# Patient Record
Sex: Female | Born: 1965
Health system: Southern US, Community
[De-identification: ages and names within clinical notes are randomized; demographics above are authoritative.]

## PROBLEM LIST (undated history)

## (undated) DIAGNOSIS — M1990 Unspecified osteoarthritis, unspecified site: Secondary | ICD-10-CM

## (undated) DIAGNOSIS — J189 Pneumonia, unspecified organism: Secondary | ICD-10-CM

## (undated) DIAGNOSIS — J90 Pleural effusion, not elsewhere classified: Secondary | ICD-10-CM

## (undated) DIAGNOSIS — Z7189 Other specified counseling: Secondary | ICD-10-CM

## (undated) DIAGNOSIS — K219 Gastro-esophageal reflux disease without esophagitis: Secondary | ICD-10-CM

## (undated) DIAGNOSIS — C3492 Malignant neoplasm of unspecified part of left bronchus or lung: Secondary | ICD-10-CM

## (undated) DIAGNOSIS — K5900 Constipation, unspecified: Secondary | ICD-10-CM

## (undated) DIAGNOSIS — D649 Anemia, unspecified: Secondary | ICD-10-CM

## (undated) DIAGNOSIS — Z8719 Personal history of other diseases of the digestive system: Secondary | ICD-10-CM

## (undated) DIAGNOSIS — I10 Essential (primary) hypertension: Secondary | ICD-10-CM

## (undated) DIAGNOSIS — K5090 Crohn's disease, unspecified, without complications: Secondary | ICD-10-CM

## (undated) DIAGNOSIS — R0600 Dyspnea, unspecified: Secondary | ICD-10-CM

## (undated) HISTORY — PX: COLONOSCOPY: SHX174

## (undated) HISTORY — PX: ABDOMINAL HYSTERECTOMY: SHX81

## (undated) HISTORY — DX: Malignant neoplasm of unspecified part of left bronchus or lung: C34.92

## (undated) HISTORY — PX: BREAST EXCISIONAL BIOPSY: SUR124

## (undated) HISTORY — DX: Other specified counseling: Z71.89

---

## 1997-07-21 ENCOUNTER — Ambulatory Visit (HOSPITAL_COMMUNITY): Admission: RE | Admit: 1997-07-21 | Discharge: 1997-07-21 | Payer: Self-pay | Admitting: *Deleted

## 1998-12-17 ENCOUNTER — Other Ambulatory Visit: Admission: RE | Admit: 1998-12-17 | Discharge: 1998-12-17 | Payer: Self-pay | Admitting: *Deleted

## 2000-02-20 ENCOUNTER — Other Ambulatory Visit: Admission: RE | Admit: 2000-02-20 | Discharge: 2000-02-20 | Payer: Self-pay | Admitting: *Deleted

## 2000-07-11 ENCOUNTER — Emergency Department (HOSPITAL_COMMUNITY): Admission: EM | Admit: 2000-07-11 | Discharge: 2000-07-11 | Payer: Self-pay | Admitting: *Deleted

## 2000-10-22 ENCOUNTER — Ambulatory Visit (HOSPITAL_COMMUNITY): Admission: RE | Admit: 2000-10-22 | Discharge: 2000-10-22 | Payer: Self-pay | Admitting: Neurology

## 2001-06-14 ENCOUNTER — Other Ambulatory Visit: Admission: RE | Admit: 2001-06-14 | Discharge: 2001-06-14 | Payer: Self-pay | Admitting: *Deleted

## 2002-01-24 ENCOUNTER — Inpatient Hospital Stay (HOSPITAL_COMMUNITY): Admission: RE | Admit: 2002-01-24 | Discharge: 2002-01-28 | Payer: Self-pay | Admitting: *Deleted

## 2002-01-24 ENCOUNTER — Encounter (INDEPENDENT_AMBULATORY_CARE_PROVIDER_SITE_OTHER): Payer: Self-pay

## 2002-08-08 ENCOUNTER — Other Ambulatory Visit: Admission: RE | Admit: 2002-08-08 | Discharge: 2002-08-08 | Payer: Self-pay | Admitting: *Deleted

## 2003-03-10 ENCOUNTER — Ambulatory Visit (HOSPITAL_COMMUNITY): Admission: RE | Admit: 2003-03-10 | Discharge: 2003-03-10 | Payer: Self-pay | Admitting: Obstetrics and Gynecology

## 2004-01-09 ENCOUNTER — Emergency Department (HOSPITAL_COMMUNITY): Admission: EM | Admit: 2004-01-09 | Discharge: 2004-01-09 | Payer: Self-pay | Admitting: Emergency Medicine

## 2004-06-16 ENCOUNTER — Emergency Department (HOSPITAL_COMMUNITY): Admission: EM | Admit: 2004-06-16 | Discharge: 2004-06-16 | Payer: Self-pay | Admitting: Emergency Medicine

## 2005-10-12 ENCOUNTER — Encounter (INDEPENDENT_AMBULATORY_CARE_PROVIDER_SITE_OTHER): Payer: Self-pay | Admitting: *Deleted

## 2005-10-13 ENCOUNTER — Inpatient Hospital Stay (HOSPITAL_COMMUNITY): Admission: RE | Admit: 2005-10-13 | Discharge: 2005-10-17 | Payer: Self-pay | Admitting: Obstetrics and Gynecology

## 2007-05-17 ENCOUNTER — Emergency Department (HOSPITAL_COMMUNITY): Admission: EM | Admit: 2007-05-17 | Discharge: 2007-05-17 | Payer: Self-pay | Admitting: Emergency Medicine

## 2008-06-05 ENCOUNTER — Encounter: Admission: RE | Admit: 2008-06-05 | Discharge: 2008-06-05 | Payer: Self-pay | Admitting: Obstetrics and Gynecology

## 2008-06-08 ENCOUNTER — Encounter: Admission: RE | Admit: 2008-06-08 | Discharge: 2008-06-08 | Payer: Self-pay | Admitting: Obstetrics and Gynecology

## 2010-01-28 ENCOUNTER — Emergency Department (HOSPITAL_COMMUNITY)
Admission: EM | Admit: 2010-01-28 | Discharge: 2010-01-28 | Payer: Self-pay | Source: Home / Self Care | Admitting: Emergency Medicine

## 2010-02-16 ENCOUNTER — Encounter: Payer: Self-pay | Admitting: Obstetrics and Gynecology

## 2010-03-15 ENCOUNTER — Emergency Department (HOSPITAL_COMMUNITY): Payer: Self-pay

## 2010-03-15 ENCOUNTER — Emergency Department (HOSPITAL_COMMUNITY)
Admission: EM | Admit: 2010-03-15 | Discharge: 2010-03-15 | Disposition: A | Discharge status: Home / Self Care | Payer: Self-pay | Attending: Emergency Medicine | Admitting: Emergency Medicine

## 2010-03-15 ENCOUNTER — Encounter (HOSPITAL_COMMUNITY): Payer: Self-pay | Admitting: Radiology

## 2010-03-15 DIAGNOSIS — R51 Headache: Secondary | ICD-10-CM | POA: Insufficient documentation

## 2010-03-15 DIAGNOSIS — M542 Cervicalgia: Secondary | ICD-10-CM | POA: Insufficient documentation

## 2010-03-15 DIAGNOSIS — H53149 Visual discomfort, unspecified: Secondary | ICD-10-CM | POA: Insufficient documentation

## 2010-06-14 NOTE — Discharge Summary (Signed)
Nancy Moore, Nancy Moore                 ACCOUNT NO.:  [id number]   MEDICAL RECORD NO.:  [id number]          PATIENT TYPE:  INP   LOCATION:  9309                          FACILITY:  [facility]   PHYSICIAN:  Diona Foley, M.D.   DATE OF BIRTH:  [birth year]   DATE OF ADMISSION:  10/13/2005  DATE OF DISCHARGE:  10/17/2005                                 DISCHARGE SUMMARY   ADMISSION DIAGNOSIS:  Symptomatic uterine fibroids, endometriosis.   DISCHARGE DIAGNOSIS:  Symptomatic uterine fibroids, endometriosis, status  post total abdominal hysterectomy.   OPERATIVE FINDINGS:  Markedly enlarged uterus with multiple uterine fibroids  and serosal adhesions, normal appearing right tube and ovary, left tube and  ovary adherent to the cornua with very tortuous fallopian tube, normal  appearing cervix.   HISTORY OF PRESENT ILLNESS:  Please see admission history and physical for  details.  Briefly, this is a 45 year old African American female with a  history of symptomatic uterine fibroids, significant pelvic pain, and  dysmenorrhea.  The patient was counseled on her options for further  management and it was decided to proceed with definitive procedure.   HOSPITAL COURSE:  The patient underwent a total abdominal hysterectomy on  October 13, 2005, with the findings noted above.  Postoperatively, the  patient did very well.  Given the size of the uterus, there was a large  amount of blood loss during the procedure.  On postoperative day two, the  patient was symptomatic with light headedness  and dizziness and her  hemoglobin was 5.9.  The patient was subsequently counseled and consented  for blood transfusion and received 2 units of packed red blood cells.  She  continued to improve throughout her hospital stay.  On postoperative day  four, she was ambulating well without light headedness  or dizziness.  Her  pain was controlled with oral pain medications.  She was afebrile throughout  her  hospitalization.  She was passing flatus, voiding, and tolerating a  regular diet.  She was subsequently discharged to home on postoperative day  four in improved condition.   DISPOSITION:  To home.   CONDITION:  Improved.   LABORATORY DATA:  Preoperative hemoglobin on September 14 is 13, hematocrit  39.1, platelets 243, white count 8.6.  Postoperative day two, hemoglobin  5.9, hematocrit 17.6, platelets 193, white count 10.2.  Postoperative day  three, following a transfusion of 2 units, hemoglobin 7.9, hematocrit 23,  platelets 193, white count 10.3.  Sodium 136, potassium 3.3, chloride 103,  CO2 26, glucose 133, BUN 2, creatinine 0.8, calcium 7.9.  Urinalysis  negative.   FOLLOW UP:  The patient will follow up on Monday, September 24, to have her  staples removed in the office.  She will also follow up in four weeks for  routine postoperative visit.   DISCHARGE INSTRUCTIONS:  The patient is to call for a fever greater than  100.1, increasing abdominal pain, vomiting, vaginal bleeding, or any  drainage from her incision.   DISCHARGE MEDICATIONS:  Percocet 1-2 tabs p.o. q.4-6h. p.r.n. pain,  ibuprofen 800 mg p.o. q.8h. p.r.n.  pain, and ferrous sulfate 325 mg p.o.  b.i.d.      Diona Foley, M.D.  Electronically Signed     JW/MEDQ  D:  10/17/2005  T:  10/19/2005  Job:  [id number]

## 2010-06-14 NOTE — Procedures (Signed)
[facility]  Patient:    Nancy Moore, Nancy Moore Visit Number: [id number] MRN: [id number]          Service Type: OUT Location: [place] Attending Physician:  Blanch Media Dictated by:   Elvia Collum, M.D. Proc. Date: 10/22/00 Admit Date:  10/22/2000                             Procedure Report  DATE OF BIRTH:  1965/06/30.  PROCEDURE:  Diagnostic/therapeutic lumbar puncture.  INDICATION:  Tinnitus, suspected idiopathic intracranial hypertension.  OPERATOR:  Elvia Collum, M.D.  DESCRIPTION OF PROCEDURE:  Informed consent was obtained and signed after the indications, benefits, and risks of the procedure were discussed with the patient and she agreed to proceed.  The patient was prepped and draped in the usual sterile fashion after being placed in the right lateral decubitus position.  Local anesthesia was obtained with 2 cc of lidocaine.  A 20-gauge spinal needle was inserted into the L3-4 interspace and was advanced until clear CSF was obtained.  Opening pressure was measured and was 185 mmH2O.  CSF was gathered in four tubes and sent to the lab for the following analyses: Tube #1, cell count and differential; tube #2, protein and glucose; tube #3 and 4, hold.  Additional fluid was subsequently drained to bring the closing pressure down to 115 mmH2O.  The needle was withdrawn and hemostasis obtained. No immediate complications were noted.  The patient was advised to remain supine for an hour, at which point she would be discharged home, where she was advised to remain supine for the remainder of the afternoon.  She was also advised to call if she developed a headache or any other complications from the procedure.  She did report that after the procedure the pulsatile tinnitus sound in her ear had improved.  She is to call over the next week with a status report on this. Dictated by:   Elvia Collum, M.D. Attending Physician:  Blanch Media DD:  10/22/00 TD:  10/22/00 Job: [id number]

## 2010-06-14 NOTE — Op Note (Signed)
NAME:  NIKAYLA, MADARIS                           ACCOUNT NO.:  [id number]   MEDICAL RECORD NO.:  [id number]                   PATIENT TYPE:  INP   LOCATION:  X001                                 [facility]AC[facility]  [facility]   PHYSICIAN:  Freddie Apley, M.D.            DATE OF BIRTH:  December 21, 1965   DATE OF PROCEDURE:  01/24/2002  DATE OF DISCHARGE:                                 OPERATIVE REPORT   PREOPERATIVE DIAGNOSIS:  Enlarging uterine myomas, uterus 20 weeks in size.   POSTOPERATIVE DIAGNOSES:  1. Enlarging uterine myomas.  2. Adhesive disease of the sacrum to the posterior uterus and left adnexa.  3. Left tubal phimosis.   PROCEDURES:  1. Exploratory laparotomy.  2. Extensive lysis of adhesions.  3. Multiple myomectomy.   SURGEON:  Freddie Apley, M.D.   ASSISTANT:  Blair Dolphin. Rosana Hoes, M.D.   ANESTHESIA:  General endotracheal.   ESTIMATED BLOOD LOSS:  1000 cc.   URINE OUTPUT:  350 cc.   FLUIDS REPLACED:  5000 cc of crystalloid.   COMPLICATIONS:  None.   SPECIMENS:  Multiple uterine myomas.   INDICATION FOR PROCEDURE:  The patient is 45 years old.  She is gravida 1,  para 0.  She is recently married.  She is status post multiple myomectomy in  1996.  In May 2003 she was found to have significant growth of her uterine  myomas and there was evidence of a uterine submucosal myoma.  Repeat  sonogram shows an increase in the size of these uterine myomas over the last  six months, and her uterine size was 20 cm.  For this reason she was treated  with Lupron in preparation for the surgery over the last three months and is  here today for myomectomy.   FINDINGS:  There were no adhesions to the anterior surface of the uterus.  The posterior surface of the uterus shows adhesions from the sacrum and  rectosigmoid to the posterior uterine wall and to the left adnexa.  The left  tube was mildly phimotic and distorted onto the surface of the uterus.  The  ovary on this side was  normal.  The tube on the right looked normal and was  in normal position, and the ovary on that side also looked normal.  There  was no visible endometriosis today.   DESCRIPTION OF PROCEDURE:  The patient was brought to the operating room  with an IV in place.  She received 1 g of Ancef in the holding area.  PAS  stockings were placed thigh-high on her lower extremities.  She was placed  supine on the OR table and general anesthesia was administered with a single  intubation.  She was then placed in a frogleg position and the anterior  abdominal wall, perineum, and vagina were prepped with a solution of  Hibiclens.  A Foley catheter was sterilely inserted into the bladder.  A  bivalve speculum exposed the cervix, which was grasped with a single-tooth  tenaculum.  A #8 pediatric Foley was introduced into the endometrial canal  and the balloon was inflated to 3 cc.  The balloon was tested and felt to be  in place.  It was connected to an irrigation system with methylene blue so  that we could test the patency of our endometrium during the case.   The patient's previous Pfannenstiel incision was used for this case.  The  incision was infiltrated with 0.5% Marcaine.  The scar was not resected.  We  carried down through the scar to the subcutaneous tissues until the fascia  was encountered.  The fascia was then opened in a smile-like fashion to the  lateral extent of the incisions.  By grasping the anterior fascia, it was  able to be dissected free from the rectus muscles superiorly and inferiorly.  In an attempt to obtain a bigger incision, the fascia was opened even  further on the patient's left and a defect was created in the fascia.  This  was closed with 2-0 Vicryl sutures.  The rectus muscles were then divided in  the midline.  The peritoneum was tended and opened atraumatically.  There  was no evidence of ascites.  Inspecting the peritoneal cavity,  I found no  evidence of  endometriosis.  The patient's uterus was at this time about 16  weeks in size.  She had multiple uterine myomas, and it was very difficult  to lift the uterus out of the pelvis.  I was able to lift it anterior so  that I could see the adhesions, and these very filmy adhesions were taken  down with sharp dissection.  Where they approached the left fallopian tube,  great care was taken not to damage the fallopian tube, which was adherent to  the anterior surface of the uterine fundus.  Once these adhesions had been  taken down, a lap was placed between the posterior uterus and the sacrum to  lift the uterus slightly out of the incision.  An attempt was made to use a  Balfour retractor but this was not helpful, and so it was removed.   Dilute Pitressin solution was created using 10 mg in 30 cc.  This was  injected into the anterior surface of the uterus.  Bovie cautery using a  needle tip was used to open the midline of the uterus.  At the end of the  procedure had actually opened the entire anterior surface of the uterus, but  this first incision was really just in the top of the anterior fundus.  We  dissected down to the level that we could find myomas, and approximately  four myomas were removed through this incision.  One of these was  approximately 5 cm in size, the rest were smaller and embedded in the deeper  myometrium.  The largest myoma was in the lower uterine segment.  After  placing the Pitressin solution over this incision, the uterus was layered  back into the abdominal cavity and the Bovie was used to open this incision.  It was carried down to the layer of the myomas, and two myomas were removed  from this area.  Several smaller myomas could be felt between these two  areas and because I was unable to reach them through either of the separate  defects, the entire anterior wall was opened.  I was then able to retrieve three or four  more small uterine myomas using this site.   The large defects  were closed with interrupted 0 Vicryl sutures.   There was a strong impression that I was in the endometrial cavity but  despite the fact that we had placed a Foley catheter which was still in  place, no methylene blue was extruded.  Later when we took down her drapes,  the methylene blue had been saturating the coverlets beneath her buttocks.  Clearly, even though the Foley catheter was in the uterus, the dye did not  pass into the uterus.   The muscle edges were brought together with a running 0 Vicryl suture.  The  serosa over the underlying muscle was brought together with a 3-0 Monocryl.  Interceed was layered over the uterine incision.  We irrigated the  peritoneal cavity and removed the laps.  The fascia was then closed over the  uterine incision using 0 Vicryl suture running from side to side,  tying  both in the middle.  Subcutaneous tissues were irrigated,  skin staples were  applied.                                                Freddie Apley, M.D.    MAJ/MEDQ  D:  01/24/2002  T:  01/24/2002  Job:  [id number]   cc:   [place] B. Rosana Hoes, M.D.  [address]  [place]  [place] [address]  Fax: [phone]   [name], M.D.  [address] [place]  [place] [address]  Fax: [phone]

## 2010-06-14 NOTE — Discharge Summary (Signed)
NAME:  Nancy Moore, Nancy Moore                           ACCOUNT NO.:  [id number]   MEDICAL RECORD NO.:  [id number]                   PATIENT TYPE:  INP   LOCATION:  0448                                 FACILITY:  [facility]   PHYSICIAN:  Freddie Apley, M.D.            DATE OF BIRTH:  24-Apr-1965   DATE OF ADMISSION:  01/24/2002  DATE OF DISCHARGE:  01/28/2002                                 DISCHARGE SUMMARY   ADMISSION DIAGNOSIS:  Enlarging myomatous uterus.   DISCHARGE DIAGNOSES:  1. Enlarging myomatous uterus.  2. Anemia.   CONDITION AT THE TIME OF DISCHARGE:  Stable.   HISTORY OF PRESENT ILLNESS:  For details of the patient's admission history  and physical, please see the note transcribed and dated January 24, 2002.  Briefly, the patient is 45 years old.  She is a gravida 1 para 0.  She is  status post multiple myomectomies in 1996 and has been having an increased  size of her uterus with multiple uterine myomas recently.  These are  documented on sonogram to be over 20 cm.  She received Lupron to shrink the  fibroid and was brought to the hospital for an operative procedure to remove  these myomas.   HOSPITAL COURSE:  The patient was taken to the operating room on the day of  admission - January 24, 2002.  Under general anesthesia, exploratory  laparotomy and multiple myomectomies were performed.  The patient's  operation was complicated by extensive bleeding from the uterus where the  myomas were removed.  She was stable in the postoperative period with good  urine output and good vital signs.  However, her hemoglobin never responded  and even on postoperative day #3 was as low as 6.1.  She was symptomatic and  made the decision after counseling with her that she needed to be  transfused, and she received 2 units of blood on January 27, 2002.  On the  morning on January 2 she was stable and able to be discharged with  prescriptions for pain medication.   MEDICATIONS:  1.  Tylox one q.4h. or Darvocet one q.4h.  2. Peri-Colace for the days that she is on Tylox.  3. She has been given Chromagen which she will take also twice a day.    DISPOSITION:  She has an appointment scheduled in my office in one month and  she also has a discharge instruction sheet outlining what she should do if  she has any problems.                                               Freddie Apley, M.D.    MAJ/MEDQ  D:  01/28/2002  T:  01/28/2002  Job:  [id number]   cc:   [place]  Lysle Rubens, M.D.  [place] [place]  [place] [address]  Fax: [phone]

## 2010-06-14 NOTE — Op Note (Signed)
Nancy Moore, BHAT                 ACCOUNT NO.:  [id number]   MEDICAL RECORD NO.:  [id number]          PATIENT TYPE:  INP   LOCATION:  9309                          FACILITY:  [facility]   PHYSICIAN:  Diona Foley, M.D.   DATE OF BIRTH:  14-Sep-1965   DATE OF PROCEDURE:  10/13/2005  DATE OF DISCHARGE:                                 OPERATIVE REPORT   PREOPERATIVE DIAGNOSES:  1. Symptomatic uterine fibroids.  2. Endometriosis.  3. Menometrorrhagia.   POSTOPERATIVE DIAGNOSES:  1. Symptomatic uterine fibroids.  2. Endometriosis.  3. Menometrorrhagia.   PROCEDURE:  Total abdominal hysterectomy with left salpingo-oophorectomy and  lysis of adhesions and resection of endometriosis.   SURGEON:  Diona Foley, M.D.   ASSISTANT:  Freddie Apley, M.D.   COMPLICATIONS:  None.   ANESTHESIA:  General.   ESTIMATED BLOOD LOSS:  1200 mL.   OPERATIVE FINDINGS:  Markedly enlarged fibroid uterus, estimated weight 1600  g, normal appearing cervix, adhesions involving both adnexa with normal  appearing right ovary and fallopian tube, left hydrosalpinx, multiple  endometriosis implants in the posterior cul-de-sac along the rectosigmoid  and along the bladder, adhesions involving the colon to the uterine fundus,  peritoneal inclusion cyst.   SPECIMENS:  Uterus with adherent left tube and ovary, multiple fibroids,  cervix, peritoneum and peritoneal inclusion cyst all sent to pathology.   INDICATIONS:  This is a 45 year old gravida 1, para 0-0-1-0 African American  female who has a long history of symptomatic uterine fibroids with  significant menometrorrhagia as well as pelvic pain and infertility.  The  patient has undergone two prior myomectomies for her fibroids at which time  endometriosis was identified during one of her surgeries, a total of 30+  fibroids were removed.  The patient has been on and off Lupron throughout  the last 5+ years and her bleeding has only been partially  controlled with  birth control pills.  The patient has been evaluated for infertility with an  HSG showing multiple submucosal fibroids and she has undergone consultation  with reproductive endocrinology regarding assisted reproduction.  The  patient's symptoms have worsened to the point where she is now missing work  on occasion due to pain and bleeding and she presents today for definitive  management with hysterectomy.  The patient voices understanding that after  this procedure she will no longer be able to attempt childbearing.   Prior to procedure, the risks, benefits and alternatives of the procedure  have been discussed with the patient in detail.  We discussed the risks  which include, but are not limited to, hemorrhage requiring transfusion,  injury to the bowel, the bladder, the ureters or other organs which could  require additional surgery at the time of this procedure or in the future.  We discussed the risk of infection.  We discussed the risk of the deep  venous thrombosis, possible pulmonary embolus and anesthesia related  complications.  The patient voices understanding of all the above and  desires to proceed.  Informed consent has been obtained and all questions  answered before proceeding to  the OR.   PROCEDURE:  The patient was taken to the operating room where she was given  a general anesthetic.  She was then prepped and draped in the usual sterile  fashion and placed in the supine position.  A Foley catheter was placed.  Bimanual exam was performed which revealed a 16-week size uterus that was  laterally mobile with no posterior cul-de-sac nodularity.  Given this  finding, the decision was made to proceed through the patient's prior  Pfannenstiel incision which had been used twice previously.  The  Pfannenstiel skin incision was made with the scalpel after injecting 20 mL  of 0.25% plain Marcaine in the subcutaneous space.  This incision was  carried down sharply  to the fascia.  The fascia was then incised in the  midline and fascial incision was then extended laterally with Mayo scissors.  The patient had a prior hernia repair at the right aspect of her incision,  approximately 2 cm of mesh were encountered making the fascial incision.  Once the fascial incision was made, the superior and inferior aspects were  grasped with Kocher clamps, elevated and the underlying rectus muscles were  dissected off with both sharp and blunt dissection.  The muscles and  peritoneum were then separated in the midline using very careful sharp  dissection.  The peritoneal incision was then extended with good  visualization of the bladder.  A 2 cm peritoneal inclusion cyst was  encountered that appeared to be possibly adjacent to the urachus.  This was  excised and sent to pathology labeled as peritoneal cyst.  In an effort to  obtain adequate visualization and mobilization of the fibroid uterus, the  rectus muscles on both sides were transected for a distance of 1 cm.  At  this point the uterus was inspected.  A hand was then inserted behind the  uterus.  There was some filmy adhesions involving the bowel to the posterior  aspect of the fundus.  The right ovary and tube were able be visualized.  The right round ligament was then doubly suture ligated and was transected.  The utero-ovarian ligament on the right side was doubly clamped, transected  and was suture ligated with good hemostasis.  At this point, attention was  then turned to the left side.  There were multiple subserosal fibroids  varying from 1 to 3 cm in diameter present over the fundus and adherent to  these were thin adhesions adjacent to the bowel.  Using very careful sharp  and blunt dissection, these adhesions were transected and the uterus was  able to be more thoroughly manipulated.  Attention was then turned to the  left side where the round ligament was able to be identified.  The left fallopian  tube was markedly dilated and the ovary appeared to be wrapped  beneath the fallopian tube.  The left ovary could never actually be  identified due to the significant amount of adhesive disease involving the  left adnexa.  The left round ligament was then doubly ligated and transected  with good hemostasis.  Using very careful dissection technique, the  infundibulopelvic ligament on the left side was able to be identified.  This  was doubly clamped, transected and was suture ligated with excellent  hemostasis.  At this point, additional adhesiolysis was performed on the  posterior aspect of the fundus to help free the uterus.  Once the uterus was  mobile, it was able to be delivered up through the incision.  The uterine  arteries on both sides were carefully skeletonized and were doubly clamped,  transected and suture ligated with multiple bites.  Once the uterine  vasculature was secured, the uterus was then amputated at the level of the  cervix.  The uterus was then sent to pathology and was weighed in the  operating room, weighed approximately 1600 g.  The cervix was grasped with  two Kocher clamps.  The bladder, which had already been dissected and off of  the lower uterine segment and displaced inferiorly, was further dissected  off with sharp dissection.  On the posterior aspect and the uterosacral  ligaments, there were multiple endometrial nodules present and significant  scarring.  In order to access the uterosacral ligaments, these adhesions  were taken down with sharp dissection and the cardinal ligaments on both  sides were then clamped, transected and suture ligated and were hemostatic.  The uterosacral ligaments were then clamped, transected and suture ligated.  The vaginal cuff angles were then clamped, transected and suture ligated and  the cervix was then amputated with the curved hysterectomy scissors.  The  remaining portion of the vaginal cuff was closed with interrupted  figure-of-  eight Vicryl sutures and the cuff was hemostatic.  The pelvis, peritoneum  was carefully inspected and any areas of bleeding were cauterized with the  Bovie.  The rectosigmoid was carefully inspected and was intact.  There were  no evidence of a serosal defect.  Any areas of bleeding were carefully  cauterized with the Bovie.  The bladder was carefully inspected along the  bladder peritoneum.  There was a large amount of endometriosis present and  approximately 4 cm diameter piece of peritoneum was removed and sent to  pathology, labeled as peritoneum in an effort to excise the endometriosis.  The round ligaments, uterine pedicles, the right utero-ovarian and the left  infundibulopelvic ligament were carefully inspected and were hemostatic.  The ureters were noted to be coursing normally and of normal caliber on both  sides.  The bowel was then run and there was no evidence of any compromise  or injury to the bowel.  The pelvis was then irrigated copiously with warm normal saline.  Any areas of bleeding were cauterized with the Bovie.  Gelfoam and thrombin were placed over the vaginal cuff and over the area  where the peritoneum had been resected for additional hemostasis.  At this  point all sponge, lap and instrument counts were correct x1.  The rectus  muscles were inspected and any areas of bleeding were hemostatic.  The  subfascial areas were inspected and areas of bleeding were hemostatic.  The  fascial incision was then closed with a running PDS suture, except at the  right angle which was closed with 0 Prolene along the area of the previously  placed mesh.  The subcutaneous space was then irrigated.  Any areas of  bleeding were cauterized with the Bovie.  The skin was then closed with  staples.   The patient tolerated the procedure very well.  All sponge, lap, needle and  instrument counts were correct x2.  The patient was taken to the recovery  room awake and in  stable condition.  There were no complications.      Diona Foley, M.D.  Electronically Signed     JW/MEDQ  D:  10/13/2005  T:  10/14/2005  Job:  [id number]

## 2010-07-22 ENCOUNTER — Inpatient Hospital Stay (INDEPENDENT_AMBULATORY_CARE_PROVIDER_SITE_OTHER)
Admission: RE | Admit: 2010-07-22 | Discharge: 2010-07-22 | Disposition: A | Discharge status: Home / Self Care | Payer: 59 | Source: Ambulatory Visit | Attending: Emergency Medicine | Admitting: Emergency Medicine

## 2010-07-22 DIAGNOSIS — L0292 Furuncle, unspecified: Secondary | ICD-10-CM

## 2010-07-22 DIAGNOSIS — R609 Edema, unspecified: Secondary | ICD-10-CM

## 2010-07-22 LAB — POCT URINALYSIS DIP (DEVICE)
Bilirubin Urine: NEGATIVE
Glucose, UA: NEGATIVE mg/dL
Hgb urine dipstick: NEGATIVE
Ketones, ur: NEGATIVE mg/dL
Leukocytes, UA: NEGATIVE
Nitrite: NEGATIVE
Protein, ur: NEGATIVE mg/dL
Specific Gravity, Urine: 1.02 (ref 1.005–1.030)
Urobilinogen, UA: 1 mg/dL (ref 0.0–1.0)
pH: 5 (ref 5.0–8.0)

## 2010-07-22 LAB — POCT I-STAT, CHEM 8
BUN: 8 mg/dL (ref 6–23)
Calcium, Ion: 1.23 mmol/L (ref 1.12–1.32)
Chloride: 99 mEq/L (ref 96–112)
Creatinine, Ser: 0.9 mg/dL (ref 0.50–1.10)
Glucose, Bld: 90 mg/dL (ref 70–99)
HCT: 43 % (ref 36.0–46.0)
Hemoglobin: 14.6 g/dL (ref 12.0–15.0)
Potassium: 3.9 mEq/L (ref 3.5–5.1)
Sodium: 140 mEq/L (ref 135–145)
TCO2: 30 mmol/L (ref 0–100)

## 2011-01-01 ENCOUNTER — Other Ambulatory Visit: Payer: Self-pay | Admitting: Internal Medicine

## 2011-01-01 DIAGNOSIS — Z1231 Encounter for screening mammogram for malignant neoplasm of breast: Secondary | ICD-10-CM

## 2011-02-03 ENCOUNTER — Ambulatory Visit: Payer: 59

## 2011-02-21 ENCOUNTER — Ambulatory Visit: Payer: 59

## 2011-05-30 ENCOUNTER — Ambulatory Visit: Payer: 59

## 2011-09-15 ENCOUNTER — Ambulatory Visit: Payer: 59

## 2011-09-26 ENCOUNTER — Ambulatory Visit: Payer: 59

## 2011-09-30 ENCOUNTER — Emergency Department (HOSPITAL_COMMUNITY)
Admission: EM | Admit: 2011-09-30 | Discharge: 2011-09-30 | Disposition: A | Discharge status: Home / Self Care | Payer: 59 | Source: Home / Self Care | Attending: Family Medicine | Admitting: Family Medicine

## 2011-09-30 ENCOUNTER — Encounter (HOSPITAL_COMMUNITY): Payer: Self-pay | Admitting: *Deleted

## 2011-09-30 DIAGNOSIS — J029 Acute pharyngitis, unspecified: Secondary | ICD-10-CM

## 2011-09-30 HISTORY — DX: Essential (primary) hypertension: I10

## 2011-09-30 LAB — POCT RAPID STREP A: Streptococcus, Group A Screen (Direct): NEGATIVE

## 2011-09-30 NOTE — ED Notes (Signed)
Pt reports sore throat and possible exposure to strep throat since Sunday. All over body aches and general malaise.

## 2011-09-30 NOTE — ED Provider Notes (Signed)
History     CSN: [id number]  Arrival date & time 09/30/11  1750   First MD Initiated Contact with Patient 09/30/11 1759      Chief Complaint  Patient presents with  . Sore Throat    (Consider location/radiation/quality/duration/timing/severity/associated sxs/prior treatment) Patient is a 46 y.o. female presenting with pharyngitis. The history is provided by the patient.  Sore Throat This is a new problem. The current episode started more than 2 days ago. The problem has not changed since onset.The symptoms are aggravated by swallowing.    Past Medical History  Diagnosis Date  . Hypertension     History reviewed. No pertinent past surgical history.  Family History  Problem Relation Age of Onset  . Family history unknown: Yes    History  Substance Use Topics  . Smoking status: Never Smoker   . Smokeless tobacco: Not on file  . Alcohol Use: Yes     socially    OB History    Grav Para Term Preterm Abortions TAB SAB Ect Mult Living                  Review of Systems  Constitutional: Negative.   HENT: Positive for sore throat. Negative for congestion, rhinorrhea and postnasal drip.   Respiratory: Negative for cough.   Skin: Negative.     Allergies  Percocet  Home Medications   Current Outpatient Rx  Name Route Sig Dispense Refill  . VALSARTAN 40 MG PO TABS Oral Take by mouth daily.      BP 145/76  Pulse 78  Temp 97.8 F (36.6 C) (Oral)  Resp 18  SpO2 100%  LMP 03/15/2010  Physical Exam  Nursing note and vitals reviewed. Constitutional: She is oriented to person, place, and time. She appears well-developed and well-nourished.  HENT:  Head: Normocephalic.  Right Ear: External ear normal.  Left Ear: External ear normal.  Mouth/Throat: Oropharynx is clear and moist.  Neck: Normal range of motion. Neck supple.  Cardiovascular: Regular rhythm and normal heart sounds.   Pulmonary/Chest: Effort normal and breath sounds normal.  Lymphadenopathy:   She has cervical adenopathy.  Neurological: She is alert and oriented to person, place, and time.  Skin: Skin is warm and dry.    ED Course  Procedures (including critical care time)   Labs Reviewed  POCT RAPID STREP A ([facility] ONLY)   No results found.   1. Pharyngitis, acute       MDM          Linna Hoff, MD 09/30/11 1901

## 2011-11-17 ENCOUNTER — Emergency Department (INDEPENDENT_AMBULATORY_CARE_PROVIDER_SITE_OTHER)
Admission: EM | Admit: 2011-11-17 | Discharge: 2011-11-17 | Disposition: A | Discharge status: Home / Self Care | Payer: Self-pay | Source: Home / Self Care | Attending: Emergency Medicine | Admitting: Emergency Medicine

## 2011-11-17 ENCOUNTER — Encounter (HOSPITAL_COMMUNITY): Payer: Self-pay | Admitting: *Deleted

## 2011-11-17 DIAGNOSIS — S139XXA Sprain of joints and ligaments of unspecified parts of neck, initial encounter: Secondary | ICD-10-CM

## 2011-11-17 MED ORDER — CYCLOBENZAPRINE HCL 10 MG PO TABS: 10.0000 mg | ORAL_TABLET | Freq: Two times a day (BID) | ORAL | Status: DC | PRN

## 2011-11-17 MED ORDER — IBUPROFEN 800 MG PO TABS: 800.0000 mg | ORAL_TABLET | Freq: Three times a day (TID) | ORAL | Status: AC | PRN
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
## 2012-01-13 ENCOUNTER — Other Ambulatory Visit: Payer: Self-pay | Admitting: Internal Medicine

## 2012-01-13 DIAGNOSIS — Z1231 Encounter for screening mammogram for malignant neoplasm of breast: Secondary | ICD-10-CM

## 2012-02-16 ENCOUNTER — Ambulatory Visit: Payer: Self-pay

## 2012-02-24 ENCOUNTER — Ambulatory Visit
Admission: RE | Admit: 2012-02-24 | Discharge: 2012-02-24 | Disposition: A | Discharge status: Home / Self Care | Payer: 59 | Source: Ambulatory Visit | Attending: Internal Medicine | Admitting: Internal Medicine

## 2012-02-24 DIAGNOSIS — Z1231 Encounter for screening mammogram for malignant neoplasm of breast: Secondary | ICD-10-CM

## 2012-03-05 ENCOUNTER — Ambulatory Visit: Payer: 59

## 2012-09-01 ENCOUNTER — Other Ambulatory Visit: Payer: Self-pay | Admitting: Internal Medicine

## 2012-09-01 ENCOUNTER — Ambulatory Visit
Admission: RE | Admit: 2012-09-01 | Discharge: 2012-09-01 | Disposition: A | Discharge status: Home / Self Care | Payer: 59 | Source: Ambulatory Visit | Attending: Internal Medicine | Admitting: Internal Medicine

## 2012-09-01 ENCOUNTER — Ambulatory Visit: Admission: RE | Admit: 2012-09-01 | Payer: 59 | Source: Ambulatory Visit

## 2012-09-01 DIAGNOSIS — Z1231 Encounter for screening mammogram for malignant neoplasm of breast: Secondary | ICD-10-CM

## 2012-11-27 ENCOUNTER — Encounter: Payer: 59 | Attending: Internal Medicine | Admitting: *Deleted

## 2012-11-27 VITALS — Ht 66.0 in | Wt 215.1 lb

## 2012-11-27 DIAGNOSIS — Z713 Dietary counseling and surveillance: Secondary | ICD-10-CM | POA: Insufficient documentation

## 2012-11-27 NOTE — Progress Notes (Signed)
Subjective:     Patient ID: Nancy Moore, female   DOB: 1965/12/22, 47 y.o.   MRN: [id number]  HPI   Review of Systems    Objective:    Physical Exam  Patient was seen on 11/27/2012 for the [facility] Weight Loss Class at the [facility]. The following learning objectives were met by the patient during this class:   Describe healthy choices in each food group  Describe portion size of foods  Use plate method for meal planning  Demonstrate how to read Nutrition Facts food label  Set realistic goals for weight loss, diet changes, and physical activity.

## 2013-03-17 ENCOUNTER — Other Ambulatory Visit: Payer: Self-pay | Admitting: Internal Medicine

## 2013-03-17 DIAGNOSIS — R109 Unspecified abdominal pain: Secondary | ICD-10-CM

## 2013-03-29 ENCOUNTER — Other Ambulatory Visit (HOSPITAL_COMMUNITY): Payer: Self-pay | Admitting: Internal Medicine

## 2013-03-29 DIAGNOSIS — R109 Unspecified abdominal pain: Secondary | ICD-10-CM

## 2013-03-31 ENCOUNTER — Ambulatory Visit (HOSPITAL_COMMUNITY)
Admission: RE | Admit: 2013-03-31 | Discharge: 2013-03-31 | Disposition: A | Discharge status: Home / Self Care | Payer: 59 | Source: Ambulatory Visit | Attending: Internal Medicine | Admitting: Internal Medicine

## 2013-03-31 ENCOUNTER — Encounter (HOSPITAL_COMMUNITY): Payer: Self-pay

## 2013-03-31 ENCOUNTER — Other Ambulatory Visit: Payer: Self-pay

## 2013-03-31 DIAGNOSIS — M549 Dorsalgia, unspecified: Secondary | ICD-10-CM | POA: Insufficient documentation

## 2013-03-31 DIAGNOSIS — R109 Unspecified abdominal pain: Secondary | ICD-10-CM

## 2013-03-31 DIAGNOSIS — R1031 Right lower quadrant pain: Secondary | ICD-10-CM | POA: Insufficient documentation

## 2013-03-31 MED ORDER — IOHEXOL 300 MG/ML  SOLN
100.0000 mL | Freq: Once | INTRAMUSCULAR | Status: AC | PRN
  Administered 2013-03-31: 100 mL via INTRAVENOUS

## 2014-05-17 ENCOUNTER — Other Ambulatory Visit: Payer: Self-pay | Admitting: Internal Medicine

## 2014-05-17 ENCOUNTER — Ambulatory Visit
Admission: RE | Admit: 2014-05-17 | Discharge: 2014-05-17 | Disposition: A | Discharge status: Home / Self Care | Payer: 59 | Source: Ambulatory Visit | Attending: Internal Medicine | Admitting: Internal Medicine

## 2014-05-17 DIAGNOSIS — M79671 Pain in right foot: Secondary | ICD-10-CM

## 2014-06-21 ENCOUNTER — Ambulatory Visit: Payer: 59 | Admitting: Podiatry

## 2014-09-16 ENCOUNTER — Encounter: Payer: 59 | Attending: Internal Medicine | Admitting: Dietician

## 2014-09-16 DIAGNOSIS — Z713 Dietary counseling and surveillance: Secondary | ICD-10-CM | POA: Diagnosis present

## 2014-09-16 NOTE — Progress Notes (Signed)
Patient was seen on 09/16/14 for the Weight Loss Class at the [facility]. The following learning objectives were met by the patient during this class:   Describe healthy choices in each food group  Describe portion size of foods  Use plate method for meal planning  Demonstrate how to read Nutrition Facts food label  Set realistic goals for weight loss, diet changes, and physical activity.   Goals:  1. Make healthy food choices in each food group.  2. Reduce portion size of foods.  3. Increase fruit and vegetable intake.  4. Use plate method for meal planning.  5. Increase physical activity.    Handouts given:   1. Nutrition Strategies for Weight Loss   2. Meal plan/portion card   3. MyPlate Planner   4. Weight Management Recipe Resources   5. Bake, Broil, Grill   

## 2015-03-01 MED FILL — VALSARTAN 40 MG TABLET: 40 | 30 days supply | Qty: 30 | Fill #6

## 2015-04-18 MED FILL — VALSARTAN 40 MG TABLET: 40 | 30 days supply | Qty: 30 | Fill #1

## 2015-05-21 MED FILL — VALSARTAN 40 MG TABLET: 40 | 30 days supply | Qty: 30 | Fill #2

## 2015-06-29 MED FILL — VALSARTAN 40 MG TABLET: 40 | 30 days supply | Qty: 30 | Fill #3

## 2015-06-29 MED FILL — PHENTERMINE 37.5 MG TABLET: 37.5 | 30 days supply | Qty: 30 | Fill #0

## 2015-08-03 MED FILL — VALSARTAN 40 MG TABLET: 40 | 30 days supply | Qty: 30 | Fill #4

## 2015-09-20 MED FILL — VALSARTAN 40 MG TABLET: 40 | 30 days supply | Qty: 30 | Fill #5

## 2015-11-13 MED FILL — VALSARTAN 40 MG TABLET: 40 | 30 days supply | Qty: 30 | Fill #0

## 2015-11-13 MED FILL — HYDROCHLOROTHIAZIDE 12.5 MG: 12.5 | 30 days supply | Qty: 30 | Fill #0

## 2015-11-13 MED FILL — FLUTICASONE PROP 50 MCG SPR: 50 | 30 days supply | Qty: 16 | Fill #0

## 2015-11-13 MED FILL — MOMETASONE FUROATE 0.1% SOL: 0.1 | 30 days supply | Qty: 30 | Fill #0

## 2015-12-18 MED FILL — HYDROCHLOROTHIAZIDE 12.5 MG: 12.5 | 30 days supply | Qty: 30 | Fill #1

## 2015-12-19 MED FILL — VALSARTAN 40 MG TABLET: 40 | 30 days supply | Qty: 30 | Fill #1

## 2016-01-24 MED FILL — HYDROCHLOROTHIAZIDE 12.5 MG: 12.5 | 30 days supply | Qty: 30 | Fill #2

## 2016-01-25 MED FILL — PHENTERMINE 30 MG CAPSULE: 30 | 30 days supply | Qty: 30 | Fill #0

## 2016-01-25 MED FILL — VALSARTAN 40 MG TABLET: 40 | 30 days supply | Qty: 30 | Fill #2

## 2016-02-29 MED FILL — HYDROCHLOROTHIAZIDE 12.5 MG: 12.5 | 30 days supply | Qty: 30 | Fill #3

## 2016-02-29 MED FILL — VALSARTAN 40 MG TABLET: 40 | 30 days supply | Qty: 30 | Fill #3

## 2016-03-31 MED FILL — VALSARTAN 40 MG TABLET: 40 | 30 days supply | Qty: 30 | Fill #4

## 2016-03-31 MED FILL — HYDROCHLOROTHIAZIDE 12.5 MG: 12.5 | 30 days supply | Qty: 30 | Fill #4

## 2016-04-30 MED FILL — PHENTERMINE HCL 30 MG CAP: 30 | 30 days supply | Qty: 30 | Fill #1

## 2016-04-30 MED FILL — HYDROCHLOROTHIAZIDE 12.5 MG: 12.5 | 30 days supply | Qty: 30 | Fill #5

## 2016-04-30 MED FILL — VALSARTAN 40 MG TABLET: 40 | 30 days supply | Qty: 30 | Fill #5

## 2016-05-27 MED FILL — HYDROCHLOROTHIAZIDE 12.5 MG: 12.5 | 30 days supply | Qty: 30 | Fill #0

## 2016-05-27 MED FILL — VALSARTAN 40 MG TABLET: 40 | 30 days supply | Qty: 30 | Fill #0

## 2016-05-27 MED FILL — SCOPOLAMINE 1 MG/3 DAY PATC: 1 | 9 days supply | Qty: 3 | Fill #0

## 2016-05-30 MED FILL — PHENTERMINE 30 MG CAPSULE: 30 | 30 days supply | Qty: 30 | Fill #2

## 2016-07-11 MED FILL — HYDROCHLOROTHIAZIDE 12.5 MG: 12.5 | 30 days supply | Qty: 30 | Fill #1

## 2016-07-11 MED FILL — VALSARTAN 40 MG TABLET: 40 | 30 days supply | Qty: 30 | Fill #1

## 2016-08-06 MED FILL — LEVOCETIRIZINE 5 MG TABLET: 5 | 30 days supply | Qty: 30 | Fill #0

## 2016-08-06 MED FILL — VALSARTAN 40 MG TABLET: 40 | 30 days supply | Qty: 30 | Fill #2

## 2016-08-06 MED FILL — HYDROCHLOROTHIAZIDE 12.5 MG: 12.5 | 30 days supply | Qty: 30 | Fill #2

## 2016-08-07 ENCOUNTER — Other Ambulatory Visit: Payer: Self-pay | Admitting: Internal Medicine

## 2016-08-07 DIAGNOSIS — Z1231 Encounter for screening mammogram for malignant neoplasm of breast: Secondary | ICD-10-CM

## 2016-08-25 ENCOUNTER — Ambulatory Visit
Admission: RE | Admit: 2016-08-25 | Discharge: 2016-08-25 | Disposition: A | Discharge status: Home / Self Care | Payer: Self-pay | Source: Ambulatory Visit | Attending: Internal Medicine | Admitting: Internal Medicine

## 2016-08-25 DIAGNOSIS — Z1231 Encounter for screening mammogram for malignant neoplasm of breast: Secondary | ICD-10-CM

## 2016-08-26 ENCOUNTER — Ambulatory Visit: Payer: Self-pay

## 2016-09-19 MED FILL — LEVOCETIRIZINE 5 MG TABLET: 5 | 30 days supply | Qty: 30 | Fill #1

## 2016-09-19 MED FILL — HYDROCHLOROTHIAZIDE 12.5 MG: 12.5 | 30 days supply | Qty: 30 | Fill #3 | Status: TO

## 2016-09-19 MED FILL — VALSARTAN 40 MG TABLET: 40 | 30 days supply | Qty: 30 | Fill #3

## 2016-09-24 MED FILL — BENZONATATE 200 MG CAPSULE: 200 | 10 days supply | Qty: 30 | Fill #0

## 2016-10-08 ENCOUNTER — Other Ambulatory Visit: Payer: Self-pay | Admitting: Internal Medicine

## 2016-10-08 ENCOUNTER — Ambulatory Visit
Admission: RE | Admit: 2016-10-08 | Discharge: 2016-10-08 | Disposition: A | Discharge status: Home / Self Care | Payer: 59 | Source: Ambulatory Visit | Attending: Internal Medicine | Admitting: Internal Medicine

## 2016-10-08 DIAGNOSIS — R059 Cough, unspecified: Secondary | ICD-10-CM

## 2016-10-08 DIAGNOSIS — R9389 Abnormal findings on diagnostic imaging of other specified body structures: Secondary | ICD-10-CM

## 2016-10-08 DIAGNOSIS — R05 Cough: Secondary | ICD-10-CM

## 2016-10-08 MED FILL — LOSARTAN POTASSIUM 25 MG TA: 25 | 31 days supply | Qty: 31 | Fill #0

## 2016-10-10 ENCOUNTER — Ambulatory Visit
Admission: RE | Admit: 2016-10-10 | Discharge: 2016-10-10 | Disposition: A | Discharge status: Home / Self Care | Payer: 59 | Source: Ambulatory Visit | Attending: Internal Medicine | Admitting: Internal Medicine

## 2016-10-10 ENCOUNTER — Telehealth: Payer: Self-pay | Admitting: Internal Medicine

## 2016-10-10 ENCOUNTER — Other Ambulatory Visit: Payer: 59

## 2016-10-10 ENCOUNTER — Other Ambulatory Visit: Payer: Self-pay | Admitting: Internal Medicine

## 2016-10-10 DIAGNOSIS — R911 Solitary pulmonary nodule: Secondary | ICD-10-CM

## 2016-10-10 DIAGNOSIS — R9389 Abnormal findings on diagnostic imaging of other specified body structures: Secondary | ICD-10-CM

## 2016-10-10 MED ORDER — IOPAMIDOL (ISOVUE-300) INJECTION 61%
75.0000 mL | Freq: Once | INTRAVENOUS | Status: AC | PRN
  Administered 2016-10-10: 75 mL via INTRAVENOUS

## 2016-10-10 NOTE — Telephone Encounter (Signed)
Dr Lysle Rubens called re chronic cough with miliary pattern on cxr   rec sputum for afb if productive, Quant TB, crypto and histo serologies and hiv Stay home until we have afb back and then FOB vs CT bx of largest nodule in LLL sup segment   Triage:  Be sure above is done and come to office with mask on this week ok to add to my schedule for Wed 130  9/19 if no one else can see her sooner

## 2016-10-13 ENCOUNTER — Telehealth: Payer: Self-pay | Admitting: Pulmonary Disease

## 2016-10-13 NOTE — Telephone Encounter (Signed)
I called back Karly at Dr. Lorenda Hatchet office but no answer. I left a detailed message stating I would call her back after I got the ok from [facility]. Please advise if we can move her up sooner. Thanks.

## 2016-10-13 NOTE — Telephone Encounter (Signed)
Spoke with Freada Bergeron at Dr [facility] office and give pt appt time 10/15/16 1:30, check in at 1:10 and bring all meds with her to appt.  She verbalized understanding and will contact pt with appt details.  Nothing further needed.

## 2016-10-13 NOTE — Telephone Encounter (Signed)
Spoke with Karly at Dr Husein's office and give pt appt time 10/15/16 1:30, check in at 1:10 and bring all meds with her to appt.  She verbalized understanding and will contact pt with appt details.  Nothing further needed.  

## 2016-10-14 ENCOUNTER — Telehealth: Payer: Self-pay | Admitting: Pulmonary Disease

## 2016-10-14 MED FILL — HYDROCHLOROTHIAZIDE 12.5 MG: 12.5 | 30 days supply | Qty: 30 | Fill #0

## 2016-10-14 MED FILL — BENZONATATE 200 MG CAPSULE: 200 | 10 days supply | Qty: 30 | Fill #0

## 2016-10-14 NOTE — Telephone Encounter (Signed)
Bring her in at 1pm but make sure she wears a mask and we put her right back in a room to complete her paperwork as she has a relatively high risk of TB

## 2016-10-14 NOTE — Telephone Encounter (Signed)
MW pt was never added to your schedule for her appt with you on 9/19 at 1:30.  Is it ok to double book you at 1:30>  Thanks

## 2016-10-14 NOTE — Telephone Encounter (Signed)
Pt has been scheduled with MW tomorrow at 130. Advised to get here early. She verbalized understanding. Nothing else needed at time of call.

## 2016-10-15 ENCOUNTER — Ambulatory Visit (INDEPENDENT_AMBULATORY_CARE_PROVIDER_SITE_OTHER): Payer: 59 | Admitting: Internal Medicine

## 2016-10-15 ENCOUNTER — Encounter: Payer: Self-pay | Admitting: Internal Medicine

## 2016-10-15 VITALS — BP 132/84 | HR 74 | Temp 98.9°F | Ht 66.0 in | Wt 233.0 lb

## 2016-10-15 DIAGNOSIS — R918 Other nonspecific abnormal finding of lung field: Secondary | ICD-10-CM

## 2016-10-15 DIAGNOSIS — R05 Cough: Secondary | ICD-10-CM | POA: Diagnosis not present

## 2016-10-15 DIAGNOSIS — R058 Other specified cough: Secondary | ICD-10-CM

## 2016-10-15 MED ORDER — PANTOPRAZOLE SODIUM 40 MG PO TBEC: 40.0000 mg | DELAYED_RELEASE_TABLET | Freq: Every day | ORAL | 2 refills | Status: DC

## 2016-10-15 MED ORDER — TRAMADOL HCL 50 MG PO TABS: ORAL_TABLET | ORAL | 0 refills | Status: DC

## 2016-10-15 MED ORDER — FAMOTIDINE 20 MG PO TABS: ORAL_TABLET | ORAL | 2 refills | Status: DC

## 2016-10-15 MED FILL — traMADol HCL 50 MG TABS: 50 | 7 days supply | Qty: 40 | Fill #0

## 2016-10-15 MED FILL — PANTOPRAZOLE SOD DR 40 MG T: 40 | 30 days supply | Qty: 30 | Fill #0

## 2016-10-15 NOTE — Progress Notes (Signed)
Subjective:     Patient ID: Nancy Moore, female   DOB: 07-Dec-1965    MRN: [id number]  HPI  51 yobf never smoker grew up in [place]  worked clerical/education for [employer]  s direct pt contact and neg TB skin test around 2013 with tendency to cough /sinus problem mostly in fall x 2015 and bad cough while on lisinopril and gerd around 2016 with resolution on arb and gerd rx (which eventually was stopped)   then cough recurred end of July 2018 with w/u by Dr Lysle Rubens pos for mpn's so referred to pulmonary clinic 10/15/2016 by Dr   Lysle Rubens   10/15/2016 1st [facility] office visit/ Kiing Deakin   Chief Complaint  Patient presents with  . Pulmonary Consult    Referred by Dr. Deforest Hoyles for possible TB.  Pt c/o since mid July 2018- prod at times with clear sputum. She states cough can be triggered by chemical type smells and also by lying down flat. She has had to sleep sitting upright for the past month.    cough onset was acute late July 2018 persistent day and night since >  cough/ worse at hs / dry  Does seem worse with meals started back on pepcid one daily before eating    Does have h/o rectal bleeding dx a crohns onset age 51 and asympt 50-40 then more abd pain/ constipation x 2008  No fever, sweats, no wt loss no pleurtic cp  No sinus problem currently  Worse cough at hs and with deep breath but never produces anything   Not limited by breathing from desired activities     No obvious day to day or daytime variability or assoc excess/ purulent sputum or mucus plugs or hemoptysis or cp or chest tightness, subjective wheeze or overt sinus or hb symptoms. No unusual exp hx or h/o childhood pna/ asthma or knowledge of premature birth.    Also denies any obvious fluctuation of symptoms with weather or environmental changes or other aggravating or alleviating factors except as outlined above   Current Allergies, Complete Past Medical History, Past Surgical History, Family History, and Social History were  reviewed in [employer] record.  ROS  The following are not active complaints unless bolded sore throat, dysphagia, dental problems, itching, sneezing,  nasal congestion or disharge of excess mucus or purulent secretions, ear ache,   fever, chills, sweats, unintended wt loss or wt gain, classically pleuritic or exertional cp,  orthopnea pnd or leg swelling, presyncope, palpitations, abdominal pain, anorexia, nausea, vomiting, diarrhea  or change in bowel habits or bladder habits, change in stools or change in urine, dysuria, hematuria,  rash, arthralgias, visual complaints, headache, numbness, weakness or ataxia or problems with walking or coordination,  change in mood/affect or memory.        Current Meds  Medication Sig  . aspirin EC 81 MG tablet Take 81 mg by mouth daily.  . hydrochlorothiazide (HYDRODIURIL) 12.5 MG tablet Take 12.5 mg by mouth daily.  Marland Kitchen ipratropium (ATROVENT) 0.03 % nasal spray Place 2 sprays into both nostrils daily.  Marland Kitchen levocetirizine (XYZAL) 5 MG tablet Take 5 mg by mouth every evening.  Marland Kitchen losartan (COZAAR) 25 MG tablet Take 25 mg by mouth daily.  . [DISCONTINUED] benzonatate (TESSALON) 200 MG capsule Take 200 mg by mouth 3 (three) times daily as needed for cough.  . [DISCONTINUED] ranitidine (ZANTAC) 75 MG tablet Take 75 mg by mouth daily.  Review of Systems     Objective:   Physical Exam Pleasant amb obese bf nad   Wt Readings from Last 3 Encounters:  10/15/16 233 lb (105.7 kg)  11/27/12 215 lb 1.6 oz (97.6 kg)    Vital signs reviewed - - Note on arrival 02 sats  96% on RA      HEENT: nl dentition, turbinates bilaterally, and oropharynx. Nl external ear canals without cough reflex   NECK :  without JVD/Nodes/TM/ nl carotid upstrokes bilaterally   LUNGS: no acc muscle use,  Nl contour chest which is clear to A and P bilaterally with cough on insp  maneuvers   CV:  RRR  no s3 or murmur or increase in P2, and no edema    ABD:  soft and nontender with nl inspiratory excursion in the supine position. No bruits or organomegaly appreciated, bowel sounds nl  MS:  Nl gait/ ext warm without deformities, calf tenderness, cyanosis or clubbing No obvious joint restrictions   SKIN: warm and dry without lesions    NEURO:  alert, approp, nl sensorium with  no motor or cerebellar deficits apparent     I personally reviewed images and agree with radiology impression as follows:   Chest CT 10/10/16  Numerous bilateral subcentimeter pulmonary nodules with focal coalescence of more prominent nodules over the superior segment of the left lower lobe with the 2 largest measuring 1.7 x 3 cm and 1.9 x 2.4 cm. No effusion. 1 cm right peritracheal lymph node likely reactive. Differential diagnosis would include infection to include granulomatous infection such as tuberculosis particularly giving the prominent superior segment left lower lobe involvement. Would also consider bacterial or fungal infection as well as inflammatory versus metastatic disease           Assessment:

## 2016-10-15 NOTE — Patient Instructions (Addendum)
Pantoprazole (protonix) 40 mg   Take  30-60 min before first meal of the day and Pepcid (famotidine)  20 mg one @  bedtime until return to office - this is the [place] way to tell whether stomach acid is contributing to your problem.   GERD (REFLUX)  is an extremely common cause of respiratory symptoms just like yours , many times with no obvious heartburn at all.    It can be treated with medication, but also with lifestyle changes including elevation of the head of your bed (ideally with 6 inch  bed blocks),  Smoking cessation, avoidance of late meals, excessive alcohol, and avoid fatty foods, chocolate, peppermint, colas, red wine, and acidic juices such as orange juice.  NO MINT OR MENTHOL PRODUCTS SO NO COUGH DROPS   USE SUGARLESS CANDY INSTEAD (Jolley ranchers or Stover's or Life Savers) or even ice chips will also do - the key is to swallow to prevent all throat clearing. NO OIL BASED VITAMINS - use powdered substitutes.  Take delsym two tsp every 12 hours and supplement if needed with  tramadol 50 mg up to 2 every 4 hours to suppress the urge to cough. Swallowing water or using ice chips/non mint and menthol containing candies (such as lifesavers or sugarless jolly ranchers) are also effective.  You should rest your voice and avoid activities that you know make you cough.  Once you have eliminated the cough for 3 straight days try reducing the tramadol first,  then the delsym as tolerated.    Come to outpatient registration at [facility] (behind the ER) at noon  Tuesday 10/21/16  with nothing to eat or drink after midnight Monday .for Bronchoscopy   .

## 2016-10-15 NOTE — Assessment & Plan Note (Addendum)
Onset of symptoms acute in July 2018  HIV   10/10/16 >  neg  10/10/16   esr = 22  Hist 10/10/16> neg  Crypto 10/10/16 > neg   Agree with ddx by radiology - this pt has h/o inflammatory bowel dz but never apparently on immunsuppressives but lived in [place] with nl ct abd/ chest porion 03/2013 which was well p last exposure so unlikely this is histo but remains in ddx and needs tissue dx if serologies not pos (pending)  Discussed in detail all the  indications, usual  risks and alternatives  relative to the benefits with patient who agrees to proceed with bronchoscopy with biopsy 10/21/16 which should give [place] time to get these back first.  Total time devoted to counseling  > 50 % of initial 60 min office visit:  review case with pt/ discussion of options/alternatives/ personally creating written customized instructions  in presence of pt  then going over those specific  Instructions directly with the pt including how to use all of the meds but in particular covering each new medication in detail and the difference between the maintenance= "automatic" meds and the prns using an action plan format for the latter (If this problem/symptom => do that organization reading Left to right).  Please see AVS from this visit for a full list of these instructions which I personally wrote for this pt and  are unique to this visit.

## 2016-10-16 ENCOUNTER — Encounter: Payer: Self-pay | Admitting: Internal Medicine

## 2016-10-16 DIAGNOSIS — R058 Other specified cough: Secondary | ICD-10-CM | POA: Insufficient documentation

## 2016-10-16 DIAGNOSIS — R05 Cough: Secondary | ICD-10-CM | POA: Insufficient documentation

## 2016-10-16 NOTE — Assessment & Plan Note (Addendum)
H/o acei cough  Resolved on ARB - GERD rx 10/15/2016 >>>  Upper airway cough syndrome (previously labeled PNDS) , is  so named because it's frequently impossible to sort out how much is  CR/sinusitis with freq throat clearing (which can be related to primary GERD)   vs  causing  secondary (" extra esophageal")  GERD from wide swings in gastric pressure that occur with throat clearing, often  promoting self use of mint and menthol lozenges that reduce the lower esophageal sphincter tone and exacerbate the problem further in a cyclical fashion.   These are the same pts (now being labeled as having "irritable larynx syndrome" by some cough centers) who not infrequently have a history of having failed to tolerate ace inhibitors,  dry powder inhalers or biphosphonates or report having atypical/extraesophageal reflux symptoms that don't respond to standard doses of PPI  and are easily confused as having aecopd or asthma flares by even experienced allergists/ pulmonologists (myself included).    Of the three most common causes of  Sub-acute or recurrent or chronic cough, only one (GERD)  can actually contribute to/ trigger  the other two (asthma and post nasal drip syndrome)  and perpetuate the cylce of cough.  While not intuitively obvious, many patients with chronic low grade reflux do not cough until there is a primary insult that disturbs the protective epithelial barrier and exposes sensitive nerve endings.   This is typically viral but can be direct physical injury such as with an endotracheal tube.   The point is that once this occurs, it is difficult to eliminate the cycle  using anything but a maximally effective acid suppression regimen at least in the short run, accompanied by an appropriate diet to address non acid GERD and control / eliminate the cough itself for at least 3 days.    rec max rx for gerd, short term elimination of cough with tramadol > f/u p sort out cause of mpns which may or may  not explain some component of her cough

## 2016-10-21 ENCOUNTER — Encounter (HOSPITAL_COMMUNITY): Payer: Self-pay | Admitting: Respiratory Therapy

## 2016-10-21 ENCOUNTER — Ambulatory Visit (HOSPITAL_COMMUNITY): Payer: 59

## 2016-10-21 ENCOUNTER — Encounter (HOSPITAL_COMMUNITY)
Admission: RE | Disposition: A | Discharge status: Home / Self Care | Payer: Self-pay | Source: Ambulatory Visit | Attending: Internal Medicine

## 2016-10-21 ENCOUNTER — Ambulatory Visit (HOSPITAL_COMMUNITY)
Admission: RE | Admit: 2016-10-21 | Discharge: 2016-10-21 | Disposition: A | Discharge status: Home / Self Care | Payer: 59 | Source: Ambulatory Visit | Attending: Internal Medicine | Admitting: Internal Medicine

## 2016-10-21 DIAGNOSIS — R918 Other nonspecific abnormal finding of lung field: Secondary | ICD-10-CM | POA: Diagnosis not present

## 2016-10-21 DIAGNOSIS — Z7982 Long term (current) use of aspirin: Secondary | ICD-10-CM | POA: Insufficient documentation

## 2016-10-21 DIAGNOSIS — K219 Gastro-esophageal reflux disease without esophagitis: Secondary | ICD-10-CM | POA: Diagnosis not present

## 2016-10-21 DIAGNOSIS — K5090 Crohn's disease, unspecified, without complications: Secondary | ICD-10-CM | POA: Diagnosis not present

## 2016-10-21 DIAGNOSIS — Z79899 Other long term (current) drug therapy: Secondary | ICD-10-CM | POA: Diagnosis not present

## 2016-10-21 DIAGNOSIS — Z9889 Other specified postprocedural states: Secondary | ICD-10-CM

## 2016-10-21 DIAGNOSIS — C3432 Malignant neoplasm of lower lobe, left bronchus or lung: Secondary | ICD-10-CM | POA: Insufficient documentation

## 2016-10-21 HISTORY — PX: VIDEO BRONCHOSCOPY: SHX5072

## 2016-10-21 SURGERY — BRONCHOSCOPY, WITH FLUOROSCOPY
Anesthesia: Moderate Sedation | Laterality: Bilateral

## 2016-10-21 MED ORDER — MEPERIDINE HCL 100 MG/ML IJ SOLN
INTRAMUSCULAR | Status: AC
  Filled 2016-10-21: qty 2

## 2016-10-21 MED ORDER — LIDOCAINE HCL 2 % EX GEL: 1.0000 "application " | Freq: Once | CUTANEOUS | Status: DC

## 2016-10-21 MED ORDER — SODIUM CHLORIDE 0.9 % IV SOLN
INTRAVENOUS | Status: DC
  Administered 2016-10-21: 13:00:00 via INTRAVENOUS

## 2016-10-21 MED ORDER — MEPERIDINE HCL 100 MG/ML IJ SOLN: 100.0000 mg | Freq: Once | INTRAMUSCULAR | Status: DC

## 2016-10-21 MED ORDER — PHENYLEPHRINE HCL 0.25 % NA SOLN: 1.0000 | Freq: Four times a day (QID) | NASAL | Status: DC | PRN

## 2016-10-21 MED ORDER — LIDOCAINE HCL 2 % EX GEL
CUTANEOUS | Status: DC | PRN
  Administered 2016-10-21: 1

## 2016-10-21 MED ORDER — LIDOCAINE HCL 1 % IJ SOLN
INTRAMUSCULAR | Status: DC | PRN
  Administered 2016-10-21: 6 mL via RESPIRATORY_TRACT

## 2016-10-21 MED ORDER — MIDAZOLAM HCL 10 MG/2ML IJ SOLN
INTRAMUSCULAR | Status: DC | PRN
  Administered 2016-10-21: 5 mg via INTRAVENOUS
  Administered 2016-10-21 (×2): 2.5 mg via INTRAVENOUS

## 2016-10-21 MED ORDER — PHENYLEPHRINE HCL 0.25 % NA SOLN
NASAL | Status: DC | PRN
  Administered 2016-10-21: 2 via NASAL

## 2016-10-21 MED ORDER — MIDAZOLAM HCL 5 MG/ML IJ SOLN
INTRAMUSCULAR | Status: AC
  Filled 2016-10-21: qty 2

## 2016-10-21 MED ORDER — MEPERIDINE HCL 25 MG/ML IJ SOLN
INTRAMUSCULAR | Status: DC | PRN
Start: 2016-10-21 — End: 2016-10-23
  Administered 2016-10-21 (×2): 50 mg via INTRAVENOUS

## 2016-10-21 MED ORDER — MIDAZOLAM HCL 5 MG/ML IJ SOLN: 1.0000 mg | Freq: Once | INTRAMUSCULAR | Status: DC

## 2016-10-21 NOTE — Discharge Instructions (Signed)
Flexible Bronchoscopy, Care After These instructions give you information on caring for yourself after your procedure. Your doctor may also give you more specific instructions. Call your doctor if you have any problems or questions after your procedure. Follow these instructions at home:  Do not eat or drink anything for 2 hours after your procedure. If you try to eat or drink before the medicine wears off, food or drink could go into your lungs. You could also burn yourself.  After 2 hours have passed and when you can cough and gag normally, you may eat soft food and drink liquids slowly.  The day after the test, you may eat your normal diet.  You may do your normal activities.  Keep all doctor visits. Get help right away if:  You get more and more short of breath.  You get light-headed.  You feel like you are going to pass out (faint).  You have chest pain.  You have new problems that worry you.  You cough up more than a little blood.  You cough up more blood than before. This information is not intended to replace advice given to you by your health care provider. Make sure you discuss any questions you have with your health care provider. Document Released: 11/10/2008 Document Revised: 06/21/2015 Document Reviewed: 09/17/2012 Elsevier Interactive Patient Education  2017 [place].  Nothing to eat or drink until   3:15    pm today 10/21/2016

## 2016-10-21 NOTE — Op Note (Signed)
Bronchoscopy Procedure Note  Date of Operation: 10/21/2016   Pre-op Diagnosis: Miliary nodules/ ? Etiology   Post-op Diagnosis: same  Surgeon: Christinia Gully  Anesthesia: Monitored Local Anesthesia with Sedation Time Started: 1310 total of versed 10 mg IV / Demerol 100 mg IV for severe agitation, cough just to get thru the cords despite multiple lidocaine rx Time Stopped:  1330  Operation: Video Flexible fiberoptic bronchoscopy, diagnostic   Findings: nl airways   Specimen: BAL LLL/ Tbbx x 1 LLL   Estimated Blood Loss: 25 cc  Complications: desaturations after initial bx / responded to jaw lift   Indications and History: See updated H and P same date. The risks, benefits, complications, treatment options and expected outcomes were discussed with the patient.  The possibilities of reaction to medication, pulmonary aspiration, perforation of a viscus, bleeding, failure to diagnose a condition and creating a complication requiring transfusion or operation were discussed with the patient who freely signed the consent.    Description of Procedure: The patient was re-examined in the bronchoscopy suite and the site of surgery properly noted/marked.  The patient was identified  and the procedure verified as Flexible Fiberoptic Bronchoscopy.  A Time Out was held and the above information confirmed.   After the induction of topical nasopharyngeal anesthesia, the patient was positioned  and the bronchoscope was passed through the R naris. The vocal cords were visualized and  1% buffered lidocaine 5 ml was topically placed onto the cords. The cords were nl, extremely sensitive though. The scope was then passed into the trachea.  1% buffered lidocaine given topically. Airways inspected bilaterally to the subsegmental level with the following findings:  Airways nl to subsegmental level   Interventions:  BAL  LLL  Tbbx x one good sample LLL post basal segment     The Patient was taken to the  Endoscopy Recovery area in satisfactory condition.  Attestation: I performed the procedure.  Christinia Gully, MD Pulmonary and [place] [phone] After 5:30 PM or weekends, call [phone]

## 2016-10-21 NOTE — Progress Notes (Signed)
Video Bronchoscopy done  Intervention Bronchial washing done Intervention Bronchial Biopsy done

## 2016-10-21 NOTE — H&P (Signed)
Patient ID: Nancy Moore, female   DOB: 1965/12/22    MRN: [id number]  HPI  52 yobf never smoker grew up in [place]  worked clerical/education for [employer]  s direct pt contact and neg TB skin test around 2013 with tendency to cough /sinus problem mostly in fall x 2015 and bad cough while on lisinopril and gerd around 2016 with resolution on arb and gerd rx (which eventually was stopped)   then cough recurred end of July 2018 with w/u by Dr Lysle Rubens pos for mpn's so referred to pulmonary clinic 10/15/2016 by Dr   Lysle Rubens   10/15/2016 1st [facility] office visit/ [facility]       Chief Complaint  Patient presents with  . Pulmonary Consult    Referred by Dr. Deforest Hoyles for possible TB.  Pt c/o since mid July 2018- prod at times with clear sputum. She states cough can be triggered by chemical type smells and also by lying down flat. She has had to sleep sitting upright for the past month.    cough onset was acute late July 2018 persistent day and night since >  cough/ worse at hs / dry  Does seem worse with meals started back on pepcid one daily before eating    Does have h/o rectal bleeding dx a crohns onset age 80 and asympt 9-40 then more abd pain/ constipation x 2008  No fever, sweats, no wt loss no pleurtic cp  No sinus problem currently  Worse cough at hs and with deep breath but never produces anything   Not limited by breathing from desired activities     No obvious day to day or daytime variability or assoc excess/ purulent sputum or mucus plugs or hemoptysis or cp or chest tightness, subjective wheeze or overt sinus or hb symptoms. No unusual exp hx or h/o childhood pna/ asthma or knowledge of premature birth.    Also denies any obvious fluctuation of symptoms with weather or environmental changes or other aggravating or alleviating factors except as outlined above   Current Allergies, Complete Past Medical History, Past Surgical History, Family History, and Social History were  reviewed in [employer] record.  ROS  The following are not active complaints unless bolded sore throat, dysphagia, dental problems, itching, sneezing,  nasal congestion or disharge of excess mucus or purulent secretions, ear ache,   fever, chills, sweats, unintended wt loss or wt gain, classically pleuritic or exertional cp,  orthopnea pnd or leg swelling, presyncope, palpitations, abdominal pain, anorexia, nausea, vomiting, diarrhea  or change in bowel habits or bladder habits, change in stools or change in urine, dysuria, hematuria,  rash, arthralgias, visual complaints, headache, numbness, weakness or ataxia or problems with walking or coordination,  change in mood/affect or memory.            Current Meds  Medication Sig  . aspirin EC 81 MG tablet Take 81 mg by mouth daily.  . hydrochlorothiazide (HYDRODIURIL) 12.5 MG tablet Take 12.5 mg by mouth daily.  Marland Kitchen ipratropium (ATROVENT) 0.03 % nasal spray Place 2 sprays into both nostrils daily.  Marland Kitchen levocetirizine (XYZAL) 5 MG tablet Take 5 mg by mouth every evening.  Marland Kitchen losartan (COZAAR) 25 MG tablet Take 25 mg by mouth daily.  . [DISCONTINUED] benzonatate (TESSALON) 200 MG capsule Take 200 mg by mouth 3 (three) times daily as needed for cough.  . [DISCONTINUED] ranitidine (ZANTAC) 75 MG tablet Take 75 mg by mouth daily.  Review of Systems     Objective:   Physical Exam Pleasant amb obese bf nad      Wt Readings from Last 3 Encounters:  10/15/16 233 lb (105.7 kg)  11/27/12 215 lb 1.6 oz (97.6 kg)    Vital signs reviewed - - Note on arrival 02 sats  96% on RA      HEENT: nl dentition, turbinates bilaterally, and oropharynx. Nl external ear canals without cough reflex   NECK :  without JVD/Nodes/TM/ nl carotid upstrokes bilaterally   LUNGS: no acc muscle use,  Nl contour chest which is clear to A and P bilaterally with cough on insp  maneuvers   CV:  RRR  no s3 or murmur or  increase in P2, and no edema   ABD:  soft and nontender with nl inspiratory excursion in the supine position. No bruits or organomegaly appreciated, bowel sounds nl  MS:  Nl gait/ ext warm without deformities, calf tenderness, cyanosis or clubbing No obvious joint restrictions   SKIN: warm and dry without lesions    NEURO:  alert, approp, nl sensorium with  no motor or cerebellar deficits apparent     I personally reviewed images and agree with radiology impression as follows:   Chest CT 10/10/16  Numerous bilateral subcentimeter pulmonary nodules with focal coalescence of more prominent nodules over the superior segment of the left lower lobe with the 2 largest measuring 1.7 x 3 cm and 1.9 x 2.4 cm. No effusion. 1 cm right peritracheal lymph node likely reactive. Differential diagnosis would include infection to include granulomatous infection such as tuberculosis particularly giving the prominent superior segment left lower lobe involvement. Would also consider bacterial or fungal infection as well as inflammatory versus metastatic disease           Assessment:              Assessment & Plan Note by Tanda Rockers, MD at 10/16/2016 6:48 AM   Author: Tanda Rockers, MD Author Type: Physician Filed: 10/16/2016 6:53 AM  Note Status: Bernell List: Cosign Not Required Encounter Date: 10/15/2016  Problem: Upper airway cough syndrome  Editor: Tanda Rockers, MD (Physician)  Prior Versions: 1. Tanda Rockers, MD (Physician) at 10/16/2016 6:50 AM - Written    H/o acei cough  Resolved on ARB - GERD rx 10/15/2016 >>>  Upper airway cough syndrome (previously labeled PNDS) , is  so named because it's frequently impossible to sort out how much is  CR/sinusitis with freq throat clearing (which can be related to primary GERD)   vs  causing  secondary (" extra esophageal")  GERD from wide swings in gastric pressure that occur with throat clearing, often  promoting  self use of mint and menthol lozenges that reduce the lower esophageal sphincter tone and exacerbate the problem further in a cyclical fashion.   These are the same pts (now being labeled as having "irritable larynx syndrome" by some cough centers) who not infrequently have a history of having failed to tolerate ace inhibitors,  dry powder inhalers or biphosphonates or report having atypical/extraesophageal reflux symptoms that don't respond to standard doses of PPI  and are easily confused as having aecopd or asthma flares by even experienced allergists/ pulmonologists (myself included).    Of the three most common causes of  Sub-acute or recurrent or chronic cough, only one (GERD)  can actually contribute to/ trigger  the other two (asthma and post nasal drip syndrome)  and perpetuate  the cylce of cough.  While not intuitively obvious, many patients with chronic low grade reflux do not cough until there is a primary insult that disturbs the protective epithelial barrier and exposes sensitive nerve endings.   This is typically viral but can be direct physical injury such as with an endotracheal tube.   The point is that once this occurs, it is difficult to eliminate the cycle  using anything but a maximally effective acid suppression regimen at least in the short run, accompanied by an appropriate diet to address non acid GERD and control / eliminate the cough itself for at least 3 days.    rec max rx for gerd, short term elimination of cough with tramadol > f/u p sort out cause of mpns which may or may not explain some component of her cough     Assessment & Plan Note by Tanda Rockers, MD at 10/15/2016 2:13 PM   Author: Tanda Rockers, MD Author Type: Physician Filed: 10/16/2016 6:46 AM  Note Status: Bernell List: Cosign Not Required Encounter Date: 10/15/2016  Problem: Multiple pulmonary nodules  Editor: Tanda Rockers, MD (Physician)  Prior Versions: 1. Tanda Rockers, MD (Physician)  at 10/15/2016 2:16 PM - Edited   2. Tanda Rockers, MD (Physician) at 10/15/2016 2:14 PM - Edited   3. Tanda Rockers, MD (Physician) at 10/15/2016 2:13 PM - Written    Onset of symptoms acute in July 2018  HIV   10/10/16 >  neg  10/10/16   esr = 22  Hist 10/10/16> neg  Crypto 10/10/16 > neg   Agree with ddx by radiology - this pt has h/o inflammatory bowel dz but never apparently on immunsuppressives but lived in [place] with nl ct abd/ chest porion 03/2013 which was well p last exposure so unlikely this is histo but remains in ddx and needs tissue dx if serologies not pos (pending)  Discussed in detail all the  indications, usual  risks and alternatives  relative to the benefits with patient who agrees to proceed with bronchoscopy with biopsy 10/21/16 which should give [place] time to get these back first.  Total time devoted to counseling  > 50 % of initial 60 min office visit:  review case with pt/ discussion of options/alternatives/ personally creating written customized instructions  in presence of pt  then going over those specific  Instructions directly with the pt including how to use all of the meds but in particular covering each new medication in detail and the difference between the maintenance= "automatic" meds and the prns using an action plan format for the latter (If this problem/symptom => do that organization reading Left to right).  Please see AVS from this visit for a full list of these instructions which I personally wrote for this pt and  are unique to this visit.     Patient Instructions by Tanda Rockers, MD at 10/15/2016 1:30 PM   Author: Tanda Rockers, MD Author Type: Physician Filed: 10/15/2016 1:58 PM  Note Status: Addendum Mickle Mallory: Cosign Not Required Encounter Date: 10/15/2016  Editor: Tanda Rockers, MD (Physician)  Prior Versions: 1. Tanda Rockers, MD (Physician) at 10/15/2016 1:54 PM - Addendum   2. Tanda Rockers, MD (Physician) at 10/15/2016 1:52 PM -  Signed    Pantoprazole (protonix) 40 mg   Take  30-60 min before first meal of the day and Pepcid (famotidine)  20 mg one @  bedtime until return to  office - this is the [place] way to tell whether stomach acid is contributing to your problem.   GERD (REFLUX)  is an extremely common cause of respiratory symptoms just like yours , many times with no obvious heartburn at all.    It can be treated with medication, but also with lifestyle changes including elevation of the head of your bed (ideally with 6 inch  bed blocks),  Smoking cessation, avoidance of late meals, excessive alcohol, and avoid fatty foods, chocolate, peppermint, colas, red wine, and acidic juices such as orange juice.  NO MINT OR MENTHOL PRODUCTS SO NO COUGH DROPS   USE SUGARLESS CANDY INSTEAD (Jolley ranchers or Stover's or Life Savers) or even ice chips will also do - the key is to swallow to prevent all throat clearing. NO OIL BASED VITAMINS - use powdered substitutes.  Take delsym two tsp every 12 hours and supplement if needed with  tramadol 50 mg up to 2 every 4 hours to suppress the urge to cough. Swallowing water or using ice chips/non mint and menthol containing candies (such as lifesavers or sugarless jolly ranchers) are also effective.  You should rest your voice and avoid activities that you know make you cough.  Once you have eliminated the cough for 3 straight days try reducing the tramadol first,  then the delsym as tolerated.    Come to outpatient registration at [facility] (behind the ER) at noon  Tuesday 10/21/16  with nothing to eat or drink after midnight Monday .for Bronchoscopy   .         10/21/2016 day of FOB/ no change hx or exam.    Christinia Gully, MD [facility] [place] [phone] After 5:30 PM or weekends, use Bee[facility] [phone]

## 2016-10-22 ENCOUNTER — Encounter (HOSPITAL_COMMUNITY): Payer: Self-pay | Admitting: Internal Medicine

## 2016-10-22 ENCOUNTER — Telehealth: Payer: Self-pay | Admitting: Internal Medicine

## 2016-10-22 DIAGNOSIS — R918 Other nonspecific abnormal finding of lung field: Secondary | ICD-10-CM

## 2016-10-22 LAB — ACID FAST SMEAR (AFB, MYCOBACTERIA): Acid Fast Smear: NEGATIVE

## 2016-10-22 NOTE — Telephone Encounter (Signed)
Needs pet and referral to oncology dx lung mass

## 2016-10-22 NOTE — Telephone Encounter (Signed)
Spoke with patient. She stated that MW spoke with her a few minutes ago. Wishes to proceed with the PET scan and referral to oncology. Will go ahead and place these orders. Nothing else needed at time of call.

## 2016-10-22 NOTE — Telephone Encounter (Signed)
Spoke with patient. She was requesting results from her bronchoscopy from yesterday. Advised patient that it sometimes takes a few days to process the results. She verbalized understanding.   However, she wanted to know about how to proceed with her job. As of right now, she is not out on disability or FMLA. It is just imminent leave. She wants to know if she needs to proceed with the paper for disability. She is concerned about her job.   MW, please advise. Thanks!

## 2016-10-23 ENCOUNTER — Telehealth: Payer: Self-pay | Admitting: Internal Medicine

## 2016-10-23 NOTE — Telephone Encounter (Signed)
Rec'd via fax disability paperwork from [place]. Fwd to [employer] via interoffice mail - 10/23/16 -pr

## 2016-10-27 ENCOUNTER — Telehealth: Payer: Self-pay | Admitting: *Deleted

## 2016-10-27 DIAGNOSIS — R918 Other nonspecific abnormal finding of lung field: Secondary | ICD-10-CM

## 2016-10-27 NOTE — Telephone Encounter (Signed)
Oncology Nurse Navigator Documentation  Oncology Nurse Navigator Flowsheets 10/27/2016  Navigator Location [facility]  Referral date to RadOnc/MedOnc 10/28/2016  Navigator Encounter Type Telephone/I received referral on Nancy Moore today. I called her and scheduled her to be seen tomorrow with Dr. Julien Nordmann. She verbalized understanding of appt time and place.   Telephone Outgoing Call  Abnormal Finding Date 10/10/2016  Confirmed Diagnosis Date 10/21/2016  Treatment Phase Pre-Tx/Tx Discussion  Barriers/Navigation Needs Coordination of Care  Interventions Coordination of Care  Coordination of Care Appts  Acuity Level 2  Acuity Level 2 Assistance expediting appointments  Time Spent with Patient 30

## 2016-10-28 ENCOUNTER — Ambulatory Visit: Payer: 59

## 2016-10-28 ENCOUNTER — Other Ambulatory Visit (HOSPITAL_BASED_OUTPATIENT_CLINIC_OR_DEPARTMENT_OTHER): Payer: 59

## 2016-10-28 ENCOUNTER — Encounter: Payer: Self-pay | Admitting: Internal Medicine

## 2016-10-28 ENCOUNTER — Telehealth: Payer: Self-pay | Admitting: Internal Medicine

## 2016-10-28 ENCOUNTER — Ambulatory Visit (HOSPITAL_BASED_OUTPATIENT_CLINIC_OR_DEPARTMENT_OTHER): Payer: 59 | Admitting: Internal Medicine

## 2016-10-28 DIAGNOSIS — R918 Other nonspecific abnormal finding of lung field: Secondary | ICD-10-CM

## 2016-10-28 DIAGNOSIS — C3432 Malignant neoplasm of lower lobe, left bronchus or lung: Secondary | ICD-10-CM

## 2016-10-28 DIAGNOSIS — Z7189 Other specified counseling: Secondary | ICD-10-CM | POA: Insufficient documentation

## 2016-10-28 DIAGNOSIS — C3492 Malignant neoplasm of unspecified part of left bronchus or lung: Secondary | ICD-10-CM

## 2016-10-28 DIAGNOSIS — C3490 Malignant neoplasm of unspecified part of unspecified bronchus or lung: Secondary | ICD-10-CM

## 2016-10-28 HISTORY — DX: Other specified counseling: Z71.89

## 2016-10-28 HISTORY — DX: Malignant neoplasm of unspecified part of left bronchus or lung: C34.92

## 2016-10-28 LAB — CBC WITH DIFFERENTIAL/PLATELET
BASO%: 0.7 % (ref 0.0–2.0)
Basophils Absolute: 0.1 10*3/uL (ref 0.0–0.1)
EOS%: 5.9 % (ref 0.0–7.0)
Eosinophils Absolute: 0.7 10*3/uL — ABNORMAL HIGH (ref 0.0–0.5)
HCT: 40.2 % (ref 34.8–46.6)
HGB: 12.9 g/dL (ref 11.6–15.9)
LYMPH%: 19.8 % (ref 14.0–49.7)
MCH: 28.9 pg (ref 25.1–34.0)
MCHC: 32.2 g/dL (ref 31.5–36.0)
MCV: 89.7 fL (ref 79.5–101.0)
MONO#: 0.9 10*3/uL (ref 0.1–0.9)
MONO%: 8.1 % (ref 0.0–14.0)
NEUT#: 7.5 10*3/uL — ABNORMAL HIGH (ref 1.5–6.5)
NEUT%: 65.5 % (ref 38.4–76.8)
Platelets: 280 10*3/uL (ref 145–400)
RBC: 4.48 10*6/uL (ref 3.70–5.45)
RDW: 14.4 % (ref 11.2–14.5)
WBC: 11.4 10*3/uL — ABNORMAL HIGH (ref 3.9–10.3)
lymph#: 2.3 10*3/uL (ref 0.9–3.3)

## 2016-10-28 LAB — COMPREHENSIVE METABOLIC PANEL
ALT: 11 U/L (ref 0–55)
AST: 18 U/L (ref 5–34)
Albumin: 3.3 g/dL — ABNORMAL LOW (ref 3.5–5.0)
Alkaline Phosphatase: 97 U/L (ref 40–150)
Anion Gap: 8 mEq/L (ref 3–11)
BUN: 5.2 mg/dL — ABNORMAL LOW (ref 7.0–26.0)
CO2: 28 mEq/L (ref 22–29)
Calcium: 9.5 mg/dL (ref 8.4–10.4)
Chloride: 105 mEq/L (ref 98–109)
Creatinine: 0.9 mg/dL (ref 0.6–1.1)
EGFR: 89 mL/min/{1.73_m2} — ABNORMAL LOW (ref >90)
Glucose: 97 mg/dl (ref 70–140)
Potassium: 3.9 mEq/L (ref 3.5–5.1)
Sodium: 141 mEq/L (ref 136–145)
Total Bilirubin: 0.43 mg/dL (ref 0.20–1.20)
Total Protein: 6.9 g/dL (ref 6.4–8.3)

## 2016-10-28 NOTE — Telephone Encounter (Signed)
Scheduled appt per 10/2 los - Gave patient AVS and calender per los.  

## 2016-10-28 NOTE — Progress Notes (Signed)
[name]   [phone]  CONSULT NOTE  REFERRING PHYSICIAN:Dr. Christinia Gully  REASON FOR CONSULTATION:  51 years old African-American female recently diagnosed with lung cancer.  HPI Nancy Moore is a 51 y.o. female is a never smoker with past medical history significant for hypertension, Crohn's disease and not currently on any active treatment. The patient also has a history of benign left breast biopsy several years ago. The patient mentions that she has been complaining of wheezing since July 2018. She was seen at one of the urgent care center and was treated for allergywith no improvement of her condition. She started having dry cough and tried over the counter cough medication with Delsym s well as prescription of Tussionex with no improvement. The patient was seen by her primary care physician and chest x-ray was performed on 10/08/2016 and it showed widespread opacity favoring miliary pattern. This was followed by CT scan of the chest with contrast on 10/10/2016 and it showed numerous bilateral subcentimeter pulmonary nodules.There is focal area of more prominent nodules over the superior segment of the left lower lobe with the largest 2 nodules measuring 1.7 x 3.0 cm and 1.9 x 2.4 cm. There was also minimal nodal prominence over the left neck base with the largest node measuring 1.1 cm by short axis. There was 1.0 cm right peritracheal lymph node as well as few other shotty mediastinal nodes present. There was no hilar adenopathy and no axillary lymphadenopathy. The patient was referred to Dr. Melvyn Novas and on 10/21/2016 she underwent bronchoscopy with bronchoalveolar lavage of the left lower lobe as well as transbronchial biopsy of left lower lobe. The final pathology [id number]) was consistent with poorly differentiated adenocarcinoma. There was insufficient material for molecular testing. Dr. Melvyn Novas kindly referred the patient to me today for  evaluation and recommendation regarding treatment of her condition. When seen today the patient continues to have shortness breath at baseline and increased with exertion and also when laying down. She has dry cough and chest pain secondary to the cough. She denied having any hemoptysis. She denied having any weight loss or night sweats. The patient denied having any headache or visual changes. She has no nausea, vomitingor diarrhea but has occasional constipation. Family history significant for mother with a stroke and father has dementia and hypertension. Both of them are still alive. Her family history significant for paternal grandmother as well as an 26 with pancreatic cancer. The patient is divorced and has no children. She was accompanied today by her sister Nancy Moore. 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 She has no history of smoking and drinks alcohol occasionally. She denied having any history of drug abuse.  HPI  Past Medical History:  Diagnosis Date  . Hypertension     Past Surgical History:  Procedure Laterality Date  . BREAST EXCISIONAL BIOPSY Left 20+ yrs ago   benign  . VIDEO BRONCHOSCOPY Bilateral 10/21/2016   Procedure: VIDEO BRONCHOSCOPY WITH FLUORO;  Surgeon: Tanda Rockers, MD;  Location: [facility];  Service: Cardiopulmonary;  Laterality: Bilateral;    Family History  Problem Relation Age of Onset  . Asthma Mother     Social History Social History  Substance Use Topics  . Smoking status: Never Smoker  . Smokeless tobacco: Never Used  . Alcohol use Yes     Comment: socially    Allergies  Allergen Reactions  . Percocet [Oxycodone-Acetaminophen] Other (See Comments)    "  makes me dizzy"  . Lisinopril Cough    Current Outpatient Prescriptions  Medication Sig Dispense Refill  . aspirin EC 81 MG tablet Take 81 mg by mouth daily.    Marland Kitchen Dextromethorphan-Guaifenesin (DELSYM COUGH/CHEST CONGEST DM) 5-100 MG/5ML LIQD Take 10 mLs by mouth  2 (two) times daily as needed (coughing).    . famotidine (PEPCID) 20 MG tablet One at bedtime (Patient taking differently: Take 20 mg by mouth at bedtime. One at bedtime) 30 tablet 2  . hydrochlorothiazide (HYDRODIURIL) 12.5 MG tablet Take 12.5 mg by mouth daily.    Marland Kitchen ipratropium (ATROVENT) 0.03 % nasal spray Place 2 sprays into both nostrils daily.    Marland Kitchen levocetirizine (XYZAL) 5 MG tablet Take 5 mg by mouth every evening.    Marland Kitchen losartan (COZAAR) 25 MG tablet Take 25 mg by mouth daily.    . pantoprazole (PROTONIX) 40 MG tablet Take 1 tablet (40 mg total) by mouth daily. Take 30-60 min before first meal of the day 30 tablet 2  . traMADol (ULTRAM) 50 MG tablet 1-2 every 4 hours as needed for cough or pain 40 tablet 0   No current facility-administered medications for this visit.     Review of Systems  Constitutional: positive for fatigue Eyes: negative Ears, nose, mouth, throat, and face: negative Respiratory: positive for cough, dyspnea on exertion and pleurisy/chest pain Cardiovascular: negative Gastrointestinal: negative Genitourinary:negative Integument/breast: negative Hematologic/lymphatic: negative Musculoskeletal:negative Neurological: negative Behavioral/Psych: negative Endocrine: negative Allergic/Immunologic: negative  Physical Exam  DJT:TSVXB, healthy, no distress, well nourished, well developed and anxious SKIN: skin color, texture, turgor are normal, no rashes or significant lesions HEAD: Normocephalic, No masses, lesions, tenderness or abnormalities EYES: normal, PERRLA, Conjunctiva are pink and non-injected EARS: External ears normal, Canals clear OROPHARYNX:no exudate, no erythema and lips, buccal mucosa, and tongue normal  NECK: supple, no adenopathy, no JVD LYMPH:  no palpable lymphadenopathy, no hepatosplenomegaly BREAST:not examined LUNGS: expiratory wheezes bilaterally, scattered rales bilaterally HEART: regular rate & rhythm, no murmurs and no  gallops ABDOMEN:abdomen soft, non-tender, normal bowel sounds and no masses or organomegaly BACK: Back symmetric, no curvature., No CVA tenderness EXTREMITIES:no joint deformities, effusion, or inflammation, no edema, no skin discoloration  NEURO: alert & oriented x 3 with fluent speech, no focal motor/sensory deficits  PERFORMANCE STATUS: ECOG 1  LABORATORY DATA: Lab Results  Component Value Date   WBC 11.4 (H) 10/28/2016   HGB 12.9 10/28/2016   HCT 40.2 10/28/2016   MCV 89.7 10/28/2016   PLT 280 10/28/2016      Chemistry      Component Value Date/Time   NA 141 10/28/2016 1406   K 3.9 10/28/2016 1406   CL 99 07/22/2010 2022   CO2 28 10/28/2016 1406   BUN 5.2 (L) 10/28/2016 1406   CREATININE 0.9 10/28/2016 1406      Component Value Date/Time   CALCIUM 9.5 10/28/2016 1406   ALKPHOS 97 10/28/2016 1406   AST 18 10/28/2016 1406   ALT 11 10/28/2016 1406   BILITOT 0.43 10/28/2016 1406       RADIOGRAPHIC STUDIES: Dg Chest 2 View  Result Date: 10/08/2016 CLINICAL DATA:  Prolonged cough. EXAM: CHEST  2 VIEW COMPARISON:  Report from 01/13/2002 chest x-ray FINDINGS: Widespread symmetric lung opacity, new per report in 2003. This has a miliary appearance with few nonspecific Kerley lines seen peripherally. No effusion or cardiomegaly. No visible adenopathy. In the absence of malignancy history would favor granulomatous inflammation/infection or pneumoconiosis in the appropriate setting. Miliary tuberculosis could  have this appearance. These results will be called to the ordering clinician or representative by the Radiologist Assistant, and communication documented in the PACS or zVision Dashboard. IMPRESSION: Widespread opacity, favor miliary pattern, with differential discussed above. Miliary tuberculosis should specifically be considered. Chest CT is recommended. Electronically Signed   By: Monte Fantasia M.D.   On: 10/08/2016 11:09   Ct Chest W Contrast  Addendum Date: 10/10/2016    ADDENDUM REPORT: 10/10/2016 10:43 ADDENDUM: These results were called by telephone at the time of interpretation on 10/10/2016 at 10 a.m. to Dr. [name] , who verbally acknowledged these results. Electronically Signed   By: Marin Olp M.D.   On: 10/10/2016 10:43   Addendum Date: 10/10/2016   ADDENDUM REPORT: 10/10/2016 09:47 ADDENDUM: Focal elliptical filling defect over the right internal jugular vein which may represent focal thrombus versus artifact. Recommend ultrasound for further evaluation. Electronically Signed   By: Marin Olp M.D.   On: 10/10/2016 09:47   Result Date: 10/10/2016 CLINICAL DATA:  Abnormal chest x-ray. Cough and shortness of breath 4 weeks. Finished course of prednisone. EXAM: CT CHEST WITH CONTRAST TECHNIQUE: Multidetector CT imaging of the chest was performed during intravenous contrast administration. CONTRAST:  59m ISOVUE-300 IOPAMIDOL (ISOVUE-300) INJECTION 61% COMPARISON:  Chest x-ray 10/08/2016.  Abdominal CT 03/31/2013 FINDINGS: Cardiovascular: Heart is normal size. There is a small pericardial effusion with Hounsfield unit measurements of 24. Mild prominence of the ascending thoracic aorta measuring 3.6 cm in AP diameter. No evidence of aortic dissection. Pulmonary arterial system is within normal. Mediastinum/Nodes: Minimal nodal prominence over the left neck base with the largest node measuring 1.1 cm by short axis. 1 cm right peritracheal lymph node. Few other shotty mediastinal lymph nodes are present. No hilar adenopathy. No axillary adenopathy. Remaining mediastinal structures are within normal. Lungs/Pleura: Lungs are adequately inflated and demonstrate numerous bilateral subcentimeter pulmonary nodules. There is focal area of more prominent coalescing nodules over the superior segment of the left lower lobe with the 2 largest measuring 1.7 x 3 cm and 1.9 x 2.4 cm. No evidence of effusion. Airways are normal. Upper Abdomen: Few subcentimeter periaortic lymph  nodes, otherwise unremarkable. Musculoskeletal: Within normal. IMPRESSION: Numerous bilateral subcentimeter pulmonary nodules with focal coalescence of more prominent nodules over the superior segment of the left lower lobe with the 2 largest measuring 1.7 x 3 cm and 1.9 x 2.4 cm. No effusion. 1 cm right peritracheal lymph node likely reactive. Differential diagnosis would include infection to include granulomatous infection such as tuberculosis particularly giving the prominent superior segment left lower lobe involvement. Would also consider bacterial or fungal infection as well as inflammatory versus metastatic disease. Recommend clinical correlation as consider follow-up CT 4 weeks. If findings unchanged at that time, would then recommend tissue sampling. Small minimally complicated pericardial effusion. Ectasia of the ascending thoracic aorta measuring 3.6 cm. Recommend annual imaging followup by CTA or MRA. This recommendation follows 2010 ACCF/AHA/AATS/ACR/ASA/SCA/SCAI/SIR/STS/SVM Guidelines for the Diagnosis and Management of Patients with Thoracic Aortic Disease. Circulation.2010; 121:: G920-F007 Electronically Signed: By: DMarin OlpM.D. On: 10/10/2016 09:08   [place]Venous Img Upper Uni Right  Result Date: 10/10/2016 CLINICAL DATA:  Pulmonary nodule question thrombus of the right internal jugular vein. EXAM: RIGHT UPPER EXTREMITY VENOUS DOPPLER ULTRASOUND TECHNIQUE: Gray-scale sonography with graded compression, as well as color Doppler and duplex ultrasound were performed to evaluate the upper extremity deep venous system from the level of the subclavian vein and including the jugular, axillary, basilic, radial, ulnar  and upper cephalic vein. Spectral Doppler was utilized to evaluate flow at rest and with distal augmentation maneuvers. COMPARISON:  Chest CT 10/10/2016 FINDINGS: Contralateral Subclavian Vein: Respiratory phasicity is normal with appropriate response to augmentation. No evidence of  thrombus. Normal compressibility. Internal Jugular Vein: No evidence of thrombus. Normal compressibility, respiratory phasicity and response to augmentation. Subclavian Vein: No evidence of thrombus. Normal compressibility, respiratory phasicity and response to augmentation. Axillary Vein: No evidence of thrombus. Normal compressibility, respiratory phasicity and response to augmentation. Cephalic Vein: No evidence of thrombus. Normal compressibility, respiratory phasicity and response to augmentation. Basilic Vein: No evidence of thrombus. Normal compressibility, respiratory phasicity and response to augmentation. Brachial Veins: No evidence of thrombus. Normal compressibility, respiratory phasicity and response to augmentation. Radial Veins: No evidence of thrombus. Normal compressibility, respiratory phasicity and response to augmentation. Ulnar Veins: No evidence of thrombus.  Normal compressibility. Venous Reflux:  None visualized. Other Findings:  None visualized. IMPRESSION: No evidence of DVT within the right upper extremity. Specifically, no thrombus is identified within the right internal jugular vein. Findings on CT may have been due to flow related artifact. Electronically Signed   By: Ashley Royalty M.D.   On: 10/10/2016 15:00   Dg Chest Port 1 View  Result Date: 10/21/2016 CLINICAL DATA:  Status post bronchoscopy and left lower lung biopsy. EXAM: PORTABLE CHEST 1 VIEW COMPARISON:  CT scan of the chest of October 10, 2016 and chest x-ray of October 08, 2016 FINDINGS: The interstitial markings remain diffusely increased. There is no pneumothorax or significant pleural effusion. The cardiac silhouette is enlarged. The pulmonary vascularity is indistinct. The bony thorax exhibits no acute abnormality. IMPRESSION: No postprocedure complication following bronchoscopy. The interstitial markings remain diffusely increased in a miliary pattern. Electronically Signed   By: David  Martinique M.D.   On:  10/21/2016 13:57   Dg C-arm Bronchoscopy  Result Date: 10/21/2016 C-ARM BRONCHOSCOPY: Fluoroscopy was utilized by the requesting physician.  No radiographic interpretation.    ASSESSMENT: this is a very pleasant 51 years old African-American female with a stage IV (T3, N2, M1 a) non-small cell lung cancer, poorly differentiated adenocarcinoma with extensive miliary distribution in the lungs bilaterally diagnosed in September 2018. Unfortunately there was insufficient tissue material for molecular studies.  PLAN: I had a lengthy discussion with the patient and her family about her current disease stage, prognosis and treatment options. I personally and independently reviewed the scan images and discuss the results and showed the images to the patient and her family. Unfortunately patient has very extensive disease involving the lung. She is scheduled to have a PET scan performed later this week. I also ordered MRI of the brain performed later this week to rule out any brain metastasis. For the molecular studies, I will send blood test to [facility] for comprehensive molecular studies as well as blood test to [facility] for EGFR mutation. This patient is a never smoker and the appearance on the CT scan is highly suspicious to have a molecular abnormalities that could be used for targeted therapy. I also explained to the patient that if she gets symptomatic in the next few days I would be happy to start systemic chemotherapy sooner until the molecular studies becomes available. She would like to wait. I will see her back for follow-up visit next week for more detailed discussion of her treatment options and hopefully the molecular studies will be available at that time. She was advised to call immediately if she has any concerning  symptoms in the interval. The patient voices understanding of current disease status and treatment options and is in agreement with the current care plan.  All questions  were answered. The patient knows to call the clinic with any problems, questions or concerns. We can certainly see the patient much sooner if necessary.  Thank you so much for allowing me to participate in the care of Nancy Moore. I will continue to follow up the patient with you and assist in her care.  I spent 55 minutes counseling the patient face to face. The total time spent in the appointment was 80 minutes.  Disclaimer: This note was dictated with voice recognition software. Similar sounding words can inadvertently be transcribed and may not be corrected upon review.   Eilleen Kempf October 28, 2016, 3:17 PM

## 2016-10-29 ENCOUNTER — Telehealth: Payer: Self-pay | Admitting: Internal Medicine

## 2016-10-29 ENCOUNTER — Ambulatory Visit: Payer: 59

## 2016-10-29 MED ORDER — TRAMADOL HCL 50 MG PO TABS: ORAL_TABLET | ORAL | 0 refills | Status: DC

## 2016-10-29 MED FILL — traMADol HCL 50 MG TABS: 50 | 4 days supply | Qty: 28 | Fill #0

## 2016-10-29 NOTE — Telephone Encounter (Signed)
Spoke with the pt Rx was signed [facility]  Nothing further needed

## 2016-10-29 NOTE — Telephone Encounter (Signed)
Pt requesting a refill on tramadol.  rx prescribed 9/19 #40 with 0 refills, but only #28 dispensed from pharmacy d/t new controlled substance laws.    Pt uses [facility] outpatient pharmacy.  MW please advise on refill.  Thanks.     9/19 AVS: Patient Instructions   Pantoprazole (protonix) 40 mg   Take  30-60 min before first meal of the day and Pepcid (famotidine)  20 mg one @  bedtime until return to office - this is the [place] way to tell whether stomach acid is contributing to your problem.    GERD (REFLUX)  is an extremely common cause of respiratory symptoms just like yours , many times with no obvious heartburn at all.     It can be treated with medication, but also with lifestyle changes including elevation of the head of your bed (ideally with 6 inch  bed blocks),  Smoking cessation, avoidance of late meals, excessive alcohol, and avoid fatty foods, chocolate, peppermint, colas, red wine, and acidic juices such as orange juice.  NO MINT OR MENTHOL PRODUCTS SO NO COUGH DROPS   USE SUGARLESS CANDY INSTEAD (Jolley ranchers or Stover's or Life Savers) or even ice chips will also do - the key is to swallow to prevent all throat clearing. NO OIL BASED VITAMINS - use powdered substitutes.   Take delsym two tsp every 12 hours and supplement if needed with  tramadol 50 mg up to 2 every 4 hours to suppress the urge to cough. Swallowing water or using ice chips/non mint and menthol containing candies (such as lifesavers or sugarless jolly ranchers) are also effective.  You should rest your voice and avoid activities that you know make you cough.   Once you have eliminated the cough for 3 straight days try reducing the tramadol first,  then the delsym as tolerated.     Come to outpatient registration at [facility] (behind the ER) at noon  Tuesday 10/21/16  with nothing to eat or drink after midnight Monday .for Bronchoscopy

## 2016-10-29 NOTE — Telephone Encounter (Signed)
Ok to refill tramadol

## 2016-10-30 ENCOUNTER — Telehealth: Payer: Self-pay

## 2016-10-30 MED ORDER — LORAZEPAM 0.5 MG PO TABS: 0.5000 mg | ORAL_TABLET | Freq: Once | ORAL | 0 refills | Status: AC

## 2016-10-30 NOTE — Telephone Encounter (Signed)
Pt called she is somewhat claustrophobic. She has PET scan tomorrow at 0830. She is asking for sedative to help her. She has MRI on 17th if can get sedative for that as well.  Her MRI on 10/17 was first available at [facility]. Does Dr Julien Nordmann want to look at other facilities? Or move OV with Mikey Bussing on 10/11 to after the MRI?  S/w Hinton Dyer and Dr Julien Nordmann. Hinton Dyer will look into MRI issue in the morning. 1 ativan tablet will be Rx for the MRI. No sedation needed for PET.  After s/w Dr Julien Nordmann pt asked another question:  Pt is asking about getting checked to see if she qualifies for O2 at home. She has had several occasions when pulse ox is initially in the 80s and the tester will make her cough and take deep breaths or will change fingers etc. She does have coughing spells and at times gets SOB walking around house. She would like to get checked at [facility] rather than [facility] at her house b/c she is worried about copay.

## 2016-10-31 ENCOUNTER — Other Ambulatory Visit: Payer: Self-pay | Admitting: *Deleted

## 2016-10-31 ENCOUNTER — Telehealth: Payer: Self-pay | Admitting: Internal Medicine

## 2016-10-31 ENCOUNTER — Ambulatory Visit (HOSPITAL_COMMUNITY)
Admit: 2016-10-31 | Discharge: 2016-10-31 | Disposition: A | Discharge status: Home / Self Care | Payer: 59 | Attending: Internal Medicine | Admitting: Internal Medicine

## 2016-10-31 DIAGNOSIS — D869 Sarcoidosis, unspecified: Secondary | ICD-10-CM | POA: Diagnosis not present

## 2016-10-31 DIAGNOSIS — I313 Pericardial effusion (noninflammatory): Secondary | ICD-10-CM | POA: Diagnosis not present

## 2016-10-31 DIAGNOSIS — J90 Pleural effusion, not elsewhere classified: Secondary | ICD-10-CM | POA: Insufficient documentation

## 2016-10-31 DIAGNOSIS — I700 Atherosclerosis of aorta: Secondary | ICD-10-CM | POA: Diagnosis not present

## 2016-10-31 DIAGNOSIS — R918 Other nonspecific abnormal finding of lung field: Secondary | ICD-10-CM | POA: Diagnosis present

## 2016-10-31 DIAGNOSIS — J341 Cyst and mucocele of nose and nasal sinus: Secondary | ICD-10-CM | POA: Insufficient documentation

## 2016-10-31 LAB — GLUCOSE, CAPILLARY: Glucose-Capillary: 94 mg/dL (ref 65–99)

## 2016-10-31 MED ORDER — FLUDEOXYGLUCOSE F - 18 (FDG) INJECTION
11.6000 | Freq: Once | INTRAVENOUS | Status: AC | PRN
  Administered 2016-10-31: 11.6 via INTRAVENOUS

## 2016-10-31 MED ORDER — LORAZEPAM 0.5 MG PO TABS: 0.5000 mg | ORAL_TABLET | Freq: Once | ORAL | 0 refills | Status: AC

## 2016-10-31 MED FILL — LORazepam 0.5 MG TABS: 0.5 | 1 days supply | Qty: 1 | Fill #0

## 2016-10-31 NOTE — Telephone Encounter (Signed)
Ativan called into pt pharmacy. lmovm for pt to keep scheduled appt for 11/06/16

## 2016-11-03 LAB — EPIDERMAL GROWTH FACTOR RECEPTOR (EGFR) MUTATION ANALYSIS

## 2016-11-03 NOTE — Telephone Encounter (Addendum)
Forms are in Dr Gustavus Bryant lookat

## 2016-11-04 ENCOUNTER — Telehealth: Payer: Self-pay | Admitting: Pharmacy Technician

## 2016-11-04 NOTE — Telephone Encounter (Signed)
done

## 2016-11-04 NOTE — Telephone Encounter (Signed)
Oral Oncology Patient Advocate Encounter  Received notification from [place] that prior authorization for Tagrisso is required.  PA submitted on CoverMyMeds Key [id number] Status is pending  Oral Oncology Clinic will continue to follow.  Fabio Asa. Melynda Keller, [place] Patient [place] [phone] 11/04/2016 9:02 AM

## 2016-11-05 ENCOUNTER — Telehealth: Payer: Self-pay | Admitting: Pharmacist

## 2016-11-05 DIAGNOSIS — C3492 Malignant neoplasm of unspecified part of left bronchus or lung: Secondary | ICD-10-CM

## 2016-11-05 NOTE — Telephone Encounter (Signed)
Oral Oncology Patient Advocate Encounter  Prior Authorization for Newman Nip has been approved.    PA# [id number]  Effective dates: 11/04/2016 through 11/04/2017.  Oral Oncology Clinic will continue to follow.   Fabio Asa. Melynda Keller, [place] Patient [place] [phone] 11/05/2016 8:49 AM

## 2016-11-05 NOTE — Telephone Encounter (Signed)
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  Labs from 10/28/16 assessed, OK for treatment. EKG will be performed at 11/06/16 [facility] visit prior to treatment initiation.  Current medication list in Epic reviewed, no significant DDIs with Tagrisso identified.  Prior authorization has been submitted and approved in anticipation of treatment start. Prescription will need to be sent to [facility] for filling per insurance requirement once it is written.  We will not know copayment due until prescription is processed at the pharmacy, however there is a copayment card available for Tagrisso which should reduce patient's out of pocket cost to $0 once applied.  Oral Oncology Clinic will continue to follow for copayment issues, initial counseling and start date.  Johny Drilling, PharmD, BCPS, BCOP 11/05/2016 3:25 PM Oral Oncology Clinic [phone]

## 2016-11-06 ENCOUNTER — Telehealth: Payer: Self-pay | Admitting: Oncology

## 2016-11-06 ENCOUNTER — Encounter: Payer: Self-pay | Admitting: Oncology

## 2016-11-06 ENCOUNTER — Other Ambulatory Visit: Payer: Self-pay | Admitting: Medical Oncology

## 2016-11-06 ENCOUNTER — Other Ambulatory Visit: Payer: Self-pay | Admitting: Internal Medicine

## 2016-11-06 ENCOUNTER — Telehealth: Payer: Self-pay | Admitting: Pharmacy Technician

## 2016-11-06 ENCOUNTER — Ambulatory Visit (HOSPITAL_BASED_OUTPATIENT_CLINIC_OR_DEPARTMENT_OTHER): Payer: 59 | Admitting: Oncology

## 2016-11-06 VITALS — BP 132/79 | HR 102 | Temp 98.9°F | Resp 18 | Ht 66.0 in | Wt 242.3 lb

## 2016-11-06 DIAGNOSIS — C3492 Malignant neoplasm of unspecified part of left bronchus or lung: Secondary | ICD-10-CM

## 2016-11-06 DIAGNOSIS — C3490 Malignant neoplasm of unspecified part of unspecified bronchus or lung: Secondary | ICD-10-CM | POA: Diagnosis not present

## 2016-11-06 MED ORDER — AFATINIB DIMALEATE 40 MG PO TABS: 40.0000 mg | ORAL_TABLET | Freq: Every day | ORAL | 1 refills | Status: DC

## 2016-11-06 NOTE — Telephone Encounter (Signed)
Patrice - were these forms returned to you?

## 2016-11-06 NOTE — Progress Notes (Signed)
SATURATION QUALIFICATIONS: (This note is used to comply with regulatory documentation for home oxygen)  Patient Saturations on Room Air at Rest = 76%  Patient Saturations on Room Air while Ambulating =78%  Patient Saturations on 3 Liters of oxygen while Ambulating = 92%  Please briefly explain why patient needs home oxygen: new diagnosis Lung Cancer. No other measures improved saturations  TCT Santiago Glad, RN liaison with [facility] with request for home 02 and portable tanks for pt to use for transportation back home.  Santiago Glad arrived approx. 1.5 hours later with 2 portable tanks. She spoke with pt and provided instructions on home 02 use.  Home oxygen to be delivered within next 4-6 hours. Pt has phone # for [facility] to call in 3 hours to check on delivery status. Pt and sister voiced understanding of instructions. Pt escorted to lobby via w/c. Sister driving pt home. Pt aware to call 911 for emergencies or [phone] for any questions or concerns.

## 2016-11-06 NOTE — Progress Notes (Signed)
SATURATION QUALIFICATIONS: (This note is used to comply with regulatory documentation for home oxygen)  Patient Saturations on Room Air at Rest = ***%  Patient Saturations on Room Air while Ambulating = ***%  Patient Saturations on *** Liters of oxygen while Ambulating = ***%  Please briefly explain why patient needs home oxygen: 

## 2016-11-06 NOTE — Progress Notes (Signed)
[facility] Follow up:    Nancy Low, MD [address] [place] [address]   DIAGNOSIS: stage IV (T3, N2, M1 a) non-small cell lung cancer, poorly differentiated adenocarcinoma with extensive miliary distribution in the lungs bilaterally diagnosed in September 2018. 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 NEGATIVE for the Exon 20 T790M mutation  SUMMARY OF ONCOLOGIC HISTORY:  No history exists.    CURRENT THERAPY: Patient will Gilotrif 40 mg daily on 11/08/2016.  INTERVAL HISTORY: Nancy Moore 51 y.o. female returns for routine follow-up with her family members. The patient has ongoing shortness of breath and has coughing spells. She is been using Delsym over-the-counter along with tramadol to help with her cough. She reports that it does help some. She denies fevers and chills. Has pain to her ribs with coughing. No hemoptysis. Denies nausea, vomiting, constipation, diarrhea. The patient was noted to have a Moore oxygen saturation on room air at 76% when she first arrived. O2 was applied. The patient is here to discuss treatment options.   Patient Active Problem List   Diagnosis Date Noted  . Adenocarcinoma of left lung, stage 4 ([facility]) 10/28/2016  . Goals of care, counseling/discussion 10/28/2016  . Upper airway cough syndrome 10/16/2016  . Multiple pulmonary nodules 10/15/2016    is allergic to percocet [oxycodone-acetaminophen] and lisinopril.  MEDICAL HISTORY: Past Medical History:  Diagnosis Date  . Adenocarcinoma of left lung, stage 4 ([facility]) 10/28/2016  . Goals of care, counseling/discussion 10/28/2016  . Hypertension     SURGICAL HISTORY: Past Surgical History:  Procedure Laterality Date  . BREAST EXCISIONAL BIOPSY Left 20+ yrs ago   benign  . VIDEO BRONCHOSCOPY Bilateral 10/21/2016   Procedure: VIDEO BRONCHOSCOPY WITH FLUORO;  Surgeon: Tanda Rockers, MD;  Location: [facility];  Service: Cardiopulmonary;  Laterality:  Bilateral;    SOCIAL HISTORY: Social History   Social History  . Marital status: Married    Spouse name: N/A  . Number of children: N/A  . Years of education: N/A   Occupational History  . Not on file.   Social History Main Topics  . Smoking status: Never Smoker  . Smokeless tobacco: Never Used  . Alcohol use Yes     Comment: socially  . Drug use: No  . Sexual activity: No   Other Topics Concern  . Not on file   Social History Narrative  . No narrative on file    FAMILY HISTORY: Family History  Problem Relation Age of Onset  . Asthma Mother     Review of Systems  Constitutional: Positive for fatigue. Negative for chills and fever.  HENT:  Negative.   Eyes: Negative.   Respiratory: Positive for cough. Negative for hemoptysis.        The patient is short of breath with exertion.  Cardiovascular: Negative.   Gastrointestinal: Negative.   Genitourinary: Negative.    Musculoskeletal: Negative for back pain, neck pain and neck stiffness.       Rib pain with coughing.  Skin: Negative.   Neurological: Negative.   Hematological: Negative.   Psychiatric/Behavioral: Negative.      PHYSICAL EXAMINATION  ECOG PERFORMANCE STATUS: 1 - Symptomatic but completely ambulatory  Vitals:   11/06/16 1411  BP: 132/79  Pulse: (!) 102  Resp: 18  Temp: 98.9 F (37.2 C)  SpO2: (!) 76%  Oxygen saturation was 76% on room air at rest. Oxygen saturation on room air with ambulation was 78%. Oxygen  saturation with 3 L of O2 with ambulation was 92%.  Physical Exam  Constitutional: She is oriented to person, place, and time and well-developed, well-nourished, and in no distress. No distress.  HENT:  Head: Normocephalic.  Mouth/Throat: Oropharynx is clear and moist. No oropharyngeal exudate.  Eyes: Conjunctivae are normal. Right eye exhibits no discharge. Left eye exhibits no discharge. No scleral icterus.  Neck: Normal range of motion. Neck supple.  Cardiovascular: Normal rate,  regular rhythm, normal heart sounds and intact distal pulses.   Pulmonary/Chest: She has no wheezes. She has no rales.  Diminished breath sounds bilaterally. The patient gets short of breath while speaking at times. Oxygen levels as noted in the vital signs.  Abdominal: Soft. Bowel sounds are normal. She exhibits no distension and no mass. There is no tenderness.  Musculoskeletal: She exhibits no edema.  Lymphadenopathy:    She has no cervical adenopathy.  Neurological: She is alert and oriented to person, place, and time. She exhibits normal muscle tone. Coordination normal.  Skin: Skin is warm and dry. No rash noted. She is not diaphoretic. No erythema. No pallor.  Psychiatric: Mood, memory, affect and judgment normal.  Vitals reviewed.   LABORATORY DATA:  CBC    Component Value Date/Time   WBC 11.4 (H) 10/28/2016 1406   RBC 4.48 10/28/2016 1406   HGB 12.9 10/28/2016 1406   HCT 40.2 10/28/2016 1406   PLT 280 10/28/2016 1406   MCV 89.7 10/28/2016 1406   MCH 28.9 10/28/2016 1406   MCHC 32.2 10/28/2016 1406   RDW 14.4 10/28/2016 1406   LYMPHSABS 2.3 10/28/2016 1406   MONOABS 0.9 10/28/2016 1406   EOSABS 0.7 (H) 10/28/2016 1406   BASOSABS 0.1 10/28/2016 1406    CMP     Component Value Date/Time   NA 141 10/28/2016 1406   K 3.9 10/28/2016 1406   CL 99 07/22/2010 2022   CO2 28 10/28/2016 1406   GLUCOSE 97 10/28/2016 1406   BUN 5.2 (L) 10/28/2016 1406   CREATININE 0.9 10/28/2016 1406   CALCIUM 9.5 10/28/2016 1406   PROT 6.9 10/28/2016 1406   ALBUMIN 3.3 (L) 10/28/2016 1406   AST 18 10/28/2016 1406   ALT 11 10/28/2016 1406   ALKPHOS 97 10/28/2016 1406   BILITOT 0.43 10/28/2016 1406    RADIOGRAPHIC STUDIES:  Ct Chest W Contrast  Addendum Date: 10/10/2016   ADDENDUM REPORT: 10/10/2016 10:43 ADDENDUM: These results were called by telephone at the time of interpretation on 10/10/2016 at 10 a.m. to Dr. Wenda Moore , who verbally acknowledged these results. Electronically  Signed   By: Marin Olp M.D.   On: 10/10/2016 10:43   Addendum Date: 10/10/2016   ADDENDUM REPORT: 10/10/2016 09:47 ADDENDUM: Focal elliptical filling defect over the right internal jugular vein which may represent focal thrombus versus artifact. Recommend ultrasound for further evaluation. Electronically Signed   By: Marin Olp M.D.   On: 10/10/2016 09:47   Result Date: 10/10/2016 CLINICAL DATA:  Abnormal chest x-ray. Cough and shortness of breath 4 weeks. Finished course of prednisone. EXAM: CT CHEST WITH CONTRAST TECHNIQUE: Multidetector CT imaging of the chest was performed during intravenous contrast administration. CONTRAST:  37m ISOVUE-300 IOPAMIDOL (ISOVUE-300) INJECTION 61% COMPARISON:  Chest x-ray 10/08/2016.  Abdominal CT 03/31/2013 FINDINGS: Cardiovascular: Heart is normal size. There is a small pericardial effusion with Hounsfield unit measurements of 24. Mild prominence of the ascending thoracic aorta measuring 3.6 cm in AP diameter. No evidence of aortic dissection. Pulmonary arterial system is within  normal. Mediastinum/Nodes: Minimal nodal prominence over the left neck base with the largest node measuring 1.1 cm by short axis. 1 cm right peritracheal lymph node. Few other shotty mediastinal lymph nodes are present. No hilar adenopathy. No axillary adenopathy. Remaining mediastinal structures are within normal. Lungs/Pleura: Lungs are adequately inflated and demonstrate numerous bilateral subcentimeter pulmonary nodules. There is focal area of more prominent coalescing nodules over the superior segment of the left lower lobe with the 2 largest measuring 1.7 x 3 cm and 1.9 x 2.4 cm. No evidence of effusion. Airways are normal. Upper Abdomen: Few subcentimeter periaortic lymph nodes, otherwise unremarkable. Musculoskeletal: Within normal. IMPRESSION: Numerous bilateral subcentimeter pulmonary nodules with focal coalescence of more prominent nodules over the superior segment of the left lower  lobe with the 2 largest measuring 1.7 x 3 cm and 1.9 x 2.4 cm. No effusion. 1 cm right peritracheal lymph node likely reactive. Differential diagnosis would include infection to include granulomatous infection such as tuberculosis particularly giving the prominent superior segment left lower lobe involvement. Would also consider bacterial or fungal infection as well as inflammatory versus metastatic disease. Recommend clinical correlation as consider follow-up CT 4 weeks. If findings unchanged at that time, would then recommend tissue sampling. Small minimally complicated pericardial effusion. Ectasia of the ascending thoracic aorta measuring 3.6 cm. Recommend annual imaging followup by CTA or MRA. This recommendation follows 2010 ACCF/AHA/AATS/ACR/ASA/SCA/SCAI/SIR/STS/SVM Guidelines for the Diagnosis and Management of Patients with Thoracic Aortic Disease. Circulation.2010; 121: W413-K440. Electronically Signed: By: Marin Olp M.D. On: 10/10/2016 09:08   Nm Pet Image Initial (pi) Skull Base To Thigh  Result Date: 10/31/2016 CLINICAL DATA:  Initial treatment strategy for pulmonary nodules. EXAM: NUCLEAR MEDICINE PET SKULL BASE TO THIGH TECHNIQUE: 11.6 mCi F-18 FDG was injected intravenously. Full-ring PET imaging was performed from the skull base to thigh after the radiotracer. CT data was obtained and used for attenuation correction and anatomic localization. FASTING BLOOD GLUCOSE:  Value: 94 mg/dl COMPARISON:  Chest CT dated 10/10/2016 FINDINGS: NECK No hypermetabolic lymph nodes in the neck. Mucous retention cyst in the right maxillary sinus. CHEST Innumerable miliary pulmonary nodules throughout both lungs, some ill definition compared to the prior exam although some of this may be due to breathing motion during imaging. Progressive airspace opacity in the left lower lobe at the site of prior nodularity, with some persistence of the prior nodularity, and new confluent nodular airspace opacity medially in  the superior segment right lower lobe. In general the diffuse nodularity and airspace opacities are very hypermetabolic. For example in the superior segment left lower lobe, the confluent nodularity and airspace opacity has a maximum SUV of 16.7. The airspace opacity in the superior segment right lower lobe has maximum SUV of 11.0. New the scattered nodularity in the lungs has generally heterogeneous signal, with a random sample in the right upper lobe having maximum SUV of 7.6. Left supraclavicular node measuring 1 cm in short axis on image 39/4, maximum SUV 4.8. Enlarged paratracheal and AP window lymph nodes demonstrate Moore-grade metabolic activity. Trace left pleural effusion, new. Pleural thickening along the paraspinal regions bilaterally. There is a notable pericardial effusion, mildly worsened compared to the prior exam, generally photopenic. ABDOMEN/PELVIS No abnormal hypermetabolic activity within the liver, pancreas, adrenal glands, or spleen. No hypermetabolic lymph nodes in the abdomen or pelvis. Aortoiliac atherosclerotic vascular disease. SKELETON In the left posterior T3 vertebral body and pedicle, there is some subtle lucency associated with a focal hypermetabolic lesion, maximum SUV 7.0. IMPRESSION:  1. Hypermetabolic miliary nodules throughout the chest with confluent hypermetabolic airspace opacities in the superior segments of both lower lobes, left greater than right, and worsening compared to 10/10/16. New small left pleural effusion, pleural thickening in the paraspinal regions, and enlarging pericardial effusion. Stable mildly hypermetabolic left supraclavicular node and mildly enlarged but Moore-grade metabolic activity in mediastinal lymph nodes. Finally, there is a hypermetabolic lesion in the left T3 vertebral body and pedicle. Possibilities include miliary infection such as tuberculosis or fungal disease; miliary sarcoidosis; Langerhans cell histiocytosis ; or miliary metastatic disease  with occult primary. Entities such as pneumoconiosis and hemosiderosis are considered less likely. The significant progression compared to 10/10/16 raises concern for infectious etiology even if the patient is afebrile, and sampling is recommended. 2.  Aortic Atherosclerosis (ICD10-I70.0). 3. Mucous retention cysts of the right maxillary sinus. Electronically Signed   By: Van Clines M.D.   On: 10/31/2016 12:05   US Venous Img Upper Uni Right  Result Date: 10/10/2016 CLINICAL DATA:  Pulmonary nodule question thrombus of the right internal jugular vein. EXAM: RIGHT UPPER EXTREMITY VENOUS DOPPLER ULTRASOUND TECHNIQUE: Gray-scale sonography with graded compression, as well as color Doppler and duplex ultrasound were performed to evaluate the upper extremity deep venous system from the level of the subclavian vein and including the jugular, axillary, basilic, radial, ulnar and upper cephalic vein. Spectral Doppler was utilized to evaluate flow at rest and with distal augmentation maneuvers. COMPARISON:  Chest CT 10/10/2016 FINDINGS: Contralateral Subclavian Vein: Respiratory phasicity is normal with appropriate response to augmentation. No evidence of thrombus. Normal compressibility. Internal Jugular Vein: No evidence of thrombus. Normal compressibility, respiratory phasicity and response to augmentation. Subclavian Vein: No evidence of thrombus. Normal compressibility, respiratory phasicity and response to augmentation. Axillary Vein: No evidence of thrombus. Normal compressibility, respiratory phasicity and response to augmentation. Cephalic Vein: No evidence of thrombus. Normal compressibility, respiratory phasicity and response to augmentation. Basilic Vein: No evidence of thrombus. Normal compressibility, respiratory phasicity and response to augmentation. Brachial Veins: No evidence of thrombus. Normal compressibility, respiratory phasicity and response to augmentation. Radial Veins: No evidence of  thrombus. Normal compressibility, respiratory phasicity and response to augmentation. Ulnar Veins: No evidence of thrombus.  Normal compressibility. Venous Reflux:  None visualized. Other Findings:  None visualized. IMPRESSION: No evidence of DVT within the right upper extremity. Specifically, no thrombus is identified within the right internal jugular vein. Findings on CT may have been due to flow related artifact. Electronically Signed   By: Ashley Royalty M.D.   On: 10/10/2016 15:00   Dg Chest Port 1 View  Result Date: 10/21/2016 CLINICAL DATA:  Status post bronchoscopy and left lower lung biopsy. EXAM: PORTABLE CHEST 1 VIEW COMPARISON:  CT scan of the chest of October 10, 2016 and chest x-ray of October 08, 2016 FINDINGS: The interstitial markings remain diffusely increased. There is no pneumothorax or significant pleural effusion. The cardiac silhouette is enlarged. The pulmonary vascularity is indistinct. The bony thorax exhibits no acute abnormality. IMPRESSION: No postprocedure complication following bronchoscopy. The interstitial markings remain diffusely increased in a miliary pattern. Electronically Signed   By: David  Martinique M.D.   On: 10/21/2016 13:57   Dg C-arm Bronchoscopy  Result Date: 10/21/2016 C-ARM BRONCHOSCOPY: Fluoroscopy was utilized by the requesting physician.  No radiographic interpretation.     PATHOLOGY:     ASSESSMENT and THERAPY PLAN:   Adenocarcinoma of left lung, stage 4 ([facility]) This is a very pleasant 51 years old African-American female with a stage  IV (T3, N2, M1 a) non-small cell lung cancer, poorly differentiated adenocarcinoma with extensive miliary distribution in the lungs bilaterally diagnosed in September 2018. Unfortunately there was insufficient tissue material for molecular studies. 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  The patient was seen with Dr. Julien Nordmann. We had a lengthy discussion with the patient and her  family about her current disease stage, prognosis and treatment options. PET scan results were discussed with the patient and her family. Images were shown the images to the patient and her family. Unfortunately patient has very extensive disease involving the lung. MRI of the brain is pending to rule out any brain metastasis.  We discussed use of a targeted therapy such as Tagrisso, Gilotrif, or Tarceva. EKG was done today which shows a prolonged QTc interval. We will not use Tagrisso at this time but may consider in the future if the QTc interval improves. We may also need to consult cardiology for this patient. Plan is to proceed with Gilotrif 40 mg daily. Adverse effects of this medication were discussed with the patient including, but not limited to, rash and diarrhea. She was advised that she would need to use Imodium to help control the diarrhea. The patient is in agreement to proceeding with Gilotrif. Prescription was sent to her pharmacy to obtain this medication as soon as possible.  We have spoken with [facility] who was able to deliver an oxygen tank to our clinic for the patient to take home with her. They will also arrange for home oxygen use.  We will see the patient back about 7-10 days after she begins her targeted therapy to see how she is tolerating it.  She was advised to call immediately if she has any concerning symptoms in the interval. The patient voices understanding of current disease status and treatment options and is in agreement with the current care plan.   Orders Placed This Encounter  Procedures  . CBC with Differential/Platelet    Standing Status:   Future    Standing Expiration Date:   11/06/2017  . Comprehensive metabolic panel    Standing Status:   Future    Standing Expiration Date:   11/06/2017  . EKG 12-Lead    Ordered by an unspecified provider   . EKG 12-Lead    Ordered by an unspecified provider     All questions were answered. The  patient knows to call the clinic with any problems, questions or concerns. We can certainly see the patient much sooner if necessary.  Mikey Bussing, NP 11/07/2016   ADDENDUM: Hematology/Oncology Attending: I had a face to face encounter with the patient. I recommended her care plan. This is a very pleasant 51 years old AAF with recently diagnosed stave IV Non small cell lung Cancer, Adenocarcinoma with positive EGFR mutation in Exon 19 with extensive miliary distribution of her disease in the lungs bilaterally with severe dyspnea. The patient was considered for treatment with Tagrisso as first line therapy but unfortunately QTc was over 530. I discussed with the patient other treatment options including treatment with Gilotrif 40 mg po qd.  I discussed with the patient the adverse effects of this treatment and she also received education from the oral pharmacist.  I'd like this patient to start her treatment as soon as possible because of the significant decline in her condition and worsening dyspnea.  We will also refer her to Cardiology for evaluation of the prolonged QTc interval. I  will see the patient back for follow up visit in 7-10 for reevaluation and management of any adverse effects of her treatment. She was advised to call immediately if she has any concerning symptoms in the interval.  Disclaimer: This note was dictated with voice recognition software. Similar sounding words can inadvertently be transcribed and may be missed upon review. Eilleen Kempf, MD 11/09/16

## 2016-11-06 NOTE — Telephone Encounter (Signed)
Oral Oncology Pharmacist Encounter  EKG performed in office yesterday shows QTc 521 msec, which is too prolonged to safely start Tagrisso at this time (patients with QTc >470 msec were excluded from trials).  Per discussion with MD, Gilotrif (afatinib) will be used instead.  Labs from 10/28/16 assessed, OK for treatment.  Current medication list in Epic reviewed, no DDIs with Gilotrif identified.  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   Counseled patient on administration, dosing, side effects, monitoring, drug-food interactions, safe handling, storage, and disposal.  Patient will take Gilotrif 40 tablets, 1 tablet by mouth once daily, on an empty stomach, 1 hour before or 2 hours after a meal. Gilotrif start date: pending medication acquisition  Side effects include but not limited to: rash, diarrhea, nausea, vomiting, decreased appetitie, fatigue, mouth sores, and interstitial lung disease.  Patient will get some loperamide in case diarrhea develops, and will call the office if experiencing side effects.   Reviewed with patient importance of keeping a medication schedule and plan for any missed doses.  Ms. Backstrom voiced understanding and appreciation.   All questions answered. Medication reconciliation performed and medication/allergy list updated.   Medication acquisition  Prescription was e-scribed to [facility] ([place] service ph: [phone]) for dispensing per insurance requirement on 11/06/16. I called this morning at 9am to ensure Rx was processing. I then called and performed urgent PA over the phone with [facility] and received approval. Ref# [id number] Effective dates: 11/07/16-11/07/17  I called and provided this information to  [facility] at 10:30 this morning. I called pharmacy back at 1pm this afternoon for status update, they are still processing Rx and stated it may be Monday 11/10/16 before prescription would be scheduled for delivery to patient. I then had patient call to move Rx along. I also reached out to manufacturer representative to see if they could help [place] get the prescription moved out today.  Oral Oncology Clinic will continue to follow.  Johny Drilling, PharmD, BCPS, BCOP 11/07/2016  3:22 PM Oral Oncology Clinic [phone]

## 2016-11-06 NOTE — Telephone Encounter (Signed)
Oral Oncology Patient Advocate Encounter  Received notification from [facility] that prior authorization for Gilotrif is required.  PA submitted on CoverMyMeds Key [id number] Status is pending  Oral Oncology Clinic will continue to follow.  Fabio Asa. Melynda Keller, [facility] [place] [phone] 11/06/2016 3:36 PM

## 2016-11-06 NOTE — Telephone Encounter (Signed)
Gave avs and calendar for October

## 2016-11-06 NOTE — Telephone Encounter (Signed)
She had # 28 on 10/29/16 Can we refill today? Please advise thanks

## 2016-11-06 NOTE — Telephone Encounter (Signed)
A refill for Tramadol 43m is being requested to be refilled for this patient. Patient is using for cough or pain 1-2 tabs every 4hrs PRN. Is it okay to refill the medication. Routing message to MW.

## 2016-11-06 NOTE — Telephone Encounter (Signed)
Rec'd completed paperwork back - fwd to [facility] 11/06/16 -pr

## 2016-11-07 ENCOUNTER — Other Ambulatory Visit: Payer: Self-pay

## 2016-11-07 ENCOUNTER — Institutional Professional Consult (permissible substitution): Payer: 59 | Admitting: Pulmonary Disease

## 2016-11-07 MED FILL — traMADol HCL 50 MG TABS: 50 | 3 days supply | Qty: 28 | Fill #0

## 2016-11-07 NOTE — Assessment & Plan Note (Addendum)
This is a very pleasant 51 years old African-American female with a stage IV (T3, N2, M1 a) non-small cell lung cancer, poorly differentiated adenocarcinoma with extensive miliary distribution in the lungs bilaterally diagnosed in September 2018. Unfortunately there was insufficient tissue material for molecular studies. 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  The patient was seen with Dr. Julien Nordmann. We had a lengthy discussion with the patient and her family about her current disease stage, prognosis and treatment options. PET scan results were discussed with the patient and her family. Images were shown the images to the patient and her family. Unfortunately patient has very extensive disease involving the lung. MRI of the brain is pending to rule out any brain metastasis.  We discussed use of a targeted therapy such as Tagrisso, Gilotrif, or Tarceva. EKG was done today which shows a prolonged QTc interval. We will not use Tagrisso at this time but may consider in the future if the QTc interval improves. We may also need to consult cardiology for this patient. Plan is to proceed with Gilotrif 40 mg daily. Adverse effects of this medication were discussed with the patient including, but not limited to, rash and diarrhea. She was advised that she would need to use Imodium to help control the diarrhea. The patient is in agreement to proceeding with Gilotrif. Prescription was sent to her pharmacy to obtain this medication as soon as possible.  We have spoken with [facility] home [facility] who was able to deliver an oxygen tank to our clinic for the patient to take home with her. They will also arrange for home oxygen use.  We will see the patient back about 7-10 days after she begins her targeted therapy to see how she is tolerating it.  She was advised to call immediately if she has any concerning symptoms in the interval. The patient voices understanding of current disease  status and treatment options and is in agreement with the current care plan.

## 2016-11-10 ENCOUNTER — Encounter: Payer: Self-pay | Admitting: Internal Medicine

## 2016-11-10 LAB — GUARDANT 360

## 2016-11-10 NOTE — Telephone Encounter (Signed)
Oral Oncology Pharmacist Encounter  Received call from patient stating she has received her Gilotrif from [place].  Gilotrif start date: 11/08/16  Patient knows to call the office with questions or concerns.  She expressed appreciation for office pushing prescription along for her to start on treatment. She also stated she is more comfortable with the oxygen that was started for her in the office on 10/11.  Oral Oncology Clinic will continue to follow.  Johny Drilling, PharmD, BCPS, BCOP 11/10/2016 8:51 AM Oral Oncology Clinic [phone]

## 2016-11-12 ENCOUNTER — Telehealth: Payer: Self-pay | Admitting: Internal Medicine

## 2016-11-12 ENCOUNTER — Ambulatory Visit
Admission: RE | Admit: 2016-11-12 | Discharge: 2016-11-12 | Disposition: A | Discharge status: Home / Self Care | Payer: 59 | Source: Ambulatory Visit | Attending: Internal Medicine | Admitting: Internal Medicine

## 2016-11-12 ENCOUNTER — Telehealth: Payer: Self-pay

## 2016-11-12 DIAGNOSIS — C3492 Malignant neoplasm of unspecified part of left bronchus or lung: Secondary | ICD-10-CM

## 2016-11-12 DIAGNOSIS — C3490 Malignant neoplasm of unspecified part of unspecified bronchus or lung: Secondary | ICD-10-CM

## 2016-11-12 MED ORDER — GADOBENATE DIMEGLUMINE 529 MG/ML IV SOLN
8.0000 mL | Freq: Once | INTRAVENOUS | Status: AC | PRN
  Administered 2016-11-12: 8 mL via INTRAVENOUS

## 2016-11-12 NOTE — Telephone Encounter (Signed)
Records faxed per request below

## 2016-11-12 NOTE — Telephone Encounter (Signed)
Message attempt at 1323. Unable to be received d/t full voicemail box.   "Hi my name is Dawn I am a Marine scientist with [employer]. I am reviewing a short-term request for disability on patient Nancy Moore. Her DOB is 1966-01-12. And I did leave a message yesterday. And I am looking for some subjective findings to help support the patient out of work. I am looking to see when the patient was seen by Dr. Julien Nordmann, if the patient has been seen yet. And it looks like a bronchoscopy with biopsy was scheduled for 9/25 by Dr. Work, her pulmonologist. I'm looking to see if those results have been completed and what the treatment plan is going forward. Please give me a call back at [phone]. That is a direct, secure line to me. My name is Arrie Aran and this is Central Standard Time. And the fax number is [phone]. And I do need to have any additional medical documents back to the exam room by this afternoon, which is Wednesday, October 17th."   Message routed to Bryson Corona and Dr. Worthy Flank desk RN.

## 2016-11-12 NOTE — Telephone Encounter (Signed)
Called and spoke with Dawn at [facility] and she is aware of the medical records number to call to get the information that she needs.  I did review some things with her but the results of the bronch and the biopsy results were not posted by [facility] so this information could not be shared with her.

## 2016-11-12 NOTE — Telephone Encounter (Signed)
Oral Oncology Patient Advocate Encounter  Prior Authorization for Nancy Moore has been approved.    PA# [id number] Effective dates: 11/07/2016 through 11/07/2017  Oral Oncology Clinic will continue to follow.   Nancy Moore. Nancy Moore, Nancy Moore Patient [facility] [phone] 11/12/2016 8:04 AM

## 2016-11-14 MED FILL — LOSARTAN POTASSIUM 25 MG TA: 25 | 31 days supply | Qty: 31 | Fill #1

## 2016-11-14 MED FILL — traMADol HCL 50 MG TABS: 50 | 3 days supply | Qty: 28 | Fill #0

## 2016-11-18 ENCOUNTER — Telehealth: Payer: Self-pay | Admitting: Pharmacist

## 2016-11-18 NOTE — Telephone Encounter (Signed)
Oral Oncology Pharmacist Encounter  Received call from patient today for follow-up of Gilotrif for the treatment of EGFR mutation positive NSCLC. Gilotrif start date: 11/08/16 Patient has missed 0 doses of Gilotrif 40mg daily on an empty stomach since start date. She is experiencing 2 episodes of diarrhea daily that she is managing with loperamide. She is experiencing mild rash on face and nose she is managing with lotion, she is staying out of the sun. She is experiencing mild fatigue.. These side effects are manageable and will be assessed in greater detail at 11/20/16 office visit.  Patient knows to call the office with questions or concerns.  Oral Oncology Clinic will continue to follow.  Jesse , PharmD, BCPS, BCOP 11/18/2016 2:00 PM Oral Oncology Clinic [phone] 

## 2016-11-19 LAB — FUNGUS CULTURE WITH STAIN

## 2016-11-19 LAB — FUNGUS CULTURE RESULT

## 2016-11-19 LAB — FUNGAL ORGANISM REFLEX

## 2016-11-20 ENCOUNTER — Ambulatory Visit (HOSPITAL_BASED_OUTPATIENT_CLINIC_OR_DEPARTMENT_OTHER): Payer: 59 | Admitting: Oncology

## 2016-11-20 ENCOUNTER — Other Ambulatory Visit (HOSPITAL_BASED_OUTPATIENT_CLINIC_OR_DEPARTMENT_OTHER): Payer: 59

## 2016-11-20 ENCOUNTER — Encounter: Payer: Self-pay | Admitting: Oncology

## 2016-11-20 ENCOUNTER — Encounter: Payer: Self-pay | Admitting: *Deleted

## 2016-11-20 ENCOUNTER — Telehealth: Payer: Self-pay | Admitting: Oncology

## 2016-11-20 VITALS — BP 107/67 | HR 98 | Temp 98.4°F | Resp 18 | Ht 66.0 in | Wt 241.4 lb

## 2016-11-20 DIAGNOSIS — C3492 Malignant neoplasm of unspecified part of left bronchus or lung: Secondary | ICD-10-CM | POA: Diagnosis not present

## 2016-11-20 DIAGNOSIS — Z5111 Encounter for antineoplastic chemotherapy: Secondary | ICD-10-CM

## 2016-11-20 LAB — CBC WITH DIFFERENTIAL/PLATELET
BASO%: 0.3 % (ref 0.0–2.0)
Basophils Absolute: 0.1 10*3/uL (ref 0.0–0.1)
EOS%: 16.3 % — ABNORMAL HIGH (ref 0.0–7.0)
Eosinophils Absolute: 2.3 10*3/uL — ABNORMAL HIGH (ref 0.0–0.5)
HCT: 34.2 % — ABNORMAL LOW (ref 34.8–46.6)
HGB: 10.9 g/dL — ABNORMAL LOW (ref 11.6–15.9)
LYMPH%: 12.3 % — ABNORMAL LOW (ref 14.0–49.7)
MCH: 28.8 pg (ref 25.1–34.0)
MCHC: 31.9 g/dL (ref 31.5–36.0)
MCV: 90.2 fL (ref 79.5–101.0)
MONO#: 1.2 10*3/uL — ABNORMAL HIGH (ref 0.1–0.9)
MONO%: 8.2 % (ref 0.0–14.0)
NEUT#: 9 10*3/uL — ABNORMAL HIGH (ref 1.5–6.5)
NEUT%: 62.9 % (ref 38.4–76.8)
Platelets: 377 10*3/uL (ref 145–400)
RBC: 3.79 10*6/uL (ref 3.70–5.45)
RDW: 14.3 % (ref 11.2–14.5)
WBC: 14.4 10*3/uL — ABNORMAL HIGH (ref 3.9–10.3)
lymph#: 1.8 10*3/uL (ref 0.9–3.3)
nRBC: 0 % (ref 0–0)

## 2016-11-20 LAB — COMPREHENSIVE METABOLIC PANEL
ALT: 26 U/L (ref 0–55)
AST: 25 U/L (ref 5–34)
Albumin: 2.8 g/dL — ABNORMAL LOW (ref 3.5–5.0)
Alkaline Phosphatase: 84 U/L (ref 40–150)
Anion Gap: 10 mEq/L (ref 3–11)
BUN: 9.9 mg/dL (ref 7.0–26.0)
CO2: 26 mEq/L (ref 22–29)
Calcium: 9.4 mg/dL (ref 8.4–10.4)
Chloride: 101 mEq/L (ref 98–109)
Creatinine: 0.9 mg/dL (ref 0.6–1.1)
EGFR: >60 mL/min/{1.73_m2} (ref >60)
Glucose: 98 mg/dl (ref 70–140)
Potassium: 3.9 mEq/L (ref 3.5–5.1)
Sodium: 136 mEq/L (ref 136–145)
Total Bilirubin: 0.5 mg/dL (ref 0.20–1.20)
Total Protein: 6.7 g/dL (ref 6.4–8.3)

## 2016-11-20 MED ORDER — CLINDAMYCIN PHOSPHATE 1 % EX GEL: Freq: Two times a day (BID) | CUTANEOUS | 1 refills | Status: DC

## 2016-11-20 MED FILL — CLINDAMYCIN PH 1% GEL: 1 | 30 days supply | Qty: 30 | Fill #0

## 2016-11-20 NOTE — Progress Notes (Signed)
[facility] Follow up:    Nancy Low, MD [address] [place] [address]   DIAGNOSIS: stage IV (T3, N2, M1 a) non-small cell lung cancer, poorly differentiated adenocarcinoma with extensive miliary distribution in the lungs bilaterally diagnosed in September 2018. 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 NEGATIVE for the Exon 20 T790M mutation  SUMMARY OF ONCOLOGIC HISTORY:  No history exists.    CURRENT THERAPY: Gilotrif 40 mg daily started 11/08/2016.  INTERVAL HISTORY: Nancy Moore 51 y.o. female returns for routine follow-up visit with her sisters. The patient started on Gilotrif approximately 10 days ago. She is tolerating well with the exception of intermittent diarrhea and facial rash. She has been using Imodium and only has diarrhea about twice a week now. She has not put anything on the rash to her face. Denies rash anywhere else on her body. The patient denies fevers and chills. Denies chest pain. She still has dyspnea on exertion and a dry cough. Denies hemoptysis. She is wearing oxygen at 3 L reports that her breathing is much better with the oxygen. When she takes the oxygen off, she has coughing spells. Denies nausea, vomiting, constipation. The patient is here for evaluation and repeat bloodwork.   Patient Active Problem List   Diagnosis Date Noted  . Encounter for antineoplastic chemotherapy 11/20/2016  . Adenocarcinoma of left lung, stage 4 ([facility]) 10/28/2016  . Goals of care, counseling/discussion 10/28/2016  . Upper airway cough syndrome 10/16/2016  . Multiple pulmonary nodules 10/15/2016    is allergic to percocet [oxycodone-acetaminophen] and lisinopril.  MEDICAL HISTORY: Past Medical History:  Diagnosis Date  . Adenocarcinoma of left lung, stage 4 ([facility]) 10/28/2016  . Goals of care, counseling/discussion 10/28/2016  . Hypertension     SURGICAL HISTORY: Past Surgical History:  Procedure Laterality Date  . BREAST  EXCISIONAL BIOPSY Left 20+ yrs ago   benign  . VIDEO BRONCHOSCOPY Bilateral 10/21/2016   Procedure: VIDEO BRONCHOSCOPY WITH FLUORO;  Surgeon: Tanda Rockers, MD;  Location: [facility];  Service: Cardiopulmonary;  Laterality: Bilateral;    SOCIAL HISTORY: Social History   Social History  . Marital status: Married    Spouse name: N/A  . Number of children: N/A  . Years of education: N/A   Occupational History  . Not on file.   Social History Main Topics  . Smoking status: Never Smoker  . Smokeless tobacco: Never Used  . Alcohol use Yes     Comment: socially  . Drug use: No  . Sexual activity: No   Other Topics Concern  . Not on file   Social History Narrative  . No narrative on file    FAMILY HISTORY: Family History  Problem Relation Age of Onset  . Asthma Mother     Review of Systems  Constitutional: Positive for fatigue. Negative for appetite change, chills, fever and unexpected weight change.  HENT:  Negative.   Eyes: Negative.   Respiratory: Positive for cough. Negative for hemoptysis.        Short of breath with exertion  Cardiovascular: Negative.   Gastrointestinal: Positive for diarrhea. Negative for constipation, nausea and vomiting.  Genitourinary: Negative.    Musculoskeletal: Negative.   Skin:       Rash to her face.  Neurological: Negative.   Hematological: Negative.   Psychiatric/Behavioral: Negative.       PHYSICAL EXAMINATION  ECOG PERFORMANCE STATUS: 1 - Symptomatic but completely ambulatory  Vitals:   11/20/16 1510  BP: 107/67  Pulse: 98  Resp: 18  Temp: 98.4 F (36.9 C)  SpO2: 100%    Physical Exam  Constitutional: She is oriented to person, place, and time and well-developed, well-nourished, and in no distress. No distress.  HENT:  Head: Normocephalic and atraumatic.  Mouth/Throat: No oropharyngeal exudate.  Eyes: Conjunctivae are normal. Right eye exhibits no discharge. Left eye exhibits no discharge. No scleral icterus.   Neck: Normal range of motion. Neck supple.  Cardiovascular: Normal rate, regular rhythm, normal heart sounds and intact distal pulses.   Pulmonary/Chest: Effort normal and breath sounds normal. No respiratory distress. She has no wheezes. She has no rales.  Abdominal: Soft. Bowel sounds are normal. She exhibits no mass. There is no tenderness.  Musculoskeletal: Normal range of motion. She exhibits no edema.  Lymphadenopathy:    She has no cervical adenopathy.  Neurological: She is alert and oriented to person, place, and time. She exhibits normal muscle tone. Gait normal. Coordination normal.  Skin: Skin is warm and dry. Rash noted. She is not diaphoretic.  Macular rash to her bilateral cheeks.  Psychiatric: Mood, memory, affect and judgment normal.  Vitals reviewed.   LABORATORY DATA:  CBC    Component Value Date/Time   WBC 14.4 (H) 11/20/2016 1433   RBC 3.79 11/20/2016 1433   HGB 10.9 (L) 11/20/2016 1433   HCT 34.2 (L) 11/20/2016 1433   PLT 377 11/20/2016 1433   MCV 90.2 11/20/2016 1433   MCH 28.8 11/20/2016 1433   MCHC 31.9 11/20/2016 1433   RDW 14.3 11/20/2016 1433   LYMPHSABS 1.8 11/20/2016 1433   MONOABS 1.2 (H) 11/20/2016 1433   EOSABS 2.3 (H) 11/20/2016 1433   BASOSABS 0.1 11/20/2016 1433    CMP     Component Value Date/Time   NA 136 11/20/2016 1433   K 3.9 11/20/2016 1433   CL 99 07/22/2010 2022   CO2 26 11/20/2016 1433   GLUCOSE 98 11/20/2016 1433   BUN 9.9 11/20/2016 1433   CREATININE 0.9 11/20/2016 1433   CALCIUM 9.4 11/20/2016 1433   PROT 6.7 11/20/2016 1433   ALBUMIN 2.8 (L) 11/20/2016 1433   AST 25 11/20/2016 1433   ALT 26 11/20/2016 1433   ALKPHOS 84 11/20/2016 1433   BILITOT 0.50 11/20/2016 1433    RADIOGRAPHIC STUDIES:  Mr Jeri Cos QJ Contrast  Result Date: 11/12/2016 CLINICAL DATA:  Lung cancer staging EXAM: MRI HEAD WITHOUT AND WITH CONTRAST TECHNIQUE: Multiplanar, multiecho pulse sequences of the brain and surrounding structures were  obtained without and with intravenous contrast. CONTRAST:  63m MULTIHANCE GADOBENATE DIMEGLUMINE 529 MG/ML IV SOLN COMPARISON:  Head CT 03/05/2010 FINDINGS: Brain: No abnormal enhancement or swelling to suggest metastatic disease. Unfortunately, postcontrast imaging is moderately motion degraded due to patient coughing. This could obscure small/subtle metastatic deposits. No infarct, hemorrhage, hydrocephalus, or white matter disease. Vascular: Major flow voids are preserved. Skull and upper cervical spine: No focal marrow lesion. Sinuses/Orbits: Mucous retention cysts in the right maxillary sinus. No acute or aggressive finding. Other: Moderately motion degraded IMPRESSION: 1. No evidence of metastatic disease. 2. Unavoidable moderate motion degradation of postcontrast imaging due to uncontrolled coughing. Electronically Signed   By: JMonte FantasiaM.D.   On: 11/12/2016 16:10   Nm Pet Image Initial (pi) Skull Base To Thigh  Result Date: 10/31/2016 CLINICAL DATA:  Initial treatment strategy for pulmonary nodules. EXAM: NUCLEAR MEDICINE PET SKULL BASE TO THIGH TECHNIQUE: 11.6 mCi F-18 FDG was injected intravenously. Full-ring PET imaging was performed  from the skull base to thigh after the radiotracer. CT data was obtained and used for attenuation correction and anatomic localization. FASTING BLOOD GLUCOSE:  Value: 94 mg/dl COMPARISON:  Chest CT dated 10/10/2016 FINDINGS: NECK No hypermetabolic lymph nodes in the neck. Mucous retention cyst in the right maxillary sinus. CHEST Innumerable miliary pulmonary nodules throughout both lungs, some ill definition compared to the prior exam although some of this may be due to breathing motion during imaging. Progressive airspace opacity in the left lower lobe at the site of prior nodularity, with some persistence of the prior nodularity, and new confluent nodular airspace opacity medially in the superior segment right lower lobe. In general the diffuse nodularity and  airspace opacities are very hypermetabolic. For example in the superior segment left lower lobe, the confluent nodularity and airspace opacity has a maximum SUV of 16.7. The airspace opacity in the superior segment right lower lobe has maximum SUV of 11.0. New the scattered nodularity in the lungs has generally heterogeneous signal, with a random sample in the right upper lobe having maximum SUV of 7.6. Left supraclavicular node measuring 1 cm in short axis on image 39/4, maximum SUV 4.8. Enlarged paratracheal and AP window lymph nodes demonstrate Moore-grade metabolic activity. Trace left pleural effusion, new. Pleural thickening along the paraspinal regions bilaterally. There is a notable pericardial effusion, mildly worsened compared to the prior exam, generally photopenic. ABDOMEN/PELVIS No abnormal hypermetabolic activity within the liver, pancreas, adrenal glands, or spleen. No hypermetabolic lymph nodes in the abdomen or pelvis. Aortoiliac atherosclerotic vascular disease. SKELETON In the left posterior T3 vertebral body and pedicle, there is some subtle lucency associated with a focal hypermetabolic lesion, maximum SUV 7.0. IMPRESSION: 1. Hypermetabolic miliary nodules throughout the chest with confluent hypermetabolic airspace opacities in the superior segments of both lower lobes, left greater than right, and worsening compared to 10/10/16. New small left pleural effusion, pleural thickening in the paraspinal regions, and enlarging pericardial effusion. Stable mildly hypermetabolic left supraclavicular node and mildly enlarged but Moore-grade metabolic activity in mediastinal lymph nodes. Finally, there is a hypermetabolic lesion in the left T3 vertebral body and pedicle. Possibilities include miliary infection such as tuberculosis or fungal disease; miliary sarcoidosis; Langerhans cell histiocytosis ; or miliary metastatic disease with occult primary. Entities such as pneumoconiosis and hemosiderosis are  considered less likely. The significant progression compared to 10/10/16 raises concern for infectious etiology even if the patient is afebrile, and sampling is recommended. 2.  Aortic Atherosclerosis (ICD10-I70.0). 3. Mucous retention cysts of the right maxillary sinus. Electronically Signed   By: Van Clines M.D.   On: 10/31/2016 12:05   ASSESSMENT and THERAPY PLAN:   Adenocarcinoma of left lung, stage 4 ([facility]) This is a very pleasant 51 years old African-American female with a stage IV (T3, N2, M1 a) non-small cell lung cancer, poorly differentiated adenocarcinoma with extensive miliary distribution in the lungs bilaterally diagnosed in September 2018. Unfortunately there was insufficient tissue material for molecular studies. 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  The patient is currently on treatment with Gilotrif and tolerating it well with the exception of mild diarrhea and facial rash. We discussed the use of Imodium to help control her diarrhea. She was advised that she may use up to 8 tablets of Imodium per day. If her diarrhea is not controlled and she is taking this much Imodium, she was instructed to call us. We can consider calling in Lomotil at that time. For  the rash on her face, I have prescribed clindamycin gel to be used twice a day. If her rash worsens, may consider prescribing oral minocycline. She was encouraged to continue moisturizer and to use sunscreen if she goes outside. The patient will continue her Gilotrif at the current dose.  Results of her MRI of the brain were discussed with the patient and her family members. The MRI of the brain does not show any evidence of metastatic disease.  The patient will continue on her oxygen at 3 L. May consider titrating down of her oxygen saturations remained stable at her next visit.  The patient had several requests today including a form that was completed for her to obtain a handicap tag.  She also requested help with getting a donut cushion, shower chair, and hand rales for her tongue. Prescriptions were written for these for advanced home care.  The patient had questions regarding financial assistance and applying for disability. Referrals were made to the financial counselor and social work to assist with this.  The patient will be seen back in approximate 2 weeks for repeat lab work and reevaluation.   She was advised to call immediately if she has any concerning symptoms in the interval. The patient voices understanding of current disease status and treatment options and is in agreement with the current care plan.  I spent 30 minutes counseling the patient face to face. The total time spent in the appointment was 45 minutes.    Orders Placed This Encounter  Procedures  . CBC with Differential/Platelet    Standing Status:   Future    Standing Expiration Date:   11/20/2017  . Comprehensive metabolic panel    Standing Status:   Future    Standing Expiration Date:   11/20/2017    All questions were answered. The patient knows to call the clinic with any problems, questions or concerns. We can certainly see the patient much sooner if necessary.  Mikey Bussing, NP 11/21/2016

## 2016-11-20 NOTE — Telephone Encounter (Signed)
Scheduled appt per 10/25 los - Gave pt AVS and calender per los.

## 2016-11-20 NOTE — Progress Notes (Signed)
Oncology Nurse Navigator Documentation  Oncology Nurse Navigator Flowsheets 11/20/2016  Navigator Location [facility]  Navigator Encounter Type Clinic/MDC/I spoke with Nancy Moore today. She is feeling better today. She wanted help with support group information, FA, and CSW for disability.  I spoke to her about support group for patients and gave her a handout.  I will contact FA and CSW regarding financial resources.    Patient Visit Type MedOnc  Treatment Phase Treatment  Barriers/Navigation Needs Coordination of Care;Financial  Interventions Coordination of Care;Referrals  Referrals Financial Counseling;Social Work  Coordination of Care Other  Acuity Level 2  Acuity Level 2 Referrals such as genetics, survivorship;Other  Time Spent with Patient 30

## 2016-11-21 ENCOUNTER — Encounter: Payer: Self-pay | Admitting: *Deleted

## 2016-11-21 ENCOUNTER — Encounter: Payer: Self-pay | Admitting: Internal Medicine

## 2016-11-21 NOTE — Progress Notes (Signed)
[place] Work  Clinical Social Work was referred by Norton Blizzard, thoracic navigator, for assistance with social security disability.  CSW contacted patient by phone- Nancy Moore reported she's on short-term disability through her employer and is unsure if she has long-term disability through employer.  She is interested in applying for social security disability-CSW will make referral for [facility] assistance.  CSW will follow up with patient at a later time to continue to provide support throughout cancer journey.    Maryjean Morn, MSW, LCSW, OSW-C Clinical Social Worker [facility] [phone]

## 2016-11-21 NOTE — Progress Notes (Signed)
Received referral from [facility] regarding patient with financial questions or concerns.  Called patient to introduce myself and to ask about her concerns.  Patient has concerns with bills and what assistance she may possibly receive. Asked patient if she has met her deductible/OOP for her insurance. She states she is sure she has. Advised patient if her plan is set up to pay at 1005 after these have been met she will not receive any bills for the remainder of the year only those prior to these amount being met. Asked patient if her insurance starts over in January, she states it does. Advised patient, she will then receive EOB/bills of what was billed to insurance and how much was paid as well as what her portion would be. Advised patient about the [place] settlement if her balance exceeds $5,000 that she may apply for and she may contact billing for balance if not received a bill. Patient states she has received bills but has not opened them. Advised her the number to billing would be on the bill and they may tell her about any other options that may be available.  Patient mentioned applying for Medicaid. Advised patient that she would have to be 65 or older, blind or disabled unless have large amount of bills to submit. Patient states she will be applying for disability and waiting for Lauren to call her regarding this. Advised her if she does not return to work and is approved for disability, then she would be able to receive Medicaid through disability. Patient verbalized understanding.   Asked patient if she would like to take down my name and number for any additional questions and she states she had just taken her medicine but would call me if needed.  Patient is on oral chemo that does not qualify for the [place].

## 2016-11-21 NOTE — Assessment & Plan Note (Signed)
This is a very pleasant 51 years old African-American female with a stage IV (T3, N2, M1 a) non-small cell lung cancer, poorly differentiated adenocarcinoma with extensive miliary distribution in the lungs bilaterally diagnosed in September 2018. Unfortunately there was insufficient tissue material for molecular studies. 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  The patient is currently on treatment with Gilotrif and tolerating it well with the exception of mild diarrhea and facial rash. We discussed the use of Imodium to help control her diarrhea. She was advised that she may use up to 8 tablets of Imodium per day. If her diarrhea is not controlled and she is taking this much Imodium, she was instructed to call us. We can consider calling in Lomotil at that time. For the rash on her face, I have prescribed clindamycin gel to be used twice a day. If her rash worsens, may consider prescribing oral minocycline. She was encouraged to continue moisturizer and to use sunscreen if she goes outside. The patient will continue her Gilotrif at the current dose.  Results of her MRI of the brain were discussed with the patient and her family members. The MRI of the brain does not show any evidence of metastatic disease.  The patient will continue on her oxygen at 3 L. May consider titrating down of her oxygen saturations remained stable at her next visit.  The patient had several requests today including a form that was completed for her to obtain a handicap tag. She also requested help with getting a donut cushion, shower chair, and hand rales for her tongue. Prescriptions were written for these for advanced home care.  The patient had questions regarding financial assistance and applying for disability. Referrals were made to the financial counselor and social work to assist with this.  The patient will be seen back in approximate 2 weeks for repeat lab work and  reevaluation.   She was advised to call immediately if she has any concerning symptoms in the interval. The patient voices understanding of current disease status and treatment options and is in agreement with the current care plan.  I spent 30 minutes counseling the patient face to face. The total time spent in the appointment was 45 minutes.

## 2016-11-26 ENCOUNTER — Encounter: Payer: Self-pay | Admitting: *Deleted

## 2016-11-27 ENCOUNTER — Other Ambulatory Visit: Payer: Self-pay | Admitting: Internal Medicine

## 2016-11-27 MED FILL — HYDROCHLOROTHIAZIDE 12.5 MG: 12.5 | 30 days supply | Qty: 30 | Fill #1

## 2016-11-27 NOTE — Progress Notes (Signed)
[place] Work  Clinical Social Work met with patient after [employer] disability appointment.  CSW and patient completed [place] and [place] lung cancer initiative applications. CSW spent remainder of visit exploring patient's emotional response to diagnosis/treatment and providing emotional support.  CSW will continue to follow patient throughout cancer experience.  Maryjean Morn, MSW, LCSW, OSW-C Clinical Social Worker [facility] [phone]

## 2016-12-01 ENCOUNTER — Telehealth: Payer: Self-pay | Admitting: *Deleted

## 2016-12-01 NOTE — Telephone Encounter (Signed)
Pt had diarrhea sat vomit sun, took imodium, by Monday no vomit or diarrhea, no fever. Reviewed wit MD, pt to continue to use imodium each each loose, encouraged hydration and to call with fever, vomiting. Pt verbalized understanding no further concerns.

## 2016-12-04 LAB — ACID FAST CULTURE WITH REFLEXED SENSITIVITIES (MYCOBACTERIA): Acid Fast Culture: NEGATIVE

## 2016-12-05 ENCOUNTER — Telehealth: Payer: Self-pay | Admitting: Emergency Medicine

## 2016-12-05 ENCOUNTER — Ambulatory Visit (HOSPITAL_BASED_OUTPATIENT_CLINIC_OR_DEPARTMENT_OTHER): Payer: 59 | Admitting: Oncology

## 2016-12-05 ENCOUNTER — Telehealth: Payer: Self-pay | Admitting: Internal Medicine

## 2016-12-05 ENCOUNTER — Other Ambulatory Visit: Payer: 59

## 2016-12-05 ENCOUNTER — Other Ambulatory Visit (HOSPITAL_BASED_OUTPATIENT_CLINIC_OR_DEPARTMENT_OTHER): Payer: 59

## 2016-12-05 VITALS — BP 116/79 | HR 108 | Temp 98.2°F | Resp 18 | Ht 66.0 in | Wt 236.1 lb

## 2016-12-05 DIAGNOSIS — C3492 Malignant neoplasm of unspecified part of left bronchus or lung: Secondary | ICD-10-CM

## 2016-12-05 DIAGNOSIS — Z5111 Encounter for antineoplastic chemotherapy: Secondary | ICD-10-CM

## 2016-12-05 DIAGNOSIS — R21 Rash and other nonspecific skin eruption: Secondary | ICD-10-CM | POA: Diagnosis not present

## 2016-12-05 LAB — COMPREHENSIVE METABOLIC PANEL
ALT: 41 U/L (ref 0–55)
AST: 40 U/L — ABNORMAL HIGH (ref 5–34)
Albumin: 3 g/dL — ABNORMAL LOW (ref 3.5–5.0)
Alkaline Phosphatase: 91 U/L (ref 40–150)
Anion Gap: 13 mEq/L — ABNORMAL HIGH (ref 3–11)
BUN: 10 mg/dL (ref 7.0–26.0)
CO2: 26 mEq/L (ref 22–29)
Calcium: 9.4 mg/dL (ref 8.4–10.4)
Chloride: 98 mEq/L (ref 98–109)
Creatinine: 1 mg/dL (ref 0.6–1.1)
EGFR: >60 mL/min/{1.73_m2} (ref >60)
Glucose: 106 mg/dl (ref 70–140)
Potassium: 3.7 mEq/L (ref 3.5–5.1)
Sodium: 137 mEq/L (ref 136–145)
Total Bilirubin: 0.81 mg/dL (ref 0.20–1.20)
Total Protein: 7.1 g/dL (ref 6.4–8.3)

## 2016-12-05 LAB — CBC WITH DIFFERENTIAL/PLATELET
BASO%: 0.7 % (ref 0.0–2.0)
Basophils Absolute: 0.1 10*3/uL (ref 0.0–0.1)
EOS%: 8.3 % — ABNORMAL HIGH (ref 0.0–7.0)
Eosinophils Absolute: 0.8 10*3/uL — ABNORMAL HIGH (ref 0.0–0.5)
HCT: 31.4 % — ABNORMAL LOW (ref 34.8–46.6)
HGB: 9.9 g/dL — ABNORMAL LOW (ref 11.6–15.9)
LYMPH%: 9.2 % — ABNORMAL LOW (ref 14.0–49.7)
MCH: 27.4 pg (ref 25.1–34.0)
MCHC: 31.6 g/dL (ref 31.5–36.0)
MCV: 86.6 fL (ref 79.5–101.0)
MONO#: 1 10*3/uL — ABNORMAL HIGH (ref 0.1–0.9)
MONO%: 10.2 % (ref 0.0–14.0)
NEUT#: 7.3 10*3/uL — ABNORMAL HIGH (ref 1.5–6.5)
NEUT%: 71.6 % (ref 38.4–76.8)
Platelets: 393 10*3/uL (ref 145–400)
RBC: 3.62 10*6/uL — ABNORMAL LOW (ref 3.70–5.45)
RDW: 14.9 % — ABNORMAL HIGH (ref 11.2–14.5)
WBC: 10.1 10*3/uL (ref 3.9–10.3)
lymph#: 0.9 10*3/uL (ref 0.9–3.3)

## 2016-12-05 MED ORDER — PROCHLORPERAZINE MALEATE 10 MG PO TABS: 10.0000 mg | ORAL_TABLET | Freq: Four times a day (QID) | ORAL | 1 refills | Status: DC | PRN

## 2016-12-05 NOTE — Progress Notes (Signed)
[facility] OFFICE PROGRESS NOTE  [name], MD [address] [place] [address]  DIAGNOSIS: stage IV (T3, N2, M1 a) non-small cell lung cancer, poorly differentiated adenocarcinoma with extensive miliary distribution in the lungs bilaterally diagnosed in September 2018. 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 NEGATIVE for the Exon 20 T790M mutation  PRIOR THERAPY: None  CURRENT THERAPY: Gilotrif 40 mg daily started 11/08/2016.  INTERVAL HISTORY: Nancy Moore 51 y.o. female returns for routine follow-up visit with her sisters. The patient continues on Gilotrif. She is tolerating well with the exception of intermittent diarrhea and facial rash. She has been using Imodium and only has diarrhea about twice a week now. She has been putting Clindamycin gel to the rash to her face with improvment. Denies rash anywhere else on her body. The patient denies fevers and chills. Denies chest pain. She still has dyspnea on exertion and a dry cough. Denies hemoptysis. She is wearing oxygen at 3 L reports that her breathing is much better with the oxygen. She has one episode of nausea and vomited once. She requests a prescription for an antiemetic. Denies constipation. Appetite is fair, but she has lost weight. The patient is here for evaluation and repeat bloodwork.  MEDICAL HISTORY: Past Medical History:  Diagnosis Date  . Adenocarcinoma of left lung, stage 4 ([facility]) 10/28/2016  . Goals of care, counseling/discussion 10/28/2016  . Hypertension     ALLERGIES:  is allergic to percocet [oxycodone-acetaminophen] and lisinopril.  MEDICATIONS:  Current Outpatient Medications  Medication Sig Dispense Refill  . afatinib dimaleate (GILOTRIF) 40 MG tablet Take 1 tablet (40 mg total) by mouth daily. Take on an empty stomach 1hr before or 2 hrs after meals. 30 tablet 1  . clindamycin (CLINDAGEL) 1 % gel Apply topically 2 (two) times daily. 30 g 1  .  Dextromethorphan-Guaifenesin (DELSYM COUGH/CHEST CONGEST DM) 5-100 MG/5ML LIQD Take 10 mLs by mouth 2 (two) times daily as needed (coughing).    Marland Kitchen ipratropium (ATROVENT) 0.03 % nasal spray Place 2 sprays into both nostrils daily.    Marland Kitchen loperamide (IMODIUM) 1 MG/5ML solution Take 2 mg as needed by mouth for diarrhea or loose stools.    . traMADol (ULTRAM) 50 MG tablet TAKE 1 TO 2 TABLETS BY MOUTH EVERY 4 HOURS AS NEEDED FOR COUGH OR PAIN 28 tablet 0  . famotidine (PEPCID) 20 MG tablet One at bedtime (Patient not taking: Reported on 12/05/2016) 30 tablet 2  . hydrochlorothiazide (HYDRODIURIL) 12.5 MG tablet Take 12.5 mg by mouth daily.    Marland Kitchen levocetirizine (XYZAL) 5 MG tablet Take 5 mg by mouth every evening.    Marland Kitchen losartan (COZAAR) 25 MG tablet Take 25 mg by mouth daily.    . pantoprazole (PROTONIX) 40 MG tablet Take 1 tablet (40 mg total) by mouth daily. Take 30-60 min before first meal of the day (Patient not taking: Reported on 12/05/2016) 30 tablet 2  . prochlorperazine (COMPAZINE) 10 MG tablet Take 1 tablet (10 mg total) every 6 (six) hours as needed by mouth for nausea or vomiting. 30 tablet 1   No current facility-administered medications for this visit.     SURGICAL HISTORY:  Past Surgical History:  Procedure Laterality Date  . BREAST EXCISIONAL BIOPSY Left 20+ yrs ago   benign    REVIEW OF SYSTEMS:   Review of Systems  Constitutional: Negative for chills, fatigue, fever. Weight down 5 lbs.   HENT:   Negative for mouth sores,  nosebleeds, sore throat and trouble swallowing.   Eyes: Negative for eye problems and icterus.  Respiratory: Negative for hemoptysis, shortness of breath at rest and wheezing.  Positive for cough and shortness of breath with exertion. Cardiovascular: Negative for chest pain and leg swelling.  Gastrointestinal: Negative for abdominal pain, constipation, nausea and vomiting. Positive for diarrhea. Genitourinary: Negative for bladder incontinence, difficulty  urinating, dysuria, frequency and hematuria.   Musculoskeletal: Negative for back pain, gait problem, neck pain and neck stiffness.  Skin: Positive for rash to face which is improving.  Neurological: Negative for dizziness, extremity weakness, gait problem, headaches, light-headedness and seizures.  Hematological: Negative for adenopathy. Does not bruise/bleed easily.  Psychiatric/Behavioral: Negative for confusion, depression and sleep disturbance. The patient is not nervous/anxious.     PHYSICAL EXAMINATION:  Blood pressure 116/79, pulse (!) 108, temperature 98.2 F (36.8 C), temperature source Oral, resp. rate 18, height _0  (1.676 m), weight 236 lb 1.6 oz (107.1 kg), last menstrual period 03/15/2010, SpO2 100 %.  O2 sat 95-97% on 2 L/min. O2 sat dropped to 82% on RA while ambulating.  ECOG PERFORMANCE STATUS: 1 - Symptomatic but completely ambulatory  Physical Exam  Constitutional: Oriented to person, place, and time and well-developed, well-nourished, and in no distress. No distress.  HENT:  Head: Normocephalic and atraumatic.  Mouth/Throat: Oropharynx is clear and moist. No oropharyngeal exudate.  Eyes: Conjunctivae are normal. Right eye exhibits no discharge. Left eye exhibits no discharge. No scleral icterus.  Neck: Normal range of motion. Neck supple.  Cardiovascular: Normal rate, regular rhythm, normal heart sounds and intact distal pulses.   Pulmonary/Chest: Effort normal and breath sounds normal. No respiratory distress. No wheezes. No rales.  Abdominal: Soft. Bowel sounds are normal. Exhibits no distension and no mass. There is no tenderness.  Musculoskeletal: Normal range of motion. Exhibits no edema.  Lymphadenopathy:    No cervical adenopathy.  Neurological: Alert and oriented to person, place, and time. Exhibits normal muscle tone. Gait normal. Coordination normal.  Skin: Skin is warm and dry. Not diaphoretic. No erythema. No pallor. Faint rash to face. Psychiatric:  Mood, memory and judgment normal.  Vitals reviewed.  LABORATORY DATA: Lab Results  Component Value Date   WBC 10.1 12/05/2016   HGB 9.9 (L) 12/05/2016   HCT 31.4 (L) 12/05/2016   MCV 86.6 12/05/2016   PLT 393 12/05/2016      Chemistry      Component Value Date/Time   NA 137 12/05/2016 0908   K 3.7 12/05/2016 0908   CL 99 07/22/2010 2022   CO2 26 12/05/2016 0908   BUN 10.0 12/05/2016 0908   CREATININE 1.0 12/05/2016 0908      Component Value Date/Time   CALCIUM 9.4 12/05/2016 0908   ALKPHOS 91 12/05/2016 0908   AST 40 (H) 12/05/2016 0908   ALT 41 12/05/2016 0908   BILITOT 0.81 12/05/2016 0908       RADIOGRAPHIC STUDIES:  Mr Jeri Cos MA Contrast  Result Date: 11/12/2016 CLINICAL DATA:  Lung cancer staging EXAM: MRI HEAD WITHOUT AND WITH CONTRAST TECHNIQUE: Multiplanar, multiecho pulse sequences of the brain and surrounding structures were obtained without and with intravenous contrast. CONTRAST:  86m MULTIHANCE GADOBENATE DIMEGLUMINE 529 MG/ML IV SOLN COMPARISON:  Head CT 03/05/2010 FINDINGS: Brain: No abnormal enhancement or swelling to suggest metastatic disease. Unfortunately, postcontrast imaging is moderately motion degraded due to patient coughing. This could obscure small/subtle metastatic deposits. No infarct, hemorrhage, hydrocephalus, or white matter disease. Vascular: Major flow voids are  preserved. Skull and upper cervical spine: No focal marrow lesion. Sinuses/Orbits: Mucous retention cysts in the right maxillary sinus. No acute or aggressive finding. Other: Moderately motion degraded IMPRESSION: 1. No evidence of metastatic disease. 2. Unavoidable moderate motion degradation of postcontrast imaging due to uncontrolled coughing. Electronically Signed   By: Monte Fantasia M.D.   On: 11/12/2016 16:10     ASSESSMENT/PLAN:  Adenocarcinoma of left lung, stage 4 (HCC) This is a very pleasant 51 year old African-American female with a stage IV (T3, N2, M1 a) non-small  cell lung cancer, poorly differentiated adenocarcinoma with extensive miliary distribution in the lungs bilaterally diagnosed in September 2018. Unfortunately there was insufficient tissue material for molecular studies. 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  The patient is currently on treatment with Gilotrif and tolerating it well with the exception of mild diarrhea and facial rash.   The patient was seen with Dr. Julien Nordmann. The patient will continue Gilotrif at the current dose. She will have restaging CT scans for the chest, abdomen, and pelvis prior to her next visit.   Encouraged her to use Imodium every morning to try to prevent diarrhea. She may use additional doses as needed during the day.  For the rash on her face, she will continue clindamycin gel to be used twice a day. The patient will continue her Gilotrif at the current dose.  The patient will continue on her oxygen at 2-3 L. May consider titrating down of her oxygen saturations remained stable at her next visit.  The patient will be seen back in approximate 4 weeks for repeat lab work and to discuss her CT scan results.   She was advised to call immediately if she has any concerning symptoms in the interval. The patient voices understanding of current disease status and treatment options and is in agreement with the current care plan.  Orders Placed This Encounter  Procedures  . CT ABDOMEN PELVIS W CONTRAST    Standing Status:   Future    Standing Expiration Date:   12/05/2017    Order Specific Question:   If indicated for the ordered procedure, I authorize the administration of contrast media per Radiology protocol    Answer:   Yes    Order Specific Question:   Preferred imaging location?    Answer:   [facility]    Order Specific Question:   Radiology Contrast Protocol - do NOT remove file path    Answer:   \\[email or web address]    Order Specific  Question:   Reason for Exam additional comments    Answer:   lung cancer. restaging.    Order Specific Question:   Is patient pregnant?    Answer:   No  . CT CHEST W CONTRAST    Standing Status:   Future    Standing Expiration Date:   12/05/2017    Order Specific Question:   If indicated for the ordered procedure, I authorize the administration of contrast media per Radiology protocol    Answer:   Yes    Order Specific Question:   Preferred imaging location?    Answer:   [facility]    Order Specific Question:   Radiology Contrast Protocol - do NOT remove file path    Answer:   \\[email or web address]    Order Specific Question:   Reason for Exam additional comments    Answer:   lung cancer. restaging.    Order Specific Question:  Is patient pregnant?    Answer:   No  . CBC with Differential/Platelet    Standing Status:   Future    Standing Expiration Date:   12/05/2017  . Comprehensive metabolic panel    Standing Status:   Future    Standing Expiration Date:   12/05/2017  . EKG 12-Lead  . EKG 12-Lead    Ordered by an unspecified provider   . EKG 12-Lead    Ordered by an unspecified provider     Mikey Bussing, DNP, AGPCNP-BC, AOCNP 12/07/16  ADDENDUM: Hematology/Oncology Attending: I had a face-to-face encounter with the patient.  I recommended her care plan.  This is a very pleasant 51 years old African-American female who was recently diagnosed with a stage IV non-small cell lung cancer, adenocarcinoma with positive for EGFR mutation with deletion in exon 19.  The patient was a started on treatment with Gilotrif 40 mg p.o. daily and has been tolerating this treatment well except for mild skin rash and few episodes of diarrhea.  She started having some improvement in her breathing condition but she is still on home oxygen.  She was not a candidate for treatment with Tagrisso as first-line because of QTc prolongation seen on her initial EKG  evaluation. I recommended for the patient to continue her current treatment with GILOTRIF with the same dose. For the diarrhea she was advised to take Imodium after every loose stool for a total of 16 mg p.o. Daily. For the skin rash, she will continue on clindamycin lotion. I would see the patient back for follow-up visit in 4 weeks for evaluation after repeating CT scan of the chest, abdomen pelvis for restaging of her disease. The patient was advised to call immediately if she has any concerning symptoms in the interval.  Disclaimer: This note was dictated with voice recognition software. Similar sounding words can inadvertently be transcribed and may be missed upon review. Eilleen Kempf, MD 12/08/16

## 2016-12-05 NOTE — Telephone Encounter (Signed)
Walked pt  W/o o2 per Nancy Moore ,NP request. Pt sats dropped to 82% on RA with ambulation. Pt put back on o2 @ 2L.

## 2016-12-05 NOTE — Telephone Encounter (Signed)
Scheduled appt per 11/9 los - patietn request Thursday afternoons only - scheduled per patient request.

## 2016-12-07 ENCOUNTER — Encounter: Payer: Self-pay | Admitting: Oncology

## 2016-12-07 NOTE — Assessment & Plan Note (Signed)
This is a very pleasant 51 year old African-American female with a stage IV (T3, N2, M1 a) non-small cell lung cancer, poorly differentiated adenocarcinoma with extensive miliary distribution in the lungs bilaterally diagnosed in September 2018. Unfortunately there was insufficient tissue material for molecular studies. 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  The patient is currently on treatment with Gilotrif and tolerating it well with the exception of mild diarrhea and facial rash.   The patient was seen with Dr. Julien Nordmann. The patient will continue Gilotrif at the current dose. She will have restaging CT scans for the chest, abdomen, and pelvis prior to her next visit.   Encouraged her to use Imodium every morning to try to prevent diarrhea. She may use additional doses as needed during the day.  For the rash on her face, she will continue clindamycin gel to be used twice a day. The patient will continue her Gilotrif at the current dose.  The patient will continue on her oxygen at 2-3 L. May consider titrating down of her oxygen saturations remained stable at her next visit.  The patient will be seen back in approximate 4 weeks for repeat lab work and to discuss her CT scan results.   She was advised to call immediately if she has any concerning symptoms in the interval. The patient voices understanding of current disease status and treatment options and is in agreement with the current care plan.

## 2016-12-09 ENCOUNTER — Telehealth: Payer: Self-pay | Admitting: Internal Medicine

## 2016-12-09 NOTE — Telephone Encounter (Signed)
Printed pathology report and advised pt it will be placed up front for pick up. Nothing further is needed.

## 2016-12-15 ENCOUNTER — Telehealth: Payer: Self-pay | Admitting: Internal Medicine

## 2016-12-15 ENCOUNTER — Telehealth: Payer: Self-pay | Admitting: *Deleted

## 2016-12-15 ENCOUNTER — Telehealth: Payer: Self-pay | Admitting: Pharmacist

## 2016-12-15 NOTE — Telephone Encounter (Signed)
Pt would like to establish care. Provider is scheduled out to February. Pt would like to know if provider would see her sooner that next available?   Please advise.   CB: [phone]

## 2016-12-15 NOTE — Telephone Encounter (Signed)
Ok to bring her to see Cyril Mourning in 1-2 days.

## 2016-12-15 NOTE — Telephone Encounter (Signed)
"  Calling in for Dr. Julien Nordmann.  I have spoken to [facility] nut want to report side effects to Dr. Julien Nordmann.  I didn't know where everything lies or if I need to make sure I do not need to do something else.  I have a rash that's still moving tl lower body.  Rash is red, raised bumps .  Drainage or discharge from waste down that has a discharge,  (Vaginal).  First available appointment with OB is December 30, 2016.  Not seen in three years, being treated as a new patient.  Working on a primary care provider as well.    Not having BM daily but when they move it's soft diarrhea.  May not be eating enough for solid BM's.  Drink Ensure, and soft foods.  Return umber [phone]."

## 2016-12-15 NOTE — Telephone Encounter (Signed)
Oral Oncology Pharmacist Encounter  Received call from patient with several complaints:  Rash spreading to lower back and wrapping around her right side. Had discussed previously with provider and had started using some hydrocortisone cream on the areas as well  Being controlled on 2.0L O2  No diarrhea in 2-3 days  Decided against taking medication in the evening, tried night-time dosing for 2-3 day, that did not work or her due to symptom management and has switched back to morning dosing  Patient noted symptoms similar to vaginal candidiasis, was instructed to follow-up with PCP for evaluation and medical management if indicated. Patient wondering if symptoms related to Gilotrif, unsure of link however patient may be at increased risk of infections and skin breakdown.   Patient knows to call the office with further questions or concerns, and if her symptoms do not improve.  Oral Oncology Clinic will continue to follow.  Johny Drilling, PharmD, BCPS, BCOP 12/15/2016 10:01 AM Oral Oncology Clinic [phone]

## 2016-12-16 ENCOUNTER — Telehealth: Payer: Self-pay | Admitting: Oncology

## 2016-12-16 NOTE — Progress Notes (Signed)
[facility] [address] [place], [place] [address] [phone]  Date:  12/17/2016   Name:  Nancy Moore   DOB:  [birth year]   MRN:  [id number]  PCP:  Darreld Mclean, MD    Chief Complaint: Establish Care (Pt here to est care. Would like to have incision looked at. )   History of Present Illness:  Nancy Moore is a 51 y.o. very pleasant female patient who presents with the following:  Here today to establish care as a new patient  She was dx with lung cancer this past September- prior to this she had been in good health She was dx originally due to cough-  This led to a CXR, a CT and her diagnosis  Her oncologist is Dr. Earlie Server and Dr. Melvyn Novas is her pulmonologist   She is on oral chemo- surgery is not an option for her at this time Her cancer is stage 4 No radiation is planned at this time She started using oxygen over the last couple of months - uses this 24/7, 2-3L  She had several pelvic operations in the past-  Myomectomy x2, hernia repair, partial hyst- most recent operation was about 10 years ago This old operative site seemed to start oozing with a foul odor maybe a week ago- she is not sure if the old incision is opening up It does not hurt any more than at baseline The area has felt hot No fever or chills however She is no longer having any vomiting  She is divorced She was working for [employer], Advertising copywriter services.   She is not working right now due to her illness Never a smoker, no family history of lung cancer Her oncologist had mentioned decreasing her BP meds due to soft BP since she got sick   BP Readings from Last 3 Encounters:  12/17/16 104/72  12/05/16 116/79  11/20/16 107/67     Patient Active Problem List   Diagnosis Date Noted  . Encounter for antineoplastic chemotherapy 11/20/2016  . Adenocarcinoma of left lung, stage 4 ([facility]) 10/28/2016  . Goals of care,  counseling/discussion 10/28/2016  . Upper airway cough syndrome 10/16/2016  . Multiple pulmonary nodules 10/15/2016    Past Medical History:  Diagnosis Date  . Adenocarcinoma of left lung, stage 4 ([facility]) 10/28/2016  . Goals of care, counseling/discussion 10/28/2016  . Hypertension     Past Surgical History:  Procedure Laterality Date  . BREAST EXCISIONAL BIOPSY Left 20+ yrs ago   benign  . VIDEO BRONCHOSCOPY Bilateral 10/21/2016   Procedure: VIDEO BRONCHOSCOPY WITH FLUORO;  Surgeon: Tanda Rockers, MD;  Location: [facility];  Service: Cardiopulmonary;  Laterality: Bilateral;    Social History   Tobacco Use  . Smoking status: Never Smoker  . Smokeless tobacco: Never Used  Substance Use Topics  . Alcohol use: Yes    Comment: socially  . Drug use: No    Family History  Problem Relation Age of Onset  . Asthma Mother     Allergies  Allergen Reactions  . Percocet [Oxycodone-Acetaminophen] Other (See Comments)    "makes me dizzy"  . Lisinopril Cough    Medication list has been reviewed and updated.  Current Outpatient Medications on File Prior to Visit  Medication Sig Dispense Refill  . afatinib dimaleate (GILOTRIF) 40 MG tablet Take 1 tablet (40 mg total) by mouth daily. Take on an empty stomach 1hr before or  2 hrs after meals. 30 tablet 1  . clindamycin (CLINDAGEL) 1 % gel Apply topically 2 (two) times daily. 30 g 1  . Dextromethorphan-Guaifenesin (DELSYM COUGH/CHEST CONGEST DM) 5-100 MG/5ML LIQD Take 10 mLs by mouth 2 (two) times daily as needed (coughing).    Marland Kitchen loperamide (IMODIUM) 1 MG/5ML solution Take 2 mg as needed by mouth for diarrhea or loose stools.    . prochlorperazine (COMPAZINE) 10 MG tablet Take 1 tablet (10 mg total) every 6 (six) hours as needed by mouth for nausea or vomiting. 30 tablet 1  . traMADol (ULTRAM) 50 MG tablet TAKE 1 TO 2 TABLETS BY MOUTH EVERY 4 HOURS AS NEEDED FOR COUGH OR PAIN 28 tablet 0  . hydrochlorothiazide (HYDRODIURIL) 12.5 MG  tablet Take 12.5 mg by mouth daily.    Marland Kitchen losartan (COZAAR) 25 MG tablet Take 25 mg by mouth daily.     No current facility-administered medications on file prior to visit.     Review of Systems:  As per HPI- otherwise negative. No ST No fever No rash   Physical Examination: Vitals:   12/17/16 1152  BP: 104/72  Pulse: (!) 102  Temp: 97.9 F (36.6 C)  SpO2: 99%   Vitals:   12/17/16 1152  Weight: 233 lb (105.7 kg)  Height: 5\' 6"  (1.676 m)   Body mass index is 37.61 kg/m. Ideal Body Weight: Weight in (lb) to have BMI = 25: 154.6  GEN: WDWN, NAD, Non-toxic, A & O x 3, not acutely ill in appearance but does look chronically ill Wearing O2 via [place] HEENT: Atraumatic, Normocephalic. Neck supple. No masses, No LAD.  Bilateral TM wnl, oropharynx normal.  PEERL,EOMI.   Ears and Nose: No external deformity. CV: RRR, No M/G/R. No JVD. No thrill. No extra heart sounds. PULM: CTA B, no wheezes, crackles, rhonchi. No retractions. No resp. distress. No accessory muscle use. ABD: S, ND, +BS. No rebound. No HSM. EXTR: No c/c/e NEURO Normal gait.  PSYCH: Normally interactive. Conversant. Not depressed or anxious appearing.  Calm demeanor.  Her lower abdomen displays an old healed scar at the bikini line.  This scar has caused a small skin fold, and there appears to be candida intertrigo infection with possible bacterial superinfection of this area.  It is oozing slightly and has an odor, is beefy red in appearance. This area is tender but otherwise the belly is benign    Assessment and Plan: Candidal intertrigo  Cellulitis of abdominal wall - Plan: cephALEXin (KEFLEX) 500 MG capsule  Adenocarcinoma of left lung, stage 4 (HCC)  Oxygen dependent  Hypotension due to drugs  Here today to establish care - she sadly was diagnosed with stage 4 lung cancer just a couple of months ago She is here today with concern of an infection of her abdominal wall- see above Start treatment with a  topical antifungal and also po abx.   Discussed strategies for keeping the area cool and dry Asked her to hold her losartan as BP is soft  She will see me next week for a recheck and is advised to seek care if not doing ok over the holiday weekend   Signed Lamar Blinks, MD

## 2016-12-16 NOTE — Telephone Encounter (Signed)
Ok, that is fine.  She will need 30 minutes though to establish care, can put 2 15 minute spots together

## 2016-12-16 NOTE — Telephone Encounter (Signed)
Left message for patient regarding appt added per 11/20 sch msg.

## 2016-12-16 NOTE — Telephone Encounter (Signed)
Great. Pt has been scheduled.

## 2016-12-17 ENCOUNTER — Encounter: Payer: 59 | Admitting: Medical

## 2016-12-17 ENCOUNTER — Ambulatory Visit (INDEPENDENT_AMBULATORY_CARE_PROVIDER_SITE_OTHER): Payer: 59 | Admitting: Family Medicine

## 2016-12-17 VITALS — BP 104/72 | HR 102 | Temp 97.9°F | Ht 66.0 in | Wt 233.0 lb

## 2016-12-17 DIAGNOSIS — B372 Candidiasis of skin and nail: Secondary | ICD-10-CM

## 2016-12-17 DIAGNOSIS — C3492 Malignant neoplasm of unspecified part of left bronchus or lung: Secondary | ICD-10-CM

## 2016-12-17 DIAGNOSIS — L03311 Cellulitis of abdominal wall: Secondary | ICD-10-CM

## 2016-12-17 DIAGNOSIS — I952 Hypotension due to drugs: Secondary | ICD-10-CM

## 2016-12-17 DIAGNOSIS — Z9981 Dependence on supplemental oxygen: Secondary | ICD-10-CM

## 2016-12-17 MED ORDER — CEPHALEXIN 500 MG PO CAPS: 500.0000 mg | ORAL_CAPSULE | Freq: Two times a day (BID) | ORAL | 0 refills | Status: DC

## 2016-12-17 MED FILL — CEPHALEXIN 500 MG CAPSULE: 500 | 7 days supply | Qty: 14 | Fill #0

## 2016-12-17 NOTE — Patient Instructions (Signed)
We are going to treat you for a likely yeast infection/ superficial bacterial infection of your abdominal skin I gave you an rx for keflex antibiotic please take this twice a day for one week  Please also ask the pharamcist for an OTC antifungal cream- ketoconazole, clotrimazole, miconazole, econazole Apply the cream 2-3x a day and otherwise work on keeping the area as dry as you can Please see me next week so we can check on it!

## 2016-12-18 ENCOUNTER — Encounter: Payer: Self-pay | Admitting: Family Medicine

## 2016-12-23 NOTE — Progress Notes (Signed)
[place] [facility] [address], [place], [place] [address] [phone]  Date:  12/24/2016   Name:  Nancy Moore   DOB:  October 05, 1965   MRN:  [id number]  PCP:  Darreld Mclean, MD    Chief Complaint: No chief complaint on file.   History of Present Illness:  Nancy Moore is a 51 y.o. very pleasant female patient who presents with the following:  History of lung cancer- I met her a week ago to establish care: She was dx with lung cancer this past September- prior to this she had been in good health She was dx originally due to cough-  This led to a CXR, a CT and her diagnosis  Her oncologist is Dr. Earlie Server and Dr. Melvyn Novas is her pulmonologist   She is on oral chemo- surgery is not an option for her at this time Her cancer is stage 4 No radiation is planned at this time She started using oxygen over the last couple of months - uses this 24/7, 2-3L  She had several pelvic operations in the past-  Myomectomy x2, hernia repair, partial hyst- most recent operation was about 10 years ago This old operative site seemed to start oozing with a foul odor maybe a week ago- she is not sure if the old incision is opening up It does not hurt any more than at baseline The area has felt hot No fever or chills however She is no longer having any vomiting  Her oncologist had mentioned decreasing her BP meds due to soft BP since she got sick   At her last visit acute concern was a possible infection of her abd wall. We treated her with keflex and adjusted her BP meds: Start treatment with a topical antifungal and also po abx.   Discussed strategies for keeping the area cool and dry Asked her to hold her losartan as BP is soft    BP Readings from Last 3 Encounters:  12/24/16 112/84  12/17/16 104/72  12/05/16 116/79   She is not taking her losartan She still feels a bit weak.    She notes that on Thanksgiving day she had  vomiting, diarrhea at home- she has some compazine at home. She called in to the on call service and they gave her advice.   She is now able to eat again but has to stick with cool foods as hot foods make her feel sick.  She last vomited on thanksgiving day. The diarrhea is also now better as well.  She feels somewhat weak still but realizes this may be from her baseline disease   Patient Active Problem List   Diagnosis Date Noted  . Encounter for antineoplastic chemotherapy 11/20/2016  . Adenocarcinoma of left lung, stage 4 ([place]) 10/28/2016  . Goals of care, counseling/discussion 10/28/2016  . Upper airway cough syndrome 10/16/2016  . Multiple pulmonary nodules 10/15/2016    Past Medical History:  Diagnosis Date  . Adenocarcinoma of left lung, stage 4 ([place]) 10/28/2016  . Goals of care, counseling/discussion 10/28/2016  . Hypertension     Past Surgical History:  Procedure Laterality Date  . BREAST EXCISIONAL BIOPSY Left 20+ yrs ago   benign  . VIDEO BRONCHOSCOPY Bilateral 10/21/2016   Procedure: VIDEO BRONCHOSCOPY WITH FLUORO;  Surgeon: Tanda Rockers, MD;  Location: [facility];  Service: Cardiopulmonary;  Laterality: Bilateral;    Social History   Tobacco Use  .  Smoking status: Never Smoker  . Smokeless tobacco: Never Used  Substance Use Topics  . Alcohol use: Yes    Comment: socially  . Drug use: No    Family History  Problem Relation Age of Onset  . Asthma Mother     Allergies  Allergen Reactions  . Percocet [Oxycodone-Acetaminophen] Other (See Comments)    "makes me dizzy"  . Lisinopril Cough    Medication list has been reviewed and updated.  Current Outpatient Medications on File Prior to Visit  Medication Sig Dispense Refill  . afatinib dimaleate (GILOTRIF) 40 MG tablet Take 1 tablet (40 mg total) by mouth daily. Take on an empty stomach 1hr before or 2 hrs after meals. 30 tablet 1  . cephALEXin (KEFLEX) 500 MG capsule Take 1 capsule (500 mg total) by  mouth 2 (two) times daily. 14 capsule 0  . clindamycin (CLINDAGEL) 1 % gel Apply topically 2 (two) times daily. 30 g 1  . Dextromethorphan-Guaifenesin (DELSYM COUGH/CHEST CONGEST DM) 5-100 MG/5ML LIQD Take 10 mLs by mouth 2 (two) times daily as needed (coughing).    . hydrochlorothiazide (HYDRODIURIL) 12.5 MG tablet Take 12.5 mg by mouth daily.    Marland Kitchen loperamide (IMODIUM) 1 MG/5ML solution Take 2 mg as needed by mouth for diarrhea or loose stools.    Marland Kitchen losartan (COZAAR) 25 MG tablet Take 25 mg by mouth daily.    . prochlorperazine (COMPAZINE) 10 MG tablet Take 1 tablet (10 mg total) every 6 (six) hours as needed by mouth for nausea or vomiting. 30 tablet 1  . traMADol (ULTRAM) 50 MG tablet TAKE 1 TO 2 TABLETS BY MOUTH EVERY 4 HOURS AS NEEDED FOR COUGH OR PAIN 28 tablet 0   No current facility-administered medications on file prior to visit.     Review of Systems:  As per HPI- otherwise negative.   Physical Examination: Vitals:   12/24/16 1130  BP: 112/84  Temp: 98.2 F (36.8 C)  SpO2: 97%   Vitals:   12/24/16 1130  Weight: 232 lb 3.2 oz (105.3 kg)  Height: '5\' 6"'  (1.676 m)   Body mass index is 37.48 kg/m. Ideal Body Weight: Weight in (lb) to have BMI = 25: 154.6  GEN: WDWN, NAD, Non-toxic, A & O x 3, overweight, appears chronically ill HEENT: Atraumatic, Normocephalic. Neck supple. No masses, No LAD. Ears and Nose: No external deformity. CV: RRR, No M/G/R. No JVD. No thrill. No extra heart sounds. PULM: CTA B, no wheezes, crackles, rhonchi. No retractions. No resp. distress. No accessory muscle use. ABD: S, NT, ND, +BS. No rebound. No HSM. EXTR: No c/c/e NEURO Normal gait.  PSYCH: Normally interactive. Conversant. Not depressed or anxious appearing.  Calm demeanor.  Wearing nasal canula oxygen Cellulitis at her bikini line incision is much better - odor and discharge are resolved and redness is much less.  Healing    Assessment and Plan Cellulitis of abdominal  wall  Adenocarcinoma of left lung, stage 4 (HCC)  Oxygen dependent  Hypotension due to drugs  Non-intractable vomiting with nausea, unspecified vomiting type - Plan: Basic metabolic panel  Here today to recheck on cellulitis of her abdominal wall.  This looks better!  She will continue to use antifungal cream as needed and will finish out her keflex Her BP is better today- continue to hold losartan Recent vomiting- now resolved.  Will check her lytes today Signed Lamar Blinks, MD  Received her labs Results for orders placed or performed in visit on 12/24/16  Basic metabolic panel  Result Value Ref Range   Sodium 132 (L) 135 - 145 mEq/L   Potassium 4.0 3.5 - 5.1 mEq/L   Chloride 94 (L) 96 - 112 mEq/L   CO2 26 19 - 32 mEq/L   Glucose, Bld 118 (H) 70 - 99 mg/dL   BUN 17 6 - 23 mg/dL   Creatinine, Ser 1.08 0.40 - 1.20 mg/dL   Calcium 9.7 8.4 - 10.5 mg/dL   GFR 68.95 >60.00 mL/min   Letter to pt

## 2016-12-24 ENCOUNTER — Ambulatory Visit (INDEPENDENT_AMBULATORY_CARE_PROVIDER_SITE_OTHER): Payer: 59 | Admitting: Family Medicine

## 2016-12-24 ENCOUNTER — Encounter: Payer: Self-pay | Admitting: Family Medicine

## 2016-12-24 VITALS — BP 112/84 | HR 101 | Temp 98.2°F | Ht 66.0 in | Wt 232.2 lb

## 2016-12-24 DIAGNOSIS — R112 Nausea with vomiting, unspecified: Secondary | ICD-10-CM | POA: Diagnosis not present

## 2016-12-24 DIAGNOSIS — I952 Hypotension due to drugs: Secondary | ICD-10-CM

## 2016-12-24 DIAGNOSIS — C3492 Malignant neoplasm of unspecified part of left bronchus or lung: Secondary | ICD-10-CM

## 2016-12-24 DIAGNOSIS — E871 Hypo-osmolality and hyponatremia: Secondary | ICD-10-CM

## 2016-12-24 DIAGNOSIS — L03311 Cellulitis of abdominal wall: Secondary | ICD-10-CM | POA: Diagnosis not present

## 2016-12-24 DIAGNOSIS — Z9981 Dependence on supplemental oxygen: Secondary | ICD-10-CM | POA: Diagnosis not present

## 2016-12-24 LAB — BASIC METABOLIC PANEL
BUN: 17 mg/dL (ref 6–23)
CO2: 26 mEq/L (ref 19–32)
Calcium: 9.7 mg/dL (ref 8.4–10.5)
Chloride: 94 mEq/L — ABNORMAL LOW (ref 96–112)
Creatinine, Ser: 1.08 mg/dL (ref 0.40–1.20)
GFR: 68.95 mL/min (ref >60.00)
Glucose, Bld: 118 mg/dL — ABNORMAL HIGH (ref 70–99)
Potassium: 4 mEq/L (ref 3.5–5.1)
Sodium: 132 mEq/L — ABNORMAL LOW (ref 135–145)

## 2016-12-24 NOTE — Patient Instructions (Addendum)
Your blood pressure looks better today Your wound looks great!  Much better.  Continue current treatment for another week or so- do try and keep it as dry as you can We will check a basic metabolic profile for you today- I'll be in touch with this asap

## 2016-12-25 ENCOUNTER — Telehealth: Payer: Self-pay | Admitting: Internal Medicine

## 2016-12-25 NOTE — Telephone Encounter (Signed)
Called pt and advised message from the provider. Pt understood and verbalized understanding. Nothing further is needed.    

## 2016-12-25 NOTE — Telephone Encounter (Signed)
Spoke with pt, she states 01/03/2017 will be the RTW date, and they need to get a medical report for the extension. Fax a note to the number below. They need to know why she cannot work. She stated she thought MW would keep her out at least until next year. Please advise MW.   [phone] Fax: [phone]

## 2016-12-25 NOTE — Telephone Encounter (Signed)
Sorry for the confusion but all further care is being directed  By oncology and should be helping update her work status - if they are either unable unwilling to do so I'd be happy to help out but since it's been > 2 month since last ov can't comment on work status s ov

## 2017-01-01 ENCOUNTER — Other Ambulatory Visit: Payer: Self-pay | Admitting: Medical Oncology

## 2017-01-01 DIAGNOSIS — C3492 Malignant neoplasm of unspecified part of left bronchus or lung: Secondary | ICD-10-CM

## 2017-01-01 MED ORDER — AFATINIB DIMALEATE 40 MG PO TABS: 40.0000 mg | ORAL_TABLET | Freq: Every day | ORAL | 1 refills | Status: DC

## 2017-01-08 ENCOUNTER — Encounter (HOSPITAL_COMMUNITY): Payer: Self-pay

## 2017-01-08 ENCOUNTER — Telehealth: Payer: Self-pay | Admitting: Family Medicine

## 2017-01-08 ENCOUNTER — Ambulatory Visit (HOSPITAL_COMMUNITY)
Admission: RE | Admit: 2017-01-08 | Discharge: 2017-01-08 | Disposition: A | Discharge status: Home / Self Care | Payer: 59 | Source: Ambulatory Visit | Attending: Oncology | Admitting: Oncology

## 2017-01-08 ENCOUNTER — Other Ambulatory Visit: Payer: Self-pay | Admitting: Oncology

## 2017-01-08 ENCOUNTER — Other Ambulatory Visit (HOSPITAL_BASED_OUTPATIENT_CLINIC_OR_DEPARTMENT_OTHER): Payer: 59

## 2017-01-08 DIAGNOSIS — I313 Pericardial effusion (noninflammatory): Secondary | ICD-10-CM

## 2017-01-08 DIAGNOSIS — C3492 Malignant neoplasm of unspecified part of left bronchus or lung: Secondary | ICD-10-CM

## 2017-01-08 DIAGNOSIS — I3139 Other pericardial effusion (noninflammatory): Secondary | ICD-10-CM

## 2017-01-08 DIAGNOSIS — J90 Pleural effusion, not elsewhere classified: Secondary | ICD-10-CM | POA: Insufficient documentation

## 2017-01-08 LAB — CBC WITH DIFFERENTIAL/PLATELET
BASO%: 0.8 % (ref 0.0–2.0)
Basophils Absolute: 0.1 10*3/uL (ref 0.0–0.1)
EOS%: 4.6 % (ref 0.0–7.0)
Eosinophils Absolute: 0.5 10*3/uL (ref 0.0–0.5)
HCT: 31.8 % — ABNORMAL LOW (ref 34.8–46.6)
HGB: 10.2 g/dL — ABNORMAL LOW (ref 11.6–15.9)
LYMPH%: 11 % — ABNORMAL LOW (ref 14.0–49.7)
MCH: 26.9 pg (ref 25.1–34.0)
MCHC: 32.1 g/dL (ref 31.5–36.0)
MCV: 83.8 fL (ref 79.5–101.0)
MONO#: 1.3 10*3/uL — ABNORMAL HIGH (ref 0.1–0.9)
MONO%: 12.4 % (ref 0.0–14.0)
NEUT#: 7.6 10*3/uL — ABNORMAL HIGH (ref 1.5–6.5)
NEUT%: 71.2 % (ref 38.4–76.8)
Platelets: 268 10*3/uL (ref 145–400)
RBC: 3.8 10*6/uL (ref 3.70–5.45)
RDW: 17.1 % — ABNORMAL HIGH (ref 11.2–14.5)
WBC: 10.7 10*3/uL — ABNORMAL HIGH (ref 3.9–10.3)
lymph#: 1.2 10*3/uL (ref 0.9–3.3)

## 2017-01-08 LAB — COMPREHENSIVE METABOLIC PANEL
ALT: 34 U/L (ref 0–55)
AST: 31 U/L (ref 5–34)
Albumin: 3.6 g/dL (ref 3.5–5.0)
Alkaline Phosphatase: 82 U/L (ref 40–150)
Anion Gap: 14 mEq/L — ABNORMAL HIGH (ref 3–11)
BUN: 14.6 mg/dL (ref 7.0–26.0)
CO2: 25 mEq/L (ref 22–29)
Calcium: 9.5 mg/dL (ref 8.4–10.4)
Chloride: 97 mEq/L — ABNORMAL LOW (ref 98–109)
Creatinine: 1.3 mg/dL — ABNORMAL HIGH (ref 0.6–1.1)
EGFR: 56 mL/min/{1.73_m2} — ABNORMAL LOW (ref >60)
Glucose: 109 mg/dl (ref 70–140)
Potassium: 3.2 mEq/L — ABNORMAL LOW (ref 3.5–5.1)
Sodium: 135 mEq/L — ABNORMAL LOW (ref 136–145)
Total Bilirubin: 1.74 mg/dL — ABNORMAL HIGH (ref 0.20–1.20)
Total Protein: 7.1 g/dL (ref 6.4–8.3)

## 2017-01-08 MED ORDER — IOPAMIDOL (ISOVUE-300) INJECTION 61%
100.0000 mL | Freq: Once | INTRAVENOUS | Status: AC | PRN
  Administered 2017-01-08: 100 mL via INTRAVENOUS

## 2017-01-08 MED ORDER — IOPAMIDOL (ISOVUE-300) INJECTION 61%
INTRAVENOUS | Status: AC
  Filled 2017-01-08: qty 100

## 2017-01-08 NOTE — Telephone Encounter (Signed)
Called her back- she needs note faxed to her work at [employer] She just got a very bad CT report Will be glad to take care of this note for her

## 2017-01-08 NOTE — Telephone Encounter (Signed)
Relation to PR:KSYS Call back number: [phone]   Reason for call:   Patient requesting letter from Dr. Lorelei Pont reflecting "unspecified date" extending her leave from work.   Please reference Dr. Melvyn Novas 12/25/16 telephone note requesting patient to follow up with oncologist regarding the extension. Patient stated she hasn't seen oncologist but has seen PCP recently. Patient would like to discuss please advise

## 2017-01-08 NOTE — Telephone Encounter (Signed)
Copied from [place]. Topic: Quick Communication - See Telephone Encounter >> Jan 08, 2017  4:38 PM Cleaster Corin, NT wrote: CRM for notification. See Telephone encounter for:   01/08/17. [id number]

## 2017-01-08 NOTE — Telephone Encounter (Signed)
Copied from [place] [phone]. Topic: General - Other >> Jan 01, 2017 11:30 AM Burnis Medin, NT wrote: Reason for CRM: Pt called in and what a call back from Dr. Lorelei Pont regarding paper work

## 2017-01-08 NOTE — Progress Notes (Unsigned)
Orders Placed This Encounter  Procedures  . US THORACENTESIS ASP PLEURAL SPACE W/IMG GUIDE    Standing Status:   Future    Standing Expiration Date:   03/10/2018    Order Specific Question:   Reason for exam:    Answer:   Large right pleural effusion. Remove fluid and send for cytology.    Order Specific Question:   Preferred imaging location?    Answer:   [facility]  . Ambulatory referral to Cardiology    Referral Priority:   Urgent    Referral Type:   Consultation    Referral Reason:   Specialty Services Required    Requested Specialty:   Cardiology    Number of Visits Requested:   1

## 2017-01-09 ENCOUNTER — Encounter (HOSPITAL_COMMUNITY): Payer: Self-pay

## 2017-01-09 ENCOUNTER — Telehealth: Payer: Self-pay | Admitting: Oncology

## 2017-01-09 ENCOUNTER — Emergency Department (HOSPITAL_COMMUNITY): Payer: 59

## 2017-01-09 ENCOUNTER — Ambulatory Visit (HOSPITAL_BASED_OUTPATIENT_CLINIC_OR_DEPARTMENT_OTHER): Payer: 59

## 2017-01-09 ENCOUNTER — Telehealth: Payer: Self-pay | Admitting: Family Medicine

## 2017-01-09 ENCOUNTER — Ambulatory Visit: Payer: 59 | Admitting: Internal Medicine

## 2017-01-09 ENCOUNTER — Other Ambulatory Visit: Payer: Self-pay

## 2017-01-09 ENCOUNTER — Encounter (HOSPITAL_COMMUNITY): Payer: Self-pay | Admitting: Radiology

## 2017-01-09 ENCOUNTER — Encounter: Payer: Self-pay | Admitting: Internal Medicine

## 2017-01-09 ENCOUNTER — Inpatient Hospital Stay (HOSPITAL_COMMUNITY)
Admission: EM | Admit: 2017-01-09 | Discharge: 2017-01-22 | DRG: 270 | Disposition: A | Discharge status: Skilled Nursing Facility | Payer: 59 | Attending: Internal Medicine | Admitting: Internal Medicine

## 2017-01-09 VITALS — BP 107/75 | HR 92 | Ht 66.0 in | Wt 226.0 lb

## 2017-01-09 DIAGNOSIS — I3139 Other pericardial effusion (noninflammatory): Secondary | ICD-10-CM | POA: Diagnosis present

## 2017-01-09 DIAGNOSIS — C3490 Malignant neoplasm of unspecified part of unspecified bronchus or lung: Secondary | ICD-10-CM | POA: Insufficient documentation

## 2017-01-09 DIAGNOSIS — R0602 Shortness of breath: Secondary | ICD-10-CM

## 2017-01-09 DIAGNOSIS — R601 Generalized edema: Secondary | ICD-10-CM | POA: Diagnosis not present

## 2017-01-09 DIAGNOSIS — J910 Malignant pleural effusion: Secondary | ICD-10-CM | POA: Diagnosis not present

## 2017-01-09 DIAGNOSIS — I110 Hypertensive heart disease with heart failure: Secondary | ICD-10-CM | POA: Diagnosis present

## 2017-01-09 DIAGNOSIS — C3492 Malignant neoplasm of unspecified part of left bronchus or lung: Secondary | ICD-10-CM | POA: Diagnosis not present

## 2017-01-09 DIAGNOSIS — R52 Pain, unspecified: Secondary | ICD-10-CM

## 2017-01-09 DIAGNOSIS — Z515 Encounter for palliative care: Secondary | ICD-10-CM | POA: Diagnosis not present

## 2017-01-09 DIAGNOSIS — I313 Pericardial effusion (noninflammatory): Secondary | ICD-10-CM

## 2017-01-09 DIAGNOSIS — T501X5A Adverse effect of loop [high-ceiling] diuretics, initial encounter: Secondary | ICD-10-CM | POA: Diagnosis present

## 2017-01-09 DIAGNOSIS — E8809 Other disorders of plasma-protein metabolism, not elsewhere classified: Secondary | ICD-10-CM | POA: Diagnosis present

## 2017-01-09 DIAGNOSIS — I5021 Acute systolic (congestive) heart failure: Secondary | ICD-10-CM | POA: Diagnosis not present

## 2017-01-09 DIAGNOSIS — I5023 Acute on chronic systolic (congestive) heart failure: Secondary | ICD-10-CM | POA: Diagnosis present

## 2017-01-09 DIAGNOSIS — N390 Urinary tract infection, site not specified: Secondary | ICD-10-CM | POA: Diagnosis not present

## 2017-01-09 DIAGNOSIS — E860 Dehydration: Secondary | ICD-10-CM | POA: Diagnosis present

## 2017-01-09 DIAGNOSIS — I318 Other specified diseases of pericardium: Secondary | ICD-10-CM

## 2017-01-09 DIAGNOSIS — E876 Hypokalemia: Secondary | ICD-10-CM | POA: Diagnosis not present

## 2017-01-09 DIAGNOSIS — I3131 Malignant pericardial effusion in diseases classified elsewhere: Secondary | ICD-10-CM

## 2017-01-09 DIAGNOSIS — T502X5A Adverse effect of carbonic-anhydrase inhibitors, benzothiadiazides and other diuretics, initial encounter: Secondary | ICD-10-CM | POA: Diagnosis present

## 2017-01-09 DIAGNOSIS — Z6837 Body mass index (BMI) 37.0-37.9, adult: Secondary | ICD-10-CM | POA: Diagnosis not present

## 2017-01-09 DIAGNOSIS — Z9689 Presence of other specified functional implants: Secondary | ICD-10-CM

## 2017-01-09 DIAGNOSIS — I10 Essential (primary) hypertension: Secondary | ICD-10-CM | POA: Diagnosis not present

## 2017-01-09 DIAGNOSIS — Z79899 Other long term (current) drug therapy: Secondary | ICD-10-CM

## 2017-01-09 DIAGNOSIS — K5090 Crohn's disease, unspecified, without complications: Secondary | ICD-10-CM | POA: Diagnosis present

## 2017-01-09 DIAGNOSIS — J90 Pleural effusion, not elsewhere classified: Secondary | ICD-10-CM

## 2017-01-09 DIAGNOSIS — Z7189 Other specified counseling: Secondary | ICD-10-CM | POA: Diagnosis not present

## 2017-01-09 DIAGNOSIS — J939 Pneumothorax, unspecified: Secondary | ICD-10-CM | POA: Diagnosis not present

## 2017-01-09 DIAGNOSIS — E669 Obesity, unspecified: Secondary | ICD-10-CM | POA: Diagnosis present

## 2017-01-09 DIAGNOSIS — D72829 Elevated white blood cell count, unspecified: Secondary | ICD-10-CM | POA: Diagnosis not present

## 2017-01-09 DIAGNOSIS — R0600 Dyspnea, unspecified: Secondary | ICD-10-CM | POA: Diagnosis not present

## 2017-01-09 DIAGNOSIS — D62 Acute posthemorrhagic anemia: Secondary | ICD-10-CM

## 2017-01-09 DIAGNOSIS — C801 Malignant (primary) neoplasm, unspecified: Secondary | ICD-10-CM

## 2017-01-09 DIAGNOSIS — B961 Klebsiella pneumoniae [K. pneumoniae] as the cause of diseases classified elsewhere: Secondary | ICD-10-CM | POA: Diagnosis present

## 2017-01-09 DIAGNOSIS — R079 Chest pain, unspecified: Secondary | ICD-10-CM | POA: Diagnosis not present

## 2017-01-09 DIAGNOSIS — I314 Cardiac tamponade: Secondary | ICD-10-CM

## 2017-01-09 DIAGNOSIS — N179 Acute kidney failure, unspecified: Secondary | ICD-10-CM | POA: Diagnosis present

## 2017-01-09 DIAGNOSIS — I959 Hypotension, unspecified: Secondary | ICD-10-CM | POA: Diagnosis not present

## 2017-01-09 LAB — ECHOCARDIOGRAM COMPLETE
Height: 66 in
Weight: 3616 oz

## 2017-01-09 LAB — BASIC METABOLIC PANEL
Anion gap: 14 (ref 5–15)
BUN: 12 mg/dL (ref 6–20)
CO2: 23 mmol/L (ref 22–32)
Calcium: 9.6 mg/dL (ref 8.9–10.3)
Chloride: 96 mmol/L — ABNORMAL LOW (ref 101–111)
Creatinine, Ser: 1.23 mg/dL — ABNORMAL HIGH (ref 0.44–1.00)
GFR calc Af Amer: 58 mL/min — ABNORMAL LOW (ref >60)
GFR calc non Af Amer: 50 mL/min — ABNORMAL LOW (ref >60)
Glucose, Bld: 103 mg/dL — ABNORMAL HIGH (ref 65–99)
Potassium: 3.1 mmol/L — ABNORMAL LOW (ref 3.5–5.1)
Sodium: 133 mmol/L — ABNORMAL LOW (ref 135–145)

## 2017-01-09 LAB — PROTIME-INR
INR: 1.49
Prothrombin Time: 17.9 seconds — ABNORMAL HIGH (ref 11.4–15.2)

## 2017-01-09 LAB — I-STAT BETA HCG BLOOD, ED (MC, WL, AP ONLY): I-stat hCG, quantitative: <5 m[IU]/mL (ref <5)

## 2017-01-09 LAB — CBC
HCT: 34.2 % — ABNORMAL LOW (ref 36.0–46.0)
Hemoglobin: 10.9 g/dL — ABNORMAL LOW (ref 12.0–15.0)
MCH: 26.7 pg (ref 26.0–34.0)
MCHC: 31.9 g/dL (ref 30.0–36.0)
MCV: 83.8 fL (ref 78.0–100.0)
Platelets: 287 10*3/uL (ref 150–400)
RBC: 4.08 MIL/uL (ref 3.87–5.11)
RDW: 16.6 % — ABNORMAL HIGH (ref 11.5–15.5)
WBC: 12.6 10*3/uL — ABNORMAL HIGH (ref 4.0–10.5)

## 2017-01-09 LAB — MAGNESIUM: Magnesium: 2.1 mg/dL (ref 1.7–2.4)

## 2017-01-09 LAB — I-STAT TROPONIN, ED: Troponin i, poc: 0.02 ng/mL (ref 0.00–0.08)

## 2017-01-09 LAB — APTT: aPTT: 32 seconds (ref 24–36)

## 2017-01-09 LAB — LACTIC ACID, PLASMA: Lactic Acid, Venous: 1.5 mmol/L (ref 0.5–1.9)

## 2017-01-09 MED ORDER — ALBUTEROL SULFATE (2.5 MG/3ML) 0.083% IN NEBU: 2.5000 mg | INHALATION_SOLUTION | Freq: Four times a day (QID) | RESPIRATORY_TRACT | Status: DC | PRN

## 2017-01-09 MED ORDER — ACETAMINOPHEN 325 MG PO TABS: 650.0000 mg | ORAL_TABLET | Freq: Four times a day (QID) | ORAL | Status: DC | PRN

## 2017-01-09 MED ORDER — FUROSEMIDE 10 MG/ML IJ SOLN
40.0000 mg | Freq: Once | INTRAMUSCULAR | Status: AC
  Administered 2017-01-10: 40 mg via INTRAVENOUS
  Filled 2017-01-09: qty 4

## 2017-01-09 MED ORDER — AFATINIB DIMALEATE 40 MG PO TABS: 40.0000 mg | ORAL_TABLET | Freq: Every day | ORAL | Status: DC

## 2017-01-09 MED ORDER — DM-GUAIFENESIN ER 30-600 MG PO TB12: 1.0000 | ORAL_TABLET | Freq: Two times a day (BID) | ORAL | Status: DC | PRN

## 2017-01-09 MED ORDER — FUROSEMIDE 40 MG PO TABS
40.0000 mg | ORAL_TABLET | Freq: Every day | ORAL | Status: DC
  Administered 2017-01-10: 40 mg via ORAL
  Filled 2017-01-09 (×2): qty 1

## 2017-01-09 MED ORDER — HYDRALAZINE HCL 20 MG/ML IJ SOLN: 5.0000 mg | INTRAMUSCULAR | Status: DC | PRN

## 2017-01-09 MED ORDER — FUROSEMIDE 40 MG PO TABS: 40.0000 mg | ORAL_TABLET | Freq: Every day | ORAL | 3 refills | Status: DC

## 2017-01-09 MED ORDER — POTASSIUM CHLORIDE CRYS ER 20 MEQ PO TBCR
60.0000 meq | EXTENDED_RELEASE_TABLET | Freq: Once | ORAL | Status: AC
  Administered 2017-01-10: 60 meq via ORAL
  Filled 2017-01-09: qty 3

## 2017-01-09 MED ORDER — PROCHLORPERAZINE MALEATE 10 MG PO TABS
10.0000 mg | ORAL_TABLET | Freq: Four times a day (QID) | ORAL | Status: DC | PRN
  Filled 2017-01-09: qty 1

## 2017-01-09 MED ORDER — LOPERAMIDE HCL 1 MG/5ML PO LIQD: 2.0000 mg | ORAL | Status: DC | PRN

## 2017-01-09 MED ORDER — ZOLPIDEM TARTRATE 5 MG PO TABS
5.0000 mg | ORAL_TABLET | Freq: Every evening | ORAL | Status: DC | PRN
  Filled 2017-01-09: qty 1

## 2017-01-09 MED ORDER — CLINDAMYCIN PHOSPHATE 1 % EX SOLN
Freq: Two times a day (BID) | CUTANEOUS | Status: DC
  Filled 2017-01-09: qty 30

## 2017-01-09 MED FILL — FUROSEMIDE 40 MG TABS: 40 | 30 days supply | Qty: 30 | Fill #0

## 2017-01-09 NOTE — ED Provider Notes (Signed)
[facility] EMERGENCY DEPARTMENT Provider Note   CSN: [id number] Arrival date & time: 01/09/17  1353     History   Chief Complaint Chief Complaint  Patient presents with  . Shortness of Breath    HPI Nancy Moore is a 51 y.o. female.  HPI   54yF with stage IV adenocarcinoma of the lung with just diagnosed large complex pericardial effusion.  Identified on CT yesterday and had subsequent ultrasound this morning.  Nancy Moore was referred to the emergency room after cardiology evaluation today.  Past Medical History:  Diagnosis Date  . Adenocarcinoma of left lung, stage 4 ([place]) 10/28/2016  . Goals of care, counseling/discussion 10/28/2016  . Hypertension     Patient Active Problem List   Diagnosis Date Noted  . Hypotension 01/09/2017  . Essential hypertension 01/09/2017  . Pleural effusion on right 01/08/2017  . Pericardial effusion 01/08/2017  . Encounter for antineoplastic chemotherapy 11/20/2016  . Adenocarcinoma of left lung, stage 4 ([place]) 10/28/2016  . Goals of care, counseling/discussion 10/28/2016  . Upper airway cough syndrome 10/16/2016  . Multiple pulmonary nodules 10/15/2016    Past Surgical History:  Procedure Laterality Date  . BREAST EXCISIONAL BIOPSY Left 20+ yrs ago   benign  . VIDEO BRONCHOSCOPY Bilateral 10/21/2016   Procedure: VIDEO BRONCHOSCOPY WITH FLUORO;  Surgeon: Tanda Rockers, MD;  Location: [facility];  Service: Cardiopulmonary;  Laterality: Bilateral;    OB History    No data available       Home Medications    Prior to Admission medications   Medication Sig Start Date End Date Taking? Authorizing Provider  afatinib dimaleate (GILOTRIF) 40 MG tablet Take 1 tablet (40 mg total) by mouth daily. Take on an empty stomach 1hr before or 2 hrs after meals. 01/01/17  Yes Curt Bears, MD  clindamycin (CLINDAGEL) 1 % gel Apply topically 2 (two) times daily. 11/20/16  Yes Curcio, Roselie Awkward, NP  loperamide (IMODIUM) 1 MG/5ML  solution Take 2 mg as needed by mouth for diarrhea or loose stools.   Yes [provider]  prochlorperazine (COMPAZINE) 10 MG tablet Take 1 tablet (10 mg total) every 6 (six) hours as needed by mouth for nausea or vomiting. 12/05/16  Yes Curcio, Roselie Awkward, NP  furosemide (LASIX) 40 MG tablet Take 1 tablet (40 mg total) by mouth daily. 01/09/17 04/09/17  Pixie Casino, MD    Family History Family History  Problem Relation Age of Onset  . Asthma Mother   . Stroke Mother   . Hypertension Mother   . Heart attack Father   . Hypertension Father   . Hyperlipidemia Father   . Dementia Father   . Emphysema Maternal Grandmother   . Hypertension Maternal Grandmother   . Colon cancer Paternal Grandmother     Social History Social History   Tobacco Use  . Smoking status: Never Smoker  . Smokeless tobacco: Never Used  Substance Use Topics  . Alcohol use: Yes    Comment: socially  . Drug use: No     Allergies   Percocet [oxycodone-acetaminophen]; Lisinopril; Losartan; and Valsartan   Review of Systems Review of Systems  All systems reviewed and negative, other than as noted in HPI.  Physical Exam Updated Vital Signs BP (!) 115/93   Pulse 87   Temp 97.9 F (36.6 C) (Oral)   Resp (!) 22   Ht 5' 6"  (1.676 m)   Wt 102.5 kg (226 lb)   LMP 03/15/2010   SpO2  96%   BMI 36.48 kg/m   Physical Exam  Constitutional: Nancy Moore appears well-developed and well-nourished. No distress.  Laying in bed.  Appears tired, but not toxic.  HENT:  Head: Normocephalic and atraumatic.  Eyes: Conjunctivae are normal. Right eye exhibits no discharge. Left eye exhibits no discharge.  Neck: Neck supple.  Cardiovascular: Normal rate, regular rhythm and normal heart sounds. Exam reveals no gallop and no friction rub.  No murmur heard. Pulmonary/Chest: Effort normal. No respiratory distress.  Decreased breath sounds bilateral bases.  Abdominal: Soft. Nancy Moore exhibits no distension. There is no  tenderness.  Musculoskeletal: Nancy Moore exhibits no edema or tenderness.  Neurological: Nancy Moore is alert.  Skin: Skin is warm and dry.  Psychiatric: Nancy Moore has a normal mood and affect. Her behavior is normal. Thought content normal.  Nursing note and vitals reviewed.    ED Treatments / Results  Labs (all labs ordered are listed, but only abnormal results are displayed) Labs Reviewed  BASIC METABOLIC PANEL - Abnormal; Notable for the following components:      Result Value   Sodium 133 (*)    Potassium 3.1 (*)    Chloride 96 (*)    Glucose, Bld 103 (*)    Creatinine, Ser 1.23 (*)    GFR calc non Af Amer 50 (*)    GFR calc Af Amer 58 (*)    All other components within normal limits  CBC - Abnormal; Notable for the following components:   WBC 12.6 (*)    Hemoglobin 10.9 (*)    HCT 34.2 (*)    RDW 16.6 (*)    All other components within normal limits  I-STAT TROPONIN, ED  I-STAT BETA HCG BLOOD, ED ([facility], [facility], [facility] ONLY)    EKG  EKG Interpretation None       Radiology Dg Chest 2 View  Result Date: 01/09/2017 CLINICAL DATA:  Shortness of Breath EXAM: CHEST  2 VIEW COMPARISON:  Chest CT January 08, 2017. Chest radiograph October 21, 2016 FINDINGS: There are fairly small pleural effusions bilaterally. There is patchy airspace opacity in the left upper lobe as well as in both lower lobes. There is mild upper lobe interstitial edema. There is cardiomegaly with suspected pericardial effusion. Note that there is splaying of the carina. No adenopathy. No bone lesions appreciable. IMPRESSION: Cardiomegaly with suspected pericardial effusion. Pleural effusions bilaterally with areas of patchy airspace opacity, consistent with either pneumonia or alveolar edema. Both entities may be present concurrently. There is mild upper lobe interstitial edema. Electronically Signed   By: Lowella Grip III M.D.   On: 01/09/2017 16:34   Ct Chest W Contrast  Result Date: 01/08/2017 CLINICAL DATA:  Stage IV  lung cancer. EXAM: CT CHEST, ABDOMEN, AND PELVIS WITH CONTRAST TECHNIQUE: Multidetector CT imaging of the chest, abdomen and pelvis was performed following the standard protocol during bolus administration of intravenous contrast. CONTRAST:  186m ISOVUE-300 IOPAMIDOL (ISOVUE-300) INJECTION 61% COMPARISON:  PET-CT 10/31/2016 and chest CT 10/10/2016 FINDINGS: CT CHEST FINDINGS Cardiovascular: Very large pericardial effusion which measures 36 Hounsfield units suggesting complex fluid possibly due to pericardial metastatic disease. The heart is normal in size. No aortic dissection or aneurysm. The branch vessels are patent. Mediastinum/Nodes: No obvious mediastinal or hilar lymphadenopathy. The esophagus is grossly normal. Lungs/Pleura: Large right pleural effusion and small to moderate size left pleural effusion with overlying atelectasis. There is also persistent diffuse nodular interstitial thickening likely reflecting interstitial spread of tumor. Musculoskeletal: Possible sclerotic metastatic lesion involving the left aspect  of T4. No other definite bone lesions. A few tiny sclerotic lesions are possibly bone islands. No breast masses, supraclavicular or axillary lymphadenopathy. Small scattered lymph nodes are noted. CT ABDOMEN PELVIS FINDINGS Hepatobiliary: No focal hepatic lesions to suggest metastatic disease. The gallbladder contains some high attenuation tear which could be sludge or stones. No common bile duct dilatation. Pancreas: No mass, inflammation or ductal dilatation. Spleen: Normal size.  No focal lesions. Adrenals/Urinary Tract: The adrenal glands and kidneys are unremarkable. The bladder is unremarkable. Stomach/Bowel: The stomach, duodenum, small bowel and colon are grossly normal without oral contrast. No inflammatory changes, mass lesions or obstructive findings. The terminal ileum and appendix are normal. Vascular/Lymphatic: Moderate distal aortic and proximal iliac artery calcifications but  no aneurysm or dissection. The branch vessels are patent. The major venous structures are patent. Small scattered mesenteric and retroperitoneal lymph nodes but no mass or overt adenopathy. Reproductive: Surgically absent. Other: Small amount free pelvic fluid. No pelvic mass or adenopathy. No inguinal mass or adenopathy. Musculoskeletal: No definite findings for osseous metastatic disease. IMPRESSION: 1. Very large pericardial effusion, significantly increased since the PET-CT of October. 2. Large right and small to moderate left pleural effusions. 3. Nodular interstitial thickening in the lungs most consistent with interstitial spread of tumor and consistent with prior PET-CT findings. 4. No old abdominal/pelvic metastatic disease is identified. 5. Sclerotic metastatic focus involving the T4 vertebral body. This was also hot on the prior PET scan. These results will be called to the ordering clinician or representative by the Radiologist Assistant, and communication documented in the PACS or zVision Dashboard. Electronically Signed   By: Marijo Sanes M.D.   On: 01/08/2017 12:49   Ct Abdomen Pelvis W Contrast  Result Date: 01/08/2017 CLINICAL DATA:  Stage IV lung cancer. EXAM: CT CHEST, ABDOMEN, AND PELVIS WITH CONTRAST TECHNIQUE: Multidetector CT imaging of the chest, abdomen and pelvis was performed following the standard protocol during bolus administration of intravenous contrast. CONTRAST:  154m ISOVUE-300 IOPAMIDOL (ISOVUE-300) INJECTION 61% COMPARISON:  PET-CT 10/31/2016 and chest CT 10/10/2016 FINDINGS: CT CHEST FINDINGS Cardiovascular: Very large pericardial effusion which measures 36 Hounsfield units suggesting complex fluid possibly due to pericardial metastatic disease. The heart is normal in size. No aortic dissection or aneurysm. The branch vessels are patent. Mediastinum/Nodes: No obvious mediastinal or hilar lymphadenopathy. The esophagus is grossly normal. Lungs/Pleura: Large right pleural  effusion and small to moderate size left pleural effusion with overlying atelectasis. There is also persistent diffuse nodular interstitial thickening likely reflecting interstitial spread of tumor. Musculoskeletal: Possible sclerotic metastatic lesion involving the left aspect of T4. No other definite bone lesions. A few tiny sclerotic lesions are possibly bone islands. No breast masses, supraclavicular or axillary lymphadenopathy. Small scattered lymph nodes are noted. CT ABDOMEN PELVIS FINDINGS Hepatobiliary: No focal hepatic lesions to suggest metastatic disease. The gallbladder contains some high attenuation tear which could be sludge or stones. No common bile duct dilatation. Pancreas: No mass, inflammation or ductal dilatation. Spleen: Normal size.  No focal lesions. Adrenals/Urinary Tract: The adrenal glands and kidneys are unremarkable. The bladder is unremarkable. Stomach/Bowel: The stomach, duodenum, small bowel and colon are grossly normal without oral contrast. No inflammatory changes, mass lesions or obstructive findings. The terminal ileum and appendix are normal. Vascular/Lymphatic: Moderate distal aortic and proximal iliac artery calcifications but no aneurysm or dissection. The branch vessels are patent. The major venous structures are patent. Small scattered mesenteric and retroperitoneal lymph nodes but no mass or overt adenopathy. Reproductive: Surgically  absent. Other: Small amount free pelvic fluid. No pelvic mass or adenopathy. No inguinal mass or adenopathy. Musculoskeletal: No definite findings for osseous metastatic disease. IMPRESSION: 1. Very large pericardial effusion, significantly increased since the PET-CT of October. 2. Large right and small to moderate left pleural effusions. 3. Nodular interstitial thickening in the lungs most consistent with interstitial spread of tumor and consistent with prior PET-CT findings. 4. No old abdominal/pelvic metastatic disease is identified. 5.  Sclerotic metastatic focus involving the T4 vertebral body. This was also hot on the prior PET scan. These results will be called to the ordering clinician or representative by the Radiologist Assistant, and communication documented in the PACS or zVision Dashboard. Electronically Signed   By: Marijo Sanes M.D.   On: 01/08/2017 12:49    Procedures Procedures (including critical care time)  Medications Ordered in ED Medications - No data to display   Initial Impression / Assessment and Plan / ED Course  I have reviewed the triage vital signs and the nursing notes.  Pertinent labs & imaging results that were available during my care of the patient were reviewed by me and considered in my medical decision making (see chart for details).     Large pericardial effusion. Likely malignant. Concern for tamponade physiology on ECHO. Currently HD stable. Admit for definitive management.   Final Clinical Impressions(s) / ED Diagnoses   Final diagnoses:  Pericardial effusion  Pneumothorax  Chest tube in place  Pleural effusion  Adenocarcinoma of left lung, stage 4 (HCC)  Pleural effusion on right  SOB (shortness of breath)    ED Discharge Orders    None       Virgel Manifold, MD 01/23/17 1524

## 2017-01-09 NOTE — Telephone Encounter (Signed)
Received restaging CT scan results.  Reviewed with Dr. Earlie Server.  The patient has an enlarging pericardial effusion as well as a large right pleural effusion.  Independent review of the images does show some improvement in her lung cancer within the lung parenchyma.  Call placed to the patient and advised her that she will need to be evaluated by cardiology for the pericardial effusion and that I have also placed an order for an ultrasound-guided thoracentesis in interventional radiology.  I have contacted [facility] and the patient will have an appointment with them on 01/09/2017 at 8:45 in the morning.  I advised her to arrive by 830.  I also advised her to expect a call from interventional radiology to schedule thoracentesis.  She will keep her follow-up with [place] next week as scheduled for more detailed discussion of her CT scan results and evaluation of toxicities to her current treatment.

## 2017-01-09 NOTE — Patient Instructions (Addendum)
Your physician has recommended you make the following change in your medication: START lasix 40mg  once daily  Your physician has requested that you have an echocardiogram @ [address] - [address]rd [address]. Echocardiography is a painless test that uses sound waves to create images of your heart. It provides your doctor with information about the size and shape of your heart and how well your heart's chambers and valves are working. This procedure takes approximately one hour. There are no restrictions for this procedure.  Your physician recommends that you schedule a follow-up appointment in a few weeks with Dr. Debara Pickett (end of December)

## 2017-01-09 NOTE — H&P (Addendum)
History and Physical    Nancy Moore ZTI:[id number] DOB: 08/14/1965 DOA: 01/09/2017  Referring MD/NP/PA:   PCP: Darreld Mclean, MD   Patient coming from:  The patient is coming from home.  At baseline, pt is independent for most of ADL.  Chief Complaint: SOB  HPI: Nancy Moore is a 51 y.o. female with medical history significant of stage IV lung adenocarcinoma, hypertension, who presents with SOB.   Pt states that she has worsening shortness of breath in the past for several days, which has been progressively getting worse. She has mild cough, but no chest pain. She has chills, no fever.  She recently had a re-staging CT scan of her chest which showed a large, complex pericardial effusion as well as a large right pleural effusion. An urgent referral to cardiology was made for today. She was seen Dr. Debara Pickett in office. She had a state 2D Echo, which showed massive pericardial effusion with signs of early tamponade. She is sent to ED further evaluation and treatment. Per Dr. Lysbeth Penner note, "she will need a CT surgery consultation to drain her pericardial and pleural effusions which are likely malignant". Patient states that she has mild intermittent nausea and vomiting and diarrhea. Currently no nausea, vomiting or diarrhea. No abdominal pain. Denies symptoms of UTI or unilateral weakness. Patient has bilateral leg edema.   ED Course: pt was found to have  WBC 12.6, negative troponin, negative pregnancy test, potassium 3.1, acute renal injury with creatinine 1.3, BUN 12, heart rate in 90s, respiration rate 22, oxygen saturation 96% on room air, no fever. Patient is admitted to stepdown bed as inpatient. Thoracic surgeon, Dr. Prescott Gum  was consulted.   Review of Systems:   General: no fevers, has chills, has poor appetite, has fatigue HEENT: no blurry vision, hearing changes or sore throat Respiratory: has dyspnea, coughing, no wheezing CV: no chest pain, no palpitations GI: no nausea,  vomiting, abdominal pain, diarrhea, constipation GU: no dysuria, burning on urination, increased urinary frequency, hematuria  Ext: has leg edema Neuro: no unilateral weakness, numbness, or tingling, no vision change or hearing loss Skin: no rash, no skin tear. MSK: No muscle spasm, no deformity, no limitation of range of movement in spin Heme: No easy bruising.  Travel history: No recent long distant travel.  Allergy:  Allergies  Allergen Reactions  . Percocet [Oxycodone-Acetaminophen] Other (See Comments)    "makes me dizzy"  . Lisinopril Cough  . Losartan   . Valsartan Cough    Past Medical History:  Diagnosis Date  . Adenocarcinoma of left lung, stage 4 ([place]) 10/28/2016  . Goals of care, counseling/discussion 10/28/2016  . Hypertension     Past Surgical History:  Procedure Laterality Date  . BREAST EXCISIONAL BIOPSY Left 20+ yrs ago   benign  . VIDEO BRONCHOSCOPY Bilateral 10/21/2016   Procedure: VIDEO BRONCHOSCOPY WITH FLUORO;  Surgeon: Tanda Rockers, MD;  Location: [facility];  Service: Cardiopulmonary;  Laterality: Bilateral;    Social History:  reports that  has never smoked. she has never used smokeless tobacco. She reports that she drinks alcohol. She reports that she does not use drugs.  Family History:  Family History  Problem Relation Age of Onset  . Asthma Mother   . Stroke Mother   . Hypertension Mother   . Heart attack Father   . Hypertension Father   . Hyperlipidemia Father   . Dementia Father   . Emphysema Maternal Grandmother   . Hypertension Maternal  Grandmother   . Colon cancer Paternal Grandmother      Prior to Admission medications   Medication Sig Start Date End Date Taking? Authorizing Provider  afatinib dimaleate (GILOTRIF) 40 MG tablet Take 1 tablet (40 mg total) by mouth daily. Take on an empty stomach 1hr before or 2 hrs after meals. 01/01/17  Yes Curt Bears, MD  clindamycin (CLINDAGEL) 1 % gel Apply topically 2 (two) times  daily. 11/20/16  Yes Curcio, Roselie Awkward, NP  loperamide (IMODIUM) 1 MG/5ML solution Take 2 mg as needed by mouth for diarrhea or loose stools.   Yes [provider]  prochlorperazine (COMPAZINE) 10 MG tablet Take 1 tablet (10 mg total) every 6 (six) hours as needed by mouth for nausea or vomiting. 12/05/16  Yes Curcio, Roselie Awkward, NP  furosemide (LASIX) 40 MG tablet Take 1 tablet (40 mg total) by mouth daily. 01/09/17 04/09/17  Pixie Casino, MD    Physical Exam: Vitals:   01/09/17 1806 01/09/17 1954 01/09/17 2000 01/09/17 2015  BP: 111/77 115/90 100/80 (!) 115/93  Pulse: 94 94 90 87  Resp: 16 17 16  (!) 22  Temp:  97.9 F (36.6 C)    TempSrc:  Oral    SpO2: 99% 100% 98% 96%  Weight:      Height:       General: Not in acute distress HEENT:       Eyes: PERRL, EOMI, no scleral icterus.       ENT: No discharge from the ears and nose, no pharynx injection, no tonsillar enlargement.        Neck: No JVD, no bruit, no mass felt. Heme: No neck lymph node enlargement. Cardiac: S1/S2, RRR, No murmurs, No gallops or rubs. Respiratory:  No rales, wheezing, rhonchi or rubs. GI: Soft, nondistended, nontender, no rebound pain, no organomegaly, BS present. GU: No hematuria Ext: 1+ pitting leg edema bilaterally. 2+DP/PT pulse bilaterally. Musculoskeletal: No joint deformities, No joint redness or warmth, no limitation of ROM in spin. Skin: No rashes.  Neuro: Alert, oriented X3, cranial nerves II-XII grossly intact, moves all extremities normally.  Psych: Patient is not psychotic, no suicidal or hemocidal ideation.  Labs on Admission: I have personally reviewed following labs and imaging studies  CBC: Recent Labs  Lab 01/08/17 1055 01/09/17 1510  WBC 10.7* 12.6*  NEUTROABS 7.6*  --   HGB 10.2* 10.9*  HCT 31.8* 34.2*  MCV 83.8 83.8  PLT 268 557   Basic Metabolic Panel: Recent Labs  Lab 01/08/17 1055 01/09/17 1510 01/09/17 2221  NA 135* 133*  --   K 3.2* 3.1*  --   CL  --   96*  --   CO2 25 23  --   GLUCOSE 109 103*  --   BUN 14.6 12  --   CREATININE 1.3* 1.23*  --   CALCIUM 9.5 9.6  --   MG  --   --  2.1   GFR: Estimated Creatinine Clearance: 66.2 mL/min (A) (by C-G formula based on SCr of 1.23 mg/dL (H)). Liver Function Tests: Recent Labs  Lab 01/08/17 1055  AST 31  ALT 34  ALKPHOS 82  BILITOT 1.74*  PROT 7.1  ALBUMIN 3.6   No results for input(s): LIPASE, AMYLASE in the last 168 hours. No results for input(s): AMMONIA in the last 168 hours. Coagulation Profile: Recent Labs  Lab 01/09/17 2221  INR 1.49   Cardiac Enzymes: No results for input(s): CKTOTAL, CKMB, CKMBINDEX, TROPONINI in the last 168  hours. BNP (last 3 results) No results for input(s): PROBNP in the last 8760 hours. HbA1C: No results for input(s): HGBA1C in the last 72 hours. CBG: No results for input(s): GLUCAP in the last 168 hours. Lipid Profile: No results for input(s): CHOL, HDL, LDLCALC, TRIG, CHOLHDL, LDLDIRECT in the last 72 hours. Thyroid Function Tests: No results for input(s): TSH, T4TOTAL, FREET4, T3FREE, THYROIDAB in the last 72 hours. Anemia Panel: No results for input(s): VITAMINB12, FOLATE, FERRITIN, TIBC, IRON, RETICCTPCT in the last 72 hours. Urine analysis:    Component Value Date/Time   LABSPEC 1.020 07/22/2010 2017   PHURINE 5.0 07/22/2010 2017   GLUCOSEU NEGATIVE 07/22/2010 2017   HGBUR NEGATIVE 07/22/2010 2017   BILIRUBINUR NEGATIVE 07/22/2010 2017   KETONESUR NEGATIVE 07/22/2010 2017   PROTEINUR NEGATIVE 07/22/2010 2017   UROBILINOGEN 1.0 07/22/2010 2017   NITRITE NEGATIVE 07/22/2010 2017   LEUKOCYTESUR NEGATIVE 07/22/2010 2017   Sepsis Labs: @LABRCNTIP (procalcitonin:4,lacticidven:4) )No results found for this or any previous visit (from the past 240 hour(s)).   Radiological Exams on Admission: Dg Chest 2 View  Result Date: 01/09/2017 CLINICAL DATA:  Shortness of Breath EXAM: CHEST  2 VIEW COMPARISON:  Chest CT January 08, 2017.  Chest radiograph October 21, 2016 FINDINGS: There are fairly small pleural effusions bilaterally. There is patchy airspace opacity in the left upper lobe as well as in both lower lobes. There is mild upper lobe interstitial edema. There is cardiomegaly with suspected pericardial effusion. Note that there is splaying of the carina. No adenopathy. No bone lesions appreciable. IMPRESSION: Cardiomegaly with suspected pericardial effusion. Pleural effusions bilaterally with areas of patchy airspace opacity, consistent with either pneumonia or alveolar edema. Both entities may be present concurrently. There is mild upper lobe interstitial edema. Electronically Signed   By: Lowella Grip III M.D.   On: 01/09/2017 16:34   Ct Chest W Contrast  Result Date: 01/08/2017 CLINICAL DATA:  Stage IV lung cancer. EXAM: CT CHEST, ABDOMEN, AND PELVIS WITH CONTRAST TECHNIQUE: Multidetector CT imaging of the chest, abdomen and pelvis was performed following the standard protocol during bolus administration of intravenous contrast. CONTRAST:  114m ISOVUE-300 IOPAMIDOL (ISOVUE-300) INJECTION 61% COMPARISON:  PET-CT 10/31/2016 and chest CT 10/10/2016 FINDINGS: CT CHEST FINDINGS Cardiovascular: Very large pericardial effusion which measures 36 Hounsfield units suggesting complex fluid possibly due to pericardial metastatic disease. The heart is normal in size. No aortic dissection or aneurysm. The branch vessels are patent. Mediastinum/Nodes: No obvious mediastinal or hilar lymphadenopathy. The esophagus is grossly normal. Lungs/Pleura: Large right pleural effusion and small to moderate size left pleural effusion with overlying atelectasis. There is also persistent diffuse nodular interstitial thickening likely reflecting interstitial spread of tumor. Musculoskeletal: Possible sclerotic metastatic lesion involving the left aspect of T4. No other definite bone lesions. A few tiny sclerotic lesions are possibly bone islands. No  breast masses, supraclavicular or axillary lymphadenopathy. Small scattered lymph nodes are noted. CT ABDOMEN PELVIS FINDINGS Hepatobiliary: No focal hepatic lesions to suggest metastatic disease. The gallbladder contains some high attenuation tear which could be sludge or stones. No common bile duct dilatation. Pancreas: No mass, inflammation or ductal dilatation. Spleen: Normal size.  No focal lesions. Adrenals/Urinary Tract: The adrenal glands and kidneys are unremarkable. The bladder is unremarkable. Stomach/Bowel: The stomach, duodenum, small bowel and colon are grossly normal without oral contrast. No inflammatory changes, mass lesions or obstructive findings. The terminal ileum and appendix are normal. Vascular/Lymphatic: Moderate distal aortic and proximal iliac artery calcifications but no aneurysm or dissection.  The branch vessels are patent. The major venous structures are patent. Small scattered mesenteric and retroperitoneal lymph nodes but no mass or overt adenopathy. Reproductive: Surgically absent. Other: Small amount free pelvic fluid. No pelvic mass or adenopathy. No inguinal mass or adenopathy. Musculoskeletal: No definite findings for osseous metastatic disease. IMPRESSION: 1. Very large pericardial effusion, significantly increased since the PET-CT of October. 2. Large right and small to moderate left pleural effusions. 3. Nodular interstitial thickening in the lungs most consistent with interstitial spread of tumor and consistent with prior PET-CT findings. 4. No old abdominal/pelvic metastatic disease is identified. 5. Sclerotic metastatic focus involving the T4 vertebral body. This was also hot on the prior PET scan. These results will be called to the ordering clinician or representative by the Radiologist Assistant, and communication documented in the PACS or zVision Dashboard. Electronically Signed   By: Marijo Sanes M.D.   On: 01/08/2017 12:49   Ct Abdomen Pelvis W Contrast  Result  Date: 01/08/2017 CLINICAL DATA:  Stage IV lung cancer. EXAM: CT CHEST, ABDOMEN, AND PELVIS WITH CONTRAST TECHNIQUE: Multidetector CT imaging of the chest, abdomen and pelvis was performed following the standard protocol during bolus administration of intravenous contrast. CONTRAST:  139m ISOVUE-300 IOPAMIDOL (ISOVUE-300) INJECTION 61% COMPARISON:  PET-CT 10/31/2016 and chest CT 10/10/2016 FINDINGS: CT CHEST FINDINGS Cardiovascular: Very large pericardial effusion which measures 36 Hounsfield units suggesting complex fluid possibly due to pericardial metastatic disease. The heart is normal in size. No aortic dissection or aneurysm. The branch vessels are patent. Mediastinum/Nodes: No obvious mediastinal or hilar lymphadenopathy. The esophagus is grossly normal. Lungs/Pleura: Large right pleural effusion and small to moderate size left pleural effusion with overlying atelectasis. There is also persistent diffuse nodular interstitial thickening likely reflecting interstitial spread of tumor. Musculoskeletal: Possible sclerotic metastatic lesion involving the left aspect of T4. No other definite bone lesions. A few tiny sclerotic lesions are possibly bone islands. No breast masses, supraclavicular or axillary lymphadenopathy. Small scattered lymph nodes are noted. CT ABDOMEN PELVIS FINDINGS Hepatobiliary: No focal hepatic lesions to suggest metastatic disease. The gallbladder contains some high attenuation tear which could be sludge or stones. No common bile duct dilatation. Pancreas: No mass, inflammation or ductal dilatation. Spleen: Normal size.  No focal lesions. Adrenals/Urinary Tract: The adrenal glands and kidneys are unremarkable. The bladder is unremarkable. Stomach/Bowel: The stomach, duodenum, small bowel and colon are grossly normal without oral contrast. No inflammatory changes, mass lesions or obstructive findings. The terminal ileum and appendix are normal. Vascular/Lymphatic: Moderate distal aortic and  proximal iliac artery calcifications but no aneurysm or dissection. The branch vessels are patent. The major venous structures are patent. Small scattered mesenteric and retroperitoneal lymph nodes but no mass or overt adenopathy. Reproductive: Surgically absent. Other: Small amount free pelvic fluid. No pelvic mass or adenopathy. No inguinal mass or adenopathy. Musculoskeletal: No definite findings for osseous metastatic disease. IMPRESSION: 1. Very large pericardial effusion, significantly increased since the PET-CT of October. 2. Large right and small to moderate left pleural effusions. 3. Nodular interstitial thickening in the lungs most consistent with interstitial spread of tumor and consistent with prior PET-CT findings. 4. No old abdominal/pelvic metastatic disease is identified. 5. Sclerotic metastatic focus involving the T4 vertebral body. This was also hot on the prior PET scan. These results will be called to the ordering clinician or representative by the Radiologist Assistant, and communication documented in the PACS or zVision Dashboard. Electronically Signed   By: PMarijo SanesM.D.   On:  01/08/2017 12:49     EKG: Independently reviewed. Sinus rhythm, low voltage, Q waves in inferior leads.   Assessment/Plan Principal Problem:   Pericardial effusion Active Problems:   Adenocarcinoma of left lung, stage 4 ([facility])   Essential hypertension   Pleural effusion   Hypokalemia   Dyspnea   Leukocytosis   AKI (acute kidney injury) ([facility])  Pericardial effusion and bilateral pleural effusion: Most likely due to malignancy. 2-D echo showed massive pericardial effusion with signs of early stage of tamponade, but patient is clinically stable. Heart rate is 90s, respiration rate 22, blood pressure 115/93. No acute respiratory distress, chest pain or palpitation. Thoracic surgeon was consulted, Dr. Prescott Gum will do procedure tomorrow.  -will admit to [facility] as inpt -Monitor vital signs closely for  possible deteriorating to tamponade -NPO -check INR/PTT -prn albuterol nebs and mucinex -continue home lasix and give one dose of IV lasix 40 mg   Dyspareunia: Most likely due to pleural effusion and pericardial effusion -See above  Adenocarcinoma of left lung, stage 4 ([place]): followed by dr. Julien Nordmann. -Continue Afatinib  HTN:  -Continue home medications: lasix -IV hydralazine prn  Hypokalemia: K= 3.1 on admission. - Repleted - Check Mg level  Leukocytosis: WBC 12.6. No fever. No source of infection identified. -get blood culture, urinalysis. Urine culture  AKI: Likely due to prerenal secondary to dehydration and continuation of diuretics -will monitor renal Fx by BMP -since pt is at risk of developing tamponade, will not hold off diuretics tonight.   DVT ppx: SCD Code Status: Full code Family Communication:  Yes, patient's sister at bed side Disposition Plan:  Anticipate discharge back to previous home environment Consults called:  none Admission status:  Inpatient/tele       Date of Service 01/10/2017    [name] [facility] Pager [phone]  If 7PM-7AM, please contact night-coverage [email or web address] Password [email or web address] 01/10/2017, 12:19 AM

## 2017-01-09 NOTE — ED Triage Notes (Signed)
Per Pt, Pt is coming from [facility] with complaints of SOB and had an echo completed where she was noted to have fluids around her lungs and heart. Pt was sent over to the ED to be evaluated and admitted.

## 2017-01-09 NOTE — Progress Notes (Signed)
OFFICE CONSULT NOTE  Chief Complaint:  Shortness of breath  Primary Care Physician: Copland, Gay Filler, MD  HPI:  Nancy Moore is a 51 y.o. female who is being seen today for the evaluation of shortness of breath at the request of Curcio, Roselie Awkward, NP. This is an unfortunate 51 yo female patient of Dr. Julien Moore with stage 4 adenocarcinoma of the lung and history of hypertension. She recently had a re-staging CT scan of her chest which showed a large, complex pericardial effusion as well as a large right pleural effusion. An urgent referral to cardiology was made for today and she was also ordered to have a thoracentesis as an outpatient. In the office today, she reports marked shortness of breath (she is on  oxygen) with exertion, chest heaviness and significant leg edema. She said she has had weeping through the skin behind her knees. I shared the CT scan results with her today, however, she seemed surprised by the findings and says she wasn't told the results, although it was documented that they were discussed with her this morning.   PMHx:  Past Medical History:  Diagnosis Date  . Adenocarcinoma of left lung, stage 4 ([place]) 10/28/2016  . Goals of care, counseling/discussion 10/28/2016  . Hypertension     Past Surgical History:  Procedure Laterality Date  . BREAST EXCISIONAL BIOPSY Left 20+ yrs ago   benign  . VIDEO BRONCHOSCOPY Bilateral 10/21/2016   Procedure: VIDEO BRONCHOSCOPY WITH FLUORO;  Surgeon: Tanda Rockers, MD;  Location: [facility];  Service: Cardiopulmonary;  Laterality: Bilateral;    FAMHx:  Family History  Problem Relation Age of Onset  . Asthma Mother   . Stroke Mother   . Hypertension Mother   . Heart attack Father   . Hypertension Father   . Hyperlipidemia Father   . Dementia Father   . Emphysema Maternal Grandmother   . Hypertension Maternal Grandmother   . Colon cancer Paternal Grandmother     SOCHx:   reports that  has never smoked. she has  never used smokeless tobacco. She reports that she drinks alcohol. She reports that she does not use drugs.  ALLERGIES:  Allergies  Allergen Reactions  . Percocet [Oxycodone-Acetaminophen] Other (See Comments)    "makes me dizzy"  . Lisinopril Cough    ROS: Pertinent items noted in HPI and remainder of comprehensive ROS otherwise negative.  HOME MEDS: Current Outpatient Medications on File Prior to Visit  Medication Sig Dispense Refill  . afatinib dimaleate (GILOTRIF) 40 MG tablet Take 1 tablet (40 mg total) by mouth daily. Take on an empty stomach 1hr before or 2 hrs after meals. 30 tablet 1  . clindamycin (CLINDAGEL) 1 % gel Apply topically 2 (two) times daily. 30 g 1  . Dextromethorphan-Guaifenesin (DELSYM COUGH/CHEST CONGEST DM) 5-100 MG/5ML LIQD Take 10 mLs by mouth 2 (two) times daily as needed (coughing).    Marland Kitchen loperamide (IMODIUM) 1 MG/5ML solution Take 2 mg as needed by mouth for diarrhea or loose stools.    . prochlorperazine (COMPAZINE) 10 MG tablet Take 1 tablet (10 mg total) every 6 (six) hours as needed by mouth for nausea or vomiting. 30 tablet 1   No current facility-administered medications on file prior to visit.     LABS/IMAGING: Results for orders placed or performed in visit on 01/08/17 (from the past 48 hour(s))  Comprehensive metabolic panel     Status: Abnormal   Collection Time: 01/08/17 10:55 AM  Result Value Ref  Range   Sodium 135 (L) 136 - 145 mEq/L   Potassium 3.2 (L) 3.5 - 5.1 mEq/L   Chloride 97 (L) 98 - 109 mEq/L   CO2 25 22 - 29 mEq/L   Glucose 109 70 - 140 mg/dl    Comment: Glucose reference range is for nonfasting patients. Fasting glucose reference range is 70- 100.   BUN 14.6 7.0 - 26.0 mg/dL   Creatinine 1.3 (H) 0.6 - 1.1 mg/dL   Total Bilirubin 1.74 (H) 0.20 - 1.20 mg/dL   Alkaline Phosphatase 82 40 - 150 U/L   AST 31 5 - 34 U/L   ALT 34 0 - 55 U/L   Total Protein 7.1 6.4 - 8.3 g/dL   Albumin 3.6 3.5 - 5.0 g/dL   Calcium 9.5 8.4 - 10.4  mg/dL   Anion Gap 14 (H) 3 - 11 mEq/L   EGFR 56 (L) >60 ml/min/1.73 m2    Comment: eGFR is calculated using the CKD-EPI Creatinine Equation (2009)  CBC with Differential/Platelet     Status: Abnormal   Collection Time: 01/08/17 10:55 AM  Result Value Ref Range   WBC 10.7 (H) 3.9 - 10.3 10e3/uL   NEUT# 7.6 (H) 1.5 - 6.5 10e3/uL   HGB 10.2 (L) 11.6 - 15.9 g/dL   HCT 31.8 (L) 34.8 - 46.6 %   Platelets 268 145 - 400 10e3/uL   MCV 83.8 79.5 - 101.0 fL   MCH 26.9 25.1 - 34.0 pg   MCHC 32.1 31.5 - 36.0 g/dL   RBC 3.80 3.70 - 5.45 10e6/uL   RDW 17.1 (H) 11.2 - 14.5 %   lymph# 1.2 0.9 - 3.3 10e3/uL   MONO# 1.3 (H) 0.1 - 0.9 10e3/uL   Eosinophils Absolute 0.5 0.0 - 0.5 10e3/uL   Basophils Absolute 0.1 0.0 - 0.1 10e3/uL   NEUT% 71.2 38.4 - 76.8 %   LYMPH% 11.0 (L) 14.0 - 49.7 %   MONO% 12.4 0.0 - 14.0 %   EOS% 4.6 0.0 - 7.0 %   BASO% 0.8 0.0 - 2.0 %   Ct Chest W Contrast  Result Date: 01/08/2017 CLINICAL DATA:  Stage IV lung cancer. EXAM: CT CHEST, ABDOMEN, AND PELVIS WITH CONTRAST TECHNIQUE: Multidetector CT imaging of the chest, abdomen and pelvis was performed following the standard protocol during bolus administration of intravenous contrast. CONTRAST:  141m ISOVUE-300 IOPAMIDOL (ISOVUE-300) INJECTION 61% COMPARISON:  PET-CT 10/31/2016 and chest CT 10/10/2016 FINDINGS: CT CHEST FINDINGS Cardiovascular: Very large pericardial effusion which measures 36 Hounsfield units suggesting complex fluid possibly due to pericardial metastatic disease. The heart is normal in size. No aortic dissection or aneurysm. The branch vessels are patent. Mediastinum/Nodes: No obvious mediastinal or hilar lymphadenopathy. The esophagus is grossly normal. Lungs/Pleura: Large right pleural effusion and small to moderate size left pleural effusion with overlying atelectasis. There is also persistent diffuse nodular interstitial thickening likely reflecting interstitial spread of tumor. Musculoskeletal: Possible  sclerotic metastatic lesion involving the left aspect of T4. No other definite bone lesions. A few tiny sclerotic lesions are possibly bone islands. No breast masses, supraclavicular or axillary lymphadenopathy. Small scattered lymph nodes are noted. CT ABDOMEN PELVIS FINDINGS Hepatobiliary: No focal hepatic lesions to suggest metastatic disease. The gallbladder contains some high attenuation tear which could be sludge or stones. No common bile duct dilatation. Pancreas: No mass, inflammation or ductal dilatation. Spleen: Normal size.  No focal lesions. Adrenals/Urinary Tract: The adrenal glands and kidneys are unremarkable. The bladder is unremarkable. Stomach/Bowel: The stomach, duodenum, small  bowel and colon are grossly normal without oral contrast. No inflammatory changes, mass lesions or obstructive findings. The terminal ileum and appendix are normal. Vascular/Lymphatic: Moderate distal aortic and proximal iliac artery calcifications but no aneurysm or dissection. The branch vessels are patent. The major venous structures are patent. Small scattered mesenteric and retroperitoneal lymph nodes but no mass or overt adenopathy. Reproductive: Surgically absent. Other: Small amount free pelvic fluid. No pelvic mass or adenopathy. No inguinal mass or adenopathy. Musculoskeletal: No definite findings for osseous metastatic disease. IMPRESSION: 1. Very large pericardial effusion, significantly increased since the PET-CT of October. 2. Large right and small to moderate left pleural effusions. 3. Nodular interstitial thickening in the lungs most consistent with interstitial spread of tumor and consistent with prior PET-CT findings. 4. No old abdominal/pelvic metastatic disease is identified. 5. Sclerotic metastatic focus involving the T4 vertebral body. This was also hot on the prior PET scan. These results will be called to the ordering clinician or representative by the Radiologist Assistant, and communication  documented in the PACS or zVision Dashboard. Electronically Signed   By: Marijo Sanes M.D.   On: 01/08/2017 12:49   Ct Abdomen Pelvis W Contrast  Result Date: 01/08/2017 CLINICAL DATA:  Stage IV lung cancer. EXAM: CT CHEST, ABDOMEN, AND PELVIS WITH CONTRAST TECHNIQUE: Multidetector CT imaging of the chest, abdomen and pelvis was performed following the standard protocol during bolus administration of intravenous contrast. CONTRAST:  165m ISOVUE-300 IOPAMIDOL (ISOVUE-300) INJECTION 61% COMPARISON:  PET-CT 10/31/2016 and chest CT 10/10/2016 FINDINGS: CT CHEST FINDINGS Cardiovascular: Very large pericardial effusion which measures 36 Hounsfield units suggesting complex fluid possibly due to pericardial metastatic disease. The heart is normal in size. No aortic dissection or aneurysm. The branch vessels are patent. Mediastinum/Nodes: No obvious mediastinal or hilar lymphadenopathy. The esophagus is grossly normal. Lungs/Pleura: Large right pleural effusion and small to moderate size left pleural effusion with overlying atelectasis. There is also persistent diffuse nodular interstitial thickening likely reflecting interstitial spread of tumor. Musculoskeletal: Possible sclerotic metastatic lesion involving the left aspect of T4. No other definite bone lesions. A few tiny sclerotic lesions are possibly bone islands. No breast masses, supraclavicular or axillary lymphadenopathy. Small scattered lymph nodes are noted. CT ABDOMEN PELVIS FINDINGS Hepatobiliary: No focal hepatic lesions to suggest metastatic disease. The gallbladder contains some high attenuation tear which could be sludge or stones. No common bile duct dilatation. Pancreas: No mass, inflammation or ductal dilatation. Spleen: Normal size.  No focal lesions. Adrenals/Urinary Tract: The adrenal glands and kidneys are unremarkable. The bladder is unremarkable. Stomach/Bowel: The stomach, duodenum, small bowel and colon are grossly normal without oral  contrast. No inflammatory changes, mass lesions or obstructive findings. The terminal ileum and appendix are normal. Vascular/Lymphatic: Moderate distal aortic and proximal iliac artery calcifications but no aneurysm or dissection. The branch vessels are patent. The major venous structures are patent. Small scattered mesenteric and retroperitoneal lymph nodes but no mass or overt adenopathy. Reproductive: Surgically absent. Other: Small amount free pelvic fluid. No pelvic mass or adenopathy. No inguinal mass or adenopathy. Musculoskeletal: No definite findings for osseous metastatic disease. IMPRESSION: 1. Very large pericardial effusion, significantly increased since the PET-CT of October. 2. Large right and small to moderate left pleural effusions. 3. Nodular interstitial thickening in the lungs most consistent with interstitial spread of tumor and consistent with prior PET-CT findings. 4. No old abdominal/pelvic metastatic disease is identified. 5. Sclerotic metastatic focus involving the T4 vertebral body. This was also hot on the prior  PET scan. These results will be called to the ordering clinician or representative by the Radiologist Assistant, and communication documented in the PACS or zVision Dashboard. Electronically Signed   By: Marijo Sanes M.D.   On: 01/08/2017 12:49    LIPID PANEL: No results found for: CHOL, TRIG, HDL, CHOLHDL, VLDL, LDLCALC, LDLDIRECT  WEIGHTS: Wt Readings from Last 3 Encounters:  01/09/17 226 lb (102.5 kg)  12/24/16 232 lb 3.2 oz (105.3 kg)  12/17/16 233 lb (105.7 kg)    VITALS: BP 107/75   Pulse 92   Ht _0  (1.676 m)   Wt 226 lb (102.5 kg)   LMP 03/15/2010   BMI 36.48 kg/m   EXAM: General appearance: alert, mild distress, moderately obese and on nasal cannula oxygen, coughing Neck: JVD - 5 cm above sternal notch, no carotid bruit and thyroid not enlarged, symmetric, no tenderness/mass/nodules Lungs: diminished breath sounds bilaterally and dullness to  percussion RLL, RML and RUL Heart: regular rate and rhythm Abdomen: obese, soft, non-tender Extremities: edema 3+ edema to mid-thigh bilaterally Pulses: 2+ and symmetric Skin: Skin color, texture, turgor normal. No rashes or lesions Neurologic: Mental status: Awake, somewhat slow of thought Psych: Pleasant  EKG: Sinus rhythm with low voltage  - personally reviewed  ASSESSMENT: 1. Large pericardial effusion (supicious for malignant effusion) - possible tamponade physiology 2. Large right pleural effusion 3. Stage IV adenocarcinoma of the lung  4. Hypotension  PLAN: 1.   Nancy Moore has a large, complex pericardial effusion on echo which is likely malignant. I have arranged for a STAT echo today - if there are signs of tamponade physiology, I would push for admission. She will need a CT surgery consultation to drain her pericardial and pleural effusions which are likely malignant. Would also recommend starting lasix diuresis. An echo has been scheduled for around 12:30 today in our [facility]. I will follow-up on those results and keep you posted.  Thanks for the urgent referral.  Pixie Casino, MD, FACC, [place] Director of the [facility] Attending Cardiologist  Direct Dial: [phone]  Fax: [phone]  Website:  [email or web address] 01/09/2017, 2:36 PM

## 2017-01-09 NOTE — ED Notes (Addendum)
Per family pt is stage IV lung CA.  Sent by cardiology today by their understanding for "direct admit"  Pt states she was at her cardiologists office today and was told she had "too much fluid around her heart for them to draw off in the office" so they had sent her here for "admission and to have the fluid drawn off"  No direct admit orders were found on pt arrival.  Pt family is very upset as they expected the pt to be admitted directly and not have prolonged wait and be seen in the ED.  Pt and family reassured.  EDP [place] aware.

## 2017-01-09 NOTE — Telephone Encounter (Signed)
Copied from [place]. Topic: Referral - Request >> Jan 09, 2017  2:19 PM Scherrie Gerlach wrote:  Reason for CRM: Suanne Marker with [facility] states the pt needs a referral from her Insurance co, not from the dr. This needs approval due to insurance being [employer] The echo was approved, but they need the office visit from today approved and pt has return visits coming up.  Needs to have authorized today. thanks Suanne Marker can be reached at [phone] until 2:30 and after that; pam at [phone]

## 2017-01-09 NOTE — Progress Notes (Signed)
Notified Dr. Debara Pickett and DOD, Dr. Radford Pax of pericardial effusion findings. Patient was discharged to the ER. Patient was okayed by both doctors to travel to the ER by family vehicle. At the time of discharge, patient was stable and oriented. Echocardiogram was read STAT by Dr. Acie Fredrickson.

## 2017-01-09 NOTE — Telephone Encounter (Signed)
Received a call from cardiology stating their office need [place] to put in an urgent referral this morning for Pt's visit earlier this morning and stated that it should include at least 6 additional authorized visits for follow ups. Orders and referral was placed in system and will be followed up with a call to [facility] to make sure everything is properly authorized. Details in notes prior from previous visit which lead to urgent referral.

## 2017-01-10 ENCOUNTER — Encounter (HOSPITAL_COMMUNITY)
Admission: EM | Disposition: A | Discharge status: Skilled Nursing Facility | Payer: Self-pay | Source: Home / Self Care | Attending: Family Medicine

## 2017-01-10 ENCOUNTER — Inpatient Hospital Stay (HOSPITAL_COMMUNITY): Payer: 59

## 2017-01-10 ENCOUNTER — Inpatient Hospital Stay (HOSPITAL_COMMUNITY): Payer: 59 | Admitting: Anesthesiology

## 2017-01-10 ENCOUNTER — Other Ambulatory Visit: Payer: Self-pay

## 2017-01-10 ENCOUNTER — Encounter (HOSPITAL_COMMUNITY): Payer: Self-pay | Admitting: Anesthesiology

## 2017-01-10 DIAGNOSIS — I313 Pericardial effusion (noninflammatory): Secondary | ICD-10-CM

## 2017-01-10 DIAGNOSIS — N179 Acute kidney failure, unspecified: Secondary | ICD-10-CM | POA: Diagnosis present

## 2017-01-10 HISTORY — PX: PERICARDIAL WINDOW: SHX2213

## 2017-01-10 LAB — CBC
HCT: 32.3 % — ABNORMAL LOW (ref 36.0–46.0)
Hemoglobin: 10.7 g/dL — ABNORMAL LOW (ref 12.0–15.0)
MCH: 27.4 pg (ref 26.0–34.0)
MCHC: 33.1 g/dL (ref 30.0–36.0)
MCV: 82.6 fL (ref 78.0–100.0)
Platelets: 293 10*3/uL (ref 150–400)
RBC: 3.91 MIL/uL (ref 3.87–5.11)
RDW: 16.2 % — ABNORMAL HIGH (ref 11.5–15.5)
WBC: 13.2 10*3/uL — ABNORMAL HIGH (ref 4.0–10.5)

## 2017-01-10 LAB — URINALYSIS, ROUTINE W REFLEX MICROSCOPIC
Bilirubin Urine: NEGATIVE
Glucose, UA: NEGATIVE mg/dL
Ketones, ur: NEGATIVE mg/dL
Nitrite: NEGATIVE
Protein, ur: NEGATIVE mg/dL
Specific Gravity, Urine: 1.005 (ref 1.005–1.030)
Squamous Epithelial / LPF: NONE SEEN
pH: 5 (ref 5.0–8.0)

## 2017-01-10 LAB — HIV ANTIBODY (ROUTINE TESTING W REFLEX): HIV Screen 4th Generation wRfx: NONREACTIVE

## 2017-01-10 LAB — COMPREHENSIVE METABOLIC PANEL
ALT: 34 U/L (ref 14–54)
AST: 33 U/L (ref 15–41)
Albumin: 3.5 g/dL (ref 3.5–5.0)
Alkaline Phosphatase: 81 U/L (ref 38–126)
Anion gap: 11 (ref 5–15)
BUN: 14 mg/dL (ref 6–20)
CO2: 26 mmol/L (ref 22–32)
Calcium: 9.2 mg/dL (ref 8.9–10.3)
Chloride: 95 mmol/L — ABNORMAL LOW (ref 101–111)
Creatinine, Ser: 1.26 mg/dL — ABNORMAL HIGH (ref 0.44–1.00)
GFR calc Af Amer: 57 mL/min — ABNORMAL LOW (ref >60)
GFR calc non Af Amer: 49 mL/min — ABNORMAL LOW (ref >60)
Glucose, Bld: 101 mg/dL — ABNORMAL HIGH (ref 65–99)
Potassium: 3.2 mmol/L — ABNORMAL LOW (ref 3.5–5.1)
Sodium: 132 mmol/L — ABNORMAL LOW (ref 135–145)
Total Bilirubin: 1.6 mg/dL — ABNORMAL HIGH (ref 0.3–1.2)
Total Protein: 6.8 g/dL (ref 6.5–8.1)

## 2017-01-10 LAB — I-STAT ARTERIAL BLOOD GAS, ED
Acid-Base Excess: 1 mmol/L (ref 0.0–2.0)
Bicarbonate: 23.3 mmol/L (ref 20.0–28.0)
O2 Saturation: 99 %
Patient temperature: 97.9
TCO2: 24 mmol/L (ref 22–32)
pCO2 arterial: 28.7 mmHg — ABNORMAL LOW (ref 32.0–48.0)
pH, Arterial: 7.517 — ABNORMAL HIGH (ref 7.350–7.450)
pO2, Arterial: 132 mmHg — ABNORMAL HIGH (ref 83.0–108.0)

## 2017-01-10 LAB — PROCALCITONIN: Procalcitonin: <0.1 ng/mL

## 2017-01-10 LAB — APTT: aPTT: 31 seconds (ref 24–36)

## 2017-01-10 LAB — PROTIME-INR
INR: 1.42
Prothrombin Time: 17.2 seconds — ABNORMAL HIGH (ref 11.4–15.2)

## 2017-01-10 LAB — ABO/RH: ABO/RH(D): O NEG

## 2017-01-10 LAB — SURGICAL PCR SCREEN
MRSA, PCR: NEGATIVE
Staphylococcus aureus: POSITIVE — AB

## 2017-01-10 LAB — PREPARE RBC (CROSSMATCH)

## 2017-01-10 LAB — LACTIC ACID, PLASMA: Lactic Acid, Venous: 1.7 mmol/L (ref 0.5–1.9)

## 2017-01-10 SURGERY — CREATION, PERICARDIAL WINDOW
Anesthesia: General

## 2017-01-10 MED ORDER — CLINDAMYCIN PHOSPHATE 1 % EX GEL
Freq: Two times a day (BID) | CUTANEOUS | Status: DC
  Administered 2017-01-12 – 2017-01-13 (×2): via TOPICAL
  Administered 2017-01-13: 1 via TOPICAL
  Administered 2017-01-14 – 2017-01-20 (×7): via TOPICAL
  Administered 2017-01-20: 1 via TOPICAL
  Administered 2017-01-21 – 2017-01-22 (×2): via TOPICAL
  Filled 2017-01-10 (×2): qty 30

## 2017-01-10 MED ORDER — SODIUM CHLORIDE 0.9% FLUSH: 9.0000 mL | INTRAVENOUS | Status: DC | PRN

## 2017-01-10 MED ORDER — DEXTROSE 5 % IV SOLN
1.5000 g | INTRAVENOUS | Status: AC
  Administered 2017-01-10: 1.5 g via INTRAVENOUS
  Filled 2017-01-10: qty 1.5

## 2017-01-10 MED ORDER — ARTIFICIAL TEARS OPHTHALMIC OINT
TOPICAL_OINTMENT | OPHTHALMIC | Status: AC
  Filled 2017-01-10: qty 3.5

## 2017-01-10 MED ORDER — FENTANYL CITRATE (PF) 100 MCG/2ML IJ SOLN
INTRAMUSCULAR | Status: DC | PRN
  Administered 2017-01-10: 25 ug via INTRAVENOUS
  Administered 2017-01-10: 100 ug via INTRAVENOUS

## 2017-01-10 MED ORDER — ETOMIDATE 2 MG/ML IV SOLN
INTRAVENOUS | Status: AC
  Filled 2017-01-10: qty 10

## 2017-01-10 MED ORDER — DEXAMETHASONE SODIUM PHOSPHATE 10 MG/ML IJ SOLN
INTRAMUSCULAR | Status: AC
  Filled 2017-01-10: qty 1

## 2017-01-10 MED ORDER — LIDOCAINE 2% (20 MG/ML) 5 ML SYRINGE
INTRAMUSCULAR | Status: AC
  Filled 2017-01-10: qty 5

## 2017-01-10 MED ORDER — MIDAZOLAM HCL 2 MG/2ML IJ SOLN
INTRAMUSCULAR | Status: AC
  Filled 2017-01-10: qty 2

## 2017-01-10 MED ORDER — FENTANYL CITRATE (PF) 100 MCG/2ML IJ SOLN
25.0000 ug | INTRAMUSCULAR | Status: DC | PRN
  Administered 2017-01-10 (×2): 25 ug via INTRAVENOUS
  Administered 2017-01-10 (×3): 50 ug via INTRAVENOUS

## 2017-01-10 MED ORDER — FENTANYL CITRATE (PF) 100 MCG/2ML IJ SOLN
INTRAMUSCULAR | Status: AC
  Filled 2017-01-10: qty 2

## 2017-01-10 MED ORDER — ONDANSETRON HCL 4 MG/2ML IJ SOLN
4.0000 mg | Freq: Four times a day (QID) | INTRAMUSCULAR | Status: DC | PRN
  Filled 2017-01-10: qty 2

## 2017-01-10 MED ORDER — ALBUMIN HUMAN 5 % IV SOLN
INTRAVENOUS | Status: DC | PRN
  Administered 2017-01-10: 22:00:00 via INTRAVENOUS

## 2017-01-10 MED ORDER — MIDAZOLAM HCL 5 MG/5ML IJ SOLN
INTRAMUSCULAR | Status: DC | PRN
  Administered 2017-01-10: .5 mg via INTRAVENOUS

## 2017-01-10 MED ORDER — ROCURONIUM BROMIDE 100 MG/10ML IV SOLN
INTRAVENOUS | Status: DC | PRN
  Administered 2017-01-10: 20 mg via INTRAVENOUS
  Administered 2017-01-10: 30 mg via INTRAVENOUS

## 2017-01-10 MED ORDER — ONDANSETRON HCL 4 MG/2ML IJ SOLN
INTRAMUSCULAR | Status: DC | PRN
  Administered 2017-01-10: 4 mg via INTRAVENOUS

## 2017-01-10 MED ORDER — ONDANSETRON HCL 4 MG/2ML IJ SOLN
INTRAMUSCULAR | Status: AC
  Filled 2017-01-10: qty 2

## 2017-01-10 MED ORDER — NALOXONE HCL 0.4 MG/ML IJ SOLN
0.4000 mg | INTRAMUSCULAR | Status: DC | PRN
  Filled 2017-01-10: qty 1

## 2017-01-10 MED ORDER — DEXAMETHASONE SODIUM PHOSPHATE 10 MG/ML IJ SOLN
INTRAMUSCULAR | Status: DC | PRN
  Administered 2017-01-10: 10 mg via INTRAVENOUS

## 2017-01-10 MED ORDER — DIPHENHYDRAMINE HCL 50 MG/ML IJ SOLN
12.5000 mg | Freq: Four times a day (QID) | INTRAMUSCULAR | Status: DC | PRN
  Filled 2017-01-10: qty 0.25

## 2017-01-10 MED ORDER — SUGAMMADEX SODIUM 200 MG/2ML IV SOLN
INTRAVENOUS | Status: AC
  Filled 2017-01-10: qty 2

## 2017-01-10 MED ORDER — LACTATED RINGERS IV SOLN
INTRAVENOUS | Status: DC | PRN
  Administered 2017-01-10: 21:00:00 via INTRAVENOUS

## 2017-01-10 MED ORDER — SUCCINYLCHOLINE CHLORIDE 200 MG/10ML IV SOSY
PREFILLED_SYRINGE | INTRAVENOUS | Status: AC
  Filled 2017-01-10: qty 10

## 2017-01-10 MED ORDER — FLUCONAZOLE 100MG IVPB
100.0000 mg | Freq: Once | INTRAVENOUS | Status: AC
  Administered 2017-01-10: 100 mg via INTRAVENOUS
  Filled 2017-01-10: qty 50

## 2017-01-10 MED ORDER — FENTANYL CITRATE (PF) 250 MCG/5ML IJ SOLN
INTRAMUSCULAR | Status: AC
  Filled 2017-01-10: qty 5

## 2017-01-10 MED ORDER — SUGAMMADEX SODIUM 200 MG/2ML IV SOLN
INTRAVENOUS | Status: DC | PRN
  Administered 2017-01-10: 200 mg via INTRAVENOUS

## 2017-01-10 MED ORDER — ROCURONIUM BROMIDE 10 MG/ML (PF) SYRINGE
PREFILLED_SYRINGE | INTRAVENOUS | Status: AC
  Filled 2017-01-10: qty 5

## 2017-01-10 MED ORDER — ETOMIDATE 2 MG/ML IV SOLN
INTRAVENOUS | Status: DC | PRN
  Administered 2017-01-10: 10 mg via INTRAVENOUS

## 2017-01-10 MED ORDER — DIPHENHYDRAMINE HCL 12.5 MG/5ML PO ELIX
12.5000 mg | ORAL_SOLUTION | Freq: Four times a day (QID) | ORAL | Status: DC | PRN
  Filled 2017-01-10: qty 5

## 2017-01-10 MED ORDER — FENTANYL 40 MCG/ML IV SOLN
INTRAVENOUS | Status: DC
  Administered 2017-01-10: via INTRAVENOUS
  Administered 2017-01-11: 140 ug via INTRAVENOUS
  Administered 2017-01-11: 0 ug via INTRAVENOUS
  Administered 2017-01-12: 20 ug via INTRAVENOUS
  Administered 2017-01-12 (×2): 0 ug via INTRAVENOUS
  Administered 2017-01-12: 40 ug via INTRAVENOUS
  Administered 2017-01-13 (×2): 10 ug via INTRAVENOUS
  Administered 2017-01-13: 0 ug via INTRAVENOUS
  Administered 2017-01-13: 30 ug via INTRAVENOUS
  Administered 2017-01-13: 0 ug via INTRAVENOUS
  Administered 2017-01-13: 10 ug via INTRAVENOUS
  Administered 2017-01-14 (×3): 0 ug via INTRAVENOUS
  Administered 2017-01-14: 10 ug via INTRAVENOUS
  Filled 2017-01-10 (×2): qty 25

## 2017-01-10 MED ORDER — SUCCINYLCHOLINE CHLORIDE 20 MG/ML IJ SOLN
INTRAMUSCULAR | Status: DC | PRN
  Administered 2017-01-10: 120 mg via INTRAVENOUS

## 2017-01-10 MED ORDER — POTASSIUM CHLORIDE IN NACL 20-0.9 MEQ/L-% IV SOLN
INTRAVENOUS | Status: DC
  Administered 2017-01-11: 01:00:00 via INTRAVENOUS
  Filled 2017-01-10: qty 1000

## 2017-01-10 MED ORDER — DEXTROSE-NACL 5-0.45 % IV SOLN
INTRAVENOUS | Status: DC
  Administered 2017-01-10: 06:00:00 via INTRAVENOUS

## 2017-01-10 MED ORDER — LIDOCAINE HCL (CARDIAC) 20 MG/ML IV SOLN
INTRAVENOUS | Status: DC | PRN
  Administered 2017-01-10: 50 mg via INTRAVENOUS

## 2017-01-10 SURGICAL SUPPLY — 51 items
ADH SKN CLS APL DERMABOND .7 (GAUZE/BANDAGES/DRESSINGS) ×1
APL SKNCLS STERI-STRIP NONHPOA (GAUZE/BANDAGES/DRESSINGS) ×1
ATTRACTOMAT 16X20 MAGNETIC DRP (DRAPES) ×3 IMPLANT
BENZOIN TINCTURE PRP APPL 2/3 (GAUZE/BANDAGES/DRESSINGS) ×3 IMPLANT
CANISTER SUCT 3000ML PPV (MISCELLANEOUS) ×3 IMPLANT
CATH THORACIC 28FR (CATHETERS) IMPLANT
CATH THORACIC 28FR RT ANG (CATHETERS) IMPLANT
CATH THORACIC 36FR (CATHETERS) IMPLANT
CATH THORACIC 36FR RT ANG (CATHETERS) IMPLANT
CATH TROCAR 28FR (CATHETERS) ×2 IMPLANT
CLOSURE WOUND 1/2 X4 (GAUZE/BANDAGES/DRESSINGS) ×1
CONN ST 1/4X3/8  BEN (MISCELLANEOUS) ×2
CONN ST 1/4X3/8 BEN (MISCELLANEOUS) IMPLANT
CONT SPEC 4OZ CLIKSEAL STRL BL (MISCELLANEOUS) IMPLANT
COVER SURGICAL LIGHT HANDLE (MISCELLANEOUS) ×6 IMPLANT
DERMABOND ADVANCED (GAUZE/BANDAGES/DRESSINGS) ×2
DERMABOND ADVANCED .7 DNX12 (GAUZE/BANDAGES/DRESSINGS) IMPLANT
DRAIN CHANNEL 28F RND 3/8 FF (WOUND CARE) ×3 IMPLANT
DRAPE LAPAROSCOPIC ABDOMINAL (DRAPES) ×3 IMPLANT
ELECT REM PT RETURN 9FT ADLT (ELECTROSURGICAL) ×3
ELECTRODE REM PT RTRN 9FT ADLT (ELECTROSURGICAL) ×1 IMPLANT
GAUZE SPONGE 4X4 12PLY STRL (GAUZE/BANDAGES/DRESSINGS) IMPLANT
GAUZE SPONGE 4X4 12PLY STRL LF (GAUZE/BANDAGES/DRESSINGS) ×4 IMPLANT
GLOVE BIO SURGEON STRL SZ7.5 (GLOVE) ×6 IMPLANT
HEMOSTAT POWDER SURGIFOAM 1G (HEMOSTASIS) IMPLANT
KIT BASIN OR (CUSTOM PROCEDURE TRAY) ×3 IMPLANT
KIT ROOM TURNOVER OR (KITS) ×3 IMPLANT
NS IRRIG 1000ML POUR BTL (IV SOLUTION) ×3 IMPLANT
PACK CHEST (CUSTOM PROCEDURE TRAY) ×3 IMPLANT
PAD ARMBOARD 7.5X6 YLW CONV (MISCELLANEOUS) ×6 IMPLANT
PAD ELECT DEFIB RADIOL ZOLL (MISCELLANEOUS) ×3 IMPLANT
STRIP CLOSURE SKIN 1/2X4 (GAUZE/BANDAGES/DRESSINGS) ×2 IMPLANT
SUT SILK 2 0 SH CR/8 (SUTURE) ×3 IMPLANT
SUT VIC AB 1 CTX 18 (SUTURE) ×3 IMPLANT
SUT VIC AB 1 CTX 36 (SUTURE) ×3
SUT VIC AB 1 CTX36XBRD ANBCTR (SUTURE) ×1 IMPLANT
SUT VIC AB 3-0 X1 27 (SUTURE) ×3 IMPLANT
SWAB COLLECTION DEVICE MRSA (MISCELLANEOUS) IMPLANT
SWAB CULTURE ESWAB REG 1ML (MISCELLANEOUS) IMPLANT
SYR 10ML LL (SYRINGE) IMPLANT
SYR 50ML SLIP (SYRINGE) IMPLANT
SYSTEM SAHARA CHEST DRAIN ATS (WOUND CARE) IMPLANT
SYSTEM SAHARA CHEST DRAIN RE-I (WOUND CARE) ×4 IMPLANT
TAPE CLOTH SURG 4X10 WHT LF (GAUZE/BANDAGES/DRESSINGS) ×4 IMPLANT
TOWEL GREEN STERILE (TOWEL DISPOSABLE) ×6 IMPLANT
TOWEL OR 17X24 6PK STRL BLUE (TOWEL DISPOSABLE) ×3 IMPLANT
TOWEL OR 17X26 10 PK STRL BLUE (TOWEL DISPOSABLE) ×3 IMPLANT
TRAP SPECIMEN MUCOUS 40CC (MISCELLANEOUS) IMPLANT
TRAY FOLEY SILVER 14FR TEMP (SET/KITS/TRAYS/PACK) ×3 IMPLANT
TRAY FOLEY W/METER SILVER 14FR (SET/KITS/TRAYS/PACK) ×3 IMPLANT
WATER STERILE IRR 1000ML POUR (IV SOLUTION) ×6 IMPLANT

## 2017-01-10 NOTE — Progress Notes (Signed)
  Echocardiogram Echocardiogram Transesophageal has been performed.  Nancy Moore 01/10/2017, 11:13 PM

## 2017-01-10 NOTE — ED Notes (Signed)
Cards at bedside

## 2017-01-10 NOTE — ED Notes (Signed)
Pt assisted to restroom with assistance of Bre, NT.  Pt undressed fully and placed on hospital bed for comfort. Bed was cleaned with purple top wipes prior to linens applied.  Pt was assisted to hospital bed and purewick put into place. Pt's family given recliner for comfort.

## 2017-01-10 NOTE — Anesthesia Procedure Notes (Signed)
Central Venous Catheter Insertion Performed by: Belinda Block, MD, anesthesiologist Start/End12/15/2018 8:55 PM, 01/10/2017 9:10 PM Patient location: OR. Preanesthetic checklist: patient identified, IV checked, site marked, risks and benefits discussed, surgical consent, monitors and equipment checked, pre-op evaluation and timeout performed Position: Trendelenburg Lidocaine 1% used for infiltration and patient sedated Hand hygiene performed  and maximum sterile barriers used  Central line was placed.Double lumen Procedure performed using ultrasound guided technique. Ultrasound Notes:anatomy identified and image(s) printed for medical record Attempts: 1 Following insertion, line sutured, dressing applied and Biopatch. Post procedure assessment: blood return through all ports  Patient tolerated the procedure well with no immediate complications.

## 2017-01-10 NOTE — Anesthesia Procedure Notes (Signed)
Procedure Name: Intubation Date/Time: 01/10/2017 9:40 PM Performed by: Suzy Bouchard, CRNA Pre-anesthesia Checklist: Patient identified, Suction available, Patient being monitored, Timeout performed and Emergency Drugs available Patient Re-evaluated:Patient Re-evaluated prior to induction Oxygen Delivery Method: Circle system utilized Preoxygenation: Pre-oxygenation with 100% oxygen Induction Type: IV induction and Rapid sequence Laryngoscope Size: Miller and 2 Grade View: Grade I Tube type: Subglottic suction tube Tube size: 7.0 mm Number of attempts: 1 Airway Equipment and Method: Stylet Placement Confirmation: ETT inserted through vocal cords under direct vision and positive ETCO2 Secured at: 21 cm Tube secured with: Tape Dental Injury: Teeth and Oropharynx as per pre-operative assessment

## 2017-01-10 NOTE — Progress Notes (Signed)
PROGRESS NOTE [facility]   Taiwan Millon Sadlowski   KZL:[id number] DOB: February 12, 1965  DOA: 01/09/2017 PCP: Darreld Mclean, MD   Brief Narrative:  Nancy Moore is a 51 year old female with medical history significant for stage IV lung cancer, hypertension who presented to the emergency department complaining of severe shortness of breath. She recently had a re-staging CT scan of her chest which showed a large, complex pericardial effusion as well as a large right pleural effusion. An urgent referral to cardiology was done and Dr. Debara Pickett  performed a 2D Echo, which showed massive pericardial effusion with signs of early tamponade. She was sent to ED further evaluation and treatment.  She is admitted for CT surgery evaluation who is recommending pericardial window with chest tube.   Subjective: Patient seen and examined, her shortness of breath has slightly improved but still present requiring oxygen supplementation.  Denies chest pain and palpitations.  She is scheduled for pericardial window with chest tube today.  Assessment & Plan: Pericardial effusion and pleural effusion Likely secondary to malignancy, early signs of tamponade but hemodynamically stable Cardiothoracic surgery was consulted and recommending pericardial window and right chest tube placement.  Continue management per TCTS.   Adenocarcinoma of left lung, stage IV Patient followed by Dr. Earlie Server as an outpatient Continue Afatinib   Hypertension BP stable Continue home medication  Hypokalemia Repleted Check BMP and magnesium in the morning  Acute renal failure Secondary to dehydration and continuation of diuretic Holding diuresis Monitor creatinine closely Gentle hydration preop  DVT prophylaxis: SCDs Code Status: Full code Family Communication: Family at bedside Disposition Plan: TBD   Consultants:   TCTS - Dr Prescott Gum   Procedures:   None  Antimicrobials:  None   Objective: Vitals:   01/10/17 0600 01/10/17 0700 01/10/17 0800 01/10/17 1558  BP: (!) 120/92 108/89 121/89 110/79  Pulse: 94 96 94 96  Resp: 18 19 18 18   Temp:    98.8 F (37.1 C)  TempSrc:    Oral  SpO2: 100% 96% 100% 97%  Weight:      Height:        Intake/Output Summary (Last 24 hours) at 01/10/2017 1816 Last data filed at 01/10/2017 0800 Gross per 24 hour  Intake 50 ml  Output -  Net 50 ml   Filed Weights   01/09/17 1508  Weight: 102.5 kg (226 lb)    Examination:  General exam: Appears calm and comfortable  HEENT: OP moist and clear, no JVD noted  Respiratory system: Decreased breath sounds bilaterally, bibasilar crackles, O2 2L [place]  Cardiovascular system: Distant heart sounds, S1-S2 no murmur appreciated Gastrointestinal system: Abdomen is nondistended, soft and nontender. Normal bowel sounds heard. Central nervous system: Alert and oriented. No focal neurological deficits. Extremities: Bilateral lower extremity edema 1+ pitting Skin: No rashes, lesions or ulcers Psychiatry: Judgement and insight appear normal. Mood & affect appropriate.    Data Reviewed: I have personally reviewed following labs and imaging studies  CBC: Recent Labs  Lab 01/08/17 1055 01/09/17 1510 01/10/17 0333  WBC 10.7* 12.6* 13.2*  NEUTROABS 7.6*  --   --   HGB 10.2* 10.9* 10.7*  HCT 31.8* 34.2* 32.3*  MCV 83.8 83.8 82.6  PLT 268 287 390   Basic Metabolic Panel: Recent Labs  Lab 01/08/17 1055 01/09/17 1510 01/09/17 2221 01/10/17 0333  NA 135* 133*  --  132*  K 3.2* 3.1*  --  3.2*  CL  --  96*  --  95*  CO2 25 23  --  26  GLUCOSE 109 103*  --  101*  BUN 14.6 12  --  14  CREATININE 1.3* 1.23*  --  1.26*  CALCIUM 9.5 9.6  --  9.2  MG  --   --  2.1  --    GFR: Estimated Creatinine Clearance: 64.6 mL/min (A) (by C-G formula based on SCr of 1.26 mg/dL (H)). Liver Function Tests: Recent Labs  Lab 01/08/17 1055 01/10/17 0333  AST 31 33  ALT 34 34  ALKPHOS 82 81  BILITOT 1.74* 1.6*  PROT 7.1  6.8  ALBUMIN 3.6 3.5   No results for input(s): LIPASE, AMYLASE in the last 168 hours. No results for input(s): AMMONIA in the last 168 hours. Coagulation Profile: Recent Labs  Lab 01/09/17 2221 01/10/17 0333  INR 1.49 1.42   Cardiac Enzymes: No results for input(s): CKTOTAL, CKMB, CKMBINDEX, TROPONINI in the last 168 hours. BNP (last 3 results) No results for input(s): PROBNP in the last 8760 hours. HbA1C: No results for input(s): HGBA1C in the last 72 hours. CBG: No results for input(s): GLUCAP in the last 168 hours. Lipid Profile: No results for input(s): CHOL, HDL, LDLCALC, TRIG, CHOLHDL, LDLDIRECT in the last 72 hours. Thyroid Function Tests: No results for input(s): TSH, T4TOTAL, FREET4, T3FREE, THYROIDAB in the last 72 hours. Anemia Panel: No results for input(s): VITAMINB12, FOLATE, FERRITIN, TIBC, IRON, RETICCTPCT in the last 72 hours. Sepsis Labs: Recent Labs  Lab 01/09/17 2221 01/10/17 0333  PROCALCITON <0.10  --   LATICACIDVEN 1.5 1.7    Recent Results (from the past 240 hour(s))  Surgical pcr screen     Status: Abnormal   Collection Time: 01/09/17 11:10 PM  Result Value Ref Range Status   MRSA, PCR NEGATIVE NEGATIVE Final   Staphylococcus aureus POSITIVE (A) NEGATIVE Final    Comment: (NOTE) The Xpert SA Assay (FDA approved for NASAL specimens in patients 63 years of age and older), is one component of a comprehensive surveillance program. It is not intended to diagnose infection nor to guide or monitor treatment.       Radiology Studies: Dg Chest 2 View  Result Date: 01/09/2017 CLINICAL DATA:  Shortness of Breath EXAM: CHEST  2 VIEW COMPARISON:  Chest CT January 08, 2017. Chest radiograph October 21, 2016 FINDINGS: There are fairly small pleural effusions bilaterally. There is patchy airspace opacity in the left upper lobe as well as in both lower lobes. There is mild upper lobe interstitial edema. There is cardiomegaly with suspected pericardial  effusion. Note that there is splaying of the carina. No adenopathy. No bone lesions appreciable. IMPRESSION: Cardiomegaly with suspected pericardial effusion. Pleural effusions bilaterally with areas of patchy airspace opacity, consistent with either pneumonia or alveolar edema. Both entities may be present concurrently. There is mild upper lobe interstitial edema. Electronically Signed   By: Lowella Grip III M.D.   On: 01/09/2017 16:34      Scheduled Meds: . clindamycin   Topical BID  . furosemide  40 mg Oral Daily   Continuous Infusions: . cefUROXime (ZINACEF)  IV    . dextrose 5 % and 0.45% NaCl 50 mL/hr at 01/10/17 0542     LOS: 1 day    Time spent: Total of 25 minutes spent with pt, greater than 50% of which was spent in discussion of  treatment, counseling and coordination of care   Chipper Oman, MD Pager: Text Page via [email or web address]   If 7PM-7AM, please contact night-coverage [email or web address]  01/10/2017, 6:16 PM

## 2017-01-10 NOTE — Progress Notes (Signed)
Patient prepared for OR. Transferred to OR via stretcher. Purewick cath removed and family at bedside. Permit signed and placed in chart. Patient didn't have questions and was in a positive mood during transport. O2 at 2L via [place]. VSS,  D51/2NS at 26ml/hr infusing. Patient NPO after few sips of liquid to take medication at 1600. Pastor at bedside before transport to OR.

## 2017-01-10 NOTE — Progress Notes (Signed)
  Subjective: Patient examined, echocardiogram and CT scan of chest images personally reviewed and counseled with patient 51 year old obese AA female with stage IV non-small cell carcinoma the lung being treated by Dr. Julien Nordmann with chemotherapy. She has had progressive shortness of breath and a CT scan was performed showing a moderate right pleural effusion and no pulmonary embolus. However she was noted to have a pericardial effusion and this was confirmed by transthoracic echocardiogram performed in the cardiology office mid day on December 14. She was sent to the hospital for treatment of the pericardial effusion and was admitted. I was notified at 10 PM about the patient's present in the ED. Echo showed physiology and the patient's vital signs are stable.  I've discussed the procedure of subxiphoid pericardial window and right chest tube placement with the patient. The patient was made nothing by mouth and she will be placed on IV fluids until the procedures performed later today  Objective: Vital signs in last 24 hours: Temp:  [97.9 F (36.6 C)-98.7 F (37.1 C)] 97.9 F (36.6 C) (12/14 1954) Pulse Rate:  [84-97] 95 (12/15 0330) Resp:  [12-22] 19 (12/15 0330) BP: (100-117)/(67-93) 116/76 (12/15 0115) SpO2:  [96 %-100 %] 100 % (12/15 0330) Weight:  [226 lb (102.5 kg)] 226 lb (102.5 kg) (12/14 1508)  Hemodynamic parameters for last 24 hours:    Intake/Output from previous day: No intake/output data recorded. Intake/Output this shift: No intake/output data recorded.       Exam    General- alert and comfortable, obese middle-aged AA female no acute distress   Lungs-diminished breath sounds right base butwithout rales, wheezes   Cor- regular rate and rhythm, no murmur , gallop, no rub   Abdomen- soft, non-tender, skin fold yeast infection in the lower abdominal pannus   Extremities - warm, non-tender, significant bilateral edema to the pretibial area    Neuro- oriented,  appropriate, no focal weakness   Lab Results: Recent Labs    01/08/17 1055 01/09/17 1510  WBC 10.7* 12.6*  HGB 10.2* 10.9*  HCT 31.8* 34.2*  PLT 268 287   BMET:  Recent Labs    01/08/17 1055 01/09/17 1510  NA 135* 133*  K 3.2* 3.1*  CL  --  96*  CO2 25 23  GLUCOSE 109 103*  BUN 14.6 12  CREATININE 1.3* 1.23*  CALCIUM 9.5 9.6    PT/INR:  Recent Labs    01/09/17 2221  LABPROT 17.9*  INR 1.49   ABG    Component Value Date/Time   PHART 7.517 (H) 01/10/2017 0234   HCO3 23.3 01/10/2017 0234   TCO2 24 01/10/2017 0234   O2SAT 99.0 01/10/2017 0234   CBG (last 3)  No results for input(s): GLUCAP in the last 72 hours.  Assessment/Plan: S/P  Patient receiving oral chemotherapy for advanced stage lung cancer now with large pericardial effusion which is symptomatic with shortness of breath. She is not in. She Will Be Scheduled for Urgent Pericardial Window and Right Chest Tube Placement for Drainage of Pleural Effusion Today.   LOS: 1 day    Tharon Aquas Trigt III 01/10/2017

## 2017-01-10 NOTE — Transfer of Care (Signed)
Immediate Anesthesia Transfer of Care Note  Patient: Nancy Moore  Procedure(s) Performed: PERICARDIAL WINDOW- SUB XYPHOID, RIGHT CHEST TUBE (N/A )  Patient Location: PACU  Anesthesia Type:General  Level of Consciousness: awake and alert   Airway & Oxygen Therapy: Patient Spontanous Breathing and Patient connected to nasal cannula oxygen  Post-op Assessment: Report given to RN and Post -op Vital signs reviewed and stable  Post vital signs: Reviewed and stable  Last Vitals:  Vitals:   01/10/17 2256 01/10/17 2300  BP: (!) 103/46   Pulse: 90 91  Resp: 17 17  Temp: (!) 36.3 C   SpO2: 100% 100%    Last Pain:  Vitals:   01/10/17 2000  TempSrc:   PainSc: 0-No pain         Complications: No apparent anesthesia complications

## 2017-01-10 NOTE — Anesthesia Preprocedure Evaluation (Addendum)
Anesthesia Evaluation  Patient identified by MRN, date of birth, ID band Patient awake    Reviewed: Allergy & Precautions, NPO status , Patient's Chart, lab work & pertinent test results  Airway Mallampati: II  TM Distance: >3 FB     Dental  (+) Dental Advisory Given, Teeth Intact   Pulmonary shortness of breath,    breath sounds clear to auscultation       Cardiovascular hypertension,  Rhythm:Regular Rate:Normal     Neuro/Psych    GI/Hepatic negative GI ROS, Neg liver ROS,   Endo/Other    Renal/GU Renal disease     Musculoskeletal   Abdominal   Peds  Hematology   Anesthesia Other Findings   Reproductive/Obstetrics                            Anesthesia Physical Anesthesia Plan  ASA: III  Anesthesia Plan: General   Post-op Pain Management:    Induction: Intravenous  PONV Risk Score and Plan: 3 and Treatment may vary due to age or medical condition  Airway Management Planned: Oral ETT  Additional Equipment: Arterial line, CVP and TEE  Intra-op Plan:   Post-operative Plan: Possible Post-op intubation/ventilation  Informed Consent: I have reviewed the patients History and Physical, chart, labs and discussed the procedure including the risks, benefits and alternatives for the proposed anesthesia with the patient or authorized representative who has indicated his/her understanding and acceptance.   Dental advisory given  Plan Discussed with: CRNA, Anesthesiologist and Surgeon  Anesthesia Plan Comments:        Anesthesia Quick Evaluation

## 2017-01-10 NOTE — Brief Op Note (Signed)
01/09/2017 - 01/10/2017  10:37 PM  PATIENT:  Nancy Moore  51 y.o. female  PRE-OPERATIVE DIAGNOSIS:  1. LARGE PERICARDIAL EFFUSION 2. RIGHT PLEURAL EFFUSION  POST-OPERATIVE DIAGNOSIS: 1. LARGE PERICARDIAL EFFUSION 2. RIGHT PLEURAL EFFUSION  PROCEDURE:  URGENT SUBXIPHOID PERICARDIAL WINDOW, RIGHT CHEST TUBE   FINDINGS: Approximately 1250 cc of hemorrhagic fluid removed from the pericardial space and light yellow fluid removed from the right pleural space  SURGEON:  Surgeon(s) and Role:    *Ivin Poot, MD - Primary  PHYSICIAN ASSISTANT: Lars Pinks PA-C  ANESTHESIA:   general  EBL:  Less than 50 cc  BLOOD ADMINISTERED:none  DRAINS: 28 Blake placed in the pericardial space and a 28 French chest tube placed in the right pleural space   SPECIMEN:  Source of Specimen:  Pericaridal fluid, pericardial biopsy, and right pleural fluid  DISPOSITION OF SPECIMEN:  Pathology and culture  COUNTS CORRECT:  YES  DICTATION: .Dragon Dictation  PLAN OF CARE: Admit to inpatient   PATIENT DISPOSITION:  PACU - hemodynamically stable.   Delay start of Pharmacological VTE agent (>24hrs) due to surgical blood loss or risk of bleeding: yes

## 2017-01-10 NOTE — Anesthesia Procedure Notes (Signed)
Arterial Line Insertion Start/End12/15/2018 9:00 PM, 01/10/2017 9:10 PM Performed by: Suzy Bouchard, CRNA, CRNA  Patient location: OOR procedure area. Lidocaine 1% used for infiltration radial was placed Catheter size: 20 G Hand hygiene performed  and maximum sterile barriers used  Allen's test indicative of satisfactory collateral circulation Attempts: 2 Procedure performed without using ultrasound guided technique. Following insertion, dressing applied and Biopatch. Post procedure assessment: normal  Patient tolerated the procedure well with no immediate complications.

## 2017-01-10 NOTE — ED Notes (Signed)
QNS unsucssesful blood draw,  Notified nurse and will have other phleb to draw labs.

## 2017-01-11 ENCOUNTER — Inpatient Hospital Stay (HOSPITAL_COMMUNITY): Payer: 59

## 2017-01-11 ENCOUNTER — Encounter (HOSPITAL_COMMUNITY): Payer: Self-pay | Admitting: Cardiothoracic Surgery

## 2017-01-11 LAB — BASIC METABOLIC PANEL
Anion gap: 6 (ref 5–15)
BUN: 8 mg/dL (ref 6–20)
CO2: 29 mmol/L (ref 22–32)
Calcium: 8.5 mg/dL — ABNORMAL LOW (ref 8.9–10.3)
Chloride: 101 mmol/L (ref 101–111)
Creatinine, Ser: 0.98 mg/dL (ref 0.44–1.00)
GFR calc Af Amer: >60 mL/min (ref >60)
GFR calc non Af Amer: >60 mL/min (ref >60)
Glucose, Bld: 123 mg/dL — ABNORMAL HIGH (ref 65–99)
Potassium: 3.7 mmol/L (ref 3.5–5.1)
Sodium: 136 mmol/L (ref 135–145)

## 2017-01-11 LAB — CBC
HCT: 29.6 % — ABNORMAL LOW (ref 36.0–46.0)
Hemoglobin: 9.2 g/dL — ABNORMAL LOW (ref 12.0–15.0)
MCH: 25.9 pg — ABNORMAL LOW (ref 26.0–34.0)
MCHC: 31.1 g/dL (ref 30.0–36.0)
MCV: 83.4 fL (ref 78.0–100.0)
Platelets: 252 10*3/uL (ref 150–400)
RBC: 3.55 MIL/uL — ABNORMAL LOW (ref 3.87–5.11)
RDW: 16.4 % — ABNORMAL HIGH (ref 11.5–15.5)
WBC: 10.9 10*3/uL — ABNORMAL HIGH (ref 4.0–10.5)

## 2017-01-11 LAB — BLOOD GAS, ARTERIAL
Acid-Base Excess: 2.9 mmol/L — ABNORMAL HIGH (ref 0.0–2.0)
Bicarbonate: 27.3 mmol/L (ref 20.0–28.0)
O2 Content: 2 L/min
O2 Saturation: 97 %
Patient temperature: 98.6
pCO2 arterial: 45.1 mmHg (ref 32.0–48.0)
pH, Arterial: 7.399 (ref 7.350–7.450)
pO2, Arterial: 95.2 mmHg (ref 83.0–108.0)

## 2017-01-11 LAB — ECHO INTRAOPERATIVE TEE
Height: 66 in
Weight: 3616 oz

## 2017-01-11 LAB — GLUCOSE, CAPILLARY: Glucose-Capillary: 125 mg/dL — ABNORMAL HIGH (ref 65–99)

## 2017-01-11 LAB — MAGNESIUM: Magnesium: 1.6 mg/dL — ABNORMAL LOW (ref 1.7–2.4)

## 2017-01-11 LAB — ALBUMIN: Albumin: 3 g/dL — ABNORMAL LOW (ref 3.5–5.0)

## 2017-01-11 MED ORDER — BISACODYL 5 MG PO TBEC
10.0000 mg | DELAYED_RELEASE_TABLET | Freq: Every day | ORAL | Status: DC
  Administered 2017-01-12 – 2017-01-13 (×2): 10 mg via ORAL
  Filled 2017-01-11 (×6): qty 2

## 2017-01-11 MED ORDER — SENNOSIDES-DOCUSATE SODIUM 8.6-50 MG PO TABS
1.0000 | ORAL_TABLET | Freq: Every day | ORAL | Status: DC
  Administered 2017-01-11 – 2017-01-19 (×7): 1 via ORAL
  Filled 2017-01-11 (×8): qty 1

## 2017-01-11 MED ORDER — ACETAMINOPHEN 500 MG PO TABS
1000.0000 mg | ORAL_TABLET | Freq: Four times a day (QID) | ORAL | Status: DC
  Administered 2017-01-11 – 2017-01-15 (×17): 1000 mg via ORAL
  Filled 2017-01-11 (×17): qty 2

## 2017-01-11 MED ORDER — ACETAMINOPHEN 160 MG/5ML PO SOLN
1000.0000 mg | Freq: Four times a day (QID) | ORAL | Status: DC
  Filled 2017-01-11 (×2): qty 40.6

## 2017-01-11 MED ORDER — PANTOPRAZOLE SODIUM 40 MG PO TBEC
40.0000 mg | DELAYED_RELEASE_TABLET | Freq: Every day | ORAL | Status: DC
  Administered 2017-01-11 – 2017-01-22 (×12): 40 mg via ORAL
  Filled 2017-01-11 (×12): qty 1

## 2017-01-11 MED ORDER — POTASSIUM CHLORIDE 10 MEQ/50ML IV SOLN
10.0000 meq | Freq: Every day | INTRAVENOUS | Status: DC | PRN
  Administered 2017-01-14 (×3): 10 meq via INTRAVENOUS
  Filled 2017-01-11 (×3): qty 50

## 2017-01-11 MED ORDER — FUROSEMIDE 40 MG PO TABS
40.0000 mg | ORAL_TABLET | Freq: Every day | ORAL | Status: DC
  Administered 2017-01-11 – 2017-01-22 (×12): 40 mg via ORAL
  Filled 2017-01-11 (×12): qty 1

## 2017-01-11 MED ORDER — INSULIN ASPART 100 UNIT/ML ~~LOC~~ SOLN: 0.0000 [IU] | Freq: Four times a day (QID) | SUBCUTANEOUS | Status: DC

## 2017-01-11 MED ORDER — SODIUM CHLORIDE 0.9 % IV SOLN
INTRAVENOUS | Status: DC
  Administered 2017-01-13 – 2017-01-17 (×5): via INTRAVENOUS

## 2017-01-11 MED ORDER — ONDANSETRON HCL 4 MG/2ML IJ SOLN: 4.0000 mg | Freq: Four times a day (QID) | INTRAMUSCULAR | Status: DC | PRN

## 2017-01-11 MED ORDER — SODIUM CHLORIDE 0.9 % IV BOLUS (SEPSIS): 500.0000 mL | Freq: Once | INTRAVENOUS | Status: DC

## 2017-01-11 MED ORDER — POTASSIUM CHLORIDE CRYS ER 10 MEQ PO TBCR
30.0000 meq | EXTENDED_RELEASE_TABLET | Freq: Once | ORAL | Status: AC
  Administered 2017-01-11: 30 meq via ORAL
  Filled 2017-01-11: qty 1

## 2017-01-11 MED ORDER — DEXTROSE 5 % IV SOLN
1.5000 g | Freq: Two times a day (BID) | INTRAVENOUS | Status: AC
  Administered 2017-01-11 (×2): 1.5 g via INTRAVENOUS
  Filled 2017-01-11 (×2): qty 1.5

## 2017-01-11 NOTE — Progress Notes (Addendum)
      [address]       [place] [address]             [phone]       1 Day Post-Op Procedure(s) (LRB): PERICARDIAL WINDOW- SUB XYPHOID, RIGHT CHEST TUBE (N/A)  Subjective: Patient somewhat sleepy this am.  Objective: Vital signs in last 24 hours: Temp:  [97.4 F (36.3 C)-98.8 F (37.1 C)] 97.7 F (36.5 C) (12/15 2347) Pulse Rate:  [86-101] 86 (12/16 0400) Cardiac Rhythm: Normal sinus rhythm (12/16 0600) Resp:  [10-18] 13 (12/16 0400) BP: (96-112)/(46-90) 101/74 (12/16 0400) SpO2:  [96 %-100 %] 98 % (12/16 0400) Arterial Line BP: (86-114)/(54-91) 102/61 (12/16 0400)      Intake/Output from previous day: 12/15 0701 - 12/16 0700 In: 2700 [I.V.:2150; IV Piggyback:550] Out: 1815 [Urine:450; Blood:25; Chest Tube:1340]   Physical Exam:  Cardiovascular: RRR Pulmonary: Slightly diminished at bases Extremities: Mild bilateral lower extremity edema. Wounds: Subxiphoid wound is clean and dry.  No erythema or signs of infection. Right chest tube dressing with sero sanguinous drainage. Right chest Tube: to suction, no air leak Pericardial Tube: to suction  Lab Results: CBC: Recent Labs    01/10/17 0333 01/11/17 0425  WBC 13.2* 10.9*  HGB 10.7* 9.2*  HCT 32.3* 29.6*  PLT 293 252   BMET:  Recent Labs    01/10/17 0333 01/11/17 0425  NA 132* 136  K 3.2* 3.7  CL 95* 101  CO2 26 29  GLUCOSE 101* 123*  BUN 14 8  CREATININE 1.26* 0.98  CALCIUM 9.2 8.5*    PT/INR:  Recent Labs    01/10/17 0333  LABPROT 17.2*  INR 1.42   ABG:  INR: Will add last result for INR, ABG once components are confirmed Will add last 4 CBG results once components are confirmed  Assessment/Plan:  1. CV - SR. 2.  Pulmonary - On 2 liters via [place] (has PCA). I marked Pleura Vac for right chest tube and pericardial drain in order for accurate output. 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 Both tubes to remain to suction for now.  Await pathology from pericardial and pleural drainage. 3. Supplement potassium 4. Remove a line 5. Decrease IVF 6. GI-tolerating diet. She does not have a history of diabetes so will stop accu checks and SS PRN. 7. Management per primary  Donielle M ZimmermanPA-C 01/11/2017,8:59 AM   cxr improved Leave both drains today patient examined and medical record reviewed,agree with above note. Tharon Aquas Trigt III 01/11/2017

## 2017-01-11 NOTE — Progress Notes (Signed)
PROGRESS NOTE [facility]   Nancy Moore   GBT:[id number] DOB: 1965-09-27  DOA: 01/09/2017 PCP: Darreld Mclean, MD   Brief Narrative:  Nancy Moore is a 51 year old female with medical history significant for stage IV lung cancer, hypertension who presented to the emergency department complaining of severe shortness of breath. She recently had a re-staging CT scan of her chest which showed a large, complex pericardial effusion as well as a large right pleural effusion. An urgent referral to cardiology was done and Dr. Debara Pickett  performed a 2D Echo, which showed massive pericardial effusion with signs of early tamponade. She was sent to ED further evaluation and treatment.  She is admitted pericardial effusion with early tamponade underwent pericardial window with R chest tube placement.   Subjective: Patient seen and examined, she is s/p pericardial window and R chest tube placement. Pain well controlled with PCA pump. Increase in leg swelling. No SOB slight improve. Chest discomfort from surgical procedure. No acute events.   Assessment & Plan: Pericardial effusion and pleural effusion Likely secondary to malignancy,  S/p pericardial window and R chest tube - high output from both drains  Continue management per TCTS    Adenocarcinoma of left lung, stage IV Patient followed by Dr. Earlie Server as an outpatient Continue Afatinib   Hypertension BP stable A line in place  Continue current regimen   Hypokalemia - resolved  Repleted Mag pending   Acute renal failure - resolved  Secondary to dehydration and continuation of diuretic Now with low extremity edema  Check albumin Resume home dose Lasix   DVT prophylaxis: SCDs Code Status: Full code Family Communication: Family at bedside Disposition Plan: TBD   Consultants:   TCTS - Dr Prescott Gum   Procedures:   None  Antimicrobials:  None   Objective: Vitals:   01/11/17 0024 01/11/17 0048 01/11/17 0215 01/11/17  0400  BP: 110/75 107/81 102/72 101/74  Pulse: 96 91 87 86  Resp:  12 10 13   Temp:      TempSrc:      SpO2: 99% 100% 99% 98%  Weight:      Height:        Intake/Output Summary (Last 24 hours) at 01/11/2017 0858 Last data filed at 01/11/2017 0600 Gross per 24 hour  Intake 2650 ml  Output 1815 ml  Net 835 ml   Filed Weights   01/09/17 1508  Weight: 102.5 kg (226 lb)    Examination:  General: Sleepy but responsive  Cardiovascular: S1S2 RRR, mid line scar, hypoxiphoid tube in place  Respiratory: Air entry diminishes, on O2, R chest tube in place  Abdominal: Soft, NT, ND, bowel sounds + Extremities: b/l LE 1-2 + pitting edema.   Data Reviewed: I have personally reviewed following labs and imaging studies  CBC: Recent Labs  Lab 01/08/17 1055 01/09/17 1510 01/10/17 0333 01/11/17 0425  WBC 10.7* 12.6* 13.2* 10.9*  NEUTROABS 7.6*  --   --   --   HGB 10.2* 10.9* 10.7* 9.2*  HCT 31.8* 34.2* 32.3* 29.6*  MCV 83.8 83.8 82.6 83.4  PLT 268 287 293 710   Basic Metabolic Panel: Recent Labs  Lab 01/08/17 1055 01/09/17 1510 01/09/17 2221 01/10/17 0333 01/11/17 0425  NA 135* 133*  --  132* 136  K 3.2* 3.1*  --  3.2* 3.7  CL  --  96*  --  95* 101  CO2 25 23  --  26 29  GLUCOSE 109 103*  --  101*  123*  BUN 14.6 12  --  14 8  CREATININE 1.3* 1.23*  --  1.26* 0.98  CALCIUM 9.5 9.6  --  9.2 8.5*  MG  --   --  2.1  --   --    GFR: Estimated Creatinine Clearance: 83 mL/min (by C-G formula based on SCr of 0.98 mg/dL). Liver Function Tests: Recent Labs  Lab 01/08/17 1055 01/10/17 0333  AST 31 33  ALT 34 34  ALKPHOS 82 81  BILITOT 1.74* 1.6*  PROT 7.1 6.8  ALBUMIN 3.6 3.5   No results for input(s): LIPASE, AMYLASE in the last 168 hours. No results for input(s): AMMONIA in the last 168 hours. Coagulation Profile: Recent Labs  Lab 01/09/17 2221 01/10/17 0333  INR 1.49 1.42   Cardiac Enzymes: No results for input(s): CKTOTAL, CKMB, CKMBINDEX, TROPONINI in the  last 168 hours. BNP (last 3 results) No results for input(s): PROBNP in the last 8760 hours. HbA1C: No results for input(s): HGBA1C in the last 72 hours. CBG: Recent Labs  Lab 01/11/17 0755  GLUCAP 125*   Lipid Profile: No results for input(s): CHOL, HDL, LDLCALC, TRIG, CHOLHDL, LDLDIRECT in the last 72 hours. Thyroid Function Tests: No results for input(s): TSH, T4TOTAL, FREET4, T3FREE, THYROIDAB in the last 72 hours. Anemia Panel: No results for input(s): VITAMINB12, FOLATE, FERRITIN, TIBC, IRON, RETICCTPCT in the last 72 hours. Sepsis Labs: Recent Labs  Lab 01/09/17 2221 01/10/17 0333  PROCALCITON <0.10  --   LATICACIDVEN 1.5 1.7    Recent Results (from the past 240 hour(s))  Surgical pcr screen     Status: Abnormal   Collection Time: 01/09/17 11:10 PM  Result Value Ref Range Status   MRSA, PCR NEGATIVE NEGATIVE Final   Staphylococcus aureus POSITIVE (A) NEGATIVE Final    Comment: (NOTE) The Xpert SA Assay (FDA approved for NASAL specimens in patients 63 years of age and older), is one component of a comprehensive surveillance program. It is not intended to diagnose infection nor to guide or monitor treatment.       Radiology Studies: Dg Chest 2 View  Result Date: 01/09/2017 CLINICAL DATA:  Shortness of Breath EXAM: CHEST  2 VIEW COMPARISON:  Chest CT January 08, 2017. Chest radiograph October 21, 2016 FINDINGS: There are fairly small pleural effusions bilaterally. There is patchy airspace opacity in the left upper lobe as well as in both lower lobes. There is mild upper lobe interstitial edema. There is cardiomegaly with suspected pericardial effusion. Note that there is splaying of the carina. No adenopathy. No bone lesions appreciable. IMPRESSION: Cardiomegaly with suspected pericardial effusion. Pleural effusions bilaterally with areas of patchy airspace opacity, consistent with either pneumonia or alveolar edema. Both entities may be present concurrently. There  is mild upper lobe interstitial edema. Electronically Signed   By: Lowella Grip III M.D.   On: 01/09/2017 16:34   Dg Chest Port 1 View  Result Date: 01/11/2017 CLINICAL DATA:  51 year old female Postoperative day 1 status post pericardial window and right chest tube placed. stage IV lung cancer. Large pericardial effusion. Bilateral pleural effusions. EXAM: PORTABLE CHEST 1 VIEW COMPARISON:  01/10/2017 and earlier. FINDINGS: Portable AP semi upright view at 0654 hours. Stable right chest tube. Stable pericardial drain. Stable right IJ central line. Small medial right lung apex pneumothorax is now visible. No residual right pleural effusion is evident. Dense left lung base opacity persists obscuring the left hemidiaphragm. Patchy superimposed left mid lung opacity has progressed since 01/09/2017. Stable cardiac size  and mediastinal contours. IMPRESSION: 1. Stable right chest tube. Small medial right apical pneumothorax is now visible. 2. Stable cardiac silhouette. Continued left lower lobe collapse or consolidation. 3. Increasing Patchy and confluent left perihilar opacity. 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 Electronically Signed   By: Genevie Ann M.D.   On: 01/11/2017 08:43   Dg Chest Port 1 View  Result Date: 01/11/2017 CLINICAL DATA:  Postoperative radiograph. Assess for pneumothorax. Patient has chest tubes in place. EXAM: PORTABLE CHEST 1 VIEW COMPARISON:  Chest radiograph performed 01/09/2017 FINDINGS: Bilateral chest tubes are noted. There is a residual small left pleural effusion. No significant pneumothorax is seen. Left perihilar airspace opacity may reflect atelectasis or possibly pneumonia. Underlying vascular congestion is noted. The cardiomediastinal silhouette is borderline enlarged. A right IJ line is noted ending about the distal SVC. No acute osseous abnormalities are seen. IMPRESSION: 1. Residual small left  pleural effusion. No significant pneumothorax seen. 2. Vascular congestion and borderline cardiomegaly. Left perihilar airspace opacity may reflect atelectasis or possibly pneumonia. Electronically Signed   By: Garald Balding M.D.   On: 01/11/2017 00:27      Scheduled Meds: . acetaminophen  1,000 mg Oral Q6H   Or  . acetaminophen (TYLENOL) oral liquid 160 mg/5 mL  1,000 mg Oral Q6H  . bisacodyl  10 mg Oral Daily  . clindamycin   Topical BID  . fentaNYL      . fentaNYL      . fentaNYL   Intravenous Q4H  . furosemide  40 mg Oral Daily  . insulin aspart  0-24 Units Subcutaneous Q6H  . pantoprazole  40 mg Oral Q1200  . senna-docusate  1 tablet Oral QHS   Continuous Infusions: . cefUROXime (ZINACEF)  IV    . potassium chloride       LOS: 2 days    Time spent: Total of 25 minutes spent with pt, greater than 50% of which was spent in discussion of  treatment, counseling and coordination of care   Chipper Oman, MD Pager: Text Page via [email or web address]   If 7PM-7AM, please contact night-coverage [email or web address] 01/11/2017, 8:58 AM

## 2017-01-11 NOTE — Addendum Note (Signed)
Addendum  created 01/11/17 0006 by Belinda Block, MD   Diagnosis association updated

## 2017-01-11 NOTE — Anesthesia Postprocedure Evaluation (Signed)
Anesthesia Post Note  Patient: Nancy Moore  Procedure(s) Performed: PERICARDIAL WINDOW- SUB XYPHOID, RIGHT CHEST TUBE (N/A )     Patient location during evaluation: PACU Anesthesia Type: General Level of consciousness: awake Pain management: pain level controlled Vital Signs Assessment: post-procedure vital signs reviewed and stable Respiratory status: spontaneous breathing Cardiovascular status: stable Anesthetic complications: no    Last Vitals:  Vitals:   01/10/17 2345 01/10/17 2347  BP: 104/72 104/72  Pulse: 89 90  Resp: 12 11  Temp:  36.5 C  SpO2: 99% 99%    Last Pain:  Vitals:   01/10/17 2347  TempSrc:   PainSc: 7                  Jessieca Rhem

## 2017-01-12 ENCOUNTER — Telehealth: Payer: Self-pay | Admitting: Medical Oncology

## 2017-01-12 ENCOUNTER — Inpatient Hospital Stay (HOSPITAL_COMMUNITY): Payer: 59

## 2017-01-12 LAB — CBC
HCT: 29.8 % — ABNORMAL LOW (ref 36.0–46.0)
Hemoglobin: 9.3 g/dL — ABNORMAL LOW (ref 12.0–15.0)
MCH: 25.9 pg — ABNORMAL LOW (ref 26.0–34.0)
MCHC: 31.2 g/dL (ref 30.0–36.0)
MCV: 83 fL (ref 78.0–100.0)
Platelets: 248 10*3/uL (ref 150–400)
RBC: 3.59 MIL/uL — ABNORMAL LOW (ref 3.87–5.11)
RDW: 16.3 % — ABNORMAL HIGH (ref 11.5–15.5)
WBC: 18.8 10*3/uL — ABNORMAL HIGH (ref 4.0–10.5)

## 2017-01-12 LAB — DIFFERENTIAL
Basophils Absolute: 0 K/uL (ref 0.0–0.1)
Basophils Relative: 0 %
Eosinophils Absolute: 0.1 K/uL (ref 0.0–0.7)
Eosinophils Relative: 0 %
Lymphocytes Relative: 7 %
Lymphs Abs: 1.2 K/uL (ref 0.7–4.0)
Monocytes Absolute: 2.7 K/uL — ABNORMAL HIGH (ref 0.1–1.0)
Monocytes Relative: 15 %
Neutro Abs: 14.7 K/uL — ABNORMAL HIGH (ref 1.7–7.7)
Neutrophils Relative %: 78 %

## 2017-01-12 LAB — COMPREHENSIVE METABOLIC PANEL
ALT: 22 U/L (ref 14–54)
AST: 23 U/L (ref 15–41)
Albumin: 2.6 g/dL — ABNORMAL LOW (ref 3.5–5.0)
Alkaline Phosphatase: 57 U/L (ref 38–126)
Anion gap: 6 (ref 5–15)
BUN: 13 mg/dL (ref 6–20)
CO2: 28 mmol/L (ref 22–32)
Calcium: 8.6 mg/dL — ABNORMAL LOW (ref 8.9–10.3)
Chloride: 99 mmol/L — ABNORMAL LOW (ref 101–111)
Creatinine, Ser: 1.13 mg/dL — ABNORMAL HIGH (ref 0.44–1.00)
GFR calc Af Amer: >60 mL/min (ref >60)
GFR calc non Af Amer: 56 mL/min — ABNORMAL LOW (ref >60)
Glucose, Bld: 128 mg/dL — ABNORMAL HIGH (ref 65–99)
Potassium: 3.7 mmol/L (ref 3.5–5.1)
Sodium: 133 mmol/L — ABNORMAL LOW (ref 135–145)
Total Bilirubin: 1.3 mg/dL — ABNORMAL HIGH (ref 0.3–1.2)
Total Protein: 5.2 g/dL — ABNORMAL LOW (ref 6.5–8.1)

## 2017-01-12 MED ORDER — AMOXICILLIN-POT CLAVULANATE 875-125 MG PO TABS
1.0000 | ORAL_TABLET | Freq: Two times a day (BID) | ORAL | Status: DC
  Administered 2017-01-12 (×2): 1 via ORAL
  Filled 2017-01-12 (×3): qty 1

## 2017-01-12 MED ORDER — BOOST / RESOURCE BREEZE PO LIQD CUSTOM
1.0000 | Freq: Three times a day (TID) | ORAL | Status: DC
  Administered 2017-01-12 – 2017-01-22 (×25): 1 via ORAL

## 2017-01-12 MED ORDER — MUPIROCIN 2 % EX OINT
TOPICAL_OINTMENT | Freq: Two times a day (BID) | CUTANEOUS | Status: DC
  Administered 2017-01-12 (×2): via NASAL
  Administered 2017-01-13: 1 via NASAL
  Administered 2017-01-13 – 2017-01-22 (×18): via NASAL
  Filled 2017-01-12: qty 22

## 2017-01-12 MED ORDER — MAGNESIUM SULFATE 2 GM/50ML IV SOLN
2.0000 g | Freq: Once | INTRAVENOUS | Status: AC
  Administered 2017-01-12: 2 g via INTRAVENOUS
  Filled 2017-01-12: qty 50

## 2017-01-12 NOTE — Progress Notes (Signed)
Initial Nutrition Assessment  DOCUMENTATION CODES:   Obesity unspecified  INTERVENTION:    Boost Breeze po TID, each supplement provides 250 kcal and 9 grams of protein  NUTRITION DIAGNOSIS:   Increased nutrient needs related to cancer and cancer related treatments as evidenced by estimated needs  GOAL:   Patient will meet greater than or equal to 90% of their needs  MONITOR:   PO intake, Supplement acceptance, Labs, Weight trends, Skin  REASON FOR ASSESSMENT:   Malnutrition Screening Tool  ASSESSMENT:   51 yo Female with PMH significant for stage IV lung cancer, hypertension who presented to the emergency department complaining of severe shortness of breath. She recently had a re-staging CT scan of her chest which showed a large, complex pericardial effusion as well as a large R pleural effusion.   Pt s/p procedure 12/15: PERICARDIAL WINDOW- SUB XYPHOID, RIGHT CHEST TUBE   RD spoke with pt at bedside. Reports a fair appetite. She didn't particularly like her breakfast (oatmeal was watery). At home pt typically consumes meals and snacks. Likes yogurt, applesauce and grapes.  Per readings below, pt has had a 6% weight loss in approximately 8 weeks. Not significant for time frame. Pt also reveals she vomits after drinking Ensure and/or Boost supplements. Amenable to trying Boost Breeze. Labs and medications reviewed. Na 133 (L). CBG 125.  NUTRITION - FOCUSED PHYSICAL EXAM:    Most Recent Value  Orbital Region  Unable to assess  Upper Arm Region  Unable to assess  Thoracic and Lumbar Region  Unable to assess  Buccal Region  Unable to assess  Temple Region  Unable to assess  Clavicle Bone Region  Unable to assess  Clavicle and Acromion Bone Region  Unable to assess  Scapular Bone Region  Unable to assess  Dorsal Hand  Unable to assess  Patellar Region  Unable to assess  Anterior Thigh Region  Unable to assess  Posterior Calf Region  Unable to assess  Edema (RD  Assessment)  Unable to assess  Deferred exam; several (4 or 5) family members present at time of visit    Diet Order:  Diet Heart Room service appropriate? Yes; Fluid consistency: Thin  EDUCATION NEEDS:   No education needs have been identified at this time  Skin:  Skin Assessment: Reviewed RN Assessment  Last BM:  12/15  Height:   Ht Readings from Last 1 Encounters:  01/09/17 5\' 6"  (1.676 m)   Weight:   Wt Readings from Last 1 Encounters:  01/09/17 226 lb (102.5 kg)   Wt Readings from Last 10 Encounters:  01/09/17 226 lb (102.5 kg)  01/09/17 226 lb (102.5 kg)  12/24/16 232 lb 3.2 oz (105.3 kg)  12/17/16 233 lb (105.7 kg)  12/05/16 236 lb 1.6 oz (107.1 kg)  11/20/16 241 lb 6.4 oz (109.5 kg)  11/06/16 242 lb 4.8 oz (109.9 kg)  10/28/16 233 lb 14.4 oz (106.1 kg)  10/15/16 233 lb (105.7 kg)  11/27/12 215 lb 1.6 oz (97.6 kg)   Ideal Body Weight:  59 kg  BMI:  Body mass index is 36.48 kg/m.  Estimated Nutritional Needs:   Kcal:  2100-2300  Protein:  100-115 gm  Fluid:  2.1-2.3 L   Arthur Holms, RD, LDN Pager #: [phone] After-Hours Pager #: [phone]

## 2017-01-12 NOTE — Progress Notes (Addendum)
[address]       [place] [address]             [id number]      2 Days Post-Op Procedure(s) (LRB): PERICARDIAL WINDOW- SUB XYPHOID, RIGHT CHEST TUBE (N/A) Subjective: Feels ok, not SOB currently  Objective: Vital signs in last 24 hours: Temp:  [97.3 F (36.3 C)-97.7 F (36.5 C)] 97.7 F (36.5 C) (12/17 0405) Pulse Rate:  [74-97] 80 (12/17 0800) Cardiac Rhythm: Normal sinus rhythm (12/17 0821) Resp:  [10-22] 21 (12/17 0800) BP: (90-107)/(43-78) 106/67 (12/17 0800) SpO2:  [96 %-100 %] 99 % (12/17 0800)  Hemodynamic parameters for last 24 hours:    Intake/Output from previous day: 12/16 0701 - 12/17 0700 In: 2093.5 [P.O.:725; I.V.:1318.5; IV Piggyback:50] Out: 1140 [Urine:500; Chest Tube:640] Intake/Output this shift: No intake/output data recorded.  General appearance: alert, cooperative, fatigued and no distress Heart: regular rate and rhythm Lungs: dim Left>right lower fields Abdomen: benign Extremities: + BLE edema Wound: incis healing well  Lab Results: Recent Labs    01/11/17 0425 01/12/17 0529  WBC 10.9* 18.8*  HGB 9.2* 9.3*  HCT 29.6* 29.8*  PLT 252 248   BMET:  Recent Labs    01/11/17 0425 01/12/17 0529  NA 136 133*  K 3.7 3.7  CL 101 99*  CO2 29 28  GLUCOSE 123* 128*  BUN 8 13  CREATININE 0.98 1.13*  CALCIUM 8.5* 8.6*    PT/INR:  Recent Labs    01/10/17 0333  LABPROT 17.2*  INR 1.42   ABG    Component Value Date/Time   PHART 7.399 01/11/2017 0422   HCO3 27.3 01/11/2017 0422   TCO2 24 01/10/2017 0234   O2SAT 97.0 01/11/2017 0422   CBG (last 3)  Recent Labs    01/11/17 0755  GLUCAP 125*    Meds Scheduled Meds: . acetaminophen  1,000 mg Oral Q6H   Or  . acetaminophen (TYLENOL) oral liquid 160 mg/5 mL  1,000 mg Oral Q6H  . bisacodyl  10 mg Oral Daily  . clindamycin   Topical BID  . fentaNYL   Intravenous Q4H  . furosemide  40 mg Oral Daily  . pantoprazole  40 mg Oral Q1200  . senna-docusate   1 tablet Oral QHS   Continuous Infusions: . sodium chloride 75 mL/hr at 01/12/17 0600  . magnesium sulfate 1 - 4 g bolus IVPB    . potassium chloride    . sodium chloride     PRN Meds:.albuterol, dextromethorphan-guaiFENesin, diphenhydrAMINE **OR** diphenhydrAMINE, hydrALAZINE, loperamide, naloxone **AND** sodium chloride flush, ondansetron (ZOFRAN) IV, potassium chloride, zolpidem  Xrays Dg Chest Port 1 View  Result Date: 01/12/2017 CLINICAL DATA:  Pneumothorax EXAM: PORTABLE CHEST 1 VIEW COMPARISON:  Yesterday FINDINGS: Chest tube on the right in stable position. Trace right apical pneumothorax. Cardiopericardial enlargement with a pericardial drain. Left pleural effusion and asymmetric airspace opacity. Right IJ catheter with tip at the upper right atrium. IMPRESSION: 1. Trace right apical pneumothorax. Stable positioning of right chest tube and pericardial drain. 2. Stable cardiopericardial size. 3. Asymmetric left pleural effusion and airspace opacity. Electronically Signed   By: Monte Fantasia M.D.   On: 01/12/2017 08:11   Dg Chest Port 1 View  Result Date: 01/11/2017 CLINICAL DATA:  51 year old female Postoperative day 1 status post pericardial window and right chest tube placed. stage IV lung cancer. Large pericardial effusion. Bilateral pleural effusions. EXAM: PORTABLE CHEST 1 VIEW COMPARISON:  01/10/2017 and earlier. FINDINGS:  Portable AP semi upright view at 0654 hours. Stable right chest tube. Stable pericardial drain. Stable right IJ central line. Small medial right lung apex pneumothorax is now visible. No residual right pleural effusion is evident. Dense left lung base opacity persists obscuring the left hemidiaphragm. Patchy superimposed left mid lung opacity has progressed since 01/09/2017. Stable cardiac size and mediastinal contours. IMPRESSION: 1. Stable right chest tube. Small medial right apical pneumothorax is now visible. 2. Stable cardiac silhouette. Continued left lower  lobe collapse or consolidation. 3. Increasing Patchy and confluent left perihilar opacity. 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 Electronically Signed   By: Genevie Ann M.D.   On: 01/11/2017 08:43   Dg Chest Port 1 View  Result Date: 01/11/2017 CLINICAL DATA:  Postoperative radiograph. Assess for pneumothorax. Patient has chest tubes in place. EXAM: PORTABLE CHEST 1 VIEW COMPARISON:  Chest radiograph performed 01/09/2017 FINDINGS: Bilateral chest tubes are noted. There is a residual small left pleural effusion. No significant pneumothorax is seen. Left perihilar airspace opacity may reflect atelectasis or possibly pneumonia. Underlying vascular congestion is noted. The cardiomediastinal silhouette is borderline enlarged. A right IJ line is noted ending about the distal SVC. No acute osseous abnormalities are seen. IMPRESSION: 1. Residual small left pleural effusion. No significant pneumothorax seen. 2. Vascular congestion and borderline cardiomegaly. Left perihilar airspace opacity may reflect atelectasis or possibly pneumonia. Electronically Signed   By: Garald Balding M.D.   On: 01/11/2017 00:27    Assessment/Plan: S/P Procedure(s) (LRB): PERICARDIAL WINDOW- SUB XYPHOID, RIGHT CHEST TUBE (N/A)   1 stable - keep CT's with significant drainage 2 no fevers but increased leukocytosis- monitor, push pulm toilet/rehab as able 3 primary managing other medical issues    LOS: 3 days    Gaspar Bidding 01/12/2017 Patient seen, chest x-ray personally reviewed Pulmonary status improved following drainage of effusions Leave both pericardial and pleural drain in place today Tharon Aquas trigt M.D.

## 2017-01-12 NOTE — Op Note (Signed)
[name], Nancy Moore NO.:  [id number]  MEDICAL RECORD NO.:  [id number]  LOCATION:  2C17C                        FACILITY:  [facility]  PHYSICIAN:  Ivin Poot, M.D.  DATE OF BIRTH:  13-Jan-1966  DATE OF PROCEDURE:  01/10/2017 DATE OF DISCHARGE:                              OPERATIVE REPORT   OPERATIONS: 1. Subxiphoid pericardial window and placement of 28-French Bard     drain. 2. Drainage of right pleural effusion and placement of 28-French     Argyle chest tube.  SURGEON:  Ivin Poot, MD.  ASSISTANT:  Scheryl Darter, [facility].  ANESTHESIA:  General.  PREOPERATIVE DIAGNOSES:  History of stage IV non-small carcinoma of the lung with new onset of large pericardial effusion and shortness of breath.  Moderate right pleural effusion.  POSTOPERATIVE DIAGNOSES:  History of stage IV non-small carcinoma of the lung with new onset of large pericardial effusion and shortness of breath.  Moderate right pleural effusion.  DESCRIPTION OF PROCEDURE:  After discussing the indications and benefits of the procedure with the patient and family and obtaining informed consent including discussion of the risks in this very ill patient with advanced lung cancer and after marking the proper site, the patient was brought to the operating room from preoperative holding and placed supine on the operating table.  She was induced for general anesthesia and tolerated this well without hemodynamic instability.  A transesophageal echo probe was placed by the Anesthesia team.  The chest and upper abdomen were prepped and draped as a sterile field.  A proper time-out was performed.  A small incision was made based on the xiphoid. The incision was carried down to the fascia.  The xiphoid cartilage was removed.  The sternal elevating retractor was placed.  The soft tissue anterior to the pericardium was dissected.  An incision in the thickened pericardium was made with a #15 blade and  bloody fluid under pressure immediately exited.  1.2 L of bloody pericardial fluid was drained.  The surface of the heart was inspected through the small opening and there was no significant fibrinous exudate or adhesions.  A 28-French soft Bard catheter was placed dependently in the posterior pericardium and brought out through separate incision and secured to the skin.  The retractor was removed.  The incision was closed in layers using Vicryl.  Next, a 28-French chest tube was placed in the posterior right pleural space with a small incision made in the fifth interspace at the midclavicular line.  This chest tube was secured to the skin.  Both tubes were connected to independent Pleur-evac drainage systems. Sterile dressings were placed.  The patient was reversed from anesthesia, extubated and returned to the recovery room.     Ivin Poot, M.D.     PV/MEDQ  D:  01/12/2017  T:  01/12/2017  Job:  [id number]

## 2017-01-12 NOTE — Telephone Encounter (Signed)
Pt asking for [facility] to do an extension letter. Dr Melvyn Novas will not do letter. I sent message to Weyman Pedro to see if pt has any other FMLA paper work and to f/u.

## 2017-01-12 NOTE — Progress Notes (Signed)
Palliative Medicine consult noted. Due to high referral volume, there may be a delay seeing this patient. Please call the Palliative Medicine Team office at [phone] if recommendations are needed in the interim.  Thank you for inviting [place] to see this patient.  Marjie Skiff Brighton Delio, RN, BSN, [facility] 01/12/2017 12:59 PM Cell [phone] 8:00-4:00 Monday-Friday Office [phone]

## 2017-01-12 NOTE — Clinical Social Work Note (Signed)
Clinical Social Work Assessment  Patient Details  Name: Nancy Moore MRN: [id number] Date of Birth: 1965-12-18  Date of referral:  01/12/17               Reason for consult:  Other (Comment Required)(Insurance questions)                Permission sought to share information with:  Family Supports Permission granted to share information::  Yes, Verbal Permission Granted  Name::        Agency::     Relationship::  Family at bedside  Contact Information:     Housing/Transportation Living arrangements for the past 2 months:  Single Family Home Source of Information:  Patient, Medical Team, Other (Comment Required)(Family at bedside) Patient Interpreter Needed:  None Criminal Activity/Legal Involvement Pertinent to Current Situation/Hospitalization:  No - Comment as needed Significant Relationships:  Siblings Lives with:    Do you feel safe going back to the place where you live?  Yes Need for family participation in patient care:  Yes (Comment)  Care giving concerns:  Questions on how to apply for Medicare and Medicaid.   Social Worker assessment / plan:  CSW met with patient. Family members at bedside. CSW introduced role and inquired about questions on how to apply for Medicare and Medicaid. CSW provided a packet of information on how to apply for Medicare. Patient stated she was told she had already applied for Medicaid but she needs to confirm. Address and phone number for [place] written down on Medicare packet in case she has not already applied. Patient is interested in home health services once stable for discharge. RNCM is aware of other part of social work consult regarding community palliative consult for hospital bed. No further concerns. CSW encouraged patient and her family to contact CSW as needed. CSW signing off as social work interventions are no longer needed.  Employment status:  [employer] information:  Other (Comment Required)([employer]) PT Recommendations:  Not assessed at this time Information / Referral to community resources:  Other (Comment Required)(Information on how to apply for Medicare and Medicaid)  Patient/Family's Response to care:  Patient is agreeable to obtaining resources from CSW. Patient's family supportive and involved in patient's care. Patient and her family appreciated social work intervention.  Patient/Family's Understanding of and Emotional Response to Diagnosis, Current Treatment, and Prognosis:  Patient and her family have a good understanding of the reason for admission and social work consult. Patient and her family appear happy with hospital care.  Emotional Assessment Appearance:  Appears stated age Attitude/Demeanor/Rapport:  Other(Pleasant) Affect (typically observed):  Accepting, Appropriate, Calm, Pleasant Orientation:  Oriented to Self, Oriented to Place, Oriented to  Time, Oriented to Situation Alcohol / Substance use:  Never Used Psych involvement (Current and /or in the community):  No (Comment)  Discharge Needs  Concerns to be addressed:  Care Coordination Readmission within the last 30 days:  No Current discharge risk:  None Barriers to Discharge:  Continued Medical Work up   Candie Chroman, LCSW 01/12/2017, 11:17 AM

## 2017-01-12 NOTE — Progress Notes (Signed)
PROGRESS NOTE [facility]   Nancy Moore   OXB:[id number] DOB: 10-10-1965  DOA: 01/09/2017 PCP: Darreld Mclean, MD   Brief Narrative:  Nancy Moore is a 51 year old female with medical history significant for stage IV lung cancer, hypertension who presented to the emergency department complaining of severe shortness of breath. She recently had a re-staging CT scan of her chest which showed a large, complex pericardial effusion as well as a large right pleural effusion. An urgent referral to cardiology was done and Dr. Debara Pickett  performed a 2D Echo, which showed massive pericardial effusion with signs of early tamponade. She was sent to ED further evaluation and treatment.  She is admitted pericardial effusion with early tamponade underwent pericardial window with R chest tube placement.   Subjective: Patient seen and examined, breathing improved today, pain better controlled. She remains in PCA pump. No acute events overnight.   Assessment & Plan: Pericardial effusion and pleural effusion Likely secondary to malignancy,  S/p pericardial window and R chest tube - Good out put in both drains  Pain management PRN  Per TCTS   Adenocarcinoma of left lung, stage IV Patient followed by Dr. Earlie Server as an outpatient Continue Afatinib   Hypertension BP remains stable  A line d/ced  Continue current regimen   Hypokalemia - resolved  Repleted  HypoMag Replete  Check in AM   Acute renal failure - Cr now increased  Likely due to Lasix  Hypoalbuminemia  D/c Lasix  IVF  Encourage oral hydration   Leukocytosis  Reactive vs Suspected Pneumonia, immunocompromise patient, CXR can't r/o infection  Add differential, get UA and urine culture, if spike fever collect blood cultures  Start Augmentin   Follow up clinically   DVT prophylaxis: SCDs Code Status: Full code Family Communication: Family at bedside Disposition Plan: When clear by TCTS  Consultants:   TCTS - Dr Prescott Gum   Procedures:   None  Antimicrobials:  None   Objective: Vitals:   01/12/17 0009 01/12/17 0400 01/12/17 0405 01/12/17 0800  BP:  90/67 90/67 106/67  Pulse:  74 76 80  Resp: 16 10 10  (!) 21  Temp:   97.7 F (36.5 C)   TempSrc:      SpO2: 100% 100% 100% 99%  Weight:      Height:        Intake/Output Summary (Last 24 hours) at 01/12/2017 0853 Last data filed at 01/12/2017 0600 Gross per 24 hour  Intake 2093.5 ml  Output 1140 ml  Net 953.5 ml   Filed Weights   01/09/17 1508  Weight: 102.5 kg (226 lb)    Examination:  General: Pt is alert, awake, not in acute distress Cardiovascular: RRR, S1/S2 +, no rubs, no gallops Respiratory: Diminished BS b/l, mild rales at RLL, no wheezing  Abdominal: Soft, NT, ND, + bowel sounds Extremities: 1 + edema in b/l LE    Data Reviewed: I have personally reviewed following labs and imaging studies  CBC: Recent Labs  Lab 01/08/17 1055 01/09/17 1510 01/10/17 0333 01/11/17 0425 01/12/17 0529  WBC 10.7* 12.6* 13.2* 10.9* 18.8*  NEUTROABS 7.6*  --   --   --   --   HGB 10.2* 10.9* 10.7* 9.2* 9.3*  HCT 31.8* 34.2* 32.3* 29.6* 29.8*  MCV 83.8 83.8 82.6 83.4 83.0  PLT 268 287 293 252 683   Basic Metabolic Panel: Recent Labs  Lab 01/08/17 1055 01/09/17 1510 01/09/17 2221 01/10/17 0333 01/11/17 0425 01/11/17 0930 01/12/17 0529  NA 135* 133*  --  132* 136  --  133*  K 3.2* 3.1*  --  3.2* 3.7  --  3.7  CL  --  96*  --  95* 101  --  99*  CO2 25 23  --  26 29  --  28  GLUCOSE 109 103*  --  101* 123*  --  128*  BUN 14.6 12  --  14 8  --  13  CREATININE 1.3* 1.23*  --  1.26* 0.98  --  1.13*  CALCIUM 9.5 9.6  --  9.2 8.5*  --  8.6*  MG  --   --  2.1  --   --  1.6*  --    GFR: Estimated Creatinine Clearance: 72 mL/min (A) (by C-G formula based on SCr of 1.13 mg/dL (H)). Liver Function Tests: Recent Labs  Lab 01/08/17 1055 01/10/17 0333 01/11/17 0930 01/12/17 0529  AST 31 33  --  23  ALT 34 34  --  22  ALKPHOS  82 81  --  57  BILITOT 1.74* 1.6*  --  1.3*  PROT 7.1 6.8  --  5.2*  ALBUMIN 3.6 3.5 3.0* 2.6*   No results for input(s): LIPASE, AMYLASE in the last 168 hours. No results for input(s): AMMONIA in the last 168 hours. Coagulation Profile: Recent Labs  Lab 01/09/17 2221 01/10/17 0333  INR 1.49 1.42   Cardiac Enzymes: No results for input(s): CKTOTAL, CKMB, CKMBINDEX, TROPONINI in the last 168 hours. BNP (last 3 results) No results for input(s): PROBNP in the last 8760 hours. HbA1C: No results for input(s): HGBA1C in the last 72 hours. CBG: Recent Labs  Lab 01/11/17 0755  GLUCAP 125*   Lipid Profile: No results for input(s): CHOL, HDL, LDLCALC, TRIG, CHOLHDL, LDLDIRECT in the last 72 hours. Thyroid Function Tests: No results for input(s): TSH, T4TOTAL, FREET4, T3FREE, THYROIDAB in the last 72 hours. Anemia Panel: No results for input(s): VITAMINB12, FOLATE, FERRITIN, TIBC, IRON, RETICCTPCT in the last 72 hours. Sepsis Labs: Recent Labs  Lab 01/09/17 2221 01/10/17 0333  PROCALCITON <0.10  --   LATICACIDVEN 1.5 1.7    Recent Results (from the past 240 hour(s))  Culture, blood (Routine X 2) w Reflex to ID Panel     Status: None (Preliminary result)   Collection Time: 01/09/17 10:35 PM  Result Value Ref Range Status   Specimen Description BLOOD RIGHT HAND  Final   Special Requests IN PEDIATRIC BOTTLE Blood Culture adequate volume  Final   Culture NO GROWTH 1 DAY  Final   Report Status PENDING  Incomplete  Culture, blood (Routine X 2) w Reflex to ID Panel     Status: None (Preliminary result)   Collection Time: 01/09/17 10:50 PM  Result Value Ref Range Status   Specimen Description BLOOD LEFT HAND  Final   Special Requests IN PEDIATRIC BOTTLE Blood Culture adequate volume  Final   Culture NO GROWTH 1 DAY  Final   Report Status PENDING  Incomplete  Surgical pcr screen     Status: Abnormal   Collection Time: 01/09/17 11:10 PM  Result Value Ref Range Status   MRSA, PCR  NEGATIVE NEGATIVE Final   Staphylococcus aureus POSITIVE (A) NEGATIVE Final    Comment: (NOTE) The Xpert SA Assay (FDA approved for NASAL specimens in patients 12 years of age and older), is one component of a comprehensive surveillance program. It is not intended to diagnose infection nor to guide or monitor treatment.  Radiology Studies: Dg Chest Port 1 View  Result Date: 01/12/2017 CLINICAL DATA:  Pneumothorax EXAM: PORTABLE CHEST 1 VIEW COMPARISON:  Yesterday FINDINGS: Chest tube on the right in stable position. Trace right apical pneumothorax. Cardiopericardial enlargement with a pericardial drain. Left pleural effusion and asymmetric airspace opacity. Right IJ catheter with tip at the upper right atrium. IMPRESSION: 1. Trace right apical pneumothorax. Stable positioning of right chest tube and pericardial drain. 2. Stable cardiopericardial size. 3. Asymmetric left pleural effusion and airspace opacity. Electronically Signed   By: Monte Fantasia M.D.   On: 01/12/2017 08:11   Dg Chest Port 1 View  Result Date: 01/11/2017 CLINICAL DATA:  51 year old female Postoperative day 1 status post pericardial window and right chest tube placed. stage IV lung cancer. Large pericardial effusion. Bilateral pleural effusions. EXAM: PORTABLE CHEST 1 VIEW COMPARISON:  01/10/2017 and earlier. FINDINGS: Portable AP semi upright view at 0654 hours. Stable right chest tube. Stable pericardial drain. Stable right IJ central line. Small medial right lung apex pneumothorax is now visible. No residual right pleural effusion is evident. Dense left lung base opacity persists obscuring the left hemidiaphragm. Patchy superimposed left mid lung opacity has progressed since 01/09/2017. Stable cardiac size and mediastinal contours. IMPRESSION: 1. Stable right chest tube. Small medial right apical pneumothorax is now visible. 2. Stable cardiac silhouette. Continued left lower lobe collapse or consolidation. 3.  Increasing Patchy and confluent left perihilar opacity. 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 Electronically Signed   By: Genevie Ann M.D.   On: 01/11/2017 08:43   Dg Chest Port 1 View  Result Date: 01/11/2017 CLINICAL DATA:  Postoperative radiograph. Assess for pneumothorax. Patient has chest tubes in place. EXAM: PORTABLE CHEST 1 VIEW COMPARISON:  Chest radiograph performed 01/09/2017 FINDINGS: Bilateral chest tubes are noted. There is a residual small left pleural effusion. No significant pneumothorax is seen. Left perihilar airspace opacity may reflect atelectasis or possibly pneumonia. Underlying vascular congestion is noted. The cardiomediastinal silhouette is borderline enlarged. A right IJ line is noted ending about the distal SVC. No acute osseous abnormalities are seen. IMPRESSION: 1. Residual small left pleural effusion. No significant pneumothorax seen. 2. Vascular congestion and borderline cardiomegaly. Left perihilar airspace opacity may reflect atelectasis or possibly pneumonia. Electronically Signed   By: Garald Balding M.D.   On: 01/11/2017 00:27      Scheduled Meds: . acetaminophen  1,000 mg Oral Q6H   Or  . acetaminophen (TYLENOL) oral liquid 160 mg/5 mL  1,000 mg Oral Q6H  . bisacodyl  10 mg Oral Daily  . clindamycin   Topical BID  . fentaNYL   Intravenous Q4H  . furosemide  40 mg Oral Daily  . pantoprazole  40 mg Oral Q1200  . senna-docusate  1 tablet Oral QHS   Continuous Infusions: . sodium chloride 75 mL/hr at 01/12/17 0600  . potassium chloride    . sodium chloride       LOS: 3 days    Time spent: Total of 25 minutes spent with pt, greater than 50% of which was spent in discussion of  treatment, counseling and coordination of care   Chipper Oman, MD Pager: Text Page via [email or web address]   If 7PM-7AM, please contact night-coverage [email or web address] 01/12/2017, 8:53 AM

## 2017-01-12 NOTE — Telephone Encounter (Signed)
Family called -pt admitted.

## 2017-01-12 NOTE — Plan of Care (Signed)
Pt transferred 3 steps from bed to chair; pt resp and chest tubes WDL; foley cath d/c; purewick in place; mag completed; TED hose on; resting in room; family/friends visiting; will continue to monitor.   [place]  Marylee Belzer, RN

## 2017-01-13 ENCOUNTER — Encounter: Payer: Self-pay | Admitting: General Practice

## 2017-01-13 ENCOUNTER — Encounter: Payer: Self-pay | Admitting: *Deleted

## 2017-01-13 ENCOUNTER — Inpatient Hospital Stay (HOSPITAL_COMMUNITY): Payer: 59

## 2017-01-13 DIAGNOSIS — Z515 Encounter for palliative care: Secondary | ICD-10-CM

## 2017-01-13 DIAGNOSIS — Z7189 Other specified counseling: Secondary | ICD-10-CM

## 2017-01-13 LAB — BASIC METABOLIC PANEL
Anion gap: 6 (ref 5–15)
BUN: 13 mg/dL (ref 6–20)
CO2: 26 mmol/L (ref 22–32)
Calcium: 8.5 mg/dL — ABNORMAL LOW (ref 8.9–10.3)
Chloride: 102 mmol/L (ref 101–111)
Creatinine, Ser: 0.93 mg/dL (ref 0.44–1.00)
GFR calc Af Amer: >60 mL/min (ref >60)
GFR calc non Af Amer: >60 mL/min (ref >60)
Glucose, Bld: 85 mg/dL (ref 65–99)
Potassium: 3.7 mmol/L (ref 3.5–5.1)
Sodium: 134 mmol/L — ABNORMAL LOW (ref 135–145)

## 2017-01-13 LAB — CBC WITH DIFFERENTIAL/PLATELET
Basophils Absolute: 0 10*3/uL (ref 0.0–0.1)
Basophils Relative: 0 %
Eosinophils Absolute: 0.7 10*3/uL (ref 0.0–0.7)
Eosinophils Relative: 4 %
HCT: 32.2 % — ABNORMAL LOW (ref 36.0–46.0)
Hemoglobin: 10.3 g/dL — ABNORMAL LOW (ref 12.0–15.0)
Lymphocytes Relative: 11 %
Lymphs Abs: 1.9 10*3/uL (ref 0.7–4.0)
MCH: 26.3 pg (ref 26.0–34.0)
MCHC: 32 g/dL (ref 30.0–36.0)
MCV: 82.4 fL (ref 78.0–100.0)
Monocytes Absolute: 2.7 10*3/uL — ABNORMAL HIGH (ref 0.1–1.0)
Monocytes Relative: 17 %
Neutro Abs: 11.1 10*3/uL — ABNORMAL HIGH (ref 1.7–7.7)
Neutrophils Relative %: 68 %
Platelets: 266 10*3/uL (ref 150–400)
RBC: 3.91 MIL/uL (ref 3.87–5.11)
RDW: 16.4 % — ABNORMAL HIGH (ref 11.5–15.5)
WBC: 16.4 10*3/uL — ABNORMAL HIGH (ref 4.0–10.5)

## 2017-01-13 LAB — TYPE AND SCREEN
ABO/RH(D): O NEG
Antibody Screen: NEGATIVE
Unit division: 0
Unit division: 0

## 2017-01-13 LAB — BPAM RBC
Blood Product Expiration Date: 201812252359
Blood Product Expiration Date: 201812252359
ISSUE DATE / TIME: 201812152104
ISSUE DATE / TIME: 201812152104
Unit Type and Rh: 9500
Unit Type and Rh: 9500

## 2017-01-13 LAB — URINE CULTURE: Culture: >=100000 — AB

## 2017-01-13 LAB — MAGNESIUM: Magnesium: 1.7 mg/dL (ref 1.7–2.4)

## 2017-01-13 MED ORDER — CEFDINIR 125 MG/5ML PO SUSR
250.0000 mg | Freq: Two times a day (BID) | ORAL | Status: AC
  Administered 2017-01-13 – 2017-01-17 (×10): 250 mg via ORAL
  Filled 2017-01-13 (×10): qty 10

## 2017-01-13 MED ORDER — AFATINIB DIMALEATE 40 MG PO TABS
40.0000 mg | ORAL_TABLET | Freq: Every day | ORAL | Status: DC
  Administered 2017-01-14 – 2017-01-19 (×6): 40 mg via ORAL
  Filled 2017-01-13 (×9): qty 1

## 2017-01-13 NOTE — Progress Notes (Signed)
[facility] CSW Progress Note  Per CSW A [place], patient was to fax FMLA paperwork to [facility] - has not been received to date.  CSW checked w Sharyn Lull (FMLA/HIM), RN in clinc - no one had received paperwork either.  CSW called patient who is currently inpatient at [facility].  Daughter spoke w patient who indicated that she needed a letter stating that an FMLA extension was required due to patient's current condition.  PAtient will fax contact information for FMLA administrator to CSW, asks that this be provided to MD so letter can be generated.  Edwyna Shell, LCSW Clinical Social Worker Phone:  [phone]

## 2017-01-13 NOTE — Care Management Note (Signed)
Case Management Note  Patient Details  Name: Nancy Moore MRN: [id number] Date of Birth: Sep 19, 1965  Subjective/Objective:    Pt admitted with malignant pleural effusions  - pt has stage IV Lung CA              Action/Plan:   PTA independent from home.  CM requested Palliative Consult for Goals of Care - including recommendations for community programs offering palliative and or hospice.  Pt will need PT eval once medically stable from cardiothoracic standpoint.     Expected Discharge Date:  01/15/17               Expected Discharge Plan:  Home w Hospice Care  In-House Referral:  Clinical Social Work  Discharge planning Services  CM Consult  Post Acute Care Choice:    Choice offered to:     DME Arranged:    DME Agency:     HH Arranged:    HH Agency:     Status of Service:     If discussed at [name], dates discussed:    Additional Comments: Palliative followed up with CM - ongoing goals of care discussions - discharge disposition not yet determined.  Pt still has 2 chest tubes and will need PT eval once stable.   Maryclare Labrador, RN 01/13/2017, 3:27 PM

## 2017-01-13 NOTE — Progress Notes (Signed)
Oncology Nurse Navigator Documentation  Oncology Nurse Navigator Flowsheets 01/13/2017  Navigator Location [facility]  Navigator Encounter Type Other/I called oral pharmacist to see how to get Nancy Moore started back on her [place].  She updated me. I called patient's nurse and updated her to ask patient to bring her medication into hospital.  She updated patient with me on the phone. Patient states she will get medication in today or tomorrow. She states she would like to see Dr. Julien Nordmann. I will updated Dr. Julien Nordmann. I will also update Dr. Julien Nordmann on order for medication.   Patient Visit Type Inpatient  Treatment Phase Treatment  Barriers/Navigation Needs Coordination of Care;Education  Education Other  Interventions Education;Coordination of Care  Coordination of Care Other  Education Method Verbal  Acuity Level 2  Time Spent with Patient 108

## 2017-01-13 NOTE — Evaluation (Signed)
Physical Therapy Evaluation Patient Details Name: Nancy Moore MRN: [id number] DOB: 02-10-1965 Today's Date: 01/13/2017   History of Present Illness  Pt is a 51 y.o. female with PMH significant for stage IVlung adenocarcinoma and HTN, who presented to ED on 01/09/17 with worsening SOB. Pt recently had a re-staging chest CT which showed a large, complex pericardial effusion and large right pleural effusion. Now s/p subxyphoid pericardial window and R chest tube placement on 12/15.    Clinical Impression  Pt presents with decreased activity tolerance, pain, and an overall decrease in functional mobility secondary to above. PTA, pt indep and lives alone; will have 24/7 assist from family available at d/c if needed. Today, pt required min-modA for bed mobility, and able to amb 40' with Eva walker and min guard (required minA for balance with no DME). SpO2 >90% on RA throughout. Pt very motivated to participate. Recommend CIR-level therapies at d/c to maximize functional mobility and independence. Will continue to follow acutely to address established goals.     Follow Up Recommendations CIR    Equipment Recommendations  Other (comment)(TBD next venue)    Recommendations for Other Services OT consult     Precautions / Restrictions Precautions Precautions: Fall Precaution Comments: 2x chest tubes, PCA pump Restrictions Weight Bearing Restrictions: No      Mobility  Bed Mobility Overal bed mobility: Needs Assistance Bed Mobility: Supine to Sit;Rolling;Sit to Supine Rolling: Min assist   Supine to sit: Mod assist;HOB elevated;Min assist Sit to supine: Mod assist   General bed mobility comments: MinA for trunk elevation and modA to scoot hips to EOB. ModA to maneuver BLEs returning to supine. ModA+2 to scoot up in bed  Transfers Overall transfer level: Needs assistance Equipment used: 4-wheeled walker(Eva walker) Transfers: Sit to/from Stand Sit to Stand: Min assist          General transfer comment: Pt insistent on pulling on eva walker to come into standing despite cues for safe hand placement. MinA for balance  Ambulation/Gait Ambulation/Gait assistance: Min guard Ambulation Distance (Feet): 40 Feet Assistive device: 4-wheeled walker(Eva walker) Gait Pattern/deviations: Step-through pattern;Decreased stride length Gait velocity: Decreased Gait velocity interpretation: <1.8 ft/sec, indicative of risk for recurrent falls General Gait Details: Very slow amb with eva walker and min guard for balance. Pt c/o RLE pain and increased fatigue. Able to take side steps at EOB with no DME and minA for balance  Stairs            Wheelchair Mobility    Modified Rankin (Stroke Patients Only)       Balance Overall balance assessment: Needs assistance   Sitting balance-Leahy Scale: Fair       Standing balance-Leahy Scale: Fair Standing balance comment: Able to static stand with no UE support                             Pertinent Vitals/Pain Pain Assessment: Faces Faces Pain Scale: Hurts little more Pain Location: Abdomen Pain Descriptors / Indicators: Aching;Grimacing Pain Intervention(s): Limited activity within patient's tolerance;Monitored during session    Home Living Family/patient expects to be discharged to:: Private residence Living Arrangements: Alone Available Help at Discharge: Family;Available 24 hours/day(Sister) Type of Home: House Home Access: Stairs to enter   [employer] of Steps: 3 Home Layout: One level Home Equipment: None Additional Comments: Sister plans to stay with pt at d/c for 24/7 assist     Prior Function Level of Independence:  Independent         Comments: Wears 2L O2  occasionally with activity     Hand Dominance        Extremity/Trunk Assessment   Upper Extremity Assessment Upper Extremity Assessment: Generalized weakness    Lower Extremity Assessment Lower Extremity  Assessment: Generalized weakness       Communication   Communication: No difficulties  Cognition Arousal/Alertness: Awake/alert Behavior During Therapy: Flat affect Overall Cognitive Status: Within Functional Limits for tasks assessed                                        General Comments General comments (skin integrity, edema, etc.): Family present during session. SpO2 >90% on RA    Exercises     Assessment/Plan    PT Assessment Patient needs continued PT services  PT Problem List Decreased strength;Decreased activity tolerance;Decreased balance;Decreased mobility;Decreased knowledge of use of DME;Cardiopulmonary status limiting activity;Pain       PT Treatment Interventions DME instruction;Gait training;Stair training;Functional mobility training;Therapeutic activities;Therapeutic exercise;Balance training;Patient/family education;Wheelchair mobility training    PT Goals (Current goals can be found in the Care Plan section)  Acute Rehab PT Goals Patient Stated Goal: Get stronger at rehab before return home PT Goal Formulation: With patient Time For Goal Achievement: 01/27/17 Potential to Achieve Goals: Good    Frequency Min 3X/week   Barriers to discharge        Co-evaluation               AM-PAC PT "6 Clicks" Daily Activity  Outcome Measure Difficulty turning over in bed (including adjusting bedclothes, sheets and blankets)?: Unable Difficulty moving from lying on back to sitting on the side of the bed? : Unable Difficulty sitting down on and standing up from a chair with arms (e.g., wheelchair, bedside commode, etc,.)?: Unable Help needed moving to and from a bed to chair (including a wheelchair)?: A Little Help needed walking in hospital room?: A Little Help needed climbing 3-5 steps with a railing? : A Lot 6 Click Score: 11    End of Session Equipment Utilized During Treatment: Gait belt Activity Tolerance: Patient tolerated  treatment well;Patient limited by fatigue Patient left: in bed;with call bell/phone within reach;with family/visitor present Nurse Communication: Mobility status PT Visit Diagnosis: Other abnormalities of gait and mobility (R26.89);Muscle weakness (generalized) (M62.81)    Time: 1615-1700 PT Time Calculation (min) (ACUTE ONLY): 45 min   Charges:   PT Evaluation $PT Eval Moderate Complexity: 1 Mod PT Treatments $Gait Training: 8-22 mins $Therapeutic Activity: 8-22 mins   PT G Codes:       Mabeline Caras, PT, DPT Acute Rehab Services  Pager: [place] 01/13/2017, 5:29 PM

## 2017-01-13 NOTE — Care Management Note (Signed)
Case Management Note  Patient Details  Name: Nancy Moore MRN: [id number] Date of Birth: 09-24-1965  Subjective/Objective:    Pt admitted with malignant pleural effusions  - pt has stage IV Lung CA              Action/Plan:   PTA independent from home.  CM requested Palliative Consult for Goals of Care - including recommendations for community programs offering palliative and or hospice.  Pt will need PT eval once medically stable from cardiothoracic standpoint.     Expected Discharge Date:  01/15/17               Expected Discharge Plan:  Home w Hospice Care  In-House Referral:  Clinical Social Work  Discharge planning Services  CM Consult  Post Acute Care Choice:    Choice offered to:     DME Arranged:    DME Agency:     HH Arranged:    HH Agency:     Status of Service:     If discussed at [name], dates discussed:    Additional Comments:  Maryclare Labrador, RN 01/13/2017, 1:24 PM

## 2017-01-13 NOTE — Consult Note (Signed)
Consultation Note Date: 01/13/2017   Patient Name: Nancy Moore  DOB: 1965-02-03  MRN: [id number]  Age / Sex: 51 y.o., female  PCP: Copland, Gay Filler, MD Referring Physician: Patrecia Pour, Christean Grief, MD  Reason for Consultation: Establishing goals of care  HPI/Patient Profile: 51 y.o. female  with past medical history of crohns, and stage 4 lung cancer with pleural effusions who was admitted on 01/09/2017 with shortness of breath.  She was found to have pericardial effusion with early tamponade.  She underwent surgery and the pericardial effusion has been drained.  Chest tubes are in place.  The patient has many questions about a plan going forward.  PMT was called to assist with planning and [place].  Clinical Assessment and Goals of Care:  I have reviewed medical records including EPIC notes, labs and imaging, received report from the care team, assessed the patient and then met at the bedside along with her mother and friends to discuss goals of care and issues of importance to the patient.  I introduced Palliative Medicine as specialized medical care for people living with serious illness. It focuses on providing relief from the symptoms and stress of a serious illness. The goal is to improve quality of life for both the patient and the family.  We discussed a brief life review of the patient. She works at [employer].  She has no children but receives support from her mother, sister and friends.  She has a list of items concerning her:  Housing (her house is way too big), handicap access, hospital bed, home health services.  We talked about some of those items - but I also suggested that given her medical needs she may be [place] served by SNF or CIR at discharge rather than going home right away.  Her mother was supportive of that idea as she had a very good experience at [facility] after her stroke.  As far as  functional and nutritional status Nancy Moore is able to stand and pivot.  She has been too short of breath to do much else.  Today her oxygen requirements are significantly better so she is optimistic about what she will be able to do.  She is eating approximately 50% of her meals.  Advanced directives, concepts specific to code status, artifical feeding and hydration, and rehospitalization were considered and discussed.  Specifically the patient would like to appoint her sister Chante as her HCPOA.  She would also like to complete advanced directives (we discussed them).  With regard to code status - she does not want to have chest compressions.  At this point she wants everything else.  She agrees to read the Hard Choices booklet I have brought for her and give code status further consideration.  Hospice and Palliative Care services outpatient were explained.  She does not feel she is ready for Hospice.  She is taking immunotherapy and hopeful for PT and OT that will improve her function.  Questions and concerns were addressed.  Hard Choices booklet left  for review. The family was encouraged to call with questions or concerns.    Primary Decision Maker:  PATIENT    SUMMARY OF RECOMMENDATIONS    Will request chaplain services to assist with and notarize and advanced directive with HCPOA Have changed to partial code (no chest compressions).   PMT will meet with Nancy Moore again today to continue our discussion. Recommend Palliative follow up outpatient  Code Status/Advance Care Planning:  Partial - no chest compressions   Symptom Management:   Patient appears comfortable on current medications   Psycho-social/Spiritual:   Desire for further Chaplaincy support: yes requested.  Prognosis:  Difficult to predict given her age and spirit however I am concerned her malignancy will continue to cause fluid accumulation and respiratory distress.  I believe It is reasonable to expect that she has  a prognosis of less than 6 months.    Discharge Planning: To Be Determined Discharge with palliative to follow at any venue.      Primary Diagnoses: Present on Admission: . Pericardial effusion . Essential hypertension . Adenocarcinoma of left lung, stage 4 ([place]) . Pleural effusion . Hypokalemia . Dyspnea . Leukocytosis . AKI (acute kidney injury) ([place])   I have reviewed the medical record, interviewed the patient and family, and examined the patient. The following aspects are pertinent.  Past Medical History:  Diagnosis Date  . Adenocarcinoma of left lung, stage 4 ([place]) 10/28/2016  . Goals of care, counseling/discussion 10/28/2016  . Hypertension    Social History   Socioeconomic History  . Marital status: Married    Spouse name: None  . Number of children: None  . Years of education: None  . Highest education level: None  Social Needs  . Financial resource strain: None  . Food insecurity - worry: None  . Food insecurity - inability: None  . Transportation needs - medical: None  . Transportation needs - non-medical: None  Occupational History  . None  Tobacco Use  . Smoking status: Never Smoker  . Smokeless tobacco: Never Used  Substance and Sexual Activity  . Alcohol use: Yes    Comment: socially  . Drug use: No  . Sexual activity: No  Other Topics Concern  . None  Social History Narrative  . None   Family History  Problem Relation Age of Onset  . Asthma Mother   . Stroke Mother   . Hypertension Mother   . Heart attack Father   . Hypertension Father   . Hyperlipidemia Father   . Dementia Father   . Emphysema Maternal Grandmother   . Hypertension Maternal Grandmother   . Colon cancer Paternal Grandmother    Scheduled Meds: . acetaminophen  1,000 mg Oral Q6H   Or  . acetaminophen (TYLENOL) oral liquid 160 mg/5 mL  1,000 mg Oral Q6H  . bisacodyl  10 mg Oral Daily  . cefdinir  250 mg Oral BID  . clindamycin   Topical BID  . feeding supplement   1 Container Oral TID BM  . fentaNYL   Intravenous Q4H  . furosemide  40 mg Oral Daily  . mupirocin ointment   Nasal BID  . pantoprazole  40 mg Oral Q1200  . senna-docusate  1 tablet Oral QHS   Continuous Infusions: . sodium chloride 50 mL/hr at 01/13/17 0923  . potassium chloride    . sodium chloride     PRN Meds:.albuterol, dextromethorphan-guaiFENesin, diphenhydrAMINE **OR** diphenhydrAMINE, hydrALAZINE, loperamide, naloxone **AND** sodium chloride flush, ondansetron (ZOFRAN) IV, potassium chloride, zolpidem  Allergies  Allergen Reactions  . Percocet [Oxycodone-Acetaminophen] Other (See Comments)    "makes me dizzy"  . Lisinopril Cough  . Losartan   . Valsartan Cough   Review of Systems denies shortness of breath.  Complains of weakness, fatigue, pain from chest tubes.  Denies insomnia and constipation  Physical Exam  Thin well developed female, pale in color A&O x 3 with clear speech, appropriate.  Vital Signs: BP 96/67   Pulse 83   Temp 97.8 F (36.6 C) (Oral)   Resp 10   Ht '5\' 6"'  (1.676 m)   Wt 102.5 kg (226 lb)   LMP 03/15/2010   SpO2 95%   BMI 36.48 kg/m  Pain Assessment: 0-10 POSS *See Group Information*: S-Acceptable,Sleep, easy to arouse Pain Score: 6    SpO2: SpO2: 95 % O2 Device:SpO2: 95 % O2 Flow Rate: .O2 Flow Rate (L/min): 2 L/min  IO: Intake/output summary:   Intake/Output Summary (Last 24 hours) at 01/13/2017 1445 Last data filed at 01/13/2017 1034 Gross per 24 hour  Intake 1693 ml  Output 825 ml  Net 868 ml    LBM: Last BM Date: 01/10/17 Baseline Weight: Weight: 102.5 kg (226 lb) Most recent weight: Weight: 102.5 kg (226 lb)     Palliative Assessment/Data: 50%     Time In: 12:00 Time Out: 1:00 Time Total: 60 min Greater than 50%  of this time was spent counseling and coordinating care related to the above assessment and plan.  Signed by: Florentina Jenny, PA-C Palliative Medicine Pager: [phone]  Please contact  Palliative Medicine Team phone at [phone] for questions and concerns.  For individual provider: See Amion             ;l

## 2017-01-13 NOTE — Progress Notes (Signed)
PROGRESS NOTE [facility]   Nancy Moore   ZOX:[id number] DOB: November 02, 1965  DOA: 01/09/2017 PCP: Darreld Mclean, MD   Brief Narrative:  Nancy Moore is a 51 year old female with medical history significant for stage IV lung cancer, hypertension who presented to the emergency department complaining of severe shortness of breath. She recently had a re-staging CT scan of her chest which showed a large, complex pericardial effusion as well as a large right pleural effusion. An urgent referral to cardiology was done and Dr. Debara Pickett  performed a 2D Echo, which showed massive pericardial effusion with signs of early tamponade. She was sent to ED further evaluation and treatment.  She is admitted pericardial effusion with early tamponade underwent pericardial window with R chest tube placement.   Subjective: Patient seen and examined, she is doing much better, continues to improve, now off O2, pain well controlled. Was OOB yesterday tolerated well   Assessment & Plan: Pericardial effusion and pleural effusion Likely secondary to malignancy,  S/p pericardial window and R chest tube - Good out put in both drains . Pathology results pending  Pain management PRN  Per TCTS   Adenocarcinoma of left lung, stage IV Patient followed by Dr. Earlie Server as an outpatient Continue Afatinib   Hypertension BP remains stable  A line d/ced  Continue current regimen   Hypokalemia - resolved  Repleted  HypoMag Replete  Check in AM   Acute renal failure - Labs pending  Likely due to Lasix  Hypoalbuminemia  Lasix was d/ced  Continue gentle hydration for now  Encourage oral hydration   UTI - Klebsiella - causing leukocytosis  Patient report increase in frequency  Patient was on Augmentin for possible PNA will switch to [facility] for better sensitivity, as patients in immunocompetent need to be more aggressive  Continue to monitor    DVT prophylaxis: SCDs Code Status: Full code Family  Communication: Family at bedside Disposition Plan: When clear by TCTS  Consultants:   TCTS - Dr Prescott Gum   Procedures:   None  Antimicrobials:  None   Objective: Vitals:   01/13/17 0400 01/13/17 0402 01/13/17 0405 01/13/17 0741  BP: 107/76 99/75  105/82  Pulse: 77 77  80  Resp: 16 16 13 14   Temp:  (!) 97.3 F (36.3 C)  (!) 97.4 F (36.3 C)  TempSrc:  Oral  Oral  SpO2: 100% 100% 99% 96%  Weight:      Height:        Intake/Output Summary (Last 24 hours) at 01/13/2017 0850 Last data filed at 01/13/2017 0405 Gross per 24 hour  Intake 2170 ml  Output 450 ml  Net 1720 ml   Filed Weights   01/09/17 1508  Weight: 102.5 kg (226 lb)    Examination:  General: NAD, AAOx3  Cardiovascular: RRR S1S2, chest tubes in place  Respiratory: Diminish breath sounds b/l  Abdominal: Soft, NT, ND, bowel sounds + Extremities: edema 1+ bilaterrally   Data Reviewed: I have personally reviewed following labs and imaging studies  CBC: Recent Labs  Lab 01/08/17 1055 01/09/17 1510 01/10/17 0333 01/11/17 0425 01/12/17 0529  WBC 10.7* 12.6* 13.2* 10.9* 18.8*  NEUTROABS 7.6*  --   --   --  14.7*  HGB 10.2* 10.9* 10.7* 9.2* 9.3*  HCT 31.8* 34.2* 32.3* 29.6* 29.8*  MCV 83.8 83.8 82.6 83.4 83.0  PLT 268 287 293 252 811   Basic Metabolic Panel: Recent Labs  Lab 01/08/17 1055 01/09/17 1510 01/09/17 2221 01/10/17  9675 01/11/17 0425 01/11/17 0930 01/12/17 0529  NA 135* 133*  --  132* 136  --  133*  K 3.2* 3.1*  --  3.2* 3.7  --  3.7  CL  --  96*  --  95* 101  --  99*  CO2 25 23  --  26 29  --  28  GLUCOSE 109 103*  --  101* 123*  --  128*  BUN 14.6 12  --  14 8  --  13  CREATININE 1.3* 1.23*  --  1.26* 0.98  --  1.13*  CALCIUM 9.5 9.6  --  9.2 8.5*  --  8.6*  MG  --   --  2.1  --   --  1.6*  --    GFR: Estimated Creatinine Clearance: 72 mL/min (A) (by C-G formula based on SCr of 1.13 mg/dL (H)). Liver Function Tests: Recent Labs  Lab 01/08/17 1055 01/10/17 0333  01/11/17 0930 01/12/17 0529  AST 31 33  --  23  ALT 34 34  --  22  ALKPHOS 82 81  --  57  BILITOT 1.74* 1.6*  --  1.3*  PROT 7.1 6.8  --  5.2*  ALBUMIN 3.6 3.5 3.0* 2.6*   No results for input(s): LIPASE, AMYLASE in the last 168 hours. No results for input(s): AMMONIA in the last 168 hours. Coagulation Profile: Recent Labs  Lab 01/09/17 2221 01/10/17 0333  INR 1.49 1.42   Cardiac Enzymes: No results for input(s): CKTOTAL, CKMB, CKMBINDEX, TROPONINI in the last 168 hours. BNP (last 3 results) No results for input(s): PROBNP in the last 8760 hours. HbA1C: No results for input(s): HGBA1C in the last 72 hours. CBG: Recent Labs  Lab 01/11/17 0755  GLUCAP 125*   Lipid Profile: No results for input(s): CHOL, HDL, LDLCALC, TRIG, CHOLHDL, LDLDIRECT in the last 72 hours. Thyroid Function Tests: No results for input(s): TSH, T4TOTAL, FREET4, T3FREE, THYROIDAB in the last 72 hours. Anemia Panel: No results for input(s): VITAMINB12, FOLATE, FERRITIN, TIBC, IRON, RETICCTPCT in the last 72 hours. Sepsis Labs: Recent Labs  Lab 01/09/17 2221 01/10/17 0333  PROCALCITON <0.10  --   LATICACIDVEN 1.5 1.7    Recent Results (from the past 240 hour(s))  Culture, blood (Routine X 2) w Reflex to ID Panel     Status: None (Preliminary result)   Collection Time: 01/09/17 10:35 PM  Result Value Ref Range Status   Specimen Description BLOOD RIGHT HAND  Final   Special Requests IN PEDIATRIC BOTTLE Blood Culture adequate volume  Final   Culture NO GROWTH 2 DAYS  Final   Report Status PENDING  Incomplete  Culture, blood (Routine X 2) w Reflex to ID Panel     Status: None (Preliminary result)   Collection Time: 01/09/17 10:50 PM  Result Value Ref Range Status   Specimen Description BLOOD LEFT HAND  Final   Special Requests IN PEDIATRIC BOTTLE Blood Culture adequate volume  Final   Culture NO GROWTH 2 DAYS  Final   Report Status PENDING  Incomplete  Surgical pcr screen     Status: Abnormal     Collection Time: 01/09/17 11:10 PM  Result Value Ref Range Status   MRSA, PCR NEGATIVE NEGATIVE Final   Staphylococcus aureus POSITIVE (A) NEGATIVE Final    Comment: (NOTE) The Xpert SA Assay (FDA approved for NASAL specimens in patients 50 years of age and older), is one component of a comprehensive surveillance program. It is not intended to  diagnose infection nor to guide or monitor treatment.   Urine Culture     Status: Abnormal   Collection Time: 01/10/17  6:25 AM  Result Value Ref Range Status   Specimen Description URINE, RANDOM  Final   Special Requests NONE  Final   Culture >=100,000 COLONIES/mL KLEBSIELLA OXYTOCA (A)  Final   Report Status 01/13/2017 FINAL  Final   Organism ID, Bacteria KLEBSIELLA OXYTOCA (A)  Final      Susceptibility   Klebsiella oxytoca - MIC*    AMPICILLIN >=32 RESISTANT Resistant     CEFAZOLIN <=4 SENSITIVE Sensitive     CEFTRIAXONE <=1 SENSITIVE Sensitive     CIPROFLOXACIN <=0.25 SENSITIVE Sensitive     GENTAMICIN <=1 SENSITIVE Sensitive     IMIPENEM <=0.25 SENSITIVE Sensitive     NITROFURANTOIN <=16 SENSITIVE Sensitive     TRIMETH/SULFA <=20 SENSITIVE Sensitive     AMPICILLIN/SULBACTAM 8 SENSITIVE Sensitive     PIP/TAZO <=4 SENSITIVE Sensitive     Extended ESBL NEGATIVE Sensitive     * >=100,000 COLONIES/mL KLEBSIELLA OXYTOCA      Radiology Studies: Dg Chest Port 1 View  Result Date: 01/12/2017 CLINICAL DATA:  Pneumothorax EXAM: PORTABLE CHEST 1 VIEW COMPARISON:  Yesterday FINDINGS: Chest tube on the right in stable position. Trace right apical pneumothorax. Cardiopericardial enlargement with a pericardial drain. Left pleural effusion and asymmetric airspace opacity. Right IJ catheter with tip at the upper right atrium. IMPRESSION: 1. Trace right apical pneumothorax. Stable positioning of right chest tube and pericardial drain. 2. Stable cardiopericardial size. 3. Asymmetric left pleural effusion and airspace opacity. Electronically Signed    By: Monte Fantasia M.D.   On: 01/12/2017 08:11      Scheduled Meds: . acetaminophen  1,000 mg Oral Q6H   Or  . acetaminophen (TYLENOL) oral liquid 160 mg/5 mL  1,000 mg Oral Q6H  . bisacodyl  10 mg Oral Daily  . cefdinir  250 mg Oral BID  . clindamycin   Topical BID  . feeding supplement  1 Container Oral TID BM  . fentaNYL   Intravenous Q4H  . furosemide  40 mg Oral Daily  . mupirocin ointment   Nasal BID  . pantoprazole  40 mg Oral Q1200  . senna-docusate  1 tablet Oral QHS   Continuous Infusions: . sodium chloride 75 mL/hr at 01/13/17 0136  . potassium chloride    . sodium chloride       LOS: 4 days    Time spent: Total of 25 minutes spent with pt, greater than 50% of which was spent in discussion of  treatment, counseling and coordination of care   Chipper Oman, MD Pager: Text Page via [email or web address]   If 7PM-7AM, please contact night-coverage [email or web address] 01/13/2017, 8:50 AM

## 2017-01-13 NOTE — Progress Notes (Signed)
I was advised of the patient is admission for the pericardial tamponade.  She is status post subxiphoid pericardial window under the care of Dr. Prescott Gum. Her most recent CT scan of the chest showed improvement in the bilateral pulmonary nodules indicating response to her treatment with GILOTRIF. I would recommend for the patient to resume her treatment with GILOTRIF 40 mg p.o. daily.  She can with her home medication. Please call if you have any questions.

## 2017-01-13 NOTE — Progress Notes (Signed)
Oncology Nurse Navigator Documentation  Oncology Nurse Navigator Flowsheets 01/13/2017  Navigator Location [facility]  Navigator Encounter Type Letter/Fax/Email/help complete letter for fmla extension.  Faxed to Wyatt Mage at [phone] with supportive documentation.   Patient Visit Type Inpatient  Treatment Phase Treatment  Barriers/Navigation Needs Coordination of Care  Interventions Coordination of Care  Coordination of Care Other  Acuity Level 2  Time Spent with Patient 30

## 2017-01-13 NOTE — Progress Notes (Addendum)
      [address]       [place] [address]             [phone]      3 Days Post-Op Procedure(s) (LRB): PERICARDIAL WINDOW- SUB XYPHOID, RIGHT CHEST TUBE (N/A)   Subjective:  Doing okay.  Having some issues with urination as she is not all that comfortable with the wic, as she was wet multiple times yesterday.  She states it was adjusted and she thinks it might be better now.  She also complains of LE edema.    Objective: Vital signs in last 24 hours: Temp:  [97.3 F (36.3 C)-98.2 F (36.8 C)] 97.4 F (36.3 C) (12/18 0741) Pulse Rate:  [77-85] 80 (12/18 0741) Cardiac Rhythm: Normal sinus rhythm (12/18 0741) Resp:  [13-20] 14 (12/18 0741) BP: (97-109)/(64-82) 105/82 (12/18 0741) SpO2:  [94 %-100 %] 96 % (12/18 0741)  Intake/Output from previous day: 12/17 0701 - 12/18 0700 In: 2410 [P.O.:718; I.V.:1575; IV Piggyback:50] Out: 450 [Urine:100; Chest Tube:350]  General appearance: alert, cooperative and no distress Heart: regular rate and rhythm Lungs: diminished breath sounds bilaterally Abdomen: soft, non-tender; bowel sounds normal; no masses,  no organomegaly Extremities: edema 2+ bilaterally, worse in the feet Wound: clean and dry  Lab Results: Recent Labs    01/11/17 0425 01/12/17 0529  WBC 10.9* 18.8*  HGB 9.2* 9.3*  HCT 29.6* 29.8*  PLT 252 248   BMET:  Recent Labs    01/11/17 0425 01/12/17 0529  NA 136 133*  K 3.7 3.7  CL 101 99*  CO2 29 28  GLUCOSE 123* 128*  BUN 8 13  CREATININE 0.98 1.13*  CALCIUM 8.5* 8.6*    PT/INR: No results for input(s): LABPROT, INR in the last 72 hours. ABG    Component Value Date/Time   PHART 7.399 01/11/2017 0422   HCO3 27.3 01/11/2017 0422   TCO2 24 01/10/2017 0234   O2SAT 97.0 01/11/2017 0422   CBG (last 3)  Recent Labs    01/11/17 0755  GLUCAP 125*    Assessment/Plan: S/P Procedure(s) (LRB): PERICARDIAL WINDOW- SUB XYPHOID, RIGHT CHEST TUBE (N/A)  1. Chest tube #1- 150 cc  output, Chest tube #2 190 cc output- no air leak, leave in place today 2. Deconditioning- PT/OT consult placed 3. UTI- continue ABX 4. Dispo-patient stable, leave chest tubes in place today   LOS: 4 days    Ellwood Handler 01/13/2017  Pericardial tissue biopsy negative for cancer pericardial fluid cytology pending Leave pericardial drain today patient examined and medical record reviewed,agree with above note. Tharon Aquas Trigt III 01/13/2017

## 2017-01-13 NOTE — Clinical Social Work Note (Signed)
CSW faxed FMLA paperwork to Edwyna Shell, CSW at the cancer center. Webb Silversmith confirmed she received the fax. Patient updated.  CSW signing off. Consult again if any other social work needs arise.  Dayton Scrape, [place]

## 2017-01-13 NOTE — Progress Notes (Signed)
[place] CSW Progress Note   Spoke w patient who is currently inpatient.  Requested help from [place].  FMLA paperwork faxed to [employer].  Per patient, she needs letter from current MD requesting extension to [facility].  Information provided to [facility], oncology nurse navigator, who will work w MD to provided needed information on patient behalf.  Edwyna Shell, LCSW Clinical Social Worker Phone:  [phone]

## 2017-01-14 ENCOUNTER — Inpatient Hospital Stay (HOSPITAL_COMMUNITY): Payer: 59

## 2017-01-14 DIAGNOSIS — J90 Pleural effusion, not elsewhere classified: Secondary | ICD-10-CM

## 2017-01-14 DIAGNOSIS — E876 Hypokalemia: Secondary | ICD-10-CM

## 2017-01-14 DIAGNOSIS — I313 Pericardial effusion (noninflammatory): Principal | ICD-10-CM

## 2017-01-14 DIAGNOSIS — I10 Essential (primary) hypertension: Secondary | ICD-10-CM

## 2017-01-14 DIAGNOSIS — D72829 Elevated white blood cell count, unspecified: Secondary | ICD-10-CM

## 2017-01-14 DIAGNOSIS — C3492 Malignant neoplasm of unspecified part of left bronchus or lung: Secondary | ICD-10-CM

## 2017-01-14 DIAGNOSIS — D62 Acute posthemorrhagic anemia: Secondary | ICD-10-CM

## 2017-01-14 DIAGNOSIS — R52 Pain, unspecified: Secondary | ICD-10-CM

## 2017-01-14 DIAGNOSIS — Z9689 Presence of other specified functional implants: Secondary | ICD-10-CM

## 2017-01-14 DIAGNOSIS — N390 Urinary tract infection, site not specified: Secondary | ICD-10-CM

## 2017-01-14 LAB — CBC WITH DIFFERENTIAL/PLATELET
Basophils Absolute: 0 10*3/uL (ref 0.0–0.1)
Basophils Relative: 0 %
Eosinophils Absolute: 0.7 10*3/uL (ref 0.0–0.7)
Eosinophils Relative: 5 %
HCT: 33.2 % — ABNORMAL LOW (ref 36.0–46.0)
Hemoglobin: 10.6 g/dL — ABNORMAL LOW (ref 12.0–15.0)
Lymphocytes Relative: 18 %
Lymphs Abs: 2.4 10*3/uL (ref 0.7–4.0)
MCH: 26.1 pg (ref 26.0–34.0)
MCHC: 31.9 g/dL (ref 30.0–36.0)
MCV: 81.8 fL (ref 78.0–100.0)
Monocytes Absolute: 2 10*3/uL — ABNORMAL HIGH (ref 0.1–1.0)
Monocytes Relative: 15 %
Neutro Abs: 7.9 10*3/uL — ABNORMAL HIGH (ref 1.7–7.7)
Neutrophils Relative %: 62 %
Platelets: 282 10*3/uL (ref 150–400)
RBC: 4.06 MIL/uL (ref 3.87–5.11)
RDW: 16.2 % — ABNORMAL HIGH (ref 11.5–15.5)
WBC: 12.9 10*3/uL — ABNORMAL HIGH (ref 4.0–10.5)

## 2017-01-14 LAB — MAGNESIUM: Magnesium: 1.5 mg/dL — ABNORMAL LOW (ref 1.7–2.4)

## 2017-01-14 LAB — BASIC METABOLIC PANEL
Anion gap: 8 (ref 5–15)
BUN: 11 mg/dL (ref 6–20)
CO2: 26 mmol/L (ref 22–32)
Calcium: 8.2 mg/dL — ABNORMAL LOW (ref 8.9–10.3)
Chloride: 101 mmol/L (ref 101–111)
Creatinine, Ser: 0.89 mg/dL (ref 0.44–1.00)
GFR calc Af Amer: >60 mL/min (ref >60)
GFR calc non Af Amer: >60 mL/min (ref >60)
Glucose, Bld: 91 mg/dL (ref 65–99)
Potassium: 3.2 mmol/L — ABNORMAL LOW (ref 3.5–5.1)
Sodium: 135 mmol/L (ref 135–145)

## 2017-01-14 MED ORDER — OXYCODONE HCL 5 MG PO TABS
10.0000 mg | ORAL_TABLET | ORAL | Status: DC | PRN
Start: 2017-01-14 — End: 2017-01-22
  Administered 2017-01-14 – 2017-01-22 (×29): 10 mg via ORAL
  Filled 2017-01-14 (×29): qty 2

## 2017-01-14 MED ORDER — POTASSIUM CHLORIDE 10 MEQ/100ML IV SOLN: 10.0000 meq | INTRAVENOUS | Status: DC

## 2017-01-14 MED ORDER — MAGNESIUM SULFATE 2 GM/50ML IV SOLN
2.0000 g | Freq: Once | INTRAVENOUS | Status: AC
Start: 2017-01-14 — End: 2017-01-14
  Administered 2017-01-14: 2 g via INTRAVENOUS
  Filled 2017-01-14: qty 50

## 2017-01-14 NOTE — Consult Note (Signed)
Physical Medicine and Rehabilitation Consult   Reason for Consult: Functional deficits Referring Physician: Dr. Prescott Gum   HPI: Nancy Moore is a 51 y.o. female with history of HTN, Stage IV lung CA who was admitted on 01/09/17 with progressive SOB due large pleural effusion and massive pericardial effusion with signs of early tamponade. History taken from chart review and patient. She was admitted via cardiology office and Dr. Prescott Gum consulted for input. She was taken to OR on 12/15 for urgent subxiphoid pericardial window and drainage of pleural effusion with placement of right chest tube.  Respiratory status has improved and pain controlled with fentanyl PCA. AKI is resolving and frequency managed with periwick. Klesiella oxytoca UTI treated cefdinir and leucocytosis is resolving. PT evaluations done yesterday revealing deficits in mobility and CIR recommended due functional deficits.   Lives alone but sister has been staying with her and friends check in during the day.     Review of Systems  Constitutional: Negative for chills and fever.  HENT: Negative for hearing loss and tinnitus.   Eyes: Negative for blurred vision and double vision.  Respiratory: Positive for shortness of breath. Negative for cough and wheezing.   Cardiovascular: Negative for chest pain and palpitations.  Gastrointestinal: Positive for constipation and nausea (with chemo med).       Poor appetite  Genitourinary: Negative for dysuria and urgency.  Musculoskeletal: Positive for myalgias.  Neurological: Positive for sensory change and weakness. Negative for focal weakness and headaches.  Psychiatric/Behavioral: Negative for memory loss. The patient is nervous/anxious.   All other systems reviewed and are negative.     Past Medical History:  Diagnosis Date  . Adenocarcinoma of left lung, stage 4 ([place]) 10/28/2016  . Goals of care, counseling/discussion 10/28/2016  . Hypertension     Past Surgical  History:  Procedure Laterality Date  . BREAST EXCISIONAL BIOPSY Left 20+ yrs ago   benign  . PERICARDIAL WINDOW N/A 01/10/2017   Procedure: PERICARDIAL WINDOW- SUB XYPHOID, RIGHT CHEST TUBE;  Surgeon: Ivin Poot, MD;  Location: [facility];  Service: Thoracic;  Laterality: N/A;  . VIDEO BRONCHOSCOPY Bilateral 10/21/2016   Procedure: VIDEO BRONCHOSCOPY WITH FLUORO;  Surgeon: Tanda Rockers, MD;  Location: [facility];  Service: Cardiopulmonary;  Laterality: Bilateral;    Family History  Problem Relation Age of Onset  . Asthma Mother   . Stroke Mother   . Hypertension Mother   . Heart attack Father   . Hypertension Father   . Hyperlipidemia Father   . Dementia Father   . Emphysema Maternal Grandmother   . Hypertension Maternal Grandmother   . Colon cancer Paternal Grandmother     Social History:  Lives alone. 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 She reports that  has never smoked. she has never used smokeless tobacco. She reports that she drinks alcohol. She reports that she does not use drugs.    Allergies  Allergen Reactions  . Percocet [Oxycodone-Acetaminophen] Other (See Comments)    "makes me dizzy"  . Lisinopril Cough  . Losartan   . Valsartan Cough    Medications Prior to Admission  Medication Sig Dispense Refill  . afatinib dimaleate (GILOTRIF) 40 MG tablet Take 1 tablet (40 mg total) by mouth daily. Take on an empty stomach 1hr before or 2 hrs after meals. 30 tablet 1  . clindamycin (CLINDAGEL) 1 % gel Apply topically 2 (two) times  daily. 30 g 1  . loperamide (IMODIUM) 1 MG/5ML solution Take 2 mg as needed by mouth for diarrhea or loose stools.    . prochlorperazine (COMPAZINE) 10 MG tablet Take 1 tablet (10 mg total) every 6 (six) hours as needed by mouth for nausea or vomiting. 30 tablet 1  . furosemide (LASIX) 40 MG tablet Take 1 tablet (40 mg total) by mouth daily. 90 tablet 3    Home: Home Living Family/patient expects to  be discharged to:: Private residence Living Arrangements: Alone Available Help at Discharge: Family, Available 24 hours/day(Sister) Type of Home: House Home Access: Stairs to enter Technical brewer of Steps: 3 Home Layout: One level Home Equipment: None Additional Comments: Sister plans to stay with pt at d/c for 24/7 assist   Functional History: Prior Function Level of Independence: Independent Comments: Wears 2L O2 [place] occasionally with activity Functional Status:  Mobility: Bed Mobility Overal bed mobility: Needs Assistance Bed Mobility: Supine to Sit, Rolling, Sit to Supine Rolling: Min assist Supine to sit: Mod assist, HOB elevated, Min assist Sit to supine: Mod assist General bed mobility comments: MinA for trunk elevation and modA to scoot hips to EOB. ModA to maneuver BLEs returning to supine. ModA+2 to scoot up in bed Transfers Overall transfer level: Needs assistance Equipment used: 4-wheeled walker(Eva walker) Transfers: Sit to/from Stand Sit to Stand: Min assist General transfer comment: Pt insistent on pulling on eva walker to come into standing despite cues for safe hand placement. MinA for balance Ambulation/Gait Ambulation/Gait assistance: Min guard Ambulation Distance (Feet): 40 Feet Assistive device: 4-wheeled walker(Eva walker) Gait Pattern/deviations: Step-through pattern, Decreased stride length General Gait Details: Very slow amb with eva walker and min guard for balance. Pt c/o RLE pain and increased fatigue. Able to take side steps at EOB with no DME and minA for balance Gait velocity: Decreased Gait velocity interpretation: <1.8 ft/sec, indicative of risk for recurrent falls    ADL:    Cognition: Cognition Overall Cognitive Status: Within Functional Limits for tasks assessed Orientation Level: Oriented X4 Cognition Arousal/Alertness: Awake/alert Behavior During Therapy: Flat affect Overall Cognitive Status: Within Functional Limits for  tasks assessed   Blood pressure 101/70, pulse 92, temperature 97.7 F (36.5 C), temperature source Axillary, resp. rate (!) 21, height 5' 6"  (1.676 m), weight 101.9 kg (224 lb 10.4 oz), last menstrual period 03/15/2010, SpO2 98 %. Physical Exam  Nursing note and vitals reviewed. Constitutional: She is oriented to person, place, and time. She appears well-developed and well-nourished. No distress.  HENT:  Head: Normocephalic and atraumatic.  Mouth/Throat: Oropharynx is clear and moist.  Eyes: Conjunctivae and EOM are normal. Pupils are equal, round, and reactive to light.  Neck: Normal range of motion. Neck supple.  Cardiovascular: Normal rate and regular rhythm.  No murmur heard. Respiratory: Effort normal and breath sounds normal. No respiratory distress. She has no wheezes.  +Drains  GI: Soft. Bowel sounds are normal. She exhibits no distension. There is tenderness.  Musculoskeletal: She exhibits edema (3+ edema right antecubital area). She exhibits no tenderness.  Neurological: She is alert and oriented to person, place, and time.  Motor: 4-4+/5 gorssly throughout  Skin: Skin is warm and dry. She is not diaphoretic.  Right neck dressing c/d/i  Psychiatric: She has a normal mood and affect. Her behavior is normal. Judgment and thought content normal.    Results for orders placed or performed during the hospital encounter of 01/09/17 (from the past 24 hour(s))  Basic metabolic panel  Status: Abnormal   Collection Time: 01/14/17  4:02 AM  Result Value Ref Range   Sodium 135 135 - 145 mmol/L   Potassium 3.2 (L) 3.5 - 5.1 mmol/L   Chloride 101 101 - 111 mmol/L   CO2 26 22 - 32 mmol/L   Glucose, Bld 91 65 - 99 mg/dL   BUN 11 6 - 20 mg/dL   Creatinine, Ser 0.89 0.44 - 1.00 mg/dL   Calcium 8.2 (L) 8.9 - 10.3 mg/dL   GFR calc non Af Amer >60 >60 mL/min   GFR calc Af Amer >60 >60 mL/min   Anion gap 8 5 - 15  CBC with Differential/Platelet     Status: Abnormal   Collection Time:  01/14/17  4:02 AM  Result Value Ref Range   WBC 12.9 (H) 4.0 - 10.5 K/uL   RBC 4.06 3.87 - 5.11 MIL/uL   Hemoglobin 10.6 (L) 12.0 - 15.0 g/dL   HCT 33.2 (L) 36.0 - 46.0 %   MCV 81.8 78.0 - 100.0 fL   MCH 26.1 26.0 - 34.0 pg   MCHC 31.9 30.0 - 36.0 g/dL   RDW 16.2 (H) 11.5 - 15.5 %   Platelets 282 150 - 400 K/uL   Neutrophils Relative % 62 %   Neutro Abs 7.9 (H) 1.7 - 7.7 K/uL   Lymphocytes Relative 18 %   Lymphs Abs 2.4 0.7 - 4.0 K/uL   Monocytes Relative 15 %   Monocytes Absolute 2.0 (H) 0.1 - 1.0 K/uL   Eosinophils Relative 5 %   Eosinophils Absolute 0.7 0.0 - 0.7 K/uL   Basophils Relative 0 %   Basophils Absolute 0.0 0.0 - 0.1 K/uL  Magnesium     Status: Abnormal   Collection Time: 01/14/17  4:02 AM  Result Value Ref Range   Magnesium 1.5 (L) 1.7 - 2.4 mg/dL   Dg Chest Port 1 View  Result Date: 01/14/2017 CLINICAL DATA:  Pleural effusion, short of breath EXAM: PORTABLE CHEST 1 VIEW COMPARISON:  01/13/2017 FINDINGS: Right chest tube remains in place. Tiny right apical pneumothorax unchanged. Pericardial drain unchanged position. Central venous catheter tip in the upper right atrium Dense consolidation of the left lung base with associated left effusion unchanged. Improved aeration right lung base which is now nearly clear. IMPRESSION: Tiny right apical pneumothorax unchanged. Dense consolidation left lower lobe with left effusion unchanged. Improved aeration right lung base. Overall improved lung volume since yesterday. Pericardial drain remains in place. Electronically Signed   By: Franchot Gallo M.D.   On: 01/14/2017 06:56   Dg Chest Port 1 View  Result Date: 01/13/2017 CLINICAL DATA:  Chest tubes in place. EXAM: PORTABLE CHEST 1 VIEW COMPARISON:  01/12/2017 FINDINGS: A right jugular catheter terminates at the level of the cavoatrial junction/ high right atrium. A right chest tube and pericardial drain remain in place. The cardiac silhouette remains enlarged. A left pleural  effusion is stable to slightly larger than on the prior study. Left midlung airspace opacity has mildly increased, and there is also new mild right basilar airspace opacity. A trace right apical pneumothorax is similar to the prior study. IMPRESSION: 1. Unchanged trace right apical pneumothorax. 2. Stable to slightly increased size of left pleural effusion. 3. Worsening bilateral airspace disease. Electronically Signed   By: Logan Bores M.D.   On: 01/13/2017 09:19    Assessment/Plan: Diagnosis: Debility Labs independently reviewed.  Records reviewed and summated above.  1. Does the need for close, 24 hr/day medical supervision in  concert with the patient's rehab needs make it unreasonable for this patient to be served in a less intensive setting? Potentially  2. Co-Morbidities requiring supervision/potential complications: leukocytosis (cont to monitor for signs and symptoms of infection, further workup if indicated), UTI (abx), AKI (avoid nephrotoxic meds), pain (Biofeedback training with therapies to help reduce reliance on opiate pain medications, particularly fentanyl PCA, monitor pain control during therapies, and sedation at rest and titrate to maximum efficacy to ensure participation and gains in therapies), HTN (monitor and provide prns in accordance with increased physical exertion and pain), Stage IV lung CA with pleural effusion and massive pericardial effusion with signs of early tamponade, hypokalemia (continue to monitor and replete as necessary), ABLA (transfuse if necessary to ensure appropriate perfusion for increased activity tolerance) 3. Due to bladder management, safety, skin/wound care, disease management, pain management and patient education, does the patient require 24 hr/day rehab nursing? Potentially 4. Does the patient require coordinated care of a physician, rehab nurse, PT (1-2 hrs/day, 5 days/week) and OT (1-2 hrs/day, 5 days/week) to address physical and functional deficits  in the context of the above medical diagnosis(es)? Potentially Addressing deficits in the following areas: balance, endurance, locomotion, strength, transferring, bathing, dressing, toileting and psychosocial support 5. Can the patient actively participate in an intensive therapy program of at least 3 hrs of therapy per day at least 5 days per week? Yes 6. The potential for patient to make measurable gains while on inpatient rehab is excellent and good 7. Anticipated functional outcomes upon discharge from inpatient rehab are modified independent  with PT, modified independent and supervision with OT, n/a with SLP. 8. Estimated rehab length of stay to reach the above functional goals is: 6-9 days. 9. Anticipated D/C setting: Home 10. Anticipated post D/C treatments: HH therapy and Home excercise program 11. Overall Rehab/Functional Prognosis: good  RECOMMENDATIONS: This patient's condition is appropriate for continued rehabilitative care in the following setting: Pt did well on day of eval.  Anticipate she will not require CIR once medically stable for discharge.  At this time, recommend home with [facility] when medically appropriate. Patient has agreed to participate in recommended program. Potentially Note that insurance prior authorization may be required for reimbursement for recommended care.  Comment: Rehab Admissions Coordinator to follow up.  Delice Lesch, MD, ABPMR [place], [place] 01/14/2017

## 2017-01-14 NOTE — Progress Notes (Signed)
Visited with patient and provided Advanced Directive paperwork.  Patient is processing all that has happened in her life with her health since being diagnosed in September.  We talked about diagnosis, family, church family and prayed together.  She has support in her sister but other family members are kind of slow to come around.  Will provided follow up.  She will talk with her sister about Advanced Directive and her wishes and will have [place] to come back and provide notarizing.     01/14/17 0959  Clinical Encounter Type  Visited With Patient  Visit Type Initial;Psychological support;Spiritual support  Spiritual Encounters  Spiritual Needs Prayer;Literature (Advanced Directive)

## 2017-01-14 NOTE — Evaluation (Signed)
Occupational Therapy Evaluation Patient Details Name: Nancy Moore MRN: [id number] DOB: 09-29-1965 Today's Date: 01/14/2017    History of Present Illness Pt is a 51 y.o. female with PMH significant for stage IVlung adenocarcinoma and HTN, who presented to ED on 01/09/17 with worsening SOB. Pt recently had a re-staging chest CT which showed a large, complex pericardial effusion and large right pleural effusion. Now s/p subxyphoid pericardial window and R chest tube placement on 12/15.    Clinical Impression   PTA, pt reports independence with basic ADL and functional mobility. She has good family support per her report. Pt currently requires min assist for stand-pivot toilet transfers and maximum assist for LB ADL. She presents with decreased activity tolerance for ADL and generalized weakness impacting her ability to participate in ADL at [facility]. She demonstrates significant functional decline and is highly motivated to return to independence. Pt is a good candidate for CIR level rehabilitation post-acute D/C to maximize return to functional independence.     Follow Up Recommendations  CIR;Supervision/Assistance - 24 hour    Equipment Recommendations  Other (comment)(defer to next venue of care)    Recommendations for Other Services Rehab consult     Precautions / Restrictions Precautions Precautions: Fall Precaution Comments: 2x chest tubes, PCA pump Restrictions Weight Bearing Restrictions: No      Mobility Bed Mobility Overal bed mobility: Needs Assistance Bed Mobility: Rolling;Sidelying to Sit Rolling: Min assist Sidelying to sit: Mod assist       General bed mobility comments: Assist to elevate trunk and scoot hips to EOB.   Transfers Overall transfer level: Needs assistance Equipment used: 1 person hand held assist Transfers: Sit to/from [facility] Sit to Stand: Min assist Stand pivot transfers: Min assist       General transfer comment: Min  assist to rise into standing and for stability.     Balance Overall balance assessment: Needs assistance Sitting-balance support: No upper extremity supported;Feet supported Sitting balance-Leahy Scale: Fair     Standing balance support: No upper extremity supported;Single extremity supported;During functional activity Standing balance-Leahy Scale: Fair Standing balance comment: Able to static stand with no UE support                           ADL either performed or assessed with clinical judgement   ADL Overall ADL's : Needs assistance/impaired Eating/Feeding: Set up;Sitting   Grooming: Set up;Sitting   Upper Body Bathing: Minimal assistance;Sitting   Lower Body Bathing: Sit to/from stand;Maximal assistance   Upper Body Dressing : Minimal assistance;Sitting   Lower Body Dressing: Maximal assistance;Sit to/from stand   Toilet Transfer: Minimal assistance;Stand-pivot;BSC   Toileting- Clothing Manipulation and Hygiene: Minimal assistance;Sit to/from stand       Functional mobility during ADLs: Minimal assistance(stand-pivot only) General ADL Comments: Pt able to complete toileting tasks at [facility] level this session.      Vision Patient Visual Report: No change from baseline Vision Assessment?: No apparent visual deficits     Perception     Praxis      Pertinent Vitals/Pain Pain Assessment: Faces Faces Pain Scale: Hurts little more Pain Location: Abdomen Pain Descriptors / Indicators: Aching;Grimacing Pain Intervention(s): Limited activity within patient's tolerance;Monitored during session     Hand Dominance     Extremity/Trunk Assessment Upper Extremity Assessment Upper Extremity Assessment: Generalized weakness   Lower Extremity Assessment Lower Extremity Assessment: Generalized weakness       Communication Communication Communication: No difficulties  Cognition Arousal/Alertness: Awake/alert Behavior During Therapy: Flat affect Overall  Cognitive Status: Within Functional Limits for tasks assessed                                     General Comments       Exercises     Shoulder Instructions      Home Living Family/patient expects to be discharged to:: Private residence Living Arrangements: Alone Available Help at Discharge: Family;Available 24 hours/day(sister) Type of Home: House Home Access: Stairs to enter [employer] of Steps: 3   Home Layout: One level     Bathroom Shower/Tub: Tub/shower unit(has tub shower but does wash-ups)         Home Equipment: None   Additional Comments: Sister plans to stay with pt at d/c for 24/7 assist       Prior Functioning/Environment Level of Independence: Independent        Comments: Wears 2L O2 [place] occasionally with activity        OT Problem List: Decreased strength;Decreased range of motion;Decreased activity tolerance;Impaired balance (sitting and/or standing);Decreased safety awareness;Decreased knowledge of use of DME or AE;Decreased knowledge of precautions;Pain      OT Treatment/Interventions: Self-care/ADL training;Therapeutic exercise;Energy conservation;DME and/or AE instruction;Therapeutic activities;Patient/family education;Balance training    OT Goals(Current goals can be found in the care plan section) Acute Rehab OT Goals Patient Stated Goal: Get stronger at rehab before return home OT Goal Formulation: With patient Time For Goal Achievement: 01/28/17 Potential to Achieve Goals: Good ADL Goals Pt Will Perform Grooming: with supervision;standing Pt Will Perform Upper Body Dressing: with supervision;sitting Pt Will Perform Lower Body Dressing: with min guard assist;sit to/from stand Pt Will Transfer to Toilet: with supervision;ambulating;bedside commode Pt Will Perform Toileting - Clothing Manipulation and hygiene: with supervision;sit to/from stand Pt/caregiver will Perform Home Exercise Program: Increased  strength;Both right and left upper extremity;With written HEP provided  OT Frequency: Min 2X/week   Barriers to D/C:            Co-evaluation              AM-PAC PT "6 Clicks" Daily Activity     Outcome Measure Help from another person eating meals?: A Little Help from another person taking care of personal grooming?: A Little Help from another person toileting, which includes using toliet, bedpan, or urinal?: A Little Help from another person bathing (including washing, rinsing, drying)?: A Lot Help from another person to put on and taking off regular upper body clothing?: A Little Help from another person to put on and taking off regular lower body clothing?: A Lot 6 Click Score: 16   End of Session Nurse Communication: Mobility status  Activity Tolerance: Patient tolerated treatment well Patient left: in chair;with call bell/phone within reach  OT Visit Diagnosis: Other abnormalities of gait and mobility (R26.89);Muscle weakness (generalized) (M62.81)                Time: 9211-9417 OT Time Calculation (min): 53 min Charges:  OT General Charges $OT Visit: 1 Visit OT Evaluation $OT Eval Moderate Complexity: 1 Mod OT Treatments $Self Care/Home Management : 38-52 mins G-Codes:     Norman Herrlich, MS OTR/L  Pager: Holiday A Aleysha Meckler 01/14/2017, 3:50 PM

## 2017-01-14 NOTE — Care Management Note (Signed)
Case Management Note  Patient Details  Name: Nancy Moore MRN: [id number] Date of Birth: Nov 21, 1965  Subjective/Objective:    Pt admitted with malignant pleural effusions  - pt has stage IV Lung CA              Action/Plan:   PTA independent from home.  CM requested Palliative Consult for Goals of Care - including recommendations for community programs offering palliative and or hospice.  Pt will need PT eval once medically stable from cardiothoracic standpoint.     Expected Discharge Date:  01/15/17               Expected Discharge Plan:  Home w Hospice Care  In-House Referral:  Clinical Social Work  Discharge planning Services  CM Consult  Post Acute Care Choice:    Choice offered to:     DME Arranged:    DME Agency:     HH Arranged:    HH Agency:     Status of Service:     If discussed at [name], dates discussed:    Additional Comments: 01/14/2017 CIR recommended - CM requested CIR consult from attending - CSW consulted for back up plan with SNF Maryclare Labrador, RN 01/14/2017, 9:07 AM

## 2017-01-14 NOTE — Progress Notes (Addendum)
      [address]       [place] [address]             [phone]      4 Days Post-Op Procedure(s) (LRB): PERICARDIAL WINDOW- SUB XYPHOID, RIGHT CHEST TUBE (N/A)   Subjective:  Doing better today.  She has no new complaints.  She was able to walk with PT yesterday and states it felt good to get out of bed.  She has been able to urinate without difficulties and the wic isn't leaking.  Objective: Vital signs in last 24 hours: Temp:  [97.5 F (36.4 C)-98.3 F (36.8 C)] 97.7 F (36.5 C) (12/19 0723) Pulse Rate:  [80-92] 92 (12/19 0303) Cardiac Rhythm: Normal sinus rhythm (12/19 0700) Resp:  [10-21] 21 (12/19 0809) BP: (96-114)/(63-87) 101/70 (12/19 0303) SpO2:  [94 %-100 %] 98 % (12/19 0809) Weight:  [224 lb 10.4 oz (101.9 kg)] 224 lb 10.4 oz (101.9 kg) (12/19 0303)  Intake/Output from previous day: 12/18 0701 - 12/19 0700 In: 2303.6 [P.O.:894; I.V.:1409.6] Out: 1551 [Urine:1100; Stool:1; Chest Tube:450]  General appearance: alert, cooperative and no distress Heart: regular rate and rhythm Lungs: clear to auscultation bilaterally Abdomen: soft, non-tender; bowel sounds normal; no masses,  no organomegaly Extremities: edema 1+ worse in feet Wound: clean and dry  Lab Results: Recent Labs    01/13/17 0847 01/14/17 0402  WBC 16.4* 12.9*  HGB 10.3* 10.6*  HCT 32.2* 33.2*  PLT 266 282   BMET:  Recent Labs    01/13/17 0847 01/14/17 0402  NA 134* 135  K 3.7 3.2*  CL 102 101  CO2 26 26  GLUCOSE 85 91  BUN 13 11  CREATININE 0.93 0.89  CALCIUM 8.5* 8.2*    PT/INR: No results for input(s): LABPROT, INR in the last 72 hours. ABG    Component Value Date/Time   PHART 7.399 01/11/2017 0422   HCO3 27.3 01/11/2017 0422   TCO2 24 01/10/2017 0234   O2SAT 97.0 01/11/2017 0422   CBG (last 3)  No results for input(s): GLUCAP in the last 72 hours.  Assessment/Plan: S/P Procedure(s) (LRB): PERICARDIAL WINDOW- SUB XYPHOID, RIGHT CHEST TUBE  (N/A)  1. Chest tube #1 150, #2 300- leave both chest tubes in place today 2. Deconditioning- continue PT/OT 3. Lung Cancer- patient is showing response to treatment with most recent CT scan, to resume home chemotherapy pill per Dr. Julien Nordmann 4. Dispo- patient stable, will place chest tube to water seal today, leave in place   LOS: 5 days    Ellwood Handler 01/14/2017   Both pericardial and right pleural fluid contained malignant cells When right pleural tube is ready for removal will plan talc pleurodesis before tube is removed.  patient examined and medical record reviewed,agree with above note. Tharon Aquas Trigt III 01/14/2017

## 2017-01-14 NOTE — Progress Notes (Signed)
Physical Therapy Treatment Patient Details Name: Nancy Moore MRN: [id number] DOB: 1965-04-07 Today's Date: 01/14/2017    History of Present Illness Pt is a 51 y.o. female with PMH significant for stage IVlung adenocarcinoma and HTN, who presented to ED on 01/09/17 with worsening SOB. Pt recently had a re-staging chest CT which showed a large, complex pericardial effusion and large right pleural effusion. Now s/p subxyphoid pericardial window and R chest tube placement on 12/15.     PT Comments    Pt performed increased activity and gait during session.  Pt remains guarded due to pain but motivated to progress.  Pt used rollator during session requiring cues for safety with locking brakes during transfer activities.  Pt remains to benefit from rehab in a post acute setting to improve strength and functional mobility before returning home.    Follow Up Recommendations  CIR     Equipment Recommendations  Other (comment)(TBD at next venue)    Recommendations for Other Services OT consult     Precautions / Restrictions Precautions Precautions: Fall Precaution Comments: 2x chest tubes, PCA pump Restrictions Weight Bearing Restrictions: No    Mobility  Bed Mobility Overal bed mobility: Needs Assistance Bed Mobility: Sit to Supine Rolling: Min assist Sidelying to sit: Mod assist   Sit to supine: Mod assist   General bed mobility comments: Assist to lower trunk and lift limbs against gravity back into bed.    Transfers Overall transfer level: Needs assistance Equipment used: 4-wheeled walker(rollator) Transfers: Sit to/from Stand Sit to Stand: Min assist Stand pivot transfers: Min assist       General transfer comment: Min assist to rise into standing and for stability.   Ambulation/Gait Ambulation/Gait assistance: Min guard Ambulation Distance (Feet): 200 Feet(standing rest breaks x2 due to elevated HR.  ) Assistive device: 4-wheeled walker(rollator) Gait  Pattern/deviations: Step-through pattern;Decreased stride length Gait velocity: Decreased Gait velocity interpretation: Below normal speed for age/gender General Gait Details: Very slow amb with rollator and min guard for balance. Pt c/o RLE pain and increased fatigue. Cues for pacing HR elevated to 120s, cues for forward gaze and Rollator safety.     Stairs            Wheelchair Mobility    Modified Rankin (Stroke Patients Only)       Balance Overall balance assessment: Needs assistance Sitting-balance support: No upper extremity supported;Feet supported Sitting balance-Leahy Scale: Fair     Standing balance support: No upper extremity supported;Single extremity supported;During functional activity Standing balance-Leahy Scale: Fair Standing balance comment: Able to static stand with no UE support                            Cognition Arousal/Alertness: Awake/alert Behavior During Therapy: Flat affect Overall Cognitive Status: Within Functional Limits for tasks assessed                                        Exercises      General Comments        Pertinent Vitals/Pain Pain Assessment: Faces Faces Pain Scale: Hurts little more Pain Location: chest tube site Pain Descriptors / Indicators: Aching;Grimacing Pain Intervention(s): Monitored during session;Repositioned;Ice applied    Home Living Family/patient expects to be discharged to:: Private residence Living Arrangements: Alone Available Help at Discharge: Family;Available 24 hours/day(sister) Type of Home: House Home Access: Stairs  to enter   Home Layout: One level Home Equipment: None Additional Comments: Sister plans to stay with pt at d/c for 24/7 assist     Prior Function Level of Independence: Independent      Comments: Wears 2L O2 [place] occasionally with activity   PT Goals (current goals can now be found in the care plan section) Acute Rehab PT Goals Patient Stated  Goal: Get stronger at rehab before return home Potential to Achieve Goals: Good Progress towards PT goals: Progressing toward goals    Frequency    Min 3X/week      PT Plan Current plan remains appropriate    Co-evaluation              AM-PAC PT "6 Clicks" Daily Activity  Outcome Measure  Difficulty turning over in bed (including adjusting bedclothes, sheets and blankets)?: Unable Difficulty moving from lying on back to sitting on the side of the bed? : Unable Difficulty sitting down on and standing up from a chair with arms (e.g., wheelchair, bedside commode, etc,.)?: Unable Help needed moving to and from a bed to chair (including a wheelchair)?: A Little Help needed walking in hospital room?: A Little Help needed climbing 3-5 steps with a railing? : A Lot 6 Click Score: 11    End of Session Equipment Utilized During Treatment: Gait belt Activity Tolerance: Patient tolerated treatment well;Patient limited by fatigue Patient left: in bed;with call bell/phone within reach;with family/visitor present Nurse Communication: Mobility status PT Visit Diagnosis: Other abnormalities of gait and mobility (R26.89);Muscle weakness (generalized) (M62.81)     Time: 3893-7342 PT Time Calculation (min) (ACUTE ONLY): 49 min  Charges:  $Gait Training: 8-22 mins $Therapeutic Activity: 23-37 mins                    G CodesGovernor Rooks, PTA pager [phone]    Cristela Blue 01/14/2017, 5:37 PM

## 2017-01-14 NOTE — Progress Notes (Addendum)
PROGRESS NOTE [facility]   Natacha Jepsen Jarvis   ESP:[id number] DOB: May 09, 1965  DOA: 01/09/2017 PCP: Darreld Mclean, MD   Brief Narrative:  Nancy Moore is a 51 year old female with medical history significant for stage IV lung cancer, hypertension who presented to the emergency department complaining of severe shortness of breath. She recently had a re-staging CT scan of her chest which showed a large, complex pericardial effusion as well as a large right pleural effusion. An urgent referral to cardiology was done and Dr. Debara Pickett  performed a 2D Echo, which showed massive pericardial effusion with signs of early tamponade. She was sent to ED further evaluation and treatment.  She is admitted pericardial effusion with early tamponade underwent pericardial window with R chest tube placement.   Subjective: Patient continues to improve. Have no complaints today. Worked with PT recommending CIR. No acute events overnight. Drain output decreasing.   Assessment & Plan: Pericardial effusion and pleural effusion Likely secondary to malignancy,  S/p pericardial window and R chest tube - Good out put in both drains . Pathology, pericardial biopsy negative for malignancy, Fluid cytology pending  Continue management per TCTS    Adenocarcinoma of left lung, stage IV Patient followed by Dr. Earlie Server as an outpatient Continue Afatinib Oncology recommending to continue GILOTRIF as well   Hypertension Continue to be stable  Continue current meds   Hypokalemia/HypoMag+  Replete  Check BMP and Mag in AM   Acute renal failure - Resolved  Likely due to Lasix  Lasix was d/ced   Can d/c  IVF for now  Encourage oral hydration   UTI - Klebsiella - causing leukocytosis  Patient report increase in frequency  Patient was on Augmentin for possible PNA will switch to [facility] for better sensitivity, as patients in immunocompetent need to be more aggressive - treatment for total of 7 days WBC  improving, afebrile   General deconditioning  PT recommending CIR   DVT prophylaxis: TED Hose, SCD's  Code Status: Full code Family Communication: Family at bedside Disposition Plan: When clear by TCTS  Consultants:   TCTS - Dr Prescott Gum   Procedures:   None  Antimicrobials:  None   Objective: Vitals:   01/14/17 0303 01/14/17 0401 01/14/17 0723 01/14/17 0809  BP: 101/70     Pulse: 92     Resp: 16 17  (!) 21  Temp: 97.6 F (36.4 C)  97.7 F (36.5 C)   TempSrc: Oral  Axillary   SpO2: 96% 99%  98%  Weight: 101.9 kg (224 lb 10.4 oz)     Height:        Intake/Output Summary (Last 24 hours) at 01/14/2017 0845 Last data filed at 01/14/2017 0400 Gross per 24 hour  Intake 2063.58 ml  Output 1551 ml  Net 512.58 ml   Filed Weights   01/09/17 1508 01/14/17 0303  Weight: 102.5 kg (226 lb) 101.9 kg (224 lb 10.4 oz)    Examination:  General: NAD  Cardiovascular: S1S2 RRR no murmus  Respiratory: Diminished b/l, no wheezing  Abdominal: Soft, NT, ND, + bowel sounds Extremities: no edema  Data Reviewed: I have personally reviewed following labs and imaging studies  CBC: Recent Labs  Lab 01/08/17 1055  01/10/17 0333 01/11/17 0425 01/12/17 0529 01/13/17 0847 01/14/17 0402  WBC 10.7*   < > 13.2* 10.9* 18.8* 16.4* 12.9*  NEUTROABS 7.6*  --   --   --  14.7* 11.1* 7.9*  HGB 10.2*   < > 10.7*  9.2* 9.3* 10.3* 10.6*  HCT 31.8*   < > 32.3* 29.6* 29.8* 32.2* 33.2*  MCV 83.8   < > 82.6 83.4 83.0 82.4 81.8  PLT 268   < > 293 252 248 266 282   < > = values in this interval not displayed.   Basic Metabolic Panel: Recent Labs  Lab 01/09/17 2221 01/10/17 0333 01/11/17 0425 01/11/17 0930 01/12/17 0529 01/13/17 0847 01/14/17 0402  NA  --  132* 136  --  133* 134* 135  K  --  3.2* 3.7  --  3.7 3.7 3.2*  CL  --  95* 101  --  99* 102 101  CO2  --  26 29  --  28 26 26   GLUCOSE  --  101* 123*  --  128* 85 91  BUN  --  14 8  --  13 13 11   CREATININE  --  1.26* 0.98  --   1.13* 0.93 0.89  CALCIUM  --  9.2 8.5*  --  8.6* 8.5* 8.2*  MG 2.1  --   --  1.6*  --  1.7 1.5*   GFR: Estimated Creatinine Clearance: 91.1 mL/min (by C-G formula based on SCr of 0.89 mg/dL). Liver Function Tests: Recent Labs  Lab 01/08/17 1055 01/10/17 0333 01/11/17 0930 01/12/17 0529  AST 31 33  --  23  ALT 34 34  --  22  ALKPHOS 82 81  --  57  BILITOT 1.74* 1.6*  --  1.3*  PROT 7.1 6.8  --  5.2*  ALBUMIN 3.6 3.5 3.0* 2.6*   No results for input(s): LIPASE, AMYLASE in the last 168 hours. No results for input(s): AMMONIA in the last 168 hours. Coagulation Profile: Recent Labs  Lab 01/09/17 2221 01/10/17 0333  INR 1.49 1.42   Cardiac Enzymes: No results for input(s): CKTOTAL, CKMB, CKMBINDEX, TROPONINI in the last 168 hours. BNP (last 3 results) No results for input(s): PROBNP in the last 8760 hours. HbA1C: No results for input(s): HGBA1C in the last 72 hours. CBG: Recent Labs  Lab 01/11/17 0755  GLUCAP 125*   Lipid Profile: No results for input(s): CHOL, HDL, LDLCALC, TRIG, CHOLHDL, LDLDIRECT in the last 72 hours. Thyroid Function Tests: No results for input(s): TSH, T4TOTAL, FREET4, T3FREE, THYROIDAB in the last 72 hours. Anemia Panel: No results for input(s): VITAMINB12, FOLATE, FERRITIN, TIBC, IRON, RETICCTPCT in the last 72 hours. Sepsis Labs: Recent Labs  Lab 01/09/17 2221 01/10/17 0333  PROCALCITON <0.10  --   LATICACIDVEN 1.5 1.7    Recent Results (from the past 240 hour(s))  Culture, blood (Routine X 2) w Reflex to ID Panel     Status: None (Preliminary result)   Collection Time: 01/09/17 10:35 PM  Result Value Ref Range Status   Specimen Description BLOOD RIGHT HAND  Final   Special Requests IN PEDIATRIC BOTTLE Blood Culture adequate volume  Final   Culture NO GROWTH 3 DAYS  Final   Report Status PENDING  Incomplete  Culture, blood (Routine X 2) w Reflex to ID Panel     Status: None (Preliminary result)   Collection Time: 01/09/17 10:50 PM    Result Value Ref Range Status   Specimen Description BLOOD LEFT HAND  Final   Special Requests IN PEDIATRIC BOTTLE Blood Culture adequate volume  Final   Culture NO GROWTH 3 DAYS  Final   Report Status PENDING  Incomplete  Surgical pcr screen     Status: Abnormal   Collection Time:  01/09/17 11:10 PM  Result Value Ref Range Status   MRSA, PCR NEGATIVE NEGATIVE Final   Staphylococcus aureus POSITIVE (A) NEGATIVE Final    Comment: (NOTE) The Xpert SA Assay (FDA approved for NASAL specimens in patients 58 years of age and older), is one component of a comprehensive surveillance program. It is not intended to diagnose infection nor to guide or monitor treatment.   Urine Culture     Status: Abnormal   Collection Time: 01/10/17  6:25 AM  Result Value Ref Range Status   Specimen Description URINE, RANDOM  Final   Special Requests NONE  Final   Culture >=100,000 COLONIES/mL KLEBSIELLA OXYTOCA (A)  Final   Report Status 01/13/2017 FINAL  Final   Organism ID, Bacteria KLEBSIELLA OXYTOCA (A)  Final      Susceptibility   Klebsiella oxytoca - MIC*    AMPICILLIN >=32 RESISTANT Resistant     CEFAZOLIN <=4 SENSITIVE Sensitive     CEFTRIAXONE <=1 SENSITIVE Sensitive     CIPROFLOXACIN <=0.25 SENSITIVE Sensitive     GENTAMICIN <=1 SENSITIVE Sensitive     IMIPENEM <=0.25 SENSITIVE Sensitive     NITROFURANTOIN <=16 SENSITIVE Sensitive     TRIMETH/SULFA <=20 SENSITIVE Sensitive     AMPICILLIN/SULBACTAM 8 SENSITIVE Sensitive     PIP/TAZO <=4 SENSITIVE Sensitive     Extended ESBL NEGATIVE Sensitive     * >=100,000 COLONIES/mL KLEBSIELLA OXYTOCA      Radiology Studies: Dg Chest Port 1 View  Result Date: 01/14/2017 CLINICAL DATA:  Pleural effusion, short of breath EXAM: PORTABLE CHEST 1 VIEW COMPARISON:  01/13/2017 FINDINGS: Right chest tube remains in place. Tiny right apical pneumothorax unchanged. Pericardial drain unchanged position. Central venous catheter tip in the upper right atrium Dense  consolidation of the left lung base with associated left effusion unchanged. Improved aeration right lung base which is now nearly clear. IMPRESSION: Tiny right apical pneumothorax unchanged. Dense consolidation left lower lobe with left effusion unchanged. Improved aeration right lung base. Overall improved lung volume since yesterday. Pericardial drain remains in place. Electronically Signed   By: Franchot Gallo M.D.   On: 01/14/2017 06:56   Dg Chest Port 1 View  Result Date: 01/13/2017 CLINICAL DATA:  Chest tubes in place. EXAM: PORTABLE CHEST 1 VIEW COMPARISON:  01/12/2017 FINDINGS: A right jugular catheter terminates at the level of the cavoatrial junction/ high right atrium. A right chest tube and pericardial drain remain in place. The cardiac silhouette remains enlarged. A left pleural effusion is stable to slightly larger than on the prior study. Left midlung airspace opacity has mildly increased, and there is also new mild right basilar airspace opacity. A trace right apical pneumothorax is similar to the prior study. IMPRESSION: 1. Unchanged trace right apical pneumothorax. 2. Stable to slightly increased size of left pleural effusion. 3. Worsening bilateral airspace disease. Electronically Signed   By: Logan Bores M.D.   On: 01/13/2017 09:19    Scheduled Meds: . acetaminophen  1,000 mg Oral Q6H   Or  . acetaminophen (TYLENOL) oral liquid 160 mg/5 mL  1,000 mg Oral Q6H  . afatinib dimaleate  40 mg Oral Daily  . bisacodyl  10 mg Oral Daily  . cefdinir  250 mg Oral BID  . clindamycin   Topical BID  . feeding supplement  1 Container Oral TID BM  . fentaNYL   Intravenous Q4H  . furosemide  40 mg Oral Daily  . mupirocin ointment   Nasal BID  . pantoprazole  40 mg Oral Q1200  . senna-docusate  1 tablet Oral QHS   Continuous Infusions: . sodium chloride 50 mL/hr at 01/13/17 2048  . magnesium sulfate 1 - 4 g bolus IVPB    . potassium chloride 10 mEq (01/14/17 0805)  . sodium chloride         LOS: 5 days    Time spent: Total of 25 minutes spent with pt, greater than 50% of which was spent in discussion of  treatment, counseling and coordination of care   Chipper Oman, MD Pager: Text Page via [email or web address]   If 7PM-7AM, please contact night-coverage [email or web address] 01/14/2017, 8:45 AM

## 2017-01-15 ENCOUNTER — Ambulatory Visit: Payer: 59 | Admitting: Internal Medicine

## 2017-01-15 ENCOUNTER — Inpatient Hospital Stay (HOSPITAL_COMMUNITY): Payer: 59

## 2017-01-15 ENCOUNTER — Ambulatory Visit: Payer: 59 | Admitting: Oncology

## 2017-01-15 LAB — BASIC METABOLIC PANEL
Anion gap: 4 — ABNORMAL LOW (ref 5–15)
BUN: 6 mg/dL (ref 6–20)
CO2: 27 mmol/L (ref 22–32)
Calcium: 8 mg/dL — ABNORMAL LOW (ref 8.9–10.3)
Chloride: 102 mmol/L (ref 101–111)
Creatinine, Ser: 0.79 mg/dL (ref 0.44–1.00)
GFR calc Af Amer: >60 mL/min (ref >60)
GFR calc non Af Amer: >60 mL/min (ref >60)
Glucose, Bld: 102 mg/dL — ABNORMAL HIGH (ref 65–99)
Potassium: 3.2 mmol/L — ABNORMAL LOW (ref 3.5–5.1)
Sodium: 133 mmol/L — ABNORMAL LOW (ref 135–145)

## 2017-01-15 LAB — CULTURE, BLOOD (ROUTINE X 2)
Culture: NO GROWTH
Culture: NO GROWTH
Special Requests: ADEQUATE
Special Requests: ADEQUATE

## 2017-01-15 LAB — CBC WITH DIFFERENTIAL/PLATELET
Basophils Absolute: 0 10*3/uL (ref 0.0–0.1)
Basophils Relative: 0 %
Eosinophils Absolute: 0.9 10*3/uL — ABNORMAL HIGH (ref 0.0–0.7)
Eosinophils Relative: 7 %
HCT: 34.1 % — ABNORMAL LOW (ref 36.0–46.0)
Hemoglobin: 10.8 g/dL — ABNORMAL LOW (ref 12.0–15.0)
Lymphocytes Relative: 17 %
Lymphs Abs: 2.3 10*3/uL (ref 0.7–4.0)
MCH: 26.2 pg (ref 26.0–34.0)
MCHC: 31.7 g/dL (ref 30.0–36.0)
MCV: 82.8 fL (ref 78.0–100.0)
Monocytes Absolute: 1.5 10*3/uL — ABNORMAL HIGH (ref 0.1–1.0)
Monocytes Relative: 11 %
Neutro Abs: 8.7 10*3/uL — ABNORMAL HIGH (ref 1.7–7.7)
Neutrophils Relative %: 65 %
Platelets: 302 10*3/uL (ref 150–400)
RBC: 4.12 MIL/uL (ref 3.87–5.11)
RDW: 16.9 % — ABNORMAL HIGH (ref 11.5–15.5)
WBC: 13.4 10*3/uL — ABNORMAL HIGH (ref 4.0–10.5)

## 2017-01-15 LAB — MAGNESIUM: Magnesium: 1.5 mg/dL — ABNORMAL LOW (ref 1.7–2.4)

## 2017-01-15 MED ORDER — AMMONIUM LACTATE 12 % EX LOTN
TOPICAL_LOTION | CUTANEOUS | Status: DC | PRN
  Filled 2017-01-15 (×2): qty 400

## 2017-01-15 MED ORDER — POTASSIUM CHLORIDE 10 MEQ/100ML IV SOLN
10.0000 meq | INTRAVENOUS | Status: AC
  Administered 2017-01-15 (×3): 10 meq via INTRAVENOUS
  Filled 2017-01-15 (×2): qty 100

## 2017-01-15 MED ORDER — MAGNESIUM SULFATE 4 GM/100ML IV SOLN
4.0000 g | Freq: Once | INTRAVENOUS | Status: AC
  Administered 2017-01-15: 4 g via INTRAVENOUS
  Filled 2017-01-15: qty 100

## 2017-01-15 MED ORDER — POTASSIUM CHLORIDE 10 MEQ/100ML IV SOLN
INTRAVENOUS | Status: AC
  Administered 2017-01-15: 10 meq via INTRAVENOUS
  Filled 2017-01-15: qty 100

## 2017-01-15 NOTE — Progress Notes (Signed)
Inpatient Rehabilitation  Note recommendations from Palliative Care consult.  I plan to meet with the patient tomorrow to discuss the IP Rehab program.  Plan to update team after.  Call if questions.   Carmelia Roller., CCC/SLP Admission Coordinator  [place]  Cell [phone]

## 2017-01-15 NOTE — Progress Notes (Signed)
Nutrition Follow-up  DOCUMENTATION CODES:   Obesity unspecified  INTERVENTION:   -Continue Boost Breeze po TID, each supplement provides 250 kcal and 9 grams of protein  NUTRITION DIAGNOSIS:   Increased nutrient needs related to cancer and cancer related treatments as evidenced by estimated needs.  Ongoing  GOAL:   Patient will meet greater than or equal to 90% of their needs  Progressing  MONITOR:   PO intake, Supplement acceptance, Labs, Weight trends, Skin  REASON FOR ASSESSMENT:   Malnutrition Screening Tool    ASSESSMENT:   51 yo Female with PMH significant for stage IV lung cancer, hypertension who presented to the emergency department complaining of severe shortness of breath. She recently had a re-staging CT scan of her chest which showed a large, complex pericardial effusion as well as a large R pleural effusion.  Pt s/p procedure 12/15: PERICARDIAL WINDOW- SUB XYPHOID, RIGHT CHEST TUBE   Pt resting in room with lights off at time of visit.   Appetite remains fair to poor; meal completion 10-50%. Pt is consuming Boost Breeze supplements, however, refused this morning's dose.   Per CVTS notes, plan to remove pericardial drain today.   Palliative Care following for goals of care. CIR also following for potential admission.   Labs reviewed: Na: 132, K: 3.2, Mg: 1.5 (on IV supplementation).   Diet Order:  Diet regular Room service appropriate? Yes; Fluid consistency: Thin  EDUCATION NEEDS:   No education needs have been identified at this time  Skin:  Skin Assessment: Skin Integrity Issues: Skin Integrity Issues:: Incisions Incisions: closed chest x2  Last BM:  01/13/18  Height:   Ht Readings from Last 1 Encounters:  01/09/17 5\' 6"  (1.676 m)    Weight:   Wt Readings from Last 1 Encounters:  01/15/17 240 lb 11.9 oz (109.2 kg)    Ideal Body Weight:  59 kg  BMI:  Body mass index is 38.86 kg/m.  Estimated Nutritional Needs:   Kcal:   2100-2300  Protein:  100-115 gm  Fluid:  2.1-2.3 L    Shep Porter A. Jimmye Norman, RD, LDN, CDE Pager: [phone] After hours Pager: [phone]

## 2017-01-15 NOTE — Progress Notes (Addendum)
      [address]       [place] [address]             [phone]      5 Days Post-Op Procedure(s) (LRB): PERICARDIAL WINDOW- SUB XYPHOID, RIGHT CHEST TUBE (N/A)   Subjective:  No specific complaints.  She apologizes for being a little groggy, which she states is caused by her chemotherapy tablet.  Objective: Vital signs in last 24 hours: Temp:  [97.1 F (36.2 C)-98.1 F (36.7 C)] 97.7 F (36.5 C) (12/20 0758) Pulse Rate:  [92-98] 98 (12/20 0323) Cardiac Rhythm: Normal sinus rhythm (12/20 0700) Resp:  [11-24] 11 (12/20 0323) BP: (89-109)/(65-82) 100/74 (12/20 0758) SpO2:  [96 %-99 %] 96 % (12/20 0323) Weight:  [240 lb 11.9 oz (109.2 kg)] 240 lb 11.9 oz (109.2 kg) (12/20 0530)  Hemodynamic parameters for last 24 hours:    Intake/Output from previous day: 12/19 0701 - 12/20 0700 In: 1760 [P.O.:360; I.V.:1000; IV Piggyback:400] Out: 650 [Urine:350; Chest Tube:300]  General appearance: alert, cooperative and no distress Heart: regular rate and rhythm Lungs: diminished breath sounds bilaterally Abdomen: soft, non-tender; bowel sounds normal; no masses,  no organomegaly Extremities: edema trace Wound: clean and dry  Lab Results: Recent Labs    01/14/17 0402 01/15/17 0615  WBC 12.9* 13.4*  HGB 10.6* 10.8*  HCT 33.2* 34.1*  PLT 282 302   BMET:  Recent Labs    01/14/17 0402 01/15/17 0615  NA 135 133*  K 3.2* 3.2*  CL 101 102  CO2 26 27  GLUCOSE 91 102*  BUN 11 6  CREATININE 0.89 0.79  CALCIUM 8.2* 8.0*    PT/INR: No results for input(s): LABPROT, INR in the last 72 hours. ABG    Component Value Date/Time   PHART 7.399 01/11/2017 0422   HCO3 27.3 01/11/2017 0422   TCO2 24 01/10/2017 0234   O2SAT 97.0 01/11/2017 0422   CBG (last 3)  No results for input(s): GLUCAP in the last 72 hours.  Assessment/Plan: S/P Procedure(s) (LRB): PERICARDIAL WINDOW- SUB XYPHOID, RIGHT CHEST TUBE (N/A)  1. Pericardial Drain- only 20 cc output  yesterday- will discuss possible removal with Dr. Prescott Gum 2. Pleural chest tube- 280 cc output- leave in place today, will plan to perform talc pleurodesis once output is less than 150 3. Dispo- patient stable, continue current care, will discuss possible removal of pericardial drain with Dr. Prescott Gum   LOS: 6 days    Ellwood Handler 01/15/2017   Remove pericardial drain today Talc R pleural drain when output < 150 cc patient examined and medical record reviewed,agree with above note. Tharon Aquas Trigt III 01/15/2017

## 2017-01-15 NOTE — Progress Notes (Signed)
PROGRESS NOTE [facility]   Nancy Moore   JJO:[id number] DOB: 07-Mar-1965  DOA: 01/09/2017 PCP: Darreld Mclean, MD   Brief Narrative:  Nancy Moore is a 51 year old female with medical history significant for stage IV lung cancer, hypertension who presented to the emergency department complaining of severe shortness of breath. She recently had a re-staging CT scan of her chest which showed a large, complex pericardial effusion as well as a large right pleural effusion. An urgent referral to cardiology was done and Dr. Debara Pickett  performed a 2D Echo, which showed massive pericardial effusion with signs of early tamponade. She was sent to ED further evaluation and treatment.  She is admitted pericardial effusion with early tamponade underwent pericardial window with R chest tube placement.   Subjective: Now new complains today, started Gilotrif yesterday tolerating well. No acute events overnight. Pain well controlled with PO medications   Assessment & Plan: Pericardial effusion and pleural effusion Likely secondary to malignancy,  S/p pericardial window and R chest tube 01/10/17 Pathology, pericardial biopsy negative for malignancy, Fluid cytology positive for malignant cells  Continue management per TCTS, planning for talc pleurodesis after tube removal  Pain well controlled with Oxy, will d/c PCA pump   Adenocarcinoma of left lung, stage IV Patient followed by Dr. Earlie Server as an outpatient Continue Afatinib Oncology recommending to continue GILOTRIF as well   Hypertension Continue to be stable  Continue current meds   Hypokalemia/HypoMag+  Replete  Check BMP and Mag in Am   Acute renal failure - Resolved and stable  Likely due to Lasix  Lasix was d/ced   Continue oral hydration  Monitor Cr    UTI - Klebsiella - Leukocytosis improving  Patient report increase in frequency  Initially treated with Augmentin switched to [place] - for total of 7 days  Remains afebrile     General deconditioning  PMR evaluation, did not recommend CIR - HH with PT    DVT prophylaxis: TED Hose, SCD's  Code Status: Full code Family Communication: Family at bedside Disposition Plan: When clear by TCTS  Consultants:   TCTS - Dr Prescott Gum   Procedures:   None  Antimicrobials:  None   Objective: Vitals:   01/15/17 0041 01/15/17 0323 01/15/17 0530 01/15/17 0758  BP: 101/65 105/82  100/74  Pulse: 92 98    Resp: 16 11    Temp: (!) 97.1 F (36.2 C) 98.1 F (36.7 C)  97.7 F (36.5 C)  TempSrc: Axillary Oral  Oral  SpO2: 96% 96%    Weight:   109.2 kg (240 lb 11.9 oz)   Height:        Intake/Output Summary (Last 24 hours) at 01/15/2017 0844 Last data filed at 01/15/2017 0600 Gross per 24 hour  Intake 1460 ml  Output 650 ml  Net 810 ml   Filed Weights   01/09/17 1508 01/14/17 0303 01/15/17 0530  Weight: 102.5 kg (226 lb) 101.9 kg (224 lb 10.4 oz) 109.2 kg (240 lb 11.9 oz)    Examination:  General: NAD  Cardiovascular: RRR, S1/S2 +, no rubs, no gallops Respiratory: Aeration improved no crackles appreciated  Abdominal: Soft, NT, ND, bowel sounds + Extremities: trace LE edemas  Data Reviewed: I have personally reviewed following labs and imaging studies  CBC: Recent Labs  Lab 01/08/17 1055  01/11/17 0425 01/12/17 0529 01/13/17 0847 01/14/17 0402 01/15/17 0615  WBC 10.7*   < > 10.9* 18.8* 16.4* 12.9* 13.4*  NEUTROABS 7.6*  --   --  14.7* 11.1* 7.9* 8.7*  HGB 10.2*   < > 9.2* 9.3* 10.3* 10.6* 10.8*  HCT 31.8*   < > 29.6* 29.8* 32.2* 33.2* 34.1*  MCV 83.8   < > 83.4 83.0 82.4 81.8 82.8  PLT 268   < > 252 248 266 282 302   < > = values in this interval not displayed.   Basic Metabolic Panel: Recent Labs  Lab 01/09/17 2221  01/11/17 0425 01/11/17 0930 01/12/17 0529 01/13/17 0847 01/14/17 0402 01/15/17 0615  NA  --    < > 136  --  133* 134* 135 133*  K  --    < > 3.7  --  3.7 3.7 3.2* 3.2*  CL  --    < > 101  --  99* 102 101 102  CO2   --    < > 29  --  28 26 26 27   GLUCOSE  --    < > 123*  --  128* 85 91 102*  BUN  --    < > 8  --  13 13 11 6   CREATININE  --    < > 0.98  --  1.13* 0.93 0.89 0.79  CALCIUM  --    < > 8.5*  --  8.6* 8.5* 8.2* 8.0*  MG 2.1  --   --  1.6*  --  1.7 1.5* 1.5*   < > = values in this interval not displayed.   GFR: Estimated Creatinine Clearance: 105.3 mL/min (by C-G formula based on SCr of 0.79 mg/dL). Liver Function Tests: Recent Labs  Lab 01/08/17 1055 01/10/17 0333 01/11/17 0930 01/12/17 0529  AST 31 33  --  23  ALT 34 34  --  22  ALKPHOS 82 81  --  57  BILITOT 1.74* 1.6*  --  1.3*  PROT 7.1 6.8  --  5.2*  ALBUMIN 3.6 3.5 3.0* 2.6*   No results for input(s): LIPASE, AMYLASE in the last 168 hours. No results for input(s): AMMONIA in the last 168 hours. Coagulation Profile: Recent Labs  Lab 01/09/17 2221 01/10/17 0333  INR 1.49 1.42   Cardiac Enzymes: No results for input(s): CKTOTAL, CKMB, CKMBINDEX, TROPONINI in the last 168 hours. BNP (last 3 results) No results for input(s): PROBNP in the last 8760 hours. HbA1C: No results for input(s): HGBA1C in the last 72 hours. CBG: Recent Labs  Lab 01/11/17 0755  GLUCAP 125*   Lipid Profile: No results for input(s): CHOL, HDL, LDLCALC, TRIG, CHOLHDL, LDLDIRECT in the last 72 hours. Thyroid Function Tests: No results for input(s): TSH, T4TOTAL, FREET4, T3FREE, THYROIDAB in the last 72 hours. Anemia Panel: No results for input(s): VITAMINB12, FOLATE, FERRITIN, TIBC, IRON, RETICCTPCT in the last 72 hours. Sepsis Labs: Recent Labs  Lab 01/09/17 2221 01/10/17 0333  PROCALCITON <0.10  --   LATICACIDVEN 1.5 1.7    Recent Results (from the past 240 hour(s))  Culture, blood (Routine X 2) w Reflex to ID Panel     Status: None (Preliminary result)   Collection Time: 01/09/17 10:35 PM  Result Value Ref Range Status   Specimen Description BLOOD RIGHT HAND  Final   Special Requests IN PEDIATRIC BOTTLE Blood Culture adequate  volume  Final   Culture NO GROWTH 4 DAYS  Final   Report Status PENDING  Incomplete  Culture, blood (Routine X 2) w Reflex to ID Panel     Status: None (Preliminary result)   Collection Time: 01/09/17 10:50 PM  Result  Value Ref Range Status   Specimen Description BLOOD LEFT HAND  Final   Special Requests IN PEDIATRIC BOTTLE Blood Culture adequate volume  Final   Culture NO GROWTH 4 DAYS  Final   Report Status PENDING  Incomplete  Surgical pcr screen     Status: Abnormal   Collection Time: 01/09/17 11:10 PM  Result Value Ref Range Status   MRSA, PCR NEGATIVE NEGATIVE Final   Staphylococcus aureus POSITIVE (A) NEGATIVE Final    Comment: (NOTE) The Xpert SA Assay (FDA approved for NASAL specimens in patients 71 years of age and older), is one component of a comprehensive surveillance program. It is not intended to diagnose infection nor to guide or monitor treatment.   Urine Culture     Status: Abnormal   Collection Time: 01/10/17  6:25 AM  Result Value Ref Range Status   Specimen Description URINE, RANDOM  Final   Special Requests NONE  Final   Culture >=100,000 COLONIES/mL KLEBSIELLA OXYTOCA (A)  Final   Report Status 01/13/2017 FINAL  Final   Organism ID, Bacteria KLEBSIELLA OXYTOCA (A)  Final      Susceptibility   Klebsiella oxytoca - MIC*    AMPICILLIN >=32 RESISTANT Resistant     CEFAZOLIN <=4 SENSITIVE Sensitive     CEFTRIAXONE <=1 SENSITIVE Sensitive     CIPROFLOXACIN <=0.25 SENSITIVE Sensitive     GENTAMICIN <=1 SENSITIVE Sensitive     IMIPENEM <=0.25 SENSITIVE Sensitive     NITROFURANTOIN <=16 SENSITIVE Sensitive     TRIMETH/SULFA <=20 SENSITIVE Sensitive     AMPICILLIN/SULBACTAM 8 SENSITIVE Sensitive     PIP/TAZO <=4 SENSITIVE Sensitive     Extended ESBL NEGATIVE Sensitive     * >=100,000 COLONIES/mL KLEBSIELLA OXYTOCA      Radiology Studies: Dg Chest Port 1 View  Result Date: 01/15/2017 CLINICAL DATA:  Followup pleural effusion and chest tubes. EXAM:  PORTABLE CHEST 1 VIEW COMPARISON:  01/14/2017 FINDINGS: Bilateral chest tubes remain in place. Right internal jugular central line tip at the SVC RA junction or proximal right atrium. Right pneumothorax is larger, 5-10% presently. No pneumothorax on the left. Slight worsening of infiltrate/volume loss at the right lung base. Persistent left pleural density with volume loss/ infiltrate in the left lower lung. IMPRESSION: Increase in right pneumothorax, now 5-10%. Slight worsening of infiltrate/volume loss at the right lung base. Persistent left pleural density with left lower lung infiltrate/collapse. Electronically Signed   By: Nelson Chimes M.D.   On: 01/15/2017 07:18   Dg Chest Port 1 View  Result Date: 01/14/2017 CLINICAL DATA:  Pleural effusion, short of breath EXAM: PORTABLE CHEST 1 VIEW COMPARISON:  01/13/2017 FINDINGS: Right chest tube remains in place. Tiny right apical pneumothorax unchanged. Pericardial drain unchanged position. Central venous catheter tip in the upper right atrium Dense consolidation of the left lung base with associated left effusion unchanged. Improved aeration right lung base which is now nearly clear. IMPRESSION: Tiny right apical pneumothorax unchanged. Dense consolidation left lower lobe with left effusion unchanged. Improved aeration right lung base. Overall improved lung volume since yesterday. Pericardial drain remains in place. Electronically Signed   By: Franchot Gallo M.D.   On: 01/14/2017 06:56    Scheduled Meds: . acetaminophen  1,000 mg Oral Q6H   Or  . acetaminophen (TYLENOL) oral liquid 160 mg/5 mL  1,000 mg Oral Q6H  . afatinib dimaleate  40 mg Oral Daily  . bisacodyl  10 mg Oral Daily  . cefdinir  250 mg Oral  BID  . clindamycin   Topical BID  . feeding supplement  1 Container Oral TID BM  . fentaNYL   Intravenous Q4H  . furosemide  40 mg Oral Daily  . mupirocin ointment   Nasal BID  . pantoprazole  40 mg Oral Q1200  . senna-docusate  1 tablet Oral  QHS   Continuous Infusions: . sodium chloride 50 mL/hr at 01/14/17 1900  . magnesium sulfate 1 - 4 g bolus IVPB    . potassium chloride    . sodium chloride       LOS: 6 days    Time spent: Total of 15 minutes spent with pt, greater than 50% of which was spent in discussion of  treatment, counseling and coordination of care   Chipper Oman, MD Pager: Text Page via [email or web address]   If 7PM-7AM, please contact night-coverage [email or web address] 01/15/2017, 8:44 AM

## 2017-01-16 ENCOUNTER — Inpatient Hospital Stay (HOSPITAL_COMMUNITY): Payer: 59

## 2017-01-16 DIAGNOSIS — I314 Cardiac tamponade: Secondary | ICD-10-CM

## 2017-01-16 DIAGNOSIS — N179 Acute kidney failure, unspecified: Secondary | ICD-10-CM

## 2017-01-16 LAB — CBC WITH DIFFERENTIAL/PLATELET
Basophils Absolute: 0 10*3/uL (ref 0.0–0.1)
Basophils Relative: 0 %
Eosinophils Absolute: 0.8 10*3/uL — ABNORMAL HIGH (ref 0.0–0.7)
Eosinophils Relative: 7 %
HCT: 32.5 % — ABNORMAL LOW (ref 36.0–46.0)
Hemoglobin: 10.4 g/dL — ABNORMAL LOW (ref 12.0–15.0)
Lymphocytes Relative: 18 %
Lymphs Abs: 2 10*3/uL (ref 0.7–4.0)
MCH: 26.5 pg (ref 26.0–34.0)
MCHC: 32 g/dL (ref 30.0–36.0)
MCV: 82.9 fL (ref 78.0–100.0)
Monocytes Absolute: 1.3 10*3/uL — ABNORMAL HIGH (ref 0.1–1.0)
Monocytes Relative: 11 %
Neutro Abs: 7.3 10*3/uL (ref 1.7–7.7)
Neutrophils Relative %: 64 %
Platelets: 283 10*3/uL (ref 150–400)
RBC: 3.92 MIL/uL (ref 3.87–5.11)
RDW: 16.8 % — ABNORMAL HIGH (ref 11.5–15.5)
WBC: 11.3 10*3/uL — ABNORMAL HIGH (ref 4.0–10.5)

## 2017-01-16 LAB — BASIC METABOLIC PANEL
Anion gap: 4 — ABNORMAL LOW (ref 5–15)
BUN: 7 mg/dL (ref 6–20)
CO2: 28 mmol/L (ref 22–32)
Calcium: 8.3 mg/dL — ABNORMAL LOW (ref 8.9–10.3)
Chloride: 102 mmol/L (ref 101–111)
Creatinine, Ser: 0.81 mg/dL (ref 0.44–1.00)
GFR calc Af Amer: >60 mL/min (ref >60)
GFR calc non Af Amer: >60 mL/min (ref >60)
Glucose, Bld: 108 mg/dL — ABNORMAL HIGH (ref 65–99)
Potassium: 3.4 mmol/L — ABNORMAL LOW (ref 3.5–5.1)
Sodium: 134 mmol/L — ABNORMAL LOW (ref 135–145)

## 2017-01-16 LAB — MAGNESIUM: Magnesium: 1.8 mg/dL (ref 1.7–2.4)

## 2017-01-16 MED ORDER — POTASSIUM CHLORIDE 10 MEQ/100ML IV SOLN: 10.0000 meq | INTRAVENOUS | Status: DC

## 2017-01-16 MED ORDER — POTASSIUM CHLORIDE 10 MEQ/100ML IV SOLN
10.0000 meq | INTRAVENOUS | Status: AC
  Administered 2017-01-16 (×2): 10 meq via INTRAVENOUS
  Filled 2017-01-16 (×2): qty 100

## 2017-01-16 MED ORDER — MAGNESIUM SULFATE 2 GM/50ML IV SOLN
2.0000 g | Freq: Once | INTRAVENOUS | Status: AC
  Administered 2017-01-16: 2 g via INTRAVENOUS
  Filled 2017-01-16: qty 50

## 2017-01-16 NOTE — Progress Notes (Signed)
PROGRESS NOTE [facility]   Nancy Moore   OEH:[id number] DOB: 08-27-1965  DOA: 01/09/2017 PCP: Darreld Mclean, MD   Brief Narrative:  Nancy Moore is a 50 year old female with medical history significant for stage IV lung cancer, hypertension who presented to the emergency department complaining of severe shortness of breath. She recently had a re-staging CT scan of her chest which showed a large, complex pericardial effusion as well as a large right pleural effusion. An urgent referral to cardiology was done and Dr. Debara Pickett  performed a 2D Echo, which showed massive pericardial effusion with signs of early tamponade. She was sent to ED further evaluation and treatment.  She is admitted pericardial effusion with early tamponade underwent pericardial window with R chest tube placement.   Subjective: No new complaints, breathing is ok.    Assessment & Plan: Pericardial effusion and pleural effusion - continues to improve  Likely secondary to malignancy.  S/p pericardial window and R chest tube 01/10/17 Pathology, pericardial biopsy negative for malignancy, Fluid cytology positive for malignant cells  Continue management per TCTS, planning for talc pleurodesis after tube removal   Adenocarcinoma of left lung, stage IV Patient followed by Dr. Earlie Server as an outpatient Continue Afatinib Oncology recommending to continue GILOTRIF as well   Hypertension Continue to be stable  Continue current meds   Hypokalemia/HypoMag+  Replete  Check BMP and Mag in Am   Acute renal failure - Resolved and stable  Likely due to Lasix  Lasix was d/ced   Continue oral hydration  Monitor Cr    UTI - Klebsiella - Leukocytosis improving  Patient report increase in frequency  Initially treated with Augmentin switched to [place] - for total of 7 days  Remains afebrile   General deconditioning  PMR evaluation, did not recommend CIR - HH with PT    DVT prophylaxis: TED Hose, SCD's  Code  Status: Full code Family Communication: Family at bedside Disposition Plan: When clear by TCTS  Consultants:   TCTS - Dr Prescott Gum   Procedures:   None  Antimicrobials:  None   Objective: Vitals:   01/16/17 0430 01/16/17 0808 01/16/17 1143 01/16/17 1600  BP: 127/72 107/62 101/70   Pulse: 93 (!) 102 (!) 101 (!) 111  Resp: (!) 23 16 14 16   Temp: 98.2 F (36.8 C) 98.4 F (36.9 C) 98.2 F (36.8 C) 99.2 F (37.3 C)  TempSrc: Oral Oral Oral Axillary  SpO2: 93% 97% 95% 99%  Weight: 104.1 kg (229 lb 8 oz)     Height:        Intake/Output Summary (Last 24 hours) at 01/16/2017 1605 Last data filed at 01/16/2017 0820 Gross per 24 hour  Intake 222 ml  Output 560 ml  Net -338 ml   Filed Weights   01/14/17 0303 01/15/17 0530 01/16/17 0430  Weight: 101.9 kg (224 lb 10.4 oz) 109.2 kg (240 lb 11.9 oz) 104.1 kg (229 lb 8 oz)    Examination: No changes on exam from 01/15/17  General: NAD  Cardiovascular: RRR, S1/S2 +, no rubs, no gallops Respiratory: Aeration improved no crackles appreciated  Abdominal: Soft, NT, ND, bowel sounds + Extremities: trace LE edemas  Data Reviewed: I have personally reviewed following labs and imaging studies  CBC: Recent Labs  Lab 01/12/17 0529 01/13/17 0847 01/14/17 0402 01/15/17 0615 01/16/17 0426  WBC 18.8* 16.4* 12.9* 13.4* 11.3*  NEUTROABS 14.7* 11.1* 7.9* 8.7* 7.3  HGB 9.3* 10.3* 10.6* 10.8* 10.4*  HCT 29.8* 32.2*  33.2* 34.1* 32.5*  MCV 83.0 82.4 81.8 82.8 82.9  PLT 248 266 282 302 035   Basic Metabolic Panel: Recent Labs  Lab 01/11/17 0930 01/12/17 0529 01/13/17 0847 01/14/17 0402 01/15/17 0615 01/16/17 0426  NA  --  133* 134* 135 133* 134*  K  --  3.7 3.7 3.2* 3.2* 3.4*  CL  --  99* 102 101 102 102  CO2  --  28 26 26 27 28   GLUCOSE  --  128* 85 91 102* 108*  BUN  --  13 13 11 6 7   CREATININE  --  1.13* 0.93 0.89 0.79 0.81  CALCIUM  --  8.6* 8.5* 8.2* 8.0* 8.3*  MG 1.6*  --  1.7 1.5* 1.5* 1.8   GFR: Estimated  Creatinine Clearance: 101.3 mL/min (by C-G formula based on SCr of 0.81 mg/dL). Liver Function Tests: Recent Labs  Lab 01/10/17 0333 01/11/17 0930 01/12/17 0529  AST 33  --  23  ALT 34  --  22  ALKPHOS 81  --  57  BILITOT 1.6*  --  1.3*  PROT 6.8  --  5.2*  ALBUMIN 3.5 3.0* 2.6*   No results for input(s): LIPASE, AMYLASE in the last 168 hours. No results for input(s): AMMONIA in the last 168 hours. Coagulation Profile: Recent Labs  Lab 01/09/17 2221 01/10/17 0333  INR 1.49 1.42   Cardiac Enzymes: No results for input(s): CKTOTAL, CKMB, CKMBINDEX, TROPONINI in the last 168 hours. BNP (last 3 results) No results for input(s): PROBNP in the last 8760 hours. HbA1C: No results for input(s): HGBA1C in the last 72 hours. CBG: Recent Labs  Lab 01/11/17 0755  GLUCAP 125*   Lipid Profile: No results for input(s): CHOL, HDL, LDLCALC, TRIG, CHOLHDL, LDLDIRECT in the last 72 hours. Thyroid Function Tests: No results for input(s): TSH, T4TOTAL, FREET4, T3FREE, THYROIDAB in the last 72 hours. Anemia Panel: No results for input(s): VITAMINB12, FOLATE, FERRITIN, TIBC, IRON, RETICCTPCT in the last 72 hours. Sepsis Labs: Recent Labs  Lab 01/09/17 2221 01/10/17 0333  PROCALCITON <0.10  --   LATICACIDVEN 1.5 1.7    Recent Results (from the past 240 hour(s))  Culture, blood (Routine X 2) w Reflex to ID Panel     Status: None   Collection Time: 01/09/17 10:35 PM  Result Value Ref Range Status   Specimen Description BLOOD RIGHT HAND  Final   Special Requests IN PEDIATRIC BOTTLE Blood Culture adequate volume  Final   Culture NO GROWTH 5 DAYS  Final   Report Status 01/15/2017 FINAL  Final  Culture, blood (Routine X 2) w Reflex to ID Panel     Status: None   Collection Time: 01/09/17 10:50 PM  Result Value Ref Range Status   Specimen Description BLOOD LEFT HAND  Final   Special Requests IN PEDIATRIC BOTTLE Blood Culture adequate volume  Final   Culture NO GROWTH 5 DAYS  Final    Report Status 01/15/2017 FINAL  Final  Surgical pcr screen     Status: Abnormal   Collection Time: 01/09/17 11:10 PM  Result Value Ref Range Status   MRSA, PCR NEGATIVE NEGATIVE Final   Staphylococcus aureus POSITIVE (A) NEGATIVE Final    Comment: (NOTE) The Xpert SA Assay (FDA approved for NASAL specimens in patients 45 years of age and older), is one component of a comprehensive surveillance program. It is not intended to diagnose infection nor to guide or monitor treatment.   Urine Culture     Status:  Abnormal   Collection Time: 01/10/17  6:25 AM  Result Value Ref Range Status   Specimen Description URINE, RANDOM  Final   Special Requests NONE  Final   Culture >=100,000 COLONIES/mL KLEBSIELLA OXYTOCA (A)  Final   Report Status 01/13/2017 FINAL  Final   Organism ID, Bacteria KLEBSIELLA OXYTOCA (A)  Final      Susceptibility   Klebsiella oxytoca - MIC*    AMPICILLIN >=32 RESISTANT Resistant     CEFAZOLIN <=4 SENSITIVE Sensitive     CEFTRIAXONE <=1 SENSITIVE Sensitive     CIPROFLOXACIN <=0.25 SENSITIVE Sensitive     GENTAMICIN <=1 SENSITIVE Sensitive     IMIPENEM <=0.25 SENSITIVE Sensitive     NITROFURANTOIN <=16 SENSITIVE Sensitive     TRIMETH/SULFA <=20 SENSITIVE Sensitive     AMPICILLIN/SULBACTAM 8 SENSITIVE Sensitive     PIP/TAZO <=4 SENSITIVE Sensitive     Extended ESBL NEGATIVE Sensitive     * >=100,000 COLONIES/mL KLEBSIELLA OXYTOCA      Radiology Studies: Dg Chest Port 1 View  Result Date: 01/16/2017 CLINICAL DATA:  Chest tube EXAM: PORTABLE CHEST 1 VIEW COMPARISON:  Yesterday FINDINGS: Unchanged small right apical pneumothorax. Right IJ line with tip at the upper right atrium. Unchanged left pleural effusion and left lung opacity. Pericardial drain has been removed. Stable cardiopericardial enlargement. IMPRESSION: 1. Unchanged small right apical pneumothorax. 2. Stable cardiopericardial size after pericardial drain removal. 3. Left pleural effusion and lung opacity.  Electronically Signed   By: Monte Fantasia M.D.   On: 01/16/2017 09:05   Dg Chest Port 1 View  Result Date: 01/15/2017 CLINICAL DATA:  Followup pleural effusion and chest tubes. EXAM: PORTABLE CHEST 1 VIEW COMPARISON:  01/14/2017 FINDINGS: Bilateral chest tubes remain in place. Right internal jugular central line tip at the SVC RA junction or proximal right atrium. Right pneumothorax is larger, 5-10% presently. No pneumothorax on the left. Slight worsening of infiltrate/volume loss at the right lung base. Persistent left pleural density with volume loss/ infiltrate in the left lower lung. IMPRESSION: Increase in right pneumothorax, now 5-10%. Slight worsening of infiltrate/volume loss at the right lung base. Persistent left pleural density with left lower lung infiltrate/collapse. Electronically Signed   By: Nelson Chimes M.D.   On: 01/15/2017 07:18    Scheduled Meds: . afatinib dimaleate  40 mg Oral Daily  . bisacodyl  10 mg Oral Daily  . cefdinir  250 mg Oral BID  . clindamycin   Topical BID  . feeding supplement  1 Container Oral TID BM  . furosemide  40 mg Oral Daily  . mupirocin ointment   Nasal BID  . pantoprazole  40 mg Oral Q1200  . senna-docusate  1 tablet Oral QHS   Continuous Infusions: . sodium chloride 50 mL/hr at 01/15/17 2115  . sodium chloride       LOS: 7 days    Time spent: Total of 15 minutes spent with pt, greater than 50% of which was spent in discussion of  treatment, counseling and coordination of care   Chipper Oman, MD Pager: Text Page via [email or web address]   If 7PM-7AM, please contact night-coverage [email or web address] 01/16/2017, 4:05 PM

## 2017-01-16 NOTE — Progress Notes (Signed)
Inpatient Rehabilitation  Met with patient at bedside to discuss team's recommendation for IP Rehab.  Shared booklets, insurance verification letter, and answered questions.  Plan to initiate insurance authorization in hopes of medical readiness next week.  Patient to discuss with her family over the weekend and work toward solidifying a discharge destination. Plan to follow up with patient Monday; call if questions.  Carmelia Roller., CCC/SLP Admission Coordinator  [facility]  Cell [phone]

## 2017-01-16 NOTE — Progress Notes (Addendum)
Physical Therapy Treatment Patient Details Name: Nancy Moore MRN: [id number] DOB: 11/01/1965 Today's Date: 01/16/2017    History of Present Illness Pt is a 51 y.o. female with PMH significant for stage IVlung adenocarcinoma and HTN, who presented to ED on 01/09/17 with worsening SOB. Pt recently had a re-staging chest CT which showed a large, complex pericardial effusion and large right pleural effusion. Now s/p subxyphoid pericardial window and R chest tube placement on 12/15.     PT Comments    Treatment focused on LE strengthening to improve function.  RN in room on arrival and reports patient is unable to transfer from bed to chair once chemo med is administered and in her system. Pt is requiring min guard with transfers for safety due to weakness during today's treatment.  Pt performed 2 exercises in standing before exhibiting elevated HR and increased RR/WOB.  Pt required mulitple standing rest breaks and x2 seated rest breaks during session.  Pt is motivated to perform tasks asked of her during session.  Pt continues to be an excellent candidate for CIR therapies to return to baseline and return home.  Pt is slow to ascend from chair and at an increased risk for falls until her balance improves.  Pt required min assist in standing to complete dynamic tasks during standing therapeutic exercises with UE support.  Plan next session to see patient after chemo med is administered to assess deficits the nurse described today.   Follow Up Recommendations  CIR     Equipment Recommendations  Other (comment)(TBD at next venue)    Recommendations for Other Services OT consult     Precautions / Restrictions Precautions Precautions: Fall Precaution Comments: chest tube  Restrictions Weight Bearing Restrictions: No    Mobility  Bed Mobility               General bed mobility comments: Pt sitting in recliner on arrival.   Transfers Overall transfer level: Needs  assistance Equipment used: Rolling walker (2 wheeled) Transfers: Sit to/from Stand Sit to Stand: Min guard Stand pivot transfers: Min assist       General transfer comment: Cues for hand placement to and from seated surface.  pt attempting to reach to pull on the RW.  Pt with poor eccentric loading as she begins to fatigue.  Pt performed x3 reps of sit to stand transfers to RW inbetween exercises.    Ambulation/Gait Ambulation/Gait assistance: (deferred gait as focus of treatment on LE strengthening to improve function.  )               Stairs            Wheelchair Mobility    Modified Rankin (Stroke Patients Only)       Balance Overall balance assessment: Needs assistance Sitting-balance support: No upper extremity supported;Feet supported Sitting balance-Leahy Scale: Fair     Standing balance support: No upper extremity supported;Single extremity supported;During functional activity Standing balance-Leahy Scale: Fair Standing balance comment: statically stands with min guard assist                             Cognition Arousal/Alertness: Awake/alert Behavior During Therapy: WFL for tasks assessed/performed Overall Cognitive Status: Within Functional Limits for tasks assessed  Exercises General Exercises - Lower Extremity Long Arc Quad: AROM;Both;10 reps;Seated Hip ABduction/ADduction: AROM;Both;Standing;Seated;20 reps(1x10 seated and 1x10 in standing. ) Hip Flexion/Marching: AROM;Both;20 reps;Seated;Standing(1x10 seated and 1x10 in standing.  ) Heel Raises: AROM;Both;10 reps;Standing Mini-Sqauts: AROM;Both;Standing;10 reps    General Comments General comments (skin integrity, edema, etc.): HR to 126.  Discussed pacing and taking rest breaks with pt       Pertinent Vitals/Pain Pain Assessment: 0-10 Pain Score: 6  Faces Pain Scale: Hurts little more Pain Location: chest tube site Pain  Descriptors / Indicators: Aching;Grimacing Pain Intervention(s): Monitored during session;Repositioned;Patient requesting pain meds-RN notified    Home Living                      Prior Function            PT Goals (current goals can now be found in the care plan section) Acute Rehab PT Goals Patient Stated Goal: Get stronger at rehab before return home Potential to Achieve Goals: Good Progress towards PT goals: Progressing toward goals    Frequency    Min 3X/week      PT Plan Current plan remains appropriate    Co-evaluation              AM-PAC PT "6 Clicks" Daily Activity  Outcome Measure  Difficulty turning over in bed (including adjusting bedclothes, sheets and blankets)?: Unable Difficulty moving from lying on back to sitting on the side of the bed? : Unable Difficulty sitting down on and standing up from a chair with arms (e.g., wheelchair, bedside commode, etc,.)?: Unable Help needed moving to and from a bed to chair (including a wheelchair)?: A Little Help needed walking in hospital room?: A Little Help needed climbing 3-5 steps with a railing? : A Lot 6 Click Score: 11    End of Session Equipment Utilized During Treatment: Gait belt Activity Tolerance: Patient tolerated treatment well;Patient limited by fatigue Patient left: with call bell/phone within reach;with family/visitor present;in chair Nurse Communication: Mobility status PT Visit Diagnosis: Other abnormalities of gait and mobility (R26.89);Muscle weakness (generalized) (M62.81)     Time: 2458-0998 PT Time Calculation (min) (ACUTE ONLY): 25 min  Charges:  $Therapeutic Exercise: 8-22 mins $Therapeutic Activity: 8-22 mins                    G Codes:       Governor Rooks, PTA pager [phone]    Cristela Blue 01/16/2017, 5:26 PM

## 2017-01-16 NOTE — Progress Notes (Addendum)
      [address]       [place] [address]             [phone]      6 Days Post-Op Procedure(s) (LRB): PERICARDIAL WINDOW- SUB XYPHOID, RIGHT CHEST TUBE (N/A)   Subjective:  No new complaints.  States she feels pretty good.  Remains anxious about talc pleurodesis.  Objective: Vital signs in last 24 hours: Temp:  [97.7 F (36.5 C)-98.3 F (36.8 C)] 98.2 F (36.8 C) (12/21 0430) Pulse Rate:  [93-94] 93 (12/21 0430) Cardiac Rhythm: Normal sinus rhythm (12/21 0430) Resp:  [15-23] 23 (12/21 0430) BP: (100-127)/(66-74) 127/72 (12/21 0430) SpO2:  [93 %-94 %] 93 % (12/21 0430) Weight:  [229 lb 8 oz (104.1 kg)] 229 lb 8 oz (104.1 kg) (12/21 0430)  Intake/Output from previous day: 12/20 0701 - 12/21 0700 In: 222 [P.O.:222] Out: 560 [Urine:350; Chest Tube:210]  General appearance: alert, cooperative and no distress Heart: regular rate and rhythm Lungs: diminished breath sounds bibasilar Abdomen: soft, non-tender; bowel sounds normal; no masses,  no organomegaly Extremities: edema improved Wound: clean and dry  Lab Results: Recent Labs    01/15/17 0615 01/16/17 0426  WBC 13.4* 11.3*  HGB 10.8* 10.4*  HCT 34.1* 32.5*  PLT 302 283   BMET:  Recent Labs    01/15/17 0615 01/16/17 0426  NA 133* 134*  K 3.2* 3.4*  CL 102 102  CO2 27 28  GLUCOSE 102* 108*  BUN 6 7  CREATININE 0.79 0.81  CALCIUM 8.0* 8.3*    PT/INR: No results for input(s): LABPROT, INR in the last 72 hours. ABG    Component Value Date/Time   PHART 7.399 01/11/2017 0422   HCO3 27.3 01/11/2017 0422   TCO2 24 01/10/2017 0234   O2SAT 97.0 01/11/2017 0422   CBG (last 3)  No results for input(s): GLUCAP in the last 72 hours.  Assessment/Plan: S/P Procedure(s) (LRB): PERICARDIAL WINDOW- SUB XYPHOID, RIGHT CHEST TUBE (N/A)  1. Pleural Chest tube- 250 cc output yesterday- leave in place today, output too high to perform bedside talc procedure ( level at 1370) 2. Pulm- no on  oxygen, CXR is stable 3. Dispo- care per medicine, leave chest tube in place... Will talc once output is <150  LOS: 7 days    Erin Barrett 01/16/2017  CXR stable She should remain in hospital until R talc pleurodesis performed and chest tube removed patient examined and medical record reviewed,agree with above note. Tharon Aquas Trigt III 01/16/2017

## 2017-01-16 NOTE — NC FL2 (Signed)
[place] LEVEL OF CARE SCREENING TOOL     IDENTIFICATION  Patient Name: Nancy Moore Birthdate: 1965-12-29 Sex: female Admission Date (Current Location): 01/09/2017  [facility] and [place] Number:  Herbalist and Address:  [facility], [place] [address], [place], [place] [address]      Provider Number: [id number]  Attending Physician Name and Address:  Patrecia Pour, Christean Grief, MD  Relative Name and Phone Number:       Current Level of Care: Hospital Recommended Level of Care: [place] Prior Approval Number:    Date Approved/Denied:   PASRR Number: [id number]  Discharge Plan: SNF    Current Diagnoses: Patient Active Problem List   Diagnosis Date Noted  . Cardiac/pericardial tamponade   . Chest tube in place   . Acute lower UTI   . Pain   . Benign essential HTN   . Acute blood loss anemia   . Palliative care encounter   . AKI (acute kidney injury) ([place]) 01/10/2017  . Hypotension 01/09/2017  . Essential hypertension 01/09/2017  . Pleural effusion 01/09/2017  . Hypokalemia 01/09/2017  . Dyspnea 01/09/2017  . Leukocytosis 01/09/2017  . Pleural effusion on right 01/08/2017  . Pericardial effusion 01/08/2017  . Encounter for antineoplastic chemotherapy 11/20/2016  . Adenocarcinoma of left lung, stage 4 ([place]) 10/28/2016  . Goals of care, counseling/discussion 10/28/2016  . Upper airway cough syndrome 10/16/2016  . Multiple pulmonary nodules 10/15/2016    Orientation RESPIRATION BLADDER Height & Weight     Self, Time, Situation, Place  Normal Continent Weight: 229 lb 8 oz (104.1 kg) Height:  5\' 6"  (167.6 cm)  BEHAVIORAL SYMPTOMS/MOOD NEUROLOGICAL BOWEL NUTRITION STATUS  (None) (None) Continent Diet(Regular: 4 g sodium restriction.)  AMBULATORY STATUS COMMUNICATION OF NEEDS Skin   Limited Assist Verbally Surgical wounds, Other (Comment)(MASD.)                       Personal Care Assistance Level of  Assistance  Bathing, Feeding, Dressing Bathing Assistance: Maximum assistance Feeding assistance: Independent Dressing Assistance: Maximum assistance     Functional Limitations Info  Sight, Hearing, Speech Sight Info: Adequate Hearing Info: Adequate Speech Info: Adequate    SPECIAL CARE FACTORS FREQUENCY  PT (By licensed PT), Blood pressure, OT (By licensed OT)     PT Frequency: 5 x week OT Frequency: 5 x week            Contractures Contractures Info: Not present    Additional Factors Info  Code Status, Allergies Code Status Info: Partial: No CPR Allergies Info: Percoset (Oxycodone-acetaminophen), Lisinopril, Losartan, Valsartan           Current Medications (01/16/2017):  This is the current hospital active medication list Current Facility-Administered Medications  Medication Dose Route Frequency Provider Last Rate Last Dose  . 0.9 %  sodium chloride infusion   Intravenous Continuous Patrecia Pour, Christean Grief, MD 50 mL/hr at 01/15/17 2115    . afatinib dimaleate (GILOTRIF) tablet 40 mg  40 mg Oral Daily Curt Bears, MD   40 mg at 01/15/17 1541  . albuterol (PROVENTIL) (2.5 MG/3ML) 0.083% nebulizer solution 2.5 mg  2.5 mg Nebulization Q6H PRN [name], MD      . ammonium lactate (LAC-HYDRIN) 12 % lotion   Topical PRN Patrecia Pour, Christean Grief, MD      . bisacodyl (DULCOLAX) EC tablet 10 mg  10 mg Oral Daily Lars Pinks M, PA-C   10 mg at  01/13/17 0910  . cefdinir (OMNICEF) 125 MG/5ML suspension 250 mg  250 mg Oral BID Patrecia Pour, Christean Grief, MD   250 mg at 01/16/17 1053  . clindamycin (CLINDAGEL) 1 % gel   Topical BID [name], MD      . dextromethorphan-guaiFENesin [facility] DM) 30-600 MG per 12 hr tablet 1 tablet  1 tablet Oral BID PRN [name], MD      . feeding supplement (BOOST / RESOURCE BREEZE) liquid 1 Container  1 Container Oral TID BM Doreatha Lew, MD   1 Container at 01/16/17 1238  . furosemide (LASIX) tablet 40 mg  40 mg Oral Daily Patrecia Pour,  Christean Grief, MD   40 mg at 01/16/17 1053  . hydrALAZINE (APRESOLINE) injection 5 mg  5 mg Intravenous Q2H PRN [name], MD      . loperamide (IMODIUM) 1 MG/5ML solution 2 mg  2 mg Oral PRN [name], MD      . mupirocin ointment (BACTROBAN) 2 %   Nasal BID Patrecia Pour, Christean Grief, MD      . oxyCODONE (Oxy IR/ROXICODONE) immediate release tablet 10 mg  10 mg Oral Q4H PRN Patrecia Pour, Christean Grief, MD   10 mg at 01/16/17 1053  . pantoprazole (PROTONIX) EC tablet 40 mg  40 mg Oral Q1200 Nani Skillern, PA-C   40 mg at 01/16/17 1244  . senna-docusate (Senokot-S) tablet 1 tablet  1 tablet Oral QHS Lars Pinks M, PA-C   1 tablet at 01/14/17 2133  . zolpidem (AMBIEN) tablet 5 mg  5 mg Oral QHS PRN [name], MD         Discharge Medications: Please see discharge summary for a list of discharge medications.  Relevant Imaging Results:  Relevant Lab Results:   Additional Information SS#: [id number]. Currently has one chest tube but it will be discontinued before discharge.  Candie Chroman, LCSW

## 2017-01-16 NOTE — Progress Notes (Signed)
Occupational Therapy Treatment Patient Details Name: Nancy Moore MRN: [id number] DOB: Mar 05, 1965 Today's Date: 01/16/2017    History of present illness Pt is a 51 y.o. female with PMH significant for stage IVlung adenocarcinoma and HTN, who presented to ED on 01/09/17 with worsening SOB. Pt recently had a re-staging chest CT which showed a large, complex pericardial effusion and large right pleural effusion. Now s/p subxyphoid pericardial window and R chest tube placement on 12/15.    OT comments  Pt requires max a for LB ADLs, and min A for functional transfers as she demonstrated LOB as she fatigued.   She is extremely motivated to increase her endurance, and regain her independence.  Her performance is variable due to fatigue and medication effects.   She lives alone, and will have family that provide light assist as needed.  At present time do not feel she has the functional endurance to maintain herself over a 24 hour period.  As she fatigues she is at risk of falls/injury, decompensation, and malnutrition due to being too fatigued to prepare and/or eat meals.   Feel she would benefit from a brief CIR stay to allow her maximize her independence and safety with ADLs prior to returning home.  Will continue to follow.   Follow Up Recommendations  CIR;Supervision/Assistance - 24 hour    Equipment Recommendations  3 in 1 bedside commode;Tub/shower bench    Recommendations for Other Services      Precautions / Restrictions Precautions Precautions: Fall Precaution Comments: chest tube        Mobility Bed Mobility               General bed mobility comments: Pt up in chair   Transfers Overall transfer level: Needs assistance Equipment used: None Transfers: Sit to/from Stand;Stand Pivot Transfers Sit to Stand: Min guard Stand pivot transfers: Min assist       General transfer comment: min A to steady     Balance Overall balance assessment: Needs  assistance Sitting-balance support: No upper extremity supported;Feet supported Sitting balance-Leahy Scale: Fair     Standing balance support: No upper extremity supported;Single extremity supported;During functional activity Standing balance-Leahy Scale: Fair Standing balance comment: statically stands with min guard assist                            ADL either performed or assessed with clinical judgement   ADL Overall ADL's : Needs assistance/impaired             Lower Body Bathing: Maximal assistance;Sit to/from stand       Lower Body Dressing: Maximal assistance;Sit to/from stand   Toilet Transfer: Minimal assistance;Ambulation;BSC   Toileting- Clothing Manipulation and Hygiene: Minimal assistance;Sit to/from stand       Functional mobility during ADLs: Minimal assistance General ADL Comments: Pt with DOE 3/4 with ADL activity      Vision       Perception     Praxis      Cognition Arousal/Alertness: Awake/alert Behavior During Therapy: WFL for tasks assessed/performed Overall Cognitive Status: Within Functional Limits for tasks assessed                                          Exercises     Shoulder Instructions       General Comments HR to 126.  Discussed  pacing and taking rest breaks with pt     Pertinent Vitals/ Pain       Pain Assessment: Faces Faces Pain Scale: Hurts little more Pain Location: chest tube site Pain Descriptors / Indicators: Aching;Grimacing Pain Intervention(s): Monitored during session  Home Living                                          Prior Functioning/Environment              Frequency  Min 2X/week        Progress Toward Goals  OT Goals(current goals can now be found in the care plan section)  Progress towards OT goals: Progressing toward goals     Plan Discharge plan remains appropriate    Co-evaluation                 AM-PAC PT "6 Clicks"  Daily Activity     Outcome Measure   Help from another person eating meals?: A Little Help from another person taking care of personal grooming?: A Little Help from another person toileting, which includes using toliet, bedpan, or urinal?: A Little Help from another person bathing (including washing, rinsing, drying)?: A Lot Help from another person to put on and taking off regular upper body clothing?: A Little Help from another person to put on and taking off regular lower body clothing?: A Lot 6 Click Score: 16    End of Session    OT Visit Diagnosis: Unsteadiness on feet (R26.81)   Activity Tolerance Patient tolerated treatment well   Patient Left in chair;with call bell/phone within reach;with family/visitor present   Nurse Communication Mobility status        Time: 5188-4166 OT Time Calculation (min): 36 min  Charges: OT General Charges $OT Visit: 1 Visit OT Treatments $Self Care/Home Management : 8-22 mins $Therapeutic Activity: 23-37 mins  [facility], OTR/L [phone]    Lucille Passy M 01/16/2017, 4:11 PM

## 2017-01-16 NOTE — Clinical Social Work Note (Signed)
Patient is agreeable to SNF if she is unable to go to CIR. [facility] is first preference. CSW provided SNF list for review.  Dayton Scrape, [place]

## 2017-01-16 NOTE — Progress Notes (Signed)
I sat and chatted with Nancy Moore for over an hour.    She still wants her sister Chante as her 7 (Paper work yet to be done!).  Mar[place]a understands her diagnosis is terminal.  She has been trying to prepare her family.     She is partial code because she still has the energy and determination to keep fighting, but she is very reasonable.  If she declines and a doctor gives her a logical explanation about why it would not make sense to intubate her - I believe she would change her code status to DNR.  I would like to have the opportunity to speak with Chante and Rexanna together.  Unfortunately Chante hurt her leg in a recent fall and has been unable to come to the hospital.  We discussed what happens after death.  She is very peaceful about it.  She is still wrestling with whether or not Talc Pleurodesis would benefit her.  She is afraid it will not work and afraid of the pain.  I promised her some written information about it.  Goals are established.  Full scope treatment.  Full code but no chest compressions.    I recommend outpatient Palliative Care follow her after discharge.  When she discharges to home please ask case management to arrange it.   If she discharges to SNF please put "Palliative Care to follow at SNF" in the discharge instructions to the SNF physician.  PMT will sign off for now.  Please call us back when we can be of further assistance. [place], PA-C Palliative Medicine Pager: [phone]

## 2017-01-17 ENCOUNTER — Inpatient Hospital Stay (HOSPITAL_COMMUNITY): Payer: 59

## 2017-01-17 LAB — BASIC METABOLIC PANEL
Anion gap: 5 (ref 5–15)
BUN: 6 mg/dL (ref 6–20)
CO2: 29 mmol/L (ref 22–32)
Calcium: 8 mg/dL — ABNORMAL LOW (ref 8.9–10.3)
Chloride: 100 mmol/L — ABNORMAL LOW (ref 101–111)
Creatinine, Ser: 0.76 mg/dL (ref 0.44–1.00)
GFR calc Af Amer: >60 mL/min (ref >60)
GFR calc non Af Amer: >60 mL/min (ref >60)
Glucose, Bld: 118 mg/dL — ABNORMAL HIGH (ref 65–99)
Potassium: 3.4 mmol/L — ABNORMAL LOW (ref 3.5–5.1)
Sodium: 134 mmol/L — ABNORMAL LOW (ref 135–145)

## 2017-01-17 LAB — MAGNESIUM: Magnesium: 1.7 mg/dL (ref 1.7–2.4)

## 2017-01-17 MED ORDER — MAGNESIUM SULFATE 2 GM/50ML IV SOLN
2.0000 g | Freq: Once | INTRAVENOUS | Status: AC
  Administered 2017-01-17: 2 g via INTRAVENOUS
  Filled 2017-01-17: qty 50

## 2017-01-17 MED ORDER — POTASSIUM CHLORIDE CRYS ER 20 MEQ PO TBCR
40.0000 meq | EXTENDED_RELEASE_TABLET | ORAL | Status: AC
  Administered 2017-01-17 (×2): 40 meq via ORAL
  Filled 2017-01-17 (×2): qty 2

## 2017-01-17 NOTE — Progress Notes (Addendum)
      [address]       [place] [address]             [phone]      7 Days Post-Op Procedure(s) (LRB): PERICARDIAL WINDOW- SUB XYPHOID, RIGHT CHEST TUBE (N/A)   Subjective:  Ms. Mortell states she is doing well.  She denies N/V, shortness of breath.  She continues to remain hesitant about the tacl pleurodesis  Objective: Vital signs in last 24 hours: Temp:  [98.1 F (36.7 C)-99.2 F (37.3 C)] 98.2 F (36.8 C) (12/22 0846) Pulse Rate:  [101-112] 106 (12/22 0349) Cardiac Rhythm: Sinus tachycardia (12/22 0846) Resp:  [13-16] 13 (12/22 0349) BP: (90-112)/(59-72) 108/59 (12/22 0349) SpO2:  [92 %-99 %] 92 % (12/22 0349) Weight:  [231 lb 11.3 oz (105.1 kg)] 231 lb 11.3 oz (105.1 kg) (12/22 0500)  Intake/Output from previous day: 12/21 0701 - 12/22 0700 In: 2298.3 [I.V.:2298.3] Out: 855 [Urine:825; Chest Tube:30]  General appearance: alert, cooperative and no distress Heart: regular rate and rhythm Lungs: clear to auscultation bilaterally Abdomen: soft, non-tender; bowel sounds normal; no masses,  no organomegaly Extremities: edema 1+  Wound: clean and dry  Lab Results: Recent Labs    01/15/17 0615 01/16/17 0426  WBC 13.4* 11.3*  HGB 10.8* 10.4*  HCT 34.1* 32.5*  PLT 302 283   BMET:  Recent Labs    01/16/17 0426 01/17/17 0431  NA 134* 134*  K 3.4* 3.4*  CL 102 100*  CO2 28 29  GLUCOSE 108* 118*  BUN 7 6  CREATININE 0.81 0.76  CALCIUM 8.3* 8.0*    PT/INR: No results for input(s): LABPROT, INR in the last 72 hours. ABG    Component Value Date/Time   PHART 7.399 01/11/2017 0422   HCO3 27.3 01/11/2017 0422   TCO2 24 01/10/2017 0234   O2SAT 97.0 01/11/2017 0422   CBG (last 3)  No results for input(s): GLUCAP in the last 72 hours.  Assessment/Plan: S/P Procedure(s) (LRB): PERICARDIAL WINDOW- SUB XYPHOID, RIGHT CHEST TUBE (N/A)  1. Pleural Chest tube- 210 cc output yesterday (level at 1600 this morning)- will leave chst tube in  place, output remains too high for pleurodesis 2. Pulm- not on oxygen, continue IS 3. Dispo- patient stable, CT output is decreasing, will plan pleurodesis if patient is agreeable once chest tube output reaches 150 cc, care per medicine   LOS: 8 days    Ellwood Handler 01/17/2017 Patient seen and examined, agree with above  Remo Lipps C. Roxan Hockey, MD [facility] [phone]

## 2017-01-17 NOTE — Progress Notes (Signed)
PROGRESS NOTE [facility]   Nancy Moore   FYT:[id number] DOB: 07-Jan-1966  DOA: 01/09/2017 PCP: Darreld Mclean, MD   Brief Narrative:  Nancy Moore is a 51 year old female with medical history significant for stage IV lung cancer, hypertension who presented to the emergency department complaining of severe shortness of breath. She recently had a re-staging CT scan of her chest which showed a large, complex pericardial effusion as well as a large right pleural effusion. An urgent referral to cardiology was done and Dr. Debara Pickett  performed a 2D Echo, which showed massive pericardial effusion with signs of early tamponade. She was sent to ED further evaluation and treatment.  She is admitted pericardial effusion with early tamponade underwent pericardial window with R chest tube placement. During hospital stay was treated for UTI. PT evaluated patient and is recommending CIR   Subjective: No new complaints. Breathing is ok , pain is well controlled. No acute events overnight.     Assessment & Plan: Pericardial effusion and pleural effusion - continues to improve  Likely secondary to malignancy.  S/p pericardial window and R chest tube 01/10/17 Pathology, pericardial biopsy negative for malignancy, Fluid cytology positive for malignant cells  Continue management per TCTS, planning for talc pleurodesis when output < 150 cc after tube removal   Adenocarcinoma of left lung, stage IV Patient followed by Dr. Earlie Server as an outpatient Continue Afatinib Oncology recommending to continue GILOTRIF as well   Hypertension Continue to be stable  Continue current regimen    Hypokalemia/HypoMag+  Replete  Check BMP and Mag in Am   Acute renal failure - Resolved and stable  Likely due to Lasix  Lasix d/ced but can use Lasix PRN if leg edema  Monitor Cr    UTI - Klebsiella  Completed course of antibiotic today  Remains afebrile   General deconditioning  CIR   DVT prophylaxis: TED  Hose, SCD's  Code Status: Full code Family Communication: Family at bedside Disposition Plan: When clear by TCTS  Consultants:   TCTS - Dr Prescott Gum   Procedures:   None  Antimicrobials:  None   Objective: Vitals:   01/17/17 0001 01/17/17 0349 01/17/17 0500 01/17/17 0846  BP: 112/70 (!) 108/59    Pulse: (!) 112 (!) 106    Resp: 14 13    Temp: 98.3 F (36.8 C) 98.7 F (37.1 C)  98.2 F (36.8 C)  TempSrc: Oral Oral  Oral  SpO2: 95% 92%    Weight:   105.1 kg (231 lb 11.3 oz)   Height:        Intake/Output Summary (Last 24 hours) at 01/17/2017 0946 Last data filed at 01/17/2017 1771 Gross per 24 hour  Intake 2298.33 ml  Output 655 ml  Net 1643.33 ml   Filed Weights   01/15/17 0530 01/16/17 0430 01/17/17 0500  Weight: 109.2 kg (240 lb 11.9 oz) 104.1 kg (229 lb 8 oz) 105.1 kg (231 lb 11.3 oz)    Examination:   General: NAD  Cardiovascular: RRR, S1/S2 +, no rubs, no gallops Respiratory: Good air entry, rales at the L base. No wheezing  Abdominal: Soft, NT, ND, bowel sounds + Extremities: Trace LE edema   Data Reviewed: I have personally reviewed following labs and imaging studies  CBC: Recent Labs  Lab 01/12/17 0529 01/13/17 0847 01/14/17 0402 01/15/17 0615 01/16/17 0426  WBC 18.8* 16.4* 12.9* 13.4* 11.3*  NEUTROABS 14.7* 11.1* 7.9* 8.7* 7.3  HGB 9.3* 10.3* 10.6* 10.8* 10.4*  HCT  29.8* 32.2* 33.2* 34.1* 32.5*  MCV 83.0 82.4 81.8 82.8 82.9  PLT 248 266 282 302 242   Basic Metabolic Panel: Recent Labs  Lab 01/13/17 0847 01/14/17 0402 01/15/17 0615 01/16/17 0426 01/17/17 0431  NA 134* 135 133* 134* 134*  K 3.7 3.2* 3.2* 3.4* 3.4*  CL 102 101 102 102 100*  CO2 26 26 27 28 29   GLUCOSE 85 91 102* 108* 118*  BUN 13 11 6 7 6   CREATININE 0.93 0.89 0.79 0.81 0.76  CALCIUM 8.5* 8.2* 8.0* 8.3* 8.0*  MG 1.7 1.5* 1.5* 1.8 1.7   GFR: Estimated Creatinine Clearance: 103.1 mL/min (by C-G formula based on SCr of 0.76 mg/dL). Liver Function  Tests: Recent Labs  Lab 01/11/17 0930 01/12/17 0529  AST  --  23  ALT  --  22  ALKPHOS  --  57  BILITOT  --  1.3*  PROT  --  5.2*  ALBUMIN 3.0* 2.6*   No results for input(s): LIPASE, AMYLASE in the last 168 hours. No results for input(s): AMMONIA in the last 168 hours. Coagulation Profile: No results for input(s): INR, PROTIME in the last 168 hours. Cardiac Enzymes: No results for input(s): CKTOTAL, CKMB, CKMBINDEX, TROPONINI in the last 168 hours. BNP (last 3 results) No results for input(s): PROBNP in the last 8760 hours. HbA1C: No results for input(s): HGBA1C in the last 72 hours. CBG: Recent Labs  Lab 01/11/17 0755  GLUCAP 125*   Lipid Profile: No results for input(s): CHOL, HDL, LDLCALC, TRIG, CHOLHDL, LDLDIRECT in the last 72 hours. Thyroid Function Tests: No results for input(s): TSH, T4TOTAL, FREET4, T3FREE, THYROIDAB in the last 72 hours. Anemia Panel: No results for input(s): VITAMINB12, FOLATE, FERRITIN, TIBC, IRON, RETICCTPCT in the last 72 hours. Sepsis Labs: No results for input(s): PROCALCITON, LATICACIDVEN in the last 168 hours.  Recent Results (from the past 240 hour(s))  Culture, blood (Routine X 2) w Reflex to ID Panel     Status: None   Collection Time: 01/09/17 10:35 PM  Result Value Ref Range Status   Specimen Description BLOOD RIGHT HAND  Final   Special Requests IN PEDIATRIC BOTTLE Blood Culture adequate volume  Final   Culture NO GROWTH 5 DAYS  Final   Report Status 01/15/2017 FINAL  Final  Culture, blood (Routine X 2) w Reflex to ID Panel     Status: None   Collection Time: 01/09/17 10:50 PM  Result Value Ref Range Status   Specimen Description BLOOD LEFT HAND  Final   Special Requests IN PEDIATRIC BOTTLE Blood Culture adequate volume  Final   Culture NO GROWTH 5 DAYS  Final   Report Status 01/15/2017 FINAL  Final  Surgical pcr screen     Status: Abnormal   Collection Time: 01/09/17 11:10 PM  Result Value Ref Range Status   MRSA, PCR  NEGATIVE NEGATIVE Final   Staphylococcus aureus POSITIVE (A) NEGATIVE Final    Comment: (NOTE) The Xpert SA Assay (FDA approved for NASAL specimens in patients 80 years of age and older), is one component of a comprehensive surveillance program. It is not intended to diagnose infection nor to guide or monitor treatment.   Urine Culture     Status: Abnormal   Collection Time: 01/10/17  6:25 AM  Result Value Ref Range Status   Specimen Description URINE, RANDOM  Final   Special Requests NONE  Final   Culture >=100,000 COLONIES/mL KLEBSIELLA OXYTOCA (A)  Final   Report Status 01/13/2017 FINAL  Final   Organism ID, Bacteria KLEBSIELLA OXYTOCA (A)  Final      Susceptibility   Klebsiella oxytoca - MIC*    AMPICILLIN >=32 RESISTANT Resistant     CEFAZOLIN <=4 SENSITIVE Sensitive     CEFTRIAXONE <=1 SENSITIVE Sensitive     CIPROFLOXACIN <=0.25 SENSITIVE Sensitive     GENTAMICIN <=1 SENSITIVE Sensitive     IMIPENEM <=0.25 SENSITIVE Sensitive     NITROFURANTOIN <=16 SENSITIVE Sensitive     TRIMETH/SULFA <=20 SENSITIVE Sensitive     AMPICILLIN/SULBACTAM 8 SENSITIVE Sensitive     PIP/TAZO <=4 SENSITIVE Sensitive     Extended ESBL NEGATIVE Sensitive     * >=100,000 COLONIES/mL KLEBSIELLA OXYTOCA      Radiology Studies: Dg Chest Port 1 View  Result Date: 01/17/2017 CLINICAL DATA:  Chest tube EXAM: PORTABLE CHEST 1 VIEW COMPARISON:  Yesterday FINDINGS: Right-sided chest tube in stable position. Trace right apical pneumothorax without increase. Right IJ line with tip at the upper right atrium. Pleural fluid and airspace opacity on the left where there is low volumes. Recent pericardial drain. Stable cardiopericardial enlargement. IMPRESSION: 1. Small right apical pneumothorax with stable chest tube positioning. 2. Recent pericardial drain.  Stable cardiopericardial size. 3. Unchanged pleural and parenchymal opacity on the left. Electronically Signed   By: Monte Fantasia M.D.   On: 01/17/2017  07:00   Dg Chest Port 1 View  Result Date: 01/16/2017 CLINICAL DATA:  Chest tube EXAM: PORTABLE CHEST 1 VIEW COMPARISON:  Yesterday FINDINGS: Unchanged small right apical pneumothorax. Right IJ line with tip at the upper right atrium. Unchanged left pleural effusion and left lung opacity. Pericardial drain has been removed. Stable cardiopericardial enlargement. IMPRESSION: 1. Unchanged small right apical pneumothorax. 2. Stable cardiopericardial size after pericardial drain removal. 3. Left pleural effusion and lung opacity. Electronically Signed   By: Monte Fantasia M.D.   On: 01/16/2017 09:05    Scheduled Meds: . afatinib dimaleate  40 mg Oral Daily  . bisacodyl  10 mg Oral Daily  . cefdinir  250 mg Oral BID  . clindamycin   Topical BID  . feeding supplement  1 Container Oral TID BM  . furosemide  40 mg Oral Daily  . mupirocin ointment   Nasal BID  . pantoprazole  40 mg Oral Q1200  . potassium chloride  40 mEq Oral Q4H  . senna-docusate  1 tablet Oral QHS   Continuous Infusions: . sodium chloride 50 mL/hr at 01/17/17 0326  . magnesium sulfate 1 - 4 g bolus IVPB 2 g (01/17/17 0937)     LOS: 8 days    Time spent: Total of 15 minutes spent with pt, greater than 50% of which was spent in discussion of  treatment, counseling and coordination of care   Chipper Oman, MD Pager: Text Page via [email or web address]   If 7PM-7AM, please contact night-coverage [email or web address] 01/17/2017, 9:46 AM

## 2017-01-18 LAB — BASIC METABOLIC PANEL
Anion gap: 6 (ref 5–15)
BUN: 7 mg/dL (ref 6–20)
CO2: 28 mmol/L (ref 22–32)
Calcium: 8.1 mg/dL — ABNORMAL LOW (ref 8.9–10.3)
Chloride: 100 mmol/L — ABNORMAL LOW (ref 101–111)
Creatinine, Ser: 0.74 mg/dL (ref 0.44–1.00)
GFR calc Af Amer: >60 mL/min (ref >60)
GFR calc non Af Amer: >60 mL/min (ref >60)
Glucose, Bld: 95 mg/dL (ref 65–99)
Potassium: 3.6 mmol/L (ref 3.5–5.1)
Sodium: 134 mmol/L — ABNORMAL LOW (ref 135–145)

## 2017-01-18 LAB — CBC
HCT: 30.5 % — ABNORMAL LOW (ref 36.0–46.0)
Hemoglobin: 9.7 g/dL — ABNORMAL LOW (ref 12.0–15.0)
MCH: 25.9 pg — ABNORMAL LOW (ref 26.0–34.0)
MCHC: 31.8 g/dL (ref 30.0–36.0)
MCV: 81.3 fL (ref 78.0–100.0)
Platelets: 292 10*3/uL (ref 150–400)
RBC: 3.75 MIL/uL — ABNORMAL LOW (ref 3.87–5.11)
RDW: 16.8 % — ABNORMAL HIGH (ref 11.5–15.5)
WBC: 9 10*3/uL (ref 4.0–10.5)

## 2017-01-18 LAB — MAGNESIUM: Magnesium: 1.7 mg/dL (ref 1.7–2.4)

## 2017-01-18 NOTE — Progress Notes (Addendum)
      [address]       [place] [address]             [phone]      8 Days Post-Op Procedure(s) (LRB): PERICARDIAL WINDOW- SUB XYPHOID, RIGHT CHEST TUBE (N/A)   Subjective:  No new complaints.  Continues to feel okay.  She is again not agreeable to talc pleurodesis today.  She understands it needs done she is just trying to wrap her head around it.  She also states she had a lot of leakage around her chest tube yesterday and would like there to be a more accurate reading of output before she agrees to proceed.  Objective: Vital signs in last 24 hours: Temp:  [97.7 F (36.5 C)-98.5 F (36.9 C)] 98.1 F (36.7 C) (12/23 0714) Pulse Rate:  [102-110] 106 (12/23 0714) Cardiac Rhythm: Sinus tachycardia (12/23 0900) Resp:  [12-15] 13 (12/23 0714) BP: (100-109)/(62-79) 103/65 (12/23 0450) SpO2:  [94 %-100 %] 95 % (12/23 0714) Weight:  [233 lb 4 oz (105.8 kg)] 233 lb 4 oz (105.8 kg) (12/23 0628)  Intake/Output from previous day: 12/22 0701 - 12/23 0700 In: 1641.7 [P.O.:440; I.V.:1201.7] Out: 1015 [Urine:765; Chest Tube:250]  General appearance: alert, cooperative and no distress Heart: regular rate and rhythm Lungs: clear to auscultation bilaterally Abdomen: soft, non-tender; bowel sounds normal; no masses,  no organomegaly Wound: clean and dry  Lab Results: Recent Labs    01/16/17 0426 01/18/17 0500  WBC 11.3* 9.0  HGB 10.4* 9.7*  HCT 32.5* 30.5*  PLT 283 292   BMET:  Recent Labs    01/17/17 0431 01/18/17 0500  NA 134* 134*  K 3.4* 3.6  CL 100* 100*  CO2 29 28  GLUCOSE 118* 95  BUN 6 7  CREATININE 0.76 0.74  CALCIUM 8.0* 8.1*    PT/INR: No results for input(s): LABPROT, INR in the last 72 hours. ABG    Component Value Date/Time   PHART 7.399 01/11/2017 0422   HCO3 27.3 01/11/2017 0422   TCO2 24 01/10/2017 0234   O2SAT 97.0 01/11/2017 0422   CBG (last 3)  No results for input(s): GLUCAP in the last 72 hours.  Assessment/Plan: S/P  Procedure(s) (LRB): PERICARDIAL WINDOW- SUB XYPHOID, RIGHT CHEST TUBE (N/A)  1. Chest tube- 210 cc output yesterday, continues to improve (level is at 1800 on pleurovac)- leave on water seal today... In regards to leakage around chest tube site dressing is dry this morning 2. Dispo- care per primary, continue chest tube, will place talc when patient is ready to proceed   LOS: 9 days    Ellwood Handler 01/18/2017 Patient seen and examined, agree with above I think she is incredibly anxious about concept of talc pleurodesis and is looking for reasons not to do it. Drainage remains a little higher than ideal. Will keep CT in place today. Kines option may be to dc tube and then place pleur-x catheter if effusion recurs.  Revonda Standard Roxan Hockey, MD [facility] [phone]

## 2017-01-18 NOTE — Progress Notes (Signed)
[facility] PROGRESS NOTE  Terriann Difonzo Heppler QPR:[id number] DOB: 1965/08/29 DOA: 01/09/2017 PCP: Darreld Mclean, MD  Brief summary   51 year old female with medical history significant for stage IV lung cancer, hypertension who presented to the emergency department complaining of severe shortness of breath. She recently had a re-staging CT scan of her chest which showed a large, complex pericardial effusion as well as a large right pleural effusion. An urgent referral to cardiology was done and Dr. Debara Pickett  performed a 2D Echo, which showed massive pericardial effusion with signs of early tamponade. She was sent to ED further evaluation and treatment.  She is admitted pericardial effusion with early tamponade underwent pericardial window with R chest tube placement. During hospital stay was treated for UTI. PT evaluated patient and is recommending CIR    Assessment/Plan:  Pericardial effusion and pleural effusion - continues to improve. Likely secondary to malignancy. S/p pericardial window and R chest tube 01/10/17. Pathology, pericardial biopsy negative for malignancy, Fluid cytology positive for malignant cells.  -continue management per TCTS, planning for talc pleurodesis when output < 150 cc after tube removal   Adenocarcinoma of left lung, stage IV. Patient followed by Dr. Earlie Server as an outpatient.  -Continue Afatinib Oncology recommending to continue GILOTRIF as well   Hypertension. Continue to be stable. Continue current regimen    Hypokalemia/HypoMag+ . Replete. Monitor   Mild Acute renal failure - Resolved and stable. Likely due to Lasix. Monitor Cr    UTI - Klebsiella. Completed course of antibiotics. Remains afebrile   Diffuse edema/anasarca. D/c IVF. Cont lasix. May need iv diuresis for few days   General deconditioning. CIR   DVT prophylaxis: TED Hose, SCD's  Code Status: Full code Family Communication: Family at bedside Disposition Plan: When clear by  TCTS  Consultants:   TCTS - Dr Prescott Gum    Procedures:  Pericardial window/chest tube   Antibiotics: Antibiotics Given (last 72 hours)    Date/Time Action Medication Dose   01/15/17 0912 Given   cefdinir (OMNICEF) 125 MG/5ML suspension 250 mg 250 mg   01/15/17 2153 Given   cefdinir (OMNICEF) 125 MG/5ML suspension 250 mg 250 mg   01/16/17 1053 Given   cefdinir (OMNICEF) 125 MG/5ML suspension 250 mg 250 mg   01/16/17 2205 Given   cefdinir (OMNICEF) 125 MG/5ML suspension 250 mg 250 mg   01/17/17 9935 Given   cefdinir (OMNICEF) 125 MG/5ML suspension 250 mg 250 mg   01/17/17 2115 Given   cefdinir (OMNICEF) 125 MG/5ML suspension 250 mg 250 mg        (indicate start date, and stop date if known)  HPI/Subjective: Alert. Reports feeling better. No acute shortness of breath. Still edematous/legs.   Objective: Vitals:   01/18/17 0450 01/18/17 0714  BP: 103/65   Pulse: (!) 102 (!) 106  Resp: 12 13  Temp: 98.5 F (36.9 C) 98.1 F (36.7 C)  SpO2: 95% 95%    Intake/Output Summary (Last 24 hours) at 01/18/2017 0804 Last data filed at 01/18/2017 7017 Gross per 24 hour  Intake 1440 ml  Output 1015 ml  Net 425 ml   Filed Weights   01/16/17 0430 01/17/17 0500 01/18/17 0628  Weight: 104.1 kg (229 lb 8 oz) 105.1 kg (231 lb 11.3 oz) 105.8 kg (233 lb 4 oz)    Exam:   General:  No distress   Cardiovascular: s1,s2 rrr  Respiratory: diminished LL  Abdomen: soft. Nt. nd   Musculoskeletal: + edema legs    Data  Reviewed: Basic Metabolic Panel: Recent Labs  Lab 01/14/17 0402 01/15/17 0615 01/16/17 0426 01/17/17 0431 01/18/17 0500  NA 135 133* 134* 134* 134*  K 3.2* 3.2* 3.4* 3.4* 3.6  CL 101 102 102 100* 100*  CO2 26 27 28 29 28   GLUCOSE 91 102* 108* 118* 95  BUN 11 6 7 6 7   CREATININE 0.89 0.79 0.81 0.76 0.74  CALCIUM 8.2* 8.0* 8.3* 8.0* 8.1*  MG 1.5* 1.5* 1.8 1.7 1.7   Liver Function Tests: Recent Labs  Lab 01/11/17 0930 01/12/17 0529  AST  --  23   ALT  --  22  ALKPHOS  --  57  BILITOT  --  1.3*  PROT  --  5.2*  ALBUMIN 3.0* 2.6*   No results for input(s): LIPASE, AMYLASE in the last 168 hours. No results for input(s): AMMONIA in the last 168 hours. CBC: Recent Labs  Lab 01/12/17 0529 01/13/17 0847 01/14/17 0402 01/15/17 0615 01/16/17 0426 01/18/17 0500  WBC 18.8* 16.4* 12.9* 13.4* 11.3* 9.0  NEUTROABS 14.7* 11.1* 7.9* 8.7* 7.3  --   HGB 9.3* 10.3* 10.6* 10.8* 10.4* 9.7*  HCT 29.8* 32.2* 33.2* 34.1* 32.5* 30.5*  MCV 83.0 82.4 81.8 82.8 82.9 81.3  PLT 248 266 282 302 283 292   Cardiac Enzymes: No results for input(s): CKTOTAL, CKMB, CKMBINDEX, TROPONINI in the last 168 hours. BNP (last 3 results) No results for input(s): BNP in the last 8760 hours.  ProBNP (last 3 results) No results for input(s): PROBNP in the last 8760 hours.  CBG: No results for input(s): GLUCAP in the last 168 hours.  Recent Results (from the past 240 hour(s))  Culture, blood (Routine X 2) w Reflex to ID Panel     Status: None   Collection Time: 01/09/17 10:35 PM  Result Value Ref Range Status   Specimen Description BLOOD RIGHT HAND  Final   Special Requests IN PEDIATRIC BOTTLE Blood Culture adequate volume  Final   Culture NO GROWTH 5 DAYS  Final   Report Status 01/15/2017 FINAL  Final  Culture, blood (Routine X 2) w Reflex to ID Panel     Status: None   Collection Time: 01/09/17 10:50 PM  Result Value Ref Range Status   Specimen Description BLOOD LEFT HAND  Final   Special Requests IN PEDIATRIC BOTTLE Blood Culture adequate volume  Final   Culture NO GROWTH 5 DAYS  Final   Report Status 01/15/2017 FINAL  Final  Surgical pcr screen     Status: Abnormal   Collection Time: 01/09/17 11:10 PM  Result Value Ref Range Status   MRSA, PCR NEGATIVE NEGATIVE Final   Staphylococcus aureus POSITIVE (A) NEGATIVE Final    Comment: (NOTE) The Xpert SA Assay (FDA approved for NASAL specimens in patients 59 years of age and older), is one component  of a comprehensive surveillance program. It is not intended to diagnose infection nor to guide or monitor treatment.   Urine Culture     Status: Abnormal   Collection Time: 01/10/17  6:25 AM  Result Value Ref Range Status   Specimen Description URINE, RANDOM  Final   Special Requests NONE  Final   Culture >=100,000 COLONIES/mL KLEBSIELLA OXYTOCA (A)  Final   Report Status 01/13/2017 FINAL  Final   Organism ID, Bacteria KLEBSIELLA OXYTOCA (A)  Final      Susceptibility   Klebsiella oxytoca - MIC*    AMPICILLIN >=32 RESISTANT Resistant     CEFAZOLIN <=4 SENSITIVE Sensitive  CEFTRIAXONE <=1 SENSITIVE Sensitive     CIPROFLOXACIN <=0.25 SENSITIVE Sensitive     GENTAMICIN <=1 SENSITIVE Sensitive     IMIPENEM <=0.25 SENSITIVE Sensitive     NITROFURANTOIN <=16 SENSITIVE Sensitive     TRIMETH/SULFA <=20 SENSITIVE Sensitive     AMPICILLIN/SULBACTAM 8 SENSITIVE Sensitive     PIP/TAZO <=4 SENSITIVE Sensitive     Extended ESBL NEGATIVE Sensitive     * >=100,000 COLONIES/mL KLEBSIELLA OXYTOCA     Studies: Dg Chest Port 1 View  Result Date: 01/17/2017 CLINICAL DATA:  Chest tube EXAM: PORTABLE CHEST 1 VIEW COMPARISON:  Yesterday FINDINGS: Right-sided chest tube in stable position. Trace right apical pneumothorax without increase. Right IJ line with tip at the upper right atrium. Pleural fluid and airspace opacity on the left where there is low volumes. Recent pericardial drain. Stable cardiopericardial enlargement. IMPRESSION: 1. Small right apical pneumothorax with stable chest tube positioning. 2. Recent pericardial drain.  Stable cardiopericardial size. 3. Unchanged pleural and parenchymal opacity on the left. Electronically Signed   By: Monte Fantasia M.D.   On: 01/17/2017 07:00    Scheduled Meds: . afatinib dimaleate  40 mg Oral Daily  . bisacodyl  10 mg Oral Daily  . clindamycin   Topical BID  . feeding supplement  1 Container Oral TID BM  . furosemide  40 mg Oral Daily  .  mupirocin ointment   Nasal BID  . pantoprazole  40 mg Oral Q1200  . senna-docusate  1 tablet Oral QHS   Continuous Infusions: . sodium chloride 50 mL/hr at 01/17/17 1900    Principal Problem:   Pericardial effusion Active Problems:   Adenocarcinoma of left lung, stage 4 (HCC)   Essential hypertension   Pleural effusion   Hypokalemia   Dyspnea   Leukocytosis   AKI (acute kidney injury) ([place])   Palliative care encounter   Chest tube in place   Acute lower UTI   Pain   Benign essential HTN   Acute blood loss anemia   Cardiac/pericardial tamponade    Time spent: >35 minutes     Kinnie Feil  [facility] Pager [phone]. If 7PM-7AM, please contact night-coverage at [email or web address], password [facility] 01/18/2017, 8:04 AM  LOS: 9 days

## 2017-01-19 DIAGNOSIS — I5021 Acute systolic (congestive) heart failure: Secondary | ICD-10-CM

## 2017-01-19 DIAGNOSIS — C801 Malignant (primary) neoplasm, unspecified: Secondary | ICD-10-CM

## 2017-01-19 DIAGNOSIS — I318 Other specified diseases of pericardium: Secondary | ICD-10-CM

## 2017-01-19 DIAGNOSIS — J910 Malignant pleural effusion: Secondary | ICD-10-CM

## 2017-01-19 DIAGNOSIS — B961 Klebsiella pneumoniae [K. pneumoniae] as the cause of diseases classified elsewhere: Secondary | ICD-10-CM

## 2017-01-19 LAB — BASIC METABOLIC PANEL
Anion gap: 7 (ref 5–15)
BUN: 8 mg/dL (ref 6–20)
CO2: 31 mmol/L (ref 22–32)
Calcium: 8.4 mg/dL — ABNORMAL LOW (ref 8.9–10.3)
Chloride: 99 mmol/L — ABNORMAL LOW (ref 101–111)
Creatinine, Ser: 0.83 mg/dL (ref 0.44–1.00)
GFR calc Af Amer: >60 mL/min (ref >60)
GFR calc non Af Amer: >60 mL/min (ref >60)
Glucose, Bld: 97 mg/dL (ref 65–99)
Potassium: 3.7 mmol/L (ref 3.5–5.1)
Sodium: 137 mmol/L (ref 135–145)

## 2017-01-19 LAB — CBC
HCT: 30 % — ABNORMAL LOW (ref 36.0–46.0)
Hemoglobin: 9.4 g/dL — ABNORMAL LOW (ref 12.0–15.0)
MCH: 25.8 pg — ABNORMAL LOW (ref 26.0–34.0)
MCHC: 31.3 g/dL (ref 30.0–36.0)
MCV: 82.2 fL (ref 78.0–100.0)
Platelets: 312 10*3/uL (ref 150–400)
RBC: 3.65 MIL/uL — ABNORMAL LOW (ref 3.87–5.11)
RDW: 17.1 % — ABNORMAL HIGH (ref 11.5–15.5)
WBC: 10.7 10*3/uL — ABNORMAL HIGH (ref 4.0–10.5)

## 2017-01-19 MED ORDER — POTASSIUM CHLORIDE CRYS ER 20 MEQ PO TBCR
30.0000 meq | EXTENDED_RELEASE_TABLET | Freq: Once | ORAL | Status: AC
  Administered 2017-01-19: 30 meq via ORAL
  Filled 2017-01-19: qty 1

## 2017-01-19 MED ORDER — FENTANYL CITRATE (PF) 100 MCG/2ML IJ SOLN
INTRAMUSCULAR | Status: AC
  Filled 2017-01-19: qty 2

## 2017-01-19 MED ORDER — ACETAMINOPHEN 325 MG PO TABS
650.0000 mg | ORAL_TABLET | Freq: Four times a day (QID) | ORAL | Status: DC | PRN
  Administered 2017-01-20 – 2017-01-21 (×2): 650 mg via ORAL
  Filled 2017-01-19 (×3): qty 2

## 2017-01-19 MED ORDER — TALC (STERITALC) POWDER FOR INTRAPLEURAL USE
4.0000 g | Freq: Once | INTRAPLEURAL | Status: AC
  Administered 2017-01-19: 4 g via INTRAPLEURAL
  Filled 2017-01-19: qty 4

## 2017-01-19 MED ORDER — FENTANYL CITRATE (PF) 100 MCG/2ML IJ SOLN
25.0000 ug | Freq: Once | INTRAMUSCULAR | Status: AC
  Administered 2017-01-19: 25 ug via INTRAVENOUS

## 2017-01-19 NOTE — Progress Notes (Addendum)
      [address]       [place] [address]             [phone]       9 Days Post-Op Procedure(s) (LRB): PERICARDIAL WINDOW- SUB XYPHOID, RIGHT CHEST TUBE (N/A)  Subjective: Patient in good spirits this am. She states there is drainage around her chest tube this am.  Objective: Vital signs in last 24 hours: Temp:  [97.6 F (36.4 C)-99.4 F (37.4 C)] 98.1 F (36.7 C) (12/24 0829) Pulse Rate:  [104-112] 104 (12/24 0611) Cardiac Rhythm: Sinus tachycardia (12/24 0105) Resp:  [13-18] 15 (12/24 0611) BP: (90-102)/(60-66) 90/60 (12/24 0611) SpO2:  [92 %-99 %] 92 % (12/24 0611) Weight:  [231 lb 14.8 oz (105.2 kg)] 231 lb 14.8 oz (105.2 kg) (12/24 0611)     Intake/Output from previous day: 12/23 0701 - 12/24 0700 In: 240 [P.O.:240] Out: 1390 [Urine:1200; Chest Tube:190]   Physical Exam:  Cardiovascular: Slightly tachy Pulmonary: Clear to auscultation on left and slightly diminished at right base Abdomen: Soft, non tender, bowel sounds present. Extremities: Bilateral lower extremity edema. Wounds: Clean and dry.  No erythema or signs of infection. Chest tube site from subxiphoid window is open with slight sero sanguinous draniage. Chest Tube: to water seal  Lab Results: CBC: Recent Labs    01/18/17 0500 01/19/17 0600  WBC 9.0 10.7*  HGB 9.7* 9.4*  HCT 30.5* 30.0*  PLT 292 312   BMET:  Recent Labs    01/18/17 0500 01/19/17 0600  NA 134* 137  K 3.6 3.7  CL 100* 99*  CO2 28 31  GLUCOSE 95 97  BUN 7 8  CREATININE 0.74 0.83  CALCIUM 8.1* 8.4*    PT/INR: No results for input(s): LABPROT, INR in the last 72 hours. ABG:  INR: Will add last result for INR, ABG once components are confirmed Will add last 4 CBG results once components are confirmed  Assessment/Plan:  1. CV - Slightly tachycardic 2.  Pulmonary - On room air. Right chest tube with 190 cc of output last 24 hours. Per Dr. Prescott Gum, TALC this am and then remove chest tube  about 3 hours afterward. Check CXR in am. Encourage incentive spirometer. 3. Anemia-H and H 9.4 and 30 this am. 4. Supplement potassium  Nancy M ZimmermanPA-C 01/19/2017,8:43 AM   Will talc and remove R chest tube today Procedure discussed with patient including benefits and risks PAL CXR in am patient examined and medical record reviewed,agree with above note. Nancy Moore 01/19/2017

## 2017-01-19 NOTE — Progress Notes (Signed)
PT Cancellation Note  Patient Details Name: Nancy Moore MRN: [id number] DOB: 01/06/1966   Cancelled Treatment:    Reason Eval/Treat Not Completed: Patient at procedure or test/unavailable. Per PA, to hold therapies today. Pt having talc pleurodesis at this time. Will follow-up for PT treatment.  Mabeline Caras, PT, DPT Acute Rehab Services  Pager: [place] 01/19/2017, 10:57 AM

## 2017-01-19 NOTE — Progress Notes (Signed)
OT Cancellation Note  Patient Details Name: Nancy Moore MRN: [id number] DOB: 1965/12/03   Cancelled Treatment:    Reason Eval/Treat Not Completed: Patient at procedure or test/ unavailable. Per PA to hold therapies today. Pt having talc pleurodesis at this time. Will check back and continue with plan of care.   Norman Herrlich, MS OTR/L  Pager: [phone]   Norman Herrlich 01/19/2017, 10:55 AM

## 2017-01-19 NOTE — Progress Notes (Signed)
Talc slurry was ordered. Patient was given 12.5 mg of IV Fentanyl prior to TALC. TALC was placed into the right chest tube without difficulty. The remainder 12.5 mg of IV Fentanyl was then given. Patient tolerated the procedure well.

## 2017-01-19 NOTE — Progress Notes (Signed)
Inpatient Rehabilitation  Met with patient to discuss post acute rehab options.  Patient thought about her ability to tolerate 3 hours of therapy over the weekend and is now leaning toward SNF level of post acute rehab.  However, she wishes she could tolerate IP Rehab.  Agreed to follow for tolerance after removal of chest tube.  Please plan for my co-worker Karene Fry to follow up 01/21/17.  Call if questions.   Carmelia Roller., CCC/SLP Admission Coordinator  [place]  Cell [phone]

## 2017-01-19 NOTE — Progress Notes (Signed)
PROGRESS NOTE    Nancy Moore Aro  ELF:[id number] DOB: 22-Nov-1965 DOA: 01/09/2017 PCP: Darreld Mclean, MD     Brief Narrative:  51 year old BF PMHx stage IV Adenocarcinoma lung cancer, HTN,   Presented to the emergency department complaining of severe shortness of breath. She recently had a re-staging CT scan of her chest which showed a large, complex pericardial effusion as well as a large right pleural effusion. An urgent referral to cardiology was done and Dr. Debara Pickett  performed a 2D Echo, which showed massive pericardial effusion with signs of early tamponade. She was sent to ED further evaluation and treatment.  She is admitted pericardial effusion with early tamponade underwent pericardial window with R chest tube placement. During hospital stay was treated for UTI. PT evaluated patient and is recommending CIR       Subjective: 12/24 A/O 4, negative SOB, appropriate amount of CP currently rated 7/10 (right side chest tube). Negative abdominal pain, negative N/V.     Assessment & Plan:   Principal Problem:   Pericardial effusion Active Problems:   Adenocarcinoma of left lung, stage 4 (HCC)   Essential hypertension   Pleural effusion   Hypokalemia   Dyspnea   Leukocytosis   AKI (acute kidney injury) ([place])   Palliative care encounter   Chest tube in place   Acute lower UTI   Pain   Benign essential HTN   Acute blood loss anemia   Cardiac/pericardial tamponade   Malignant pericardial Effusion/Malignant RIGHT Pleural Effusion -12/15 S/P placement pericardial window: Aspirated fluid found to be malignant see pathology below -12/15/P RIGHT chest tube placed.Aspirated fluid found to be malignant see pathology below  Adenocarcinoma LEFT lung stage IV -Patient followed by Dr. Lorna Few as outpatient -Per EMR (12/18 oncology are in navigator documentation) patient to continue Afatinib Oncology recommending to continue GILOTRIF as well  -12/25 consult oncology will  d they see patient while hospitalized?    Acute systolic CHF -Interoperative TEE shows reduced systolic function see results below - Strict in and out -Daily weight  Essential hypertension -Stable -Lasix 40 mg daily  Acute renal failure -Resolved   Positive Klebsiella UTI -Completed course of antibiotics  Anasarca -Diuresis as tolerated   Deconditioning -CIR vs SNF placement        DVT prophylaxis: SCD Code Status: Partial Family Communication: None Disposition Plan: CIR  Vs SNF    Consultants:  Cardio Thoracic surgery: Dr. Prescott Gum PALLIATIVE care   Procedures/Significant Events:  12/14 Echocardiogram:-Massive pericardial effusion likely metastatic -LVEF 60-65% 12/15 intraoperative TEE: LVEF= 45-50% 12/15 pericardial window, RIGHT chest tube placement by CT surgery Dr. Dahlia Byes 12/24 RIGHT chest TALC pleurodesis    I have personally reviewed and interpreted all radiology studies and my findings are as above.  VENTILATOR SETTINGS:    Cultures 12/14 pericardial fluid positive malignant cells present C/W Adenocarcinoma  12/14 RIGHT pleural fluid positive malignant cells present C/W Adenocarcinoma  12/14 MRSA by PCRpositive 12/14negative  HIV 12/15 urine positive for Klebsiella OXYTOCA     Antimicrobials: Anti-infectives (From admission, onward)   Start     Stop   01/13/17 1000  cefdinir (OMNICEF) 125 MG/5ML suspension 250 mg     01/17/17 2115   01/12/17 1000  amoxicillin-clavulanate (AUGMENTIN) 875-125 MG per tablet 1 tablet  Status:  Discontinued     01/13/17 0842   01/11/17 1000  cefUROXime (ZINACEF) 1.5 g in dextrose 5 % 50 mL IVPB     01/11/17 2230   01/10/17 0600  cefUROXime (ZINACEF) 1.5 g in dextrose 5 % 50 mL IVPB     01/10/17 2206   01/10/17 0400  fluconazole (DIFLUCAN) IVPB 100 mg     01/10/17 0800       Devices    LINES / TUBES:  RIGHT subxiphoid pericardial window/placement 28 French Bard drain 12/15>> RIGHT 28  French chest tube 12/15>>    Continuous Infusions:   Objective: Vitals:   01/18/17 1452 01/18/17 1951 01/19/17 0105 01/19/17 0611  BP: 102/66 95/64 92/66  90/60  Pulse: (!) 109 (!) 112 (!) 107 (!) 104  Resp: 13 18 18 15   Temp: 97.6 F (36.4 C) 99.4 F (37.4 C) 98.8 F (37.1 C) 99 F (37.2 C)  TempSrc: Oral Oral Oral Oral  SpO2: 98% 96% 94% 92%  Weight:    231 lb 14.8 oz (105.2 kg)  Height:        Intake/Output Summary (Last 24 hours) at 01/19/2017 0726 Last data filed at 01/19/2017 0636 Gross per 24 hour  Intake 240 ml  Output 1390 ml  Net -1150 ml   Filed Weights   01/17/17 0500 01/18/17 0628 01/19/17 0611  Weight: 231 lb 11.3 oz (105.1 kg) 233 lb 4 oz (105.8 kg) 231 lb 14.8 oz (105.2 kg)    Examination:  General: A/O 4, No acute respiratory distress Neck:  Negative scars, masses, torticollis, lymphadenopathy, JVD, right IJ CVL in place covered and clean negative sign of infection Lungs: left lung fields clear to auscultation. Right lung fields diminished. Negative wheezes or crackles  Cardiovascular: Tachycardic, Regular rhythm without murmur gallop or rub normal S1 and S2 Abdomen: negative abdominal pain, nondistended, positive soft, bowel sounds, no rebound, no ascites, no appreciable mass Extremities: No significant cyanosis, clubbing, or edema bilateral lower extremities Skin: Negative rashes, lesions, ulcers Psychiatric:  Negative depression, negative anxiety, negative fatigue, negative mania  Central nervous system:  Cranial nerves II through XII intact, tongue/uvula midline, all extremities muscle strength 5/5, sensation intact throughout,  negative dysarthria, negative expressive aphasia, negative receptive aphasia.  .     Data Reviewed: Care during the described time interval was provided by me .  I have reviewed this patient's available data, including medical history, events of note, physical examination, and all test results as part of my  evaluation.  CBC: Recent Labs  Lab 01/13/17 0847 01/14/17 0402 01/15/17 0615 01/16/17 0426 01/18/17 0500 01/19/17 0600  WBC 16.4* 12.9* 13.4* 11.3* 9.0 10.7*  NEUTROABS 11.1* 7.9* 8.7* 7.3  --   --   HGB 10.3* 10.6* 10.8* 10.4* 9.7* 9.4*  HCT 32.2* 33.2* 34.1* 32.5* 30.5* 30.0*  MCV 82.4 81.8 82.8 82.9 81.3 82.2  PLT 266 282 302 283 292 157   Basic Metabolic Panel: Recent Labs  Lab 01/14/17 0402 01/15/17 0615 01/16/17 0426 01/17/17 0431 01/18/17 0500 01/19/17 0600  NA 135 133* 134* 134* 134* 137  K 3.2* 3.2* 3.4* 3.4* 3.6 3.7  CL 101 102 102 100* 100* 99*  CO2 26 27 28 29 28 31   GLUCOSE 91 102* 108* 118* 95 97  BUN 11 6 7 6 7 8   CREATININE 0.89 0.79 0.81 0.76 0.74 0.83  CALCIUM 8.2* 8.0* 8.3* 8.0* 8.1* 8.4*  MG 1.5* 1.5* 1.8 1.7 1.7  --    GFR: Estimated Creatinine Clearance: 99.5 mL/min (by C-G formula based on SCr of 0.83 mg/dL). Liver Function Tests: No results for input(s): AST, ALT, ALKPHOS, BILITOT, PROT, ALBUMIN in the last 168 hours. No results for input(s): LIPASE, AMYLASE in  the last 168 hours. No results for input(s): AMMONIA in the last 168 hours. Coagulation Profile: No results for input(s): INR, PROTIME in the last 168 hours. Cardiac Enzymes: No results for input(s): CKTOTAL, CKMB, CKMBINDEX, TROPONINI in the last 168 hours. BNP (last 3 results) No results for input(s): PROBNP in the last 8760 hours. HbA1C: No results for input(s): HGBA1C in the last 72 hours. CBG: No results for input(s): GLUCAP in the last 168 hours. Lipid Profile: No results for input(s): CHOL, HDL, LDLCALC, TRIG, CHOLHDL, LDLDIRECT in the last 72 hours. Thyroid Function Tests: No results for input(s): TSH, T4TOTAL, FREET4, T3FREE, THYROIDAB in the last 72 hours. Anemia Panel: No results for input(s): VITAMINB12, FOLATE, FERRITIN, TIBC, IRON, RETICCTPCT in the last 72 hours. Sepsis Labs: No results for input(s): PROCALCITON, LATICACIDVEN in the last 168 hours.  Recent  Results (from the past 240 hour(s))  Culture, blood (Routine X 2) w Reflex to ID Panel     Status: None   Collection Time: 01/09/17 10:35 PM  Result Value Ref Range Status   Specimen Description BLOOD RIGHT HAND  Final   Special Requests IN PEDIATRIC BOTTLE Blood Culture adequate volume  Final   Culture NO GROWTH 5 DAYS  Final   Report Status 01/15/2017 FINAL  Final  Culture, blood (Routine X 2) w Reflex to ID Panel     Status: None   Collection Time: 01/09/17 10:50 PM  Result Value Ref Range Status   Specimen Description BLOOD LEFT HAND  Final   Special Requests IN PEDIATRIC BOTTLE Blood Culture adequate volume  Final   Culture NO GROWTH 5 DAYS  Final   Report Status 01/15/2017 FINAL  Final  Surgical pcr screen     Status: Abnormal   Collection Time: 01/09/17 11:10 PM  Result Value Ref Range Status   MRSA, PCR NEGATIVE NEGATIVE Final   Staphylococcus aureus POSITIVE (A) NEGATIVE Final    Comment: (NOTE) The Xpert SA Assay (FDA approved for NASAL specimens in patients 41 years of age and older), is one component of a comprehensive surveillance program. It is not intended to diagnose infection nor to guide or monitor treatment.   Urine Culture     Status: Abnormal   Collection Time: 01/10/17  6:25 AM  Result Value Ref Range Status   Specimen Description URINE, RANDOM  Final   Special Requests NONE  Final   Culture >=100,000 COLONIES/mL KLEBSIELLA OXYTOCA (A)  Final   Report Status 01/13/2017 FINAL  Final   Organism ID, Bacteria KLEBSIELLA OXYTOCA (A)  Final      Susceptibility   Klebsiella oxytoca - MIC*    AMPICILLIN >=32 RESISTANT Resistant     CEFAZOLIN <=4 SENSITIVE Sensitive     CEFTRIAXONE <=1 SENSITIVE Sensitive     CIPROFLOXACIN <=0.25 SENSITIVE Sensitive     GENTAMICIN <=1 SENSITIVE Sensitive     IMIPENEM <=0.25 SENSITIVE Sensitive     NITROFURANTOIN <=16 SENSITIVE Sensitive     TRIMETH/SULFA <=20 SENSITIVE Sensitive     AMPICILLIN/SULBACTAM 8 SENSITIVE Sensitive      PIP/TAZO <=4 SENSITIVE Sensitive     Extended ESBL NEGATIVE Sensitive     * >=100,000 COLONIES/mL KLEBSIELLA OXYTOCA         Radiology Studies: No results found.      Scheduled Meds: . afatinib dimaleate  40 mg Oral Daily  . bisacodyl  10 mg Oral Daily  . clindamycin   Topical BID  . feeding supplement  1 Container Oral TID BM  .  furosemide  40 mg Oral Daily  . mupirocin ointment   Nasal BID  . pantoprazole  40 mg Oral Q1200  . senna-docusate  1 tablet Oral QHS   Continuous Infusions:   LOS: 10 days    Time spent:40 min    Nykia Turko, Geraldo Docker, MD [facility] Pager [phone]  If 7PM-7AM, please contact night-coverage [email or web address] Password [facility] 01/19/2017, 7:26 AM

## 2017-01-19 NOTE — Care Management Note (Signed)
Case Management Note  Patient Details  Name: Nancy Moore MRN: [id number] Date of Birth: [birth year]  Subjective/Objective:    Pt admitted with malignant pleural effusions  - pt has stage IV Lung CA              Action/Plan:   PTA independent from home.  CM requested Palliative Consult for Goals of Care - including recommendations for community programs offering palliative and or hospice.  Pt will need PT eval once medically stable from cardiothoracic standpoint.     Expected Discharge Date:  01/15/17               Expected Discharge Plan:  Home w Hospice Care  In-House Referral:  Clinical Social Work  Discharge planning Services  CM Consult  Post Acute Care Choice:    Choice offered to:     DME Arranged:    DME Agency:     HH Arranged:    HH Agency:     Status of Service:     If discussed at [name], dates discussed:    Additional Comments: 01/19/2017  Pt still has CT.  CSW working on placement  01/14/17 CIR recommended - CM requested CIR consult from attending - CSW consulted for back up plan with SNF Maryclare Labrador, RN 01/19/2017, 11:44 AM

## 2017-01-20 ENCOUNTER — Inpatient Hospital Stay (HOSPITAL_COMMUNITY): Payer: 59

## 2017-01-20 DIAGNOSIS — R601 Generalized edema: Secondary | ICD-10-CM

## 2017-01-20 LAB — BASIC METABOLIC PANEL
Anion gap: 9 (ref 5–15)
BUN: 10 mg/dL (ref 6–20)
CO2: 29 mmol/L (ref 22–32)
Calcium: 8.3 mg/dL — ABNORMAL LOW (ref 8.9–10.3)
Chloride: 96 mmol/L — ABNORMAL LOW (ref 101–111)
Creatinine, Ser: 0.83 mg/dL (ref 0.44–1.00)
GFR calc Af Amer: >60 mL/min (ref >60)
GFR calc non Af Amer: >60 mL/min (ref >60)
Glucose, Bld: 109 mg/dL — ABNORMAL HIGH (ref 65–99)
Potassium: 3.5 mmol/L (ref 3.5–5.1)
Sodium: 134 mmol/L — ABNORMAL LOW (ref 135–145)

## 2017-01-20 LAB — CBC
HCT: 30 % — ABNORMAL LOW (ref 36.0–46.0)
Hemoglobin: 9.5 g/dL — ABNORMAL LOW (ref 12.0–15.0)
MCH: 25.8 pg — ABNORMAL LOW (ref 26.0–34.0)
MCHC: 31.7 g/dL (ref 30.0–36.0)
MCV: 81.5 fL (ref 78.0–100.0)
Platelets: 317 10*3/uL (ref 150–400)
RBC: 3.68 MIL/uL — ABNORMAL LOW (ref 3.87–5.11)
RDW: 17.1 % — ABNORMAL HIGH (ref 11.5–15.5)
WBC: 14 10*3/uL — ABNORMAL HIGH (ref 4.0–10.5)

## 2017-01-20 LAB — MAGNESIUM: Magnesium: 1.4 mg/dL — ABNORMAL LOW (ref 1.7–2.4)

## 2017-01-20 MED ORDER — POTASSIUM CHLORIDE CRYS ER 20 MEQ PO TBCR
30.0000 meq | EXTENDED_RELEASE_TABLET | Freq: Once | ORAL | Status: AC
  Administered 2017-01-20: 30 meq via ORAL
  Filled 2017-01-20: qty 1

## 2017-01-20 MED ORDER — MAGNESIUM SULFATE 50 % IJ SOLN
3.0000 g | Freq: Once | INTRAVENOUS | Status: AC
  Administered 2017-01-20: 3 g via INTRAVENOUS
  Filled 2017-01-20: qty 6

## 2017-01-20 MED ORDER — AFATINIB DIMALEATE 40 MG PO TABS
40.0000 mg | ORAL_TABLET | Freq: Every day | ORAL | Status: DC
  Administered 2017-01-20 – 2017-01-22 (×3): 40 mg via ORAL
  Filled 2017-01-20 (×3): qty 1

## 2017-01-20 NOTE — Progress Notes (Addendum)
      [address]       [place] [address]             [phone]      10 Days Post-Op Procedure(s) (LRB): PERICARDIAL WINDOW- SUB XYPHOID, RIGHT CHEST TUBE (N/A) Subjective: Feels okay this morning.  Objective: Vital signs in last 24 hours: Temp:  [98.2 F (36.8 C)-100 F (37.8 C)] 98.5 F (36.9 C) (12/25 0726) Pulse Rate:  [102-113] 102 (12/25 0726) Cardiac Rhythm: Sinus tachycardia (12/25 0726) Resp:  [14-16] 14 (12/25 0726) BP: (95-108)/(61-78) 97/65 (12/25 0726) SpO2:  [92 %-99 %] 92 % (12/25 0726) Weight:  [232 lb 12.9 oz (105.6 kg)] 232 lb 12.9 oz (105.6 kg) (12/25 0518)     Intake/Output from previous day: 12/24 0701 - 12/25 0700 In: 660 [P.O.:660] Out: 600 [Urine:550; Chest Tube:50] Intake/Output this shift: No intake/output data recorded.  General appearance: alert, cooperative and no distress Heart: regular rate and rhythm, S1, S2 normal, no murmur, click, rub or gallop Lungs: clear to auscultation bilaterally Abdomen: soft, non-tender; bowel sounds normal; no masses,  no organomegaly Extremities: 2+ pitting pedal edema Wound: clean and dry  Lab Results: Recent Labs    01/19/17 0600 01/20/17 0703  WBC 10.7* 14.0*  HGB 9.4* 9.5*  HCT 30.0* 30.0*  PLT 312 317   BMET:  Recent Labs    01/19/17 0600 01/20/17 0703  NA 137 134*  K 3.7 3.5  CL 99* 96*  CO2 31 29  GLUCOSE 97 109*  BUN 8 10  CREATININE 0.83 0.83  CALCIUM 8.4* 8.3*    PT/INR: No results for input(s): LABPROT, INR in the last 72 hours. ABG    Component Value Date/Time   PHART 7.399 01/11/2017 0422   HCO3 27.3 01/11/2017 0422   TCO2 24 01/10/2017 0234   O2SAT 97.0 01/11/2017 0422   CBG (last 3)  No results for input(s): GLUCAP in the last 72 hours.  Assessment/Plan: S/P Procedure(s) (LRB): PERICARDIAL WINDOW- SUB XYPHOID, RIGHT CHEST TUBE (N/A)  1. CV - Slightly tachycardic. BP low normal.  2.  Pulmonary - On room air. TALC slurry yesterday. CXR this  morning showed: IMPRESSION: Stable cardiomegaly with slight interval worsening hazy perihilar opacification likely interstitial edema. Stable moderate size left pleural effusion and slight worsening small right pleural effusion. Interval removal right-sided chest tube with slight worsening of small right-sided pneumothorax. 3. Anemia-H and H 9.5 and 30.0 this am. 4. Supplement potassium 5. Working on SNF vs. CIR placement.     LOS: 11 days    Elgie Collard 01/20/2017 Chest x-ray satisfactory Patient ready for transfer to rehabilitation facility Returned office with chest x-ray in 2-3 weeks patient examined and medical record reviewed,agree with above note. Tharon Aquas Trigt III 01/20/2017

## 2017-01-20 NOTE — Progress Notes (Addendum)
PROGRESS NOTE    Nancy Moore  GNO:[id number] DOB: 13-Aug-1965 DOA: 01/09/2017 PCP: Darreld Mclean, MD     Brief Narrative:  51 year old BF PMHx stage IV Adenocarcinoma lung cancer, HTN,   Presented to the emergency department complaining of severe shortness of breath. She recently had a re-staging CT scan of her chest which showed a large, complex pericardial effusion as well as a large right pleural effusion. An urgent referral to cardiology was done and Dr. Debara Pickett  performed a 2D Echo, which showed massive pericardial effusion with signs of early tamponade. She was sent to ED further evaluation and treatment.  She is admitted pericardial effusion with early tamponade underwent pericardial window with R chest tube placement. During hospital stay was treated for UTI. PT evaluated patient and is recommending CIR       Subjective: 12/25 A/O 4, negative SOB, negative CP states currently chest pain control. Negative abdominal pain. Negative N/V.     Assessment & Plan:   Principal Problem:   Pericardial effusion Active Problems:   Adenocarcinoma of left lung, stage 4 (HCC)   Essential hypertension   Pleural effusion   Hypokalemia   Dyspnea   Leukocytosis   AKI (acute kidney injury) ([place])   Palliative care encounter   Chest tube in place   Acute lower UTI   Pain   Benign essential HTN   Acute blood loss anemia   Cardiac/pericardial tamponade   Malignant pericardial Effusion/Malignant RIGHT Pleural Effusion -12/15 S/P placement pericardial window: Aspirated fluid found to be malignant see pathology below -12/15/P RIGHT chest tube placed.Aspirated fluid found to be malignant see pathology below -Discussed case w/ Dr Julien Nordmann, [place], who stated he expected this findings in the pleural fluid and pericardial fluid. Continue current oncology treatment. Have patient follow-up as outpatient once discharged in 1-2 weeks   Adenocarcinoma LEFT lung stage IV -Patient  followed by Dr. Lorna Few as outpatient -Per EMR (12/18 oncology are in navigator documentation) patient to continue Afatinib Oncology recommending to continue GILOTRIF as well  -Patient had her medication Gilotrif brought in from home and is being administered on schedule.  Acute systolic CHF -Interoperative TEE shows reduced systolic function see results below - Strict in and out since admission +6.3 L -Since admission patient fluid positive however patient's BP soft will only be able to gently diuresis. -Daily weight Filed Weights   01/18/17 0628 01/19/17 0611 01/20/17 0518  Weight: 233 lb 4 oz (105.8 kg) 231 lb 14.8 oz (105.2 kg) 232 lb 12.9 oz (105.6 kg)    Essential hypertension -Stable -Lasix 40 mg daily  Acute renal failure -Resolved   Positive Klebsiella UTI -Completed course of antibiotics  Anasarca -Diuresis tolerated  Deconditioning -CIR will reevaluate patient after chest tube has removed.   Hypokalemia -Potassium goal> 4 -K-Dur 60 mEq  Hypomagnesemia -Magnesium goal> 2 -Magnesium IV 3 g       DVT prophylaxis: SCD Code Status: Partial Family Communication: None Disposition Plan: CIR  Vs SNF    Consultants:  Cardio Thoracic surgery: Dr. Prescott Gum PALLIATIVE care Oncology phone consult Dr. Julien Nordmann    Procedures/Significant Events:  12/14 Echocardiogram:-Massive pericardial effusion likely metastatic -LVEF 60-65% 12/15 intraoperative TEE: LVEF= 45-50% 12/15 pericardial window, RIGHT chest tube placement by CT surgery Dr. Dahlia Byes 12/24 RIGHT chest TALC pleurodesis    I have personally reviewed and interpreted all radiology studies and my findings are as above.  VENTILATOR SETTINGS:    Cultures 12/14 pericardial fluid positive malignant cells  present C/W Adenocarcinoma  12/14 RIGHT pleural fluid positive malignant cells present C/W Adenocarcinoma  12/14 MRSA by PCRpositive 12/14negative  HIV 12/15 urine positive for  Klebsiella OXYTOCA     Antimicrobials: Anti-infectives (From admission, onward)   Start     Stop   01/13/17 1000  cefdinir (OMNICEF) 125 MG/5ML suspension 250 mg     01/17/17 2115   01/12/17 1000  amoxicillin-clavulanate (AUGMENTIN) 875-125 MG per tablet 1 tablet  Status:  Discontinued     01/13/17 0842   01/11/17 1000  cefUROXime (ZINACEF) 1.5 g in dextrose 5 % 50 mL IVPB     01/11/17 2230   01/10/17 0600  cefUROXime (ZINACEF) 1.5 g in dextrose 5 % 50 mL IVPB     01/10/17 2206   01/10/17 0400  fluconazole (DIFLUCAN) IVPB 100 mg     01/10/17 0800       Devices    LINES / TUBES:  RIGHT subxiphoid pericardial window/placement 28 French Bard drain 12/15>> RIGHT 28 French chest tube 12/15>> 12/24    Continuous Infusions:   Objective: Vitals:   01/20/17 0026 01/20/17 0423 01/20/17 0518 01/20/17 0726  BP: 95/65 97/61  97/65  Pulse:  (!) 113  (!) 102  Resp: 16 16  14   Temp: 99.8 F (37.7 C) 100 F (37.8 C)  98.5 F (36.9 C)  TempSrc: Oral Oral  Oral  SpO2: 94% 92%  92%  Weight:   232 lb 12.9 oz (105.6 kg)   Height:        Intake/Output Summary (Last 24 hours) at 01/20/2017 0951 Last data filed at 01/20/2017 0400 Gross per 24 hour  Intake 660 ml  Output 300 ml  Net 360 ml   Filed Weights   01/18/17 0628 01/19/17 0611 01/20/17 0518  Weight: 233 lb 4 oz (105.8 kg) 231 lb 14.8 oz (105.2 kg) 232 lb 12.9 oz (105.6 kg)    Physical Exam:  General: A/O 4, No acute respiratory distress Neck:  Negative scars, masses, torticollis, lymphadenopathy, JVD Lungs: left lung fields clear to auscultation. Right upper lung field clear to auscultation, RML/RLL diminished/absent breath sounds. Negative wheezes, negative crackles.  Cardiovascular: Tachycardic, Regular rhythm without murmur gallop or rub normal S1 and S2 Abdomen: Obese, negative abdominal pain, nondistended, positive soft, bowel sounds, no rebound, no ascites, no appreciable mass Extremities: No significant  cyanosis, clubbing, or edema bilateral lower extremities Skin: Negative rashes, lesions, ulcers Psychiatric:  Negative depression, negative anxiety, negative fatigue, negative mania  Central nervous system:  Cranial nerves II through XII intact, tongue/uvula midline, all extremities muscle strength 5/5, sensation intact throughout, negative dysarthria, negative expressive aphasia, negative receptive aphasia..     Data Reviewed: Care during the described time interval was provided by me .  I have reviewed this patient's available data, including medical history, events of note, physical examination, and all test results as part of my evaluation.  CBC: Recent Labs  Lab 01/14/17 0402 01/15/17 0615 01/16/17 0426 01/18/17 0500 01/19/17 0600 01/20/17 0703  WBC 12.9* 13.4* 11.3* 9.0 10.7* 14.0*  NEUTROABS 7.9* 8.7* 7.3  --   --   --   HGB 10.6* 10.8* 10.4* 9.7* 9.4* 9.5*  HCT 33.2* 34.1* 32.5* 30.5* 30.0* 30.0*  MCV 81.8 82.8 82.9 81.3 82.2 81.5  PLT 282 302 283 292 312 846   Basic Metabolic Panel: Recent Labs  Lab 01/15/17 0615 01/16/17 0426 01/17/17 0431 01/18/17 0500 01/19/17 0600 01/20/17 0703  NA 133* 134* 134* 134* 137 134*  K 3.2*  3.4* 3.4* 3.6 3.7 3.5  CL 102 102 100* 100* 99* 96*  CO2 27 28 29 28 31 29   GLUCOSE 102* 108* 118* 95 97 109*  BUN 6 7 6 7 8 10   CREATININE 0.79 0.81 0.76 0.74 0.83 0.83  CALCIUM 8.0* 8.3* 8.0* 8.1* 8.4* 8.3*  MG 1.5* 1.8 1.7 1.7  --  1.4*   GFR: Estimated Creatinine Clearance: 99.6 mL/min (by C-G formula based on SCr of 0.83 mg/dL). Liver Function Tests: No results for input(s): AST, ALT, ALKPHOS, BILITOT, PROT, ALBUMIN in the last 168 hours. No results for input(s): LIPASE, AMYLASE in the last 168 hours. No results for input(s): AMMONIA in the last 168 hours. Coagulation Profile: No results for input(s): INR, PROTIME in the last 168 hours. Cardiac Enzymes: No results for input(s): CKTOTAL, CKMB, CKMBINDEX, TROPONINI in the last 168  hours. BNP (last 3 results) No results for input(s): PROBNP in the last 8760 hours. HbA1C: No results for input(s): HGBA1C in the last 72 hours. CBG: No results for input(s): GLUCAP in the last 168 hours. Lipid Profile: No results for input(s): CHOL, HDL, LDLCALC, TRIG, CHOLHDL, LDLDIRECT in the last 72 hours. Thyroid Function Tests: No results for input(s): TSH, T4TOTAL, FREET4, T3FREE, THYROIDAB in the last 72 hours. Anemia Panel: No results for input(s): VITAMINB12, FOLATE, FERRITIN, TIBC, IRON, RETICCTPCT in the last 72 hours. Sepsis Labs: No results for input(s): PROCALCITON, LATICACIDVEN in the last 168 hours.  No results found for this or any previous visit (from the past 240 hour(s)).       Radiology Studies: Dg Chest 2 View  Result Date: 01/20/2017 CLINICAL DATA:  Pleural effusion right-sided. EXAM: CHEST  2 VIEW COMPARISON:  01/17/2017 FINDINGS: Right IJ central venous catheter unchanged with tip over the SVC. Interval removal of right-sided chest tube with slight worsening of small right-sided pneumothorax. Lungs are hypoinflated with persistent moderate size left effusion likely dysphasia atelectasis. Interval worsening of hazy prominence of the perihilar markings suggesting worsening interstitial edema. Small right effusion slightly worse. Stable cardiomegaly. Remainder of the exam is unchanged. IMPRESSION: Stable cardiomegaly with slight interval worsening hazy perihilar opacification likely interstitial edema. Stable moderate size left pleural effusion and slight worsening small right pleural effusion. Interval removal right-sided chest tube with slight worsening of small right-sided pneumothorax. These results will be called to the ordering clinician or representative by the Radiologist Assistant, and communication documented in the PACS or zVision Dashboard. Electronically Signed   By: Marin Olp M.D.   On: 01/20/2017 07:34        Scheduled Meds: . afatinib  dimaleate  40 mg Oral Daily  . bisacodyl  10 mg Oral Daily  . clindamycin   Topical BID  . feeding supplement  1 Container Oral TID BM  . furosemide  40 mg Oral Daily  . mupirocin ointment   Nasal BID  . pantoprazole  40 mg Oral Q1200  . senna-docusate  1 tablet Oral QHS   Continuous Infusions:   LOS: 11 days    Time spent:40 min    Praise Dolecki, Geraldo Docker, MD [facility] Pager [phone]  If 7PM-7AM, please contact night-coverage [email or web address] Password [email or web address] 01/20/2017, 9:51 AM

## 2017-01-21 ENCOUNTER — Encounter: Payer: Self-pay | Admitting: *Deleted

## 2017-01-21 ENCOUNTER — Telehealth: Payer: Self-pay | Admitting: Internal Medicine

## 2017-01-21 LAB — BASIC METABOLIC PANEL
Anion gap: 6 (ref 5–15)
BUN: 9 mg/dL (ref 6–20)
CO2: 30 mmol/L (ref 22–32)
Calcium: 8.2 mg/dL — ABNORMAL LOW (ref 8.9–10.3)
Chloride: 99 mmol/L — ABNORMAL LOW (ref 101–111)
Creatinine, Ser: 0.87 mg/dL (ref 0.44–1.00)
GFR calc Af Amer: >60 mL/min (ref >60)
GFR calc non Af Amer: >60 mL/min (ref >60)
Glucose, Bld: 121 mg/dL — ABNORMAL HIGH (ref 65–99)
Potassium: 3.7 mmol/L (ref 3.5–5.1)
Sodium: 135 mmol/L (ref 135–145)

## 2017-01-21 LAB — CBC
HCT: 28.2 % — ABNORMAL LOW (ref 36.0–46.0)
Hemoglobin: 9 g/dL — ABNORMAL LOW (ref 12.0–15.0)
MCH: 26 pg (ref 26.0–34.0)
MCHC: 31.9 g/dL (ref 30.0–36.0)
MCV: 81.5 fL (ref 78.0–100.0)
Platelets: 311 10*3/uL (ref 150–400)
RBC: 3.46 MIL/uL — ABNORMAL LOW (ref 3.87–5.11)
RDW: 17.2 % — ABNORMAL HIGH (ref 11.5–15.5)
WBC: 11.9 10*3/uL — ABNORMAL HIGH (ref 4.0–10.5)

## 2017-01-21 LAB — MAGNESIUM: Magnesium: 1.8 mg/dL (ref 1.7–2.4)

## 2017-01-21 MED ORDER — MAGNESIUM OXIDE 400 (241.3 MG) MG PO TABS
400.0000 mg | ORAL_TABLET | Freq: Every day | ORAL | Status: DC
  Administered 2017-01-21 – 2017-01-22 (×2): 400 mg via ORAL
  Filled 2017-01-21 (×2): qty 1

## 2017-01-21 MED ORDER — POTASSIUM CHLORIDE CRYS ER 20 MEQ PO TBCR
20.0000 meq | EXTENDED_RELEASE_TABLET | Freq: Two times a day (BID) | ORAL | Status: DC
  Administered 2017-01-21 – 2017-01-22 (×3): 20 meq via ORAL
  Filled 2017-01-21 (×3): qty 1

## 2017-01-21 NOTE — Progress Notes (Signed)
Progress Note    Nancy Moore  DGU:[id number] DOB: 07/15/1965  DOA: 01/09/2017 PCP: Nancy Mclean, MD    Brief Narrative:   Chief complaint: F/U pericardial/pleural effusions  Medical records reviewed and are as summarized below:  Nancy Moore is an 51 y.o. female with a PMH of stage IV lung adenocarcinoma complicated by massive pericardial and pleural effusions who was admitted 01/09/17 with severe shortness of breath. Treated with right chest tube and a pericardial window.  Assessment/Plan:   Principal Problem:   Shortness of breath secondary to massive malignant pericardial effusion/early tamponade as well as malignant right pleural effusion in the setting of stage IV adenocarcinoma of the lung Status post pericardial window 01/10/17 with cytology positive for malignant cells. Status post right chest tube placement on the same day, also with positive cytology. Underwent talc pleurodesis 01/19/17. Chest tube discontinued 01/20/17. Follow-up with oncologist 1-2 weeks post discharge. Continue Afatinib and GILOTRIF,  Active Problems:   Acute on chronic systolic CHF  TEE done 95/63/87 and showed an EF of 45-50 percent with a large circumferential pericardial effusion with tamponade. Continue to diurese as blood pressure tolerates. Currently being managed with Lasix 40 mg daily.    Essential hypertension Blood pressures soft. On Lasix. Hydralazine ordered as needed.    Hypokalemia/hypomagnesemia Secondary to diuretics. We'll place on routine supplementation.    AKI (acute kidney injury) ([place]) Resolved.    Palliative care encounter Evaluated by palliative care team 01/13/17. She is currently a partial code with no chest compressions. Continues to receive aggressive care.    Acute lower UTI Klebsiella UTI treated, off antibiotics now.    Obesity Body mass index is 37.61 kg/m.   Family Communication/Anticipated D/C date and plan/Code Status   DVT prophylaxis:  Lovenox ordered. Code Status: Partial code: No chest compressions.  Family Communication: No family at the bedside. Disposition Plan: SNF placement pending.   Medical Consultants:    CVTS  Palliative Care  Telephone consultation with oncology   Anti-Infectives:    Diflucan 01/10/17---> 01/10/17  Cefuroxime 01/10/17---> 01/11/17  Augmentin 01/12/17---> 01/13/17  Omnicef 01/13/17---> 01/17/17   Subjective:   Patient reports that her pain is controlled on her current pain management regimen. She continues to have some shortness of breath, but has been able to ambulate in the halls. She is disappointed that she is not a candidate for CIR but knows that she would not be able to tolerate 3 hours of therapy. Says the chemotherapy she is taking orally makes her sleepy. No nausea or vomiting. Bowels are moving. Appetite is fair.  Objective:    Vitals:   01/20/17 2338 01/21/17 0000 01/21/17 0251 01/21/17 0400  BP: 101/61  (!) 95/59   Pulse: (!) 106 (!) 106 (!) 105 (!) 101  Resp: 14 14 15 11   Temp: 99 F (37.2 C)  99 F (37.2 C)   TempSrc: Oral  Oral   SpO2: 91% 91% 91% 91%  Weight:   105.7 kg (233 lb 0.4 oz)   Height:        Intake/Output Summary (Last 24 hours) at 01/21/2017 0737 Last data filed at 01/21/2017 0400 Gross per 24 hour  Intake 760 ml  Output 600 ml  Net 160 ml   Filed Weights   01/19/17 0611 01/20/17 0518 01/21/17 0251  Weight: 105.2 kg (231 lb 14.8 oz) 105.6 kg (232 lb 12.9 oz) 105.7 kg (233 lb 0.4 oz)    Exam: General: No acute distress. Pale  and chronically ill appearing. Cardiovascular: Heart sounds show a regular rate, and rhythm. No gallops or rubs. No murmurs. No JVD. Lungs: Diminished in the bases bilaterally. No rales, rhonchi or wheezes. Abdomen: Soft, nontender, nondistended with normal active bowel sounds. No masses. No hepatosplenomegaly. Neurological: Alert and oriented 3. Moves all extremities 4 with equal strength. Cranial nerves  II through XII grossly intact. Skin: Pale. Warm and dry. No rashes or lesions. Extremities: Massive lower extremity edema. Psychiatric: Mood and affect are depressed. Insight and judgment are normal.   Data Reviewed:   I have personally reviewed following labs and imaging studies:  Labs: Labs show the following:   Basic Metabolic Panel: Recent Labs  Lab 01/16/17 0426 01/17/17 0431 01/18/17 0500 01/19/17 0600 01/20/17 0703 01/21/17 0317  NA 134* 134* 134* 137 134* 135  K 3.4* 3.4* 3.6 3.7 3.5 3.7  CL 102 100* 100* 99* 96* 99*  CO2 28 29 28 31 29 30   GLUCOSE 108* 118* 95 97 109* 121*  BUN 7 6 7 8 10 9   CREATININE 0.81 0.76 0.74 0.83 0.83 0.87  CALCIUM 8.3* 8.0* 8.1* 8.4* 8.3* 8.2*  MG 1.8 1.7 1.7  --  1.4* 1.8   GFR Estimated Creatinine Clearance: 95.1 mL/min (by C-G formula based on SCr of 0.87 mg/dL).  CBC: Recent Labs  Lab 01/15/17 0615 01/16/17 0426 01/18/17 0500 01/19/17 0600 01/20/17 0703 01/21/17 0317  WBC 13.4* 11.3* 9.0 10.7* 14.0* 11.9*  NEUTROABS 8.7* 7.3  --   --   --   --   HGB 10.8* 10.4* 9.7* 9.4* 9.5* 9.0*  HCT 34.1* 32.5* 30.5* 30.0* 30.0* 28.2*  MCV 82.8 82.9 81.3 82.2 81.5 81.5  PLT 302 283 292 312 317 311    Microbiology No results found for this or any previous visit (from the past 240 hour(s)).  Procedures and diagnostic studies:  Dg Chest 2 View  Result Date: 01/20/2017 CLINICAL DATA:  Pleural effusion right-sided. EXAM: CHEST  2 VIEW COMPARISON:  01/17/2017 FINDINGS: Right IJ central venous catheter unchanged with tip over the SVC. Interval removal of right-sided chest tube with slight worsening of small right-sided pneumothorax. Lungs are hypoinflated with persistent moderate size left effusion likely dysphasia atelectasis. Interval worsening of hazy prominence of the perihilar markings suggesting worsening interstitial edema. Small right effusion slightly worse. Stable cardiomegaly. Remainder of the exam is unchanged. IMPRESSION:  Stable cardiomegaly with slight interval worsening hazy perihilar opacification likely interstitial edema. Stable moderate size left pleural effusion and slight worsening small right pleural effusion. Interval removal right-sided chest tube with slight worsening of small right-sided pneumothorax. These results will be called to the ordering clinician or representative by the Radiologist Assistant, and communication documented in the PACS or zVision Dashboard. Electronically Signed   By: Marin Olp M.D.   On: 01/20/2017 07:34    Medications:   . afatinib dimaleate  40 mg Oral Daily  . bisacodyl  10 mg Oral Daily  . clindamycin   Topical BID  . feeding supplement  1 Container Oral TID BM  . furosemide  40 mg Oral Daily  . mupirocin ointment   Nasal BID  . pantoprazole  40 mg Oral Q1200  . senna-docusate  1 tablet Oral QHS   Continuous Infusions:   LOS: 12 days   Jacquelynn Cree  [facility] Pager [phone]. If unable to reach me by pager, please call my cell phone at [phone].  *Please refer to [email or web address], password [email or web address] to get updated schedule on  who will round on this patient, as hospitalists switch teams weekly. If 7PM-7AM, please contact night-coverage at [email or web address], password [email or web address] for any overnight needs.  01/21/2017, 7:37 AM

## 2017-01-21 NOTE — Progress Notes (Signed)
[place]       [place] [address]             [phone]      11 Days Post-Op Procedure(s) (LRB): PERICARDIAL WINDOW- SUB XYPHOID, RIGHT CHEST TUBE (N/A) Subjective: Feels pretty well  Objective: Vital signs in last 24 hours: Temp:  [98.4 F (36.9 C)-99 F (37.2 C)] 99 F (37.2 C) (12/26 0251) Pulse Rate:  [101-116] 101 (12/26 0400) Cardiac Rhythm: Normal sinus rhythm (12/26 0000) Resp:  [11-17] 11 (12/26 0400) BP: (95-117)/(59-69) 95/59 (12/26 0251) SpO2:  [89 %-98 %] 91 % (12/26 0400) Weight:  [233 lb 0.4 oz (105.7 kg)] 233 lb 0.4 oz (105.7 kg) (12/26 0251)  Hemodynamic parameters for last 24 hours:    Intake/Output from previous day: 12/25 0701 - 12/26 0700 In: 760 [P.O.:660; IV Piggyback:100] Out: 600 [Urine:600] Intake/Output this shift: No intake/output data recorded.  General appearance: alert, cooperative and no distress Heart: regular rate and rhythm Lungs: fair air exchange Extremities: + pitting edema Wound: incis healing well  Lab Results: Recent Labs    01/20/17 0703 01/21/17 0317  WBC 14.0* 11.9*  HGB 9.5* 9.0*  HCT 30.0* 28.2*  PLT 317 311   BMET:  Recent Labs    01/20/17 0703 01/21/17 0317  NA 134* 135  K 3.5 3.7  CL 96* 99*  CO2 29 30  GLUCOSE 109* 121*  BUN 10 9  CREATININE 0.83 0.87  CALCIUM 8.3* 8.2*    PT/INR: No results for input(s): LABPROT, INR in the last 72 hours. ABG    Component Value Date/Time   PHART 7.399 01/11/2017 0422   HCO3 27.3 01/11/2017 0422   TCO2 24 01/10/2017 0234   O2SAT 97.0 01/11/2017 0422   CBG (last 3)  No results for input(s): GLUCAP in the last 72 hours.  Meds Scheduled Meds: . afatinib dimaleate  40 mg Oral Daily  . bisacodyl  10 mg Oral Daily  . clindamycin   Topical BID  . feeding supplement  1 Container Oral TID BM  . furosemide  40 mg Oral Daily  . mupirocin ointment   Nasal BID  . pantoprazole  40 mg Oral Q1200  . senna-docusate  1 tablet Oral QHS    Continuous Infusions: PRN Meds:.acetaminophen, albuterol, ammonium lactate, dextromethorphan-guaiFENesin, hydrALAZINE, loperamide, oxyCODONE, zolpidem  Xrays Dg Chest 2 View  Result Date: 01/20/2017 CLINICAL DATA:  Pleural effusion right-sided. EXAM: CHEST  2 VIEW COMPARISON:  01/17/2017 FINDINGS: Right IJ central venous catheter unchanged with tip over the SVC. Interval removal of right-sided chest tube with slight worsening of small right-sided pneumothorax. Lungs are hypoinflated with persistent moderate size left effusion likely dysphasia atelectasis. Interval worsening of hazy prominence of the perihilar markings suggesting worsening interstitial edema. Small right effusion slightly worse. Stable cardiomegaly. Remainder of the exam is unchanged. IMPRESSION: Stable cardiomegaly with slight interval worsening hazy perihilar opacification likely interstitial edema. Stable moderate size left pleural effusion and slight worsening small right pleural effusion. Interval removal right-sided chest tube with slight worsening of small right-sided pneumothorax. These results will be called to the ordering clinician or representative by the Radiologist Assistant, and communication documented in the PACS or zVision Dashboard. Electronically Signed   By: Marin Olp M.D.   On: 01/20/2017 07:34    Assessment/Plan:  1 conts to be stable with tubes out, tachy at times 2 no new CXR today 3 leukocytosis improved, H/H lower  4 placement pending 5 will arrange appts  for f/u with MD and chest tube suture removal  LOS: 12 days    Nancy Moore 01/21/2017

## 2017-01-21 NOTE — Progress Notes (Signed)
Rehab admissions - I have received a denial from [facility] for acute inpatient rehab admission.  I will have my partner follow up tomorrow.  Call me for questions.  #[phone]

## 2017-01-21 NOTE — Progress Notes (Addendum)
Physical Therapy Treatment Patient Details Name: Nancy Moore MRN: [id number] DOB: [birth year] Today's Date: 01/21/2017    History of Present Illness Pt is a 51 y.o. female with PMH significant for stage IVlung adenocarcinoma and HTN, who presented to ED on 01/09/17 with worsening SOB. Pt recently had a re-staging chest CT which showed a large, complex pericardial effusion and large right pleural effusion. Now s/p subxyphoid pericardial window and R chest tube placement on 12/15.  Talc pleurodesis/ talc splurry on 12/24.  12/25 Chest tube removed.      PT Comments    Pt remains to present in deconditioned state but is making progress towards her goals.  She remains unsafe to return home until strength improves and she becomes safer with RW use.  Pt continues to benefit from rehab in a post acute setting before returning home.     BP pre- 95/63 BP post- 100/78    Follow Up Recommendations  CIR     Equipment Recommendations  Other (comment)(TBD at next venue)    Recommendations for Other Services OT consult     Precautions / Restrictions Precautions Precautions: Fall Restrictions Weight Bearing Restrictions: No    Mobility  Bed Mobility Overal bed mobility: Needs Assistance Bed Mobility: Supine to Sit Rolling: Supervision         General bed mobility comments: Pt required increased time to achieve sitting edge of bed as she remains deconditioned.    Transfers Overall transfer level: Needs assistance Equipment used: Rolling walker (2 wheeled) Transfers: Sit to/from Stand Sit to Stand: Min guard         General transfer comment: Cues for hand placement to and from seated surface.  pt performed from bed and BSC.    Ambulation/Gait Ambulation/Gait assistance: Min guard Ambulation Distance (Feet): 100 Feet(x2 trials) Assistive device: Rolling walker (2 wheeled) Gait Pattern/deviations: Step-through pattern;Decreased stride length Gait velocity: Decreased Gait  velocity interpretation: Below normal speed for age/gender General Gait Details: Pt remains slow and guarded, required cues for RW safety and increasing stride length into RW.  Pt on RA and desaturated to 84%, patient required cues for pursed lip breathing to improve O2 sats greater than 90%.     Stairs            Wheelchair Mobility    Modified Rankin (Stroke Patients Only)       Balance Overall balance assessment: Needs assistance Sitting-balance support: No upper extremity supported;Feet supported Sitting balance-Leahy Scale: Fair     Standing balance support: No upper extremity supported;Single extremity supported;During functional activity Standing balance-Leahy Scale: Fair Standing balance comment: Statically able to stand at sink during grooming tasks with min guard assist.                             Cognition Arousal/Alertness: Awake/alert Behavior During Therapy: WFL for tasks assessed/performed Overall Cognitive Status: Within Functional Limits for tasks assessed                                        Exercises General Exercises - Upper Extremity Chair Push Up: Strengthening;10 reps(2-3 reps at a time)    General Comments General comments (skin integrity, edema, etc.): VSS throughout session      Pertinent Vitals/Pain Pain Assessment: Faces Pain Score: 6  Faces Pain Scale: Hurts little more Pain Location: chest tube site Pain  Descriptors / Indicators: Aching;Grimacing Pain Intervention(s): Monitored during session;Repositioned    Home Living                      Prior Function            PT Goals (current goals can now be found in the care plan section) Acute Rehab PT Goals Patient Stated Goal: To go to [facility] for rehab Potential to Achieve Goals: Good Progress towards PT goals: Progressing toward goals    Frequency    Min 3X/week      PT Plan Current plan remains appropriate    Co-evaluation               AM-PAC PT "6 Clicks" Daily Activity  Outcome Measure  Difficulty turning over in bed (including adjusting bedclothes, sheets and blankets)?: A Little Difficulty moving from lying on back to sitting on the side of the bed? : A Little Difficulty sitting down on and standing up from a chair with arms (e.g., wheelchair, bedside commode, etc,.)?: Unable Help needed moving to and from a bed to chair (including a wheelchair)?: A Little Help needed walking in hospital room?: A Little Help needed climbing 3-5 steps with a railing? : A Lot 6 Click Score: 15    End of Session Equipment Utilized During Treatment: Gait belt Activity Tolerance: Patient tolerated treatment well;Patient limited by fatigue Patient left: with call bell/phone within reach;with family/visitor present;in chair;with nursing/sitter in room Nurse Communication: Mobility status PT Visit Diagnosis: Other abnormalities of gait and mobility (R26.89);Muscle weakness (generalized) (M62.81)     Time: 9179-1505 PT Time Calculation (min) (ACUTE ONLY): 33 min  Charges:  $Gait Training: 8-22 mins $Therapeutic Activity: 8-22 mins                    G Codes:       Governor Rooks, PTA pager [phone]    Cristela Blue 01/21/2017, 1:44 PM

## 2017-01-21 NOTE — Telephone Encounter (Signed)
Scheduled appt per 12/26 sch msg - left voicemail for patient regarding appts.

## 2017-01-21 NOTE — Progress Notes (Signed)
Oncology Nurse Navigator Documentation  Oncology Nurse Navigator Flowsheets 01/21/2017  Navigator Location [facility]  Navigator Encounter Type Other/I followed up with Dr. Julien Nordmann on Nancy Moore getting out of the hospital soon. He request patient to come and see him or Mikey Bussing (when Dr. Julien Nordmann is in the office) in 1 or 2 weeks with labs. I completed scheduling message.   Patient Visit Type Inpatient  Treatment Phase Treatment  Barriers/Navigation Needs Coordination of Care  Interventions Coordination of Care  Coordination of Care Other  Acuity Level 2  Time Spent with Patient 30

## 2017-01-21 NOTE — Progress Notes (Signed)
Occupational Therapy Treatment Patient Details Name: Nancy Moore MRN: [id number] DOB: 1965/08/20 Today's Date: 01/21/2017    History of present illness Pt is a 51 y.o. female with PMH significant for stage IVlung adenocarcinoma and HTN, who presented to ED on 01/09/17 with worsening SOB. Pt recently had a re-staging chest CT which showed a large, complex pericardial effusion and large right pleural effusion. Now s/p subxyphoid pericardial window and R chest tube placement on 12/15.    OT comments  Pt demonstrating progress toward OT goals. She was able to engage in B UE strengthening tasks in preparation for improved ADL independence this session with verbal cues and multiple therapeutic rest breaks. She additionally was able to stand at the sink for oral care tasks with min guard assist this session demonstrating improved activity tolerance and static balance. Note pt unable to D/C to CIR and now with plan to D/C to SNF for rehabilitation. Updated D/C recommendation appropriately.    Follow Up Recommendations  SNF(CIR declined)    Equipment Recommendations  3 in 1 bedside commode;Tub/shower bench    Recommendations for Other Services      Precautions / Restrictions Precautions Precautions: Fall Restrictions Weight Bearing Restrictions: No       Mobility Bed Mobility               General bed mobility comments: OOB in recliner on my arrival.   Transfers Overall transfer level: Needs assistance Equipment used: Rolling walker (2 wheeled) Transfers: Sit to/from Stand Sit to Stand: Min guard         General transfer comment: Pt verbally processing safe hand placement.     Balance Overall balance assessment: Needs assistance Sitting-balance support: No upper extremity supported;Feet supported Sitting balance-Leahy Scale: Fair     Standing balance support: No upper extremity supported;Single extremity supported;During functional activity Standing balance-Leahy  Scale: Fair Standing balance comment: Statically able to stand at sink during grooming tasks with min guard assist.                            ADL either performed or assessed with clinical judgement   ADL Overall ADL's : Needs assistance/impaired Eating/Feeding: Set up;Sitting   Grooming: Min guard;Standing                   Toilet Transfer: Min guard;Ambulation;RW Toilet Transfer Details (indicate cue type and reason): Simulated in room         Functional mobility during ADLs: Min guard;Rolling walker General ADL Comments: Pt able to complete standing grooming tasks after B UE strengthening activities. Pt with improving activity tolerance for ADL this session.      Vision   Vision Assessment?: No apparent visual deficits   Perception     Praxis      Cognition Arousal/Alertness: Awake/alert Behavior During Therapy: WFL for tasks assessed/performed Overall Cognitive Status: Within Functional Limits for tasks assessed                                          Exercises Exercises: General Upper Extremity General Exercises - Upper Extremity Chair Push Up: Strengthening;10 reps(2-3 reps at a time)   Shoulder Instructions       General Comments VSS throughout session    Pertinent Vitals/ Pain       Pain Assessment: Faces Faces Pain Scale: Hurts  little more Pain Location: chest tube site; "catch" in L side of back Pain Descriptors / Indicators: Aching;Grimacing Pain Intervention(s): Monitored during session;Repositioned  Home Living                                          Prior Functioning/Environment              Frequency  Min 2X/week        Progress Toward Goals  OT Goals(current goals can now be found in the care plan section)  Progress towards OT goals: Progressing toward goals  Acute Rehab OT Goals Patient Stated Goal: Get stronger at rehab before return home OT Goal Formulation: With  patient Time For Goal Achievement: 01/28/17 Potential to Achieve Goals: Good  Plan Discharge plan remains appropriate    Co-evaluation                 AM-PAC PT "6 Clicks" Daily Activity     Outcome Measure   Help from another person eating meals?: A Little Help from another person taking care of personal grooming?: A Little Help from another person toileting, which includes using toliet, bedpan, or urinal?: A Little Help from another person bathing (including washing, rinsing, drying)?: A Lot Help from another person to put on and taking off regular upper body clothing?: A Little Help from another person to put on and taking off regular lower body clothing?: A Lot 6 Click Score: 16    End of Session Equipment Utilized During Treatment: Rolling walker  OT Visit Diagnosis: Unsteadiness on feet (R26.81)   Activity Tolerance Patient tolerated treatment well   Patient Left in chair;with call bell/phone within reach   Nurse Communication Mobility status        Time: 7121-9758 OT Time Calculation (min): 36 min  Charges: OT General Charges $OT Visit: 1 Visit OT Treatments $Self Care/Home Management : 8-22 mins $Therapeutic Exercise: 8-22 mins  Nancy Herrlich, MS OTR/L  Pager: Nancy Moore 01/21/2017, 1:13 PM

## 2017-01-22 ENCOUNTER — Inpatient Hospital Stay (HOSPITAL_COMMUNITY): Payer: 59

## 2017-01-22 DIAGNOSIS — C3492 Malignant neoplasm of unspecified part of left bronchus or lung: Secondary | ICD-10-CM | POA: Diagnosis present

## 2017-01-22 DIAGNOSIS — I10 Essential (primary) hypertension: Secondary | ICD-10-CM | POA: Diagnosis not present

## 2017-01-22 DIAGNOSIS — R21 Rash and other nonspecific skin eruption: Secondary | ICD-10-CM | POA: Diagnosis not present

## 2017-01-22 DIAGNOSIS — R197 Diarrhea, unspecified: Secondary | ICD-10-CM | POA: Diagnosis not present

## 2017-01-22 DIAGNOSIS — J910 Malignant pleural effusion: Secondary | ICD-10-CM | POA: Diagnosis not present

## 2017-01-22 DIAGNOSIS — I313 Pericardial effusion (noninflammatory): Secondary | ICD-10-CM | POA: Diagnosis not present

## 2017-01-22 DIAGNOSIS — E876 Hypokalemia: Secondary | ICD-10-CM | POA: Diagnosis not present

## 2017-01-22 DIAGNOSIS — Z23 Encounter for immunization: Secondary | ICD-10-CM | POA: Diagnosis not present

## 2017-01-22 DIAGNOSIS — N179 Acute kidney failure, unspecified: Secondary | ICD-10-CM | POA: Diagnosis not present

## 2017-01-22 DIAGNOSIS — J939 Pneumothorax, unspecified: Secondary | ICD-10-CM

## 2017-01-22 MED ORDER — DM-GUAIFENESIN ER 30-600 MG PO TB12: 1.0000 | ORAL_TABLET | Freq: Two times a day (BID) | ORAL | Status: DC | PRN

## 2017-01-22 MED ORDER — SENNOSIDES-DOCUSATE SODIUM 8.6-50 MG PO TABS: 1.0000 | ORAL_TABLET | Freq: Every day | ORAL | Status: DC

## 2017-01-22 MED ORDER — POTASSIUM CHLORIDE CRYS ER 20 MEQ PO TBCR: 20.0000 meq | EXTENDED_RELEASE_TABLET | Freq: Two times a day (BID) | ORAL | Status: DC

## 2017-01-22 MED ORDER — MAGNESIUM OXIDE 400 (241.3 MG) MG PO TABS
400.0000 mg | ORAL_TABLET | Freq: Every day | ORAL | Status: AC
Start: 2017-01-23 — End: ?

## 2017-01-22 MED ORDER — FUROSEMIDE 10 MG/ML IJ SOLN
40.0000 mg | Freq: Once | INTRAMUSCULAR | Status: AC
  Administered 2017-01-22: 40 mg via INTRAVENOUS
  Filled 2017-01-22: qty 4

## 2017-01-22 MED ORDER — ALBUTEROL SULFATE (2.5 MG/3ML) 0.083% IN NEBU: 2.5000 mg | INHALATION_SOLUTION | Freq: Four times a day (QID) | RESPIRATORY_TRACT | 12 refills | Status: DC | PRN

## 2017-01-22 MED ORDER — OXYCODONE HCL 10 MG PO TABS: 10.0000 mg | ORAL_TABLET | ORAL | 0 refills | Status: DC | PRN

## 2017-01-22 MED ORDER — PANTOPRAZOLE SODIUM 40 MG PO TBEC: 40.0000 mg | DELAYED_RELEASE_TABLET | Freq: Every day | ORAL | Status: DC

## 2017-01-22 MED ORDER — AMMONIUM LACTATE 12 % EX LOTN: TOPICAL_LOTION | CUTANEOUS | 0 refills | Status: DC | PRN

## 2017-01-22 MED ORDER — ZOLPIDEM TARTRATE 5 MG PO TABS: 5.0000 mg | ORAL_TABLET | Freq: Every evening | ORAL | 0 refills | Status: DC | PRN

## 2017-01-22 NOTE — Progress Notes (Signed)
Pt going to [facility] via [facility] with belongings. No complaints at this time. Packet given to [facility] to give to facility. Explained and discussed discharge instructions earlier with pt. Care note on pericardial effussion and pleural effusion as well as compression stockings. Prescriptions placed in packet.

## 2017-01-22 NOTE — Progress Notes (Signed)
Clinical Social Worker facilitated patient discharge including contacting patient family and facility to confirm patient discharge plans.  Clinical information faxed to facility and family agreeable with plan.  CSW arranged ambulance transport via [facility] to [facility] .  RN Arbie Cookey to call [phone] (pt will go in room 101B) report prior to discharge.  Clinical Social Worker will sign off for now as social work intervention is no longer needed. Please consult [place] again if new need arises.  Rhea Pink, MSW, [place]

## 2017-01-22 NOTE — Plan of Care (Signed)
Continue current care plan 

## 2017-01-22 NOTE — Progress Notes (Signed)
Went to main pharmacy to get pt's own med gilotrif and given to pt. Pt put meds in her small bag (pocket book).

## 2017-01-22 NOTE — Progress Notes (Signed)
CSW following patient for support and discharge needs. CSW faxed out patient to chosen facility and is awaiting SNF beds. CSW to follow up with patient once beds are available.   Rhea Pink, MSW,  [place]

## 2017-01-22 NOTE — Progress Notes (Signed)
Report called to [facility] pt going to [facility].

## 2017-01-22 NOTE — Progress Notes (Signed)
Inpatient Rehabilitation  Discussed denial for IP Rehab admission with patient, she is in agreement that SNF level of post acute rehab is more appropriate got her endurance at this time.  Will sign off.    Carmelia Roller., CCC/SLP Admission Coordinator  [place]  Cell [phone]

## 2017-01-22 NOTE — Significant Event (Addendum)
Rapid Response Event Note  Central line needed to be removed, RN was able to start a new PIV and after it was established, RIJ double lumen central line was removed without complication.  Site was intact, catheter was removed with ease, catheter was intact, pressure was held, and occlusive pressure was applied.  Patient tolerated well.  NO RRT INTERVENTIONS. RN was instructed to monitor site for bleeding, no bleeding after catheter was removed.

## 2017-01-22 NOTE — Discharge Summary (Signed)
Physician Discharge Summary  Nancy Moore IEP:[id number] DOB: 01-12-1966 DOA: 01/09/2017  PCP: Darreld Mclean, MD  Admit date: 01/09/2017 Discharge date: 01/22/2017  Admitted From: Home Discharge disposition: SNF   Recommendations for Outpatient Follow-Up:   1. F/U CVTS, oncology. 2. Repeat BMET & Mg 01/23/17 to ensure stable electrolytes.   Discharge Diagnosis:   Principal Problem:   Pericardial effusion Active Problems:   Adenocarcinoma of left lung, stage 4 (HCC)   Essential hypertension   Pleural effusion   Hypokalemia   Dyspnea   Leukocytosis   AKI (acute kidney injury) ([place])   Palliative care encounter   Chest tube in place   Acute lower UTI   Pain   Benign essential HTN   Acute blood loss anemia   Cardiac/pericardial tamponade  Discharge Condition: Improved.  Diet recommendation: Low sodium, heart healthy.    Wound care: May cover chest tube site (below midline incision) with a band aid or dry 2x2s with tape and change daily. If no drainage, let open to air. Right chest tube dressings may be removed and patient may shower.  Code status: Limited: No chest compressions.   History of Present Illness:   Nancy Moore is an 51 y.o. female with a PMH of stage IV lung adenocarcinoma complicated by massive pericardial and pleural effusions who was admitted 01/09/17 with severe shortness of breath. Treated with right chest tube and a pericardial window.  Hospital Course by Problem:   Principal Problem:   Shortness of breath secondary to massive malignant pericardial effusion/early tamponade as well as malignant right pleural effusion in the setting of stage IV adenocarcinoma of the lung Status post pericardial window 01/10/17 with cytology positive for malignant cells. Status post right chest tube placement on the same day, also with positive cytology. Underwent talc pleurodesis 01/19/17. Chest tube discontinued 01/20/17. Follow-up with CVTS & oncologist 1-2  weeks post discharge. Continue Afatinib and GILOTRIF.   Active Problems:   Acute on chronic systolic CHF  TEE done 66/06/30 and showed an EF of 45-50 percent with a large circumferential pericardial effusion with tamponade. Continue to diurese as blood pressure tolerates. Currently being managed with Lasix 40 mg daily. CXR today looks worse.  Breathing OK but legs feel tighter. Will give an additional dose of IV lasix prior to discharge.    Essential hypertension Blood pressures soft. On Lasix. Hydralazine ordered as needed.    Hypokalemia/hypomagnesemia Secondary to diuretics. Placed on routine supplementation.    AKI (acute kidney injury) ([place]) Resolved.    Palliative care encounter Evaluated by palliative care team 01/13/17. She is currently a partial code with no chest compressions. Continues to receive aggressive care.    Acute lower UTI Klebsiella UTI treated, off antibiotics now.    Obesity Body mass index is 37.86 kg/m.   Medical Consultants:    CVTS  Palliative Care  Telephone consultation with oncology  Discharge Exam:   Vitals:   01/22/17 0233 01/22/17 0756  BP: 94/62 (!) 109/54  Pulse: (!) 102 (!) 110  Resp: 13 14  Temp: 98.2 F (36.8 C) 97.6 F (36.4 C)  SpO2: 93% 98%   Vitals:   01/21/17 1600 01/21/17 2333 01/22/17 0233 01/22/17 0756  BP: 94/69 90/70 94/62  (!) 109/54  Pulse: (!) 111 100 (!) 102 (!) 110  Resp: 14 13 13 14   Temp: 99.9 F (37.7 C) 98.4 F (36.9 C) 98.2 F (36.8 C) 97.6 F (36.4 C)  TempSrc: Oral Oral Oral Oral  SpO2: 96%  93% 93% 98%  Weight:  106.4 kg (234 lb 9.1 oz)    Height:       General: No acute distress. Pale and chronically ill appearing, unchanged. Cardiovascular: Heart sounds show a regular rate, and rhythm. No gallops or rubs. No murmurs. No JVD. Unchanged. Lungs: Diminished in the bases bilaterally. No rales, rhonchi or wheezes. Unchanged. Abdomen: Soft, nontender, nondistended with normal active bowel  sounds. No masses. No hepatosplenomegaly. Unchanged. Skin: Pale. Warm and dry. No rashes or lesions.Unchanged. Extremities: Massive lower extremity edema with tight skin. Slightly worse.  The results of significant diagnostics from this hospitalization (including imaging, microbiology, ancillary and laboratory) are listed below for reference.     Procedures and Diagnostic Studies:   Dg Chest 2 View  Result Date: 01/09/2017 CLINICAL DATA:  Shortness of Breath EXAM: CHEST  2 VIEW COMPARISON:  Chest CT January 08, 2017. Chest radiograph October 21, 2016 FINDINGS: There are fairly small pleural effusions bilaterally. There is patchy airspace opacity in the left upper lobe as well as in both lower lobes. There is mild upper lobe interstitial edema. There is cardiomegaly with suspected pericardial effusion. Note that there is splaying of the carina. No adenopathy. No bone lesions appreciable. IMPRESSION: Cardiomegaly with suspected pericardial effusion. Pleural effusions bilaterally with areas of patchy airspace opacity, consistent with either pneumonia or alveolar edema. Both entities may be present concurrently. There is mild upper lobe interstitial edema. Electronically Signed   By: Lowella Grip III M.D.   On: 01/09/2017 16:34   Dg Chest Port 1 View  Result Date: 01/11/2017 CLINICAL DATA:  Postoperative radiograph. Assess for pneumothorax. Patient has chest tubes in place. EXAM: PORTABLE CHEST 1 VIEW COMPARISON:  Chest radiograph performed 01/09/2017 FINDINGS: Bilateral chest tubes are noted. There is a residual small left pleural effusion. No significant pneumothorax is seen. Left perihilar airspace opacity may reflect atelectasis or possibly pneumonia. Underlying vascular congestion is noted. The cardiomediastinal silhouette is borderline enlarged. A right IJ line is noted ending about the distal SVC. No acute osseous abnormalities are seen. IMPRESSION: 1. Residual small left pleural  effusion. No significant pneumothorax seen. 2. Vascular congestion and borderline cardiomegaly. Left perihilar airspace opacity may reflect atelectasis or possibly pneumonia. Electronically Signed   By: Garald Balding M.D.   On: 01/11/2017 00:27     Labs:   Basic Metabolic Panel: Recent Labs  Lab 01/16/17 0426 01/17/17 0431 01/18/17 0500 01/19/17 0600 01/20/17 0703 01/21/17 0317  NA 134* 134* 134* 137 134* 135  K 3.4* 3.4* 3.6 3.7 3.5 3.7  CL 102 100* 100* 99* 96* 99*  CO2 28 29 28 31 29 30   GLUCOSE 108* 118* 95 97 109* 121*  BUN 7 6 7 8 10 9   CREATININE 0.81 0.76 0.74 0.83 0.83 0.87  CALCIUM 8.3* 8.0* 8.1* 8.4* 8.3* 8.2*  MG 1.8 1.7 1.7  --  1.4* 1.8   GFR Estimated Creatinine Clearance: 95.4 mL/min (by C-G formula based on SCr of 0.87 mg/dL).  CBC: Recent Labs  Lab 01/16/17 0426 01/18/17 0500 01/19/17 0600 01/20/17 0703 01/21/17 0317  WBC 11.3* 9.0 10.7* 14.0* 11.9*  NEUTROABS 7.3  --   --   --   --   HGB 10.4* 9.7* 9.4* 9.5* 9.0*  HCT 32.5* 30.5* 30.0* 30.0* 28.2*  MCV 82.9 81.3 82.2 81.5 81.5  PLT 283 292 312 317 311    Discharge Instructions:   Discharge Instructions    Call MD for:  difficulty breathing, headache or visual disturbances  Complete by:  As directed    Call MD for:  extreme fatigue   Complete by:  As directed    Call MD for:  persistant nausea and vomiting   Complete by:  As directed    Call MD for:  severe uncontrolled pain   Complete by:  As directed    Diet - low sodium heart healthy   Complete by:  As directed    Increase activity slowly   Complete by:  As directed    Walk with assistance   Complete by:  As directed    Walker    Complete by:  As directed      Allergies as of 01/22/2017      Reactions   Percocet [oxycodone-acetaminophen] Other (See Comments)   "makes me dizzy"   Lisinopril Cough   Losartan    Valsartan Cough      Medication List    TAKE these medications   afatinib dimaleate 40 MG tablet Commonly  known as:  GILOTRIF Take 1 tablet (40 mg total) by mouth daily. Take on an empty stomach 1hr before or 2 hrs after meals.   albuterol (2.5 MG/3ML) 0.083% nebulizer solution Commonly known as:  PROVENTIL Take 3 mLs (2.5 mg total) by nebulization every 6 (six) hours as needed for wheezing or shortness of breath.   ammonium lactate 12 % lotion Commonly known as:  LAC-HYDRIN Apply topically as needed for dry skin.   clindamycin 1 % gel Commonly known as:  CLINDAGEL Apply topically 2 (two) times daily.   dextromethorphan-guaiFENesin 30-600 MG 12hr tablet Commonly known as:  MUCINEX DM Take 1 tablet by mouth 2 (two) times daily as needed for cough.   furosemide 40 MG tablet Commonly known as:  LASIX Take 1 tablet (40 mg total) by mouth daily.   loperamide 1 MG/5ML solution Commonly known as:  IMODIUM Take 2 mg as needed by mouth for diarrhea or loose stools.   magnesium oxide 400 (241.3 Mg) MG tablet Commonly known as:  MAG-OX Take 1 tablet (400 mg total) by mouth daily. Start taking on:  01/23/2017   Oxycodone HCl 10 MG Tabs Take 1 tablet (10 mg total) by mouth every 4 (four) hours as needed for moderate pain.   pantoprazole 40 MG tablet Commonly known as:  PROTONIX Take 1 tablet (40 mg total) by mouth daily at 12 noon. Start taking on:  01/23/2017   potassium chloride SA 20 MEQ tablet Commonly known as:  K-DUR,KLOR-CON Take 1 tablet (20 mEq total) by mouth 2 (two) times daily.   prochlorperazine 10 MG tablet Commonly known as:  COMPAZINE Take 1 tablet (10 mg total) every 6 (six) hours as needed by mouth for nausea or vomiting.   senna-docusate 8.6-50 MG tablet Commonly known as:  Senokot-S Take 1 tablet by mouth at bedtime.   zolpidem 5 MG tablet Commonly known as:  AMBIEN Take 1 tablet (5 mg total) by mouth at bedtime as needed for sleep.      Follow-up Information    Ivin Poot, MD Follow up.   Specialty:  Cardiothoracic Surgery Why:  PA/LAT CXR to be  taken (at [place] which is in the same building as Dr. Lucianne Lei Trigt's office) one hour prior to office visit;Office will call or mail appointment date and time. Contact information: [address] [address] [place] [address] [phone]            Time coordinating discharge: 35 minutes.  Signed:  Margreta Journey Rama  Pager [phone] [facility] 01/22/2017, 12:23 PM

## 2017-01-22 NOTE — Care Management Note (Signed)
Case Management Note Original note by: Maryclare Labrador, RN  Case Manager  CASE MANAGEMENT  Care Management Note  Signed  Date of Service:  01/19/2017 11:44 AM     Patient Details  Name: Nancy Moore MRN: [id number] Date of Birth: 08-24-1965  Subjective/Objective:    Pt admitted with malignant pleural effusions  - pt has stage IV Lung CA              Action/Plan:   PTA independent from home.  CM requested Palliative Consult for Goals of Care - including recommendations for community programs offering palliative and or hospice.  Pt will need PT eval once medically stable from cardiothoracic standpoint.     Expected Discharge Date:  01/22/17               Expected Discharge Plan:  Skilled Nursing Facility  In-House Referral:  Clinical Social Work  Discharge planning Services  CM Consult  Post Acute Care Choice:    Choice offered to:     DME Arranged:    DME Agency:     HH Arranged:    [place] Agency:     Status of Service:  Completed, signed off  If discussed at [name], dates discussed:     Additional Comments: 01/22/17 J. Jerris Keltz, RN, BSN   Pt medically stable for dc to SNF today, per CSW arrangements.   01/19/17 Pt still has CT.  CSW working on placement  01/14/17 CIR recommended - CM requested CIR consult from attending - [place] consulted for back up plan with SNF Ella Bodo, RN 01/22/2017, 4:21 PM

## 2017-01-22 NOTE — Progress Notes (Addendum)
      [address]       [place] [id number]             [phone]      12 Days Post-Op Procedure(s) (LRB): PERICARDIAL WINDOW- SUB XYPHOID, RIGHT CHEST TUBE (N/A)   Subjective:  No new complaints.  Continues to express frustration of having to contact CSW herself to get placement things going.    Objective: Vital signs in last 24 hours: Temp:  [98.2 F (36.8 C)-99.9 F (37.7 C)] 98.2 F (36.8 C) (12/27 0233) Pulse Rate:  [100-111] 102 (12/27 0233) Cardiac Rhythm: Normal sinus rhythm (12/27 0700) Resp:  [13-14] 13 (12/27 0233) BP: (90-100)/(62-78) 94/62 (12/27 0233) SpO2:  [93 %-97 %] 93 % (12/27 0233) Weight:  [234 lb 9.1 oz (106.4 kg)] 234 lb 9.1 oz (106.4 kg) (12/26 2333)  Intake/Output from previous day: 12/26 0701 - 12/27 0700 In: 840 [P.O.:840] Out: 200 [Urine:200]  General appearance: alert, cooperative and no distress Heart: regular rate and rhythm Lungs: diminished breath sounds bibasilar Abdomen: soft, non-tender; bowel sounds normal; no masses,  no organomegaly Extremities: edema 1-2+ Wound: clean and dry  Lab Results: Recent Labs    01/20/17 0703 01/21/17 0317  WBC 14.0* 11.9*  HGB 9.5* 9.0*  HCT 30.0* 28.2*  PLT 317 311   BMET:  Recent Labs    01/20/17 0703 01/21/17 0317  NA 134* 135  K 3.5 3.7  CL 96* 99*  CO2 29 30  GLUCOSE 109* 121*  BUN 10 9  CREATININE 0.83 0.87  CALCIUM 8.3* 8.2*    PT/INR: No results for input(s): LABPROT, INR in the last 72 hours. ABG    Component Value Date/Time   PHART 7.399 01/11/2017 0422   HCO3 27.3 01/11/2017 0422   TCO2 24 01/10/2017 0234   O2SAT 97.0 01/11/2017 0422   CBG (last 3)  No results for input(s): GLUCAP in the last 72 hours.  Assessment/Plan: S/P Procedure(s) (LRB): PERICARDIAL WINDOW- SUB XYPHOID, RIGHT CHEST TUBE (N/A)  1. Chest tube removed several days ago--- follow up CXR today shows pulmonary edema, stable hydropneuthorax on right, questionable moderate  pericardial effusion 2. Pulm- continue IS 3. Renal- creatinine has been WNL, supplementing K and Mg--- may benefit from IV lasix with worsening LE edema and underlying pulmonary edema on CXR 4. Deconditioning-awaiting SNF placement 5. Dispo- plan per primary   LOS: 13 days    Erin Barrett 01/22/2017 patient examined PA and Lateral CXR image and medical record reviewed,agree with above note. No sig R pleural effusion Cont lasix 62m po daily \ Will f/u in office- leave chest tube suture intact for removal in office PTharon AquasTrigt III 01/22/2017

## 2017-01-22 NOTE — Discharge Instructions (Signed)
Chest Tube Insertion, Adult A chest tube is a thin, flexible tube that is inserted into the space between your lung and your chest wall. You may need a chest tube if you have a collapsed lung from illness or a severe injury. A collapsed lung can be caused by:  An air leak (pneumothorax).  Blood collection (hemothorax).  Fluid buildup from an infection (empyema).  The chest tube drains the fluid or air from your lung. It may be attached to a suction device to help with drainage. You will need to stay in the hospital while the chest tube is in place. Tell a health care provider about:  Any allergies you have.  All medicines you are taking, including vitamins, herbs, eye drops, creams, and over-the-counter medicines.  Any problems you or family members have had with anesthetic medicines.  Any blood disorders you have.  Any surgeries you have had.  Any medical conditions you have, including any recent fever or cold symptoms.  Whether you are pregnant or may be pregnant. What are the risks? Generally, this is a safe procedure. However, problems may occur, including:  Bleeding.  Infection.  Allergic reaction to medicines.  Lung damage.  Damage to the blood vessels or nerves near the lung.  Failure of the chest tube to work properly.  What happens before the procedure? Staying hydrated Follow instructions from your health care provider about hydration, which may include:  Up to 2 hours before the procedure - you may continue to drink clear liquids, such as water, clear fruit juice, black coffee, and plain tea.  Eating and drinking restrictions Follow instructions from your health care provider about eating and drinking, which may include:  8 hours before the procedure - stop eating heavy meals or foods such as meat, fried foods, or fatty foods.  6 hours before the procedure - stop eating light meals or foods, such as toast or cereal.  6 hours before the procedure -  stop drinking milk or drinks that contain milk.  2 hours before the procedure - stop drinking clear liquids.  Medicines  Ask your health care provider about: ? Changing or stopping your regular medicines. This is especially important if you are taking diabetes medicines or blood thinners. ? Taking medicines such as aspirin and ibuprofen. These medicines can thin your blood. Do not take these medicines before your procedure if your health care provider instructs you not to. General instructions  You will have a chest X-ray or other imaging studies of the lung.  Plan to have someone take you home from the hospital or clinic.  If you will be going home right after the procedure, plan to have someone with you for 24 hours. What happens during the procedure?  To reduce your risk of infection: ? Your health care team will wash or sanitize their hands. ? Your skin will be washed with soap. ? Hair may be removed from the surgical area.  An IV tube will be inserted into one of your veins.  You will be given one or more of the following: ? A medicine to help you relax (sedative). ? A medicine to numb the area (local anesthetic).  You may be given antibiotic and pain medicines through the IV tube.  The surgeon will make a small incision in a space between your ribs.  The chest tube will be placed through the incision into the space between the lung and your chest wall.  Stitches (sutures) will be used to  close the incision around the tube.  The chest tube may be attached to a suction device.  The incision site will be covered with an airtight bandage (dressing).  Another chest X-ray will be done to check the position of the tube. The procedure may vary among health care providers and hospitals. What happens after the procedure?  Your blood pressure, heart rate, breathing rate, and blood oxygen level will be monitored until the medicines you were given have worn off.  You may  continue to get pain medicine or antibiotics through the IV tube.  The tube and dressing will be checked regularly.  You will be encouraged to cough and take deep breaths.  You may be given oxygen to breathe.  Chest X-rays will be done to find out if the lung is inflating. After the lung is inflated: ? Another chest X-ray may be done. ? The chest tube can be removed after the lung remains inflated and you are breathing easily. ? A new dressing will be put on.  Do not drive for 24 hours if you were given a sedative. This information is not intended to replace advice given to you by your health care provider. Make sure you discuss any questions you have with your health care provider. Document Released: 04/23/2006 Document Revised: 08/03/2015 Document Reviewed: 06/27/2015 Elsevier Interactive Patient Education  2018 [place]   May cover chest tube site (below midline incision) with a band aid or dry 2x2s with tape and change daily. If no drainage, let open to air. Right chest tube dressings may be removed and patient may shower

## 2017-01-28 ENCOUNTER — Ambulatory Visit (INDEPENDENT_AMBULATORY_CARE_PROVIDER_SITE_OTHER): Payer: Self-pay

## 2017-01-28 DIAGNOSIS — Z4802 Encounter for removal of sutures: Secondary | ICD-10-CM

## 2017-01-28 NOTE — Progress Notes (Signed)
Removed 2 sutures from chest tube sites, NO signs of infection and patient tolerated well.

## 2017-02-03 ENCOUNTER — Inpatient Hospital Stay: Payer: 59 | Attending: Oncology | Admitting: Oncology

## 2017-02-03 ENCOUNTER — Encounter: Payer: Self-pay | Admitting: Oncology

## 2017-02-03 ENCOUNTER — Other Ambulatory Visit: Payer: Self-pay

## 2017-02-03 ENCOUNTER — Other Ambulatory Visit: Payer: Self-pay | Admitting: Oncology

## 2017-02-03 ENCOUNTER — Inpatient Hospital Stay: Payer: 59

## 2017-02-03 ENCOUNTER — Telehealth: Payer: Self-pay | Admitting: Internal Medicine

## 2017-02-03 ENCOUNTER — Encounter: Payer: Self-pay | Admitting: *Deleted

## 2017-02-03 VITALS — BP 129/85 | HR 104 | Temp 98.1°F | Resp 18 | Ht 66.0 in | Wt 219.4 lb

## 2017-02-03 DIAGNOSIS — C3492 Malignant neoplasm of unspecified part of left bronchus or lung: Secondary | ICD-10-CM

## 2017-02-03 DIAGNOSIS — Z23 Encounter for immunization: Secondary | ICD-10-CM | POA: Insufficient documentation

## 2017-02-03 DIAGNOSIS — R21 Rash and other nonspecific skin eruption: Secondary | ICD-10-CM | POA: Insufficient documentation

## 2017-02-03 DIAGNOSIS — E876 Hypokalemia: Secondary | ICD-10-CM

## 2017-02-03 DIAGNOSIS — J910 Malignant pleural effusion: Secondary | ICD-10-CM | POA: Diagnosis not present

## 2017-02-03 DIAGNOSIS — R197 Diarrhea, unspecified: Secondary | ICD-10-CM | POA: Diagnosis not present

## 2017-02-03 DIAGNOSIS — Z5111 Encounter for antineoplastic chemotherapy: Secondary | ICD-10-CM

## 2017-02-03 LAB — CMP (CANCER CENTER ONLY)
ALT: 18 U/L (ref 0–55)
AST: 24 U/L (ref 5–34)
Albumin: 2.9 g/dL — ABNORMAL LOW (ref 3.5–5.0)
Alkaline Phosphatase: 79 U/L (ref 40–150)
Anion gap: 10 (ref 3–11)
BUN: 5 mg/dL — ABNORMAL LOW (ref 7–26)
CO2: 26 mmol/L (ref 22–29)
Calcium: 8.9 mg/dL (ref 8.4–10.4)
Chloride: 106 mmol/L (ref 98–109)
Creatinine: 0.8 mg/dL (ref 0.70–1.30)
GFR, Est AFR Am: >60 mL/min (ref >60)
GFR, Estimated: >60 mL/min (ref >60)
Glucose, Bld: 92 mg/dL (ref 70–140)
Potassium: 3.6 mmol/L (ref 3.3–4.7)
Sodium: 142 mmol/L (ref 136–145)
Total Bilirubin: 0.5 mg/dL (ref 0.2–1.2)
Total Protein: 6.5 g/dL (ref 6.4–8.3)

## 2017-02-03 LAB — CBC WITH DIFFERENTIAL (CANCER CENTER ONLY)
Abs Granulocyte: 5.8 10*3/uL (ref 1.5–6.5)
Basophils Absolute: 0.1 10*3/uL (ref 0.0–0.1)
Basophils Relative: 1 %
Eosinophils Absolute: 0.7 10*3/uL — ABNORMAL HIGH (ref 0.0–0.5)
Eosinophils Relative: 7 %
HCT: 33.5 % — ABNORMAL LOW (ref 34.8–46.6)
Hemoglobin: 10.1 g/dL — ABNORMAL LOW (ref 11.6–15.9)
Lymphocytes Relative: 28 %
Lymphs Abs: 2.9 10*3/uL (ref 0.9–3.3)
MCH: 25.6 pg (ref 25.1–34.0)
MCHC: 30.1 g/dL — ABNORMAL LOW (ref 31.5–36.0)
MCV: 84.8 fL (ref 79.5–101.0)
Monocytes Absolute: 1.1 10*3/uL — ABNORMAL HIGH (ref 0.1–0.9)
Monocytes Relative: 10 %
Neutro Abs: 5.8 10*3/uL (ref 1.5–6.5)
Neutrophils Relative %: 54 %
Platelet Count: 408 10*3/uL — ABNORMAL HIGH (ref 145–400)
RBC: 3.95 MIL/uL (ref 3.70–5.45)
RDW: 18.7 % — ABNORMAL HIGH (ref 11.2–16.1)
WBC Count: 10.5 10*3/uL — ABNORMAL HIGH (ref 4.0–10.3)

## 2017-02-03 NOTE — Telephone Encounter (Signed)
Gave avs and calendar for January  °

## 2017-02-03 NOTE — Progress Notes (Signed)
[facility] OFFICE PROGRESS NOTE  Copland, Gay Filler, MD [address] [place] [address]  DIAGNOSIS: stage IV (T3, N2, M1 a) non-small cell lung cancer, poorly differentiated adenocarcinoma with extensive miliary distribution in the lungs bilaterally diagnosed in September 2018. 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 NEGATIVE for the Exon 20 T790M mutation  PRIOR THERAPY: None  CURRENT THERAPY: Gilotrif 40 mg daily started 11/08/2016.  INTERVAL HISTORY: Nancy Moore 52 y.o. female returns for routine follow-up visit accompanied by her sister.  Patient was recently hospitalized for a very large pericardial effusion and right pleural effusion.  She underwent a pericardial window and had a right chest tube placed.  She also had talc pleurodesis performed.  Both the pericardial fluid and pleural fluid were positive for malignant cells.  The patient was discharged to a skilled facility for rehabilitation.  The patient is feeling better today.  She is due to be discharged from the skilled facility today back to home.  She is no longer wearing oxygen.  She denies fevers and chills.  Denies chest pain, shortness of breath, cough, hemoptysis.  She reports lower extremity edema and is currently on Lasix.  Denies nausea, vomiting, constipation.  She has intermittent diarrhea and uses Imodium as needed.  She has a mild rash to her face.  She continues to tolerate her Gilotrif fairly well.  She is here for evaluation and repeat blood work.  MEDICAL HISTORY: Past Medical History:  Diagnosis Date  . Adenocarcinoma of left lung, stage 4 ([place]) 10/28/2016  . Goals of care, counseling/discussion 10/28/2016  . Hypertension     ALLERGIES:  is allergic to percocet [oxycodone-acetaminophen]; lisinopril; losartan; and valsartan.  MEDICATIONS:  Current Outpatient Medications  Medication Sig Dispense Refill  . afatinib dimaleate (GILOTRIF) 40 MG tablet Take 1 tablet (40 mg  total) by mouth daily. Take on an empty stomach 1hr before or 2 hrs after meals. 30 tablet 1  . ammonium lactate (LAC-HYDRIN) 12 % lotion Apply topically as needed for dry skin. 400 g 0  . clindamycin (CLINDAGEL) 1 % gel Apply topically 2 (two) times daily. 30 g 1  . furosemide (LASIX) 40 MG tablet Take 1 tablet (40 mg total) by mouth daily. 90 tablet 3  . magnesium oxide (MAG-OX) 400 (241.3 Mg) MG tablet Take 1 tablet (400 mg total) by mouth daily.    Marland Kitchen oxyCODONE 10 MG TABS Take 1 tablet (10 mg total) by mouth every 4 (four) hours as needed for moderate pain. 10 tablet 0  . pantoprazole (PROTONIX) 40 MG tablet Take 1 tablet (40 mg total) by mouth daily at 12 noon.    . potassium chloride SA (K-DUR,KLOR-CON) 20 MEQ tablet Take 1 tablet (20 mEq total) by mouth 2 (two) times daily.    . prochlorperazine (COMPAZINE) 10 MG tablet Take 1 tablet (10 mg total) every 6 (six) hours as needed by mouth for nausea or vomiting. 30 tablet 1  . dextromethorphan-guaiFENesin (MUCINEX DM) 30-600 MG 12hr tablet Take 1 tablet by mouth 2 (two) times daily as needed for cough. (Patient not taking: Reported on 02/03/2017)    . loperamide (IMODIUM) 1 MG/5ML solution Take 2 mg as needed by mouth for diarrhea or loose stools.    . senna-docusate (SENOKOT-S) 8.6-50 MG tablet Take 1 tablet by mouth at bedtime. (Patient not taking: Reported on 02/03/2017)     No current facility-administered medications for this visit.     SURGICAL HISTORY:  Past Surgical History:  Procedure Laterality Date  . BREAST EXCISIONAL BIOPSY Left 20+ yrs ago   benign  . PERICARDIAL WINDOW N/A 01/10/2017   Procedure: PERICARDIAL WINDOW- SUB XYPHOID, RIGHT CHEST TUBE;  Surgeon: Ivin Poot, MD;  Location: [facility];  Service: Thoracic;  Laterality: N/A;  . VIDEO BRONCHOSCOPY Bilateral 10/21/2016   Procedure: VIDEO BRONCHOSCOPY WITH FLUORO;  Surgeon: Tanda Rockers, MD;  Location: [facility];  Service: Cardiopulmonary;  Laterality: Bilateral;     REVIEW OF SYSTEMS:   Review of Systems  Constitutional: Negative for appetite change, chills, fatigue, fever and unexpected weight change.  HENT:   Negative for mouth sores, nosebleeds, sore throat and trouble swallowing.   Eyes: Negative for eye problems and icterus.  Respiratory: Negative for cough, hemoptysis, shortness of breath and wheezing.   Cardiovascular: Negative for chest pain. Positive for swelling of the lower extremities. Gastrointestinal: Negative for abdominal pain, constipation, diarrhea, nausea and vomiting.  Genitourinary: Negative for bladder incontinence, difficulty urinating, dysuria, frequency and hematuria.   Musculoskeletal: Negative for back pain, gait problem, neck pain and neck stiffness.  Skin: Negative for itching and rash.  Neurological: Negative for dizziness, extremity weakness, gait problem, headaches, light-headedness and seizures.  Hematological: Negative for adenopathy. Does not bruise/bleed easily.  Psychiatric/Behavioral: Negative for confusion, depression and sleep disturbance. The patient is not nervous/anxious.     PHYSICAL EXAMINATION:  Blood pressure 129/85, pulse (!) 104, temperature 98.1 F (36.7 C), temperature source Oral, resp. rate 18, height 5' 6"  (1.676 m), weight 219 lb 6.4 oz (99.5 kg), last menstrual period 03/15/2010, SpO2 94 %.  ECOG PERFORMANCE STATUS: 1 - Symptomatic but completely ambulatory  Physical Exam  Constitutional: Oriented to person, place, and time and well-developed, well-nourished, and in no distress. No distress.  HENT:  Head: Normocephalic and atraumatic.  Mouth/Throat: Oropharynx is clear and moist. No oropharyngeal exudate.  Eyes: Conjunctivae are normal. Right eye exhibits no discharge. Left eye exhibits no discharge. No scleral icterus.  Neck: Normal range of motion. Neck supple.  Cardiovascular: Normal rate, regular rhythm, normal heart sounds and intact distal pulses.  1+ edema to the bilateral lower  extremities.  Pulmonary/Chest: Effort normal and breath sounds normal. No respiratory distress. No wheezes. No rales.  Abdominal: Soft. Bowel sounds are normal. Exhibits no distension and no mass. There is no tenderness.  Musculoskeletal: Normal range of motion. Exhibits no edema.  Lymphadenopathy:    No cervical adenopathy.  Neurological: Alert and oriented to person, place, and time. Exhibits normal muscle tone. Gait normal. Coordination normal.  Skin: Skin is warm and dry. No rash noted. Not diaphoretic. No erythema. No pallor.  Psychiatric: Mood, memory and judgment normal.  Vitals reviewed.  LABORATORY DATA: Lab Results  Component Value Date   WBC 11.9 (H) 01/21/2017   HGB 9.0 (L) 01/21/2017   HCT 33.5 (L) 02/03/2017   MCV 84.8 02/03/2017   PLT 311 01/21/2017      Chemistry      Component Value Date/Time   NA 142 02/03/2017 1312   NA 135 (L) 01/08/2017 1055   K 3.6 02/03/2017 1312   K 3.2 (L) 01/08/2017 1055   CL 106 02/03/2017 1312   CO2 26 02/03/2017 1312   CO2 25 01/08/2017 1055   BUN 5 (L) 02/03/2017 1312   BUN 14.6 01/08/2017 1055   CREATININE 0.87 01/21/2017 0317   CREATININE 1.3 (H) 01/08/2017 1055      Component Value Date/Time   CALCIUM 8.9 02/03/2017 1312  CALCIUM 9.5 01/08/2017 1055   ALKPHOS 79 02/03/2017 1312   ALKPHOS 82 01/08/2017 1055   AST 24 02/03/2017 1312   AST 31 01/08/2017 1055   ALT 18 02/03/2017 1312   ALT 34 01/08/2017 1055   BILITOT 0.5 02/03/2017 1312   BILITOT 1.74 (H) 01/08/2017 1055       RADIOGRAPHIC STUDIES:  Dg Chest 2 View  Result Date: 01/22/2017 CLINICAL DATA:  52 year old female with history of shortness of breath. Stage IV lung cancer. EXAM: CHEST  2 VIEW COMPARISON:  Chest x-ray 01/20/2017. FINDINGS: Lung volumes remain low. Bibasilar opacities may reflect areas of atelectasis and/or consolidation, with superimposed small to moderate bilateral pleural effusions. Mild diffuse interstitial prominence is similar to the  prior study. Previously noted right-sided pneumothorax remains small (approximately 5-10% of the volume of the right hemithorax). No left-sided pneumothorax. Previously noted right-sided internal jugular central venous catheter has been removed. Mild cardiomegaly. Upper mediastinal contours are within normal limits. IMPRESSION: 1. Small right-sided hydropneumothorax is stable in size. 2. Enlargement of the cardiopericardial silhouette, similar to prior studies, presumably reflective of persistent moderate to large pericardial effusion as demonstrated on prior chest CT 01/08/2017. 3. The appearance of lungs suggests a background of moderate pulmonary edema. 4. Small to moderate left pleural effusion. Electronically Signed   By: Vinnie Langton M.D.   On: 01/22/2017 08:01   Dg Chest 2 View  Result Date: 01/20/2017 CLINICAL DATA:  Pleural effusion right-sided. EXAM: CHEST  2 VIEW COMPARISON:  01/17/2017 FINDINGS: Right IJ central venous catheter unchanged with tip over the SVC. Interval removal of right-sided chest tube with slight worsening of small right-sided pneumothorax. Lungs are hypoinflated with persistent moderate size left effusion likely dysphasia atelectasis. Interval worsening of hazy prominence of the perihilar markings suggesting worsening interstitial edema. Small right effusion slightly worse. Stable cardiomegaly. Remainder of the exam is unchanged. IMPRESSION: Stable cardiomegaly with slight interval worsening hazy perihilar opacification likely interstitial edema. Stable moderate size left pleural effusion and slight worsening small right pleural effusion. Interval removal right-sided chest tube with slight worsening of small right-sided pneumothorax. These results will be called to the ordering clinician or representative by the Radiologist Assistant, and communication documented in the PACS or zVision Dashboard. Electronically Signed   By: Marin Olp M.D.   On: 01/20/2017 07:34   Dg  Chest 2 View  Result Date: 01/09/2017 CLINICAL DATA:  Shortness of Breath EXAM: CHEST  2 VIEW COMPARISON:  Chest CT January 08, 2017. Chest radiograph October 21, 2016 FINDINGS: There are fairly small pleural effusions bilaterally. There is patchy airspace opacity in the left upper lobe as well as in both lower lobes. There is mild upper lobe interstitial edema. There is cardiomegaly with suspected pericardial effusion. Note that there is splaying of the carina. No adenopathy. No bone lesions appreciable. IMPRESSION: Cardiomegaly with suspected pericardial effusion. Pleural effusions bilaterally with areas of patchy airspace opacity, consistent with either pneumonia or alveolar edema. Both entities may be present concurrently. There is mild upper lobe interstitial edema. Electronically Signed   By: Lowella Grip III M.D.   On: 01/09/2017 16:34   Ct Chest W Contrast  Result Date: 01/08/2017 CLINICAL DATA:  Stage IV lung cancer. EXAM: CT CHEST, ABDOMEN, AND PELVIS WITH CONTRAST TECHNIQUE: Multidetector CT imaging of the chest, abdomen and pelvis was performed following the standard protocol during bolus administration of intravenous contrast. CONTRAST:  16m ISOVUE-300 IOPAMIDOL (ISOVUE-300) INJECTION 61% COMPARISON:  PET-CT 10/31/2016 and chest CT 10/10/2016 FINDINGS: CT CHEST  FINDINGS Cardiovascular: Very large pericardial effusion which measures 36 Hounsfield units suggesting complex fluid possibly due to pericardial metastatic disease. The heart is normal in size. No aortic dissection or aneurysm. The branch vessels are patent. Mediastinum/Nodes: No obvious mediastinal or hilar lymphadenopathy. The esophagus is grossly normal. Lungs/Pleura: Large right pleural effusion and small to moderate size left pleural effusion with overlying atelectasis. There is also persistent diffuse nodular interstitial thickening likely reflecting interstitial spread of tumor. Musculoskeletal: Possible sclerotic  metastatic lesion involving the left aspect of T4. No other definite bone lesions. A few tiny sclerotic lesions are possibly bone islands. No breast masses, supraclavicular or axillary lymphadenopathy. Small scattered lymph nodes are noted. CT ABDOMEN PELVIS FINDINGS Hepatobiliary: No focal hepatic lesions to suggest metastatic disease. The gallbladder contains some high attenuation tear which could be sludge or stones. No common bile duct dilatation. Pancreas: No mass, inflammation or ductal dilatation. Spleen: Normal size.  No focal lesions. Adrenals/Urinary Tract: The adrenal glands and kidneys are unremarkable. The bladder is unremarkable. Stomach/Bowel: The stomach, duodenum, small bowel and colon are grossly normal without oral contrast. No inflammatory changes, mass lesions or obstructive findings. The terminal ileum and appendix are normal. Vascular/Lymphatic: Moderate distal aortic and proximal iliac artery calcifications but no aneurysm or dissection. The branch vessels are patent. The major venous structures are patent. Small scattered mesenteric and retroperitoneal lymph nodes but no mass or overt adenopathy. Reproductive: Surgically absent. Other: Small amount free pelvic fluid. No pelvic mass or adenopathy. No inguinal mass or adenopathy. Musculoskeletal: No definite findings for osseous metastatic disease. IMPRESSION: 1. Very large pericardial effusion, significantly increased since the PET-CT of October. 2. Large right and small to moderate left pleural effusions. 3. Nodular interstitial thickening in the lungs most consistent with interstitial spread of tumor and consistent with prior PET-CT findings. 4. No old abdominal/pelvic metastatic disease is identified. 5. Sclerotic metastatic focus involving the T4 vertebral body. This was also hot on the prior PET scan. These results will be called to the ordering clinician or representative by the Radiologist Assistant, and communication documented in the  PACS or zVision Dashboard. Electronically Signed   By: Marijo Sanes M.D.   On: 01/08/2017 12:49   Ct Abdomen Pelvis W Contrast  Result Date: 01/08/2017 CLINICAL DATA:  Stage IV lung cancer. EXAM: CT CHEST, ABDOMEN, AND PELVIS WITH CONTRAST TECHNIQUE: Multidetector CT imaging of the chest, abdomen and pelvis was performed following the standard protocol during bolus administration of intravenous contrast. CONTRAST:  152m ISOVUE-300 IOPAMIDOL (ISOVUE-300) INJECTION 61% COMPARISON:  PET-CT 10/31/2016 and chest CT 10/10/2016 FINDINGS: CT CHEST FINDINGS Cardiovascular: Very large pericardial effusion which measures 36 Hounsfield units suggesting complex fluid possibly due to pericardial metastatic disease. The heart is normal in size. No aortic dissection or aneurysm. The branch vessels are patent. Mediastinum/Nodes: No obvious mediastinal or hilar lymphadenopathy. The esophagus is grossly normal. Lungs/Pleura: Large right pleural effusion and small to moderate size left pleural effusion with overlying atelectasis. There is also persistent diffuse nodular interstitial thickening likely reflecting interstitial spread of tumor. Musculoskeletal: Possible sclerotic metastatic lesion involving the left aspect of T4. No other definite bone lesions. A few tiny sclerotic lesions are possibly bone islands. No breast masses, supraclavicular or axillary lymphadenopathy. Small scattered lymph nodes are noted. CT ABDOMEN PELVIS FINDINGS Hepatobiliary: No focal hepatic lesions to suggest metastatic disease. The gallbladder contains some high attenuation tear which could be sludge or stones. No common bile duct dilatation. Pancreas: No mass, inflammation or ductal dilatation. Spleen: Normal  size.  No focal lesions. Adrenals/Urinary Tract: The adrenal glands and kidneys are unremarkable. The bladder is unremarkable. Stomach/Bowel: The stomach, duodenum, small bowel and colon are grossly normal without oral contrast. No  inflammatory changes, mass lesions or obstructive findings. The terminal ileum and appendix are normal. Vascular/Lymphatic: Moderate distal aortic and proximal iliac artery calcifications but no aneurysm or dissection. The branch vessels are patent. The major venous structures are patent. Small scattered mesenteric and retroperitoneal lymph nodes but no mass or overt adenopathy. Reproductive: Surgically absent. Other: Small amount free pelvic fluid. No pelvic mass or adenopathy. No inguinal mass or adenopathy. Musculoskeletal: No definite findings for osseous metastatic disease. IMPRESSION: 1. Very large pericardial effusion, significantly increased since the PET-CT of October. 2. Large right and small to moderate left pleural effusions. 3. Nodular interstitial thickening in the lungs most consistent with interstitial spread of tumor and consistent with prior PET-CT findings. 4. No old abdominal/pelvic metastatic disease is identified. 5. Sclerotic metastatic focus involving the T4 vertebral body. This was also hot on the prior PET scan. These results will be called to the ordering clinician or representative by the Radiologist Assistant, and communication documented in the PACS or zVision Dashboard. Electronically Signed   By: Marijo Sanes M.D.   On: 01/08/2017 12:49   Dg Chest Port 1 View  Result Date: 01/17/2017 CLINICAL DATA:  Chest tube EXAM: PORTABLE CHEST 1 VIEW COMPARISON:  Yesterday FINDINGS: Right-sided chest tube in stable position. Trace right apical pneumothorax without increase. Right IJ line with tip at the upper right atrium. Pleural fluid and airspace opacity on the left where there is low volumes. Recent pericardial drain. Stable cardiopericardial enlargement. IMPRESSION: 1. Small right apical pneumothorax with stable chest tube positioning. 2. Recent pericardial drain.  Stable cardiopericardial size. 3. Unchanged pleural and parenchymal opacity on the left. Electronically Signed   By:  Monte Fantasia M.D.   On: 01/17/2017 07:00   Dg Chest Port 1 View  Result Date: 01/16/2017 CLINICAL DATA:  Chest tube EXAM: PORTABLE CHEST 1 VIEW COMPARISON:  Yesterday FINDINGS: Unchanged small right apical pneumothorax. Right IJ line with tip at the upper right atrium. Unchanged left pleural effusion and left lung opacity. Pericardial drain has been removed. Stable cardiopericardial enlargement. IMPRESSION: 1. Unchanged small right apical pneumothorax. 2. Stable cardiopericardial size after pericardial drain removal. 3. Left pleural effusion and lung opacity. Electronically Signed   By: Monte Fantasia M.D.   On: 01/16/2017 09:05   Dg Chest Port 1 View  Result Date: 01/15/2017 CLINICAL DATA:  Followup pleural effusion and chest tubes. EXAM: PORTABLE CHEST 1 VIEW COMPARISON:  01/14/2017 FINDINGS: Bilateral chest tubes remain in place. Right internal jugular central line tip at the SVC RA junction or proximal right atrium. Right pneumothorax is larger, 5-10% presently. No pneumothorax on the left. Slight worsening of infiltrate/volume loss at the right lung base. Persistent left pleural density with volume loss/ infiltrate in the left lower lung. IMPRESSION: Increase in right pneumothorax, now 5-10%. Slight worsening of infiltrate/volume loss at the right lung base. Persistent left pleural density with left lower lung infiltrate/collapse. Electronically Signed   By: Nelson Chimes M.D.   On: 01/15/2017 07:18   Dg Chest Port 1 View  Result Date: 01/14/2017 CLINICAL DATA:  Pleural effusion, short of breath EXAM: PORTABLE CHEST 1 VIEW COMPARISON:  01/13/2017 FINDINGS: Right chest tube remains in place. Tiny right apical pneumothorax unchanged. Pericardial drain unchanged position. Central venous catheter tip in the upper right atrium Dense consolidation of  the left lung base with associated left effusion unchanged. Improved aeration right lung base which is now nearly clear. IMPRESSION: Tiny right apical  pneumothorax unchanged. Dense consolidation left lower lobe with left effusion unchanged. Improved aeration right lung base. Overall improved lung volume since yesterday. Pericardial drain remains in place. Electronically Signed   By: Franchot Gallo M.D.   On: 01/14/2017 06:56   Dg Chest Port 1 View  Result Date: 01/13/2017 CLINICAL DATA:  Chest tubes in place. EXAM: PORTABLE CHEST 1 VIEW COMPARISON:  01/12/2017 FINDINGS: A right jugular catheter terminates at the level of the cavoatrial junction/ high right atrium. A right chest tube and pericardial drain remain in place. The cardiac silhouette remains enlarged. A left pleural effusion is stable to slightly larger than on the prior study. Left midlung airspace opacity has mildly increased, and there is also new mild right basilar airspace opacity. A trace right apical pneumothorax is similar to the prior study. IMPRESSION: 1. Unchanged trace right apical pneumothorax. 2. Stable to slightly increased size of left pleural effusion. 3. Worsening bilateral airspace disease. Electronically Signed   By: Logan Bores M.D.   On: 01/13/2017 09:19   Dg Chest Port 1 View  Result Date: 01/12/2017 CLINICAL DATA:  Pneumothorax EXAM: PORTABLE CHEST 1 VIEW COMPARISON:  Yesterday FINDINGS: Chest tube on the right in stable position. Trace right apical pneumothorax. Cardiopericardial enlargement with a pericardial drain. Left pleural effusion and asymmetric airspace opacity. Right IJ catheter with tip at the upper right atrium. IMPRESSION: 1. Trace right apical pneumothorax. Stable positioning of right chest tube and pericardial drain. 2. Stable cardiopericardial size. 3. Asymmetric left pleural effusion and airspace opacity. Electronically Signed   By: Monte Fantasia M.D.   On: 01/12/2017 08:11   Dg Chest Port 1 View  Result Date: 01/11/2017 CLINICAL DATA:  52 year old female Postoperative day 1 status post pericardial window and right chest tube placed. stage IV  lung cancer. Large pericardial effusion. Bilateral pleural effusions. EXAM: PORTABLE CHEST 1 VIEW COMPARISON:  01/10/2017 and earlier. FINDINGS: Portable AP semi upright view at 0654 hours. Stable right chest tube. Stable pericardial drain. Stable right IJ central line. Small medial right lung apex pneumothorax is now visible. No residual right pleural effusion is evident. Dense left lung base opacity persists obscuring the left hemidiaphragm. Patchy superimposed left mid lung opacity has progressed since 01/09/2017. Stable cardiac size and mediastinal contours. IMPRESSION: 1. Stable right chest tube. Small medial right apical pneumothorax is now visible. 2. Stable cardiac silhouette. Continued left lower lobe collapse or consolidation. 3. Increasing Patchy and confluent left perihilar opacity. 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 Electronically Signed   By: Genevie Ann M.D.   On: 01/11/2017 08:43   Dg Chest Port 1 View  Result Date: 01/11/2017 CLINICAL DATA:  Postoperative radiograph. Assess for pneumothorax. Patient has chest tubes in place. EXAM: PORTABLE CHEST 1 VIEW COMPARISON:  Chest radiograph performed 01/09/2017 FINDINGS: Bilateral chest tubes are noted. There is a residual small left pleural effusion. No significant pneumothorax is seen. Left perihilar airspace opacity may reflect atelectasis or possibly pneumonia. Underlying vascular congestion is noted. The cardiomediastinal silhouette is borderline enlarged. A right IJ line is noted ending about the distal SVC. No acute osseous abnormalities are seen. IMPRESSION: 1. Residual small left pleural effusion. No significant pneumothorax seen. 2. Vascular congestion and borderline cardiomegaly. Left perihilar airspace opacity may reflect atelectasis or possibly pneumonia. Electronically Signed   By: Garald Balding  M.D.   On: 01/11/2017 00:27     ASSESSMENT/PLAN:  Adenocarcinoma  of left lung, stage 4 ([place]) This is a very pleasant 52 year old African-American female with a stage IV (T3, N2, M1 a) non-small cell lung cancer, poorly differentiated adenocarcinoma with extensive miliary distribution in the lungs bilaterally diagnosed in September 2018. Unfortunately there was insufficient tissue material for molecular studies. 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  The patient is currently on treatment with Gilotrif and tolerating it well with the exception of mild diarrhea and facial rash.   The patient was seen with Dr. Julien Nordmann.  CT scan results were discussed with the patient and her sister.  Images were reviewed with them.  The patient's scans show improvement in the disease in her lung.  She is status post a pericardial window and right chest tube placement with talc pleurodesis. The patient will continue Gilotrif at the current dose.   Encouraged her to use Imodium every morning to try to prevent diarrhea. She may use additional doses as needed during the day.  For the rash on her face, she will continue clindamycin gel to be used twice a day.   The patient will be seen back in approximate 3 weeks for evaluation and repeat lab work. She was advised to call immediately if she has any concerning symptoms in the interval. The patient voices understanding of current disease status and treatment options and is in agreement with the current care plan.  Orders Placed This Encounter  Procedures  . CBC with Differential (Cancer Center Only)    Standing Status:   Future    Standing Expiration Date:   02/03/2018  . CMP ([place] only)    Standing Status:   Future    Standing Expiration Date:   02/03/2018    Mikey Bussing, DNP, AGPCNP-BC, AOCNP 02/04/17  ADDENDUM: Hematology/Oncology Attending: I had a face-to-face encounter with the patient.  I recommended her care plan.  This is a very pleasant 52 years old African-American  female with stage IV non-small cell lung cancer, adenocarcinoma with positive EGFR mutation in exon 19 diagnosed in September 2018.  The patient is currently on treatment with GILOTRIF 40 mg p.o. daily and has been tolerating this treatment well except for a few episodes of diarrhea and mild skin rash on the face. Recent imaging studies few weeks ago showed improvement of her disease in the lung but the patient had significant pericardial effusion status pericardial window and the fluid was positive for malignancy.  She also had right pleural effusion status post drainage.  She spent more than 1 week in the hospital for these procedure.  She is currently on a rehab facility and feeling much better and expected to go home today.  The patient is feeling much better today. I had a lengthy discussion with the patient and her family member about her current condition and treatment options.  I recommended for the patient to continue her current treatment with GILOTRIF at the same dose. For the diarrhea she was advised to use Imodium. We will arrange for the patient to come back for follow-up visit in 3 weeks for reevaluation and repeat blood work. For the hypokalemia, she will continue on K Dur. She was advised to call immediately if she has any concerning symptoms in the interval.  Disclaimer: This note was dictated with voice recognition software. Similar sounding words can inadvertently be transcribed and may be missed upon review.  Eilleen Kempf, MD 02/04/17

## 2017-02-04 ENCOUNTER — Encounter: Payer: Self-pay | Admitting: Oncology

## 2017-02-04 NOTE — Assessment & Plan Note (Signed)
This is a very pleasant 52 year old African-American female with a stage IV (T3, N2, M1 a) non-small cell lung cancer, poorly differentiated adenocarcinoma with extensive miliary distribution in the lungs bilaterally diagnosed in September 2018. Unfortunately there was insufficient tissue material for molecular studies. 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  The patient is currently on treatment with Gilotrif and tolerating it well with the exception of mild diarrhea and facial rash.   The patient was seen with Dr. Julien Nordmann.  CT scan results were discussed with the patient and her sister.  Images were reviewed with them.  The patient's scans show improvement in the disease in her lung.  She is status post a pericardial window and right chest tube placement with talc pleurodesis. The patient will continue Gilotrif at the current dose.   Encouraged her to use Imodium every morning to try to prevent diarrhea. She may use additional doses as needed during the day.  For the rash on her face, she will continue clindamycin gel to be used twice a day.   The patient will be seen back in approximate 3 weeks for evaluation and repeat lab work. She was advised to call immediately if she has any concerning symptoms in the interval. The patient voices understanding of current disease status and treatment options and is in agreement with the current care plan.

## 2017-02-05 ENCOUNTER — Telehealth: Payer: Self-pay | Admitting: Family Medicine

## 2017-02-05 NOTE — Telephone Encounter (Signed)
Copied from [place] [id number]. Topic: Quick Communication - See Telephone Encounter >> Feb 05, 2017  3:31 PM Robina Ade, Helene Kelp D wrote: CRM for notification. See Telephone encounter for: 02/05/17. Beverlee Nims from [facility] called and she would like verbal order for skill nurse visit a total of 7. If need more they will call back and also need OT & PT. She can reached at [phone].

## 2017-02-06 NOTE — Telephone Encounter (Signed)
Returned call from Beverlee Nims from [facility] to give verbal orders for skill nurse visit a total of 7 visits as requested. No answer, left voicemail with verbal approval.

## 2017-02-09 ENCOUNTER — Telehealth: Payer: Self-pay | Admitting: Family Medicine

## 2017-02-09 ENCOUNTER — Inpatient Hospital Stay: Payer: 59 | Admitting: Family Medicine

## 2017-02-09 NOTE — Telephone Encounter (Signed)
Called Tatiana PT with [facility] back to give verbal orders.

## 2017-02-09 NOTE — Telephone Encounter (Signed)
Copied from [place] [phone]. Topic: Quick Communication - See Telephone Encounter >> Feb 09, 2017 11:31 AM Ether Griffins B wrote: CRM for notification. See Telephone encounter for:  Dan Europe PT with [facility] calling needing verbal orders 1x for 1 week 2x for 4  weeks effective 02/07/17. Call back number [phone] ok to leave VM if no answer  02/09/17.

## 2017-02-10 NOTE — Progress Notes (Signed)
[facility] at [facility] [address] [place], [place] [address] [phone]  Date:  02/11/2017   Name:  LATEEFA CROSBY   DOB:  January 25, 1966   MRN:  [id number]  PCP:  Darreld Mclean, MD    Chief Complaint: Hospitalization Follow-up   History of Present Illness:  Nancy Moore is a 52 y.o. very pleasant female patient who presents with the following:  Here today for a follow-up visit Unfortunate history of advanced lung cancer- stage 4.  She was seen by oncology on 1/8:  Arfa M Haft 52 y.o. female returns for routine follow-up visit accompanied by her sister.  Patient was recently hospitalized for a very large pericardial effusion and right pleural effusion.  She underwent a pericardial window and had a right chest tube placed.  She also had talc pleurodesis performed.  Both the pericardial fluid and pleural fluid were positive for malignant cells.  The patient was discharged to a skilled facility for rehabilitation.  The patient is feeling better today.  She is due to be discharged from the skilled facility today back to home.  She is no longer wearing oxygen.  She denies fevers and chills.  Denies chest pain, shortness of breath, cough, hemoptysis.  She reports lower extremity edema and is currently on Lasix.  Denies nausea, vomiting, constipation.  She has intermittent diarrhea and uses Imodium as needed.  She has a mild rash to her face.  She continues to tolerate her Gilotrif fairly well.  She is here for evaluation and repeat blood work. The patient was seen with Dr. Julien Nordmann.  CT scan results were discussed with the patient and her sister.  Images were reviewed with them.  The patient's scans show improvement in the disease in her lung.  She is status post a pericardial window and right chest tube placement with talc pleurodesis. The patient will continue Gilotrif at the current dose.   She is back at her home now- left the rehab  facility about 10 days ago She feels "pretty good," she is trying to eat enough.  She got some Boost breeze which helps to supplement her diet Her breathing is "pretty good" but she will get tired with much activity. She is taking things slow and hoping to continue getting stronger The swelling and fluid leakage from her legs is resolved. She is keeping her legs up when she can Wt Readings from Last 3 Encounters:  02/11/17 202 lb (91.6 kg)  02/03/17 219 lb 6.4 oz (99.5 kg)  01/21/17 234 lb 9.1 oz (106.4 kg)   Her chest tube was removed prior to leaving the hospital- her operative sites are healing up well she thinks  She is on oxycodone 10 and needs 2 or 3 a day for pain- she does need me to refill this today She is off oxygen!   He will see Pres[facility] Gum on Friday for a recheck   She is taking lasix, K and magnesium once daily   NCCSR: most recent entries are not there, but nothing of concern  Patient Active Problem List   Diagnosis Date Noted  . Cardiac/pericardial tamponade   . Chest tube in place   . Acute lower UTI   . Pain   . Benign essential HTN   . Acute blood loss anemia   . Palliative care encounter   . AKI (acute kidney injury) ([place]) 01/10/2017  . Hypotension 01/09/2017  . Essential hypertension  01/09/2017  . Pleural effusion 01/09/2017  . Hypokalemia 01/09/2017  . Dyspnea 01/09/2017  . Leukocytosis 01/09/2017  . Pleural effusion on right 01/08/2017  . Pericardial effusion 01/08/2017  . Encounter for antineoplastic chemotherapy 11/20/2016  . Adenocarcinoma of left lung, stage 4 ([place]) 10/28/2016  . Goals of care, counseling/discussion 10/28/2016  . Upper airway cough syndrome 10/16/2016  . Multiple pulmonary nodules 10/15/2016    Past Medical History:  Diagnosis Date  . Adenocarcinoma of left lung, stage 4 ([place]) 10/28/2016  . Goals of care, counseling/discussion 10/28/2016  . Hypertension     Past Surgical History:  Procedure Laterality Date  . BREAST  EXCISIONAL BIOPSY Left 20+ yrs ago   benign  . PERICARDIAL WINDOW N/A 01/10/2017   Procedure: PERICARDIAL WINDOW- SUB XYPHOID, RIGHT CHEST TUBE;  Surgeon: Ivin Poot, MD;  Location: [facility];  Service: Thoracic;  Laterality: N/A;  . VIDEO BRONCHOSCOPY Bilateral 10/21/2016   Procedure: VIDEO BRONCHOSCOPY WITH FLUORO;  Surgeon: Tanda Rockers, MD;  Location: [facility];  Service: Cardiopulmonary;  Laterality: Bilateral;    Social History   Tobacco Use  . Smoking status: Never Smoker  . Smokeless tobacco: Never Used  Substance Use Topics  . Alcohol use: Yes    Comment: socially  . Drug use: No    Family History  Problem Relation Age of Onset  . Asthma Mother   . Stroke Mother   . Hypertension Mother   . Heart attack Father   . Hypertension Father   . Hyperlipidemia Father   . Dementia Father   . Emphysema Maternal Grandmother   . Hypertension Maternal Grandmother   . Colon cancer Paternal Grandmother     Allergies  Allergen Reactions  . Percocet [Oxycodone-Acetaminophen] Other (See Comments)    "makes me dizzy"  . Lisinopril Cough  . Losartan   . Valsartan Cough    Medication list has been reviewed and updated.  Current Outpatient Medications on File Prior to Visit  Medication Sig Dispense Refill  . afatinib dimaleate (GILOTRIF) 40 MG tablet Take 1 tablet (40 mg total) by mouth daily. Take on an empty stomach 1hr before or 2 hrs after meals. 30 tablet 1  . ammonium lactate (LAC-HYDRIN) 12 % lotion Apply topically as needed for dry skin. 400 g 0  . clindamycin (CLINDAGEL) 1 % gel Apply topically 2 (two) times daily. 30 g 1  . dextromethorphan-guaiFENesin (MUCINEX DM) 30-600 MG 12hr tablet Take 1 tablet by mouth 2 (two) times daily as needed for cough.    . furosemide (LASIX) 40 MG tablet Take 1 tablet (40 mg total) by mouth daily. 90 tablet 3  . loperamide (IMODIUM) 1 MG/5ML solution Take 2 mg as needed by mouth for diarrhea or loose stools.    . magnesium oxide  (MAG-OX) 400 (241.3 Mg) MG tablet Take 1 tablet (400 mg total) by mouth daily.    Marland Kitchen oxyCODONE 10 MG TABS Take 1 tablet (10 mg total) by mouth every 4 (four) hours as needed for moderate pain. 10 tablet 0  . pantoprazole (PROTONIX) 40 MG tablet Take 1 tablet (40 mg total) by mouth daily at 12 noon.    . potassium chloride SA (K-DUR,KLOR-CON) 20 MEQ tablet Take 1 tablet (20 mEq total) by mouth 2 (two) times daily.    . prochlorperazine (COMPAZINE) 10 MG tablet Take 1 tablet (10 mg total) every 6 (six) hours as needed by mouth for nausea or vomiting. 30 tablet 1  . senna-docusate (SENOKOT-S) 8.6-50 MG tablet  Take 1 tablet by mouth at bedtime.     No current facility-administered medications on file prior to visit.     Review of Systems:  As per HPI- otherwise negative.   Physical Examination: Vitals:   02/11/17 1435  BP: 118/77  Pulse: 91  Temp: 98.1 F (36.7 C)  SpO2: 100%   Vitals:   02/11/17 1435  Weight: 202 lb (91.6 kg)  Height: 5' 6"  (1.676 m)   Body mass index is 32.6 kg/m. Ideal Body Weight: Weight in (lb) to have BMI = 25: 154.6  ,GEN: WDWN, NAD, Non-toxic, A & O x 3, appearance of chronic illness but not acutely ill Here today with her dad and sister. She is no longer on oxygen HEENT: Atraumatic, Normocephalic. Neck supple. No masses, No LAD. Ears and Nose: No external deformity. CV: RRR, No M/G/R. No JVD. No thrill. No extra heart sounds. PULM: CTA B, no wheezes, crackles, rhonchi. No retractions. No resp. distress. No accessory muscle use. ABD: S, NT, ND. No rebound. No HSM. EXTR: No c/c/e NEURO Normal gait.  PSYCH: Normally interactive. Conversant. Not depressed or anxious appearing.  Calm demeanor.  Healing wounds on her abdomen from recent operation, look to be healing normally   Assessment and Plan: Adenocarcinoma of left lung, stage 4 (HCC) - Plan: Oxycodone HCl 10 MG TABS, DISCONTINUED: Oxycodone HCl 10 MG TABS  Chronic pain due to neoplasm  Here today  to follow-up from recent rehab stay She had labs done per oncology a week ago, CBC and CMP reviewed Refilled her oxycodone Wrote an rx for boost breeze as she wonders if she may be able to use her insurance benefits to cover part of this  Plan to visit in march, sooner if she needs anything   Signed Lamar Blinks, MD

## 2017-02-11 ENCOUNTER — Encounter: Payer: Self-pay | Admitting: Family Medicine

## 2017-02-11 ENCOUNTER — Ambulatory Visit: Payer: 59 | Admitting: Cardiothoracic Surgery

## 2017-02-11 ENCOUNTER — Ambulatory Visit (INDEPENDENT_AMBULATORY_CARE_PROVIDER_SITE_OTHER): Payer: 59 | Admitting: Family Medicine

## 2017-02-11 VITALS — BP 118/77 | HR 91 | Temp 98.1°F | Ht 66.0 in | Wt 202.0 lb

## 2017-02-11 DIAGNOSIS — G893 Neoplasm related pain (acute) (chronic): Secondary | ICD-10-CM

## 2017-02-11 DIAGNOSIS — C3492 Malignant neoplasm of unspecified part of left bronchus or lung: Secondary | ICD-10-CM | POA: Diagnosis not present

## 2017-02-11 MED ORDER — OXYCODONE HCL 10 MG PO TABS: 10.0000 mg | ORAL_TABLET | Freq: Three times a day (TID) | ORAL | 0 refills | Status: DC | PRN

## 2017-02-11 MED FILL — oxyCODONE HCL 10 MG TABS: 10 | 30 days supply | Qty: 90 | Fill #0

## 2017-02-11 NOTE — Patient Instructions (Signed)
It was good to see you today- take care and let me know when you need more pain medication.  I am glad to see you off your oxygen!  Continue to try and get protein in your diet- you body is using a lot of energy right now to fight your illness

## 2017-02-13 ENCOUNTER — Ambulatory Visit (INDEPENDENT_AMBULATORY_CARE_PROVIDER_SITE_OTHER): Payer: 59 | Admitting: Physician Assistant

## 2017-02-13 ENCOUNTER — Ambulatory Visit
Admission: RE | Admit: 2017-02-13 | Discharge: 2017-02-13 | Disposition: A | Discharge status: Home / Self Care | Payer: 59 | Source: Ambulatory Visit | Attending: Cardiothoracic Surgery | Admitting: Cardiothoracic Surgery

## 2017-02-13 ENCOUNTER — Other Ambulatory Visit: Payer: Self-pay

## 2017-02-13 ENCOUNTER — Other Ambulatory Visit: Payer: Self-pay | Admitting: Cardiothoracic Surgery

## 2017-02-13 VITALS — BP 125/80 | HR 90 | Resp 18 | Ht 66.0 in | Wt 201.2 lb

## 2017-02-13 DIAGNOSIS — J90 Pleural effusion, not elsewhere classified: Secondary | ICD-10-CM

## 2017-02-13 DIAGNOSIS — I3139 Other pericardial effusion (noninflammatory): Secondary | ICD-10-CM

## 2017-02-13 DIAGNOSIS — I313 Pericardial effusion (noninflammatory): Secondary | ICD-10-CM

## 2017-02-13 NOTE — Progress Notes (Signed)
Nancy Moore is a 52 y.o. female patient status post subxiphoid pericardial window on 01/10/2017 and a drainage of a right pleural effusion.  She received a talc slurry in her right chest tube on 01/19/2017.  She was denied inpatient rehab admission, therefore she was discharged to a skilled nursing facility.  The fluid from her pericardial window came back malignant.    1. Pericardial effusion    Past Medical History:  Diagnosis Date  . Adenocarcinoma of left lung, stage 4 ([place]) 10/28/2016  . Goals of care, counseling/discussion 10/28/2016  . Hypertension    No past surgical history pertinent negatives on file. Scheduled Meds: Current Outpatient Medications on File Prior to Visit  Medication Sig Dispense Refill  . afatinib dimaleate (GILOTRIF) 40 MG tablet Take 1 tablet (40 mg total) by mouth daily. Take on an empty stomach 1hr before or 2 hrs after meals. 30 tablet 1  . ammonium lactate (LAC-HYDRIN) 12 % lotion Apply topically as needed for dry skin. 400 g 0  . clindamycin (CLINDAGEL) 1 % gel Apply topically 2 (two) times daily. 30 g 1  . dextromethorphan-guaiFENesin (MUCINEX DM) 30-600 MG 12hr tablet Take 1 tablet by mouth 2 (two) times daily as needed for cough.    . furosemide (LASIX) 40 MG tablet Take 1 tablet (40 mg total) by mouth daily. 90 tablet 3  . loperamide (IMODIUM) 1 MG/5ML solution Take 2 mg as needed by mouth for diarrhea or loose stools.    . magnesium oxide (MAG-OX) 400 (241.3 Mg) MG tablet Take 1 tablet (400 mg total) by mouth daily.    . Oxycodone HCl 10 MG TABS Take 1 tablet (10 mg total) by mouth 3 (three) times daily as needed. 90 tablet 0  . pantoprazole (PROTONIX) 40 MG tablet Take 1 tablet (40 mg total) by mouth daily at 12 noon.    . potassium chloride SA (K-DUR,KLOR-CON) 20 MEQ tablet Take 1 tablet (20 mEq total) by mouth 2 (two) times daily.    . prochlorperazine (COMPAZINE) 10 MG tablet Take 1 tablet (10 mg total) every 6 (six) hours as needed by mouth for  nausea or vomiting. 30 tablet 1  . senna-docusate (SENOKOT-S) 8.6-50 MG tablet Take 1 tablet by mouth at bedtime.     No current facility-administered medications on file prior to visit.     Allergies  Allergen Reactions  . Percocet [Oxycodone-Acetaminophen] Other (See Comments)    "makes me dizzy"  . Lisinopril Cough  . Losartan   . Valsartan Cough   Active Problems:   * No active hospital problems. *  Height 5\' 6"  (1.676 m), last menstrual period 03/15/2010.  Subjective Ms. Lamartina presents for her 4-week follow-up appointment status post subxiphoid pericardial window and right chest tube placement on 01/10/2017.  Overall she is doing quite well.  She completed her time at the skilled nursing facility and now is doing home physical therapy a few times a week.  She feels she is getting less short of breath with activity.  She does still have some anxiety about reaccumulating fluid around her heart and in her lung space.  Her chest x-ray was reviewed with her and her daughter today at the bedside.  Objective  Cor: RRR, no murmur Pulm: CTA bilaterally, some diminished breath sounds in the lower lobes Abd: no tenderness Ext: No edema Wound: I peeling off some eschar and glue there was a tender spot mid incision which bled when I cleaned the wound.  The chest tube  site was clean and dry with a layer of eschar.   CLINICAL DATA:  Pleural effusion.  EXAM: CHEST  2 VIEW  COMPARISON:  01/22/2017.  FINDINGS: Mediastinum hilar structures normal. Heart size stable. Diffuse bilateral interstitial prominence again noted. Associated bilateral pleural effusions again noted. These findings consistent CHF. Interim partial clearing from prior exam. Mild left mid lung field subsegmental atelectasis. Previously identified pneumothorax on the right has resolved.  IMPRESSION: 1.  Previously identified right pneumothorax has resolved.  2. Congestive heart failure pulmonary interstitial  edema bilateral pleural effusions. Interim partial clearing from prior exam.  3.  Mild left mid lung field subsegmental atelectasis.   Electronically Signed   By: Marcello Moores  Register   On: 02/13/2017 10:20  Assessment & Plan  [name] presents today for her routine 4-week follow-up visit status post pericardial window.  She has had some tenderness around her incision and occasional pressure.  She does still have some shortness of breath with activity.  She does notice a difference with her physical therapy regimen and feels stronger and more independent.  She has been taking a limited amount of pain medication.  She has an appointment with her oncology team at the end of the month to discuss her current medications.  We reviewed her chest x-ray today at the bedside and it looks markedly improved from when she was in the hospital.  She does have some small bilateral pleural effusions.  Her right pneumothorax that was visualized on the previous x-ray has resolved.  She does have some mild left atelectasis.  I have also emailed Gay Filler to try and schedule follow-up with Dr. Debara Pickett who saw her while she was in the hospital.  I reviewed symptoms of pericardial tamponade not at which include tachycardia, hypotension, severe chest pressure and pain, and severe shortness of breath.  She is aware that if she experiences any of these symptoms that she is to call 911 and report to the emergency department as soon as possible.  She does still have some anxiety around accumulating fluid.  I instructed her to continue her Lasix 40 mg daily.  She appeared euvolemic on exam today. She will follow-up with Dr. Lysbeth Penner office and oncology in the near future.  Per the patient, she is scheduled for a CT scan in the next few weeks. She will follow-up with our office as needed. I informed her to call our office if she has any questions or concerns in the future.  Elgie Collard 02/13/2017

## 2017-02-13 NOTE — Patient Instructions (Addendum)
  If you do not hear from Dr. Lysbeth Penner office in a few days please give them a call and arrange an appointment.  Please follow-up with your oncology team at your scheduled appointment at the end of the month.  Continue to care for your incisions.   Pixie Casino, MD, [facility], [place] Director of the Advanced Lipid Disorders &  Cardiovascular Risk Reduction Clinic Attending Cardiologist  Direct Dial: [phone]  Fax: [phone]  Website:  [email or web address]

## 2017-02-18 ENCOUNTER — Encounter: Payer: Self-pay | Admitting: General Practice

## 2017-02-18 ENCOUNTER — Telehealth: Payer: Self-pay | Admitting: Family Medicine

## 2017-02-18 ENCOUNTER — Other Ambulatory Visit: Payer: Self-pay | Admitting: Medical Oncology

## 2017-02-18 ENCOUNTER — Telehealth: Payer: Self-pay | Admitting: Medical Oncology

## 2017-02-18 DIAGNOSIS — C3492 Malignant neoplasm of unspecified part of left bronchus or lung: Secondary | ICD-10-CM

## 2017-02-18 MED ORDER — AFATINIB DIMALEATE 40 MG PO TABS: 40.0000 mg | ORAL_TABLET | Freq: Every day | ORAL | 2 refills | Status: DC

## 2017-02-18 NOTE — Progress Notes (Signed)
[facility] CSW Progress Notes  Call to patient to inform her of availability of samples of Ensure from dietitian as well as request of desk nurse to contact home health re discontinuing oxygen.  Patient requests samples be picked up at her next appt w oncologist on 1/29.  Edwyna Shell, LCSW Clinical Social Worker Phone:  [phone]

## 2017-02-18 NOTE — Telephone Encounter (Signed)
Received message that pt wants to discontinue her oxygen at home. She has worn oxygen since dec 24th. Her lowest oxygen sat on room air she reports is 92%. She is doing her breathing exercises - blowing put candles , smell flowers.

## 2017-02-18 NOTE — Telephone Encounter (Signed)
Copied from [place]. Topic: Quick Communication - See Telephone Encounter >> Feb 18, 2017  4:25 PM Bea Graff, NT wrote: CRM for notification. See Telephone encounter for: Pt states she noticed on her after visit summary that under allergies it states she allergic to Valsartan due to "cough." Pt says she is not on Valsartan because it has been known to give people cancer.   02/18/17.

## 2017-02-18 NOTE — Telephone Encounter (Signed)
Per Julien Nordmann it is okay to d/c oxygen.

## 2017-02-20 ENCOUNTER — Encounter: Payer: Self-pay | Admitting: *Deleted

## 2017-02-24 ENCOUNTER — Telehealth: Payer: Self-pay | Admitting: Internal Medicine

## 2017-02-24 ENCOUNTER — Other Ambulatory Visit: Payer: Self-pay | Admitting: Oncology

## 2017-02-24 ENCOUNTER — Ambulatory Visit: Payer: 59 | Admitting: Internal Medicine

## 2017-02-24 ENCOUNTER — Encounter: Payer: Self-pay | Admitting: Internal Medicine

## 2017-02-24 ENCOUNTER — Other Ambulatory Visit: Payer: Self-pay | Admitting: *Deleted

## 2017-02-24 ENCOUNTER — Encounter: Payer: Self-pay | Admitting: Oncology

## 2017-02-24 ENCOUNTER — Inpatient Hospital Stay: Payer: 59 | Admitting: Oncology

## 2017-02-24 ENCOUNTER — Inpatient Hospital Stay: Payer: 59 | Admitting: *Deleted

## 2017-02-24 ENCOUNTER — Inpatient Hospital Stay: Payer: 59

## 2017-02-24 VITALS — BP 119/62 | HR 80 | Temp 97.6°F | Resp 18 | Ht 66.0 in | Wt 201.8 lb

## 2017-02-24 VITALS — BP 108/74 | HR 88 | Ht 66.0 in | Wt 201.4 lb

## 2017-02-24 DIAGNOSIS — Z23 Encounter for immunization: Secondary | ICD-10-CM

## 2017-02-24 DIAGNOSIS — I318 Other specified diseases of pericardium: Secondary | ICD-10-CM

## 2017-02-24 DIAGNOSIS — C3492 Malignant neoplasm of unspecified part of left bronchus or lung: Secondary | ICD-10-CM

## 2017-02-24 DIAGNOSIS — C801 Malignant (primary) neoplasm, unspecified: Secondary | ICD-10-CM

## 2017-02-24 DIAGNOSIS — R21 Rash and other nonspecific skin eruption: Secondary | ICD-10-CM | POA: Diagnosis not present

## 2017-02-24 DIAGNOSIS — I3131 Malignant pericardial effusion in diseases classified elsewhere: Secondary | ICD-10-CM

## 2017-02-24 DIAGNOSIS — R197 Diarrhea, unspecified: Secondary | ICD-10-CM | POA: Diagnosis not present

## 2017-02-24 DIAGNOSIS — J90 Pleural effusion, not elsewhere classified: Secondary | ICD-10-CM | POA: Diagnosis not present

## 2017-02-24 DIAGNOSIS — C23 Malignant neoplasm of gallbladder: Secondary | ICD-10-CM

## 2017-02-24 DIAGNOSIS — Z5111 Encounter for antineoplastic chemotherapy: Secondary | ICD-10-CM

## 2017-02-24 DIAGNOSIS — I10 Essential (primary) hypertension: Secondary | ICD-10-CM | POA: Diagnosis not present

## 2017-02-24 DIAGNOSIS — I313 Pericardial effusion (noninflammatory): Secondary | ICD-10-CM

## 2017-02-24 LAB — CMP (CANCER CENTER ONLY)
ALT: 42 U/L (ref 0–55)
AST: 32 U/L (ref 5–34)
Albumin: 3.5 g/dL (ref 3.5–5.0)
Alkaline Phosphatase: 76 U/L (ref 40–150)
Anion gap: 9 (ref 3–11)
BUN: 10 mg/dL (ref 7–26)
CO2: 32 mmol/L — ABNORMAL HIGH (ref 22–29)
Calcium: 9.5 mg/dL (ref 8.4–10.4)
Chloride: 102 mmol/L (ref 98–109)
Creatinine: 0.81 mg/dL (ref 0.60–1.10)
GFR, Est AFR Am: >60 mL/min (ref >60)
GFR, Estimated: >60 mL/min (ref >60)
Glucose, Bld: 84 mg/dL (ref 70–140)
Potassium: 3.9 mmol/L (ref 3.3–4.7)
Sodium: 143 mmol/L (ref 136–145)
Total Bilirubin: 0.5 mg/dL (ref 0.2–1.2)
Total Protein: 7.2 g/dL (ref 6.4–8.3)

## 2017-02-24 LAB — CBC WITH DIFFERENTIAL (CANCER CENTER ONLY)
Basophils Absolute: 0 10*3/uL (ref 0.0–0.1)
Basophils Relative: 0 %
Eosinophils Absolute: 0.3 10*3/uL (ref 0.0–0.5)
Eosinophils Relative: 3 %
HCT: 35.6 % (ref 34.8–46.6)
Hemoglobin: 10.8 g/dL — ABNORMAL LOW (ref 11.6–15.9)
Lymphocytes Relative: 24 %
Lymphs Abs: 2.5 10*3/uL (ref 0.9–3.3)
MCH: 25.6 pg (ref 25.1–34.0)
MCHC: 30.3 g/dL — ABNORMAL LOW (ref 31.5–36.0)
MCV: 84.4 fL (ref 79.5–101.0)
Monocytes Absolute: 0.8 10*3/uL (ref 0.1–0.9)
Monocytes Relative: 8 %
Neutro Abs: 6.8 10*3/uL — ABNORMAL HIGH (ref 1.5–6.5)
Neutrophils Relative %: 65 %
Platelet Count: 244 10*3/uL (ref 145–400)
RBC: 4.22 MIL/uL (ref 3.70–5.45)
RDW: 19.2 % — ABNORMAL HIGH (ref 11.2–16.1)
WBC Count: 10.4 10*3/uL — ABNORMAL HIGH (ref 3.9–10.3)

## 2017-02-24 MED ORDER — INFLUENZA VAC SPLIT QUAD 0.5 ML IM SUSY
0.5000 mL | PREFILLED_SYRINGE | Freq: Once | INTRAMUSCULAR | Status: AC
  Administered 2017-02-24: 0.5 mL via INTRAMUSCULAR
  Filled 2017-02-24: qty 0.5

## 2017-02-24 NOTE — Assessment & Plan Note (Signed)
This is a very pleasant 52 year old African-American female with a stage IV (T3, N2, M1 a) non-small cell lung cancer, poorly differentiated adenocarcinoma with extensive miliary distribution in the lungs bilaterally diagnosed in September 2018. Unfortunately there was insufficient tissue material for molecular studies. 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  The patient is currently on treatment with Gilotrif for the past 3 months and tolerating it well with the exception of mild diarrhea and facial rash. Her rash and diarrhea have continued to improve. Recommend that she continue her Gilotrif at the current dose.  Encouraged her to use Imodium as needed for her diarrhea if it develops. For the rash on her face,she will continueclindamycin gel as needed.   The patient will have a CT of the chest, abdomen, pelvis for restaging prior to her next visit.  We will see her back for follow-up in approximately 3 weeks to discuss the results.  A flu vaccine was administered today per the patient request.  She was advised to call immediately if she has any concerning symptoms in the interval. The patient voices understanding of current disease status and treatment options and is in agreement with the current care plan.

## 2017-02-24 NOTE — Telephone Encounter (Signed)
Gave avs and calendar for February

## 2017-02-24 NOTE — Patient Instructions (Signed)
Your physician wants you to follow-up in: ONE YEAR with Dr. Hilty. You will receive a reminder letter in the mail two months in advance. If you don't receive a letter, please call our office to schedule the follow-up appointment.  

## 2017-02-24 NOTE — Progress Notes (Signed)
[facility] OFFICE PROGRESS NOTE  Copland, Gay Filler, MD [address] [place] [address]  DIAGNOSIS: stage IV (T3, N2, M1 a) non-small cell lung cancer, poorly differentiated adenocarcinoma with extensive miliary distribution in the lungs bilaterally diagnosed in September 2018. 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 NEGATIVE for the Exon 20 T790M mutation  PRIOR THERAPY: None  CURRENT THERAPY: Gilotrif 40 mg daily started 11/08/2016.  Status post 3 months of treatment.  INTERVAL HISTORY: Nancy Moore 52 y.o. female returns for routine follow-up visit accompanied by her sister.  The patient is feeling fine today has no specific complaints.  The rash to her face has improved and she is using clindamycin gel only as needed.  Her diarrhea is much improved as well.  She uses Imodium only as needed.  Her lower extremity edema has improved.  She denies fevers and chills.  Denies chest pain, shortness of breath, hemoptysis.  She has an intermittent dry cough which has improved from prior report.  Denies nausea, vomiting, constipation.  She denies any recent weight loss.  She is trying to eat more and gain back some of her lost weight.  She continues to tolerate her Gilotrif fairly well.  The patient is here for evaluation and repeat lab work.  MEDICAL HISTORY: Past Medical History:  Diagnosis Date  . Adenocarcinoma of left lung, stage 4 ([facility]) 10/28/2016  . Goals of care, counseling/discussion 10/28/2016  . Hypertension     ALLERGIES:  is allergic to percocet [oxycodone-acetaminophen]; lisinopril; and losartan.  MEDICATIONS:  Current Outpatient Medications  Medication Sig Dispense Refill  . afatinib dimaleate (GILOTRIF) 40 MG tablet Take 1 tablet (40 mg total) by mouth daily. Take on an empty stomach 1hr before or 2 hrs after meals. 30 tablet 2  . ammonium lactate (LAC-HYDRIN) 12 % lotion Apply topically as needed for dry skin. 400 g 0  . clindamycin  (CLINDAGEL) 1 % gel Apply topically 2 (two) times daily. 30 g 1  . furosemide (LASIX) 40 MG tablet Take 1 tablet (40 mg total) by mouth daily. 90 tablet 3  . magnesium oxide (MAG-OX) 400 (241.3 Mg) MG tablet Take 1 tablet (400 mg total) by mouth daily.    . pantoprazole (PROTONIX) 40 MG tablet Take 1 tablet (40 mg total) by mouth daily at 12 noon.    . potassium chloride SA (K-DUR,KLOR-CON) 20 MEQ tablet Take 1 tablet (20 mEq total) by mouth 2 (two) times daily.    . prochlorperazine (COMPAZINE) 10 MG tablet Take 1 tablet (10 mg total) every 6 (six) hours as needed by mouth for nausea or vomiting. 30 tablet 1  . dextromethorphan-guaiFENesin (MUCINEX DM) 30-600 MG 12hr tablet Take 1 tablet by mouth 2 (two) times daily as needed for cough. (Patient not taking: Reported on 02/24/2017)    . loperamide (IMODIUM) 1 MG/5ML solution Take 2 mg as needed by mouth for diarrhea or loose stools.    . Oxycodone HCl 10 MG TABS Take 1 tablet (10 mg total) by mouth 3 (three) times daily as needed. (Patient not taking: Reported on 02/24/2017) 90 tablet 0  . senna-docusate (SENOKOT-S) 8.6-50 MG tablet Take 1 tablet by mouth at bedtime. (Patient not taking: Reported on 02/24/2017)     No current facility-administered medications for this visit.     SURGICAL HISTORY:  Past Surgical History:  Procedure Laterality Date  . BREAST EXCISIONAL BIOPSY Left 20+ yrs ago   benign  . PERICARDIAL WINDOW  N/A 01/10/2017   Procedure: PERICARDIAL WINDOW- SUB XYPHOID, RIGHT CHEST TUBE;  Surgeon: Ivin Poot, MD;  Location: [facility];  Service: Thoracic;  Laterality: N/A;  . VIDEO BRONCHOSCOPY Bilateral 10/21/2016   Procedure: VIDEO BRONCHOSCOPY WITH FLUORO;  Surgeon: Tanda Rockers, MD;  Location: [facility];  Service: Cardiopulmonary;  Laterality: Bilateral;    REVIEW OF SYSTEMS:   Review of Systems  Constitutional: Negative for appetite change, chills, fatigue, fever and unexpected weight change.  HENT:   Negative for mouth  sores, nosebleeds, sore throat and trouble swallowing.   Eyes: Negative for eye problems and icterus.  Respiratory: Negative for hemoptysis, shortness of breath and wheezing.  Positive for intermittent dry cough. Cardiovascular: Negative for chest pain and leg swelling.  Gastrointestinal: Negative for abdominal pain, constipation, diarrhea, nausea and vomiting.  Genitourinary: Negative for bladder incontinence, difficulty urinating, dysuria, frequency and hematuria.   Musculoskeletal: Negative for back pain, gait problem, neck pain and neck stiffness.  Skin: Negative for itching and rash.  Neurological: Negative for dizziness, extremity weakness, gait problem, headaches, light-headedness and seizures.  Hematological: Negative for adenopathy. Does not bruise/bleed easily.  Psychiatric/Behavioral: Negative for confusion, depression and sleep disturbance. The patient is not nervous/anxious.     PHYSICAL EXAMINATION:  Blood pressure 119/62, pulse 80, temperature 97.6 F (36.4 C), resp. rate 18, height 5' 6"  (1.676 m), weight 201 lb 12.8 oz (91.5 kg), last menstrual period 03/15/2010, SpO2 98 %.  ECOG PERFORMANCE STATUS: 1 - Symptomatic but completely ambulatory  Physical Exam  Constitutional: Oriented to person, place, and time and well-developed, well-nourished, and in no distress. No distress.  HENT:  Head: Normocephalic and atraumatic.  Mouth/Throat: Oropharynx is clear and moist. No oropharyngeal exudate.  Eyes: Conjunctivae are normal. Right eye exhibits no discharge. Left eye exhibits no discharge. No scleral icterus.  Neck: Normal range of motion. Neck supple.  Cardiovascular: Normal rate, regular rhythm, normal heart sounds and intact distal pulses.   Pulmonary/Chest: Effort normal and breath sounds normal. No respiratory distress. No wheezes. No rales.  Abdominal: Soft. Bowel sounds are normal. Exhibits no distension and no mass. There is no tenderness.  Musculoskeletal: Normal  range of motion. Exhibits no edema.  Lymphadenopathy:    No cervical adenopathy.  Neurological: Alert and oriented to person, place, and time. Exhibits normal muscle tone. Gait normal. Coordination normal.  Skin: Skin is warm and dry. No rash noted. Not diaphoretic. No erythema. No pallor.  Psychiatric: Mood, memory and judgment normal.  Vitals reviewed.  LABORATORY DATA: Lab Results  Component Value Date   WBC 10.4 (H) 02/24/2017   HGB 9.0 (L) 01/21/2017   HCT 35.6 02/24/2017   MCV 84.4 02/24/2017   PLT 244 02/24/2017      Chemistry      Component Value Date/Time   NA 143 02/24/2017 1424   NA 135 (L) 01/08/2017 1055   K 3.9 02/24/2017 1424   K 3.2 (L) 01/08/2017 1055   CL 102 02/24/2017 1424   CO2 32 (H) 02/24/2017 1424   CO2 25 01/08/2017 1055   BUN 10 02/24/2017 1424   BUN 14.6 01/08/2017 1055   CREATININE 0.87 01/21/2017 0317   CREATININE 1.3 (H) 01/08/2017 1055      Component Value Date/Time   CALCIUM 9.5 02/24/2017 1424   CALCIUM 9.5 01/08/2017 1055   ALKPHOS 76 02/24/2017 1424   ALKPHOS 82 01/08/2017 1055   AST 32 02/24/2017 1424   AST 31 01/08/2017 1055   ALT 42 02/24/2017 1424  ALT 34 01/08/2017 1055   BILITOT 0.5 02/24/2017 1424   BILITOT 1.74 (H) 01/08/2017 1055       RADIOGRAPHIC STUDIES:  Dg Chest 2 View  Result Date: 02/13/2017 CLINICAL DATA:  Pleural effusion. EXAM: CHEST  2 VIEW COMPARISON:  01/22/2017. FINDINGS: Mediastinum hilar structures normal. Heart size stable. Diffuse bilateral interstitial prominence again noted. Associated bilateral pleural effusions again noted. These findings consistent CHF. Interim partial clearing from prior exam. Mild left mid lung field subsegmental atelectasis. Previously identified pneumothorax on the right has resolved. IMPRESSION: 1.  Previously identified right pneumothorax has resolved. 2. Congestive heart failure pulmonary interstitial edema bilateral pleural effusions. Interim partial clearing from prior  exam. 3.  Mild left mid lung field subsegmental atelectasis. Electronically Signed   By: Marcello Moores  Register   On: 02/13/2017 10:20     ASSESSMENT/PLAN:  Adenocarcinoma of left lung, stage 4 ([facility]) This is a very pleasant 52 year old African-American female with a stage IV (T3, N2, M1 a) non-small cell lung cancer, poorly differentiated adenocarcinoma with extensive miliary distribution in the lungs bilaterally diagnosed in September 2018. Unfortunately there was insufficient tissue material for molecular studies. 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  The patient is currently on treatment with Gilotrif for the past 3 months and tolerating it well with the exception of mild diarrhea and facial rash. Her rash and diarrhea have continued to improve. Recommend that she continue her Gilotrif at the current dose.  Encouraged her to use Imodium as needed for her diarrhea if it develops. For the rash on her face,she will continueclindamycin gel as needed.   The patient will have a CT of the chest, abdomen, pelvis for restaging prior to her next visit.  We will see her back for follow-up in approximately 3 weeks to discuss the results.  A flu vaccine was administered today per the patient request.  She was advised to call immediately if she has any concerning symptoms in the interval. The patient voices understanding of current disease status and treatment options and is in agreement with the current care plan.  Orders Placed This Encounter  Procedures  . CT CHEST W CONTRAST    Standing Status:   Future    Standing Expiration Date:   02/24/2018    Order Specific Question:   If indicated for the ordered procedure, I authorize the administration of contrast media per Radiology protocol    Answer:   Yes    Order Specific Question:   Preferred imaging location?    Answer:   [facility]    Order Specific Question:   Radiology Contrast Protocol - do  NOT remove file path    Answer:   \\[email or web address]    Order Specific Question:   Reason for Exam additional comments    Answer:   Lung cancer. Restaging.    Order Specific Question:   Is patient pregnant?    Answer:   No  . CT ABDOMEN PELVIS W CONTRAST    Standing Status:   Future    Standing Expiration Date:   02/24/2018    Order Specific Question:   If indicated for the ordered procedure, I authorize the administration of contrast media per Radiology protocol    Answer:   Yes    Order Specific Question:   Preferred imaging location?    Answer:   [facility]    Order Specific Question:   Radiology Contrast Protocol - do NOT  remove file path    Answer:   \\[email or web address]    Order Specific Question:   Reason for Exam additional comments    Answer:   Lung cancer. Restaging.    Order Specific Question:   Is patient pregnant?    Answer:   No  . CBC with Differential (Cancer Center Only)    Standing Status:   Future    Standing Expiration Date:   02/24/2018  . CMP ([facility] only)    Standing Status:   Future    Standing Expiration Date:   02/24/2018    Mikey Bussing, DNP, AGPCNP-BC, AOCNP 02/24/17

## 2017-02-25 ENCOUNTER — Encounter: Payer: Self-pay | Admitting: Internal Medicine

## 2017-02-25 NOTE — Progress Notes (Signed)
OFFICE CONSULT NOTE  Chief Complaint:  Follow-up hospitalization  Primary Care Physician: Copland, Gay Filler, MD  HPI:  Nancy Moore is a 52 y.o. female who is being seen today for the evaluation of shortness of breath at the request of Copland, Gay Filler, MD. This is an unfortunate 52 yo female patient of Dr. Julien Moore with stage 4 adenocarcinoma of the lung and history of hypertension. She recently had a re-staging CT scan of her chest which showed a large, complex pericardial effusion as well as a large right pleural effusion. An urgent referral to cardiology was made for today and she was also ordered to have a thoracentesis as an outpatient. In the office today, she reports marked shortness of breath (she is on [place] oxygen) with exertion, chest heaviness and significant leg edema. She said she has had weeping through the skin behind her knees. I shared the CT scan results with her today, however, she seemed surprised by the findings and says she wasn't told the results, although it was documented that they were discussed with her this morning.   02/25/2017  Nancy Moore returns today for hospital follow-up.  She was found to have a large pericardial effusion with tamponade physiology was sent urgently from the office to the emergency department.  She was admitted and underwent evaluation by CT surgery and ultimately underwent pericardial window.  She required thoracentesis and placement of a chest tube as well as talc pleurodesis.  Subsequent to that she reports some improvement in her breathing.  Unfortunately she does have stage IV cancer and was found to have a metastatic pericardial effusion.  According to the patient she is responding somewhat to chemotherapy.  PMHx:  Past Medical History:  Diagnosis Date  . Adenocarcinoma of left lung, stage 4 ([facility]) 10/28/2016  . Goals of care, counseling/discussion 10/28/2016  . Hypertension     Past Surgical History:  Procedure Laterality Date  .  BREAST EXCISIONAL BIOPSY Left 20+ yrs ago   benign  . PERICARDIAL WINDOW N/A 01/10/2017   Procedure: PERICARDIAL WINDOW- SUB XYPHOID, RIGHT CHEST TUBE;  Surgeon: Ivin Poot, MD;  Location: [facility];  Service: Thoracic;  Laterality: N/A;  . VIDEO BRONCHOSCOPY Bilateral 10/21/2016   Procedure: VIDEO BRONCHOSCOPY WITH FLUORO;  Surgeon: Tanda Rockers, MD;  [facility];  Service: Cardiopulmonary;  Laterality: Bilateral;    FAMHx:  Family History  Problem Relation Age of Onset  . Asthma Mother   . Stroke Mother   . Hypertension Mother   . Heart attack Father   . Hypertension Father   . Hyperlipidemia Father   . Dementia Father   . Emphysema Maternal Grandmother   . Hypertension Maternal Grandmother   . Colon cancer Paternal Grandmother     SOCHx:   reports that  has never smoked. she has never used smokeless tobacco. She reports that she drinks alcohol. She reports that she does not use drugs.  ALLERGIES:  Allergies  Allergen Reactions  . Percocet [Oxycodone-Acetaminophen] Other (See Comments)    "makes me dizzy"  . Lisinopril Cough  . Losartan     ROS: Pertinent items noted in HPI and remainder of comprehensive ROS otherwise negative.  HOME MEDS: Current Outpatient Medications on File Prior to Visit  Medication Sig Dispense Refill  . afatinib dimaleate (GILOTRIF) 40 MG tablet Take 1 tablet (40 mg total) by mouth daily. Take on an empty stomach 1hr before or 2 hrs after meals. 30 tablet 2  . ammonium lactate (  LAC-HYDRIN) 12 % lotion Apply topically as needed for dry skin. 400 g 0  . clindamycin (CLINDAGEL) 1 % gel Apply topically 2 (two) times daily. 30 g 1  . dextromethorphan-guaiFENesin (MUCINEX DM) 30-600 MG 12hr tablet Take 1 tablet by mouth 2 (two) times daily as needed for cough. (Patient not taking: Reported on 02/24/2017)    . furosemide (LASIX) 40 MG tablet Take 1 tablet (40 mg total) by mouth daily. 90 tablet 3  . loperamide (IMODIUM) 1 MG/5ML solution  Take 2 mg as needed by mouth for diarrhea or loose stools.    . magnesium oxide (MAG-OX) 400 (241.3 Mg) MG tablet Take 1 tablet (400 mg total) by mouth daily.    . Oxycodone HCl 10 MG TABS Take 1 tablet (10 mg total) by mouth 3 (three) times daily as needed. (Patient not taking: Reported on 02/24/2017) 90 tablet 0  . pantoprazole (PROTONIX) 40 MG tablet Take 1 tablet (40 mg total) by mouth daily at 12 noon.    . potassium chloride SA (K-DUR,KLOR-CON) 20 MEQ tablet Take 1 tablet (20 mEq total) by mouth 2 (two) times daily.    . prochlorperazine (COMPAZINE) 10 MG tablet Take 1 tablet (10 mg total) every 6 (six) hours as needed by mouth for nausea or vomiting. 30 tablet 1  . senna-docusate (SENOKOT-S) 8.6-50 MG tablet Take 1 tablet by mouth at bedtime. (Patient not taking: Reported on 02/24/2017)     No current facility-administered medications on file prior to visit.     LABS/IMAGING: No results found for this or any previous visit (from the past 48 hour(s)). No results found.  LIPID PANEL: No results found for: CHOL, TRIG, HDL, CHOLHDL, VLDL, LDLCALC, LDLDIRECT  WEIGHTS: Wt Readings from Last 3 Encounters:  02/24/17 201 lb 12.8 oz (91.5 kg)  02/24/17 201 lb 6.4 oz (91.4 kg)  02/13/17 201 lb 3.2 oz (91.3 kg)    VITALS: BP 108/74   Pulse 88   Ht 5' 6"  (1.676 m)   Wt 201 lb 6.4 oz (91.4 kg)   LMP 03/15/2010   BMI 32.51 kg/m   EXAM: General appearance: alert, mild distress, moderately obese and on nasal cannula oxygen, coughing Neck: JVD - 5 cm above sternal notch, no carotid bruit and thyroid not enlarged, symmetric, no tenderness/mass/nodules Lungs: diminished breath sounds bilaterally and dullness to percussion RLL, RML and RUL Heart: regular rate and rhythm Abdomen: obese, soft, non-tender Extremities: edema 3+ edema to mid-thigh bilaterally Pulses: 2+ and symmetric Skin: Skin color, texture, turgor normal. No rashes or lesions Neurologic: Mental status: Awake, somewhat slow  of thought Psych: Pleasant  EKG: Normal sinus rhythm at 88, nonspecific T wave changes-personally reviewed  ASSESSMENT: 1. Large malignant pericardial effusion with tamponade - s/p pericardial window 2. Large right pleural effusion - s/p chest tube drainage and talc pleurodesis 3. Stage IV adenocarcinoma of the lung  4. Hypotension  PLAN: 1.   Mrs. Havard did very well after draining of a malignant pericardial effusion with tamponade and a large right pleural effusion.  She required talc pleurodesis and unfortunately has stage IV adenocarcinoma of the lung.  She seems to be responding to chemotherapy though.  I did not expect recurrent pericardial effusion given her pericardial window.  This could be monitored with routine CT scans which will likely be performed to follow-up the disease progression of her lung cancer.  Should there be an increase in pericardial fluid, I am happy to see her back for repeat echocardiogram.  Follow-up  with me in 1 year or sooner as needed.  Thanks for allowing me to participate in her care.  Pixie Casino, MD, [facility], [place] Director of the Advanced Lipid Disorders &  Cardiovascular Risk Reduction Clinic Attending Cardiologist  Direct Dial: [phone]  Fax: [phone]  Website:  [email or web address] 02/25/2017, 5:36 PM

## 2017-02-27 ENCOUNTER — Telehealth: Payer: Self-pay | Admitting: *Deleted

## 2017-02-27 NOTE — Telephone Encounter (Signed)
Received Home Health Certification and Plan of Care; forwarded to provider/SLS 02/01 

## 2017-03-03 LAB — RESEARCH LABS

## 2017-03-04 ENCOUNTER — Other Ambulatory Visit: Payer: Self-pay | Admitting: Oncology

## 2017-03-04 ENCOUNTER — Encounter: Payer: Self-pay | Admitting: *Deleted

## 2017-03-04 DIAGNOSIS — C3492 Malignant neoplasm of unspecified part of left bronchus or lung: Secondary | ICD-10-CM

## 2017-03-05 ENCOUNTER — Telehealth: Payer: Self-pay | Admitting: Family Medicine

## 2017-03-05 NOTE — Telephone Encounter (Signed)
Copied from [place]. Topic: Quick Communication - See Telephone Encounter >> Mar 05, 2017  4:41 PM Ivar Drape wrote: CRM for notification. See Telephone encounter for:  03/05/17.  Patient would like the following prescriptions refilled:  1) potassium chloride SA (K-DUR,KLOR-CON) 20 MEQ tablet 2) pantoprazole (PROTONIX) 40 MG tablet  3) furosemide (LASIX) 40 MG tablet   These medications were written by [facility]. The patient is out of all of them, so she would like for Dr. Janett Billow Copland to refill them.  Her preferred pharmacy is: [facility]

## 2017-03-06 ENCOUNTER — Other Ambulatory Visit: Payer: Self-pay | Admitting: Emergency Medicine

## 2017-03-06 MED ORDER — POTASSIUM CHLORIDE CRYS ER 20 MEQ PO TBCR: 20.0000 meq | EXTENDED_RELEASE_TABLET | Freq: Two times a day (BID) | ORAL | 1 refills | Status: DC

## 2017-03-06 MED ORDER — FUROSEMIDE 40 MG PO TABS: 40.0000 mg | ORAL_TABLET | Freq: Every day | ORAL | 3 refills | Status: DC

## 2017-03-06 MED ORDER — PANTOPRAZOLE SODIUM 40 MG PO TBEC: 40.0000 mg | DELAYED_RELEASE_TABLET | Freq: Every day | ORAL | 1 refills | Status: DC

## 2017-03-06 NOTE — Telephone Encounter (Signed)
Medication refills have been sent per pt request.

## 2017-03-16 ENCOUNTER — Telehealth: Payer: Self-pay | Admitting: *Deleted

## 2017-03-16 NOTE — Telephone Encounter (Signed)
Received FMLA/STD, Concurrent Disability and Leave, paperwork from [place]; completed provider information, need clarification, no OV note in the time frame requested; forwarded to provider/SLS 02/18

## 2017-03-17 ENCOUNTER — Encounter: Payer: Self-pay | Admitting: *Deleted

## 2017-03-17 ENCOUNTER — Telehealth: Payer: Self-pay | Admitting: Internal Medicine

## 2017-03-17 ENCOUNTER — Encounter (HOSPITAL_COMMUNITY): Payer: Self-pay

## 2017-03-17 ENCOUNTER — Inpatient Hospital Stay: Payer: 59 | Attending: Oncology

## 2017-03-17 ENCOUNTER — Ambulatory Visit (HOSPITAL_COMMUNITY)
Admission: RE | Admit: 2017-03-17 | Discharge: 2017-03-17 | Disposition: A | Discharge status: Home / Self Care | Payer: 59 | Source: Ambulatory Visit | Attending: Oncology | Admitting: Oncology

## 2017-03-17 DIAGNOSIS — R197 Diarrhea, unspecified: Secondary | ICD-10-CM | POA: Diagnosis not present

## 2017-03-17 DIAGNOSIS — I313 Pericardial effusion (noninflammatory): Secondary | ICD-10-CM | POA: Insufficient documentation

## 2017-03-17 DIAGNOSIS — I700 Atherosclerosis of aorta: Secondary | ICD-10-CM | POA: Diagnosis not present

## 2017-03-17 DIAGNOSIS — C3492 Malignant neoplasm of unspecified part of left bronchus or lung: Secondary | ICD-10-CM | POA: Diagnosis present

## 2017-03-17 DIAGNOSIS — J90 Pleural effusion, not elsewhere classified: Secondary | ICD-10-CM | POA: Insufficient documentation

## 2017-03-17 DIAGNOSIS — K5900 Constipation, unspecified: Secondary | ICD-10-CM | POA: Insufficient documentation

## 2017-03-17 LAB — CBC WITH DIFFERENTIAL (CANCER CENTER ONLY)
Basophils Absolute: 0.1 10*3/uL (ref 0.0–0.1)
Basophils Relative: 1 %
Eosinophils Absolute: 0.2 10*3/uL (ref 0.0–0.5)
Eosinophils Relative: 3 %
HCT: 35.2 % (ref 34.8–46.6)
Hemoglobin: 11.1 g/dL — ABNORMAL LOW (ref 11.6–15.9)
Lymphocytes Relative: 26 %
Lymphs Abs: 1.8 10*3/uL (ref 0.9–3.3)
MCH: 26.4 pg (ref 25.1–34.0)
MCHC: 31.6 g/dL (ref 31.5–36.0)
MCV: 83.6 fL (ref 79.5–101.0)
Monocytes Absolute: 0.6 10*3/uL (ref 0.1–0.9)
Monocytes Relative: 8 %
Neutro Abs: 4.5 10*3/uL (ref 1.5–6.5)
Neutrophils Relative %: 62 %
Platelet Count: 237 10*3/uL (ref 145–400)
RBC: 4.21 MIL/uL (ref 3.70–5.45)
RDW: 21 % — ABNORMAL HIGH (ref 11.2–14.5)
WBC Count: 7.2 10*3/uL (ref 3.9–10.3)

## 2017-03-17 LAB — CMP (CANCER CENTER ONLY)
ALT: 16 U/L (ref 0–55)
AST: 20 U/L (ref 5–34)
Albumin: 3.7 g/dL (ref 3.5–5.0)
Alkaline Phosphatase: 88 U/L (ref 40–150)
Anion gap: 7 (ref 3–11)
BUN: 12 mg/dL (ref 7–26)
CO2: 30 mmol/L — ABNORMAL HIGH (ref 22–29)
Calcium: 9.7 mg/dL (ref 8.4–10.4)
Chloride: 102 mmol/L (ref 98–109)
Creatinine: 0.94 mg/dL (ref 0.60–1.10)
GFR, Est AFR Am: >60 mL/min (ref >60)
GFR, Estimated: >60 mL/min (ref >60)
Glucose, Bld: 90 mg/dL (ref 70–140)
Potassium: 4.2 mmol/L (ref 3.5–5.1)
Sodium: 139 mmol/L (ref 136–145)
Total Bilirubin: 0.5 mg/dL (ref 0.2–1.2)
Total Protein: 7.2 g/dL (ref 6.4–8.3)

## 2017-03-17 MED ORDER — IOPAMIDOL (ISOVUE-300) INJECTION 61%
100.0000 mL | Freq: Once | INTRAVENOUS | Status: AC | PRN
  Administered 2017-03-17: 100 mL via INTRAVENOUS

## 2017-03-17 MED ORDER — IOPAMIDOL (ISOVUE-300) INJECTION 61%
INTRAVENOUS | Status: AC
  Filled 2017-03-17: qty 100

## 2017-03-17 NOTE — Telephone Encounter (Signed)
03/17/17 @ 11:55 am met with patient and gave her a copy of her Disability paperwork and also successfully faxed a copy to [facility] @ [phone] @ 6:47 am this morning.

## 2017-03-17 NOTE — Progress Notes (Signed)
[place] Work  Holiday representative met with patient at [facility] to review and complete Cancer Care application.  Completed application and documentation were submitted.  Patient encouraged to call CSW with questions or concerns.    Johnnye Lana, MSW, LCSW, OSW-C Clinical Social Worker [facility] [phone]

## 2017-03-18 ENCOUNTER — Inpatient Hospital Stay (HOSPITAL_BASED_OUTPATIENT_CLINIC_OR_DEPARTMENT_OTHER): Payer: 59 | Admitting: Oncology

## 2017-03-18 ENCOUNTER — Encounter: Payer: Self-pay | Admitting: Oncology

## 2017-03-18 ENCOUNTER — Telehealth: Payer: Self-pay | Admitting: Oncology

## 2017-03-18 ENCOUNTER — Encounter: Payer: Self-pay | Admitting: Internal Medicine

## 2017-03-18 VITALS — BP 130/73 | HR 72 | Temp 97.5°F | Resp 15 | Wt 201.4 lb

## 2017-03-18 DIAGNOSIS — C3492 Malignant neoplasm of unspecified part of left bronchus or lung: Secondary | ICD-10-CM

## 2017-03-18 DIAGNOSIS — R197 Diarrhea, unspecified: Secondary | ICD-10-CM | POA: Diagnosis not present

## 2017-03-18 DIAGNOSIS — Z5111 Encounter for antineoplastic chemotherapy: Secondary | ICD-10-CM

## 2017-03-18 NOTE — Telephone Encounter (Signed)
Scheduled appt per 2/20 los - Gave patient AVS and calender per los.

## 2017-03-18 NOTE — Progress Notes (Signed)
[facility] OFFICE PROGRESS NOTE  Copland, Gay Filler, MD [address] [place] [address]  DIAGNOSIS: stage IV (T3, N2, M1 a) non-small cell lung cancer, poorly differentiated adenocarcinoma with extensive miliary distribution in the lungs bilaterally diagnosed in September 2018. 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 NEGATIVE for the Exon 20 T790M mutation  PRIOR THERAPY: None.  CURRENT THERAPY: Gilotrif 40 mg daily started 11/08/2016.  Status post 4 months of treatment.  INTERVAL HISTORY: Nancy Moore 52 y.o. female returns for routine follow-up visit accompanied by her sister.  Patient is feeling fine today and has no specific complaints.  The patient denies fevers and chills.  Denies chest pain, shortness of breath, cough, hemoptysis.  Denies nausea, vomiting, constipation, diarrhea.  She denies any recent weight loss or night sweats.  She denies rashes.  The patient continues to tolerate her treatment with Gilotrif fairly well.  The patient is here for evaluation and repeat lab work and to discuss her recent restaging CT scan results.  MEDICAL HISTORY: Past Medical History:  Diagnosis Date  . Adenocarcinoma of left lung, stage 4 ([facility]) 10/28/2016  . Goals of care, counseling/discussion 10/28/2016  . Hypertension     ALLERGIES:  is allergic to percocet [oxycodone-acetaminophen]; lisinopril; and losartan.  MEDICATIONS:  Current Outpatient Medications  Medication Sig Dispense Refill  . afatinib dimaleate (GILOTRIF) 40 MG tablet Take 1 tablet (40 mg total) by mouth daily. Take on an empty stomach 1hr before or 2 hrs after meals. 30 tablet 2  . ammonium lactate (LAC-HYDRIN) 12 % lotion Apply topically as needed for dry skin. 400 g 0  . clindamycin (CLINDAGEL) 1 % gel Apply topically 2 (two) times daily. 30 g 1  . dextromethorphan-guaiFENesin (MUCINEX DM) 30-600 MG 12hr tablet Take 1 tablet by mouth 2 (two) times daily as needed for cough. (Patient not  taking: Reported on 02/24/2017)    . furosemide (LASIX) 40 MG tablet Take 1 tablet (40 mg total) by mouth daily. 90 tablet 3  . loperamide (IMODIUM) 1 MG/5ML solution Take 2 mg as needed by mouth for diarrhea or loose stools.    . magnesium oxide (MAG-OX) 400 (241.3 Mg) MG tablet Take 1 tablet (400 mg total) by mouth daily.    . Oxycodone HCl 10 MG TABS Take 1 tablet (10 mg total) by mouth 3 (three) times daily as needed. (Patient not taking: Reported on 02/24/2017) 90 tablet 0  . pantoprazole (PROTONIX) 40 MG tablet Take 1 tablet (40 mg total) by mouth daily at 12 noon. 90 tablet 1  . potassium chloride SA (K-DUR,KLOR-CON) 20 MEQ tablet Take 1 tablet (20 mEq total) by mouth 2 (two) times daily. 180 tablet 1  . prochlorperazine (COMPAZINE) 10 MG tablet Take 1 tablet (10 mg total) every 6 (six) hours as needed by mouth for nausea or vomiting. 30 tablet 1  . senna-docusate (SENOKOT-S) 8.6-50 MG tablet Take 1 tablet by mouth at bedtime. (Patient not taking: Reported on 02/24/2017)     No current facility-administered medications for this visit.     SURGICAL HISTORY:  Past Surgical History:  Procedure Laterality Date  . BREAST EXCISIONAL BIOPSY Left 20+ yrs ago   benign  . PERICARDIAL WINDOW N/A 01/10/2017   Procedure: PERICARDIAL WINDOW- SUB XYPHOID, RIGHT CHEST TUBE;  Surgeon: Ivin Poot, MD;  Location: [facility];  Service: Thoracic;  Laterality: N/A;  . VIDEO BRONCHOSCOPY Bilateral 10/21/2016   Procedure: VIDEO BRONCHOSCOPY WITH FLUORO;  Surgeon:  Tanda Rockers, MD;  Location: [facility];  Service: Cardiopulmonary;  Laterality: Bilateral;    REVIEW OF SYSTEMS:   Review of Systems  Constitutional: Negative for appetite change, chills, fatigue, fever and unexpected weight change.  HENT:   Negative for mouth sores, nosebleeds, sore throat and trouble swallowing.   Eyes: Negative for eye problems and icterus.  Respiratory: Negative for cough, hemoptysis, shortness of breath and wheezing.    Cardiovascular: Negative for chest pain and leg swelling.  Gastrointestinal: Negative for abdominal pain, constipation, diarrhea, nausea and vomiting.  Genitourinary: Negative for bladder incontinence, difficulty urinating, dysuria, frequency and hematuria.   Musculoskeletal: Negative for back pain, gait problem, neck pain and neck stiffness.  Skin: Negative for itching and rash.  Neurological: Negative for dizziness, extremity weakness, gait problem, headaches, light-headedness and seizures.  Hematological: Negative for adenopathy. Does not bruise/bleed easily.  Psychiatric/Behavioral: Negative for confusion, depression and sleep disturbance. The patient is not nervous/anxious.     PHYSICAL EXAMINATION:  Blood pressure 130/73, pulse 72, temperature (!) 97.5 F (36.4 C), temperature source Oral, resp. rate 15, weight 201 lb 6.4 oz (91.4 kg), last menstrual period 03/15/2010, SpO2 99 %.  ECOG PERFORMANCE STATUS: 1 - Symptomatic but completely ambulatory  Physical Exam  Constitutional: Oriented to person, place, and time and well-developed, well-nourished, and in no distress. No distress.  HENT:  Head: Normocephalic and atraumatic.  Mouth/Throat: Oropharynx is clear and moist. No oropharyngeal exudate.  Eyes: Conjunctivae are normal. Right eye exhibits no discharge. Left eye exhibits no discharge. No scleral icterus.  Neck: Normal range of motion. Neck supple.  Cardiovascular: Normal rate, regular rhythm, normal heart sounds and intact distal pulses.   Pulmonary/Chest: Effort normal and breath sounds normal. No respiratory distress. No wheezes. No rales.  Abdominal: Soft. Bowel sounds are normal. Exhibits no distension and no mass. There is no tenderness.  Musculoskeletal: Normal range of motion. Exhibits no edema.  Lymphadenopathy:    No cervical adenopathy.  Neurological: Alert and oriented to person, place, and time. Exhibits normal muscle tone. Gait normal. Coordination normal.   Skin: Skin is warm and dry. No rash noted. Not diaphoretic. No erythema. No pallor.  Psychiatric: Mood, memory and judgment normal.  Vitals reviewed.  LABORATORY DATA: Lab Results  Component Value Date   WBC 7.2 03/17/2017   HGB 9.0 (L) 01/21/2017   HCT 35.2 03/17/2017   MCV 83.6 03/17/2017   PLT 237 03/17/2017  03/17/2017 hemoglobin 11.1 hematocrit 35.2.    Chemistry      Component Value Date/Time   NA 139 03/17/2017 1150   NA 135 (L) 01/08/2017 1055   K 4.2 03/17/2017 1150   K 3.2 (L) 01/08/2017 1055   CL 102 03/17/2017 1150   CO2 30 (H) 03/17/2017 1150   CO2 25 01/08/2017 1055   BUN 12 03/17/2017 1150   BUN 14.6 01/08/2017 1055   CREATININE 0.94 03/17/2017 1150   CREATININE 1.3 (H) 01/08/2017 1055      Component Value Date/Time   CALCIUM 9.7 03/17/2017 1150   CALCIUM 9.5 01/08/2017 1055   ALKPHOS 88 03/17/2017 1150   ALKPHOS 82 01/08/2017 1055   AST 20 03/17/2017 1150   AST 31 01/08/2017 1055   ALT 16 03/17/2017 1150   ALT 34 01/08/2017 1055   BILITOT 0.5 03/17/2017 1150   BILITOT 1.74 (H) 01/08/2017 1055       RADIOGRAPHIC STUDIES:  Ct Chest W Contrast  Result Date: 03/17/2017 CLINICAL DATA:  Metastatic left lung cancer. Oral chemotherapy.  Prior talc pleurodesis December 2018. Mid upper abdominal pain. EXAM: CT CHEST AND ABDOMEN WITH CONTRAST TECHNIQUE: Multidetector CT imaging of the chest and abdomen was performed following the standard protocol during bolus administration of intravenous contrast. CONTRAST:  159m ISOVUE-300 IOPAMIDOL (ISOVUE-300) INJECTION 61% COMPARISON:  01/08/2017 FINDINGS: CT CHEST FINDINGS Cardiovascular: Moderate to large pericardial effusion, although considerably improved compared to 01/08/2017. Mediastinum/Nodes: Indistinct AP window lymph node 0.7 cm in short axis, previously 1.0 cm. Lungs/Pleura: Small bilateral pleural effusions, significantly improved from the prior exam. There is some bandlike density along the right posterior  pleural space likely a manifestation of prior pleurodesis. Bilateral interstitial and nodular opacities in the lungs with random distribution of nodules and patchy ground-glass opacities observed. The airway thickening shown on the prior exam is improved. There is some continued bandlike density in the superior segment left lower lobe tension from atelectasis or tumor. There has been some interval clearing of the airspace opacity previously present posteriorly in the left upper lobe. Musculoskeletal: Overall stable size and appearance of the sclerotic lesion eccentric to the left in the T3 vertebral body. No new thoracic metastatic lesion is identified. CT ABDOMEN FINDINGS Hepatobiliary: Unremarkable Pancreas: Unremarkable Spleen: Unremarkable Adrenals/Urinary Tract: Unremarkable Stomach/Bowel: Prominent stool throughout the colon favors constipation. Vascular/Lymphatic: Aortoiliac atherosclerotic vascular disease. A lymph node along the root of the mesentery measures 0.8 cm in short axis on image 76/2, formerly 1.0 cm. No current pathologically enlarged adenopathy. Other: No supplemental non-categorized findings. Musculoskeletal: Unremarkable IMPRESSION: 1. Improved although still notable pericardial effusion. 2. Improved bilateral pleural effusions, with evidence of prior right talc pleurodesis. 3. Similar appearance of scattered miliary nodularity in the lungs with a primarily random distribution. There is some associated interstitial accentuation and regions of ground-glass opacity. This was previously hypermetabolic and suspicious for lymphangitic spread of tumor. There has been some improvement in the airspace opacity in the left upper lobe compared to the prior exam. 4. Stable appearance of the sclerotic lesion in the left T3 vertebral body compatible with metastatic lesion. 5. With respect to the patient's mid to upper abdominal pain, the only specific correlating finding on today's exam is prominence of  stool throughout the colon favoring constipation. 6. Aortic Atherosclerosis (ICD10-I70.0). Electronically Signed   By: WVan ClinesM.D.   On: 03/17/2017 15:48   Ct Abdomen W Contrast  Result Date: 03/17/2017 CLINICAL DATA:  Metastatic left lung cancer. Oral chemotherapy. Prior talc pleurodesis December 2018. Mid upper abdominal pain. EXAM: CT CHEST AND ABDOMEN WITH CONTRAST TECHNIQUE: Multidetector CT imaging of the chest and abdomen was performed following the standard protocol during bolus administration of intravenous contrast. CONTRAST:  1051mISOVUE-300 IOPAMIDOL (ISOVUE-300) INJECTION 61% COMPARISON:  01/08/2017 FINDINGS: CT CHEST FINDINGS Cardiovascular: Moderate to large pericardial effusion, although considerably improved compared to 01/08/2017. Mediastinum/Nodes: Indistinct AP window lymph node 0.7 cm in short axis, previously 1.0 cm. Lungs/Pleura: Small bilateral pleural effusions, significantly improved from the prior exam. There is some bandlike density along the right posterior pleural space likely a manifestation of prior pleurodesis. Bilateral interstitial and nodular opacities in the lungs with random distribution of nodules and patchy ground-glass opacities observed. The airway thickening shown on the prior exam is improved. There is some continued bandlike density in the superior segment left lower lobe tension from atelectasis or tumor. There has been some interval clearing of the airspace opacity previously present posteriorly in the left upper lobe. Musculoskeletal: Overall stable size and appearance of the sclerotic lesion eccentric to the left in the  T3 vertebral body. No new thoracic metastatic lesion is identified. CT ABDOMEN FINDINGS Hepatobiliary: Unremarkable Pancreas: Unremarkable Spleen: Unremarkable Adrenals/Urinary Tract: Unremarkable Stomach/Bowel: Prominent stool throughout the colon favors constipation. Vascular/Lymphatic: Aortoiliac atherosclerotic vascular disease. A  lymph node along the root of the mesentery measures 0.8 cm in short axis on image 76/2, formerly 1.0 cm. No current pathologically enlarged adenopathy. Other: No supplemental non-categorized findings. Musculoskeletal: Unremarkable IMPRESSION: 1. Improved although still notable pericardial effusion. 2. Improved bilateral pleural effusions, with evidence of prior right talc pleurodesis. 3. Similar appearance of scattered miliary nodularity in the lungs with a primarily random distribution. There is some associated interstitial accentuation and regions of ground-glass opacity. This was previously hypermetabolic and suspicious for lymphangitic spread of tumor. There has been some improvement in the airspace opacity in the left upper lobe compared to the prior exam. 4. Stable appearance of the sclerotic lesion in the left T3 vertebral body compatible with metastatic lesion. 5. With respect to the patient's mid to upper abdominal pain, the only specific correlating finding on today's exam is prominence of stool throughout the colon favoring constipation. 6. Aortic Atherosclerosis (ICD10-I70.0). Electronically Signed   By: Van Clines M.D.   On: 03/17/2017 15:48     ASSESSMENT/PLAN:  Adenocarcinoma of left lung, stage 4 ([facility]) This is a very pleasant 52 year old African-American female with a stage IV (T3, N2, M1 a) non-small cell lung cancer, poorly differentiated adenocarcinoma with extensive miliary distribution in the lungs bilaterally diagnosed in September 2018. Unfortunately there was insufficient tissue material for molecular studies. 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  The patient is currently on treatment with Gilotrif for the past 4 months and tolerating it well. She had recent restaging CT scans and is here to discuss the results.  The patient was seen with Dr. Julien Nordmann.  CT scan results were discussed and images were shown to the patient and her  sister.  CT scan shows continued improvement of her lung cancer.  Recommend that she continue her current treatment with Gilotrif 40 mg daily. We will plan to bring the patient back in a possibly 1 month for evaluation and repeat lab work.  Plan is for restaging CT scans every 3 months.  Encouraged her to use Imodium as needed for her diarrhea if it develops. She has clindamycin gel to use as needed for rash if it develops.   She was advised to call immediately if she has any concerning symptoms in the interval. The patient voices understanding of current disease status and treatment options and is in agreement with the current care plan.  Orders Placed This Encounter  Procedures  . CBC with Differential (Cancer Center Only)    Standing Status:   Future    Standing Expiration Date:   03/18/2018  . CMP ([facility] only)    Standing Status:   Future    Standing Expiration Date:   03/18/2018    Mikey Bussing, DNP, AGPCNP-BC, AOCNP 03/18/17  ADDENDUM: Hematology/Oncology Attending: I had a face-to-face encounter with the patient today.  I recommended her care plan.  This is a very pleasant 52 years old African-American female diagnosed with a stage IV non-small cell lung cancer, adenocarcinoma with positive EGFR mutation in exon 19, presented with extensive miliary distribution and pulmonary nodules in the lungs bilaterally diagnosed in September 2018. The patient was a started on treatment with GILOTRIF status post 4 months of treatment.  She is feeling much better  and she is not currently on home oxygen.  She also had pericardial effusion 2 months ago status post pericardial window. The patient is feeling much better today.  She is tolerating her treatment with GILOTRIF with no concerning complaints except for mild skin rash and few episodes of diarrhea. She had repeat CT scan of the chest, abdomen and pelvis performed recently.  I personally and independently reviewed the scan images  and discussed the results and showed the images to the patient and her family member today. Her scan showed continuous improvement of her disease. I recommended for the patient to continue her current treatment with GILOTRIF. I will see her back for follow-up visit in 1 month for evaluation with repeat CBC and comprehensive metabolic panel. She was advised to call immediately if she has any concerning symptoms in the interval.  Disclaimer: This note was dictated with voice recognition software. Similar sounding words can inadvertently be transcribed and may be missed upon review. Eilleen Kempf, MD 03/18/17

## 2017-03-18 NOTE — Assessment & Plan Note (Signed)
This is a very pleasant 52 year old African-American female with a stage IV (T3, N2, M1 a) non-small cell lung cancer, poorly differentiated adenocarcinoma with extensive miliary distribution in the lungs bilaterally diagnosed in September 2018. Unfortunately there was insufficient tissue material for molecular studies. 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  The patient is currently on treatment with Gilotrif for the past 4 months and tolerating it well. She had recent restaging CT scans and is here to discuss the results.  The patient was seen with Dr. Julien Nordmann.  CT scan results were discussed and images were shown to the patient and her sister.  CT scan shows continued improvement of her lung cancer.  Recommend that she continue her current treatment with Gilotrif 40 mg daily. We will plan to bring the patient back in a possibly 1 month for evaluation and repeat lab work.  Plan is for restaging CT scans every 3 months.  Encouraged her to use Imodium as needed for her diarrhea if it develops. She has clindamycin gel to use as needed for rash if it develops.   She was advised to call immediately if she has any concerning symptoms in the interval. The patient voices understanding of current disease status and treatment options and is in agreement with the current care plan.

## 2017-03-18 NOTE — Progress Notes (Signed)
Met with patient to discuss [employer].  Patient approved for one-time $400 grant with oral chemo approved by [employer]. Patient has a copy of approval letter as well as the expense sheet along with the outpatient pharmacy information. Patient has my card for any additional financial questions or concerns.

## 2017-03-28 NOTE — Progress Notes (Signed)
[place] at [facility] [address] [place], [place] [address] [phone]  Date:  04/01/2017   Name:  Nancy Moore   DOB:  [birth year]   MRN:  [id number]  PCP:  Darreld Mclean, MD    Chief Complaint: Follow-up (Pt here for follow up. )   History of Present Illness:  Nancy Moore is a 52 y.o. very pleasant female patient who presents with the following:  Following up today- stage 4 lung cancer  From oncology note 2/20:  This is a very pleasant 52 years old African-American female diagnosed with a stage IV non-small cell lung cancer, adenocarcinoma with positive EGFR mutation in exon 19, presented with extensive miliary distribution and pulmonary nodules in the lungs bilaterally diagnosed in September 2018. The patient was a started on treatment with GILOTRIF status post 4 months of treatment.  She is feeling much better and she is not currently on home oxygen.  She also had pericardial effusion 2 months ago status post pericardial window. The patient is feeling much better today.  She is tolerating her treatment with GILOTRIF with no concerning complaints except for mild skin rash and few episodes of diarrhea. She had repeat CT scan of the chest, abdomen and pelvis performed recently.  I personally and independently reviewed the scan images and discussed the results and showed the images to the patient and her family member today. Her scan showed continuous improvement of her disease. I recommended for the patient to continue her current treatment with GILOTRIF. I will see her back for follow-up visit in march  We are thrilled to see that she is responding so well to treatment  She will have labs and visit with oncology in March She will get a CT every 3 months She is feeling good- she looks much better today, like "a new woman" She notes a bit of tightness in her right trapezius muscle this am which she thinks may have come  from sleeping wrong. She tends to have issues with her trapezius muscles and used to do some massage for this   She is not swelling in her feet or legs Her weight is about the same- weighed with her shoes on today   She is not back at work as of yet.  We had not thought that this would be possible.  However ,she is making such great strides that she might end up going back eventually   She is using oxycodone at night for pain from her talk procedure, but would like some robaxin to use during the day as the oxycodone makes her sleepy  Wt Readings from Last 3 Encounters:  04/01/17 203 lb 12.8 oz (92.4 kg)  03/18/17 201 lb 6.4 oz (91.4 kg)  02/24/17 201 lb 12.8 oz (91.5 kg)    Patient Active Problem List   Diagnosis Date Noted  . Cardiac/pericardial tamponade   . Chest tube in place   . Acute lower UTI   . Pain   . Acute blood loss anemia   . Palliative care encounter   . AKI (acute kidney injury) ([place]) 01/10/2017  . Hypotension 01/09/2017  . Essential hypertension 01/09/2017  . Pleural effusion 01/09/2017  . Hypokalemia 01/09/2017  . Dyspnea 01/09/2017  . Leukocytosis 01/09/2017  . Pleural effusion on right 01/08/2017  . Encounter for antineoplastic chemotherapy 11/20/2016  . Adenocarcinoma of left lung, stage 4 ([place] [facility]) 10/28/2016  . Goals of care, counseling/discussion 10/28/2016  .  Upper airway cough syndrome 10/16/2016  . Multiple pulmonary nodules 10/15/2016    Past Medical History:  Diagnosis Date  . Adenocarcinoma of left lung, stage 4 ([place]) 10/28/2016  . Goals of care, counseling/discussion 10/28/2016  . Hypertension     Past Surgical History:  Procedure Laterality Date  . BREAST EXCISIONAL BIOPSY Left 20+ yrs ago   benign  . PERICARDIAL WINDOW N/A 01/10/2017   Procedure: PERICARDIAL WINDOW- SUB XYPHOID, RIGHT CHEST TUBE;  Surgeon: Ivin Poot, MD;  Location: [facility];  Service: Thoracic;  Laterality: N/A;  . VIDEO BRONCHOSCOPY Bilateral 10/21/2016   Procedure:  VIDEO BRONCHOSCOPY WITH FLUORO;  Surgeon: Tanda Rockers, MD;  Location: [facility];  Service: Cardiopulmonary;  Laterality: Bilateral;    Social History   Tobacco Use  . Smoking status: Never Smoker  . Smokeless tobacco: Never Used  Substance Use Topics  . Alcohol use: Yes    Comment: socially  . Drug use: No    Family History  Problem Relation Age of Onset  . Asthma Mother   . Stroke Mother   . Hypertension Mother   . Heart attack Father   . Hypertension Father   . Hyperlipidemia Father   . Dementia Father   . Emphysema Maternal Grandmother   . Hypertension Maternal Grandmother   . Colon cancer Paternal Grandmother     Allergies  Allergen Reactions  . Percocet [Oxycodone-Acetaminophen] Other (See Comments)    "makes me dizzy"  . Lisinopril Cough  . Losartan     Medication list has been reviewed and updated.  Current Outpatient Medications on File Prior to Visit  Medication Sig Dispense Refill  . afatinib dimaleate (GILOTRIF) 40 MG tablet Take 1 tablet (40 mg total) by mouth daily. Take on an empty stomach 1hr before or 2 hrs after meals. 30 tablet 2  . ammonium lactate (LAC-HYDRIN) 12 % lotion Apply topically as needed for dry skin. 400 g 0  . clindamycin (CLINDAGEL) 1 % gel Apply topically 2 (two) times daily. 30 g 1  . dextromethorphan-guaiFENesin (MUCINEX DM) 30-600 MG 12hr tablet Take 1 tablet by mouth 2 (two) times daily as needed for cough.    . furosemide (LASIX) 40 MG tablet Take 1 tablet (40 mg total) by mouth daily. 90 tablet 3  . loperamide (IMODIUM) 1 MG/5ML solution Take 2 mg as needed by mouth for diarrhea or loose stools.    . magnesium oxide (MAG-OX) 400 (241.3 Mg) MG tablet Take 1 tablet (400 mg total) by mouth daily.    . Oxycodone HCl 10 MG TABS Take 1 tablet (10 mg total) by mouth 3 (three) times daily as needed. 90 tablet 0  . pantoprazole (PROTONIX) 40 MG tablet Take 1 tablet (40 mg total) by mouth daily at 12 noon. 90 tablet 1  . potassium  chloride SA (K-DUR,KLOR-CON) 20 MEQ tablet Take 1 tablet (20 mEq total) by mouth 2 (two) times daily. 180 tablet 1  . prochlorperazine (COMPAZINE) 10 MG tablet Take 1 tablet (10 mg total) every 6 (six) hours as needed by mouth for nausea or vomiting. 30 tablet 1  . senna-docusate (SENOKOT-S) 8.6-50 MG tablet Take 1 tablet by mouth at bedtime.     No current facility-administered medications on file prior to visit.     Review of Systems:  As per HPI- otherwise negative. No fever or chills Her energy level is increasing although not quite normal as of yet Pain from her talc procedure is improving    Physical  Examination: Vitals:   04/01/17 1245  BP: 112/72  Pulse: 75  Temp: 98 F (36.7 C)  SpO2: 98%   Vitals:   04/01/17 1245  Weight: 203 lb 12.8 oz (92.4 kg)  Height: 5' 6"  (1.676 m)   Body mass index is 32.89 kg/m. Ideal Body Weight: Weight in (lb) to have BMI = 25: 154.6  GEN: WDWN, NAD, Non-toxic, A & O x 3- she looks great today HEENT: Atraumatic, Normocephalic. Neck supple. No masses, No LAD.  Bilateral TM wnl, oropharynx normal.  PEERL,EOMI.   Ears and Nose: No external deformity. CV: RRR, No M/G/R. No JVD. No thrill. No extra heart sounds. PULM: CTA B, no wheezes, crackles, rhonchi. No retractions. No resp. distress. No accessory muscle use. EXTR: No c/c/e NEURO Normal gait.  PSYCH: Normally interactive. Conversant. Not depressed or anxious appearing.  Calm demeanor.  She has spasm and knottiness of the right trapezius muscle.  Normal ROM of the shoulder No rash to suggest zoster Scars on her chest wall are healing as expected    Assessment and Plan: Adenocarcinoma of left lung, stage 4 ([facility])  Muscle spasm - Plan: methocarbamol (ROBAXIN) 500 MG tablet  Mackinzie is responding very well to her cancer treatment and we are thrilled for her.  She is feeling great right now Gave her an rx for robaxin to use prn for muscle pain and tightness in her right  shoulder  Signed Lamar Blinks, MD

## 2017-04-01 ENCOUNTER — Ambulatory Visit (INDEPENDENT_AMBULATORY_CARE_PROVIDER_SITE_OTHER): Payer: 59 | Admitting: Family Medicine

## 2017-04-01 ENCOUNTER — Encounter: Payer: Self-pay | Admitting: Family Medicine

## 2017-04-01 VITALS — BP 112/72 | HR 75 | Temp 98.0°F | Ht 66.0 in | Wt 203.8 lb

## 2017-04-01 DIAGNOSIS — M62838 Other muscle spasm: Secondary | ICD-10-CM

## 2017-04-01 DIAGNOSIS — C3492 Malignant neoplasm of unspecified part of left bronchus or lung: Secondary | ICD-10-CM

## 2017-04-01 MED ORDER — METHOCARBAMOL 500 MG PO TABS: 500.0000 mg | ORAL_TABLET | Freq: Three times a day (TID) | ORAL | 1 refills | Status: DC | PRN

## 2017-04-01 MED FILL — METHOCARBAMOL 500 MG TABLET: 500 | 10 days supply | Qty: 30 | Fill #0

## 2017-04-01 NOTE — Patient Instructions (Signed)
Use the oxycodone at night as needed for pain  During the day you can use the robaxin as needed for muscle spasm - this can make you a bit drowsy, do not combine with oxycodone or use when you need to drive.

## 2017-04-03 ENCOUNTER — Ambulatory Visit: Payer: 59 | Admitting: Internal Medicine

## 2017-04-14 ENCOUNTER — Inpatient Hospital Stay: Payer: 59 | Attending: Oncology | Admitting: Internal Medicine

## 2017-04-14 ENCOUNTER — Inpatient Hospital Stay: Payer: 59

## 2017-04-14 ENCOUNTER — Encounter: Payer: Self-pay | Admitting: Internal Medicine

## 2017-04-14 ENCOUNTER — Telehealth: Payer: Self-pay | Admitting: Internal Medicine

## 2017-04-14 VITALS — BP 114/62 | HR 69 | Temp 97.9°F | Resp 18 | Ht 66.0 in | Wt 206.3 lb

## 2017-04-14 DIAGNOSIS — C3492 Malignant neoplasm of unspecified part of left bronchus or lung: Secondary | ICD-10-CM

## 2017-04-14 LAB — CBC WITH DIFFERENTIAL (CANCER CENTER ONLY)
Basophils Absolute: 0 10*3/uL (ref 0.0–0.1)
Basophils Relative: 1 %
Eosinophils Absolute: 0.4 10*3/uL (ref 0.0–0.5)
Eosinophils Relative: 5 %
HCT: 36.2 % (ref 34.8–46.6)
Hemoglobin: 11.6 g/dL (ref 11.6–15.9)
Lymphocytes Relative: 24 %
Lymphs Abs: 1.9 10*3/uL (ref 0.9–3.3)
MCH: 27.4 pg (ref 25.1–34.0)
MCHC: 32 g/dL (ref 31.5–36.0)
MCV: 85.6 fL (ref 79.5–101.0)
Monocytes Absolute: 0.6 10*3/uL (ref 0.1–0.9)
Monocytes Relative: 7 %
Neutro Abs: 5 10*3/uL (ref 1.5–6.5)
Neutrophils Relative %: 63 %
Platelet Count: 287 10*3/uL (ref 145–400)
RBC: 4.23 MIL/uL (ref 3.70–5.45)
RDW: 18.6 % — ABNORMAL HIGH (ref 11.2–14.5)
WBC Count: 8 10*3/uL (ref 3.9–10.3)

## 2017-04-14 LAB — CMP (CANCER CENTER ONLY)
ALT: 10 U/L (ref 0–55)
AST: 17 U/L (ref 5–34)
Albumin: 3.4 g/dL — ABNORMAL LOW (ref 3.5–5.0)
Alkaline Phosphatase: 98 U/L (ref 40–150)
Anion gap: 8 (ref 3–11)
BUN: 18 mg/dL (ref 7–26)
CO2: 30 mmol/L — ABNORMAL HIGH (ref 22–29)
Calcium: 9.4 mg/dL (ref 8.4–10.4)
Chloride: 103 mmol/L (ref 98–109)
Creatinine: 1.02 mg/dL (ref 0.60–1.10)
GFR, Est AFR Am: >60 mL/min (ref >60)
GFR, Estimated: >60 mL/min (ref >60)
Glucose, Bld: 87 mg/dL (ref 70–140)
Potassium: 3.8 mmol/L (ref 3.5–5.1)
Sodium: 141 mmol/L (ref 136–145)
Total Bilirubin: 0.4 mg/dL (ref 0.2–1.2)
Total Protein: 7 g/dL (ref 6.4–8.3)

## 2017-04-14 NOTE — Telephone Encounter (Signed)
Scheduled appt per 3/19 los - Gave patient AVS and calender per los,

## 2017-04-14 NOTE — Progress Notes (Signed)
[facility] [phone]   [phone]  OFFICE PROGRESS NOTE  Copland, Gay Filler, MD [address] [place] [address]  DIAGNOSIS: DIAGNOSIS: stage IV (T3, N2, M1 a) non-small cell lung cancer, poorly differentiated adenocarcinoma with extensive miliary distribution in the lungs bilaterally diagnosed in September 2018. 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 NEGATIVE for the Exon 20 T790M mutation  PRIOR THERAPY: None.  CURRENT THERAPY: Gilotrif 40 mg daily started 11/08/2016.Status post 5 months of treatment.  INTERVAL HISTORY: Nancy Moore 52 y.o. female returns to the clinic today for follow-up visit.  The patient is feeling fine today with no specific complaints.  She continues to tolerate her treatment with GILOTRIF fairly well.  She denied having any chest pain, shortness of breath, cough or hemoptysis.  She has intermittent back pain between the shoulder blades.  She has no significant weight loss or night sweats.  She has no nausea, vomiting, diarrhea or constipation.  She denied having any significant skin rash.  She is here today for evaluation and repeat blood work.  MEDICAL HISTORY: Past Medical History:  Diagnosis Date  . Adenocarcinoma of left lung, stage 4 ([facility]) 10/28/2016  . Goals of care, counseling/discussion 10/28/2016  . Hypertension     ALLERGIES:  is allergic to percocet [oxycodone-acetaminophen]; lisinopril; and losartan.  MEDICATIONS:  Current Outpatient Medications  Medication Sig Dispense Refill  . afatinib dimaleate (GILOTRIF) 40 MG tablet Take 1 tablet (40 mg total) by mouth daily. Take on an empty stomach 1hr before or 2 hrs after meals. 30 tablet 2  . ammonium lactate (LAC-HYDRIN) 12 % lotion Apply topically as needed for dry skin. 400 g 0  . clindamycin (CLINDAGEL) 1 % gel Apply topically 2 (two) times daily. 30 g 1  . furosemide (LASIX) 40 MG tablet Take 1 tablet (40 mg total) by mouth  daily. 90 tablet 3  . magnesium oxide (MAG-OX) 400 (241.3 Mg) MG tablet Take 1 tablet (400 mg total) by mouth daily.    . methocarbamol (ROBAXIN) 500 MG tablet Take 1 tablet (500 mg total) by mouth every 8 (eight) hours as needed for muscle spasms. 30 tablet 1  . Oxycodone HCl 10 MG TABS Take 1 tablet (10 mg total) by mouth 3 (three) times daily as needed. 90 tablet 0  . pantoprazole (PROTONIX) 40 MG tablet Take 1 tablet (40 mg total) by mouth daily at 12 noon. 90 tablet 1  . potassium chloride SA (K-DUR,KLOR-CON) 20 MEQ tablet Take 1 tablet (20 mEq total) by mouth 2 (two) times daily. 180 tablet 1  . senna-docusate (SENOKOT-S) 8.6-50 MG tablet Take 1 tablet by mouth at bedtime.    Marland Kitchen dextromethorphan-guaiFENesin (MUCINEX DM) 30-600 MG 12hr tablet Take 1 tablet by mouth 2 (two) times daily as needed for cough. (Patient not taking: Reported on 04/14/2017)    . loperamide (IMODIUM) 1 MG/5ML solution Take 2 mg as needed by mouth for diarrhea or loose stools.    . prochlorperazine (COMPAZINE) 10 MG tablet Take 1 tablet (10 mg total) every 6 (six) hours as needed by mouth for nausea or vomiting. (Patient not taking: Reported on 04/14/2017) 30 tablet 1   No current facility-administered medications for this visit.     SURGICAL HISTORY:  Past Surgical History:  Procedure Laterality Date  . BREAST EXCISIONAL BIOPSY Left 20+ yrs ago   benign  . PERICARDIAL WINDOW N/A 01/10/2017   Procedure: PERICARDIAL WINDOW- SUB XYPHOID,  RIGHT CHEST TUBE;  Surgeon: Ivin Poot, MD;  Location: [facility];  Service: Thoracic;  Laterality: N/A;  . VIDEO BRONCHOSCOPY Bilateral 10/21/2016   Procedure: VIDEO BRONCHOSCOPY WITH FLUORO;  Surgeon: Tanda Rockers, MD;  Location: [facility];  Service: Cardiopulmonary;  Laterality: Bilateral;    REVIEW OF SYSTEMS:  A comprehensive review of systems was negative except for: Musculoskeletal: positive for back pain   PHYSICAL EXAMINATION: General appearance: alert, cooperative  and no distress Head: Normocephalic, without obvious abnormality, atraumatic Neck: no adenopathy, no JVD, supple, symmetrical, trachea midline and thyroid not enlarged, symmetric, no tenderness/mass/nodules Lymph nodes: Cervical, supraclavicular, and axillary nodes normal. Resp: clear to auscultation bilaterally Back: symmetric, no curvature. ROM normal. No CVA tenderness. Cardio: regular rate and rhythm, S1, S2 normal, no murmur, click, rub or gallop GI: soft, non-tender; bowel sounds normal; no masses,  no organomegaly Extremities: extremities normal, atraumatic, no cyanosis or edema  ECOG PERFORMANCE STATUS: 1 - Symptomatic but completely ambulatory  Blood pressure 114/62, pulse 69, temperature 97.9 F (36.6 C), temperature source Oral, resp. rate 18, height _0  (1.676 m), weight 206 lb 4.8 oz (93.6 kg), last menstrual period 03/15/2010, SpO2 100 %.  LABORATORY DATA: Lab Results  Component Value Date   WBC 8.0 04/14/2017   HGB 9.0 (L) 01/21/2017   HCT 36.2 04/14/2017   MCV 85.6 04/14/2017   PLT 287 04/14/2017      Chemistry      Component Value Date/Time   NA 139 03/17/2017 1150   NA 135 (L) 01/08/2017 1055   K 4.2 03/17/2017 1150   K 3.2 (L) 01/08/2017 1055   CL 102 03/17/2017 1150   CO2 30 (H) 03/17/2017 1150   CO2 25 01/08/2017 1055   BUN 12 03/17/2017 1150   BUN 14.6 01/08/2017 1055   CREATININE 0.94 03/17/2017 1150   CREATININE 1.3 (H) 01/08/2017 1055      Component Value Date/Time   CALCIUM 9.7 03/17/2017 1150   CALCIUM 9.5 01/08/2017 1055   ALKPHOS 88 03/17/2017 1150   ALKPHOS 82 01/08/2017 1055   AST 20 03/17/2017 1150   AST 31 01/08/2017 1055   ALT 16 03/17/2017 1150   ALT 34 01/08/2017 1055   BILITOT 0.5 03/17/2017 1150   BILITOT 1.74 (H) 01/08/2017 1055       RADIOGRAPHIC STUDIES: Ct Chest W Contrast  Result Date: 03/17/2017 CLINICAL DATA:  Metastatic left lung cancer. Oral chemotherapy. Prior talc pleurodesis December 2018. Mid upper  abdominal pain. EXAM: CT CHEST AND ABDOMEN WITH CONTRAST TECHNIQUE: Multidetector CT imaging of the chest and abdomen was performed following the standard protocol during bolus administration of intravenous contrast. CONTRAST:  148m ISOVUE-300 IOPAMIDOL (ISOVUE-300) INJECTION 61% COMPARISON:  01/08/2017 FINDINGS: CT CHEST FINDINGS Cardiovascular: Moderate to large pericardial effusion, although considerably improved compared to 01/08/2017. Mediastinum/Nodes: Indistinct AP window lymph node 0.7 cm in short axis, previously 1.0 cm. Lungs/Pleura: Small bilateral pleural effusions, significantly improved from the prior exam. There is some bandlike density along the right posterior pleural space likely a manifestation of prior pleurodesis. Bilateral interstitial and nodular opacities in the lungs with random distribution of nodules and patchy ground-glass opacities observed. The airway thickening shown on the prior exam is improved. There is some continued bandlike density in the superior segment left lower lobe tension from atelectasis or tumor. There has been some interval clearing of the airspace opacity previously present posteriorly in the left upper lobe. Musculoskeletal: Overall stable size and appearance of the sclerotic lesion eccentric to  the left in the T3 vertebral body. No new thoracic metastatic lesion is identified. CT ABDOMEN FINDINGS Hepatobiliary: Unremarkable Pancreas: Unremarkable Spleen: Unremarkable Adrenals/Urinary Tract: Unremarkable Stomach/Bowel: Prominent stool throughout the colon favors constipation. Vascular/Lymphatic: Aortoiliac atherosclerotic vascular disease. A lymph node along the root of the mesentery measures 0.8 cm in short axis on image 76/2, formerly 1.0 cm. No current pathologically enlarged adenopathy. Other: No supplemental non-categorized findings. Musculoskeletal: Unremarkable IMPRESSION: 1. Improved although still notable pericardial effusion. 2. Improved bilateral pleural  effusions, with evidence of prior right talc pleurodesis. 3. Similar appearance of scattered miliary nodularity in the lungs with a primarily random distribution. There is some associated interstitial accentuation and regions of ground-glass opacity. This was previously hypermetabolic and suspicious for lymphangitic spread of tumor. There has been some improvement in the airspace opacity in the left upper lobe compared to the prior exam. 4. Stable appearance of the sclerotic lesion in the left T3 vertebral body compatible with metastatic lesion. 5. With respect to the patient's mid to upper abdominal pain, the only specific correlating finding on today's exam is prominence of stool throughout the colon favoring constipation. 6. Aortic Atherosclerosis (ICD10-I70.0). Electronically Signed   By: Van Clines M.D.   On: 03/17/2017 15:48   Ct Abdomen W Contrast  Result Date: 03/17/2017 CLINICAL DATA:  Metastatic left lung cancer. Oral chemotherapy. Prior talc pleurodesis December 2018. Mid upper abdominal pain. EXAM: CT CHEST AND ABDOMEN WITH CONTRAST TECHNIQUE: Multidetector CT imaging of the chest and abdomen was performed following the standard protocol during bolus administration of intravenous contrast. CONTRAST:  1109m ISOVUE-300 IOPAMIDOL (ISOVUE-300) INJECTION 61% COMPARISON:  01/08/2017 FINDINGS: CT CHEST FINDINGS Cardiovascular: Moderate to large pericardial effusion, although considerably improved compared to 01/08/2017. Mediastinum/Nodes: Indistinct AP window lymph node 0.7 cm in short axis, previously 1.0 cm. Lungs/Pleura: Small bilateral pleural effusions, significantly improved from the prior exam. There is some bandlike density along the right posterior pleural space likely a manifestation of prior pleurodesis. Bilateral interstitial and nodular opacities in the lungs with random distribution of nodules and patchy ground-glass opacities observed. The airway thickening shown on the prior exam is  improved. There is some continued bandlike density in the superior segment left lower lobe tension from atelectasis or tumor. There has been some interval clearing of the airspace opacity previously present posteriorly in the left upper lobe. Musculoskeletal: Overall stable size and appearance of the sclerotic lesion eccentric to the left in the T3 vertebral body. No new thoracic metastatic lesion is identified. CT ABDOMEN FINDINGS Hepatobiliary: Unremarkable Pancreas: Unremarkable Spleen: Unremarkable Adrenals/Urinary Tract: Unremarkable Stomach/Bowel: Prominent stool throughout the colon favors constipation. Vascular/Lymphatic: Aortoiliac atherosclerotic vascular disease. A lymph node along the root of the mesentery measures 0.8 cm in short axis on image 76/2, formerly 1.0 cm. No current pathologically enlarged adenopathy. Other: No supplemental non-categorized findings. Musculoskeletal: Unremarkable IMPRESSION: 1. Improved although still notable pericardial effusion. 2. Improved bilateral pleural effusions, with evidence of prior right talc pleurodesis. 3. Similar appearance of scattered miliary nodularity in the lungs with a primarily random distribution. There is some associated interstitial accentuation and regions of ground-glass opacity. This was previously hypermetabolic and suspicious for lymphangitic spread of tumor. There has been some improvement in the airspace opacity in the left upper lobe compared to the prior exam. 4. Stable appearance of the sclerotic lesion in the left T3 vertebral body compatible with metastatic lesion. 5. With respect to the patient's mid to upper abdominal pain, the only specific correlating finding on today's exam is prominence  of stool throughout the colon favoring constipation. 6. Aortic Atherosclerosis (ICD10-I70.0). Electronically Signed   By: Van Clines M.D.   On: 03/17/2017 15:48    ASSESSMENT AND PLAN: This is a very pleasant 52 years old African-American  female with metastatic non-small cell lung cancer, adenocarcinoma and positive for EGFR mutation with deletion in exon 19.  The patient is currently on treatment with GILOTRIF 40 mg p.o. daily status post 5 months.  She has been tolerating her treatment fairly well with no concerning complaints.  She had good response to this treatment on the previous scans.  I recommended for her to continue her current treatment with GILOTRIF with the same dose. I will see her back for follow-up visit in 1 month for evaluation with repeat blood work. The patient was advised to call immediately if she has any concerning symptoms in the interval. The patient voices understanding of current disease status and treatment options and is in agreement with the current care plan.  All questions were answered. The patient knows to call the clinic with any problems, questions or concerns. We can certainly see the patient much sooner if necessary.  I spent 10 minutes counseling the patient face to face. The total time spent in the appointment was 15 minutes.  Disclaimer: This note was dictated with voice recognition software. Similar sounding words can inadvertently be transcribed and may not be corrected upon review.

## 2017-05-11 NOTE — Progress Notes (Signed)
Completed Disability paperwork and successfully faxed to [employer] at fax # [phone].

## 2017-05-20 ENCOUNTER — Telehealth: Payer: Self-pay | Admitting: Internal Medicine

## 2017-05-20 NOTE — Telephone Encounter (Signed)
Rec'd attending physician statement from [facility]. Fwd to [facility] - via interoffice mail -pr

## 2017-05-26 ENCOUNTER — Inpatient Hospital Stay: Payer: 59

## 2017-05-26 ENCOUNTER — Encounter: Payer: Self-pay | Admitting: Internal Medicine

## 2017-05-26 ENCOUNTER — Inpatient Hospital Stay: Payer: 59 | Attending: Oncology | Admitting: Internal Medicine

## 2017-05-26 ENCOUNTER — Telehealth: Payer: Self-pay

## 2017-05-26 VITALS — BP 106/64 | HR 68 | Temp 97.8°F | Resp 18 | Ht 66.0 in | Wt 209.1 lb

## 2017-05-26 DIAGNOSIS — I10 Essential (primary) hypertension: Secondary | ICD-10-CM

## 2017-05-26 DIAGNOSIS — C3492 Malignant neoplasm of unspecified part of left bronchus or lung: Secondary | ICD-10-CM

## 2017-05-26 DIAGNOSIS — Z5111 Encounter for antineoplastic chemotherapy: Secondary | ICD-10-CM

## 2017-05-26 DIAGNOSIS — C3490 Malignant neoplasm of unspecified part of unspecified bronchus or lung: Secondary | ICD-10-CM | POA: Diagnosis not present

## 2017-05-26 LAB — CMP (CANCER CENTER ONLY)
ALT: 10 U/L (ref 0–55)
AST: 20 U/L (ref 5–34)
Albumin: 3.9 g/dL (ref 3.5–5.0)
Alkaline Phosphatase: 112 U/L (ref 40–150)
Anion gap: 7 (ref 3–11)
BUN: 21 mg/dL (ref 7–26)
CO2: 30 mmol/L — ABNORMAL HIGH (ref 22–29)
Calcium: 9.6 mg/dL (ref 8.4–10.4)
Chloride: 101 mmol/L (ref 98–109)
Creatinine: 0.98 mg/dL (ref 0.60–1.10)
GFR, Est AFR Am: >60 mL/min (ref >60)
GFR, Estimated: >60 mL/min (ref >60)
Glucose, Bld: 79 mg/dL (ref 70–140)
Potassium: 4.1 mmol/L (ref 3.5–5.1)
Sodium: 138 mmol/L (ref 136–145)
Total Bilirubin: 0.3 mg/dL (ref 0.2–1.2)
Total Protein: 7.3 g/dL (ref 6.4–8.3)

## 2017-05-26 LAB — CBC WITH DIFFERENTIAL (CANCER CENTER ONLY)
Basophils Absolute: 0.1 10*3/uL (ref 0.0–0.1)
Basophils Relative: 1 %
Eosinophils Absolute: 0.3 10*3/uL (ref 0.0–0.5)
Eosinophils Relative: 4 %
HCT: 37.3 % (ref 34.8–46.6)
Hemoglobin: 12.1 g/dL (ref 11.6–15.9)
Lymphocytes Relative: 27 %
Lymphs Abs: 2.4 10*3/uL (ref 0.9–3.3)
MCH: 28.3 pg (ref 25.1–34.0)
MCHC: 32.5 g/dL (ref 31.5–36.0)
MCV: 87.1 fL (ref 79.5–101.0)
Monocytes Absolute: 0.7 10*3/uL (ref 0.1–0.9)
Monocytes Relative: 8 %
Neutro Abs: 5.5 10*3/uL (ref 1.5–6.5)
Neutrophils Relative %: 60 %
Platelet Count: 279 10*3/uL (ref 145–400)
RBC: 4.29 MIL/uL (ref 3.70–5.45)
RDW: 15.9 % — ABNORMAL HIGH (ref 11.2–14.5)
WBC Count: 8.8 10*3/uL (ref 3.9–10.3)

## 2017-05-26 NOTE — Progress Notes (Signed)
[facility] [phone]   [phone]  OFFICE PROGRESS NOTE  Moore, Nancy Filler, MD [address] [place] [address]  DIAGNOSIS: DIAGNOSIS: stage IV (T3, N2, M1 a) non-small cell lung cancer, poorly differentiated adenocarcinoma with extensive miliary distribution in the lungs bilaterally diagnosed in September 2018. 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 NEGATIVE for the Exon 20 T790M mutation  PRIOR THERAPY: None.  CURRENT THERAPY: Gilotrif 40 mg daily started 11/08/2016.Status post 6 months of treatment.  INTERVAL HISTORY: Nancy Moore 52 y.o. female returns to the clinic today for follow-up visit accompanied by a family member.  The patient is feeling fine today with no specific complaints except for some aching pain in the lower extremities in the evening.  She denied having any chest pain but continues to have shortness of breath with exertion with no mild cough and no hemoptysis.  She denied having any fever or chills.  She has no nausea, vomiting, diarrhea or constipation.  She denied having any concerning skin rash.  She continues to tolerate her treatment with GILOTRIF fairly well.  She is here today for evaluation with repeat blood work.  MEDICAL HISTORY: Past Medical History:  Diagnosis Date  . Adenocarcinoma of left lung, stage 4 ([place]) 10/28/2016  . Goals of care, counseling/discussion 10/28/2016  . Hypertension     ALLERGIES:  is allergic to percocet [oxycodone-acetaminophen]; lisinopril; and losartan.  MEDICATIONS:  Current Outpatient Medications  Medication Sig Dispense Refill  . afatinib dimaleate (GILOTRIF) 40 MG tablet Take 1 tablet (40 mg total) by mouth daily. Take on an empty stomach 1hr before or 2 hrs after meals. 30 tablet 2  . ammonium lactate (LAC-HYDRIN) 12 % lotion Apply topically as needed for dry skin. 400 g 0  . clindamycin (CLINDAGEL) 1 % gel Apply topically 2 (two) times daily. 30 g 1    . dextromethorphan-guaiFENesin (MUCINEX DM) 30-600 MG 12hr tablet Take 1 tablet by mouth 2 (two) times daily as needed for cough.    . furosemide (LASIX) 40 MG tablet Take 1 tablet (40 mg total) by mouth daily. 90 tablet 3  . loperamide (IMODIUM) 1 MG/5ML solution Take 2 mg as needed by mouth for diarrhea or loose stools.    . magnesium oxide (MAG-OX) 400 (241.3 Mg) MG tablet Take 1 tablet (400 mg total) by mouth daily.    . methocarbamol (ROBAXIN) 500 MG tablet Take 1 tablet (500 mg total) by mouth every 8 (eight) hours as needed for muscle spasms. 30 tablet 1  . Oxycodone HCl 10 MG TABS Take 1 tablet (10 mg total) by mouth 3 (three) times daily as needed. 90 tablet 0  . pantoprazole (PROTONIX) 40 MG tablet Take 1 tablet (40 mg total) by mouth daily at 12 noon. 90 tablet 1  . potassium chloride SA (K-DUR,KLOR-CON) 20 MEQ tablet Take 1 tablet (20 mEq total) by mouth 2 (two) times daily. 180 tablet 1  . prochlorperazine (COMPAZINE) 10 MG tablet Take 1 tablet (10 mg total) every 6 (six) hours as needed by mouth for nausea or vomiting. 30 tablet 1  . senna-docusate (SENOKOT-S) 8.6-50 MG tablet Take 1 tablet by mouth at bedtime.     No current facility-administered medications for this visit.     SURGICAL HISTORY:  Past Surgical History:  Procedure Laterality Date  . BREAST EXCISIONAL BIOPSY Left 20+ yrs ago   benign  . PERICARDIAL WINDOW N/A 01/10/2017   Procedure:  PERICARDIAL WINDOW- SUB XYPHOID, RIGHT CHEST TUBE;  Surgeon: Prescott Gum, Collier Salina, MD;  Location: [facility];  Service: Thoracic;  Laterality: N/A;  . VIDEO BRONCHOSCOPY Bilateral 10/21/2016   Procedure: VIDEO BRONCHOSCOPY WITH FLUORO;  Surgeon: Tanda Rockers, MD;  Location: [facility];  Service: Cardiopulmonary;  Laterality: Bilateral;    REVIEW OF SYSTEMS:  A comprehensive review of systems was negative except for: Musculoskeletal: positive for arthralgias   PHYSICAL EXAMINATION: General appearance: alert, cooperative and no  distress Head: Normocephalic, without obvious abnormality, atraumatic Neck: no adenopathy, no JVD, supple, symmetrical, trachea midline and thyroid not enlarged, symmetric, no tenderness/mass/nodules Lymph nodes: Cervical, supraclavicular, and axillary nodes normal. Resp: clear to auscultation bilaterally Back: symmetric, no curvature. ROM normal. No CVA tenderness. Cardio: regular rate and rhythm, S1, S2 normal, no murmur, click, rub or gallop GI: soft, non-tender; bowel sounds normal; no masses,  no organomegaly Extremities: extremities normal, atraumatic, no cyanosis or edema  ECOG PERFORMANCE STATUS: 1 - Symptomatic but completely ambulatory  Blood pressure 106/64, pulse 68, temperature 97.8 F (36.6 C), temperature source Oral, resp. rate 18, height _0  (1.676 m), weight 209 lb 1.6 oz (94.8 kg), last menstrual period 03/15/2010, SpO2 100 %.  LABORATORY DATA: Lab Results  Component Value Date   WBC 8.8 05/26/2017   HGB 12.1 05/26/2017   HCT 37.3 05/26/2017   MCV 87.1 05/26/2017   PLT 279 05/26/2017      Chemistry      Component Value Date/Time   NA 141 04/14/2017 1502   NA 135 (L) 01/08/2017 1055   K 3.8 04/14/2017 1502   K 3.2 (L) 01/08/2017 1055   CL 103 04/14/2017 1502   CO2 30 (H) 04/14/2017 1502   CO2 25 01/08/2017 1055   BUN 18 04/14/2017 1502   BUN 14.6 01/08/2017 1055   CREATININE 1.02 04/14/2017 1502   CREATININE 1.3 (H) 01/08/2017 1055      Component Value Date/Time   CALCIUM 9.4 04/14/2017 1502   CALCIUM 9.5 01/08/2017 1055   ALKPHOS 98 04/14/2017 1502   ALKPHOS 82 01/08/2017 1055   AST 17 04/14/2017 1502   AST 31 01/08/2017 1055   ALT 10 04/14/2017 1502   ALT 34 01/08/2017 1055   BILITOT 0.4 04/14/2017 1502   BILITOT 1.74 (H) 01/08/2017 1055       RADIOGRAPHIC STUDIES: No results found.  ASSESSMENT AND PLAN: This is a very pleasant 52 years old African-American female with metastatic non-small cell lung cancer, adenocarcinoma and positive  for EGFR mutation with deletion in exon 19.  The patient is currently on treatment with GILOTRIF 40 mg p.o. daily status post 6 months.  The patient continues to tolerate this treatment well with no concerning complaints. I recommended for her to continue her current treatment with GILOTRIF with the same dose. I will see her back for follow-up visit in 1 months for evaluation after repeating CT scan of the chest, abdomen and pelvis for restaging of her disease. The patient was advised to call immediately if she has any concerning symptoms in the interval. The patient voices understanding of current disease status and treatment options and is in agreement with the current care plan. All questions were answered. The patient knows to call the clinic with any problems, questions or concerns. We can certainly see the patient much sooner if necessary.  I spent 10 minutes counseling the patient face to face. The total time spent in the appointment was 15 minutes.  Disclaimer: This note was dictated  with voice recognition software. Similar sounding words can inadvertently be transcribed and may not be corrected upon review.

## 2017-05-26 NOTE — Telephone Encounter (Signed)
Printed avs and calender of upcoming appointment per 4/30 los also gave patient contrast, instructions, and Radiology number.

## 2017-06-08 ENCOUNTER — Telehealth: Payer: Self-pay | Admitting: Medical Oncology

## 2017-06-08 ENCOUNTER — Encounter (HOSPITAL_COMMUNITY): Payer: Self-pay | Admitting: Emergency Medicine

## 2017-06-08 ENCOUNTER — Emergency Department (HOSPITAL_COMMUNITY): Payer: 59

## 2017-06-08 ENCOUNTER — Emergency Department (HOSPITAL_COMMUNITY)
Admission: EM | Admit: 2017-06-08 | Discharge: 2017-06-09 | Disposition: A | Discharge status: Home / Self Care | Payer: 59 | Attending: Emergency Medicine | Admitting: Emergency Medicine

## 2017-06-08 DIAGNOSIS — J209 Acute bronchitis, unspecified: Secondary | ICD-10-CM | POA: Diagnosis not present

## 2017-06-08 DIAGNOSIS — I10 Essential (primary) hypertension: Secondary | ICD-10-CM | POA: Diagnosis not present

## 2017-06-08 DIAGNOSIS — Z85118 Personal history of other malignant neoplasm of bronchus and lung: Secondary | ICD-10-CM | POA: Insufficient documentation

## 2017-06-08 DIAGNOSIS — R002 Palpitations: Secondary | ICD-10-CM | POA: Diagnosis not present

## 2017-06-08 DIAGNOSIS — Z79899 Other long term (current) drug therapy: Secondary | ICD-10-CM | POA: Diagnosis not present

## 2017-06-08 LAB — BASIC METABOLIC PANEL
Anion gap: 8 (ref 5–15)
BUN: 16 mg/dL (ref 6–20)
CO2: 27 mmol/L (ref 22–32)
Calcium: 8.9 mg/dL (ref 8.9–10.3)
Chloride: 103 mmol/L (ref 101–111)
Creatinine, Ser: 1.14 mg/dL — ABNORMAL HIGH (ref 0.44–1.00)
GFR calc Af Amer: >60 mL/min (ref >60)
GFR calc non Af Amer: 55 mL/min — ABNORMAL LOW (ref >60)
Glucose, Bld: 106 mg/dL — ABNORMAL HIGH (ref 65–99)
Potassium: 3.8 mmol/L (ref 3.5–5.1)
Sodium: 138 mmol/L (ref 135–145)

## 2017-06-08 LAB — CBC
HCT: 38.7 % (ref 36.0–46.0)
Hemoglobin: 12.2 g/dL (ref 12.0–15.0)
MCH: 28.6 pg (ref 26.0–34.0)
MCHC: 31.5 g/dL (ref 30.0–36.0)
MCV: 90.6 fL (ref 78.0–100.0)
Platelets: 287 10*3/uL (ref 150–400)
RBC: 4.27 MIL/uL (ref 3.87–5.11)
RDW: 15.3 % (ref 11.5–15.5)
WBC: 11.5 10*3/uL — ABNORMAL HIGH (ref 4.0–10.5)

## 2017-06-08 LAB — I-STAT CG4 LACTIC ACID, ED: Lactic Acid, Venous: 0.65 mmol/L (ref 0.5–1.9)

## 2017-06-08 LAB — I-STAT TROPONIN, ED: Troponin i, poc: 0 ng/mL (ref 0.00–0.08)

## 2017-06-08 MED FILL — PANTOPRAZOLE SOD DR 40 MG T: 40 | 30 days supply | Qty: 30 | Fill #0

## 2017-06-08 MED FILL — FUROSEMIDE 40 MG TAB: 40 | 30 days supply | Qty: 30 | Fill #0

## 2017-06-08 MED FILL — POTASSIUM CL ER 20 MEQ TABL: 20 | 30 days supply | Qty: 60 | Fill #0

## 2017-06-08 NOTE — ED Provider Notes (Addendum)
[facility] EMERGENCY DEPARTMENT Provider Note   CSN: [id number] Arrival date & time: 06/08/17  2040     History   Chief Complaint Chief Complaint  Patient presents with  . Palpitations    HPI Nancy Moore is a 52 y.o. female.  Patient is a 52 year old female with past medical history of stage IV adenocarcinoma of the lung, hypertension.  She presents today for evaluation of elevated heart rate and upper respiratory symptoms.  She reports a several day history of cough, sore throat, congestion and "losing her voice".  This evening she checked her heart rate and noticed it was 109.  She also states that she had a fever of 100.8.  She denies to me that she is experiencing any chest pain or difficulty breathing.  The history is provided by the patient.  Palpitations   This is a new problem. The current episode started 1 to 2 hours ago. The problem occurs constantly. The problem has been resolved. The problem is associated with an unknown factor. Associated symptoms include a fever. Pertinent negatives include no malaise/fatigue, no chest pain, no chest pressure, no abdominal pain, no shortness of breath and no sputum production. She has tried nothing for the symptoms.    Past Medical History:  Diagnosis Date  . Adenocarcinoma of left lung, stage 4 ([place]) 10/28/2016  . Goals of care, counseling/discussion 10/28/2016  . Hypertension     Patient Active Problem List   Diagnosis Date Noted  . Cardiac/pericardial tamponade   . Chest tube in place   . Acute lower UTI   . Pain   . Acute blood loss anemia   . Palliative care encounter   . AKI (acute kidney injury) ([place]) 01/10/2017  . Hypotension 01/09/2017  . Essential hypertension 01/09/2017  . Pleural effusion 01/09/2017  . Hypokalemia 01/09/2017  . Dyspnea 01/09/2017  . Leukocytosis 01/09/2017  . Pleural effusion on right 01/08/2017  . Encounter for antineoplastic chemotherapy 11/20/2016  . Adenocarcinoma of  left lung, stage 4 ([place]) 10/28/2016  . Goals of care, counseling/discussion 10/28/2016  . Upper airway cough syndrome 10/16/2016  . Multiple pulmonary nodules 10/15/2016    Past Surgical History:  Procedure Laterality Date  . BREAST EXCISIONAL BIOPSY Left 20+ yrs ago   benign  . PERICARDIAL WINDOW N/A 01/10/2017   Procedure: PERICARDIAL WINDOW- SUB XYPHOID, RIGHT CHEST TUBE;  Surgeon: Ivin Poot, MD;  Location: [facility];  Service: Thoracic;  Laterality: N/A;  . VIDEO BRONCHOSCOPY Bilateral 10/21/2016   Procedure: VIDEO BRONCHOSCOPY WITH FLUORO;  Surgeon: Tanda Rockers, MD;  Location: [facility];  Service: Cardiopulmonary;  Laterality: Bilateral;     OB History   None      Home Medications    Prior to Admission medications   Medication Sig Start Date End Date Taking? Authorizing Provider  afatinib dimaleate (GILOTRIF) 40 MG tablet Take 1 tablet (40 mg total) by mouth daily. Take on an empty stomach 1hr before or 2 hrs after meals. 02/18/17   Curt Bears, MD  ammonium lactate (LAC-HYDRIN) 12 % lotion Apply topically as needed for dry skin. 01/22/17   Rama, Venetia Maxon, MD  clindamycin (CLINDAGEL) 1 % gel Apply topically 2 (two) times daily. 11/20/16   Maryanna Shape, NP  dextromethorphan-guaiFENesin (MUCINEX DM) 30-600 MG 12hr tablet Take 1 tablet by mouth 2 (two) times daily as needed for cough. 01/22/17   Rama, Venetia Maxon, MD  furosemide (LASIX) 40 MG tablet Take 1 tablet (40 mg total)  by mouth daily. 03/06/17 06/04/17  Copland, Gay Filler, MD  loperamide (IMODIUM) 1 MG/5ML solution Take 2 mg as needed by mouth for diarrhea or loose stools.    [provider]  magnesium oxide (MAG-OX) 400 (241.3 Mg) MG tablet Take 1 tablet (400 mg total) by mouth daily. 01/23/17   Rama, Venetia Maxon, MD  methocarbamol (ROBAXIN) 500 MG tablet Take 1 tablet (500 mg total) by mouth every 8 (eight) hours as needed for muscle spasms. 04/01/17   Copland, Gay Filler, MD  Oxycodone HCl 10 MG  TABS Take 1 tablet (10 mg total) by mouth 3 (three) times daily as needed. 02/11/17   Copland, Gay Filler, MD  pantoprazole (PROTONIX) 40 MG tablet Take 1 tablet (40 mg total) by mouth daily at 12 noon. 03/06/17   Copland, Gay Filler, MD  potassium chloride SA (K-DUR,KLOR-CON) 20 MEQ tablet Take 1 tablet (20 mEq total) by mouth 2 (two) times daily. 03/06/17   Copland, Gay Filler, MD  prochlorperazine (COMPAZINE) 10 MG tablet Take 1 tablet (10 mg total) every 6 (six) hours as needed by mouth for nausea or vomiting. 12/05/16   Maryanna Shape, NP  senna-docusate (SENOKOT-S) 8.6-50 MG tablet Take 1 tablet by mouth at bedtime. 01/22/17   Rama, Venetia Maxon, MD    Family History Family History  Problem Relation Age of Onset  . Asthma Mother   . Stroke Mother   . Hypertension Mother   . Heart attack Father   . Hypertension Father   . Hyperlipidemia Father   . Dementia Father   . Emphysema Maternal Grandmother   . Hypertension Maternal Grandmother   . Colon cancer Paternal Grandmother     Social History Social History   Tobacco Use  . Smoking status: Never Smoker  . Smokeless tobacco: Never Used  Substance Use Topics  . Alcohol use: Yes    Comment: socially  . Drug use: No     Allergies   Percocet [oxycodone-acetaminophen]; Lisinopril; and Losartan   Review of Systems Review of Systems  Constitutional: Positive for fever. Negative for malaise/fatigue.  Respiratory: Negative for sputum production and shortness of breath.   Cardiovascular: Positive for palpitations. Negative for chest pain.  Gastrointestinal: Negative for abdominal pain.  All other systems reviewed and are negative.    Physical Exam Updated Vital Signs BP 104/69   Pulse 100   Temp 98.4 F (36.9 C) (Oral)   Resp 18   Ht 5' 6"  (1.676 m)   Wt 94.8 kg (209 lb)   LMP 03/15/2010   SpO2 99%   BMI 33.73 kg/m   Physical Exam  Constitutional: She is oriented to person, place, and time. She appears well-developed  and well-nourished. No distress.  HENT:  Head: Normocephalic and atraumatic.  Mouth/Throat: Oropharynx is clear and moist. No oropharyngeal exudate.  Neck: Normal range of motion. Neck supple.  Cardiovascular: Normal rate and regular rhythm. Exam reveals no gallop and no friction rub.  No murmur heard. Pulmonary/Chest: Effort normal and breath sounds normal. No respiratory distress. She has no wheezes.  Abdominal: Soft. Bowel sounds are normal. She exhibits no distension. There is no tenderness.  Musculoskeletal: Normal range of motion. She exhibits no edema.  Lymphadenopathy:    She has no cervical adenopathy.  Neurological: She is alert and oriented to person, place, and time.  Skin: Skin is warm and dry. She is not diaphoretic.  Nursing note and vitals reviewed.    ED Treatments / Results  Labs (all labs  ordered are listed, but only abnormal results are displayed) Labs Reviewed  BASIC METABOLIC PANEL - Abnormal; Notable for the following components:      Result Value   Glucose, Bld 106 (*)    Creatinine, Ser 1.14 (*)    GFR calc non Af Amer 55 (*)    All other components within normal limits  CBC - Abnormal; Notable for the following components:   WBC 11.5 (*)    All other components within normal limits  CULTURE, BLOOD (ROUTINE X 2)  CULTURE, BLOOD (ROUTINE X 2)  I-STAT TROPONIN, ED  I-STAT CG4 LACTIC ACID, ED  I-STAT CG4 LACTIC ACID, ED    EKG EKG Interpretation  Date/Time:  Monday Jun 08 2017 21:03:48 EDT Ventricular Rate:  99 PR Interval:  158 QRS Duration: 84 QT Interval:  342 QTC Calculation: 438 R Axis:   49 Text Interpretation:  Normal sinus rhythm Biatrial enlargement Nonspecific T wave abnormality Abnormal ECG Confirmed by Veryl Speak [phone]) on 06/08/2017 11:24:33 PM   Radiology Dg Chest 2 View  Result Date: 06/08/2017 CLINICAL DATA:  Fever, cough and palpitations. EXAM: CHEST - 2 VIEW COMPARISON:  Chest CT 03/17/2016 FINDINGS: Normal  cardiomediastinal contours. Mild interstitial pulmonary edema. No pleural effusion or pneumothorax. No focal airspace consolidation. IMPRESSION: Mild interstitial pulmonary edema. Electronically Signed   By: Ulyses Jarred M.D.   On: 06/08/2017 21:48    Procedures Procedures (including critical care time)  Medications Ordered in ED Medications - No data to display   Initial Impression / Assessment and Plan / ED Course  I have reviewed the triage vital signs and the nursing notes.  Pertinent labs & imaging results that were available during my care of the patient were reviewed by me and considered in my medical decision making (see chart for details).  Patient with history of stage IV metastatic lung carcinoma currently undergoing therapy with oral chemo.  She presents with elevated heart rate and uri symptoms as described in the HPI.  Her work-up reveals no obvious laboratory abnormality.  Her EKG shows a sinus rhythm with no acute changes.  Chest x-ray raises the suspicion of possible mild heart failure, however this does not fit the clinical picture.  There are no rales on exam and she has no pedal edema.  She does take Lasix 40 mg once daily and I will have her increase this to twice daily.  I will also treat with doxycycline for presumed bronchitis.  Repeat EKG confirms a persistent sinus rhythm with no new abnormality.  Final Clinical Impressions(s) / ED Diagnoses   Final diagnoses:  None    ED Discharge Orders    None       Veryl Speak, MD 06/08/17 2346    Veryl Speak, MD 06/09/17 [phone]

## 2017-06-08 NOTE — Telephone Encounter (Signed)
Mucinex, warm liquid and call back if there is any fever or chills.

## 2017-06-08 NOTE — ED Triage Notes (Addendum)
Pt transported from home by EMS c/o palpitations onset about 11am, denies CP, denies shob. Pt is chemo patient, stage 4 Lung CA. Pt tearful on arrival. Unable to est IV

## 2017-06-08 NOTE — Telephone Encounter (Signed)
5 days ago started with sore throat , worse , NP cough , slight hoarsness - Denies fever , pulse ox 96%. Taking muciinex.

## 2017-06-09 MED ORDER — DOXYCYCLINE HYCLATE 100 MG PO CAPS: 100.0000 mg | ORAL_CAPSULE | Freq: Two times a day (BID) | ORAL | 0 refills | Status: DC

## 2017-06-09 NOTE — Telephone Encounter (Addendum)
Pt notified. She called  after hours provider and  MD sent her to ED. Tachycardia, T=100.8,  Low Blood pressure. Started on antibiotics, CXR > fluid. Lasix increased for the next 3 days. She is back home.

## 2017-06-09 NOTE — Discharge Instructions (Addendum)
Doxycycline as prescribed.  Increase your Lasix to 40 mg twice daily for the next 3 days.  Return to the emergency department if symptoms significantly worsen or change.

## 2017-06-13 LAB — CULTURE, BLOOD (ROUTINE X 2)
Culture: NO GROWTH
Special Requests: ADEQUATE

## 2017-06-23 ENCOUNTER — Other Ambulatory Visit: Payer: Self-pay | Admitting: Medical Oncology

## 2017-06-23 DIAGNOSIS — C3492 Malignant neoplasm of unspecified part of left bronchus or lung: Secondary | ICD-10-CM

## 2017-06-23 MED ORDER — AFATINIB DIMALEATE 40 MG PO TABS: 40.0000 mg | ORAL_TABLET | Freq: Every day | ORAL | 0 refills | Status: DC

## 2017-06-26 ENCOUNTER — Inpatient Hospital Stay: Payer: 59 | Attending: Oncology

## 2017-06-26 ENCOUNTER — Ambulatory Visit (HOSPITAL_COMMUNITY)
Admission: RE | Admit: 2017-06-26 | Discharge: 2017-06-26 | Disposition: A | Discharge status: Home / Self Care | Payer: 59 | Source: Ambulatory Visit | Attending: Internal Medicine | Admitting: Internal Medicine

## 2017-06-26 ENCOUNTER — Encounter (HOSPITAL_COMMUNITY): Payer: Self-pay

## 2017-06-26 DIAGNOSIS — M899 Disorder of bone, unspecified: Secondary | ICD-10-CM | POA: Insufficient documentation

## 2017-06-26 DIAGNOSIS — J90 Pleural effusion, not elsewhere classified: Secondary | ICD-10-CM | POA: Insufficient documentation

## 2017-06-26 DIAGNOSIS — C3490 Malignant neoplasm of unspecified part of unspecified bronchus or lung: Secondary | ICD-10-CM | POA: Diagnosis present

## 2017-06-26 DIAGNOSIS — R918 Other nonspecific abnormal finding of lung field: Secondary | ICD-10-CM | POA: Diagnosis not present

## 2017-06-26 LAB — CMP (CANCER CENTER ONLY)
ALT: 19 U/L (ref 0–55)
AST: 23 U/L (ref 5–34)
Albumin: 3.6 g/dL (ref 3.5–5.0)
Alkaline Phosphatase: 110 U/L (ref 40–150)
Anion gap: 9 (ref 3–11)
BUN: 19 mg/dL (ref 7–26)
CO2: 29 mmol/L (ref 22–29)
Calcium: 9.3 mg/dL (ref 8.4–10.4)
Chloride: 103 mmol/L (ref 98–109)
Creatinine: 0.94 mg/dL (ref 0.60–1.10)
GFR, Est AFR Am: >60 mL/min (ref >60)
GFR, Estimated: >60 mL/min (ref >60)
Glucose, Bld: 77 mg/dL (ref 70–140)
Potassium: 4.3 mmol/L (ref 3.5–5.1)
Sodium: 141 mmol/L (ref 136–145)
Total Bilirubin: 0.3 mg/dL (ref 0.2–1.2)
Total Protein: 7 g/dL (ref 6.4–8.3)

## 2017-06-26 LAB — CBC WITH DIFFERENTIAL (CANCER CENTER ONLY)
Basophils Absolute: 0.1 10*3/uL (ref 0.0–0.1)
Basophils Relative: 1 %
Eosinophils Absolute: 0.3 10*3/uL (ref 0.0–0.5)
Eosinophils Relative: 3 %
HCT: 37.4 % (ref 34.8–46.6)
Hemoglobin: 12.1 g/dL (ref 11.6–15.9)
Lymphocytes Relative: 22 %
Lymphs Abs: 2.1 10*3/uL (ref 0.9–3.3)
MCH: 28.7 pg (ref 25.1–34.0)
MCHC: 32.4 g/dL (ref 31.5–36.0)
MCV: 88.7 fL (ref 79.5–101.0)
Monocytes Absolute: 0.8 10*3/uL (ref 0.1–0.9)
Monocytes Relative: 8 %
Neutro Abs: 6.2 10*3/uL (ref 1.5–6.5)
Neutrophils Relative %: 66 %
Platelet Count: 304 10*3/uL (ref 145–400)
RBC: 4.22 MIL/uL (ref 3.70–5.45)
RDW: 15.6 % — ABNORMAL HIGH (ref 11.2–14.5)
WBC Count: 9.4 10*3/uL (ref 3.9–10.3)

## 2017-06-26 MED ORDER — IOPAMIDOL (ISOVUE-300) INJECTION 61%
INTRAVENOUS | Status: AC
  Filled 2017-06-26: qty 100

## 2017-06-26 MED ORDER — IOPAMIDOL (ISOVUE-300) INJECTION 61%
100.0000 mL | Freq: Once | INTRAVENOUS | Status: AC | PRN
Start: 2017-06-26 — End: 2017-06-26
  Administered 2017-06-26: 100 mL via INTRAVENOUS

## 2017-06-30 ENCOUNTER — Encounter: Payer: Self-pay | Admitting: Oncology

## 2017-06-30 ENCOUNTER — Inpatient Hospital Stay: Payer: 59 | Attending: Oncology | Admitting: Oncology

## 2017-06-30 ENCOUNTER — Telehealth: Payer: Self-pay

## 2017-06-30 VITALS — BP 119/72 | HR 70 | Temp 98.5°F | Resp 17 | Ht 66.0 in | Wt 210.4 lb

## 2017-06-30 DIAGNOSIS — C3492 Malignant neoplasm of unspecified part of left bronchus or lung: Secondary | ICD-10-CM | POA: Diagnosis not present

## 2017-06-30 DIAGNOSIS — Z5111 Encounter for antineoplastic chemotherapy: Secondary | ICD-10-CM

## 2017-06-30 NOTE — Progress Notes (Signed)
[facility] OFFICE PROGRESS NOTE  Copland, Gay Filler, MD [address] [place] [address]  DIAGNOSIS: stage IV (T3, N2, M1 a) non-small cell lung cancer, poorly differentiated adenocarcinoma with extensive miliary distribution in the lungs bilaterally diagnosed in September 2018. 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 NEGATIVE for the Exon 20 T790M mutation  PRIOR THERAPY: None  CURRENT THERAPY: Gilotrif 40 mg daily started 11/08/2016.Status post5month of treatment.  INTERVAL HISTORY: Nancy EvesBest 52y.o. female returns for routine follow-up visit accompanied by her sister.  The patient is feeling fine today and has no specific complaints.  Since her last visit, the patient was evaluated in the emergency room for an elevated heart rate and a low blood pressure.  She was also noted to have a low-grade fever.  Chest x-ray did not show pneumonia but she was given antibiotics for presumed bronchitis.  Her symptoms have now resolved.  The patient denies fevers and chills.  Denies chest pain, shortness of breath, cough, hemoptysis.  Denies nausea, vomiting, constipation.  She has intermittent diarrhea which is much improved.  She uses Imodium once a week.  Denies skin rashes.  The patient is here for evaluation and discussion of her recent restaging CT scan results.  MEDICAL HISTORY: Past Medical History:  Diagnosis Date  . Adenocarcinoma of left lung, stage 4 ([facility] 10/28/2016  . Goals of care, counseling/discussion 10/28/2016  . Hypertension     ALLERGIES:  is allergic to percocet [oxycodone-acetaminophen]; lisinopril; and losartan.  MEDICATIONS:  Current Outpatient Medications  Medication Sig Dispense Refill  . afatinib dimaleate (GILOTRIF) 40 MG tablet Take 1 tablet (40 mg total) by mouth daily. Take on an empty stomach 1hr before or 2 hrs after meals. 30 tablet 0  . ammonium lactate (LAC-HYDRIN) 12 % lotion Apply topically as needed for dry skin.  400 g 0  . clindamycin (CLINDAGEL) 1 % gel Apply topically 2 (two) times daily. 30 g 1  . dextromethorphan-guaiFENesin (MUCINEX DM) 30-600 MG 12hr tablet Take 1 tablet by mouth 2 (two) times daily as needed for cough.    . doxycycline (VIBRAMYCIN) 100 MG capsule Take 1 capsule (100 mg total) by mouth 2 (two) times daily. One po bid x 7 days 14 capsule 0  . furosemide (LASIX) 40 MG tablet Take 1 tablet (40 mg total) by mouth daily. 90 tablet 3  . loperamide (IMODIUM) 1 MG/5ML solution Take 2 mg as needed by mouth for diarrhea or loose stools.    . magnesium oxide (MAG-OX) 400 (241.3 Mg) MG tablet Take 1 tablet (400 mg total) by mouth daily.    . methocarbamol (ROBAXIN) 500 MG tablet Take 1 tablet (500 mg total) by mouth every 8 (eight) hours as needed for muscle spasms. 30 tablet 1  . Oxycodone HCl 10 MG TABS Take 1 tablet (10 mg total) by mouth 3 (three) times daily as needed. 90 tablet 0  . pantoprazole (PROTONIX) 40 MG tablet Take 1 tablet (40 mg total) by mouth daily at 12 noon. 90 tablet 1  . potassium chloride SA (K-DUR,KLOR-CON) 20 MEQ tablet Take 1 tablet (20 mEq total) by mouth 2 (two) times daily. 180 tablet 1  . prochlorperazine (COMPAZINE) 10 MG tablet Take 1 tablet (10 mg total) every 6 (six) hours as needed by mouth for nausea or vomiting. 30 tablet 1  . senna-docusate (SENOKOT-S) 8.6-50 MG tablet Take 1 tablet by mouth at bedtime.     No current facility-administered  medications for this visit.     SURGICAL HISTORY:  Past Surgical History:  Procedure Laterality Date  . BREAST EXCISIONAL BIOPSY Left 20+ yrs ago   benign  . PERICARDIAL WINDOW N/A 01/10/2017   Procedure: PERICARDIAL WINDOW- SUB XYPHOID, RIGHT CHEST TUBE;  Surgeon: Ivin Poot, MD;  Location: [facility];  Service: Thoracic;  Laterality: N/A;  . VIDEO BRONCHOSCOPY Bilateral 10/21/2016   Procedure: VIDEO BRONCHOSCOPY WITH FLUORO;  Surgeon: Tanda Rockers, MD;  Location: [facility];  Service: Cardiopulmonary;   Laterality: Bilateral;    REVIEW OF SYSTEMS:   Review of Systems  Constitutional: Negative for appetite change, chills, fatigue, fever and unexpected weight change.  HENT:   Negative for mouth sores, nosebleeds, sore throat and trouble swallowing.   Eyes: Negative for eye problems and icterus.  Respiratory: Negative for cough, hemoptysis, shortness of breath and wheezing.   Cardiovascular: Negative for chest pain and leg swelling.  Gastrointestinal: Negative for abdominal pain, constipation, nausea and vomiting. She has intermittent diarrhea about once a week which is controlled with Imodium. Genitourinary: Negative for bladder incontinence, difficulty urinating, dysuria, frequency and hematuria.   Musculoskeletal: Negative for back pain, gait problem, neck pain and neck stiffness.  Skin: Negative for itching and rash.  Neurological: Negative for dizziness, extremity weakness, gait problem, headaches, light-headedness and seizures.  Hematological: Negative for adenopathy. Does not bruise/bleed easily.  Psychiatric/Behavioral: Negative for confusion, depression and sleep disturbance. The patient is not nervous/anxious.     PHYSICAL EXAMINATION:  Blood pressure 119/72, pulse 70, temperature 98.5 F (36.9 C), temperature source Oral, resp. rate 17, height '5\' 6"'  (1.676 m), weight 210 lb 6.4 oz (95.4 kg), last menstrual period 03/15/2010, SpO2 95 %.  ECOG PERFORMANCE STATUS: 1 - Symptomatic but completely ambulatory  Physical Exam  Constitutional: Oriented to person, place, and time and well-developed, well-nourished, and in no distress. No distress.  HENT:  Head: Normocephalic and atraumatic.  Mouth/Throat: Oropharynx is clear and moist. No oropharyngeal exudate.  Eyes: Conjunctivae are normal. Right eye exhibits no discharge. Left eye exhibits no discharge. No scleral icterus.  Neck: Normal range of motion. Neck supple.  Cardiovascular: Normal rate, regular rhythm, normal heart sounds  and intact distal pulses.   Pulmonary/Chest: Effort normal and breath sounds normal. No respiratory distress. No wheezes. No rales.  Abdominal: Soft. Bowel sounds are normal. Exhibits no distension and no mass. There is no tenderness.  Musculoskeletal: Normal range of motion. Exhibits no edema.  Lymphadenopathy:    No cervical adenopathy.  Neurological: Alert and oriented to person, place, and time. Exhibits normal muscle tone. Gait normal. Coordination normal.  Skin: Skin is warm and dry. No rash noted. Not diaphoretic. No erythema. No pallor.  Psychiatric: Mood, memory and judgment normal.  Vitals reviewed.  LABORATORY DATA: Lab Results  Component Value Date   WBC 9.4 06/26/2017   HGB 12.1 06/26/2017   HCT 37.4 06/26/2017   MCV 88.7 06/26/2017   PLT 304 06/26/2017      Chemistry      Component Value Date/Time   NA 141 06/26/2017 1112   NA 135 (L) 01/08/2017 1055   K 4.3 06/26/2017 1112   K 3.2 (L) 01/08/2017 1055   CL 103 06/26/2017 1112   CO2 29 06/26/2017 1112   CO2 25 01/08/2017 1055   BUN 19 06/26/2017 1112   BUN 14.6 01/08/2017 1055   CREATININE 0.94 06/26/2017 1112   CREATININE 1.3 (H) 01/08/2017 1055      Component Value Date/Time  CALCIUM 9.3 06/26/2017 1112   CALCIUM 9.5 01/08/2017 1055   ALKPHOS 110 06/26/2017 1112   ALKPHOS 82 01/08/2017 1055   AST 23 06/26/2017 1112   AST 31 01/08/2017 1055   ALT 19 06/26/2017 1112   ALT 34 01/08/2017 1055   BILITOT 0.3 06/26/2017 1112   BILITOT 1.74 (H) 01/08/2017 1055       RADIOGRAPHIC STUDIES:  Dg Chest 2 View  Result Date: 06/08/2017 CLINICAL DATA:  Fever, cough and palpitations. EXAM: CHEST - 2 VIEW COMPARISON:  Chest CT 03/17/2016 FINDINGS: Normal cardiomediastinal contours. Mild interstitial pulmonary edema. No pleural effusion or pneumothorax. No focal airspace consolidation. IMPRESSION: Mild interstitial pulmonary edema. Electronically Signed   By: Ulyses Jarred M.D.   On: 06/08/2017 21:48   Ct Chest W  Contrast  Result Date: 06/26/2017 CLINICAL DATA:  Patient with history of metastatic lung cancer. Follow-up exam. EXAM: CT CHEST, ABDOMEN, AND PELVIS WITH CONTRAST TECHNIQUE: Multidetector CT imaging of the chest, abdomen and pelvis was performed following the standard protocol during bolus administration of intravenous contrast. CONTRAST:  186m ISOVUE-300 IOPAMIDOL (ISOVUE-300) INJECTION 61% COMPARISON:  CT CAP 03/17/2017 FINDINGS: CT CHEST FINDINGS Cardiovascular: Normal heart size. Small pericardial effusion. Aorta and main pulmonary artery normal in caliber. Mediastinum/Nodes: No axillary lymphadenopathy. Slight interval increase in size of prevascular lymph node measuring 0.9 cm (image 18; series 2), previously 0.7 cm. No hilar adenopathy. Lungs/Pleura: Central airways are patent. Interval decrease in size of now trace left pleural effusion. Re demonstrated bilateral interstitial thickening, nodularity and ground-glass opacities throughout the lungs bilaterally. Similar appearance bandlike density within the superior segment left lower lobe (image 43; series 4). No pneumothorax. 22 Musculoskeletal: Similar-appearing sclerotic lesion left aspect T3 vertebral body (image 28; series 4). CT ABDOMEN PELVIS FINDINGS Hepatobiliary: Liver is normal in size and contour. No focal lesion is identified. Gallbladder is unremarkable. No intrahepatic or extrahepatic biliary ductal dilatation. Pancreas: Unremarkable Spleen: Unremarkable Adrenals/Urinary Tract: Adrenal glands are normal. Kidneys are symmetric in size. Extrarenal pelvis bilaterally. Urinary bladder is unremarkable. Stomach/Bowel: Stool throughout the colon. No abnormal bowel wall thickening or evidence for bowel obstruction. No free fluid or free intraperitoneal air. Vascular/Lymphatic: Normal caliber abdominal aorta. Peripheral calcified atherosclerotic plaque. No retroperitoneal lymphadenopathy. Reproductive: Status post hysterectomy. Adnexal structures are  unremarkable. Other: None. Musculoskeletal: No aggressive or acute appearing osseous lesions. IMPRESSION: 1. Bilateral pulmonary nodularity and associated ground-glass opacities appear mildly increased when compared to prior exam. 2. Mild increased prevascular lymph node. 3. Similar-appearing bandlike consolidation within the superior segment left lower lobe. 4. Unchanged sclerotic lesion within the left aspect of the T3 vertebral body. 5. Interval decrease in small left pleural effusion. Electronically Signed   By: DLovey NewcomerM.D.   On: 06/26/2017 16:55   Ct Abdomen Pelvis W Contrast  Result Date: 06/26/2017 CLINICAL DATA:  Patient with history of metastatic lung cancer. Follow-up exam. EXAM: CT CHEST, ABDOMEN, AND PELVIS WITH CONTRAST TECHNIQUE: Multidetector CT imaging of the chest, abdomen and pelvis was performed following the standard protocol during bolus administration of intravenous contrast. CONTRAST:  1078mISOVUE-300 IOPAMIDOL (ISOVUE-300) INJECTION 61% COMPARISON:  CT CAP 03/17/2017 FINDINGS: CT CHEST FINDINGS Cardiovascular: Normal heart size. Small pericardial effusion. Aorta and main pulmonary artery normal in caliber. Mediastinum/Nodes: No axillary lymphadenopathy. Slight interval increase in size of prevascular lymph node measuring 0.9 cm (image 18; series 2), previously 0.7 cm. No hilar adenopathy. Lungs/Pleura: Central airways are patent. Interval decrease in size of now trace left pleural effusion. Re demonstrated bilateral interstitial  thickening, nodularity and ground-glass opacities throughout the lungs bilaterally. Similar appearance bandlike density within the superior segment left lower lobe (image 43; series 4). No pneumothorax. 22 Musculoskeletal: Similar-appearing sclerotic lesion left aspect T3 vertebral body (image 28; series 4). CT ABDOMEN PELVIS FINDINGS Hepatobiliary: Liver is normal in size and contour. No focal lesion is identified. Gallbladder is unremarkable. No  intrahepatic or extrahepatic biliary ductal dilatation. Pancreas: Unremarkable Spleen: Unremarkable Adrenals/Urinary Tract: Adrenal glands are normal. Kidneys are symmetric in size. Extrarenal pelvis bilaterally. Urinary bladder is unremarkable. Stomach/Bowel: Stool throughout the colon. No abnormal bowel wall thickening or evidence for bowel obstruction. No free fluid or free intraperitoneal air. Vascular/Lymphatic: Normal caliber abdominal aorta. Peripheral calcified atherosclerotic plaque. No retroperitoneal lymphadenopathy. Reproductive: Status post hysterectomy. Adnexal structures are unremarkable. Other: None. Musculoskeletal: No aggressive or acute appearing osseous lesions. IMPRESSION: 1. Bilateral pulmonary nodularity and associated ground-glass opacities appear mildly increased when compared to prior exam. 2. Mild increased prevascular lymph node. 3. Similar-appearing bandlike consolidation within the superior segment left lower lobe. 4. Unchanged sclerotic lesion within the left aspect of the T3 vertebral body. 5. Interval decrease in small left pleural effusion. Electronically Signed   By: Lovey Newcomer M.D.   On: 06/26/2017 16:55     ASSESSMENT/PLAN:  Adenocarcinoma of left lung, stage 4 (HCC) This is a very pleasant 52 year old African-American female with metastatic non-small cell lung cancer, adenocarcinoma and positive for EGFR mutation with deletion in exon 19.  The patient is currently on treatment with GILOTRIF 40 mg p.o. daily status post 7 months.  The patient continues to tolerate this treatment well with no concerning complaints. She had a restaging CT scan of the chest, abdomen, pelvis and is here to discuss the results.  The patient was seen with Dr. Julien Nordmann.  CT scan results were discussed with the patient and her sister which show groundglass opacities bilaterally.  Her skin is otherwise stable and shows no evidence of disease progression.  We discussed with the patient and her  sister that the groundglass opacities could be related to her recent bronchitis/inflammation.  We will continue to watch this on future scans. Recommend she continue treatment with Gilotrif 40 mg daily. The patient will follow-up in approximately 1 month for evaluation and repeat lab work.  The patient was advised to call immediately if she has any concerning symptoms in the interval. The patient voices understanding of current disease status and treatment options and is in agreement with the current care plan. All questions were answered. The patient knows to call the clinic with any problems, questions or concerns. We can certainly see the patient much sooner if necessary.   Orders Placed This Encounter  Procedures  . CBC with Differential (Cancer Center Only)    Standing Status:   Future    Standing Expiration Date:   07/01/2018  . CMP ([facility] only)    Standing Status:   Future    Standing Expiration Date:   07/01/2018   Mikey Bussing, DNP, AGPCNP-BC, AOCNP 06/30/17  ADDENDUM: Hematology/Oncology Attending: I had a face-to-face encounter with the patient.  I recommended her care plan.  This is a very pleasant 52 years old African-American female with metastatic non-small cell lung cancer, adenocarcinoma with positive EGFR mutation with deletion in exon 54.  She is currently undergoing treatment with GILOTRIF 40 mg p.o. daily status post 7 months.  The patient continues to tolerate this treatment fairly well with very mild skin rash and no significant episodes of  diarrhea. She had repeat CT scan of the chest, abdomen and pelvis performed recently.  I personally and independently reviewed the scan results and discussed it with the patient today.  Her scan showed no concerning findings for disease progression but there was some groundglass opacities bilaterally that need close observation on upcoming imaging studies. I recommended for the patient to continue her current treatment with  GILOTRIF with the same dose. We will see her back for follow-up visit in 1 month for evaluation and repeat blood work. The patient was advised to call immediately if she has any concerning symptoms in the interval.  Disclaimer: This note was dictated with voice recognition software. Similar sounding words can inadvertently be transcribed and may be missed upon review. Eilleen Kempf, MD 07/01/17

## 2017-06-30 NOTE — Telephone Encounter (Signed)
Printed avs and calender of upcoming appointment. Per 6/4 los 

## 2017-06-30 NOTE — Assessment & Plan Note (Addendum)
This is a very pleasant 52 year old African-American female with metastatic non-small cell lung cancer, adenocarcinoma and positive for EGFR mutation with deletion in exon 91.  The patient is currently on treatment with GILOTRIF 40 mg p.o. daily status post 7 months.  The patient continues to tolerate this treatment well with no concerning complaints. She had a restaging CT scan of the chest, abdomen, pelvis and is here to discuss the results.  The patient was seen with Dr. Julien Nordmann.  CT scan results were discussed with the patient and her sister which show groundglass opacities bilaterally.  Her skin is otherwise stable and shows no evidence of disease progression.  We discussed with the patient and her sister that the groundglass opacities could be related to her recent bronchitis/inflammation.  We will continue to watch this on future scans. Recommend she continue treatment with Gilotrif 40 mg daily. The patient will follow-up in approximately 1 month for evaluation and repeat lab work.  The patient was advised to call immediately if she has any concerning symptoms in the interval. The patient voices understanding of current disease status and treatment options and is in agreement with the current care plan. All questions were answered. The patient knows to call the clinic with any problems, questions or concerns. We can certainly see the patient much sooner if necessary.

## 2017-07-07 MED FILL — PANTOPRAZOLE SOD DR 40 MG T: 40 | 30 days supply | Qty: 30 | Fill #1

## 2017-07-07 MED FILL — POTASSIUM CL ER 20 MEQ TABL: 20 | 30 days supply | Qty: 60 | Fill #1

## 2017-07-07 MED FILL — FUROSEMIDE 40 MG TAB: 40 | 30 days supply | Qty: 30 | Fill #1

## 2017-07-14 ENCOUNTER — Other Ambulatory Visit: Payer: Self-pay | Admitting: Medical Oncology

## 2017-07-14 DIAGNOSIS — C3492 Malignant neoplasm of unspecified part of left bronchus or lung: Secondary | ICD-10-CM

## 2017-07-14 MED ORDER — AFATINIB DIMALEATE 40 MG PO TABS: 40.0000 mg | ORAL_TABLET | Freq: Every day | ORAL | 2 refills | Status: DC

## 2017-07-24 NOTE — Assessment & Plan Note (Addendum)
This is a very pleasant 51 year old African-American female with metastatic non-small cell lung cancer, adenocarcinoma and positive for EGFR mutation with deletion in exon 19. The patient is currently on treatment with GILOTRIF 40 mg p.o. daily status post 8months.  The patient continues to tolerate this treatment well with no concerning complaints. Recommend for her to continue Gilotrif 40 mg daily. She will follow-up in approximately 1 month for evaluation and repeat lab work.  The patient was advised to call immediately if she has any concerning symptoms in the interval. The patient voices understanding of current disease status and treatment options and is in agreement with the current care plan. All questions were answered. The patient knows to call the clinic with any problems, questions or concerns. We can certainly see the patient much sooner if necessary.  

## 2017-07-24 NOTE — Progress Notes (Signed)
[facility] OFFICE PROGRESS NOTE  Copland, Gay Filler, MD [address] [place] [address]  DIAGNOSIS: stage IV (T3, N2, M1 a) non-small cell lung cancer, poorly differentiated adenocarcinoma with extensive miliary distribution in the lungs bilaterally diagnosed in September 2018. POSITIVE for an Exon 19 deletion mutation.NEGATIVE for the Exon 20 T790M mutation  PRIOR THERAPY: None  CURRENT THERAPY: Gilotrif 40 mg daily started 11/08/2016.Status post32month of treatment.  INTERVAL HISTORY: [name] 52y.o. female returns for routine follow-up visit by herself.  The patient is feeling fine today and has no specific complaints.  She denies fevers and chills.  Denies chest pain, shortness breath, cough, hemoptysis.  Denies nausea, vomiting, constipation, diarrhea.  Denies skin rashes.  Denies recent weight loss or night sweats.  She continues to tolerate treatment with Gilotrif fairly well.  The patient is here for evaluation and repeat lab work.  MEDICAL HISTORY: Past Medical History:  Diagnosis Date  . Adenocarcinoma of left lung, stage 4 ([facility] 10/28/2016  . Goals of care, counseling/discussion 10/28/2016  . Hypertension     ALLERGIES:  is allergic to percocet [oxycodone-acetaminophen]; lisinopril; and losartan.  MEDICATIONS:  Current Outpatient Medications  Medication Sig Dispense Refill  . afatinib dimaleate (GILOTRIF) 40 MG tablet Take 1 tablet (40 mg total) by mouth daily. Take on an empty stomach 1hr before or 2 hrs after meals. 30 tablet 2  . ammonium lactate (LAC-HYDRIN) 12 % lotion Apply topically as needed for dry skin. 400 g 0  . clindamycin (CLINDAGEL) 1 % gel Apply topically 2 (two) times daily. 30 g 1  . dextromethorphan-guaiFENesin (MUCINEX DM) 30-600 MG 12hr tablet Take 1 tablet by mouth 2 (two) times daily as needed for cough.    . loperamide (IMODIUM) 1 MG/5ML solution Take 2 mg as needed by mouth for diarrhea or loose stools.     . magnesium oxide (MAG-OX) 400 (241.3 Mg) MG tablet Take 1 tablet (400 mg total) by mouth daily.    . methocarbamol (ROBAXIN) 500 MG tablet Take 1 tablet (500 mg total) by mouth every 8 (eight) hours as needed for muscle spasms. 30 tablet 1  . Oxycodone HCl 10 MG TABS Take 1 tablet (10 mg total) by mouth 3 (three) times daily as needed. 90 tablet 0  . pantoprazole (PROTONIX) 40 MG tablet Take 1 tablet (40 mg total) by mouth daily at 12 noon. 90 tablet 1  . potassium chloride SA (K-DUR,KLOR-CON) 20 MEQ tablet Take 1 tablet (20 mEq total) by mouth 2 (two) times daily. 180 tablet 1  . prochlorperazine (COMPAZINE) 10 MG tablet Take 1 tablet (10 mg total) every 6 (six) hours as needed by mouth for nausea or vomiting. 30 tablet 1  . senna-docusate (SENOKOT-S) 8.6-50 MG tablet Take 1 tablet by mouth at bedtime.    . furosemide (LASIX) 40 MG tablet Take 1 tablet (40 mg total) by mouth daily. 90 tablet 3   No current facility-administered medications for this visit.     SURGICAL HISTORY:  Past Surgical History:  Procedure Laterality Date  . BREAST EXCISIONAL BIOPSY Left 20+ yrs ago   benign  . PERICARDIAL WINDOW N/A 01/10/2017   Procedure: PERICARDIAL WINDOW- SUB XYPHOID, RIGHT CHEST TUBE;  Surgeon: VIvin Poot MD;  [facility]  Service: Thoracic;  Laterality: N/A;  . VIDEO BRONCHOSCOPY Bilateral 10/21/2016   Procedure: VIDEO BRONCHOSCOPY WITH FLUORO;  Surgeon: WTanda Rockers MD;  Location: [facility];  Service: Cardiopulmonary;  Laterality: Bilateral;    REVIEW OF SYSTEMS:   Review of Systems  Constitutional: Negative for appetite change, chills, fatigue, fever and unexpected weight change.  HENT:   Negative for mouth sores, nosebleeds, sore throat and trouble swallowing.   Eyes: Negative for eye problems and icterus.  Respiratory: Negative for cough, hemoptysis, shortness of breath and wheezing.   Cardiovascular: Negative for chest pain and leg swelling.  Gastrointestinal:  Negative for abdominal pain, constipation, diarrhea, nausea and vomiting.  Genitourinary: Negative for bladder incontinence, difficulty urinating, dysuria, frequency and hematuria.   Musculoskeletal: Negative for back pain, gait problem, neck pain and neck stiffness.  Skin: Negative for itching and rash.  Neurological: Negative for dizziness, extremity weakness, gait problem, headaches, light-headedness and seizures.  Hematological: Negative for adenopathy. Does not bruise/bleed easily.  Psychiatric/Behavioral: Negative for confusion, depression and sleep disturbance. The patient is not nervous/anxious.     PHYSICAL EXAMINATION:  Blood pressure 117/68, pulse 71, temperature 98.4 F (36.9 C), temperature source Oral, resp. rate 18, height '5\' 6"'  (1.676 m), weight 217 lb 9.6 oz (98.7 kg), last menstrual period 03/15/2010, SpO2 100 %.  ECOG PERFORMANCE STATUS: 1 - Symptomatic but completely ambulatory  Physical Exam  Constitutional: Oriented to person, place, and time and well-developed, well-nourished, and in no distress. No distress.  HENT:  Head: Normocephalic and atraumatic.  Mouth/Throat: Oropharynx is clear and moist. No oropharyngeal exudate.  Eyes: Conjunctivae are normal. Right eye exhibits no discharge. Left eye exhibits no discharge. No scleral icterus.  Neck: Normal range of motion. Neck supple.  Cardiovascular: Normal rate, regular rhythm, normal heart sounds and intact distal pulses.   Pulmonary/Chest: Effort normal and breath sounds normal. No respiratory distress. No wheezes. No rales.  Abdominal: Soft. Bowel sounds are normal. Exhibits no distension and no mass. There is no tenderness.  Musculoskeletal: Normal range of motion. Exhibits no edema.  Lymphadenopathy:    No cervical adenopathy.  Neurological: Alert and oriented to person, place, and time. Exhibits normal muscle tone. Gait normal. Coordination normal.  Skin: Skin is warm and dry. No rash noted. Not diaphoretic. No  erythema. No pallor.  Psychiatric: Mood, memory and judgment normal.  Vitals reviewed.  LABORATORY DATA: Lab Results  Component Value Date   WBC 7.0 07/27/2017   HGB 11.4 (L) 07/27/2017   HCT 35.2 07/27/2017   MCV 89.2 07/27/2017   PLT 277 07/27/2017      Chemistry      Component Value Date/Time   NA 140 07/27/2017 1341   NA 135 (L) 01/08/2017 1055   K 4.3 07/27/2017 1341   K 3.2 (L) 01/08/2017 1055   CL 104 07/27/2017 1341   CO2 32 07/27/2017 1341   CO2 25 01/08/2017 1055   BUN 15 07/27/2017 1341   BUN 14.6 01/08/2017 1055   CREATININE 0.97 07/27/2017 1341   CREATININE 1.3 (H) 01/08/2017 1055      Component Value Date/Time   CALCIUM 9.3 07/27/2017 1341   CALCIUM 9.5 01/08/2017 1055   ALKPHOS 133 (H) 07/27/2017 1341   ALKPHOS 82 01/08/2017 1055   AST 21 07/27/2017 1341   AST 31 01/08/2017 1055   ALT 18 07/27/2017 1341   ALT 34 01/08/2017 1055   BILITOT 0.3 07/27/2017 1341   BILITOT 1.74 (H) 01/08/2017 1055       RADIOGRAPHIC STUDIES:  No results found.   ASSESSMENT/PLAN:  Adenocarcinoma of left lung, stage 4 (HCC) This is a very pleasant 52 year old African-American female with metastatic non-small cell lung  cancer, adenocarcinoma and positive for EGFR mutation with deletion in exon 19. The patient is currently on treatment with GILOTRIF 40 mg p.o. daily status post 82month.  The patient continues to tolerate this treatment well with no concerning complaints. Recommend for her to continue Gilotrif 40 mg daily. She will follow-up in approximately 1 month for evaluation and repeat lab work.  The patient was advised to call immediately if she has any concerning symptoms in the interval. The patient voices understanding of current disease status and treatment options and is in agreement with the current care plan. All questions were answered. The patient knows to call the clinic with any problems, questions or concerns. We can certainly see the patient much  sooner if necessary.   Orders Placed This Encounter  Procedures  . CBC with Differential (Cancer Center Only)    Standing Status:   Future    Standing Expiration Date:   07/28/2018  . CMP ([facility])    Standing Status:   Future    Standing Expiration Date:   07/28/2018   KMikey Bussing D[place] AGPCNP-BC, AOCNP 07/27/17

## 2017-07-27 ENCOUNTER — Encounter: Payer: Self-pay | Admitting: Oncology

## 2017-07-27 ENCOUNTER — Inpatient Hospital Stay: Payer: 59 | Attending: Oncology | Admitting: Oncology

## 2017-07-27 ENCOUNTER — Telehealth: Payer: Self-pay | Admitting: Internal Medicine

## 2017-07-27 ENCOUNTER — Inpatient Hospital Stay: Payer: 59

## 2017-07-27 VITALS — BP 117/68 | HR 71 | Temp 98.4°F | Resp 18 | Ht 66.0 in | Wt 217.6 lb

## 2017-07-27 DIAGNOSIS — C3492 Malignant neoplasm of unspecified part of left bronchus or lung: Secondary | ICD-10-CM | POA: Diagnosis not present

## 2017-07-27 DIAGNOSIS — Z5111 Encounter for antineoplastic chemotherapy: Secondary | ICD-10-CM

## 2017-07-27 DIAGNOSIS — Z79899 Other long term (current) drug therapy: Secondary | ICD-10-CM | POA: Insufficient documentation

## 2017-07-27 LAB — CMP (CANCER CENTER ONLY)
ALT: 18 U/L (ref 0–44)
AST: 21 U/L (ref 15–41)
Albumin: 3.7 g/dL (ref 3.5–5.0)
Alkaline Phosphatase: 133 U/L — ABNORMAL HIGH (ref 38–126)
Anion gap: 4 — ABNORMAL LOW (ref 5–15)
BUN: 15 mg/dL (ref 6–20)
CO2: 32 mmol/L (ref 22–32)
Calcium: 9.3 mg/dL (ref 8.9–10.3)
Chloride: 104 mmol/L (ref 98–111)
Creatinine: 0.97 mg/dL (ref 0.44–1.00)
GFR, Est AFR Am: >60 mL/min (ref >60)
GFR, Estimated: >60 mL/min (ref >60)
Glucose, Bld: 96 mg/dL (ref 70–99)
Potassium: 4.3 mmol/L (ref 3.5–5.1)
Sodium: 140 mmol/L (ref 135–145)
Total Bilirubin: 0.3 mg/dL (ref 0.3–1.2)
Total Protein: 6.8 g/dL (ref 6.5–8.1)

## 2017-07-27 LAB — CBC WITH DIFFERENTIAL (CANCER CENTER ONLY)
Basophils Absolute: 0 10*3/uL (ref 0.0–0.1)
Basophils Relative: 1 %
Eosinophils Absolute: 0.3 10*3/uL (ref 0.0–0.5)
Eosinophils Relative: 5 %
HCT: 35.2 % (ref 34.8–46.6)
Hemoglobin: 11.4 g/dL — ABNORMAL LOW (ref 11.6–15.9)
Lymphocytes Relative: 29 %
Lymphs Abs: 2 10*3/uL (ref 0.9–3.3)
MCH: 29 pg (ref 25.1–34.0)
MCHC: 32.5 g/dL (ref 31.5–36.0)
MCV: 89.2 fL (ref 79.5–101.0)
Monocytes Absolute: 0.7 10*3/uL (ref 0.1–0.9)
Monocytes Relative: 10 %
Neutro Abs: 3.9 10*3/uL (ref 1.5–6.5)
Neutrophils Relative %: 55 %
Platelet Count: 277 10*3/uL (ref 145–400)
RBC: 3.95 MIL/uL (ref 3.70–5.45)
RDW: 15.1 % — ABNORMAL HIGH (ref 11.2–14.5)
WBC Count: 7 10*3/uL (ref 3.9–10.3)

## 2017-07-27 NOTE — Telephone Encounter (Signed)
Appointments scheduled AVS/Calendar printed per 7/1 los

## 2017-08-07 MED FILL — FUROSEMIDE 40 MG TAB: 40 | 30 days supply | Qty: 30 | Fill #2

## 2017-08-07 MED FILL — PANTOPRAZOLE SOD DR 40 MG T: 40 | 30 days supply | Qty: 30 | Fill #2

## 2017-08-07 MED FILL — POTASSIUM CL ER 20 MEQ TAB: 20 | 30 days supply | Qty: 60 | Fill #2

## 2017-08-18 NOTE — Progress Notes (Signed)
[facility] [address] [place], [place] [address] [phone]  Date:  08/19/2017   Name:  Nancy Moore   DOB:  05/17/1965   MRN:  [id number]  PCP:  Darreld Mclean, MD    Chief Complaint: Follow-up (med refills)   History of Present Illness:  Nancy Moore is a 52 y.o. very pleasant female patient who presents with the following:  History of advanced lung cancer, here today to follow-up Her oncologist is Dr. Julien Nordmann  Oncology note 7/1: DIAGNOSIS: stage IV (T3, N2, M1 a) non-small cell lung cancer, poorly differentiated adenocarcinoma with extensive miliary distribution in the lungs bilaterally diagnosed in September 2018. POSITIVE for an Exon 19 deletion mutation.NEGATIVE for the Exon 20 T790M mutation PRIOR THERAPY: None CURRENT THERAPY: Gilotrif 40 mg daily started 11/08/2016.Status post31months of treatment. INTERVAL HISTORY: Nancy Moore 52 y.o. female returns for routine follow-up visit by herself.  The patient is feeling fine today and has no specific complaints.  She denies fevers and chills.  Denies chest pain, shortness breath, cough, hemoptysis.  Denies nausea, vomiting, constipation, diarrhea.  Denies skin rashes.  Denies recent weight loss or night sweats.  She continues to tolerate treatment with Gilotrif fairly well.  The patient is here for evaluation and repeat lab work.  I last saw her in March at which time she was doing well and responding very nicely to treatment, feeling much better She is doing well on her Gilotrif still   She has noted weight gain- seems to be a few lbs over the last couple of weeks.  She feels like her weight may go up for 3 lbs sometimes overnight and got worried about fluid retention.  She is back around her baseline weight prior to cancer dx now  Wt Readings from Last 3 Encounters:  08/19/17 222 lb (100.7 kg)  07/27/17 217 lb 9.6 oz (98.7 kg)  06/30/17 210 lb 6.4 oz  (95.4 kg)   She does daily weights at home  Her legs are not really swollen She is able to lie flat in bed and her breathing is ok  She is taking lasix 40 once a day  She is using oxycodone 10 mg for pain about twice a week still.  She needs a refill- last got an rx in January.  She is also using robaxin prn-  About twice a week as well   S/p hyst in 2007- this was done due to fibroids. No history of GYN cancers   NCCSR:   02/11/2017  1  02/11/2017  Oxycodone Hcl 10 Mg Tablet  90 30 Je Cop  [id number]  Med (5269)  1/1 45.00 MME Comm Ins  [place]  11/14/2016  1  10/29/2016  Tramadol Hcl 50 Mg Tablet  28 3 Mi Wer  [id number]  Mos (0906)  1/1 46.67 MME Comm Ins  [place]  11/07/2016  1  11/07/2016  Tramadol Hcl 50 Mg Tablet  28 3 Mi Wer  [id number]  Mos (0906)  1/1 46.67 MME Comm Ins  [place]  10/31/2016  1  10/31/2016  Lorazepam 0.5 Mg Tablet  1 1 Ab Moh  [id number]  Mos (0906)  1/1 0.50 LME Comm Ins  [place]  09/27/2016  1  09/27/2016  Hydromet Syrup  180 5 Al Lue  [id number]  Wal (3642)  1/1 36.00 MME Comm Ins  [place]  05/30/2016  1  01/23/2016  Phentermine 30 Mg Capsule  [facility]  [id number]  Wes (0126)  3/2       Patient Active Problem List   Diagnosis Date Noted  . Cardiac/pericardial tamponade   . Chest tube in place   . Acute lower UTI   . Pain   . Acute blood loss anemia   . Palliative care encounter   . AKI (acute kidney injury) ([facility]) 01/10/2017  . Hypotension 01/09/2017  . Essential hypertension 01/09/2017  . Hypokalemia 01/09/2017  . Dyspnea 01/09/2017  . Leukocytosis 01/09/2017  . Pleural effusion on right 01/08/2017  . Encounter for antineoplastic chemotherapy 11/20/2016  . Adenocarcinoma of left lung, stage 4 ([facility]) 10/28/2016  . Goals of care, counseling/discussion 10/28/2016  . Upper airway cough syndrome 10/16/2016  . Multiple pulmonary nodules 10/15/2016    Past Medical History:  Diagnosis Date  . Adenocarcinoma of left lung, stage 4 ([facility]) 10/28/2016  . Goals of care, counseling/discussion  10/28/2016  . Hypertension     Past Surgical History:  Procedure Laterality Date  . BREAST EXCISIONAL BIOPSY Left 20+ yrs ago   benign  . PERICARDIAL WINDOW N/A 01/10/2017   Procedure: PERICARDIAL WINDOW- SUB XYPHOID, RIGHT CHEST TUBE;  Surgeon: Ivin Poot, MD;  Location: [facility];  Service: Thoracic;  Laterality: N/A;  . VIDEO BRONCHOSCOPY Bilateral 10/21/2016   Procedure: VIDEO BRONCHOSCOPY WITH FLUORO;  Surgeon: Tanda Rockers, MD;  [facility];  Service: Cardiopulmonary;  Laterality: Bilateral;    Social History   Tobacco Use  . Smoking status: Never Smoker  . Smokeless tobacco: Never Used  Substance Use Topics  . Alcohol use: Yes    Comment: socially  . Drug use: No    Family History  Problem Relation Age of Onset  . Asthma Mother   . Stroke Mother   . Hypertension Mother   . Heart attack Father   . Hypertension Father   . Hyperlipidemia Father   . Dementia Father   . Emphysema Maternal Grandmother   . Hypertension Maternal Grandmother   . Colon cancer Paternal Grandmother     Allergies  Allergen Reactions  . Percocet [Oxycodone-Acetaminophen] Other (See Comments)    "makes me dizzy"  . Lisinopril Cough  . Losartan     Medication list has been reviewed and updated.  Current Outpatient Medications on File Prior to Visit  Medication Sig Dispense Refill  . afatinib dimaleate (GILOTRIF) 40 MG tablet Take 1 tablet (40 mg total) by mouth daily. Take on an empty stomach 1hr before or 2 hrs after meals. 30 tablet 2  . loperamide (IMODIUM) 1 MG/5ML solution Take 2 mg as needed by mouth for diarrhea or loose stools.    . magnesium oxide (MAG-OX) 400 (241.3 Mg) MG tablet Take 1 tablet (400 mg total) by mouth daily.    Marland Kitchen dextromethorphan-guaiFENesin (MUCINEX DM) 30-600 MG 12hr tablet Take 1 tablet by mouth 2 (two) times daily as needed for cough. (Patient not taking: Reported on 08/19/2017)    . prochlorperazine (COMPAZINE) 10 MG tablet Take 1 tablet (10 mg  total) every 6 (six) hours as needed by mouth for nausea or vomiting. (Patient not taking: Reported on 08/19/2017) 30 tablet 1  . senna-docusate (SENOKOT-S) 8.6-50 MG tablet Take 1 tablet by mouth at bedtime. (Patient not taking: Reported on 08/19/2017)     No current facility-administered medications on file prior to visit.     Review of Systems:  As per HPI- otherwise negative. No fever or chills  Physical Examination: Vitals:  08/19/17 1217  BP: 126/72  Pulse: 65  Resp: 14  Temp: 97.8 F (36.6 C)  SpO2: 98%   Vitals:   08/19/17 1217  Weight: 222 lb (100.7 kg)  Height: 5\' 6"  (1.676 m)   Body mass index is 35.83 kg/m. Ideal Body Weight: Weight in (lb) to have BMI = 25: 154.6  GEN: WDWN, NAD, Non-toxic, A & O x 3, looks well, obese HEENT: Atraumatic, Normocephalic. Neck supple. No masses, No LAD.  Bilateral TM wnl, oropharynx normal.  PEERL,EOMI.   Ears and Nose: No external deformity. CV: RRR, No M/G/R. No JVD. No thrill. No extra heart sounds. PULM: CTA B, no wheezes, crackles, rhonchi. No retractions. No resp. distress. No accessory muscle use. ABD: S, NT, ND. No rebound. No HSM. EXTR: No c/c/e NEURO Normal gait.  PSYCH: Normally interactive. Conversant. Not depressed or anxious appearing.  Calm demeanor.   Assessment and Plan: Hyponatremia  Muscle spasm - Plan: methocarbamol (ROBAXIN) 500 MG tablet  Adenocarcinoma of left lung, stage 4 (HCC) - Plan: Oxycodone HCl 10 MG TABS, DG Chest 2 View  Pain - Plan: Oxycodone HCl 10 MG TABS, CANCELED: Pain Mgmt, Profile 8 w/Conf, U  Dry skin - Plan: ammonium lactate (LAC-HYDRIN) 12 % lotion, clindamycin (CLINDAGEL) 1 % gel  Fluid retention - Plan: furosemide (LASIX) 40 MG tablet, potassium chloride SA (K-DUR,KLOR-CON) 20 MEQ tablet, B Nat Peptide  Gastroesophageal reflux disease, esophagitis presence not specified - Plan: pantoprazole (PROTONIX) 40 MG tablet  Following up today She has concern of fluid retention.  Her  chemo med can occasionally cause CHF Will obtain CXR and BNP today Refilled several meds for her today She does use oxycodone a couple of times a week for pain due to cancer with bone mets  Asked for a UDS but she was not able to urinate.  Will have her do next time  Meds ordered this encounter  Medications  . ammonium lactate (LAC-HYDRIN) 12 % lotion    Sig: Apply topically as needed for dry skin.    Dispense:  400 g    Refill:  0  . clindamycin (CLINDAGEL) 1 % gel    Sig: Apply topically 2 (two) times daily.    Dispense:  30 g    Refill:  1  . furosemide (LASIX) 40 MG tablet    Sig: Take 1 tablet (40 mg total) by mouth daily.    Dispense:  90 tablet    Refill:  3  . methocarbamol (ROBAXIN) 500 MG tablet    Sig: Take 1 tablet (500 mg total) by mouth every 8 (eight) hours as needed for muscle spasms.    Dispense:  30 tablet    Refill:  1  . Oxycodone HCl 10 MG TABS    Sig: Take 1 tablet (10 mg total) by mouth 3 (three) times daily as needed.    Dispense:  45 tablet    Refill:  0  . potassium chloride SA (K-DUR,KLOR-CON) 20 MEQ tablet    Sig: Take 1 tablet (20 mEq total) by mouth 2 (two) times daily.    Dispense:  180 tablet    Refill:  3  . pantoprazole (PROTONIX) 40 MG tablet    Sig: Take 1 tablet (40 mg total) by mouth daily at 12 noon.    Dispense:  90 tablet    Refill:  3    Signed Lamar Blinks, MD  Received her labs and x-ray   Dg Chest 2 View  Result Date: 08/19/2017  CLINICAL DATA:  History of stage IV lung cancer. Evaluate for fluid. Ongoing shortness of breath. EXAM: CHEST - 2 VIEW COMPARISON:  Chest x-rays dated 06/08/2017, 02/13/2017 and 01/22/2017. FINDINGS: Heart size and mediastinal contours are within normal limits. Coarse interstitial markings again noted bilaterally. No confluent opacity to suggest a developing pneumonia. No pleural effusions seen. No acute or suspicious osseous finding IMPRESSION: 1. Chronic interstitial prominence, similar to multiple  prior studies indicating either mild chronic interstitial edema or chronic interstitial lung disease. 2. No acute findings. No evidence of pneumonia or alveolar pulmonary edema. Electronically Signed   By: Franki Cabot M.D.   On: 08/19/2017 13:50   Results for orders placed or performed in visit on 08/19/17  B Nat Peptide  Result Value Ref Range   Pro B Natriuretic peptide (BNP) 101.0 (H) 0.0 - 100.0 pg/mL

## 2017-08-19 ENCOUNTER — Ambulatory Visit: Payer: 59 | Admitting: Family Medicine

## 2017-08-19 ENCOUNTER — Encounter: Payer: Self-pay | Admitting: Family Medicine

## 2017-08-19 ENCOUNTER — Ambulatory Visit (HOSPITAL_BASED_OUTPATIENT_CLINIC_OR_DEPARTMENT_OTHER)
Admission: RE | Admit: 2017-08-19 | Discharge: 2017-08-19 | Disposition: A | Discharge status: Home / Self Care | Payer: 59 | Source: Ambulatory Visit | Attending: Family Medicine | Admitting: Family Medicine

## 2017-08-19 VITALS — BP 126/72 | HR 65 | Temp 97.8°F | Resp 14 | Ht 66.0 in | Wt 222.0 lb

## 2017-08-19 DIAGNOSIS — R52 Pain, unspecified: Secondary | ICD-10-CM | POA: Diagnosis not present

## 2017-08-19 DIAGNOSIS — R609 Edema, unspecified: Secondary | ICD-10-CM | POA: Diagnosis not present

## 2017-08-19 DIAGNOSIS — J849 Interstitial pulmonary disease, unspecified: Secondary | ICD-10-CM | POA: Diagnosis not present

## 2017-08-19 DIAGNOSIS — C3492 Malignant neoplasm of unspecified part of left bronchus or lung: Secondary | ICD-10-CM | POA: Insufficient documentation

## 2017-08-19 DIAGNOSIS — M62838 Other muscle spasm: Secondary | ICD-10-CM

## 2017-08-19 DIAGNOSIS — E871 Hypo-osmolality and hyponatremia: Secondary | ICD-10-CM | POA: Diagnosis not present

## 2017-08-19 DIAGNOSIS — K219 Gastro-esophageal reflux disease without esophagitis: Secondary | ICD-10-CM

## 2017-08-19 DIAGNOSIS — L853 Xerosis cutis: Secondary | ICD-10-CM

## 2017-08-19 LAB — BRAIN NATRIURETIC PEPTIDE: Pro B Natriuretic peptide (BNP): 101 pg/mL — ABNORMAL HIGH (ref 0.0–100.0)

## 2017-08-19 MED ORDER — AMMONIUM LACTATE 12 % EX LOTN: TOPICAL_LOTION | CUTANEOUS | 0 refills | Status: DC | PRN

## 2017-08-19 MED ORDER — OXYCODONE HCL 10 MG PO TABS
10.0000 mg | ORAL_TABLET | Freq: Three times a day (TID) | ORAL | 0 refills | Status: DC | PRN
Start: 2017-08-19 — End: 2018-03-24

## 2017-08-19 MED ORDER — METHOCARBAMOL 500 MG PO TABS: 500.0000 mg | ORAL_TABLET | Freq: Three times a day (TID) | ORAL | 1 refills | Status: DC | PRN

## 2017-08-19 MED ORDER — FUROSEMIDE 40 MG PO TABS: 40.0000 mg | ORAL_TABLET | Freq: Every day | ORAL | 3 refills | Status: DC

## 2017-08-19 MED ORDER — CLINDAMYCIN PHOSPHATE 1 % EX GEL: Freq: Two times a day (BID) | CUTANEOUS | 1 refills | Status: DC

## 2017-08-19 MED ORDER — PANTOPRAZOLE SODIUM 40 MG PO TBEC: 40.0000 mg | DELAYED_RELEASE_TABLET | Freq: Every day | ORAL | 3 refills | Status: DC

## 2017-08-19 MED ORDER — POTASSIUM CHLORIDE CRYS ER 20 MEQ PO TBCR: 20.0000 meq | EXTENDED_RELEASE_TABLET | Freq: Two times a day (BID) | ORAL | 3 refills | Status: DC

## 2017-08-19 MED FILL — CLINDAMYCIN PH 1% GEL: 1 | 10 days supply | Qty: 30 | Fill #0

## 2017-08-19 MED FILL — METHOCARBAMOL 500 MG TABS: 500 | 10 days supply | Qty: 30 | Fill #0

## 2017-08-19 MED FILL — oxyCODONE HCL 10 MG TABS: 10 | 15 days supply | Qty: 45 | Fill #0

## 2017-08-19 NOTE — Patient Instructions (Signed)
Great to see you today- I am happy to see you doing so well! We will check a BNP and chest x-ray today to look for any sign of heart failure causing fluid retention  I refilled your pain medication- please do a urine drug screen in the lab today as well (required by law for narcotic rx)  I will be in touch with your results asap

## 2017-08-26 ENCOUNTER — Encounter: Payer: Self-pay | Admitting: Internal Medicine

## 2017-08-26 ENCOUNTER — Inpatient Hospital Stay: Payer: 59 | Admitting: Internal Medicine

## 2017-08-26 ENCOUNTER — Inpatient Hospital Stay: Payer: 59

## 2017-08-26 ENCOUNTER — Telehealth: Payer: Self-pay | Admitting: Internal Medicine

## 2017-08-26 VITALS — BP 118/68 | HR 73 | Temp 98.1°F | Resp 18 | Ht 66.0 in | Wt 223.7 lb

## 2017-08-26 DIAGNOSIS — Z79899 Other long term (current) drug therapy: Secondary | ICD-10-CM

## 2017-08-26 DIAGNOSIS — C3492 Malignant neoplasm of unspecified part of left bronchus or lung: Secondary | ICD-10-CM

## 2017-08-26 DIAGNOSIS — Z5111 Encounter for antineoplastic chemotherapy: Secondary | ICD-10-CM

## 2017-08-26 DIAGNOSIS — C3490 Malignant neoplasm of unspecified part of unspecified bronchus or lung: Secondary | ICD-10-CM

## 2017-08-26 LAB — CMP (CANCER CENTER ONLY)
ALT: 16 U/L (ref 0–44)
AST: 20 U/L (ref 15–41)
Albumin: 3.4 g/dL — ABNORMAL LOW (ref 3.5–5.0)
Alkaline Phosphatase: 121 U/L (ref 38–126)
Anion gap: 7 (ref 5–15)
BUN: 19 mg/dL (ref 6–20)
CO2: 29 mmol/L (ref 22–32)
Calcium: 9.2 mg/dL (ref 8.9–10.3)
Chloride: 107 mmol/L (ref 98–111)
Creatinine: 1.01 mg/dL — ABNORMAL HIGH (ref 0.44–1.00)
GFR, Est AFR Am: >60 mL/min (ref >60)
GFR, Estimated: >60 mL/min (ref >60)
Glucose, Bld: 89 mg/dL (ref 70–99)
Potassium: 4.1 mmol/L (ref 3.5–5.1)
Sodium: 143 mmol/L (ref 135–145)
Total Bilirubin: 0.3 mg/dL (ref 0.3–1.2)
Total Protein: 6.7 g/dL (ref 6.5–8.1)

## 2017-08-26 LAB — CBC WITH DIFFERENTIAL (CANCER CENTER ONLY)
Basophils Absolute: 0 10*3/uL (ref 0.0–0.1)
Basophils Relative: 1 %
Eosinophils Absolute: 0.3 10*3/uL (ref 0.0–0.5)
Eosinophils Relative: 5 %
HCT: 38.2 % (ref 34.8–46.6)
Hemoglobin: 12.4 g/dL (ref 11.6–15.9)
Lymphocytes Relative: 23 %
Lymphs Abs: 1.6 10*3/uL (ref 0.9–3.3)
MCH: 29.2 pg (ref 25.1–34.0)
MCHC: 32.5 g/dL (ref 31.5–36.0)
MCV: 89.7 fL (ref 79.5–101.0)
Monocytes Absolute: 0.5 10*3/uL (ref 0.1–0.9)
Monocytes Relative: 8 %
Neutro Abs: 4.3 10*3/uL (ref 1.5–6.5)
Neutrophils Relative %: 63 %
Platelet Count: 287 10*3/uL (ref 145–400)
RBC: 4.26 MIL/uL (ref 3.70–5.45)
RDW: 15.1 % — ABNORMAL HIGH (ref 11.2–14.5)
WBC Count: 6.8 10*3/uL (ref 3.9–10.3)

## 2017-08-26 NOTE — Telephone Encounter (Signed)
Scheduled apt per 7/31 los -gave patient aVS and calender per los. Central radiology to contact patient with scan .

## 2017-08-26 NOTE — Progress Notes (Signed)
Nancy Moore [phone]   [phone]  OFFICE PROGRESS NOTE  Copland, Gay Filler, MD [address] [place] [address]  DIAGNOSIS: DIAGNOSIS: stage IV (T3, N2, M1 a) non-small cell lung cancer, poorly differentiated adenocarcinoma with extensive miliary distribution in the lungs bilaterally diagnosed in September 2018. 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 NEGATIVE for the Exon 20 T790M mutation  PRIOR THERAPY: None.  CURRENT THERAPY: Gilotrif 40 mg daily started 11/08/2016.Status post 10 months of treatment.  INTERVAL HISTORY: Nancy Moore 52 y.o. female returns to the clinic today for follow-up visit.  The patient is feeling fine today with no concerning complaints except for intermittent right shoulder pain.  She denied having any chest pain, shortness of breath, cough or hemoptysis.  She denied having any fever or chills.  The patient denied having any nausea, vomiting, diarrhea or constipation.  She denied having any weight loss or night sweats.  She is here today for evaluation and repeat blood work.  MEDICAL HISTORY: Past Medical History:  Diagnosis Date  . Adenocarcinoma of left lung, stage 4 ([facility]) 10/28/2016  . Goals of care, counseling/discussion 10/28/2016  . Hypertension     ALLERGIES:  is allergic to percocet [oxycodone-acetaminophen]; lisinopril; and losartan.  MEDICATIONS:  Current Outpatient Medications  Medication Sig Dispense Refill  . afatinib dimaleate (GILOTRIF) 40 MG tablet Take 1 tablet (40 mg total) by mouth daily. Take on an empty stomach 1hr before or 2 hrs after meals. 30 tablet 2  . ammonium lactate (LAC-HYDRIN) 12 % lotion Apply topically as needed for dry skin. 400 g 0  . clindamycin (CLINDAGEL) 1 % gel Apply topically 2 (two) times daily. 30 g 1  . dextromethorphan-guaiFENesin (MUCINEX DM) 30-600 MG 12hr tablet Take 1 tablet by mouth 2 (two) times daily as needed for cough. (Patient not  taking: Reported on 08/19/2017)    . furosemide (LASIX) 40 MG tablet Take 1 tablet (40 mg total) by mouth daily. 90 tablet 3  . loperamide (IMODIUM) 1 MG/5ML solution Take 2 mg as needed by mouth for diarrhea or loose stools.    . magnesium oxide (MAG-OX) 400 (241.3 Mg) MG tablet Take 1 tablet (400 mg total) by mouth daily.    . methocarbamol (ROBAXIN) 500 MG tablet Take 1 tablet (500 mg total) by mouth every 8 (eight) hours as needed for muscle spasms. 30 tablet 1  . Oxycodone HCl 10 MG TABS Take 1 tablet (10 mg total) by mouth 3 (three) times daily as needed. 45 tablet 0  . pantoprazole (PROTONIX) 40 MG tablet Take 1 tablet (40 mg total) by mouth daily at 12 noon. 90 tablet 3  . potassium chloride SA (K-DUR,KLOR-CON) 20 MEQ tablet Take 1 tablet (20 mEq total) by mouth 2 (two) times daily. 180 tablet 3  . prochlorperazine (COMPAZINE) 10 MG tablet Take 1 tablet (10 mg total) every 6 (six) hours as needed by mouth for nausea or vomiting. (Patient not taking: Reported on 08/19/2017) 30 tablet 1  . senna-docusate (SENOKOT-S) 8.6-50 MG tablet Take 1 tablet by mouth at bedtime. (Patient not taking: Reported on 08/19/2017)     No current facility-administered medications for this visit.     SURGICAL HISTORY:  Past Surgical History:  Procedure Laterality Date  . BREAST EXCISIONAL BIOPSY Left 20+ yrs ago   benign  . PERICARDIAL WINDOW N/A 01/10/2017   Procedure: PERICARDIAL WINDOW- SUB XYPHOID, RIGHT CHEST TUBE;  Surgeon: Prescott Gum,  Collier Salina, MD;  Location: [facility];  Service: Thoracic;  Laterality: N/A;  . VIDEO BRONCHOSCOPY Bilateral 10/21/2016   Procedure: VIDEO BRONCHOSCOPY WITH FLUORO;  Surgeon: Tanda Rockers, MD;  Location: [facility];  Service: Cardiopulmonary;  Laterality: Bilateral;    REVIEW OF SYSTEMS:  A comprehensive review of systems was negative except for: Musculoskeletal: positive for arthralgias   PHYSICAL EXAMINATION: General appearance: alert, cooperative and no distress Head:  Normocephalic, without obvious abnormality, atraumatic Neck: no adenopathy, no JVD, supple, symmetrical, trachea midline and thyroid not enlarged, symmetric, no tenderness/mass/nodules Lymph nodes: Cervical, supraclavicular, and axillary nodes normal. Resp: clear to auscultation bilaterally Back: symmetric, no curvature. ROM normal. No CVA tenderness. Cardio: regular rate and rhythm, S1, S2 normal, no murmur, click, rub or gallop GI: soft, non-tender; bowel sounds normal; no masses,  no organomegaly Extremities: extremities normal, atraumatic, no cyanosis or edema  ECOG PERFORMANCE STATUS: 1 - Symptomatic but completely ambulatory  Blood pressure 118/68, pulse 73, temperature 98.1 F (36.7 C), temperature source Oral, resp. rate 18, height _0  (1.676 m), weight 223 lb 11.2 oz (101.5 kg), last menstrual period 03/15/2010, SpO2 100 %.  LABORATORY DATA: Lab Results  Component Value Date   WBC 6.8 08/26/2017   HGB 12.4 08/26/2017   HCT 38.2 08/26/2017   MCV 89.7 08/26/2017   PLT 287 08/26/2017      Chemistry      Component Value Date/Time   NA 140 07/27/2017 1341   NA 135 (L) 01/08/2017 1055   K 4.3 07/27/2017 1341   K 3.2 (L) 01/08/2017 1055   CL 104 07/27/2017 1341   CO2 32 07/27/2017 1341   CO2 25 01/08/2017 1055   BUN 15 07/27/2017 1341   BUN 14.6 01/08/2017 1055   CREATININE 0.97 07/27/2017 1341   CREATININE 1.3 (H) 01/08/2017 1055      Component Value Date/Time   CALCIUM 9.3 07/27/2017 1341   CALCIUM 9.5 01/08/2017 1055   ALKPHOS 133 (H) 07/27/2017 1341   ALKPHOS 82 01/08/2017 1055   AST 21 07/27/2017 1341   AST 31 01/08/2017 1055   ALT 18 07/27/2017 1341   ALT 34 01/08/2017 1055   BILITOT 0.3 07/27/2017 1341   BILITOT 1.74 (H) 01/08/2017 1055       RADIOGRAPHIC STUDIES: Dg Chest 2 View  Result Date: 08/19/2017 CLINICAL DATA:  History of stage IV lung cancer. Evaluate for fluid. Ongoing shortness of breath. EXAM: CHEST - 2 VIEW COMPARISON:  Chest x-rays  dated 06/08/2017, 02/13/2017 and 01/22/2017. FINDINGS: Heart size and mediastinal contours are within normal limits. Coarse interstitial markings again noted bilaterally. No confluent opacity to suggest a developing pneumonia. No pleural effusions seen. No acute or suspicious osseous finding IMPRESSION: 1. Chronic interstitial prominence, similar to multiple prior studies indicating either mild chronic interstitial edema or chronic interstitial lung disease. 2. No acute findings. No evidence of pneumonia or alveolar pulmonary edema. Electronically Signed   By: Franki Cabot M.D.   On: 08/19/2017 13:50    ASSESSMENT AND PLAN: This is a very pleasant 52 years old African-American female with metastatic non-small cell lung cancer, adenocarcinoma and positive for EGFR mutation with deletion in exon 19.  The patient is currently on treatment with GILOTRIF 40 mg p.o. daily status post 10 months.  She has been tolerating this treatment well with no concerning complaints. I recommended for her to proceed with her treatment today as scheduled. I will see her back for follow-up visit in 1 month for evaluation after repeating  CT scan of the chest, abdomen and pelvis for restaging of her disease. The patient was advised to call immediately if she has any concerning symptoms in the interval. The patient voices understanding of current disease status and treatment options and is in agreement with the current care plan. All questions were answered. The patient knows to call the clinic with any problems, questions or concerns. We can certainly see the patient much sooner if necessary.  I spent 10 minutes counseling the patient face to face. The total time spent in the appointment was 15 minutes.  Disclaimer: This note was dictated with voice recognition software. Similar sounding words can inadvertently be transcribed and may not be corrected upon review.

## 2017-09-08 MED FILL — PANTOPRAZOLE SOD DR 40 MG T: 40 | 30 days supply | Qty: 30 | Fill #0

## 2017-09-08 MED FILL — POTASSIUM CL ER 20 MEQ TAB: 20 | 30 days supply | Qty: 60 | Fill #0

## 2017-09-08 MED FILL — FUROSEMIDE 40 MG TAB: 40 | 30 days supply | Qty: 30 | Fill #3

## 2017-09-10 NOTE — Progress Notes (Signed)
FMLA successfully faxed to [employer] at [phone]. Mailed copy to patient address on file.

## 2017-09-17 ENCOUNTER — Telehealth: Payer: Self-pay | Admitting: Internal Medicine

## 2017-09-17 NOTE — Telephone Encounter (Signed)
Faxed medical records to [employer], Release ID: [id number]

## 2017-09-29 ENCOUNTER — Other Ambulatory Visit: Payer: 59

## 2017-09-30 ENCOUNTER — Inpatient Hospital Stay: Payer: 59 | Attending: Oncology

## 2017-09-30 ENCOUNTER — Ambulatory Visit (HOSPITAL_COMMUNITY)
Admission: RE | Admit: 2017-09-30 | Discharge: 2017-09-30 | Disposition: A | Discharge status: Home / Self Care | Payer: 59 | Source: Ambulatory Visit | Attending: Internal Medicine | Admitting: Internal Medicine

## 2017-09-30 DIAGNOSIS — C3490 Malignant neoplasm of unspecified part of unspecified bronchus or lung: Secondary | ICD-10-CM | POA: Diagnosis not present

## 2017-09-30 DIAGNOSIS — C3492 Malignant neoplasm of unspecified part of left bronchus or lung: Secondary | ICD-10-CM | POA: Insufficient documentation

## 2017-09-30 LAB — CBC WITH DIFFERENTIAL (CANCER CENTER ONLY)
Basophils Absolute: 0 10*3/uL (ref 0.0–0.1)
Basophils Relative: 0 %
Eosinophils Absolute: 0.3 10*3/uL (ref 0.0–0.5)
Eosinophils Relative: 4 %
HCT: 40.6 % (ref 34.8–46.6)
Hemoglobin: 12.8 g/dL (ref 11.6–15.9)
Lymphocytes Relative: 26 %
Lymphs Abs: 2.3 10*3/uL (ref 0.9–3.3)
MCH: 29.2 pg (ref 25.1–34.0)
MCHC: 31.5 g/dL (ref 31.5–36.0)
MCV: 92.7 fL (ref 79.5–101.0)
Monocytes Absolute: 0.8 10*3/uL (ref 0.1–0.9)
Monocytes Relative: 9 %
Neutro Abs: 5.6 10*3/uL (ref 1.5–6.5)
Neutrophils Relative %: 61 %
Platelet Count: 278 10*3/uL (ref 145–400)
RBC: 4.38 MIL/uL (ref 3.70–5.45)
RDW: 14 % (ref 11.2–14.5)
WBC Count: 9.1 10*3/uL (ref 3.9–10.3)

## 2017-09-30 LAB — CMP (CANCER CENTER ONLY)
ALT: 10 U/L (ref 0–44)
AST: 19 U/L (ref 15–41)
Albumin: 3.6 g/dL (ref 3.5–5.0)
Alkaline Phosphatase: 126 U/L (ref 38–126)
Anion gap: 7 (ref 5–15)
BUN: 13 mg/dL (ref 6–20)
CO2: 32 mmol/L (ref 22–32)
Calcium: 9.4 mg/dL (ref 8.9–10.3)
Chloride: 103 mmol/L (ref 98–111)
Creatinine: 1.02 mg/dL — ABNORMAL HIGH (ref 0.44–1.00)
GFR, Est AFR Am: >60 mL/min (ref >60)
GFR, Estimated: >60 mL/min (ref >60)
Glucose, Bld: 84 mg/dL (ref 70–99)
Potassium: 4.4 mmol/L (ref 3.5–5.1)
Sodium: 142 mmol/L (ref 135–145)
Total Bilirubin: 0.4 mg/dL (ref 0.3–1.2)
Total Protein: 7 g/dL (ref 6.5–8.1)

## 2017-09-30 MED ORDER — IOHEXOL 300 MG/ML  SOLN
100.0000 mL | Freq: Once | INTRAMUSCULAR | Status: AC | PRN
  Administered 2017-09-30: 100 mL via INTRAVENOUS

## 2017-10-02 NOTE — Assessment & Plan Note (Addendum)
This is a very pleasant 52 year old African-American female with metastatic non-small cell lung cancer, adenocarcinoma and positive for EGFR mutation with deletion in exon 19.  The patient is currently on treatment with GILOTRIF 40 mg p.o. daily status post 11 months.  She has been tolerating this treatment well with no concerning complaints. She had a restaging CT scan and is here to discuss the results.  The patient was seen with Dr. Earlie Server.  CT scan results were discussed with the patient and her sister which showed no evidence of disease progression.  Recommend that she continue Gilotrif 40 mg daily. She will follow-up in 1 month for evaluation and repeat lab work.  The patient was advised to call immediately if she has any concerning symptoms in the interval. The patient voices understanding of current disease status and treatment options and is in agreement with the current care plan. All questions were answered. The patient knows to call the clinic with any problems, questions or concerns. We can certainly see the patient much sooner if necessary.

## 2017-10-02 NOTE — Progress Notes (Signed)
[facility] OFFICE PROGRESS NOTE  Copland, Gay Filler, MD [address] [place] [address]  DIAGNOSIS:stage IV (T3, N2, M1 a) non-small cell lung cancer, poorly differentiated adenocarcinoma with extensive miliary distribution in the lungs bilaterally diagnosed in September 2018. 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 NEGATIVE for the Exon 20 T790M mutation  PRIOR THERAPY:None.  CURRENT THERAPY:Gilotrif 40 mg daily started 11/08/2016.Status post65month of treatment.  INTERVAL HISTORY: Nancy EvesBest 52y.o. female returns for routine follow-up visit accompanied by her sister.  The patient is feeling fine today with no concerning complaints except for intermittent right shoulder pain.  She thinks that this might be related to how she slept.  She has not taken any medication for this issue.  She denies fevers and chills.  Denies chest pain, shortness of breath, cough, hemoptysis.  Denies nausea, vomiting, constipation, diarrhea.  Denies recent weight loss or night sweats.  Denies rashes.  The patient is here for evaluation and discussion of her restaging CT scan.  MEDICAL HISTORY: Past Medical History:  Diagnosis Date  . Adenocarcinoma of left lung, stage 4 ([facility] 10/28/2016  . Goals of care, counseling/discussion 10/28/2016  . Hypertension     ALLERGIES:  is allergic to percocet [oxycodone-acetaminophen]; lisinopril; and losartan.  MEDICATIONS:  Current Outpatient Medications  Medication Sig Dispense Refill  . afatinib dimaleate (GILOTRIF) 40 MG tablet Take 1 tablet (40 mg total) by mouth daily. Take on an empty stomach 1hr before or 2 hrs after meals. 30 tablet 2  . ammonium lactate (LAC-HYDRIN) 12 % lotion Apply topically as needed for dry skin. 400 g 0  . clindamycin (CLINDAGEL) 1 % gel Apply topically 2 (two) times daily. 30 g 1  . dextromethorphan-guaiFENesin (MUCINEX DM) 30-600 MG 12hr tablet Take 1 tablet by mouth 2 (two) times daily as  needed for cough.    . furosemide (LASIX) 40 MG tablet Take 1 tablet (40 mg total) by mouth daily. 90 tablet 3  . loperamide (IMODIUM) 1 MG/5ML solution Take 2 mg as needed by mouth for diarrhea or loose stools.    . magnesium oxide (MAG-OX) 400 (241.3 Mg) MG tablet Take 1 tablet (400 mg total) by mouth daily.    . methocarbamol (ROBAXIN) 500 MG tablet Take 1 tablet (500 mg total) by mouth every 8 (eight) hours as needed for muscle spasms. 30 tablet 1  . Oxycodone HCl 10 MG TABS Take 1 tablet (10 mg total) by mouth 3 (three) times daily as needed. 45 tablet 0  . pantoprazole (PROTONIX) 40 MG tablet Take 1 tablet (40 mg total) by mouth daily at 12 noon. 90 tablet 3  . potassium chloride SA (K-DUR,KLOR-CON) 20 MEQ tablet Take 1 tablet (20 mEq total) by mouth 2 (two) times daily. 180 tablet 3  . prochlorperazine (COMPAZINE) 10 MG tablet Take 1 tablet (10 mg total) every 6 (six) hours as needed by mouth for nausea or vomiting. 30 tablet 1  . senna-docusate (SENOKOT-S) 8.6-50 MG tablet Take 1 tablet by mouth at bedtime.     No current facility-administered medications for this visit.     SURGICAL HISTORY:  Past Surgical History:  Procedure Laterality Date  . BREAST EXCISIONAL BIOPSY Left 20+ yrs ago   benign  . PERICARDIAL WINDOW N/A 01/10/2017   Procedure: PERICARDIAL WINDOW- SUB XYPHOID, RIGHT CHEST TUBE;  Surgeon: VIvin Poot MD;  Location[facility]  Service: Thoracic;  Laterality: N/A;  . VIDEO BRONCHOSCOPY Bilateral 10/21/2016  Procedure: VIDEO BRONCHOSCOPY WITH FLUORO;  Surgeon: Tanda Rockers, MD;  Location: [facility];  Service: Cardiopulmonary;  Laterality: Bilateral;    REVIEW OF SYSTEMS:   Review of Systems  Constitutional: Negative for appetite change, chills, fatigue, fever and unexpected weight change.  HENT:   Negative for mouth sores, nosebleeds, sore throat and trouble swallowing.  Positive for right-sided neck pain. Eyes: Negative for eye problems and icterus.   Respiratory: Negative for cough, hemoptysis, shortness of breath and wheezing.   Cardiovascular: Negative for chest pain and leg swelling.  Gastrointestinal: Negative for abdominal pain, constipation, diarrhea, nausea and vomiting.  Genitourinary: Negative for bladder incontinence, difficulty urinating, dysuria, frequency and hematuria.   Musculoskeletal: Negative for back pain, gait problem, neck pain and neck stiffness.  Skin: Negative for itching and rash.  Neurological: Negative for dizziness, extremity weakness, gait problem, headaches, light-headedness and seizures.  Hematological: Negative for adenopathy. Does not bruise/bleed easily.  Psychiatric/Behavioral: Negative for confusion, depression and sleep disturbance. The patient is not nervous/anxious.     PHYSICAL EXAMINATION:  Blood pressure 123/74, pulse 64, temperature 98.2 F (36.8 C), temperature source Oral, resp. rate 13, height 5' 6"  (1.676 m), weight 227 lb 9.6 oz (103.2 kg), last menstrual period 03/15/2010, SpO2 100 %.  ECOG PERFORMANCE STATUS: 1 - Symptomatic but completely ambulatory  Physical Exam  Constitutional: Oriented to person, place, and time and well-developed, well-nourished, and in no distress. No distress.  HENT:  Head: Normocephalic and atraumatic.  Mouth/Throat: Oropharynx is clear and moist. No oropharyngeal exudate.  Eyes: Conjunctivae are normal. Right eye exhibits no discharge. Left eye exhibits no discharge. No scleral icterus.  Neck: Normal range of motion. Neck supple.  Cardiovascular: Normal rate, regular rhythm, normal heart sounds and intact distal pulses.   Pulmonary/Chest: Effort normal and breath sounds normal. No respiratory distress. No wheezes. No rales.  Abdominal: Soft. Bowel sounds are normal. Exhibits no distension and no mass. There is no tenderness.  Musculoskeletal: Normal range of motion. Exhibits no edema.  Lymphadenopathy:    No cervical adenopathy.  Neurological: Alert and  oriented to person, place, and time. Exhibits normal muscle tone. Gait normal. Coordination normal.  Skin: Skin is warm and dry. No rash noted. Not diaphoretic. No erythema. No pallor.  Psychiatric: Mood, memory and judgment normal.  Vitals reviewed.  LABORATORY DATA: Lab Results  Component Value Date   WBC 9.1 09/30/2017   HGB 12.8 09/30/2017   HCT 40.6 09/30/2017   MCV 92.7 09/30/2017   PLT 278 09/30/2017      Chemistry      Component Value Date/Time   NA 142 09/30/2017 1044   NA 135 (L) 01/08/2017 1055   K 4.4 09/30/2017 1044   K 3.2 (L) 01/08/2017 1055   CL 103 09/30/2017 1044   CO2 32 09/30/2017 1044   CO2 25 01/08/2017 1055   BUN 13 09/30/2017 1044   BUN 14.6 01/08/2017 1055   CREATININE 1.02 (H) 09/30/2017 1044   CREATININE 1.3 (H) 01/08/2017 1055      Component Value Date/Time   CALCIUM 9.4 09/30/2017 1044   CALCIUM 9.5 01/08/2017 1055   ALKPHOS 126 09/30/2017 1044   ALKPHOS 82 01/08/2017 1055   AST 19 09/30/2017 1044   AST 31 01/08/2017 1055   ALT 10 09/30/2017 1044   ALT 34 01/08/2017 1055   BILITOT 0.4 09/30/2017 1044   BILITOT 1.74 (H) 01/08/2017 1055       RADIOGRAPHIC STUDIES:  Ct Chest W Contrast  Result Date:  09/30/2017 CLINICAL DATA:  Stage IV left lung adenocarcinoma, ongoing oral chemotherapy EXAM: CT CHEST, ABDOMEN, AND PELVIS WITH CONTRAST TECHNIQUE: Multidetector CT imaging of the chest, abdomen and pelvis was performed following the standard protocol during bolus administration of intravenous contrast. CONTRAST:  144m OMNIPAQUE IOHEXOL 300 MG/ML  SOLN COMPARISON:  06/26/2017 FINDINGS: CT CHEST FINDINGS Cardiovascular: The heart is normal in size. Trace pericardial effusion. No evidence of thoracic aortic aneurysm. Mediastinum/Nodes: 6 mm short axis prevascular node, previously 9 mm. No suspicious hilar or axillary lymphadenopathy. Visualized thyroid is unremarkable. Lungs/Pleura: Bandlike opacity in the superior segment left lower lobe,  measuring 15 mm in maximal axial dimension (series 8/image 41), grossly unchanged. Numerous peribronchovascular and centrilobular ground-glass opacities in the lungs bilaterally, upper lobe predominant, grossly unchanged. No pleural effusion or pneumothorax. Musculoskeletal: Stable sclerotic lesion along the posterior aspect of the T3 vertebral body (sagittal image 84). Thoracic spine is otherwise within normal limits. CT ABDOMEN PELVIS FINDINGS Hepatobiliary: Liver is within normal limits. Gallbladder is unremarkable. No intrahepatic or extrahepatic ductal dilatation. Pancreas: Within normal limits. Spleen: Within normal limits. Adrenals/Urinary Tract: Adrenal glands within normal limits. Kidneys are within normal limits.  No hydronephrosis. Bladder is within normal limits. Stomach/Bowel: Stomach is within normal limits. No evidence of bowel obstruction. Appendix is not discretely visualized. Mild colonic stool burden Vascular/Lymphatic: No evidence of abdominal aortic aneurysm. Atherosclerotic calcifications of the abdominal aorta and branch vessels. No suspicious abdominopelvic lymphadenopathy. Reproductive: Status post hysterectomy. No adnexal masses. Other: No abdominopelvic ascites. Tiny fat containing periumbilical hernia. Musculoskeletal: Visualized osseous structures are within normal limits. IMPRESSION: Stable diffuse peribronchovascular nodularity with ground-glass opacities. Stable bandlike opacity in the superior segment left lower lobe. No evidence of new/progressive metastatic disease. Electronically Signed   By: SJulian HyM.D.   On: 09/30/2017 17:14   Ct Abdomen Pelvis W Contrast  Result Date: 09/30/2017 CLINICAL DATA:  Stage IV left lung adenocarcinoma, ongoing oral chemotherapy EXAM: CT CHEST, ABDOMEN, AND PELVIS WITH CONTRAST TECHNIQUE: Multidetector CT imaging of the chest, abdomen and pelvis was performed following the standard protocol during bolus administration of intravenous  contrast. CONTRAST:  106mOMNIPAQUE IOHEXOL 300 MG/ML  SOLN COMPARISON:  06/26/2017 FINDINGS: CT CHEST FINDINGS Cardiovascular: The heart is normal in size. Trace pericardial effusion. No evidence of thoracic aortic aneurysm. Mediastinum/Nodes: 6 mm short axis prevascular node, previously 9 mm. No suspicious hilar or axillary lymphadenopathy. Visualized thyroid is unremarkable. Lungs/Pleura: Bandlike opacity in the superior segment left lower lobe, measuring 15 mm in maximal axial dimension (series 8/image 41), grossly unchanged. Numerous peribronchovascular and centrilobular ground-glass opacities in the lungs bilaterally, upper lobe predominant, grossly unchanged. No pleural effusion or pneumothorax. Musculoskeletal: Stable sclerotic lesion along the posterior aspect of the T3 vertebral body (sagittal image 84). Thoracic spine is otherwise within normal limits. CT ABDOMEN PELVIS FINDINGS Hepatobiliary: Liver is within normal limits. Gallbladder is unremarkable. No intrahepatic or extrahepatic ductal dilatation. Pancreas: Within normal limits. Spleen: Within normal limits. Adrenals/Urinary Tract: Adrenal glands within normal limits. Kidneys are within normal limits.  No hydronephrosis. Bladder is within normal limits. Stomach/Bowel: Stomach is within normal limits. No evidence of bowel obstruction. Appendix is not discretely visualized. Mild colonic stool burden Vascular/Lymphatic: No evidence of abdominal aortic aneurysm. Atherosclerotic calcifications of the abdominal aorta and branch vessels. No suspicious abdominopelvic lymphadenopathy. Reproductive: Status post hysterectomy. No adnexal masses. Other: No abdominopelvic ascites. Tiny fat containing periumbilical hernia. Musculoskeletal: Visualized osseous structures are within normal limits. IMPRESSION: Stable diffuse peribronchovascular nodularity with ground-glass opacities. Stable bandlike  opacity in the superior segment left lower lobe. No evidence of  new/progressive metastatic disease. Electronically Signed   By: Julian Hy M.D.   On: 09/30/2017 17:14     ASSESSMENT/PLAN:  Adenocarcinoma of left lung, stage 4 ([facility]) This is a very pleasant 52 year old African-American female with metastatic non-small cell lung cancer, adenocarcinoma and positive for EGFR mutation with deletion in exon 19.  The patient is currently on treatment with GILOTRIF 40 mg p.o. daily status post 11 months.  She has been tolerating this treatment well with no concerning complaints. She had a restaging CT scan and is here to discuss the results.  The patient was seen with Dr. Earlie Server.  CT scan results were discussed with the patient and her sister which showed no evidence of disease progression.  Recommend that she continue Gilotrif 40 mg daily. She will follow-up in 1 month for evaluation and repeat lab work.  The patient was advised to call immediately if she has any concerning symptoms in the interval. The patient voices understanding of current disease status and treatment options and is in agreement with the current care plan. All questions were answered. The patient knows to call the clinic with any problems, questions or concerns. We can certainly see the patient much sooner if necessary.   Orders Placed This Encounter  Procedures  . CBC with Differential (Cancer Center Only)    Standing Status:   Future    Standing Expiration Date:   10/06/2018  . CMP ([facility] only)    Standing Status:   Future    Standing Expiration Date:   10/06/2018     Nancy Bussing, DNP, AGPCNP-BC, AOCNP 10/05/17   ADDENDUM: Hematology/Oncology Attending: I had a face-to-face encounter with the patient.  I recommended her care plan.  This is a very pleasant 52 years old African-American female with metastatic non-small cell lung cancer, adenocarcinoma with positive EGFR mutation with deletion in exon 45. The patient is currently on treatment with GILOTRIF 40 mg p.o.  daily status post 11 months and has been tolerating this treatment well with no concerning adverse effects.  She had infrequent episodes of diarrhea but it usually related to her meals.  She denied having any significant skin rash. The patient had repeat CT scan of the chest, abdomen and pelvis performed recently.  I personally and independently reviewed the scans and discussed the results with the patient and her sisters today. The patient has no concerning findings for disease progression on the recent scan. I recommended for her to continue her current treatment with GILOTRIF with the same dose. I will see her back for follow-up visit in 1 months for evaluation with repeat blood work. She was advised to call immediately if she has any concerning symptoms in the interval.  Disclaimer: This note was dictated with voice recognition software. Similar sounding words can inadvertently be transcribed and may be missed upon review. Eilleen Kempf, MD 10/06/17

## 2017-10-05 ENCOUNTER — Inpatient Hospital Stay (HOSPITAL_BASED_OUTPATIENT_CLINIC_OR_DEPARTMENT_OTHER): Payer: 59 | Admitting: Oncology

## 2017-10-05 ENCOUNTER — Encounter: Payer: Self-pay | Admitting: Oncology

## 2017-10-05 ENCOUNTER — Other Ambulatory Visit: Payer: Self-pay

## 2017-10-05 ENCOUNTER — Telehealth: Payer: Self-pay | Admitting: Oncology

## 2017-10-05 VITALS — BP 123/74 | HR 64 | Temp 98.2°F | Resp 13 | Ht 66.0 in | Wt 227.6 lb

## 2017-10-05 DIAGNOSIS — C3492 Malignant neoplasm of unspecified part of left bronchus or lung: Secondary | ICD-10-CM | POA: Diagnosis not present

## 2017-10-05 DIAGNOSIS — Z5111 Encounter for antineoplastic chemotherapy: Secondary | ICD-10-CM

## 2017-10-05 NOTE — Telephone Encounter (Signed)
Scheduled appt per 9/9 los - gave patient aVS and calender per los.   

## 2017-10-08 ENCOUNTER — Telehealth: Payer: Self-pay

## 2017-10-08 NOTE — Telephone Encounter (Signed)
Oral Oncology Patient Advocate Encounter  Received notification from [facility] that the existing prior authorization for Gilotrif is due for renewal.  Renewal PA submitted on CoverMyMeds Key [id number] Status is pending  Oral Oncology Clinic will continue to follow.  [facility] Patient [place] Phone [phone] Fax [phone]

## 2017-10-09 NOTE — Telephone Encounter (Signed)
Oral Oncology Patient Advocate Encounter  Prior Authorization for Nancy Moore has been approved.    PA# [id number] Effective dates: 10/08/17 through 10/09/18  Oral Oncology Clinic will continue to follow.   [facility] Phone [phone] Fax [phone]

## 2017-10-14 ENCOUNTER — Other Ambulatory Visit: Payer: Self-pay | Admitting: Internal Medicine

## 2017-10-14 DIAGNOSIS — C3492 Malignant neoplasm of unspecified part of left bronchus or lung: Secondary | ICD-10-CM

## 2017-10-15 MED FILL — POTASSIUM CL ER 20 MEQ TAB: 20 | 30 days supply | Qty: 60 | Fill #1

## 2017-10-15 MED FILL — PANTOPRAZOLE SOD DR 40 MG T: 40 | 30 days supply | Qty: 30 | Fill #1

## 2017-10-15 MED FILL — CLINDAMYCIN PH 1% GEL: 1 | 10 days supply | Qty: 30 | Fill #1

## 2017-10-15 MED FILL — METHOCARBAMOL 500 MG TABS: 500 | 10 days supply | Qty: 30 | Fill #1

## 2017-10-15 MED FILL — FUROSEMIDE 40 MG TAB: 40 | 30 days supply | Qty: 30 | Fill #4

## 2017-10-22 ENCOUNTER — Telehealth: Payer: Self-pay | Admitting: Medical Oncology

## 2017-10-22 NOTE — Telephone Encounter (Signed)
Gilotrif approved.

## 2017-10-26 ENCOUNTER — Ambulatory Visit: Payer: Self-pay | Admitting: *Deleted

## 2017-10-26 NOTE — Progress Notes (Signed)
Nancy Moore [address], [place], [place] [address] [phone]  Date:  10/28/2017   Name:  Nancy Moore   DOB:  [birth year]   MRN:  [id number]  PCP:  Darreld Mclean, MD    Chief Complaint: Shoulder Pain (right shoulder pain, radiating from neck to arm, nodules on right side-possibly related? ) and Dizziness (2 weeks, diagnosed with vertigo in past but does not activly take medicine, medication reaction to oxycodone )   History of Present Illness:  Nancy Moore is a 52 y.o. very pleasant female patient who presents with the following:  History of stage 4 lung cancer, as of late she is doing very well!   Per most recent oncology note 9/9: DIAGNOSIS:stage IV (T3, N2, M1 a) non-small cell lung cancer, poorly differentiated adenocarcinoma with extensive miliary distribution in the lungs bilaterally diagnosed in September 2018. POSITIVE for an Exon 19 deletion mutation.NEGATIVE for the Exon 20 T790M mutation PRIOR THERAPY:None. CURRENT THERAPY:Gilotrif 40 mg daily started 11/08/2016.Status post59months of treatment. INTERVAL HISTORY: Nancy Moore 52 y.o. female returns for routine follow-up visit accompanied by her sister.  The patient is feeling fine today with no concerning complaints except for intermittent right shoulder pain.  She thinks that this might be related to how she slept.  She has not taken any medication for this issue.  She denies fevers and chills.  Denies chest pain, shortness of breath, cough, hemoptysis.  Denies nausea, vomiting, constipation, diarrhea.  Denies recent weight loss or night sweats.  Denies rashes.  The patient is here for evaluation and discussion of her restaging CT scan.///////////////////////////////////// The patient was seen with Dr. Earlie Server.  CT scan results were discussed with the patient and her sister which showed no evidence of disease progression.  Recommend that she  continue Gilotrif 40 mg daily. She will follow-up in 1 month for evaluation and repeat lab work.  Recent labs looked great Colon cancer screening:  She will ask her oncologist if cologuard is appropriate when she is seen later this month   Here today due to shoulder pain and dizziness.  Per phone note 9/30: Pt calling to schedule appt for right sided neck and shoulder pain but also voices that she experiences dizziness at times. Pt states that she has experienced the neck pain for a while now and states it goes from the base of her neck on the right side and down to the shoulder. Pt states if feels like something is digging in her shoulder and feels tight, like a blood pressure cuff is around the arm. Pt states it feels like her shoulder is disconnected and rates her pain at 7. Pt states she has a history of vertigo and feels like the room is tilting. Pt states that this has been occurring for the past 2 weeks and notices it with standing and with taking Oxydone, but states she also has this without taking the medication. Pt scheduled for appt on 10/28/17 and advised to return call to the office if symptoms become worse. Pt verbalized understanding.   She has noted pain in her right neck and down the right arm  Present for about 2 months now, intermittent NKI She does carry her bag on her right shoulder,? Could this be related No tingling or numbness It feels like an ache  She cannot reach behind herself any longer due to pain- like can't reach into the back seat of  her car as it hurts It does hurt when she lays down at night. She will often take a pain med during the night due to shoulder pain - might take either a robaxin or oxycodone, depending She did have a chest tube on the right side last December but her current pain is higher than her chest tube placement so she does not think related   She tried robaxin last night and slept well  Tylenol does help  She is taking oxycodone on occasion- I  gave her 45 pills back in July, so not using a lot  Also, she has noted some lightheadedness which she has noted in discrete episodes She has been dx with vertigo in the past  She does not want to use meclizine again which she tried in the past - it just make her tired The dizziness might be related to medication use, she will try to monitor this.  Overall her dizziness does not seem like a major thing to her, she just wanted to mention it to me   BP Readings from Last 3 Encounters:  10/28/17 114/70  10/05/17 123/74  08/26/17 118/68   Wt Readings from Last 3 Encounters:  10/28/17 227 lb (103 kg)  10/05/17 227 lb 9.6 oz (103.2 kg)  08/26/17 223 lb 11.2 oz (101.5 kg)      Patient Active Problem List   Diagnosis Date Noted  . Cardiac/pericardial tamponade   . Chest tube in place   . Acute lower UTI   . Pain   . Acute blood loss anemia   . Palliative care encounter   . AKI (acute kidney injury) ([facility]) 01/10/2017  . Hypotension 01/09/2017  . Essential hypertension 01/09/2017  . Hypokalemia 01/09/2017  . Dyspnea 01/09/2017  . Leukocytosis 01/09/2017  . Pleural effusion on right 01/08/2017  . Encounter for antineoplastic chemotherapy 11/20/2016  . Adenocarcinoma of left lung, stage 4 ([facility]) 10/28/2016  . Goals of care, counseling/discussion 10/28/2016  . Upper airway cough syndrome 10/16/2016  . Multiple pulmonary nodules 10/15/2016    Past Medical History:  Diagnosis Date  . Adenocarcinoma of left lung, stage 4 ([facility]) 10/28/2016  . Goals of care, counseling/discussion 10/28/2016  . Hypertension     Past Surgical History:  Procedure Laterality Date  . BREAST EXCISIONAL BIOPSY Left 20+ yrs ago   benign  . PERICARDIAL WINDOW N/A 01/10/2017   Procedure: PERICARDIAL WINDOW- SUB XYPHOID, RIGHT CHEST TUBE;  Surgeon: Ivin Poot, MD;  Location: [facility];  Service: Thoracic;  Laterality: N/A;  . VIDEO BRONCHOSCOPY Bilateral 10/21/2016   Procedure: VIDEO BRONCHOSCOPY WITH FLUORO;   Surgeon: Tanda Rockers, MD;  Location: [facility];  Service: Cardiopulmonary;  Laterality: Bilateral;    Social History   Tobacco Use  . Smoking status: Never Smoker  . Smokeless tobacco: Never Used  Substance Use Topics  . Alcohol use: Yes    Comment: socially  . Drug use: No    Family History  Problem Relation Age of Onset  . Asthma Mother   . Stroke Mother   . Hypertension Mother   . Heart attack Father   . Hypertension Father   . Hyperlipidemia Father   . Dementia Father   . Emphysema Maternal Grandmother   . Hypertension Maternal Grandmother   . Colon cancer Paternal Grandmother     Allergies  Allergen Reactions  . Percocet [Oxycodone-Acetaminophen] Other (See Comments)    "makes me dizzy"  . Lisinopril Cough  . Losartan  Medication list has been reviewed and updated.  Current Outpatient Medications on File Prior to Visit  Medication Sig Dispense Refill  . ammonium lactate (LAC-HYDRIN) 12 % lotion Apply topically as needed for dry skin. 400 g 0  . clindamycin (CLINDAGEL) 1 % gel Apply topically 2 (two) times daily. 30 g 1  . furosemide (LASIX) 40 MG tablet Take 1 tablet (40 mg total) by mouth daily. 90 tablet 3  . GILOTRIF 40 MG tablet TAKE 1 TABLET DAILY. TAKE ON AN EMPTY STOMACH 1 HOUR BEFORE OR 2 HOURS AFTER A MEALS 30 tablet 1  . loperamide (IMODIUM) 1 MG/5ML solution Take 2 mg as needed by mouth for diarrhea or loose stools.    . magnesium oxide (MAG-OX) 400 (241.3 Mg) MG tablet Take 1 tablet (400 mg total) by mouth daily.    . methocarbamol (ROBAXIN) 500 MG tablet Take 1 tablet (500 mg total) by mouth every 8 (eight) hours as needed for muscle spasms. 30 tablet 1  . Oxycodone HCl 10 MG TABS Take 1 tablet (10 mg total) by mouth 3 (three) times daily as needed. 45 tablet 0  . pantoprazole (PROTONIX) 40 MG tablet Take 1 tablet (40 mg total) by mouth daily at 12 noon. 90 tablet 3  . potassium chloride SA (K-DUR,KLOR-CON) 20 MEQ tablet Take 1 tablet (20  mEq total) by mouth 2 (two) times daily. 180 tablet 3  . prochlorperazine (COMPAZINE) 10 MG tablet Take 1 tablet (10 mg total) every 6 (six) hours as needed by mouth for nausea or vomiting. 30 tablet 1  . senna-docusate (SENOKOT-S) 8.6-50 MG tablet Take 1 tablet by mouth at bedtime.     No current facility-administered medications on file prior to visit.     Review of Systems:  As per HPI- otherwise negative.   Physical Examination: Vitals:   10/28/17 1457  BP: 114/70  Pulse: 75  Resp: 16  Temp: 98.1 F (36.7 C)  SpO2: 99%   Vitals:   10/28/17 1457  Weight: 227 lb (103 kg)  Height: 5\' 6"  (1.676 m)   Body mass index is 36.64 kg/m. Ideal Body Weight: Weight in (lb) to have BMI = 25: 154.6  GEN: WDWN, NAD, Non-toxic, A & O x 3, looks well, obese HEENT: Atraumatic, Normocephalic. Neck supple. No masses, No LAD.  Bilateral TM wnl, oropharynx normal.  PEERL,EOMI.   Ears and Nose: No external deformity. CV: RRR, No M/G/R. No JVD. No thrill. No extra heart sounds. PULM: CTA B, no wheezes, crackles, rhonchi. No retractions. No resp. distress. No accessory muscle use. EXTR: No c/c/e NEURO Normal gait.  Normal strength, sensation and dTR of all limbs. Negative romberg She endorses a pain running from the right trapezius into the right upper arm.  I am not able to define reproduce rotator cuff pain signs, but the shoulder joint is slightly stiff to flexion and abduction PSYCH: Normally interactive. Conversant. Not depressed or anxious appearing.  Calm demeanor.    Assessment and Plan: Chronic right shoulder pain - Plan: Ambulatory referral to Sports Medicine  Muscle spasm  Dizzy spells  Flu shot given Referral to sports med- this does seem to be an MSK issue but I cannot determine the exact etiology Intermittent dizziness may be related to medication use Neuro exam is reassuring  She will monitor this and let me know if getting worse or persistent   BP Readings from Last 3  Encounters:  10/28/17 114/70  10/05/17 123/74  08/26/17 118/68  Signed Lamar Blinks, MD

## 2017-10-26 NOTE — Telephone Encounter (Signed)
Pt calling to schedule appt for right sided neck and shoulder pain but also voices that she experiences dizziness at times. Pt states that she has experienced the neck pain for a while now and states it goes from the base of her neck on the right side and down to the shoulder. Pt states if feels like something is digging in her shoulder and feels tight, like a blood pressure cuff is around the arm. Pt states it feels like her shoulder is disconnected and rates her pain at 7. Pt states she has a history of vertigo and feels like the room is tilting. Pt states that this has been occurring for the past 2 weeks and notices it with standing and with taking Oxydone, but states she also has this without taking the medication. Pt scheduled for appt on 10/28/17 and advised to return call to the office if symptoms become worse. Pt verbalized understanding.  Reason for Disposition . [1] MILD dizziness (e.g., vertigo; walking normally) AND [2] has NOT been evaluated by physician for this . Neck pain present > 2 weeks  Answer Assessment - Initial Assessment Questions 1. ONSET: "When did the pain begin?"      2 weeks ago 2. LOCATION: "Where does it hurt?"      Right side of neck 3. PATTERN "Does the pain come and go, or has it been constant since it started?"      comes and goes 4. SEVERITY: "How bad is the pain?"  (Scale 1-10; or mild, moderate, severe)   - MILD (1-3): doesn't interfere with normal activities    - MODERATE (4-7): interferes with normal activities or awakens from sleep    - SEVERE (8-10):  excruciating pain, unable to do any normal activities      7 5. RADIATION: "Does the pain go anywhere else, shoot into your arms?"     Down to shoulder 6. CORD SYMPTOMS: "Any weakness or numbness of the arms or legs?"     Right arm is weak 7. CAUSE: "What do you think is causing the neck pain?"     Unsure but oncologist states it may by due to most of the nodules being located on the right side 8. NECK  OVERUSE: "Any recent activities that involved turning or twisting the neck?"     No 9. OTHER SYMPTOMS: "Do you have any other symptoms?" (e.g., headache, fever, chest pain, difficulty breathing, neck swelling)     no 10. PREGNANCY: "Is there any chance you are pregnant?" "When was your last menstrual period?"       No change of pregnancy  Answer Assessment - Initial Assessment Questions 1. DESCRIPTION: "Describe your dizziness."     Room tilting with standing comes and comes 2. VERTIGO: "Do you feel like either you or the room is spinning or tilting?"      tilting 3. LIGHTHEADED: "Do you feel lightheaded?" (e.g., somewhat faint, woozy, weak upon standing)     No 4. SEVERITY: "How bad is it?"  "Can you walk?"   - MILD - Feels unsteady but walking normally.   - MODERATE - Feels very unsteady when walking, but not falling; interferes with normal activities (e.g., school, work) .   - SEVERE - Unable to walk without falling (requires assistance).     mild 5. ONSET:  "When did the dizziness begin?"     2 week period 6. AGGRAVATING FACTORS: "Does anything make it worse?" (e.g., standing, change in head position)  Standing or with beginning to walk 7. CAUSE: "What do you think is causing the dizziness?"     Possibly taking from taking furosemide and oxycontin 8. RECURRENT SYMPTOM: "Have you had dizziness before?" If so, ask: "When was the last time?" "What happened that time?"     Has happened 2-3 times in the past 2 weeks 9. OTHER SYMPTOMS: "Do you have any other symptoms?" (e.g., headache, weakness, numbness, vomiting, earache)     No 10. PREGNANCY: "Is there any chance you are pregnant?" "When was your last menstrual period?"       No preganancy  Protocols used: DIZZINESS - VERTIGO-A-AH, NECK PAIN OR STIFFNESS-A-AH

## 2017-10-28 ENCOUNTER — Ambulatory Visit (INDEPENDENT_AMBULATORY_CARE_PROVIDER_SITE_OTHER): Payer: 59 | Admitting: Family Medicine

## 2017-10-28 ENCOUNTER — Encounter: Payer: Self-pay | Admitting: Family Medicine

## 2017-10-28 VITALS — BP 114/70 | HR 75 | Temp 98.1°F | Resp 16 | Ht 66.0 in | Wt 227.0 lb

## 2017-10-28 DIAGNOSIS — G8929 Other chronic pain: Secondary | ICD-10-CM

## 2017-10-28 DIAGNOSIS — M25511 Pain in right shoulder: Secondary | ICD-10-CM

## 2017-10-28 DIAGNOSIS — R42 Dizziness and giddiness: Secondary | ICD-10-CM

## 2017-10-28 DIAGNOSIS — M62838 Other muscle spasm: Secondary | ICD-10-CM

## 2017-10-28 NOTE — Patient Instructions (Signed)
Good to see you today!  Please let me know if the dizziness continues to be an issue I am going to set you up to see Dr. Barbaraann Barthel about your shoulder pain; he is in sports med and works on the 3rd floor here at the med center   Flu shot today

## 2017-11-02 ENCOUNTER — Encounter: Payer: Self-pay | Admitting: Family Medicine

## 2017-11-02 ENCOUNTER — Ambulatory Visit (HOSPITAL_BASED_OUTPATIENT_CLINIC_OR_DEPARTMENT_OTHER)
Admission: RE | Admit: 2017-11-02 | Discharge: 2017-11-02 | Disposition: A | Discharge status: Home / Self Care | Payer: 59 | Source: Ambulatory Visit | Attending: Family Medicine | Admitting: Family Medicine

## 2017-11-02 ENCOUNTER — Ambulatory Visit: Payer: 59 | Admitting: Family Medicine

## 2017-11-02 VITALS — BP 132/91 | HR 71 | Ht 66.0 in | Wt 230.0 lb

## 2017-11-02 DIAGNOSIS — M542 Cervicalgia: Secondary | ICD-10-CM | POA: Insufficient documentation

## 2017-11-02 DIAGNOSIS — M50322 Other cervical disc degeneration at C5-C6 level: Secondary | ICD-10-CM | POA: Insufficient documentation

## 2017-11-02 NOTE — Patient Instructions (Addendum)
You have arthritis of your neck that could be accounting for this pain into the right trapezius and shoulder. These are the different medications you can take for this: Tylenol 574m 1-2 tabs three times a day for pain. Capsaicin, aspercreme, or biofreeze topically up to four times a day may also help with pain. Some supplements that may help for arthritis: Boswellia extract, curcumin, pycnogenol Aleve 1-2 tabs twice a day with food It's important that you continue to stay active. Start home exercises and do these 3 sets of 10 once a day. Consider physical therapy if you don't improve as expected. Heat 15 minutes at a time 3-4 times a day as needed to help with pain. Follow up with me in 1 month to 6 weeks.

## 2017-11-02 NOTE — Progress Notes (Signed)
PCP and consultation requested by: Copland, Gay Filler, MD  Subjective:   HPI: Patient is a 52 y.o. female here for right shoulder pain for 2-3 months.  Patient reports up to 7-8/10 pain.  Her pain initially began more in the neck and trapezius then progressed to the right shoulder pain distally into the arm.  It is worse at night.  She will have some radiation of the pain into the dorsal aspect of her forearm but denies any radiation into the hand.  She has some pain with range of motion, perform daily activities, and driving.  There are no specific movements that seem to worsen her pain.  She denies any numbness or tingling in the hand.  She denies any noticeable weakness.  No bruising or swelling.  She denies any known injuries to her right shoulder.  She has been using heat on her neck which is been helpful.  She previously taking Robaxin at night as well as occasional oxycodone 10 mg.  Past Medical History:  Diagnosis Date  . Adenocarcinoma of left lung, stage 4 ([facility]) 10/28/2016  . Goals of care, counseling/discussion 10/28/2016  . Hypertension     Current Outpatient Medications on File Prior to Visit  Medication Sig Dispense Refill  . ammonium lactate (LAC-HYDRIN) 12 % lotion Apply topically as needed for dry skin. 400 g 0  . clindamycin (CLINDAGEL) 1 % gel Apply topically 2 (two) times daily. 30 g 1  . furosemide (LASIX) 40 MG tablet Take 1 tablet (40 mg total) by mouth daily. 90 tablet 3  . GILOTRIF 40 MG tablet TAKE 1 TABLET DAILY. TAKE ON AN EMPTY STOMACH 1 HOUR BEFORE OR 2 HOURS AFTER A MEALS 30 tablet 1  . loperamide (IMODIUM) 1 MG/5ML solution Take 2 mg as needed by mouth for diarrhea or loose stools.    . magnesium oxide (MAG-OX) 400 (241.3 Mg) MG tablet Take 1 tablet (400 mg total) by mouth daily.    . methocarbamol (ROBAXIN) 500 MG tablet Take 1 tablet (500 mg total) by mouth every 8 (eight) hours as needed for muscle spasms. 30 tablet 1  . Oxycodone HCl 10 MG TABS Take 1 tablet  (10 mg total) by mouth 3 (three) times daily as needed. 45 tablet 0  . pantoprazole (PROTONIX) 40 MG tablet Take 1 tablet (40 mg total) by mouth daily at 12 noon. 90 tablet 3  . potassium chloride SA (K-DUR,KLOR-CON) 20 MEQ tablet Take 1 tablet (20 mEq total) by mouth 2 (two) times daily. 180 tablet 3  . prochlorperazine (COMPAZINE) 10 MG tablet Take 1 tablet (10 mg total) every 6 (six) hours as needed by mouth for nausea or vomiting. 30 tablet 1  . senna-docusate (SENOKOT-S) 8.6-50 MG tablet Take 1 tablet by mouth at bedtime.     No current facility-administered medications on file prior to visit.     Past Surgical History:  Procedure Laterality Date  . BREAST EXCISIONAL BIOPSY Left 20+ yrs ago   benign  . PERICARDIAL WINDOW N/A 01/10/2017   Procedure: PERICARDIAL WINDOW- SUB XYPHOID, RIGHT CHEST TUBE;  Surgeon: Ivin Poot, MD;  Location: [facility];  Service: Thoracic;  Laterality: N/A;  . VIDEO BRONCHOSCOPY Bilateral 10/21/2016   Procedure: VIDEO BRONCHOSCOPY WITH FLUORO;  Surgeon: Tanda Rockers, MD;  Location: [facility];  Service: Cardiopulmonary;  Laterality: Bilateral;    Allergies  Allergen Reactions  . Percocet [Oxycodone-Acetaminophen] Other (See Comments)    "makes me dizzy"  . Lisinopril Cough  . Losartan  Social History   Socioeconomic History  . Marital status: Married    Spouse name: Not on file  . Number of children: Not on file  . Years of education: Not on file  . Highest education level: Not on file  Occupational History  . Not on file  Social Needs  . Financial resource strain: Not on file  . Food insecurity:    Worry: Not on file    Inability: Not on file  . Transportation needs:    Medical: Not on file    Non-medical: Not on file  Tobacco Use  . Smoking status: Never Smoker  . Smokeless tobacco: Never Used  Substance and Sexual Activity  . Alcohol use: Yes    Comment: socially  . Drug use: No  . Sexual activity: Never  Lifestyle  .  Physical activity:    Days per week: Not on file    Minutes per session: Not on file  . Stress: Not on file  Relationships  . Social connections:    Talks on phone: Not on file    Gets together: Not on file    Attends religious service: Not on file    Active member of club or organization: Not on file    Attends meetings of clubs or organizations: Not on file    Relationship status: Not on file  . Intimate partner violence:    Fear of current or ex partner: Not on file    Emotionally abused: Not on file    Physically abused: Not on file    Forced sexual activity: Not on file  Other Topics Concern  . Not on file  Social History Narrative  . Not on file    Family History  Problem Relation Age of Onset  . Asthma Mother   . Stroke Mother   . Hypertension Mother   . Heart attack Father   . Hypertension Father   . Hyperlipidemia Father   . Dementia Father   . Emphysema Maternal Grandmother   . Hypertension Maternal Grandmother   . Colon cancer Paternal Grandmother     BP (!) 132/91   Pulse 71   Ht 5\' 6"  (1.676 m)   Wt 230 lb (104.3 kg)   LMP 03/15/2010   BMI 37.12 kg/m   Review of Systems: See HPI above.     Objective:  Physical Exam:  Gen: awake, alert, NAD, comfortable in exam room Pulm: breathing unlabored  Right shoulder: No obvious deformity or asymmetry. No bruising. No swelling No TTP Full ROM in flexion, abduction, internal/external rotation.  Pain with end range of motion in flexion and abduction. NV intact distally Special Tests:  - Impingement: Equivocal Hawkins. Negative Neers.  - Supraspinatous: Negative empty can. Strength normal/symmetric - Infraspinatous/Teres: Negative external rotation lag. Strength normal/symmetric - Subscapularis: negative belly press, negative bear hug. Strength normal/symmetric - Biceps tendon: Negative Speeds. -No axillary lymphadenopathy  Left Shoulder: No obvious deformity or asymmetry. No bruising. No  swelling No TTP Full ROM, 5/5 strength NV intact distally  Cervical spine: No obvious deformity No tenderness to palpation Full range of motion with mild discomfort on rotation to the right No focal neurologic deficits in the bilateral upper extremities   Assessment & Plan:  1.  Right shoulder pain secondary to cervical radiculopathy - patient shoulder pain was not fully consistent with a primary shoulder cause.  Cervical x-rays were obtained today and independently reviewed.  There is degenerative changes seen at the C5-C6 level causing foraminal  stenosis bilaterally.  Based on her reported symptom distribution, this is consistent with her symptoms.  More importantly there are no obvious bony lesions concerning for metastatic disease seen on x-ray. - Aleve 2 tablets twice daily with food as needed for pain - Heat as needed - Patient provided with home exercises - Follow-up in 1 month.  If not improving will refer to physical therapy - can call sooner if she's not improving and wants to go ahead with this.

## 2017-11-03 ENCOUNTER — Encounter: Payer: Self-pay | Admitting: Family Medicine

## 2017-11-16 ENCOUNTER — Inpatient Hospital Stay: Payer: 59

## 2017-11-16 ENCOUNTER — Encounter: Payer: Self-pay | Admitting: Internal Medicine

## 2017-11-16 ENCOUNTER — Inpatient Hospital Stay: Payer: 59 | Attending: Oncology | Admitting: Internal Medicine

## 2017-11-16 ENCOUNTER — Telehealth: Payer: Self-pay | Admitting: Internal Medicine

## 2017-11-16 VITALS — BP 131/74 | HR 73 | Temp 98.3°F | Resp 18 | Ht 66.0 in | Wt 236.0 lb

## 2017-11-16 DIAGNOSIS — C3490 Malignant neoplasm of unspecified part of unspecified bronchus or lung: Secondary | ICD-10-CM | POA: Diagnosis not present

## 2017-11-16 DIAGNOSIS — Z5111 Encounter for antineoplastic chemotherapy: Secondary | ICD-10-CM

## 2017-11-16 DIAGNOSIS — C3492 Malignant neoplasm of unspecified part of left bronchus or lung: Secondary | ICD-10-CM

## 2017-11-16 LAB — CBC WITH DIFFERENTIAL (CANCER CENTER ONLY)
Abs Immature Granulocytes: 0.03 10*3/uL (ref 0.00–0.07)
Basophils Absolute: 0 10*3/uL (ref 0.0–0.1)
Basophils Relative: 1 %
Eosinophils Absolute: 0.5 10*3/uL (ref 0.0–0.5)
Eosinophils Relative: 6 %
HCT: 42.6 % (ref 36.0–46.0)
Hemoglobin: 13.3 g/dL (ref 12.0–15.0)
Immature Granulocytes: 0 %
Lymphocytes Relative: 28 %
Lymphs Abs: 2.1 10*3/uL (ref 0.7–4.0)
MCH: 29 pg (ref 26.0–34.0)
MCHC: 31.2 g/dL (ref 30.0–36.0)
MCV: 93 fL (ref 80.0–100.0)
Monocytes Absolute: 0.7 10*3/uL (ref 0.1–1.0)
Monocytes Relative: 9 %
Neutro Abs: 4.3 10*3/uL (ref 1.7–7.7)
Neutrophils Relative %: 56 %
Platelet Count: 268 10*3/uL (ref 150–400)
RBC: 4.58 MIL/uL (ref 3.87–5.11)
RDW: 13.3 % (ref 11.5–15.5)
WBC Count: 7.7 10*3/uL (ref 4.0–10.5)
nRBC: 0 % (ref 0.0–0.2)

## 2017-11-16 LAB — CMP (CANCER CENTER ONLY)
ALT: 15 U/L (ref 0–44)
AST: 20 U/L (ref 15–41)
Albumin: 3.6 g/dL (ref 3.5–5.0)
Alkaline Phosphatase: 137 U/L — ABNORMAL HIGH (ref 38–126)
Anion gap: 9 (ref 5–15)
BUN: 15 mg/dL (ref 6–20)
CO2: 29 mmol/L (ref 22–32)
Calcium: 9.5 mg/dL (ref 8.9–10.3)
Chloride: 104 mmol/L (ref 98–111)
Creatinine: 1.09 mg/dL — ABNORMAL HIGH (ref 0.44–1.00)
GFR, Est AFR Am: >60 mL/min (ref >60)
GFR, Estimated: 58 mL/min — ABNORMAL LOW (ref >60)
Glucose, Bld: 96 mg/dL (ref 70–99)
Potassium: 5 mmol/L (ref 3.5–5.1)
Sodium: 142 mmol/L (ref 135–145)
Total Bilirubin: 0.3 mg/dL (ref 0.3–1.2)
Total Protein: 7.2 g/dL (ref 6.5–8.1)

## 2017-11-16 NOTE — Progress Notes (Signed)
[place] [phone]   Fax:(336) [phone]  OFFICE PROGRESS NOTE  Copland, Gay Filler, MD [address] [place] [address]  DIAGNOSIS: DIAGNOSIS: stage IV (T3, N2, M1 a) non-small cell lung cancer, poorly differentiated adenocarcinoma with extensive miliary distribution in the lungs bilaterally diagnosed in September 2018. 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 NEGATIVE for the Exon 20 T790M mutation  PRIOR THERAPY: None.  CURRENT THERAPY: Gilotrif 40 mg daily started 11/08/2016.Status post 12 months of treatment.  INTERVAL HISTORY: Nancy Moore 52 y.o. female returns to the clinic today for follow-up visit.  The patient is feeling fine today with no concerning complaints except for right neck pain.  She had x-ray of the cervical spine that was unremarkable.  She denied having any chest pain, shortness breath, cough or hemoptysis.  She denied having any fever or chills.  She has no nausea, vomiting, diarrhea or constipation.  She has no significant weight loss or night sweats.  The patient is here today for evaluation and repeat blood work.  MEDICAL HISTORY: Past Medical History:  Diagnosis Date  . Adenocarcinoma of left lung, stage 4 ([place]) 10/28/2016  . Goals of care, counseling/discussion 10/28/2016  . Hypertension     ALLERGIES:  is allergic to percocet [oxycodone-acetaminophen]; lisinopril; and losartan.  MEDICATIONS:  Current Outpatient Medications  Medication Sig Dispense Refill  . ammonium lactate (LAC-HYDRIN) 12 % lotion Apply topically as needed for dry skin. 400 g 0  . clindamycin (CLINDAGEL) 1 % gel Apply topically 2 (two) times daily. 30 g 1  . furosemide (LASIX) 40 MG tablet Take 1 tablet (40 mg total) by mouth daily. 90 tablet 3  . GILOTRIF 40 MG tablet TAKE 1 TABLET DAILY. TAKE ON AN EMPTY STOMACH 1 HOUR BEFORE OR 2 HOURS AFTER A MEALS 30 tablet 1  . loperamide (IMODIUM) 1 MG/5ML solution Take 2 mg as needed  by mouth for diarrhea or loose stools.    . magnesium oxide (MAG-OX) 400 (241.3 Mg) MG tablet Take 1 tablet (400 mg total) by mouth daily.    . methocarbamol (ROBAXIN) 500 MG tablet Take 1 tablet (500 mg total) by mouth every 8 (eight) hours as needed for muscle spasms. 30 tablet 1  . Oxycodone HCl 10 MG TABS Take 1 tablet (10 mg total) by mouth 3 (three) times daily as needed. 45 tablet 0  . pantoprazole (PROTONIX) 40 MG tablet Take 1 tablet (40 mg total) by mouth daily at 12 noon. 90 tablet 3  . potassium chloride SA (K-DUR,KLOR-CON) 20 MEQ tablet Take 1 tablet (20 mEq total) by mouth 2 (two) times daily. 180 tablet 3  . prochlorperazine (COMPAZINE) 10 MG tablet Take 1 tablet (10 mg total) every 6 (six) hours as needed by mouth for nausea or vomiting. 30 tablet 1  . senna-docusate (SENOKOT-S) 8.6-50 MG tablet Take 1 tablet by mouth at bedtime.     No current facility-administered medications for this visit.     SURGICAL HISTORY:  Past Surgical History:  Procedure Laterality Date  . BREAST EXCISIONAL BIOPSY Left 20+ yrs ago   benign  . PERICARDIAL WINDOW N/A 01/10/2017   Procedure: PERICARDIAL WINDOW- SUB XYPHOID, RIGHT CHEST TUBE;  Surgeon: Ivin Poot, MD;  Location: [facility];  Service: Thoracic;  Laterality: N/A;  . VIDEO BRONCHOSCOPY Bilateral 10/21/2016   Procedure: VIDEO BRONCHOSCOPY WITH FLUORO;  Surgeon: Tanda Rockers, MD;  Location: [facility];  Service: Cardiopulmonary;  Laterality: Bilateral;    REVIEW OF SYSTEMS:  A comprehensive review of systems was negative except for: Musculoskeletal: positive for neck pain   PHYSICAL EXAMINATION: General appearance: alert, cooperative and no distress Head: Normocephalic, without obvious abnormality, atraumatic Neck: no adenopathy, no JVD, supple, symmetrical, trachea midline and thyroid not enlarged, symmetric, no tenderness/mass/nodules Lymph nodes: Cervical, supraclavicular, and axillary nodes normal. Resp: clear to auscultation  bilaterally Back: symmetric, no curvature. ROM normal. No CVA tenderness. Cardio: regular rate and rhythm, S1, S2 normal, no murmur, click, rub or gallop GI: soft, non-tender; bowel sounds normal; no masses,  no organomegaly Extremities: extremities normal, atraumatic, no cyanosis or edema  ECOG PERFORMANCE STATUS: 1 - Symptomatic but completely ambulatory  Blood pressure 131/74, pulse 73, temperature 98.3 F (36.8 C), temperature source Oral, resp. rate 18, height _0  (1.676 m), weight 236 lb (107 kg), last menstrual period 03/15/2010, SpO2 100 %.  LABORATORY DATA: Lab Results  Component Value Date   WBC 7.7 11/16/2017   HGB 13.3 11/16/2017   HCT 42.6 11/16/2017   MCV 93.0 11/16/2017   PLT 268 11/16/2017      Chemistry      Component Value Date/Time   NA 142 11/16/2017 1014   NA 135 (L) 01/08/2017 1055   K 5.0 11/16/2017 1014   K 3.2 (L) 01/08/2017 1055   CL 104 11/16/2017 1014   CO2 29 11/16/2017 1014   CO2 25 01/08/2017 1055   BUN 15 11/16/2017 1014   BUN 14.6 01/08/2017 1055   CREATININE 1.09 (H) 11/16/2017 1014   CREATININE 1.3 (H) 01/08/2017 1055      Component Value Date/Time   CALCIUM 9.5 11/16/2017 1014   CALCIUM 9.5 01/08/2017 1055   ALKPHOS 137 (H) 11/16/2017 1014   ALKPHOS 82 01/08/2017 1055   AST 20 11/16/2017 1014   AST 31 01/08/2017 1055   ALT 15 11/16/2017 1014   ALT 34 01/08/2017 1055   BILITOT 0.3 11/16/2017 1014   BILITOT 1.74 (H) 01/08/2017 1055       RADIOGRAPHIC STUDIES: Dg Cervical Spine Complete  Result Date: 11/02/2017 CLINICAL DATA:  Right-sided neck pain radiating to the right shoulder and arm over the last 2 months, no injury EXAM: CERVICAL SPINE - COMPLETE 4+ VIEW COMPARISON:  None. FINDINGS: The cervical vertebrae are somewhat straightened in alignment. There is degenerative disc disease diffusely from C3-C7, primarily involving the C5-6 level. There is more loss at that level with spurring present. No fracture is seen. No  prevertebral soft tissue swelling is noted. On oblique views, there is foraminal narrowing bilaterally particularly at the C5-6 level. The odontoid process is intact. The lung apices are clear. IMPRESSION: Normal alignment with degenerative disc disease from C3-C7 primarily involving C5-6 where there is moderate bilateral foraminal narrowing present. Electronically Signed   By: Ivar Drape M.D.   On: 11/02/2017 15:49    ASSESSMENT AND PLAN: This is a very pleasant 52 years old African-American female with metastatic non-small cell lung cancer, adenocarcinoma and positive for EGFR mutation with deletion in exon 19.  The patient is currently on treatment with GILOTRIF 40 mg p.o. daily status post 12 months.  The patient continues to tolerate her treatment with GILOTRIF fairly well except for 1 or 2 episodes of diarrhea on monthly basis.  She takes over-the-counter antidiarrheal medications with improvement of her condition. I recommended for the patient to continue her current treatment with GILOTRIF with the same dose. I will see her back for follow-up visit in  6 weeks for evaluation after repeating CT scan of the chest, abdomen and pelvis for restaging of her disease. The patient was advised to call immediately if she has any concerning symptoms in the interval. The patient voices understanding of current disease status and treatment options and is in agreement with the current care plan. All questions were answered. The patient knows to call the clinic with any problems, questions or concerns. We can certainly see the patient much sooner if necessary.  I spent 10 minutes counseling the patient face to face. The total time spent in the appointment was 15 minutes.  Disclaimer: This note was dictated with voice recognition software. Similar sounding words can inadvertently be transcribed and may not be corrected upon review.

## 2017-11-16 NOTE — Telephone Encounter (Signed)
Scheduled appt per 10/21 los - gave patient AVS and calender per los.  central radiology to contact patient with ct scan.

## 2017-11-17 MED FILL — POTASSIUM CL ER 20 MEQ TAB: 20 | 30 days supply | Qty: 60 | Fill #2 | Status: TO

## 2017-11-17 MED FILL — FUROSEMIDE 40 MG TAB: 40 | 30 days supply | Qty: 30 | Fill #5 | Status: TO

## 2017-11-17 MED FILL — PANTOPRAZOLE SOD DR 40 MG T: 40 | 30 days supply | Qty: 30 | Fill #2 | Status: TO

## 2017-12-03 ENCOUNTER — Ambulatory Visit: Payer: 59 | Admitting: Family Medicine

## 2017-12-12 ENCOUNTER — Other Ambulatory Visit: Payer: Self-pay | Admitting: Internal Medicine

## 2017-12-12 DIAGNOSIS — C3492 Malignant neoplasm of unspecified part of left bronchus or lung: Secondary | ICD-10-CM

## 2017-12-21 ENCOUNTER — Other Ambulatory Visit: Payer: Self-pay | Admitting: Family Medicine

## 2017-12-21 DIAGNOSIS — L853 Xerosis cutis: Secondary | ICD-10-CM

## 2017-12-21 MED FILL — METHOCARBAMOL 500 MG TABLET: 500 | 10 days supply | Qty: 30 | Fill #0

## 2017-12-21 MED FILL — CLINDAMYCIN PH 1% GEL: 1 | 10 days supply | Qty: 30 | Fill #0

## 2017-12-21 MED FILL — FUROSEMIDE 40 MG TAB: 40 | 30 days supply | Qty: 30 | Fill #0

## 2017-12-21 MED FILL — PANTOPRAZOLE SOD DR 40 MG T: 40 | 30 days supply | Qty: 30 | Fill #0

## 2017-12-21 MED FILL — POTASSIUM CHLORIDE CRYS ER: 20 | 30 days supply | Qty: 60 | Fill #0

## 2017-12-25 ENCOUNTER — Encounter (HOSPITAL_COMMUNITY): Payer: Self-pay

## 2017-12-25 ENCOUNTER — Inpatient Hospital Stay: Payer: 59 | Attending: Oncology

## 2017-12-25 ENCOUNTER — Ambulatory Visit (HOSPITAL_COMMUNITY)
Admission: RE | Admit: 2017-12-25 | Discharge: 2017-12-25 | Disposition: A | Discharge status: Home / Self Care | Payer: 59 | Source: Ambulatory Visit | Attending: Internal Medicine | Admitting: Internal Medicine

## 2017-12-25 DIAGNOSIS — C3492 Malignant neoplasm of unspecified part of left bronchus or lung: Secondary | ICD-10-CM | POA: Diagnosis present

## 2017-12-25 DIAGNOSIS — C3490 Malignant neoplasm of unspecified part of unspecified bronchus or lung: Secondary | ICD-10-CM

## 2017-12-25 LAB — CBC WITH DIFFERENTIAL (CANCER CENTER ONLY)
Abs Immature Granulocytes: 0.01 10*3/uL (ref 0.00–0.07)
Basophils Absolute: 0.1 10*3/uL (ref 0.0–0.1)
Basophils Relative: 1 %
Eosinophils Absolute: 0.3 10*3/uL (ref 0.0–0.5)
Eosinophils Relative: 4 %
HCT: 43.2 % (ref 36.0–46.0)
Hemoglobin: 13.4 g/dL (ref 12.0–15.0)
Immature Granulocytes: 0 %
Lymphocytes Relative: 27 %
Lymphs Abs: 2.2 10*3/uL (ref 0.7–4.0)
MCH: 28.8 pg (ref 26.0–34.0)
MCHC: 31 g/dL (ref 30.0–36.0)
MCV: 92.7 fL (ref 80.0–100.0)
Monocytes Absolute: 0.6 10*3/uL (ref 0.1–1.0)
Monocytes Relative: 8 %
Neutro Abs: 4.9 10*3/uL (ref 1.7–7.7)
Neutrophils Relative %: 60 %
Platelet Count: 309 10*3/uL (ref 150–400)
RBC: 4.66 MIL/uL (ref 3.87–5.11)
RDW: 13.8 % (ref 11.5–15.5)
WBC Count: 8.1 10*3/uL (ref 4.0–10.5)
nRBC: 0 % (ref 0.0–0.2)

## 2017-12-25 LAB — CMP (CANCER CENTER ONLY)
ALT: 11 U/L (ref 0–44)
AST: 19 U/L (ref 15–41)
Albumin: 3.9 g/dL (ref 3.5–5.0)
Alkaline Phosphatase: 140 U/L — ABNORMAL HIGH (ref 38–126)
Anion gap: 8 (ref 5–15)
BUN: 18 mg/dL (ref 6–20)
CO2: 30 mmol/L (ref 22–32)
Calcium: 9.7 mg/dL (ref 8.9–10.3)
Chloride: 102 mmol/L (ref 98–111)
Creatinine: 1.08 mg/dL — ABNORMAL HIGH (ref 0.44–1.00)
GFR, Est AFR Am: >60 mL/min (ref >60)
GFR, Estimated: 59 mL/min — ABNORMAL LOW (ref >60)
Glucose, Bld: 78 mg/dL (ref 70–99)
Potassium: 4.4 mmol/L (ref 3.5–5.1)
Sodium: 140 mmol/L (ref 135–145)
Total Bilirubin: 0.5 mg/dL (ref 0.3–1.2)
Total Protein: 7.5 g/dL (ref 6.5–8.1)

## 2017-12-25 MED ORDER — IOHEXOL 300 MG/ML  SOLN
100.0000 mL | Freq: Once | INTRAMUSCULAR | Status: AC | PRN
  Administered 2017-12-25: 100 mL via INTRAVENOUS

## 2017-12-25 MED ORDER — SODIUM CHLORIDE (PF) 0.9 % IJ SOLN
INTRAMUSCULAR | Status: AC
  Filled 2017-12-25: qty 50

## 2017-12-28 ENCOUNTER — Telehealth: Payer: Self-pay

## 2017-12-28 ENCOUNTER — Other Ambulatory Visit: Payer: 59

## 2017-12-28 ENCOUNTER — Encounter: Payer: Self-pay | Admitting: Internal Medicine

## 2017-12-28 ENCOUNTER — Other Ambulatory Visit: Payer: Self-pay | Admitting: Internal Medicine

## 2017-12-28 ENCOUNTER — Encounter: Payer: Self-pay | Admitting: *Deleted

## 2017-12-28 ENCOUNTER — Telehealth: Payer: Self-pay | Admitting: Pharmacist

## 2017-12-28 ENCOUNTER — Inpatient Hospital Stay: Payer: 59

## 2017-12-28 ENCOUNTER — Inpatient Hospital Stay: Payer: 59 | Attending: Oncology | Admitting: Internal Medicine

## 2017-12-28 VITALS — BP 118/69 | HR 59 | Temp 98.0°F | Resp 18 | Ht 66.0 in | Wt 240.5 lb

## 2017-12-28 DIAGNOSIS — R197 Diarrhea, unspecified: Secondary | ICD-10-CM | POA: Diagnosis not present

## 2017-12-28 DIAGNOSIS — Z79899 Other long term (current) drug therapy: Secondary | ICD-10-CM | POA: Diagnosis not present

## 2017-12-28 DIAGNOSIS — C3490 Malignant neoplasm of unspecified part of unspecified bronchus or lung: Secondary | ICD-10-CM

## 2017-12-28 DIAGNOSIS — I10 Essential (primary) hypertension: Secondary | ICD-10-CM

## 2017-12-28 DIAGNOSIS — Z7189 Other specified counseling: Secondary | ICD-10-CM

## 2017-12-28 DIAGNOSIS — C7931 Secondary malignant neoplasm of brain: Secondary | ICD-10-CM | POA: Diagnosis not present

## 2017-12-28 DIAGNOSIS — Z5111 Encounter for antineoplastic chemotherapy: Secondary | ICD-10-CM

## 2017-12-28 DIAGNOSIS — R21 Rash and other nonspecific skin eruption: Secondary | ICD-10-CM

## 2017-12-28 DIAGNOSIS — Z9221 Personal history of antineoplastic chemotherapy: Secondary | ICD-10-CM | POA: Insufficient documentation

## 2017-12-28 DIAGNOSIS — R42 Dizziness and giddiness: Secondary | ICD-10-CM | POA: Diagnosis not present

## 2017-12-28 DIAGNOSIS — C3492 Malignant neoplasm of unspecified part of left bronchus or lung: Secondary | ICD-10-CM | POA: Insufficient documentation

## 2017-12-28 MED ORDER — OSIMERTINIB MESYLATE 80 MG PO TABS: 80.0000 mg | ORAL_TABLET | Freq: Every day | ORAL | 2 refills | Status: DC

## 2017-12-28 NOTE — Telephone Encounter (Signed)
Oral Oncology Pharmacist Encounter  Received new prescription for Tagrisso (osimertinib) for the treatment of stage IV non-small cell lung cancer with known EGFR mutation (exon 19 deletion), planned duration until disease progression or unacceptable toxicity.  Patient started on treatment with Gilotrif (afatinib) on 11/08/16, being discontinued due to intolerable side effects  Labs from 12/25/2017 assessed, okay for treatment. SCr=1.08, est CrCl~100 mL/min EKG performed today (12/28/17) shows QTc 421 msec, safe for Tagrisso initiation  Current medication list in Epic reviewed, no significant DDIs with Tagrisso identified.  Prescription has been e-scribed to the [facility] for benefits analysis and approval.  Oral Oncology Clinic will continue to follow for insurance authorization, copayment issues, initial counseling and start date.  Johny Drilling, PharmD, BCPS, BCOP  12/28/2017 12:30 PM Oral Oncology Clinic [phone]

## 2017-12-28 NOTE — Progress Notes (Signed)
[place] Telephone:(336) [phone]   Fax:(336) [phone]  OFFICE PROGRESS NOTE  Copland, Gay Filler, MD [address] [place] [address]  DIAGNOSIS: DIAGNOSIS: stage IV (T3, N2, M1 a) non-small cell lung cancer, poorly differentiated adenocarcinoma with extensive miliary distribution in the lungs bilaterally diagnosed in September 2018. 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 NEGATIVE for the Exon 20 T790M mutation  PRIOR THERAPY: Gilotrif 40 mg daily started 11/08/2016.Status post 13 months of treatment.  CURRENT THERAPY:  Tagrisso 80 mg p.o. daily.  Expected to start in the next few days..  INTERVAL HISTORY: Nancy Moore 52 y.o. female returns to the clinic today for follow-up visit.  The patient is feeling fine today with no concerning complaints except for new dizzy spells and feeling vertigo-like symptoms.  She denied having any current chest pain, shortness of breath, cough or hemoptysis.  She denied having any fever or chills.  She has no nausea, vomiting, constipation but has few episodes of diarrhea and skin rash.  The patient denied having any significant weight loss or night sweats.  She has no fever or chills.  She had repeat CT scan of the chest, abdomen and pelvis performed recently and she is here for evaluation and discussion of her risk her results.  MEDICAL HISTORY: Past Medical History:  Diagnosis Date  . Adenocarcinoma of left lung, stage 4 ([place]) 10/28/2016  . Goals of care, counseling/discussion 10/28/2016  . Hypertension     ALLERGIES:  is allergic to percocet [oxycodone-acetaminophen]; lisinopril; and losartan.  MEDICATIONS:  Current Outpatient Medications  Medication Sig Dispense Refill  . ammonium lactate (LAC-HYDRIN) 12 % lotion Apply topically as needed for dry skin. 400 g 0  . clindamycin (CLINDAGEL) 1 % gel APPLY TOPICALLY 2 TIMES DAILY. 30 g 1  . furosemide (LASIX) 40 MG tablet Take 1 tablet (40 mg total) by  mouth daily. 90 tablet 3  . GILOTRIF 40 MG tablet TAKE 1 TABLET DAILY ON AN EMPTY STOMACH, 1 HOUR BEFORE OR 2 HOURS AFTER A MEAL 30 tablet 0  . loperamide (IMODIUM) 1 MG/5ML solution Take 2 mg as needed by mouth for diarrhea or loose stools.    . magnesium oxide (MAG-OX) 400 (241.3 Mg) MG tablet Take 1 tablet (400 mg total) by mouth daily.    . methocarbamol (ROBAXIN) 500 MG tablet Take 1 tablet (500 mg total) by mouth every 8 (eight) hours as needed for muscle spasms. 30 tablet 1  . Oxycodone HCl 10 MG TABS Take 1 tablet (10 mg total) by mouth 3 (three) times daily as needed. 45 tablet 0  . pantoprazole (PROTONIX) 40 MG tablet Take 1 tablet (40 mg total) by mouth daily at 12 noon. 90 tablet 3  . potassium chloride SA (K-DUR,KLOR-CON) 20 MEQ tablet Take 1 tablet (20 mEq total) by mouth 2 (two) times daily. 180 tablet 3  . prochlorperazine (COMPAZINE) 10 MG tablet Take 1 tablet (10 mg total) every 6 (six) hours as needed by mouth for nausea or vomiting. 30 tablet 1  . senna-docusate (SENOKOT-S) 8.6-50 MG tablet Take 1 tablet by mouth at bedtime.     No current facility-administered medications for this visit.     SURGICAL HISTORY:  Past Surgical History:  Procedure Laterality Date  . BREAST EXCISIONAL BIOPSY Left 20+ yrs ago   benign  . PERICARDIAL WINDOW N/A 01/10/2017   Procedure: PERICARDIAL WINDOW- SUB XYPHOID, RIGHT CHEST TUBE;  Surgeon: Ivin Poot,  MD;  Location: [facility];  Service: Thoracic;  Laterality: N/A;  . VIDEO BRONCHOSCOPY Bilateral 10/21/2016   Procedure: VIDEO BRONCHOSCOPY WITH FLUORO;  Surgeon: Tanda Rockers, MD;  Location: [facility];  Service: Cardiopulmonary;  Laterality: Bilateral;    REVIEW OF SYSTEMS:  Constitutional: positive for fatigue Eyes: negative Ears, nose, mouth, throat, and face: negative Respiratory: negative Cardiovascular: negative Gastrointestinal: positive for diarrhea Genitourinary:negative Integument/breast:  negative Hematologic/lymphatic: negative Musculoskeletal:negative Neurological: positive for dizziness Behavioral/Psych: negative Endocrine: negative Allergic/Immunologic: negative   PHYSICAL EXAMINATION: General appearance: alert, cooperative and no distress Head: Normocephalic, without obvious abnormality, atraumatic Neck: no adenopathy, no JVD, supple, symmetrical, trachea midline and thyroid not enlarged, symmetric, no tenderness/mass/nodules Lymph nodes: Cervical, supraclavicular, and axillary nodes normal. Resp: clear to auscultation bilaterally Back: symmetric, no curvature. ROM normal. No CVA tenderness. Cardio: regular rate and rhythm, S1, S2 normal, no murmur, click, rub or gallop GI: soft, non-tender; bowel sounds normal; no masses,  no organomegaly Extremities: extremities normal, atraumatic, no cyanosis or edema Neurologic: Alert and oriented X 3, normal strength and tone. Normal symmetric reflexes. Normal coordination and gait  ECOG PERFORMANCE STATUS: 1 - Symptomatic but completely ambulatory  Blood pressure 118/69, pulse (!) 59, temperature 98 F (36.7 C), temperature source Oral, resp. rate 18, height '5\' 6"'  (1.676 m), weight 240 lb 8 oz (109.1 kg), last menstrual period 03/15/2010, SpO2 99 %.  LABORATORY DATA: Lab Results  Component Value Date   WBC 8.1 12/25/2017   HGB 13.4 12/25/2017   HCT 43.2 12/25/2017   MCV 92.7 12/25/2017   PLT 309 12/25/2017      Chemistry      Component Value Date/Time   NA 140 12/25/2017 1037   NA 135 (L) 01/08/2017 1055   K 4.4 12/25/2017 1037   K 3.2 (L) 01/08/2017 1055   CL 102 12/25/2017 1037   CO2 30 12/25/2017 1037   CO2 25 01/08/2017 1055   BUN 18 12/25/2017 1037   BUN 14.6 01/08/2017 1055   CREATININE 1.08 (H) 12/25/2017 1037   CREATININE 1.3 (H) 01/08/2017 1055      Component Value Date/Time   CALCIUM 9.7 12/25/2017 1037   CALCIUM 9.5 01/08/2017 1055   ALKPHOS 140 (H) 12/25/2017 1037   ALKPHOS 82 01/08/2017 1055    AST 19 12/25/2017 1037   AST 31 01/08/2017 1055   ALT 11 12/25/2017 1037   ALT 34 01/08/2017 1055   BILITOT 0.5 12/25/2017 1037   BILITOT 1.74 (H) 01/08/2017 1055       RADIOGRAPHIC STUDIES: Ct Chest W Contrast  Result Date: 12/25/2017 CLINICAL DATA:  Patient with history of lung cancer diagnosed in 2018. Patient on chemotherapy. Follow-up exam. EXAM: CT CHEST, ABDOMEN, AND PELVIS WITH CONTRAST TECHNIQUE: Multidetector CT imaging of the chest, abdomen and pelvis was performed following the standard protocol during bolus administration of intravenous contrast. CONTRAST:  168m OMNIPAQUE IOHEXOL 300 MG/ML  SOLN COMPARISON:  CT CAP 09/30/2017 FINDINGS: CT CHEST FINDINGS Cardiovascular: Stable heart size. Small pericardial effusion. Aorta main pulmonary artery normal in caliber. Mediastinum/Nodes: No enlarged axillary, mediastinal or hilar lymphadenopathy. Stable 6 mm prevascular lymph node (image 20; series 2). Visualized thyroid is unremarkable. Lungs/Pleura: Central airways are patent. Interval increase in peribronchovascular nodularity throughout the lungs bilaterally with an upper lobe predominance. Additionally there are multiple new and enlarging pulmonary nodules throughout the lungs bilaterally. Reference nodule within the peripheral aspect of the lingula measures 1.4 x 1.0 cm (image 62; series 7), previously 0.4 x 0.4 cm. Reference new  5 mm left lower lobe nodule (image 113; series 7). There is a new 1.5 x 1.2 cm nodule superior segment right lower lobe (image 49; series 7). There is a new 0.7 x 0.6 cm nodule right upper lobe (image 40; series 7). No pleural effusion or pneumothorax. Musculoskeletal: Stable sclerotic focus within the posterior T3 vertebral body (image 100; series 6). CT ABDOMEN PELVIS FINDINGS Hepatobiliary: Liver is normal in size and contour. Gallbladder is unremarkable. No intrahepatic or extrahepatic biliary ductal dilatation. Pancreas: Unremarkable Spleen: Unremarkable  Adrenals/Urinary Tract: Normal adrenal glands. Kidneys enhance symmetrically with contrast. Right extrarenal pelvis. Normal appearance of the urinary bladder. Stomach/Bowel: No abnormal bowel wall thickening or evidence for bowel obstruction. No free fluid or free intraperitoneal air. Normal morphology of the stomach. Vascular/Lymphatic: Normal caliber abdominal aorta. Peripheral calcified atherosclerotic plaque. No retroperitoneal lymphadenopathy. Reproductive: Status post hysterectomy. Other: None. Musculoskeletal: Lumbar spine degenerative changes. No aggressive or acute appearing osseous lesions. IMPRESSION: 1. Interval increase in size and number of pulmonary nodularity concerning for disease progression. 2. Similar-appearing T3 sclerotic lesion. Electronically Signed   By: Lovey Newcomer M.D.   On: 12/25/2017 13:24   Ct Abdomen Pelvis W Contrast  Result Date: 12/25/2017 CLINICAL DATA:  Patient with history of lung cancer diagnosed in 2018. Patient on chemotherapy. Follow-up exam. EXAM: CT CHEST, ABDOMEN, AND PELVIS WITH CONTRAST TECHNIQUE: Multidetector CT imaging of the chest, abdomen and pelvis was performed following the standard protocol during bolus administration of intravenous contrast. CONTRAST:  130m OMNIPAQUE IOHEXOL 300 MG/ML  SOLN COMPARISON:  CT CAP 09/30/2017 FINDINGS: CT CHEST FINDINGS Cardiovascular: Stable heart size. Small pericardial effusion. Aorta main pulmonary artery normal in caliber. Mediastinum/Nodes: No enlarged axillary, mediastinal or hilar lymphadenopathy. Stable 6 mm prevascular lymph node (image 20; series 2). Visualized thyroid is unremarkable. Lungs/Pleura: Central airways are patent. Interval increase in peribronchovascular nodularity throughout the lungs bilaterally with an upper lobe predominance. Additionally there are multiple new and enlarging pulmonary nodules throughout the lungs bilaterally. Reference nodule within the peripheral aspect of the lingula measures 1.4  x 1.0 cm (image 62; series 7), previously 0.4 x 0.4 cm. Reference new 5 mm left lower lobe nodule (image 113; series 7). There is a new 1.5 x 1.2 cm nodule superior segment right lower lobe (image 49; series 7). There is a new 0.7 x 0.6 cm nodule right upper lobe (image 40; series 7). No pleural effusion or pneumothorax. Musculoskeletal: Stable sclerotic focus within the posterior T3 vertebral body (image 100; series 6). CT ABDOMEN PELVIS FINDINGS Hepatobiliary: Liver is normal in size and contour. Gallbladder is unremarkable. No intrahepatic or extrahepatic biliary ductal dilatation. Pancreas: Unremarkable Spleen: Unremarkable Adrenals/Urinary Tract: Normal adrenal glands. Kidneys enhance symmetrically with contrast. Right extrarenal pelvis. Normal appearance of the urinary bladder. Stomach/Bowel: No abnormal bowel wall thickening or evidence for bowel obstruction. No free fluid or free intraperitoneal air. Normal morphology of the stomach. Vascular/Lymphatic: Normal caliber abdominal aorta. Peripheral calcified atherosclerotic plaque. No retroperitoneal lymphadenopathy. Reproductive: Status post hysterectomy. Other: None. Musculoskeletal: Lumbar spine degenerative changes. No aggressive or acute appearing osseous lesions. IMPRESSION: 1. Interval increase in size and number of pulmonary nodularity concerning for disease progression. 2. Similar-appearing T3 sclerotic lesion. Electronically Signed   By: DLovey NewcomerM.D.   On: 12/25/2017 13:24    ASSESSMENT AND PLAN: This is a very pleasant 52years old African-American female with metastatic non-small cell lung cancer, adenocarcinoma and positive for EGFR mutation with deletion in exon 19.  The patient is currently on  treatment with GILOTRIF 40 mg p.o. daily status post 13 months.  The patient is tolerating this treatment well except for few episodes of diarrhea and mild skin rash. She is complaining of dizzy spells and vertigo recently. Repeat CT scan of the  chest, abdomen and pelvis performed recently showed some evidence for disease progression with increase in size and number of pulmonary nodules. I personally and independently reviewed the scan images and discussed the result and showed the images to the patient today. I recommended for the patient to have repeat molecular studies by [facility] to rule out the development of new mutations. I will also switch her treatment to Tagrisso 80 mg p.o. daily. For the new dizzy spells, I will order MRI of the brain to rule out brain metastasis. I will see the patient back for follow-up visit in 3 weeks for evaluation and repeat blood work for managing any adverse effect of her treatment. She was advised to call immediately if she has any concerning symptoms in the interval. The patient voices understanding of current disease status and treatment options and is in agreement with the current care plan. All questions were answered. The patient knows to call the clinic with any problems, questions or concerns. We can certainly see the patient much sooner if necessary.  Disclaimer: This note was dictated with voice recognition software. Similar sounding words can inadvertently be transcribed and may not be corrected upon review.

## 2017-12-28 NOTE — Telephone Encounter (Signed)
Printed avs and calender of upcoming appointment. Per 12/2 los

## 2017-12-28 NOTE — Progress Notes (Signed)
Oncology Nurse Navigator Documentation  Oncology Nurse Navigator Flowsheets 12/28/2017  Navigator Location [facility]  Navigator Encounter Type Clinic/MDC/I spoke with patient today at clinic.  Per Dr. Julien Nordmann, patient has disease progression with medication intolerance.  Patient treatment plan is different oral medication for her cancer.  I spoke with patient about this.  I also updated CSW on treatment change.   Per Dr. Julien Nordmann, completed forms for blood test.   Patient Visit Type MedOnc  Treatment Phase Treatment  Barriers/Navigation Needs Education;Coordination of Care  Education Other  Interventions Coordination of Care;Education  Coordination of Care Appts;Other  Education Method Verbal  Acuity Level 2  Time Spent with Patient 30

## 2017-12-28 NOTE — Telephone Encounter (Signed)
Oral Chemotherapy Pharmacist Encounter   I spoke with patient in exam room for overview of: Tagrisso (osimertinib) for the treatment of stage IV non-small cell lung cancer with known EGFR mutation (exon 19 deletion), planned duration until disease progression or unacceptable toxicity.  Patient started on treatment with Gilotrif (afatinib) on 11/08/16, being discontinued due to intolerable side effects  Counseled patient on administration, dosing, side effects, monitoring, drug-food interactions, safe handling, storage, and disposal.  Patient will take Tagrisso 80 tablets, 1 tablet by mouth once daily, without regard to food.  Tagrisso start date: TBD, pending medication acquisition Patient knows to discontinue use of Gilotrif once she acquires Tagrisso. She will take her last dose of Gilotrif and then start [name] the following day once Tagrisso is required.  Adverse effects include but are not limited to: diarrhea, mouth sores, decreased appetitie, fatigue, dry skin, rash, nail changes, altered cardiac conduction, and decreased blood counts or electrolytes.  Patient will obtain anti diarrheal and alert the office of 4 or more loose stools above baseline.   Reviewed with patient importance of keeping a medication schedule and plan for any missed doses.  [name] voiced understanding and appreciation.   All questions answered. Medication reconciliation performed and medication/allergy list updated.  Will follow up with patient regarding insurance and pharmacy once insurance authorization is obtained.   Patient knows to call the office with questions or concerns. Oral Oncology Clinic will continue to follow.  Nancy Moore, PharmD, BCPS, BCOP  12/28/2017   1:20 PM Oral Oncology Clinic [phone]

## 2017-12-28 NOTE — Telephone Encounter (Signed)
Oral Oncology Patient Advocate Encounter  Prior Authorization for Nancy Moore has been approved.    PA# [id number] Effective dates: 12/28/17 through 12/29/18  Oral Oncology Clinic will continue to follow.   [place] Patient [name] Phone [phone] Fax [phone]

## 2017-12-28 NOTE — Telephone Encounter (Signed)
Oral Oncology Patient Advocate Encounter  Received notification from [facility] that prior authorization for Tagrisso is required.  PA submitted on CoverMyMeds Key [id number] Status is pending  Oral Oncology Clinic will continue to follow.  [place] Patient [place] Phone [phone] Fax [phone]

## 2017-12-28 NOTE — Telephone Encounter (Signed)
Oral Oncology Pharmacist Encounter  Received notification of insurance authorization of Tagrisso. Tagrisso prescriptions must be filled at [facility] per insurance requirement. Prescription has been E scribed to the [facility] location in [place], [place].  Supporting information for patient's prescription has been faxed to dispensing pharmacy. Contact at [facility] has also been emailed for notification of new prescription.  I called and left voicemail for patient with dispensing pharmacy information as well as provided phone number for dispensing pharmacy of [phone]  Nancy Moore, PharmD, BCPS, BCOP  12/28/2017 3:54 PM [facility] [phone]

## 2017-12-29 NOTE — Telephone Encounter (Signed)
Oral Oncology Pharmacist Encounter  Received call from patient with concerns about copayment for [place]. Patient states that her copayment for Gilotrif was expensive and the patient assistance department at [facility] had previously secured her a grant to reduce out-of-pocket cost for her Gilotrif.  Patient does have Pharmacist, community and will be eligible for manufacturer copayment card from [employer] to reduce her out-of-pocket cost for the [place].  Patient states she has 4 remaining tablets of Gilotrif left.  I have reached out to [facility] for an update on her prescription. It is currently in process and they will provide another update when they are able.  Johny Drilling, PharmD, BCPS, BCOP  12/29/2017 11:46 AM Oral Oncology Clinic [phone]

## 2017-12-30 ENCOUNTER — Encounter: Payer: Self-pay | Admitting: Medical Oncology

## 2018-01-01 ENCOUNTER — Telehealth: Payer: Self-pay | Admitting: *Deleted

## 2018-01-01 NOTE — Telephone Encounter (Signed)
Oncology Nurse Navigator Documentation  Oncology Nurse Navigator Flowsheets 01/01/2018  Navigator Location [facility]  Navigator Encounter Type Telephone/I called and spoke with Nancy Moore today.  She updated me that her Tigrisso came and her 1st dose will be today.  I update Dr. Julien Nordmann, nurses, and Johny Drilling that her first dose is today.   I also asked her if I could give her number out to another patient who would like to connect and talk about lung cancer. She is ok with that.   Ms. Langhans MRI is scheduled next week and she is aware of appt for that and follow up with DrJulien Nordmann.  I offered her support and encouragement.   Telephone Outgoing Call  Treatment Phase Treatment  Barriers/Navigation Needs Education  Education Other  Interventions Education  Education Method Verbal  Acuity Level 2  Time Spent with Patient 30

## 2018-01-04 ENCOUNTER — Telehealth: Payer: Self-pay

## 2018-01-04 NOTE — Telephone Encounter (Signed)
Oral Oncology Patient Advocate Encounter  I confirmed with [place] that Tagrisso was delivered 01/01/18  [place] Patient [place] Phone [phone] Fax [phone]

## 2018-01-05 ENCOUNTER — Ambulatory Visit (HOSPITAL_COMMUNITY)
Admission: RE | Admit: 2018-01-05 | Discharge: 2018-01-05 | Disposition: A | Discharge status: Home / Self Care | Payer: 59 | Source: Ambulatory Visit | Attending: Internal Medicine | Admitting: Internal Medicine

## 2018-01-05 DIAGNOSIS — C3490 Malignant neoplasm of unspecified part of unspecified bronchus or lung: Secondary | ICD-10-CM

## 2018-01-05 MED ORDER — GADOBUTROL 1 MMOL/ML IV SOLN
10.0000 mL | Freq: Once | INTRAVENOUS | Status: AC | PRN
  Administered 2018-01-05: 10 mL via INTRAVENOUS

## 2018-01-07 ENCOUNTER — Other Ambulatory Visit: Payer: Self-pay | Admitting: Medical Oncology

## 2018-01-07 ENCOUNTER — Other Ambulatory Visit: Payer: Self-pay | Admitting: Internal Medicine

## 2018-01-07 NOTE — Progress Notes (Unsigned)
CHART NOTE I called Nancy Moore and informed her about the results of the MRI of the brain.  It showed innumerable small brain metastasis.  I discussed with the patient the treatment options including continuous monitoring on her current treatment with Tagrisso with repeat imaging studies in around 6 weeks to see if she is responding in the brain to this treatment which cross the blood-brain barrier versus referral to radiation oncology for whole brain irradiation.  The patient mentioned that she is currently feeling much better and her symptoms are less.  We will continue with observation for now hoping that [name] may help her brain metastasis.  She is scheduled to come back for follow-up visit in January 18, 2018 and I will discuss this with her in more details.  The patient was also advised to call immediately if she has any concerning symptoms.

## 2018-01-08 LAB — GUARDANT 360

## 2018-01-18 ENCOUNTER — Inpatient Hospital Stay (HOSPITAL_BASED_OUTPATIENT_CLINIC_OR_DEPARTMENT_OTHER): Payer: 59 | Admitting: Internal Medicine

## 2018-01-18 ENCOUNTER — Inpatient Hospital Stay: Payer: 59

## 2018-01-18 ENCOUNTER — Telehealth: Payer: Self-pay | Admitting: Internal Medicine

## 2018-01-18 ENCOUNTER — Encounter: Payer: Self-pay | Admitting: Internal Medicine

## 2018-01-18 ENCOUNTER — Other Ambulatory Visit: Payer: Self-pay | Admitting: Family Medicine

## 2018-01-18 VITALS — BP 146/80 | HR 70 | Temp 98.2°F | Resp 17 | Ht 66.0 in | Wt 238.9 lb

## 2018-01-18 DIAGNOSIS — C7931 Secondary malignant neoplasm of brain: Secondary | ICD-10-CM

## 2018-01-18 DIAGNOSIS — I10 Essential (primary) hypertension: Secondary | ICD-10-CM | POA: Diagnosis not present

## 2018-01-18 DIAGNOSIS — Z5111 Encounter for antineoplastic chemotherapy: Secondary | ICD-10-CM

## 2018-01-18 DIAGNOSIS — Z9221 Personal history of antineoplastic chemotherapy: Secondary | ICD-10-CM

## 2018-01-18 DIAGNOSIS — M62838 Other muscle spasm: Secondary | ICD-10-CM

## 2018-01-18 DIAGNOSIS — C3492 Malignant neoplasm of unspecified part of left bronchus or lung: Secondary | ICD-10-CM

## 2018-01-18 DIAGNOSIS — Z79899 Other long term (current) drug therapy: Secondary | ICD-10-CM

## 2018-01-18 DIAGNOSIS — C3490 Malignant neoplasm of unspecified part of unspecified bronchus or lung: Secondary | ICD-10-CM

## 2018-01-18 LAB — CBC WITH DIFFERENTIAL (CANCER CENTER ONLY)
Abs Immature Granulocytes: 0.02 10*3/uL (ref 0.00–0.07)
Basophils Absolute: 0 10*3/uL (ref 0.0–0.1)
Basophils Relative: 0 %
Eosinophils Absolute: 0.3 10*3/uL (ref 0.0–0.5)
Eosinophils Relative: 3 %
HCT: 41.5 % (ref 36.0–46.0)
Hemoglobin: 13 g/dL (ref 12.0–15.0)
Immature Granulocytes: 0 %
Lymphocytes Relative: 25 %
Lymphs Abs: 2 10*3/uL (ref 0.7–4.0)
MCH: 29.1 pg (ref 26.0–34.0)
MCHC: 31.3 g/dL (ref 30.0–36.0)
MCV: 93 fL (ref 80.0–100.0)
Monocytes Absolute: 0.6 10*3/uL (ref 0.1–1.0)
Monocytes Relative: 7 %
Neutro Abs: 5.1 10*3/uL (ref 1.7–7.7)
Neutrophils Relative %: 65 %
Platelet Count: 203 10*3/uL (ref 150–400)
RBC: 4.46 MIL/uL (ref 3.87–5.11)
RDW: 13.9 % (ref 11.5–15.5)
WBC Count: 8 10*3/uL (ref 4.0–10.5)
nRBC: 0 % (ref 0.0–0.2)

## 2018-01-18 LAB — CMP (CANCER CENTER ONLY)
ALT: 13 U/L (ref 0–44)
AST: 18 U/L (ref 15–41)
Albumin: 3.9 g/dL (ref 3.5–5.0)
Alkaline Phosphatase: 113 U/L (ref 38–126)
Anion gap: 10 (ref 5–15)
BUN: 15 mg/dL (ref 6–20)
CO2: 28 mmol/L (ref 22–32)
Calcium: 9.3 mg/dL (ref 8.9–10.3)
Chloride: 103 mmol/L (ref 98–111)
Creatinine: 1.27 mg/dL — ABNORMAL HIGH (ref 0.44–1.00)
GFR, Est AFR Am: 57 mL/min — ABNORMAL LOW (ref >60)
GFR, Estimated: 49 mL/min — ABNORMAL LOW (ref >60)
Glucose, Bld: 96 mg/dL (ref 70–99)
Potassium: 4.2 mmol/L (ref 3.5–5.1)
Sodium: 141 mmol/L (ref 135–145)
Total Bilirubin: 0.5 mg/dL (ref 0.3–1.2)
Total Protein: 7.4 g/dL (ref 6.5–8.1)

## 2018-01-18 MED FILL — METHOCARBAMOL 500 MG TABLET: 500 | 10 days supply | Qty: 30 | Fill #0

## 2018-01-18 MED FILL — POTASSIUM CHLORIDE CRYS ER: 20 | 30 days supply | Qty: 60 | Fill #1

## 2018-01-18 MED FILL — FUROSEMIDE 40 MG TAB: 40 | 30 days supply | Qty: 30 | Fill #1

## 2018-01-18 MED FILL — PANTOPRAZOLE SOD DR 40 MG T: 40 | 30 days supply | Qty: 30 | Fill #1

## 2018-01-18 MED FILL — CLINDAMYCIN PH 1% GEL: 1 | 10 days supply | Qty: 30 | Fill #1

## 2018-01-18 NOTE — Telephone Encounter (Signed)
Gave patient avs report and appointments for January

## 2018-01-18 NOTE — Progress Notes (Signed)
[facility] [phone]   [phone]  OFFICE PROGRESS NOTE  Nancy Moore, Nancy Filler, MD [address] [place] [address]  DIAGNOSIS: DIAGNOSIS: stage IV (T3, N2, M1 a) non-small cell lung cancer, poorly differentiated adenocarcinoma with extensive miliary distribution in the lungs bilaterally diagnosed in September 2018. 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 NEGATIVE for the Exon 20 T790M mutation. Repeat molecular studies by guardant 360 at time of progression showed ATM [id number]  PRIOR THERAPY: Gilotrif 40 mg daily started 11/08/2016.Status post 13 months of treatment.  CURRENT THERAPY:  Tagrisso 80 mg p.o. daily.  First dose started January 01, 2018  INTERVAL HISTORY: [name] 52y.o. female returns to the clinic today for follow-up visit accompanied by her sister.  The patient is feeling fine today with no concerning complaints.  She has improvement of the dizzy spells.  She denied having any current chest pain, shortness of breath, cough or hemoptysis.  She denied having any fever or chills.  She has no nausea, vomiting, diarrhea or constipation.  She denied having any significant skin rash.  She has been tolerating her treatment with Tagrisso fairly well.  She also had MRI of the brain performed recently and she is here for evaluation and discussion of her lab and scan results.  MEDICAL HISTORY: Past Medical History:  Diagnosis Date  . Aden[place] left lung, stage 4 [place] 10[place]  . Goals of care, counseling/discussion 10/28/2016  . Hypertension     ALLERGIES:  is allergic to percocet [oxycodone-acetaminophen]; lisinopril; and losartan.  MEDICATIONS:  Current Outpatient Medications  Medication Sig Dispense Refill  . ammonium lactate (LAC-HYDRIN) 12 % lotion Apply topically as needed for dry skin. 400 g 0  . clindamycin (CLINDAGEL) 1 % gel APPLY TOPICALLY 2 TIMES DAILY. 30 g 1  . furosemide (LASIX) 40 MG  tablet Take 1 tablet (40 mg total) by mouth daily. 90 tablet 3  . GILOTRIF 40 MG tablet TAKE 1 TABLET DAILY ON AN EMPTY STOMACH, 1 HOUR BEFORE OR 2 HOURS AFTER A MEAL 30 tablet 0  . loperamide (IMODIUM) 1 MG/5ML solution Take 2 mg as needed by mouth for diarrhea or loose stools.    . magnesium oxide (MAG-OX) 400 (241.3 Mg) MG tablet Take 1 tablet (400 mg total) by mouth daily.    . methocarbamol (ROBAXIN) 500 MG tablet Take 1 tablet (500 mg total) by mouth every 8 (eight) hours as needed for muscle spasms. 30 tablet 1  . osimertinib mesylate (TAGRISSO) 80 MG tablet Take 1 tablet (80 mg total) by mouth daily. 30 tablet 2  . Oxycodone HCl 10 MG TABS Take 1 tablet (10 mg total) by mouth 3 (three) times daily as needed. 45 tablet 0  . pantoprazole (PROTONIX) 40 MG tablet Take 1 tablet (40 mg total) by mouth daily at 12 noon. 90 tablet 3  . potassium chloride SA (K-DUR,KLOR-CON) 20 MEQ tablet Take 1 tablet (20 mEq total) by mouth 2 (two) times daily. 180 tablet 3  . prochlorperazine (COMPAZINE) 10 MG tablet Take 1 tablet (10 mg total) every 6 (six) hours as needed by mouth for nausea or vomiting. 30 tablet 1  . senna-docusate (SENOKOT-S) 8.6-50 MG tablet Take 1 tablet by mouth at bedtime.     No current facility-administered medications for this visit.     SURGICAL HISTORY:  Past Surgical History:  Procedure Laterality Date  . BREAST EXCISIONAL BIOPSY Left 20+ yrs ago  benign  . PERICARDIAL WINDOW N/A 01/10/2017   Procedure: PERICARDIAL WINDOW- SUB XYPHOID, RIGHT CHEST TUBE;  Surgeon: Ivin Poot, MD;  Location: [facility];  Service: Thoracic;  Laterality: N/A;  . VIDEO BRONCHOSCOPY Bilateral 10/21/2016   Procedure: VIDEO BRONCHOSCOPY WITH FLUORO;  Surgeon: Tanda Rockers, MD;  Location: [facility];  Service: Cardiopulmonary;  Laterality: Bilateral;    REVIEW OF SYSTEMS:  A comprehensive review of systems was negative except for: Constitutional: positive for fatigue   PHYSICAL EXAMINATION:  General appearance: alert, cooperative, fatigued and no distress Head: Normocephalic, without obvious abnormality, atraumatic Neck: no adenopathy, no JVD, supple, symmetrical, trachea midline and thyroid not enlarged, symmetric, no tenderness/mass/nodules Lymph nodes: Cervical, supraclavicular, and axillary nodes normal. Resp: clear to auscultation bilaterally Back: symmetric, no curvature. ROM normal. No CVA tenderness. Cardio: regular rate and rhythm, S1, S2 normal, no murmur, click, rub or gallop GI: soft, non-tender; bowel sounds normal; no masses,  no organomegaly Extremities: extremities normal, atraumatic, no cyanosis or edema  ECOG PERFORMANCE STATUS: 1 - Symptomatic but completely ambulatory  Blood pressure (!) 146/80, pulse 70, temperature 98.2 F (36.8 C), temperature source Oral, resp. rate 17, height '5\' 6"'  (1.676 m), weight 238 lb 14.4 oz (108.4 kg), last menstrual period 03/15/2010, SpO2 99 %.  LABORATORY DATA: Lab Results  Component Value Date   WBC 8.0 01/18/2018   HGB 13.0 01/18/2018   HCT 41.5 01/18/2018   MCV 93.0 01/18/2018   PLT 203 01/18/2018      Chemistry      Component Value Date/Time   NA 141 01/18/2018 1409   NA 135 (L) 01/08/2017 1055   K 4.2 01/18/2018 1409   K 3.2 (L) 01/08/2017 1055   CL 103 01/18/2018 1409   CO2 28 01/18/2018 1409   CO2 25 01/08/2017 1055   BUN 15 01/18/2018 1409   BUN 14.6 01/08/2017 1055   CREATININE 1.27 (H) 01/18/2018 1409   CREATININE 1.3 (H) 01/08/2017 1055      Component Value Date/Time   CALCIUM 9.3 01/18/2018 1409   CALCIUM 9.5 01/08/2017 1055   ALKPHOS 113 01/18/2018 1409   ALKPHOS 82 01/08/2017 1055   AST 18 01/18/2018 1409   AST 31 01/08/2017 1055   ALT 13 01/18/2018 1409   ALT 34 01/08/2017 1055   BILITOT 0.5 01/18/2018 1409   BILITOT 1.74 (H) 01/08/2017 1055       RADIOGRAPHIC STUDIES: Ct Chest W Contrast  Result Date: 12/25/2017 CLINICAL DATA:  Patient with history of lung cancer diagnosed in  2018. Patient on chemotherapy. Follow-up exam. EXAM: CT CHEST, ABDOMEN, AND PELVIS WITH CONTRAST TECHNIQUE: Multidetector CT imaging of the chest, abdomen and pelvis was performed following the standard protocol during bolus administration of intravenous contrast. CONTRAST:  185m OMNIPAQUE IOHEXOL 300 MG/ML  SOLN COMPARISON:  CT CAP 09/30/2017 FINDINGS: CT CHEST FINDINGS Cardiovascular: Stable heart size. Small pericardial effusion. Aorta main pulmonary artery normal in caliber. Mediastinum/Nodes: No enlarged axillary, mediastinal or hilar lymphadenopathy. Stable 6 mm prevascular lymph node (image 20; series 2). Visualized thyroid is unremarkable. Lungs/Pleura: Central airways are patent. Interval increase in peribronchovascular nodularity throughout the lungs bilaterally with an upper lobe predominance. Additionally there are multiple new and enlarging pulmonary nodules throughout the lungs bilaterally. Reference nodule within the peripheral aspect of the lingula measures 1.4 x 1.0 cm (image 62; series 7), previously 0.4 x 0.4 cm. Reference new 5 mm left lower lobe nodule (image 113; series 7). There is a new 1.5 x 1.2 cm nodule  superior segment right lower lobe (image 49; series 7). There is a new 0.7 x 0.6 cm nodule right upper lobe (image 40; series 7). No pleural effusion or pneumothorax. Musculoskeletal: Stable sclerotic focus within the posterior T3 vertebral body (image 100; series 6). CT ABDOMEN PELVIS FINDINGS Hepatobiliary: Liver is normal in size and contour. Gallbladder is unremarkable. No intrahepatic or extrahepatic biliary ductal dilatation. Pancreas: Unremarkable Spleen: Unremarkable Adrenals/Urinary Tract: Normal adrenal glands. Kidneys enhance symmetrically with contrast. Right extrarenal pelvis. Normal appearance of the urinary bladder. Stomach/Bowel: No abnormal bowel wall thickening or evidence for bowel obstruction. No free fluid or free intraperitoneal air. Normal morphology of the stomach.  Vascular/Lymphatic: Normal caliber abdominal aorta. Peripheral calcified atherosclerotic plaque. No retroperitoneal lymphadenopathy. Reproductive: Status post hysterectomy. Other: None. Musculoskeletal: Lumbar spine degenerative changes. No aggressive or acute appearing osseous lesions. IMPRESSION: 1. Interval increase in size and number of pulmonary nodularity concerning for disease progression. 2. Similar-appearing T3 sclerotic lesion. Electronically Signed   By: Lovey Newcomer M.D.   On: 12/25/2017 13:24   Mr Jeri Cos ZO Contrast  Result Date: 01/06/2018 CLINICAL DATA:  Lung cancer staging EXAM: MRI HEAD WITHOUT AND WITH CONTRAST TECHNIQUE: Multiplanar, multiecho pulse sequences of the brain and surrounding structures were obtained without and with intravenous contrast. CONTRAST:  10 cc Gadavist intravenous COMPARISON:  11/12/2016 FINDINGS: Brain: Innumerable subcentimeter enhancing lesions seen throughout the bilateral cerebellum, bilateral cortex/juxta cortical white matter, and deep gray nuclei. There is no significant associated edema. No infarct, hemorrhage, or hydrocephalus. Vascular: Major flow voids and vascular enhancements are preserved Skull and upper cervical spine: Negative for marrow lesion Sinuses/Orbits: Negative These results will be called to the ordering clinician or representative by the Radiologist Assistant, and communication documented in the PACS or zVision Dashboard. IMPRESSION: Interval development of innumerable small brain metastases with a miliary appearance. Electronically Signed   By: Monte Fantasia M.D.   On: 01/06/2018 06:49   Ct Abdomen Pelvis W Contrast  Result Date: 12/25/2017 CLINICAL DATA:  Patient with history of lung cancer diagnosed in 2018. Patient on chemotherapy. Follow-up exam. EXAM: CT CHEST, ABDOMEN, AND PELVIS WITH CONTRAST TECHNIQUE: Multidetector CT imaging of the chest, abdomen and pelvis was performed following the standard protocol during bolus  administration of intravenous contrast. CONTRAST:  172m OMNIPAQUE IOHEXOL 300 MG/ML  SOLN COMPARISON:  CT CAP 09/30/2017 FINDINGS: CT CHEST FINDINGS Cardiovascular: Stable heart size. Small pericardial effusion. Aorta main pulmonary artery normal in caliber. Mediastinum/Nodes: No enlarged axillary, mediastinal or hilar lymphadenopathy. Stable 6 mm prevascular lymph node (image 20; series 2). Visualized thyroid is unremarkable. Lungs/Pleura: Central airways are patent. Interval increase in peribronchovascular nodularity throughout the lungs bilaterally with an upper lobe predominance. Additionally there are multiple new and enlarging pulmonary nodules throughout the lungs bilaterally. Reference nodule within the peripheral aspect of the lingula measures 1.4 x 1.0 cm (image 62; series 7), previously 0.4 x 0.4 cm. Reference new 5 mm left lower lobe nodule (image 113; series 7). There is a new 1.5 x 1.2 cm nodule superior segment right lower lobe (image 49; series 7). There is a new 0.7 x 0.6 cm nodule right upper lobe (image 40; series 7). No pleural effusion or pneumothorax. Musculoskeletal: Stable sclerotic focus within the posterior T3 vertebral body (image 100; series 6). CT ABDOMEN PELVIS FINDINGS Hepatobiliary: Liver is normal in size and contour. Gallbladder is unremarkable. No intrahepatic or extrahepatic biliary ductal dilatation. Pancreas: Unremarkable Spleen: Unremarkable Adrenals/Urinary Tract: Normal adrenal glands. Kidneys enhance symmetrically with contrast. Right extrarenal  pelvis. Normal appearance of the urinary bladder. Stomach/Bowel: No abnormal bowel wall thickening or evidence for bowel obstruction. No free fluid or free intraperitoneal air. Normal morphology of the stomach. Vascular/Lymphatic: Normal caliber abdominal aorta. Peripheral calcified atherosclerotic plaque. No retroperitoneal lymphadenopathy. Reproductive: Status post hysterectomy. Other: None. Musculoskeletal: Lumbar spine  degenerative changes. No aggressive or acute appearing osseous lesions. IMPRESSION: 1. Interval increase in size and number of pulmonary nodularity concerning for disease progression. 2. Similar-appearing T3 sclerotic lesion. Electronically Signed   By: Lovey Newcomer M.D.   On: 12/25/2017 13:24    ASSESSMENT AND PLAN: This is a very pleasant 52 years old African-American female with metastatic non-small cell lung cancer, adenocarcinoma and positive for EGFR mutation with deletion in exon 19.  The patient is currently on treatment with GILOTRIF 40 mg p.o. daily status post 13 months.  The patient is tolerating this treatment well except for few episodes of diarrhea and mild skin rash. She is complaining of dizzy spells and vertigo recently. Repeat CT scan of the chest, abdomen and pelvis performed recently showed some evidence for disease progression with increase in size and number of pulmonary nodules. Her treatment was switched to Tagrisso 80 mg p.o. daily started January 01, 2018.  The patient has been tolerating this treatment well with no concerning adverse effects. Recent MRI of the brain showed multiple small metastatic brain lesions.  I personally and independently reviewed the scan images and discussed the result with the patient today.  I recommended for the patient to continue her current treatment with Tagrisso for now as it has the ability to cross the blood-brain barrier and may help with the small metastatic brain lesions.  I will arrange for the patient to have repeat MRI of the brain with her next imaging studies to evaluate these small lesions. I will see the patient back for follow-up visit in 2-3 weeks for evaluation and repeat blood work for close monitoring and managing of any adverse effect of her treatment. She was advised to call immediately if she has any concerning symptoms in the interval. I personally and independently reviewed the scan images and discussed the result and showed  the images to the patient today.  The patient voices understanding of current disease status and treatment options and is in agreement with the current care plan. All questions were answered. The patient knows to call the clinic with any problems, questions or concerns. We can certainly see the patient much sooner if necessary.  Disclaimer: This note was dictated with voice recognition software. Similar sounding words can inadvertently be transcribed and may not be corrected upon review.

## 2018-02-01 ENCOUNTER — Ambulatory Visit (INDEPENDENT_AMBULATORY_CARE_PROVIDER_SITE_OTHER): Payer: 59 | Admitting: Medical

## 2018-02-01 ENCOUNTER — Encounter: Payer: Self-pay | Admitting: Medical

## 2018-02-01 VITALS — BP 125/75 | HR 71 | Temp 98.4°F | Resp 16 | Ht 66.0 in | Wt 239.0 lb

## 2018-02-01 DIAGNOSIS — J40 Bronchitis, not specified as acute or chronic: Secondary | ICD-10-CM | POA: Diagnosis not present

## 2018-02-01 DIAGNOSIS — J0100 Acute maxillary sinusitis, unspecified: Secondary | ICD-10-CM

## 2018-02-01 MED ORDER — BENZONATATE 100 MG PO CAPS: 100.0000 mg | ORAL_CAPSULE | Freq: Three times a day (TID) | ORAL | 0 refills | Status: DC | PRN

## 2018-02-01 MED ORDER — FLUTICASONE PROPIONATE 50 MCG/ACT NA SUSP: 2.0000 | Freq: Every day | NASAL | 1 refills | Status: DC

## 2018-02-01 MED ORDER — DOXYCYCLINE HYCLATE 100 MG PO TABS: 100.0000 mg | ORAL_TABLET | Freq: Two times a day (BID) | ORAL | 0 refills | Status: DC

## 2018-02-01 MED FILL — BENZONATATE 100 MG CAP: 100 | 10 days supply | Qty: 30 | Fill #0

## 2018-02-01 MED FILL — FLUTICASONE PROP 50 MCG SPR: 50 | 30 days supply | Qty: 16 | Fill #0

## 2018-02-01 MED FILL — DOXYCYCLINE HYCLATE 100 MG: 100 | 10 days supply | Qty: 20 | Fill #0

## 2018-02-01 NOTE — Progress Notes (Signed)
Subjective:    Patient ID: Nancy Moore, female    DOB: 15-Jul-1965, 53 y.o.   MRN: [id number]  HPI  Pt in with cough, congestion, st and runny nose since past Wednesday.  No fever, or chills. But then states some sweats. But then she did turn her heat up briefly until 63 when house was cold. Pt does have history of lung cancer.  She has been coughing up some mucus since Friday.    Review of Systems  Constitutional: Negative for chills, fatigue and fever.  HENT: Positive for congestion, sinus pain and sore throat.   Respiratory: Positive for cough. Negative for chest tightness, shortness of breath and wheezing.   Cardiovascular: Negative for chest pain and palpitations.  Gastrointestinal: Negative for abdominal pain.  Musculoskeletal: Negative for back pain and myalgias.  Neurological: Negative for dizziness, seizures and headaches.  Hematological: Negative for adenopathy. Does not bruise/bleed easily.  Psychiatric/Behavioral: Negative for behavioral problems and confusion.    Past Medical History:  Diagnosis Date  . Adenocarcinoma of left lung, stage 4 ([place]) 10/28/2016  . Goals of care, counseling/discussion 10/28/2016  . Hypertension      Social History   Socioeconomic History  . Marital status: Married    Spouse name: Not on file  . Number of children: Not on file  . Years of education: Not on file  . Highest education level: Not on file  Occupational History  . Not on file  Social Needs  . Financial resource strain: Not on file  . Food insecurity:    Worry: Not on file    Inability: Not on file  . Transportation needs:    Medical: Not on file    Non-medical: Not on file  Tobacco Use  . Smoking status: Never Smoker  . Smokeless tobacco: Never Used  Substance and Sexual Activity  . Alcohol use: Yes    Comment: socially  . Drug use: No  . Sexual activity: Never  Lifestyle  . Physical activity:    Days per week: Not on file    Minutes per session: Not on  file  . Stress: Not on file  Relationships  . Social connections:    Talks on phone: Not on file    Gets together: Not on file    Attends religious service: Not on file    Active member of club or organization: Not on file    Attends meetings of clubs or organizations: Not on file    Relationship status: Not on file  . Intimate partner violence:    Fear of current or ex partner: Not on file    Emotionally abused: Not on file    Physically abused: Not on file    Forced sexual activity: Not on file  Other Topics Concern  . Not on file  Social History Narrative  . Not on file    Past Surgical History:  Procedure Laterality Date  . BREAST EXCISIONAL BIOPSY Left 20+ yrs ago   benign  . PERICARDIAL WINDOW N/A 01/10/2017   Procedure: PERICARDIAL WINDOW- SUB XYPHOID, RIGHT CHEST TUBE;  Surgeon: Ivin Poot, MD;  Location: [facility];  Service: Thoracic;  Laterality: N/A;  . VIDEO BRONCHOSCOPY Bilateral 10/21/2016   Procedure: VIDEO BRONCHOSCOPY WITH FLUORO;  Surgeon: Tanda Rockers, MD;  Location: [facility];  Service: Cardiopulmonary;  Laterality: Bilateral;    Family History  Problem Relation Age of Onset  . Asthma Mother   . Stroke Mother   . Hypertension  Mother   . Heart attack Father   . Hypertension Father   . Hyperlipidemia Father   . Dementia Father   . Emphysema Maternal Grandmother   . Hypertension Maternal Grandmother   . Colon cancer Paternal Grandmother     Allergies  Allergen Reactions  . Percocet [Oxycodone-Acetaminophen] Other (See Comments)    "makes me dizzy"  . Lisinopril Cough  . Losartan     Current Outpatient Medications on File Prior to Visit  Medication Sig Dispense Refill  . ammonium lactate (LAC-HYDRIN) 12 % lotion Apply topically as needed for dry skin. 400 g 0  . clindamycin (CLINDAGEL) 1 % gel APPLY TOPICALLY 2 TIMES DAILY. 30 g 1  . loperamide (IMODIUM) 1 MG/5ML solution Take 2 mg as needed by mouth for diarrhea or loose stools.    .  magnesium oxide (MAG-OX) 400 (241.3 Mg) MG tablet Take 1 tablet (400 mg total) by mouth daily.    . methocarbamol (ROBAXIN) 500 MG tablet TAKE 1 TABLET (500 MG TOTAL) BY MOUTH EVERY 8 (EIGHT) HOURS AS NEEDED FOR MUSCLE SPASMS. 30 tablet 5  . osimertinib mesylate (TAGRISSO) 80 MG tablet Take 1 tablet (80 mg total) by mouth daily. 30 tablet 2  . Oxycodone HCl 10 MG TABS Take 1 tablet (10 mg total) by mouth 3 (three) times daily as needed. 45 tablet 0  . pantoprazole (PROTONIX) 40 MG tablet Take 1 tablet (40 mg total) by mouth daily at 12 noon. 90 tablet 3  . potassium chloride SA (K-DUR,KLOR-CON) 20 MEQ tablet Take 1 tablet (20 mEq total) by mouth 2 (two) times daily. 180 tablet 3  . prochlorperazine (COMPAZINE) 10 MG tablet Take 1 tablet (10 mg total) every 6 (six) hours as needed by mouth for nausea or vomiting. 30 tablet 1  . senna-docusate (SENOKOT-S) 8.6-50 MG tablet Take 1 tablet by mouth at bedtime.    . furosemide (LASIX) 40 MG tablet Take 1 tablet (40 mg total) by mouth daily. 90 tablet 3   No current facility-administered medications on file prior to visit.     BP (!) 130/46   Pulse 71   Temp 98.4 F (36.9 C) (Oral)   Resp 16   Ht 5\' 6"  (1.676 m)   Wt 239 lb (108.4 kg)   LMP 03/15/2010   SpO2 100%   BMI 38.58 kg/m       Objective:   Physical Exam  General  Mental Status - Alert. General Appearance - Well groomed. Not in acute distress.  Skin Rashes- No Rashes.  HEENT Head- Normal. Ear Auditory Canal - Left- Normal. Right - Normal.Tympanic Membrane- Left- Normal. Right- Normal. Eye Sclera/Conjunctiva- Left- Normal. Right- Normal. Nose & Sinuses Nasal Mucosa- Left-  Boggy and Congested. Right-  Boggy and  Congested.Bilateral maxillary and frontal sinus pressure. Mouth & Throat Lips: Upper Lip- Normal: no dryness, cracking, pallor, cyanosis, or vesicular eruption. Lower Lip-Normal: no dryness, cracking, pallor, cyanosis or vesicular eruption. Buccal Mucosa-  Bilateral- No Aphthous ulcers. Oropharynx- No Discharge or Erythema. Tonsils: Characteristics- Bilateral- No Erythema or Congestion. Size/Enlargement- Bilateral- No enlargement. Discharge- bilateral-None.  Neck Neck- Supple. No Masses.   Chest and Lung Exam Auscultation: Breath Sounds:-Clear even and unlabored.  Cardiovascular Auscultation:Rythm- Regular, rate and rhythm. Murmurs & Other Heart Sounds:Ausculatation of the heart reveal- No Murmurs.  Lymphatic Head & Neck General Head & Neck Lymphatics: Bilateral: Description- No Localized lymphadenopathy.       Assessment & Plan:   You appear to have bronchitis and sinusitis. Rest  hydrate and tylenol for fever. I am prescribing cough medicine benzonatate, and doxycycline antibiotic. For your nasal congestion flonase.  You should gradually get better. If not then notify us and would recommend a chest xray.  Follow up in 7-10 days or as needed.  Appointment with oncologist on Feb 05, 2017. So held off presently getting cxr. Not indicated today but would recommend if worsens.  Mackie Pai, PA-C

## 2018-02-01 NOTE — Patient Instructions (Addendum)
You appear to have bronchitis and sinusitis. Rest hydrate and tylenol for fever. I am prescribing cough medicine benzonatate, and doxycycline antibiotic. For your nasal congestion flonase.  You should gradually get better. If not then notify us and would recommend a chest xray.  Follow up in 7-10 days or as needed.  Appointment with oncologist on Feb 05, 2017.

## 2018-02-05 ENCOUNTER — Telehealth: Payer: Self-pay | Admitting: Internal Medicine

## 2018-02-05 ENCOUNTER — Inpatient Hospital Stay: Payer: 59

## 2018-02-05 ENCOUNTER — Inpatient Hospital Stay: Payer: 59 | Attending: Oncology | Admitting: Internal Medicine

## 2018-02-05 ENCOUNTER — Encounter: Payer: Self-pay | Admitting: Internal Medicine

## 2018-02-05 VITALS — BP 136/77 | HR 68 | Temp 98.5°F | Resp 17 | Ht 66.0 in | Wt 240.6 lb

## 2018-02-05 DIAGNOSIS — C7931 Secondary malignant neoplasm of brain: Secondary | ICD-10-CM | POA: Diagnosis not present

## 2018-02-05 DIAGNOSIS — R21 Rash and other nonspecific skin eruption: Secondary | ICD-10-CM | POA: Diagnosis not present

## 2018-02-05 DIAGNOSIS — R197 Diarrhea, unspecified: Secondary | ICD-10-CM | POA: Diagnosis not present

## 2018-02-05 DIAGNOSIS — C3492 Malignant neoplasm of unspecified part of left bronchus or lung: Secondary | ICD-10-CM | POA: Insufficient documentation

## 2018-02-05 DIAGNOSIS — I10 Essential (primary) hypertension: Secondary | ICD-10-CM | POA: Diagnosis not present

## 2018-02-05 DIAGNOSIS — Z5111 Encounter for antineoplastic chemotherapy: Secondary | ICD-10-CM

## 2018-02-05 DIAGNOSIS — C3490 Malignant neoplasm of unspecified part of unspecified bronchus or lung: Secondary | ICD-10-CM | POA: Insufficient documentation

## 2018-02-05 LAB — CBC WITH DIFFERENTIAL (CANCER CENTER ONLY)
Abs Immature Granulocytes: 0.02 10*3/uL (ref 0.00–0.07)
Basophils Absolute: 0 10*3/uL (ref 0.0–0.1)
Basophils Relative: 1 %
Eosinophils Absolute: 0.3 10*3/uL (ref 0.0–0.5)
Eosinophils Relative: 5 %
HCT: 42.5 % (ref 36.0–46.0)
Hemoglobin: 13 g/dL (ref 12.0–15.0)
Immature Granulocytes: 0 %
Lymphocytes Relative: 26 %
Lymphs Abs: 1.6 10*3/uL (ref 0.7–4.0)
MCH: 28.4 pg (ref 26.0–34.0)
MCHC: 30.6 g/dL (ref 30.0–36.0)
MCV: 92.8 fL (ref 80.0–100.0)
Monocytes Absolute: 0.5 10*3/uL (ref 0.1–1.0)
Monocytes Relative: 9 %
Neutro Abs: 3.7 10*3/uL (ref 1.7–7.7)
Neutrophils Relative %: 59 %
Platelet Count: 248 10*3/uL (ref 150–400)
RBC: 4.58 MIL/uL (ref 3.87–5.11)
RDW: 13.6 % (ref 11.5–15.5)
WBC Count: 6.2 10*3/uL (ref 4.0–10.5)
nRBC: 0 % (ref 0.0–0.2)

## 2018-02-05 LAB — CMP (CANCER CENTER ONLY)
ALT: 13 U/L (ref 0–44)
AST: 17 U/L (ref 15–41)
Albumin: 3.7 g/dL (ref 3.5–5.0)
Alkaline Phosphatase: 116 U/L (ref 38–126)
Anion gap: 7 (ref 5–15)
BUN: 15 mg/dL (ref 6–20)
CO2: 30 mmol/L (ref 22–32)
Calcium: 9.2 mg/dL (ref 8.9–10.3)
Chloride: 104 mmol/L (ref 98–111)
Creatinine: 1.11 mg/dL — ABNORMAL HIGH (ref 0.44–1.00)
GFR, Est AFR Am: >60 mL/min (ref >60)
GFR, Estimated: 57 mL/min — ABNORMAL LOW (ref >60)
Glucose, Bld: 77 mg/dL (ref 70–99)
Potassium: 4.1 mmol/L (ref 3.5–5.1)
Sodium: 141 mmol/L (ref 135–145)
Total Bilirubin: 0.4 mg/dL (ref 0.3–1.2)
Total Protein: 7.4 g/dL (ref 6.5–8.1)

## 2018-02-05 NOTE — Progress Notes (Signed)
[facility] Telephone:([phone]) [phone]   Fax:(336) [phone]  OFFICE PROGRESS NOTE  Copland, Gay Filler, MD [address] [place] [address]  DIAGNOSIS: DIAGNOSIS: stage IV (T3, N2, M1 a) non-small cell lung cancer, poorly differentiated adenocarcinoma with extensive miliary distribution in the lungs bilaterally diagnosed in September 2018. 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 NEGATIVE for the Exon 20 [id number] mutation. Repeat molecular studies by guardant 360 at time of progression showed ATM [id number]  PRIOR THERAPY: Gilotrif 40 mg daily started 11/08/2016.Status post 13 months of treatment.  CURRENT THERAPY:  Tagrisso 80 mg p.o. daily.  First dose started January 01, 2018  INTERVAL HISTORY: Nancy Moore 53y.o. female returns to the clinic today for follow-up visit.  The patient is feeling fine today with no concerning complaints.  She denied having any chest pain, shortness of breath, cough or hemoptysis.  She denied having any nausea, vomiting, diarrhea or constipation.  She denied having any headache or neurological symptoms.  She has no recent weight loss or night sweats.  She has been tolerating her treatment with Tagrisso fairly well.  The patient is here today for evaluation and repeat blood work.  MEDICAL HISTORY: Past Medical History:  Diagnosis Date  . [place], stage 4 [place] 10/28/2016  . Goals of care, counseling/discussion 10/28/2016  . Hypertension     ALLERGIES:  is allergic to percocet [oxycodone-acetaminophen]; lisinopril; and losartan.  MEDICATIONS:  Current Outpatient Medications  Medication Sig Dispense Refill  . ammonium lactate (LAC-HYDRIN) 12 % lotion Apply topically as needed for dry skin. 400 g 0  . benzonatate (TESSALON) 100 MG capsule Take 1 capsule (100 mg total) by mouth 3 (three) times daily as needed for cough. 30 capsule 0  . clindamycin (CLINDAGEL) 1 % gel APPLY TOPICALLY 2 TIMES DAILY. 30 g  1  . doxycycline (VIBRA-TABS) 100 MG tablet Take 1 tablet (100 mg total) by mouth 2 (two) times daily. Can give caps or generic 20 tablet 0  . fluticasone (FLONASE) 50 MCG/ACT nasal spray Place 2 sprays into both nostrils daily. 16 g 1  . furosemide (LASIX) 40 MG tablet Take 1 tablet (40 mg total) by mouth daily. 90 tablet 3  . loperamide (IMODIUM) 1 MG/5ML solution Take 2 mg as needed by mouth for diarrhea or loose stools.    . magnesium oxide (MAG-OX) 400 (241.3 Mg) MG tablet Take 1 tablet (400 mg total) by mouth daily.    . methocarbamol (ROBAXIN) 500 MG tablet TAKE 1 TABLET (500 MG TOTAL) BY MOUTH EVERY 8 (EIGHT) HOURS AS NEEDED FOR MUSCLE SPASMS. 30 tablet 5  . osimertinib mesylate (TAGRISSO) 80 MG tablet Take 1 tablet (80 mg total) by mouth daily. 30 tablet 2  . Oxycodone HCl 10 MG TABS Take 1 tablet (10 mg total) by mouth 3 (three) times daily as needed. 45 tablet 0  . pantoprazole (PROTONIX) 40 MG tablet Take 1 tablet (40 mg total) by mouth daily at 12 noon. 90 tablet 3  . potassium chloride SA (K-DUR,KLOR-CON) 20 MEQ tablet Take 1 tablet (20 mEq total) by mouth 2 (two) times daily. 180 tablet 3  . prochlorperazine (COMPAZINE) 10 MG tablet Take 1 tablet (10 mg total) every 6 (six) hours as needed by mouth for nausea or vomiting. 30 tablet 1  . senna-docusate (SENOKOT-S) 8.6-50 MG tablet Take 1 tablet by mouth at bedtime.     No current facility-administered medications for this  visit.     SURGICAL HISTORY:  Past Surgical History:  Procedure Laterality Date  . BREAST EXCISIONAL BIOPSY Left 20+ yrs ago   benign  . PERICARDIAL WINDOW N/A 01/10/2017   Procedure: PERICARDIAL WINDOW- SUB XYPHOID, RIGHT CHEST TUBE;  Surgeon: Ivin Poot, MD;  Location: [facility];  Service: Thoracic;  Laterality: N/A;  . VIDEO BRONCHOSCOPY Bilateral 10/21/2016   Procedure: VIDEO BRONCHOSCOPY WITH FLUORO;  Surgeon: Tanda Rockers, MD;  [facility];  Service: Cardiopulmonary;  Laterality:  Bilateral;    REVIEW OF SYSTEMS:  A comprehensive review of systems was negative.   PHYSICAL EXAMINATION: General appearance: alert, cooperative, fatigued and no distress Head: Normocephalic, without obvious abnormality, atraumatic Neck: no adenopathy, no JVD, supple, symmetrical, trachea midline and thyroid not enlarged, symmetric, no tenderness/mass/nodules Lymph nodes: Cervical, supraclavicular, and axillary nodes normal. Resp: clear to auscultation bilaterally Back: symmetric, no curvature. ROM normal. No CVA tenderness. Cardio: regular rate and rhythm, S1, S2 normal, no murmur, click, rub or gallop GI: soft, non-tender; bowel sounds normal; no masses,  no organomegaly Extremities: extremities normal, atraumatic, no cyanosis or edema  ECOG PERFORMANCE STATUS: 0 - Asymptomatic  Blood pressure 136/77, pulse 68, temperature 98.5 F (36.9 C), temperature source Oral, resp. rate 17, height '5\' 6"'  (1.676 m), weight 240 lb 9.6 oz (109.1 kg), last menstrual period 03/15/2010, SpO2 100 %.  LABORATORY DATA: Lab Results  Component Value Date   WBC 6.2 02/05/2018   HGB 13.0 02/05/2018   HCT 42.5 02/05/2018   MCV 92.8 02/05/2018   PLT 248 02/05/2018      Chemistry      Component Value Date/Time   NA 141 01/18/2018 1409   NA 135 (L) 01/08/2017 1055   K 4.2 01/18/2018 1409   K 3.2 (L) 01/08/2017 1055   CL 103 01/18/2018 1409   CO2 28 01/18/2018 1409   CO2 25 01/08/2017 1055   BUN 15 01/18/2018 1409   BUN 14.6 01/08/2017 1055   CREATININE 1.27 (H) 01/18/2018 1409   CREATININE 1.3 (H) 01/08/2017 1055      Component Value Date/Time   CALCIUM 9.3 01/18/2018 1409   CALCIUM 9.5 01/08/2017 1055   ALKPHOS 113 01/18/2018 1409   ALKPHOS 82 01/08/2017 1055   AST 18 01/18/2018 1409   AST 31 01/08/2017 1055   ALT 13 01/18/2018 1409   ALT 34 01/08/2017 1055   BILITOT 0.5 01/18/2018 1409   BILITOT 1.74 (H) 01/08/2017 1055       RADIOGRAPHIC STUDIES: No results found.  ASSESSMENT  AND PLAN: This is a very pleasant 53 years old African-American female with metastatic non-small cell lung cancer, adenocarcinoma and positive for EGFR mutation with deletion in exon 19.  The patient is currently on treatment with GILOTRIF 40 mg p.o. daily status post 13 months.  The patient is tolerating this treatment well except for few episodes of diarrhea and mild skin rash. She is complaining of dizzy spells and vertigo recently. Repeat CT scan of the chest, abdomen and pelvis performed recently showed some evidence for disease progression with increase in size and number of pulmonary nodules. Her treatment was switched to Tagrisso 80 mg p.o. daily started January 01, 2018.  She is status post 1 months of treatment and has been tolerating her treatment well. I recommended for the patient to continue her current treatment with Tagrisso with the same dose. I will see her back for follow-up visit in 1 months after repeating MRI of the brain as well  as CT scan of the chest, abdomen and pelvis for restaging of her disease and evaluate the previous brain metastasis. The patient was advised to call immediately if she has any concerning symptoms in the interval. I personally and independently reviewed the scan images and discussed the result and showed the images to the patient today.  The patient voices understanding of current disease status and treatment options and is in agreement with the current care plan. All questions were answered. The patient knows to call the clinic with any problems, questions or concerns. We can certainly see the patient much sooner if necessary.  Disclaimer: This note was dictated with voice recognition software. Similar sounding words can inadvertently be transcribed and may not be corrected upon review.

## 2018-02-05 NOTE — Telephone Encounter (Signed)
Printed calendar and avs. °

## 2018-02-10 ENCOUNTER — Telehealth: Payer: Self-pay | Admitting: *Deleted

## 2018-02-10 NOTE — Telephone Encounter (Signed)
Oncology Nurse Navigator Documentation  Oncology Nurse Navigator Flowsheets 02/10/2018  Navigator Location [facility]  Navigator Encounter Type Telephone/I received a call from Nancy Moore.  She updated me that she feels her place of employment is giving her a hard time about not working.  I listened as she explained. I updated her that if the cancer center can help with any letters or calls to update [place].  She states she will and is looking into this issue.   Telephone Incoming Call;Outgoing Call  Treatment Phase Treatment  Barriers/Navigation Needs Education  Education Other  Interventions Education  Acuity Level 1  Time Spent with Patient 15

## 2018-02-11 ENCOUNTER — Telehealth: Payer: Self-pay | Admitting: *Deleted

## 2018-02-11 ENCOUNTER — Telehealth: Payer: Self-pay | Admitting: Pharmacist

## 2018-02-11 NOTE — Telephone Encounter (Signed)
Oral Oncology Pharmacist Encounter  Received call from patient stating that she has been informed that she will be terminated from her job as of 03/01/2018. Her current prescription insurance will be terminated as well. She anticipates that she will be insured through the month of February.  Patient currently filling Tagrisso at [employer] per insurance requirement.  We discussed possible avenues for continued Tagrisso acquisition including enrolling for manufacturer assistance compassionate use program through [facility] prescription savings program. Patient informed that this will require completed application, patient signature, and proof of income documentation to be sent to [place].  Patient instructed to order her next fill of Tagrisso from current dispensing pharmacy as soon as she is able in an attempt to receive 2 more fills of Tagrisso prior to termination of her prescription coverage.  We discussed marketplace insurance options as well as the cost associated with [employer].  Oral oncology clinic will work with patient to complete application for [place] in an effort to provide Tagrisso to patient at $0 out-of-pocket cost to her while she is uninsured.  All questions answered. Patient understands and is in agreement with above plan. She knows to call the office with any additional questions or concerns.  Application for [place] prescription savings program, if completed, will be updated in a separate encounter.  Johny Drilling, PharmD, BCPS, BCOP  02/11/2018 10:12 AM Oral Oncology Clinic [phone]

## 2018-02-11 NOTE — Telephone Encounter (Signed)
Oncology Nurse Navigator Documentation  Oncology Nurse Navigator Flowsheets 02/11/2018  Navigator Location [facility]  Navigator Encounter Type Telephone/Ms. Baxley called me today.  She asked that I call her work to see if I could get an extension of her leave.  I spoke with Ms. Olien who Ms. Gerety requested I speak with.  She states there is no information needed from [place]. Ms. Fiddler leave is up but if she wants to come back part time she could.  I called and updated Ms. Mollica.  She was thankful for the help.   Telephone Incoming Call;Outgoing Call  Treatment Phase Treatment  Barriers/Navigation Needs Education;Coordination of Care  Education Other  Interventions Coordination of Care;Education  Coordination of Care Other  Acuity Level 3  Time Spent with Patient 45

## 2018-02-17 ENCOUNTER — Telehealth: Payer: Self-pay

## 2018-02-17 MED FILL — POTASSIUM CHLORIDE CRYS ER: 20 | 30 days supply | Qty: 60 | Fill #2

## 2018-02-17 MED FILL — PANTOPRAZOLE SOD DR 40 MG T: 40 | 30 days supply | Qty: 30 | Fill #2

## 2018-02-17 MED FILL — FUROSEMIDE 40 MG TAB: 40 | 30 days supply | Qty: 30 | Fill #2

## 2018-02-17 MED FILL — METHOCARBAMOL 500 MG TABLET: 500 | 10 days supply | Qty: 30 | Fill #1

## 2018-02-17 NOTE — Telephone Encounter (Signed)
Oral Oncology Patient Advocate Encounter  I received notification from Oral Oncology Pharmacist that the patients insurance will terminate in February and she will need assistance with getting her Tagrisso.   I called the patient and she stated that her insurance will terminate on 03/27/18. Nancy Moore is being shipped to her on 1/28 and she will get one more fill in February before her insurance terminates. I explained the application and process of manufacturer assistance with the patient in detail and she agreed to proceed. The patient will bring in her income documents and sign the application at her next visit on 03/08/18.   Nancy Moore verbalized understanding and great appreciation.  [facility] [place] Phone [phone] Fax [phone]

## 2018-02-23 ENCOUNTER — Telehealth: Payer: Self-pay

## 2018-02-23 ENCOUNTER — Encounter: Payer: Self-pay | Admitting: Family Medicine

## 2018-02-23 DIAGNOSIS — C3492 Malignant neoplasm of unspecified part of left bronchus or lung: Secondary | ICD-10-CM

## 2018-02-23 DIAGNOSIS — M542 Cervicalgia: Secondary | ICD-10-CM

## 2018-02-23 NOTE — Telephone Encounter (Signed)
Pt called and stated that she also needs the referral for sports medicine. She was seen July 31st and needs this visit back dated also.   Pt states that she seen Dr Inda Merlin at least 3 times per month for oncology. From July 2019 -March 2020

## 2018-02-23 NOTE — Telephone Encounter (Signed)
Called her, sent a mychart.  I am not sure what her insurance company is asking for.  She has seen Dr. Julien Nordmann since 2018

## 2018-02-23 NOTE — Telephone Encounter (Signed)
Copied from [place] [id number]. Topic: Referral - Question >> Feb 23, 2018  1:16 PM Nancy Moore wrote: Reason for CRM: Patient states Dr Lorelei Pont gave her a referral for Oncology with Dr Inda Merlin back in January of 2019 and it was only good to June 30th, 2019. She said that it should have been for one year. She continued seeing him and has been seeing about twice a month because she is not better. She needs the referral to be back dated and faxed over to his office so she can have them re submit it to insurance. Please Advise.

## 2018-02-26 NOTE — Telephone Encounter (Addendum)
Oral Oncology Patient Advocate Encounter  Patient did come in to sign the application and I made a copy of her income documents.  I will fax the completed application mid February, when insurance is getting closer to termination.   I also asked the patient if she had considered applying for medicaid and she stated she has a niece that was helping her look into that and she would let me know if she was able to get it.  [facility] [place] Phone [phone] Fax [phone]

## 2018-03-02 ENCOUNTER — Ambulatory Visit (HOSPITAL_COMMUNITY)
Admission: RE | Admit: 2018-03-02 | Discharge: 2018-03-02 | Disposition: A | Discharge status: Home / Self Care | Payer: 59 | Source: Ambulatory Visit | Attending: Internal Medicine | Admitting: Internal Medicine

## 2018-03-02 DIAGNOSIS — C3490 Malignant neoplasm of unspecified part of unspecified bronchus or lung: Secondary | ICD-10-CM | POA: Diagnosis not present

## 2018-03-02 MED ORDER — GADOBUTROL 1 MMOL/ML IV SOLN
10.0000 mL | Freq: Once | INTRAVENOUS | Status: AC | PRN
  Administered 2018-03-02: 10 mL via INTRAVENOUS

## 2018-03-03 ENCOUNTER — Telehealth: Payer: Self-pay | Admitting: Medical Oncology

## 2018-03-03 NOTE — Telephone Encounter (Signed)
Faxed disability papers to [facility] and copy for pt left at sign in desk.

## 2018-03-05 ENCOUNTER — Telehealth: Payer: Self-pay

## 2018-03-05 ENCOUNTER — Telehealth: Payer: Self-pay | Admitting: Family Medicine

## 2018-03-05 ENCOUNTER — Encounter: Payer: Self-pay | Admitting: Family Medicine

## 2018-03-05 NOTE — Telephone Encounter (Signed)
I called [employer] as asked, was not able to be connected to [place].  I was not able to reach anyone who was able to help.  They did not seem to understand the issue with her coverage.  I will contact pt and let her know this

## 2018-03-05 NOTE — Telephone Encounter (Signed)
Copied from [place] [id number]. Topic: General - Other >> Mar 05, 2018  2:07 PM Yvette Rack wrote: Reason for CRM: Lenoria Chime with [facility] called in stating pt needs a referral for July 27, 2017 in regards to a visit with Nurse Practitioner Mikey Bussing. Tanika request that provider contact them at [phone] regarding the referral.

## 2018-03-05 NOTE — Telephone Encounter (Signed)
Hi Gwen, I wrote a letter for her The member ID- does this refer to the [facility] plan that she had?  I think the member ID is [id number] Is this right as far as you know?

## 2018-03-05 NOTE — Telephone Encounter (Signed)
-----   Message from [facility] sent at 03/04/2018  8:45 AM EST ----- I contacted the [facility] to find out what could be done regarding resubmitting the claims. The info I was provided is that you would have to send in a correspondence for reconsideration to resubmit the claims. Medical records regarding those visits would need to be included. The following info has to be on the cover sheet. Please include reference number [id number]. Fax to [phone].Pt know longer has this insurance so the claims is only for Sports medicine and Oncology. Provider Name Provider Tax ID Member ID Fax number of the provider ----- Message ----- From: Darreld Mclean, MD Sent: 02/25/2018   3:24 PM EST To: Trenda Moots  Hi Gwen,I sent into referrals for this patient, 1 to oncology in 1 to sports medicine.  She is a long-term oncology patient for stage IV lung cancer, she sees Dr. Earlie Server.  It seems that she changed to a new health insurance plan with [place] last year, and is now required to have referrals to her specialists.  We did not realize this, and they are denying her visits since July 1 of 2019.  Can you please help with this?  We may need to try and retroactively give her these referrals JC

## 2018-03-08 ENCOUNTER — Ambulatory Visit (HOSPITAL_COMMUNITY)
Admission: RE | Admit: 2018-03-08 | Discharge: 2018-03-08 | Disposition: A | Discharge status: Home / Self Care | Payer: 59 | Source: Ambulatory Visit | Attending: Internal Medicine | Admitting: Internal Medicine

## 2018-03-08 ENCOUNTER — Inpatient Hospital Stay: Payer: 59 | Attending: Oncology

## 2018-03-08 ENCOUNTER — Other Ambulatory Visit (HOSPITAL_COMMUNITY): Payer: 59

## 2018-03-08 DIAGNOSIS — C3490 Malignant neoplasm of unspecified part of unspecified bronchus or lung: Secondary | ICD-10-CM | POA: Diagnosis present

## 2018-03-08 DIAGNOSIS — C3492 Malignant neoplasm of unspecified part of left bronchus or lung: Secondary | ICD-10-CM | POA: Diagnosis present

## 2018-03-08 DIAGNOSIS — R197 Diarrhea, unspecified: Secondary | ICD-10-CM | POA: Diagnosis not present

## 2018-03-08 DIAGNOSIS — R42 Dizziness and giddiness: Secondary | ICD-10-CM | POA: Insufficient documentation

## 2018-03-08 DIAGNOSIS — C7931 Secondary malignant neoplasm of brain: Secondary | ICD-10-CM | POA: Insufficient documentation

## 2018-03-08 DIAGNOSIS — R21 Rash and other nonspecific skin eruption: Secondary | ICD-10-CM | POA: Diagnosis not present

## 2018-03-08 LAB — CMP (CANCER CENTER ONLY)
ALT: 10 U/L (ref 0–44)
AST: 16 U/L (ref 15–41)
Albumin: 3.7 g/dL (ref 3.5–5.0)
Alkaline Phosphatase: 96 U/L (ref 38–126)
Anion gap: 8 (ref 5–15)
BUN: 13 mg/dL (ref 6–20)
CO2: 29 mmol/L (ref 22–32)
Calcium: 9.4 mg/dL (ref 8.9–10.3)
Chloride: 105 mmol/L (ref 98–111)
Creatinine: 1.23 mg/dL — ABNORMAL HIGH (ref 0.44–1.00)
GFR, Est AFR Am: 59 mL/min — ABNORMAL LOW (ref >60)
GFR, Estimated: 51 mL/min — ABNORMAL LOW (ref >60)
Glucose, Bld: 90 mg/dL (ref 70–99)
Potassium: 4.6 mmol/L (ref 3.5–5.1)
Sodium: 142 mmol/L (ref 135–145)
Total Bilirubin: 0.4 mg/dL (ref 0.3–1.2)
Total Protein: 7.1 g/dL (ref 6.5–8.1)

## 2018-03-08 LAB — CBC WITH DIFFERENTIAL (CANCER CENTER ONLY)
Abs Immature Granulocytes: 0.01 10*3/uL (ref 0.00–0.07)
Basophils Absolute: 0 10*3/uL (ref 0.0–0.1)
Basophils Relative: 0 %
Eosinophils Absolute: 0.2 10*3/uL (ref 0.0–0.5)
Eosinophils Relative: 2 %
HCT: 42.4 % (ref 36.0–46.0)
Hemoglobin: 13 g/dL (ref 12.0–15.0)
Immature Granulocytes: 0 %
Lymphocytes Relative: 26 %
Lymphs Abs: 2 10*3/uL (ref 0.7–4.0)
MCH: 28.4 pg (ref 26.0–34.0)
MCHC: 30.7 g/dL (ref 30.0–36.0)
MCV: 92.8 fL (ref 80.0–100.0)
Monocytes Absolute: 0.6 10*3/uL (ref 0.1–1.0)
Monocytes Relative: 8 %
Neutro Abs: 4.7 10*3/uL (ref 1.7–7.7)
Neutrophils Relative %: 64 %
Platelet Count: 219 10*3/uL (ref 150–400)
RBC: 4.57 MIL/uL (ref 3.87–5.11)
RDW: 14.2 % (ref 11.5–15.5)
WBC Count: 7.5 10*3/uL (ref 4.0–10.5)
nRBC: 0 % (ref 0.0–0.2)

## 2018-03-08 MED ORDER — SODIUM CHLORIDE (PF) 0.9 % IJ SOLN
INTRAMUSCULAR | Status: AC
  Filled 2018-03-08: qty 50

## 2018-03-08 MED ORDER — IOHEXOL 300 MG/ML  SOLN
100.0000 mL | Freq: Once | INTRAMUSCULAR | Status: AC | PRN
  Administered 2018-03-08: 100 mL via INTRAVENOUS

## 2018-03-12 ENCOUNTER — Telehealth: Payer: Self-pay | Admitting: *Deleted

## 2018-03-12 NOTE — Telephone Encounter (Signed)
Received Home Health Discharge-Transfer Summary for review from [facility]; forwarded to provider/SLS 02/14

## 2018-03-15 ENCOUNTER — Inpatient Hospital Stay: Payer: 59 | Admitting: Internal Medicine

## 2018-03-15 ENCOUNTER — Encounter: Payer: Self-pay | Admitting: Internal Medicine

## 2018-03-15 VITALS — BP 142/74 | HR 70 | Temp 98.1°F | Resp 17 | Ht 66.0 in | Wt 242.1 lb

## 2018-03-15 DIAGNOSIS — Z5111 Encounter for antineoplastic chemotherapy: Secondary | ICD-10-CM

## 2018-03-15 DIAGNOSIS — I10 Essential (primary) hypertension: Secondary | ICD-10-CM

## 2018-03-15 DIAGNOSIS — C3490 Malignant neoplasm of unspecified part of unspecified bronchus or lung: Secondary | ICD-10-CM

## 2018-03-15 DIAGNOSIS — R197 Diarrhea, unspecified: Secondary | ICD-10-CM

## 2018-03-15 DIAGNOSIS — C3492 Malignant neoplasm of unspecified part of left bronchus or lung: Secondary | ICD-10-CM

## 2018-03-15 DIAGNOSIS — R21 Rash and other nonspecific skin eruption: Secondary | ICD-10-CM

## 2018-03-15 DIAGNOSIS — C7931 Secondary malignant neoplasm of brain: Secondary | ICD-10-CM

## 2018-03-15 DIAGNOSIS — R42 Dizziness and giddiness: Secondary | ICD-10-CM

## 2018-03-15 NOTE — Progress Notes (Signed)
[place] [phone]   [phone]  OFFICE PROGRESS NOTE  Copland, Gay Filler, MD [address] [place] [address]  DIAGNOSIS: DIAGNOSIS: stage IV (T3, N2, M1 a) non-small cell lung cancer, poorly differentiated adenocarcinoma with extensive miliary distribution in the lungs bilaterally diagnosed in September 2018. 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 NEGATIVE for the Exon 20 [id number] mutation. Repeat molecular studies by guardant 360 at time of progression showed ATM [id number]  PRIOR THERAPY: Gilotrif 40 mg daily started 11/08/2016.Status post 13 months of treatment.  CURRENT THERAPY: Tagrisso 80 mg p.o. daily.  First dose started January 01, 2018.  Status post 2 months of treatment.  INTERVAL HISTORY: [name] 53y.o. female returns to the clinic today for follow-up visit.  The patient is feeling fine today with no concerning complaints.  She denied having any chest pain, shortness of breath, cough or hemoptysis.  She denied having any recent weight loss or night sweats.  She has no nausea, vomiting, diarrhea or constipation.  She denied having any skin rash.  She continues to tolerate her treatment with Tagrisso fairly well.  The patient had repeat MRI of the brain as well as CT scan of the chest, abdomen and pelvis performed recently and she is here for evaluation and discussion of her imaging studies and treatment options.  MEDICAL HISTORY: Past Medical History:  Diagnosis Date  . Adenocarcinoma of left lung, stage 4 ([place] 10/28/2016  . Goals of care, counseling/discussion 10/28/2016  . Hypertension     ALLERGIES:  is allergic to percocet [oxycodone-acetaminophen]; lisinopril; and losartan.  MEDICATIONS:  Current Outpatient Medications  Medication Sig Dispense Refill  . ammonium lactate (LAC-HYDRIN) 12 % lotion Apply topically as needed for dry skin. 400 g 0  . clindamycin (CLINDAGEL) 1 % gel APPLY TOPICALLY 2  TIMES DAILY. 30 g 1  . loperamide (IMODIUM) 1 MG/5ML solution Take 2 mg as needed by mouth for diarrhea or loose stools.    . magnesium oxide (MAG-OX) 400 (241.3 Mg) MG tablet Take 1 tablet (400 mg total) by mouth daily.    . methocarbamol (ROBAXIN) 500 MG tablet TAKE 1 TABLET (500 MG TOTAL) BY MOUTH EVERY 8 (EIGHT) HOURS AS NEEDED FOR MUSCLE SPASMS. 30 tablet 5  . osimertinib mesylate (TAGRISSO) 80 MG tablet Take 1 tablet (80 mg total) by mouth daily. 30 tablet 2  . Oxycodone HCl 10 MG TABS Take 1 tablet (10 mg total) by mouth 3 (three) times daily as needed. 45 tablet 0  . pantoprazole (PROTONIX) 40 MG tablet Take 1 tablet (40 mg total) by mouth daily at 12 noon. 90 tablet 3  . potassium chloride SA (K-DUR,KLOR-CON) 20 MEQ tablet Take 1 tablet (20 mEq total) by mouth 2 (two) times daily. 180 tablet 3  . benzonatate (TESSALON) 100 MG capsule Take 1 capsule (100 mg total) by mouth 3 (three) times daily as needed for cough. (Patient not taking: Reported on 03/15/2018) 30 capsule 0  . fluticasone (FLONASE) 50 MCG/ACT nasal spray Place 2 sprays into both nostrils daily. (Patient not taking: Reported on 03/15/2018) 16 g 1  . furosemide (LASIX) 40 MG tablet Take 1 tablet (40 mg total) by mouth daily. 90 tablet 3  . prochlorperazine (COMPAZINE) 10 MG tablet Take 1 tablet (10 mg total) every 6 (six) hours as needed by mouth for nausea or vomiting. (Patient not taking: Reported on 03/15/2018) 30 tablet 1  . senna-docusate (SENOKOT-S)  8.6-50 MG tablet Take 1 tablet by mouth at bedtime. (Patient not taking: Reported on 03/15/2018)     No current facility-administered medications for this visit.     SURGICAL HISTORY:  Past Surgical History:  Procedure Laterality Date  . BREAST EXCISIONAL BIOPSY Left 20+ yrs ago   benign  . PERICARDIAL WINDOW N/A 01/10/2017   Procedure: PERICARDIAL WINDOW- SUB XYPHOID, RIGHT CHEST TUBE;  Surgeon: Ivin Poot, MD;  Location: [facility];  Service: Thoracic;  Laterality: N/A;    . VIDEO BRONCHOSCOPY Bilateral 10/21/2016   Procedure: VIDEO BRONCHOSCOPY WITH FLUORO;  Surgeon: Tanda Rockers, MD;  Location: [facility];  Service: Cardiopulmonary;  Laterality: Bilateral;    REVIEW OF SYSTEMS:  Constitutional: negative Eyes: negative Ears, nose, mouth, throat, and face: negative Respiratory: negative Cardiovascular: negative Gastrointestinal: negative Genitourinary:negative Integument/breast: negative Hematologic/lymphatic: negative Musculoskeletal:negative Neurological: negative Behavioral/Psych: negative Endocrine: negative Allergic/Immunologic: negative   PHYSICAL EXAMINATION: General appearance: alert, cooperative, fatigued and no distress Head: Normocephalic, without obvious abnormality, atraumatic Neck: no adenopathy, no JVD, supple, symmetrical, trachea midline and thyroid not enlarged, symmetric, no tenderness/mass/nodules Lymph nodes: Cervical, supraclavicular, and axillary nodes normal. Resp: clear to auscultation bilaterally Back: symmetric, no curvature. ROM normal. No CVA tenderness. Cardio: regular rate and rhythm, S1, S2 normal, no murmur, click, rub or gallop GI: soft, non-tender; bowel sounds normal; no masses,  no organomegaly Extremities: extremities normal, atraumatic, no cyanosis or edema Neurologic: Alert and oriented X 3, normal strength and tone. Normal symmetric reflexes. Normal coordination and gait  ECOG PERFORMANCE STATUS: 0 - Asymptomatic  Blood pressure (!) 142/74, pulse 70, temperature 98.1 F (36.7 C), temperature source Oral, resp. rate 17, height '5\' 6"'$  (1.676 m), weight 242 lb 1.6 oz (109.8 kg), last menstrual period 03/15/2010, SpO2 100 %.  LABORATORY DATA: Lab Results  Component Value Date   WBC 7.5 03/08/2018   HGB 13.0 03/08/2018   HCT 42.4 03/08/2018   MCV 92.8 03/08/2018   PLT 219 03/08/2018      Chemistry      Component Value Date/Time   NA 142 03/08/2018 1402   NA 135 (L) 01/08/2017 1055   K 4.6  03/08/2018 1402   K 3.2 (L) 01/08/2017 1055   CL 105 03/08/2018 1402   CO2 29 03/08/2018 1402   CO2 25 01/08/2017 1055   BUN 13 03/08/2018 1402   BUN 14.6 01/08/2017 1055   CREATININE 1.23 (H) 03/08/2018 1402   CREATININE 1.3 (H) 01/08/2017 1055      Component Value Date/Time   CALCIUM 9.4 03/08/2018 1402   CALCIUM 9.5 01/08/2017 1055   ALKPHOS 96 03/08/2018 1402   ALKPHOS 82 01/08/2017 1055   AST 16 03/08/2018 1402   AST 31 01/08/2017 1055   ALT 10 03/08/2018 1402   ALT 34 01/08/2017 1055   BILITOT 0.4 03/08/2018 1402   BILITOT 1.74 (H) 01/08/2017 1055       RADIOGRAPHIC STUDIES: Ct Chest W Contrast  Result Date: 03/08/2018 CLINICAL DATA:  Restaging of metastatic lung cancer EXAM: CT CHEST, ABDOMEN, AND PELVIS WITH CONTRAST TECHNIQUE: Multidetector CT imaging of the chest, abdomen and pelvis was performed following the standard protocol during bolus administration of intravenous contrast. CONTRAST:  140m OMNIPAQUE IOHEXOL 300 MG/ML  SOLN COMPARISON:  Multiple exams, including 12/25/2017 FINDINGS: CT CHEST FINDINGS Cardiovascular: Unremarkable Mediastinum/Nodes: 8 mm AP window lymph node, stable by my measurements. Small anterior pericardial effusion, stable. Lungs/Pleura: Previous trace left pleural effusion has nearly resolved. There is some continued pleural thickening measuring up  to 7 mm on image 19/2. There is also some high density pleural thickening along the right posterior costophrenic angle measuring up to 5 mm in thickness which is stable and which favors prior talc pleurodesis. A right upper lobe nodule posteriorly measures 1.2 by 1.2 cm, formerly 1.5 by 1.3 cm by my measurements. A 0.8 by 0.7 cm left lower lobe pulmonary nodule on image 57/6 previously measured 0.5 by 0.7 cm, and has slightly enlarged. However, for the most part many of the pulmonary nodules shown previously have completely resolved and others are much less readily apparent and prominently reduced in size.  For example, a 7 by 6 mm right upper lobe pulmonary nodule formerly shown on image 40/7 of the 12/25/2017 exam has resolved. Ground-glass opacities with associated mild nodularity noted in both lungs with sparing of the periphery, similar to the prior exam although with some slight reduction in the nodular component. Musculoskeletal: Subtle sclerotic lesion posteriorly in the left side of the T3 vertebra on image 75/5, unchanged from prior exam. No new or progressive bony lesions are identified. CT ABDOMEN PELVIS FINDINGS Hepatobiliary: Unremarkable Pancreas: Unremarkable Spleen: Unremarkable Adrenals/Urinary Tract: Unremarkable Stomach/Bowel: Unremarkable Vascular/Lymphatic: Aortoiliac atherosclerotic vascular disease. No pathologic adenopathy identified. Reproductive: Uterus absent.  Adnexa unremarkable. Other: No supplemental non-categorized findings. Musculoskeletal: Small umbilical hernia contains adipose tissue. IMPRESSION: 1. Considerable improvement, with resolution of many of the pulmonary nodules and reduction in size/conspicuity of many of the rest of the pulmonary nodules. 2. Continued hazy ground-glass central densities in the lungs with faint nodularity, a component of lymphangitic spread of tumor is not excluded in these areas. 3. No findings of metastatic involvement in the abdomen/pelvis. 4. Subtle sclerotic lesion posteriorly in the left T3 vertebral body, likely residuum from prior metastatic lesion. 5.  Aortic Atherosclerosis (ICD10-I70.0). 6. Calcification along the right lung base posteriorly compatible with prior talc pleurodesis. Electronically Signed   By: Van Clines M.D.   On: 03/08/2018 16:20   Mr Jeri Cos [place] Contrast  Result Date: 03/03/2018 CLINICAL DATA:  Followup brain metastases EXAM: MRI HEAD WITHOUT AND WITH CONTRAST TECHNIQUE: Multiplanar, multiecho pulse sequences of the brain and surrounding structures were obtained without and with intravenous contrast. CONTRAST:  10  cc Gadavist COMPARISON:  01/05/2018 FINDINGS: Brain: There has been a favorable response to therapy. Innumerable subcentimeter metastatic lesions demonstrated on 01/05/2018 are largely now invisible except for numerous foci of hemosiderin deposition on susceptibility weighted imaging. There are several minimal residual enhancing foci of measuring a few mm in size towards the vertex on both sides. There is no new or enlarging lesion. There is no vasogenic edema or mass effect. No hydrocephalus. Vascular: Major vessels at the base of the brain show flow. Skull and upper cervical spine: Negative Sinuses/Orbits: Clear/normal Other: None IMPRESSION: Favorable response to therapy. Innumerable subcentimeter brain metastases have all responded. Many no longer show T2 signal or contrast enhancement. Susceptibility weighted imaging shows foci of hemosiderin deposition at the location of most of the treated lesions. A few the lesions continue to show minimal contrast enhancement, 3 mm or less, particularly towards the vertex. There are no new or enlarging lesions. No mass effect or edema. Electronically Signed   By: Nelson Chimes M.D.   On: 03/03/2018 08:00   Ct Abdomen Pelvis W Contrast  Result Date: 03/08/2018 CLINICAL DATA:  Restaging of metastatic lung cancer EXAM: CT CHEST, ABDOMEN, AND PELVIS WITH CONTRAST TECHNIQUE: Multidetector CT imaging of the chest, abdomen and pelvis was performed following the  standard protocol during bolus administration of intravenous contrast. CONTRAST:  130m OMNIPAQUE IOHEXOL 300 MG/ML  SOLN COMPARISON:  Multiple exams, including 12/25/2017 FINDINGS: CT CHEST FINDINGS Cardiovascular: Unremarkable Mediastinum/Nodes: 8 mm AP window lymph node, stable by my measurements. Small anterior pericardial effusion, stable. Lungs/Pleura: Previous trace left pleural effusion has nearly resolved. There is some continued pleural thickening measuring up to 7 mm on image 19/2. There is also some high  density pleural thickening along the right posterior costophrenic angle measuring up to 5 mm in thickness which is stable and which favors prior talc pleurodesis. A right upper lobe nodule posteriorly measures 1.2 by 1.2 cm, formerly 1.5 by 1.3 cm by my measurements. A 0.8 by 0.7 cm left lower lobe pulmonary nodule on image 57/6 previously measured 0.5 by 0.7 cm, and has slightly enlarged. However, for the most part many of the pulmonary nodules shown previously have completely resolved and others are much less readily apparent and prominently reduced in size. For example, a 7 by 6 mm right upper lobe pulmonary nodule formerly shown on image 40/7 of the 12/25/2017 exam has resolved. Ground-glass opacities with associated mild nodularity noted in both lungs with sparing of the periphery, similar to the prior exam although with some slight reduction in the nodular component. Musculoskeletal: Subtle sclerotic lesion posteriorly in the left side of the T3 vertebra on image 75/5, unchanged from prior exam. No new or progressive bony lesions are identified. CT ABDOMEN PELVIS FINDINGS Hepatobiliary: Unremarkable Pancreas: Unremarkable Spleen: Unremarkable Adrenals/Urinary Tract: Unremarkable Stomach/Bowel: Unremarkable Vascular/Lymphatic: Aortoiliac atherosclerotic vascular disease. No pathologic adenopathy identified. Reproductive: Uterus absent.  Adnexa unremarkable. Other: No supplemental non-categorized findings. Musculoskeletal: Small umbilical hernia contains adipose tissue. IMPRESSION: 1. Considerable improvement, with resolution of many of the pulmonary nodules and reduction in size/conspicuity of many of the rest of the pulmonary nodules. 2. Continued hazy ground-glass central densities in the lungs with faint nodularity, a component of lymphangitic spread of tumor is not excluded in these areas. 3. No findings of metastatic involvement in the abdomen/pelvis. 4. Subtle sclerotic lesion posteriorly in the left T3  vertebral body, likely residuum from prior metastatic lesion. 5.  Aortic Atherosclerosis (ICD10-I70.0). 6. Calcification along the right lung base posteriorly compatible with prior talc pleurodesis. Electronically Signed   By: WVan ClinesM.D.   On: 03/08/2018 16:20    ASSESSMENT AND PLAN: This is a very pleasant 53years old African-American female with metastatic non-small cell lung cancer, adenocarcinoma and positive for EGFR mutation with deletion in exon 19.  The patient is currently on treatment with GILOTRIF 40 mg p.o. daily status post 13 months.  The patient is tolerating this treatment well except for few episodes of diarrhea and mild skin rash. She is complaining of dizzy spells and vertigo recently. Repeat CT scan of the chest, abdomen and pelvis performed recently showed some evidence for disease progression with increase in size and number of pulmonary nodules. Her treatment was switched to Tagrisso 80 mg p.o. daily started January 01, 2018.  She is status post 2 months of treatment The patient is tolerating her treatment with Tagrisso fairly well with no concerning complaints. She had repeat MRI of the brain as well as CT scan of the chest, abdomen and pelvis performed recently.  I personally and independently reviewed the scan images and discussed the results with the patient today.. Her MRI of the brain and CT scan of the chest, abdomen and pelvis showed significant improvement of her disease. I recommended for the  patient to continue her current treatment with Tagrisso with the same dose. I will see her back for follow-up visit in 1 month for evaluation and repeat blood work. The patient was advised to call immediately if she has any concerning symptoms in the interval. The patient voices understanding of current disease status and treatment options and is in agreement with the current care plan. All questions were answered. The patient knows to call the clinic with any  problems, questions or concerns. We can certainly see the patient much sooner if necessary.  Disclaimer: This note was dictated with voice recognition software. Similar sounding words can inadvertently be transcribed and may not be corrected upon review.

## 2018-03-15 NOTE — Telephone Encounter (Signed)
Oral Oncology Patient Advocate Encounter  I faxed the completed [employer] manufacturer assistance application for Tagrisso today, 03/15/18.  I did notate on the cover sheet that the patients current insurance plan will expire on 03/27/18.  This encounter will be updated until final determination.  [facility] Patient [place] Phone [phone] Fax [phone]

## 2018-03-16 ENCOUNTER — Other Ambulatory Visit: Payer: Self-pay | Admitting: Internal Medicine

## 2018-03-16 DIAGNOSIS — C3490 Malignant neoplasm of unspecified part of unspecified bronchus or lung: Secondary | ICD-10-CM

## 2018-03-16 NOTE — Telephone Encounter (Signed)
Oral Oncology Patient Advocate Encounter  I received a call from [place] at [facility] [place], she stated that she was in processing with the application. Loma Sousa is not able to complete the process because the patient currently has insurance. I was instructed to fax the application again in March when she does not have coverage.  Loma Sousa also said that [employer] is not able to send medicine to a PO Box so I will need to get a physical address for the patient.  I called the patient to give her this information and left her a voicemail.  [place] [facility] Patient [place] Phone [phone] Fax [phone]

## 2018-03-17 NOTE — Telephone Encounter (Signed)
Oral Oncology Patient Advocate Encounter  Nancy Moore returned my call. She gave me a physical address to add to the application. I explained everything else to her and she verbalized understanding and great appreciation.  She will be getting one more refill with her insurance in February.  [facility] Patient [name] Phone [phone] Fax [phone]

## 2018-03-24 ENCOUNTER — Ambulatory Visit (INDEPENDENT_AMBULATORY_CARE_PROVIDER_SITE_OTHER): Payer: 59 | Admitting: Family Medicine

## 2018-03-24 ENCOUNTER — Encounter: Payer: Self-pay | Admitting: Family Medicine

## 2018-03-24 VITALS — BP 116/82 | HR 82 | Temp 98.1°F | Resp 12 | Ht 66.0 in | Wt 244.0 lb

## 2018-03-24 DIAGNOSIS — C3492 Malignant neoplasm of unspecified part of left bronchus or lung: Secondary | ICD-10-CM | POA: Diagnosis not present

## 2018-03-24 DIAGNOSIS — Z0000 Encounter for general adult medical examination without abnormal findings: Secondary | ICD-10-CM | POA: Diagnosis not present

## 2018-03-24 DIAGNOSIS — Z13220 Encounter for screening for lipoid disorders: Secondary | ICD-10-CM

## 2018-03-24 DIAGNOSIS — Z1239 Encounter for other screening for malignant neoplasm of breast: Secondary | ICD-10-CM | POA: Diagnosis not present

## 2018-03-24 DIAGNOSIS — R52 Pain, unspecified: Secondary | ICD-10-CM

## 2018-03-24 MED ORDER — OXYCODONE HCL 10 MG PO TABS: 10.0000 mg | ORAL_TABLET | Freq: Three times a day (TID) | ORAL | 0 refills | Status: DC | PRN

## 2018-03-24 MED FILL — oxyCODONE HCL 10 MG TABS: 10 | 20 days supply | Qty: 60 | Fill #0

## 2018-03-24 NOTE — Patient Instructions (Addendum)
It was great to see you today, I am so glad your most recent scan showed good news I refilled your oxycodone today, for you to use as needed I would like to have you see an orthopedist for your shoulder, but I know we have to work around your insurance coverage.  If you are able to keep coverage let me know and I will be glad to refer you Otherwise we can plan to have you seen once you get Medicare.  In the meantime, please work to maintain your range of motion  I think you are also due for colon cancer screening.  However, this may also need to wait until your Medicare kicks in You may also be due for a tetanus vaccine.  If you have any significant wound please get this vaccine asap.    Health Maintenance, Female Adopting a healthy lifestyle and getting preventive care can go a long way to promote health and wellness. Talk with your health care provider about what schedule of regular examinations is right for you. This is a good chance for you to check in with your provider about disease prevention and staying healthy. In between checkups, there are plenty of things you can do on your own. Experts have done a lot of research about which lifestyle changes and preventive measures are most likely to keep you healthy. Ask your health care provider for more information. Weight and diet Eat a healthy diet  Be sure to include plenty of vegetables, fruits, low-fat dairy products, and lean protein.  Do not eat a lot of foods high in solid fats, added sugars, or salt.  Get regular exercise. This is one of the most important things you can do for your health. ? Most adults should exercise for at least 150 minutes each week. The exercise should increase your heart rate and make you sweat (moderate-intensity exercise). ? Most adults should also do strengthening exercises at least twice a week. This is in addition to the moderate-intensity exercise. Maintain a healthy weight  Body mass index (BMI) is a  measurement that can be used to identify possible weight problems. It estimates body fat based on height and weight. Your health care provider can help determine your BMI and help you achieve or maintain a healthy weight.  For females 21 years of age and older: ? A BMI below 18.5 is considered underweight. ? A BMI of 18.5 to 24.9 is normal. ? A BMI of 25 to 29.9 is considered overweight. ? A BMI of 30 and above is considered obese. Watch levels of cholesterol and blood lipids  You should start having your blood tested for lipids and cholesterol at 52 years of age, then have this test every 5 years.  You may need to have your cholesterol levels checked more often if: ? Your lipid or cholesterol levels are high. ? You are older than 52 years of age. ? You are at high risk for heart disease. Cancer screening Lung Cancer  Lung cancer screening is recommended for adults 44-36 years old who are at high risk for lung cancer because of a history of smoking.  A yearly low-dose CT scan of the lungs is recommended for people who: ? Currently smoke. ? Have quit within the past 15 years. ? Have at least a 30-pack-year history of smoking. A pack year is smoking an average of one pack of cigarettes a day for 1 year.  Yearly screening should continue until it has been 15 years  since you quit.  Yearly screening should stop if you develop a health problem that would prevent you from having lung cancer treatment. Breast Cancer  Practice breast self-awareness. This means understanding how your breasts normally appear and feel.  It also means doing regular breast self-exams. Let your health care provider know about any changes, no matter how small.  If you are in your 20s or 30s, you should have a clinical breast exam (CBE) by a health care provider every 1-3 years as part of a regular health exam.  If you are 38 or older, have a CBE every year. Also consider having a breast X-ray (mammogram) every  year.  If you have a family history of breast cancer, talk to your health care provider about genetic screening.  If you are at high risk for breast cancer, talk to your health care provider about having an MRI and a mammogram every year.  Breast cancer gene (BRCA) assessment is recommended for women who have family members with BRCA-related cancers. BRCA-related cancers include: ? Breast. ? Ovarian. ? Tubal. ? Peritoneal cancers.  Results of the assessment will determine the need for genetic counseling and BRCA1 and BRCA2 testing. Cervical Cancer Your health care provider may recommend that you be screened regularly for cancer of the pelvic organs (ovaries, uterus, and vagina). This screening involves a pelvic examination, including checking for microscopic changes to the surface of your cervix (Pap test). You may be encouraged to have this screening done every 3 years, beginning at age 39.  For women ages 54-65, health care providers may recommend pelvic exams and Pap testing every 3 years, or they may recommend the Pap and pelvic exam, combined with testing for human papilloma virus (HPV), every 5 years. Some types of HPV increase your risk of cervical cancer. Testing for HPV may also be done on women of any age with unclear Pap test results.  Other health care providers may not recommend any screening for nonpregnant women who are considered low risk for pelvic cancer and who do not have symptoms. Ask your health care provider if a screening pelvic exam is right for you.  If you have had past treatment for cervical cancer or a condition that could lead to cancer, you need Pap tests and screening for cancer for at least 20 years after your treatment. If Pap tests have been discontinued, your risk factors (such as having a new sexual partner) need to be reassessed to determine if screening should resume. Some women have medical problems that increase the chance of getting cervical cancer. In  these cases, your health care provider may recommend more frequent screening and Pap tests. Colorectal Cancer  This type of cancer can be detected and often prevented.  Routine colorectal cancer screening usually begins at 52 years of age and continues through 52 years of age.  Your health care provider may recommend screening at an earlier age if you have risk factors for colon cancer.  Your health care provider may also recommend using home test kits to check for hidden blood in the stool.  A small camera at the end of a tube can be used to examine your colon directly (sigmoidoscopy or colonoscopy). This is done to check for the earliest forms of colorectal cancer.  Routine screening usually begins at age 73.  Direct examination of the colon should be repeated every 5-10 years through 52 years of age. However, you may need to be screened more often if early forms of  precancerous polyps or small growths are found. Skin Cancer  Check your skin from head to toe regularly.  Tell your health care provider about any new moles or changes in moles, especially if there is a change in a mole's shape or color.  Also tell your health care provider if you have a mole that is larger than the size of a pencil eraser.  Always use sunscreen. Apply sunscreen liberally and repeatedly throughout the day.  Protect yourself by wearing long sleeves, pants, a wide-brimmed hat, and sunglasses whenever you are outside. Heart disease, diabetes, and high blood pressure  High blood pressure causes heart disease and increases the risk of stroke. High blood pressure is more likely to develop in: ? People who have blood pressure in the high end of the normal range (130-139/85-89 mm Hg). ? People who are overweight or obese. ? People who are African American.  If you are 65-80 years of age, have your blood pressure checked every 3-5 years. If you are 69 years of age or older, have your blood pressure checked  every year. You should have your blood pressure measured twice-once when you are at a hospital or clinic, and once when you are not at a hospital or clinic. Record the average of the two measurements. To check your blood pressure when you are not at a hospital or clinic, you can use: ? An automated blood pressure machine at a pharmacy. ? A home blood pressure monitor.  If you are between 67 years and 106 years old, ask your health care provider if you should take aspirin to prevent strokes.  Have regular diabetes screenings. This involves taking a blood sample to check your fasting blood sugar level. ? If you are at a normal weight and have a low risk for diabetes, have this test once every three years after 52 years of age. ? If you are overweight and have a high risk for diabetes, consider being tested at a younger age or more often. Preventing infection Hepatitis B  If you have a higher risk for hepatitis B, you should be screened for this virus. You are considered at high risk for hepatitis B if: ? You were born in a country where hepatitis B is common. Ask your health care provider which countries are considered high risk. ? Your parents were born in a high-risk country, and you have not been immunized against hepatitis B (hepatitis B vaccine). ? You have HIV or AIDS. ? You use needles to inject street drugs. ? You live with someone who has hepatitis B. ? You have had sex with someone who has hepatitis B. ? You get hemodialysis treatment. ? You take certain medicines for conditions, including cancer, organ transplantation, and autoimmune conditions. Hepatitis C  Blood testing is recommended for: ? Everyone born from [birth year] through 1964. ? Anyone with known risk factors for hepatitis C. Sexually transmitted infections (STIs)  You should be screened for sexually transmitted infections (STIs) including gonorrhea and chlamydia if: ? You are sexually active and are younger than 52 years of  age. ? You are older than 52 years of age and your health care provider tells you that you are at risk for this type of infection. ? Your sexual activity has changed since you were last screened and you are at an increased risk for chlamydia or gonorrhea. Ask your health care provider if you are at risk.  If you do not have HIV, but are at risk, it may  be recommended that you take a prescription medicine daily to prevent HIV infection. This is called pre-exposure prophylaxis (PrEP). You are considered at risk if: ? You are sexually active and do not regularly use condoms or know the HIV status of your partner(s). ? You take drugs by injection. ? You are sexually active with a partner who has HIV. Talk with your health care provider about whether you are at high risk of being infected with HIV. If you choose to begin PrEP, you should first be tested for HIV. You should then be tested every 3 months for as long as you are taking PrEP. Pregnancy  If you are premenopausal and you may become pregnant, ask your health care provider about preconception counseling.  If you may become pregnant, take 400 to 800 micrograms (mcg) of folic acid every day.  If you want to prevent pregnancy, talk to your health care provider about birth control (contraception). Osteoporosis and menopause  Osteoporosis is a disease in which the bones lose minerals and strength with aging. This can result in serious bone fractures. Your risk for osteoporosis can be identified using a bone density scan.  If you are 48 years of age or older, or if you are at risk for osteoporosis and fractures, ask your health care provider if you should be screened.  Ask your health care provider whether you should take a calcium or vitamin D supplement to lower your risk for osteoporosis.  Menopause may have certain physical symptoms and risks.  Hormone replacement therapy may reduce some of these symptoms and risks. Talk to your health  care provider about whether hormone replacement therapy is right for you. Follow these instructions at home:  Schedule regular health, dental, and eye exams.  Stay current with your immunizations.  Do not use any tobacco products including cigarettes, chewing tobacco, or electronic cigarettes.  If you are pregnant, do not drink alcohol.  If you are breastfeeding, limit how much and how often you drink alcohol.  Limit alcohol intake to no more than 1 drink per day for nonpregnant women. One drink equals 12 ounces of beer, 5 ounces of wine, or 1 ounces of hard liquor.  Do not use street drugs.  Do not share needles.  Ask your health care provider for help if you need support or information about quitting drugs.  Tell your health care provider if you often feel depressed.  Tell your health care provider if you have ever been abused or do not feel safe at home. This information is not intended to replace advice given to you by your health care provider. Make sure you discuss any questions you have with your health care provider. Document Released: 07/29/2010 Document Revised: 06/21/2015 Document Reviewed: 10/17/2014 Elsevier Interactive Patient Education  2019 [employer].

## 2018-03-24 NOTE — Progress Notes (Signed)
[place] at [facility] [place], [place], [place] [address] [phone]  Date:  03/24/2018   Name:  Nancy Moore   DOB:  06-Nov-1965   MRN:  [id number]  PCP:  Darreld Mclean, MD    Chief Complaint: Annual Exam   History of Present Illness:  Nancy Moore is a 53 y.o. very pleasant female patient who presents with the following:  Here today for a CPE I saw her most recently in October for shoulder pain History of stage IV primary non-small cell lung cancer- now has mets for brain   She saw oncology last week.  She is now on Tagrisso 80 mg daily. She really does not have any SE except for some diarrhea She just had restaging scans-thankfully she appears to be responding to [place], as her most recent scan showed improvement of her disease Follow-up with oncology in 1 month  Pap: s/p hyst  Mammo: July 2018, will order for her  Labs: Needs a lipid panel Immn: Up-to-date, question shingles vaccine.  Will ask her oncologist if she should have this I am also not sure about a tetanus vaccine for her Advised her that she is likely due for routine tetanus booster, but I am uncertain if this is a good idea given her current treatment.  Did advise her that if she has any significant or dirty wound she should get a booster Colon cancer screen: Appears to be due  She has noted some right shoulder pain for some time.  She has seen sports medicine, and is doing a home exercise program She has significant reduction in her range of motion, and I really think she should see orthopedics. However, she was finally fired from her job due to her illness.  She will be losing her insurance benefits soon.  Thankfully she will be getting Medicare, but this does not start for another year Therefore will defer an orthopedics referral for now  We went over any medications that she might need coming up.  She does use oxycodone periodically for pain  and has not filled this in some time.  I will refill for her today  08/19/2017  1   08/19/2017  Oxycodone Hcl 10 MG Tablet  45.00 15 Je Cop   [id number]   Mos (0906)   0  45.00 MME  Comm Ins   [place]  02/11/2017  1   02/11/2017  Oxycodone Hcl 10 MG Tablet  90.00 30 Je Cop   [id number]   Med (5269)   0  45.00 MME  Comm Ins   [place]  11/14/2016  1   10/29/2016  Tramadol Hcl 50 MG Tablet  28.00 3 Mi Wer   [id number]   Mos (0906)   0  46.67 MME  Comm Ins   [place]  11/07/2016  1   11/07/2016  Tramadol Hcl 50 MG Tablet  28.00 3 Mi Wer   [id number]   Mos (0906)   0  46.67 MME  Comm Ins   [place]  10/31/2016  1   10/31/2016  Lorazepam 0.5 MG Tablet  1.00 1 Ab Moh   [id number]   Mos (0906)   0  0.50 LME  Comm Ins   [place]  09/27/2016  1   09/27/2016  Hydromet Syrup  180.00 5 Al Lue   [id number]   Wal (3642)   0  36.00 MME       Patient Active  Problem List   Diagnosis Date Noted  . Primary malignant neoplasm of lung with metastasis to brain ([place]) 02/05/2018  . Cardiac/pericardial tamponade   . Chest tube in place   . Acute lower UTI   . Pain   . Acute blood loss anemia   . Palliative care encounter   . AKI (acute kidney injury) ([place]) 01/10/2017  . Hypotension 01/09/2017  . Essential hypertension 01/09/2017  . Hypokalemia 01/09/2017  . Dyspnea 01/09/2017  . Leukocytosis 01/09/2017  . Pleural effusion on right 01/08/2017  . Encounter for antineoplastic chemotherapy 11/20/2016  . Adenocarcinoma of left lung, stage 4 ([place]) 10/28/2016  . Goals of care, counseling/discussion 10/28/2016  . Upper airway cough syndrome 10/16/2016  . Multiple pulmonary nodules 10/15/2016    Past Medical History:  Diagnosis Date  . Adenocarcinoma of left lung, stage 4 ([place]) 10/28/2016  . Goals of care, counseling/discussion 10/28/2016  . Hypertension     Past Surgical History:  Procedure Laterality Date  . BREAST EXCISIONAL BIOPSY Left 20+ yrs ago   benign  . PERICARDIAL WINDOW N/A 01/10/2017   Procedure: PERICARDIAL WINDOW- SUB XYPHOID, RIGHT CHEST  TUBE;  Surgeon: Ivin Poot, MD;  Location: [facility];  Service: Thoracic;  Laterality: N/A;  . VIDEO BRONCHOSCOPY Bilateral 10/21/2016   Procedure: VIDEO BRONCHOSCOPY WITH FLUORO;  Surgeon: Tanda Rockers, MD;  Location: [facility];  Service: Cardiopulmonary;  Laterality: Bilateral;    Social History   Tobacco Use  . Smoking status: Never Smoker  . Smokeless tobacco: Never Used  Substance Use Topics  . Alcohol use: Yes    Comment: socially  . Drug use: No    Family History  Problem Relation Age of Onset  . Asthma Mother   . Stroke Mother   . Hypertension Mother   . Heart attack Father   . Hypertension Father   . Hyperlipidemia Father   . Dementia Father   . Emphysema Maternal Grandmother   . Hypertension Maternal Grandmother   . Colon cancer Paternal Grandmother   . Brain cancer Maternal Uncle     Allergies  Allergen Reactions  . Percocet [Oxycodone-Acetaminophen] Other (See Comments)    "makes me dizzy"  . Lisinopril Cough  . Losartan     Medication list has been reviewed and updated.  Current Outpatient Medications on File Prior to Visit  Medication Sig Dispense Refill  . ammonium lactate (LAC-HYDRIN) 12 % lotion Apply topically as needed for dry skin. 400 g 0  . benzonatate (TESSALON) 100 MG capsule Take 1 capsule (100 mg total) by mouth 3 (three) times daily as needed for cough. (Patient not taking: Reported on 03/15/2018) 30 capsule 0  . clindamycin (CLINDAGEL) 1 % gel APPLY TOPICALLY 2 TIMES DAILY. 30 g 1  . fluticasone (FLONASE) 50 MCG/ACT nasal spray Place 2 sprays into both nostrils daily. (Patient not taking: Reported on 03/15/2018) 16 g 1  . furosemide (LASIX) 40 MG tablet Take 1 tablet (40 mg total) by mouth daily. 90 tablet 3  . loperamide (IMODIUM) 1 MG/5ML solution Take 2 mg as needed by mouth for diarrhea or loose stools.    . magnesium oxide (MAG-OX) 400 (241.3 Mg) MG tablet Take 1 tablet (400 mg total) by mouth daily.    . methocarbamol (ROBAXIN)  500 MG tablet TAKE 1 TABLET (500 MG TOTAL) BY MOUTH EVERY 8 (EIGHT) HOURS AS NEEDED FOR MUSCLE SPASMS. 30 tablet 5  . Oxycodone HCl 10 MG TABS Take 1 tablet (10 mg total) by mouth  3 (three) times daily as needed. 45 tablet 0  . pantoprazole (PROTONIX) 40 MG tablet Take 1 tablet (40 mg total) by mouth daily at 12 noon. 90 tablet 3  . potassium chloride SA (K-DUR,KLOR-CON) 20 MEQ tablet Take 1 tablet (20 mEq total) by mouth 2 (two) times daily. 180 tablet 3  . prochlorperazine (COMPAZINE) 10 MG tablet Take 1 tablet (10 mg total) every 6 (six) hours as needed by mouth for nausea or vomiting. (Patient not taking: Reported on 03/15/2018) 30 tablet 1  . senna-docusate (SENOKOT-S) 8.6-50 MG tablet Take 1 tablet by mouth at bedtime. (Patient not taking: Reported on 03/15/2018)    . TAGRISSO 80 MG tablet TAKE 1 TABLET BY MOUTH DAILY.  30 tablet 1   No current facility-administered medications on file prior to visit.     Review of Systems:  As per HPI- otherwise negative. No fever or chills, she is overall feeling well She denies any depression, states that she is using prayer and her face to get through her illness  Physical Examination: Vitals:   03/24/18 1543  BP: 116/82  Pulse: 82  Resp: 12  Temp: 98.1 F (36.7 C)  SpO2: 97%   Vitals:   03/24/18 1543  Weight: 244 lb (110.7 kg)  Height: 5\' 6"  (1.676 m)   Body mass index is 39.38 kg/m. Ideal Body Weight: Weight in (lb) to have BMI = 25: 154.6  GEN: WDWN, NAD, Non-toxic, A & O x 3, obese, looks well HEENT: Atraumatic, Normocephalic. Neck supple. No masses, No LAD.  Bilateral TM wnl, oropharynx normal.  PEERL,EOMI.   Ears and Nose: No external deformity. CV: RRR, No M/G/R. No JVD. No thrill. No extra heart sounds. PULM: CTA B, no wheezes, crackles, rhonchi. No retractions. No resp. distress. No accessory muscle use. ABD: S, NT, ND, +BS. No rebound. No HSM. EXTR: No c/c/e NEURO Normal gait.  PSYCH: Normally interactive. Conversant. Not  depressed or anxious appearing.  Calm demeanor.  Breast exam is normal   Assessment and Plan: Physical exam  Adenocarcinoma of left lung, stage 4 (HCC) - Plan: Oxycodone HCl 10 MG TABS  Screening for breast cancer - Plan: MM 3D SCREEN BREAST BILATERAL, CANCELED: MM DIAG BREAST TOMO BILATERAL  Pain - Plan: Oxycodone HCl 10 MG TABS  Screening for hyperlipidemia - Plan: Lipid panel  Patient with stage IV lung cancer including mets to brain.  Here today for a follow-up visit Her care is unfortunately complicated by impending loss of insurance coverage.  She may do Cobra, but is not sure she can afford it.  She will be getting Medicare, but this is about a year away For the time being encouraged her to work on range of motion exercises for her shoulder We ordered a mammogram, and will hope to get this done before insurance expires at the end of the month We will defer colon cancer screening for now, due to payment concerns I will be in touch with her pending her labs  Signed Lamar Blinks, MD

## 2018-03-25 ENCOUNTER — Other Ambulatory Visit: Payer: 59

## 2018-03-25 ENCOUNTER — Encounter: Payer: Self-pay | Admitting: Family Medicine

## 2018-03-25 ENCOUNTER — Other Ambulatory Visit: Payer: Self-pay | Admitting: Oncology

## 2018-03-25 ENCOUNTER — Other Ambulatory Visit: Payer: Self-pay | Admitting: Family Medicine

## 2018-03-25 DIAGNOSIS — R609 Edema, unspecified: Secondary | ICD-10-CM

## 2018-03-25 DIAGNOSIS — L853 Xerosis cutis: Secondary | ICD-10-CM

## 2018-03-25 DIAGNOSIS — C3492 Malignant neoplasm of unspecified part of left bronchus or lung: Secondary | ICD-10-CM

## 2018-03-25 MED FILL — METHOCARBAMOL 500 MG TABLET: 500 | 10 days supply | Qty: 30 | Fill #2

## 2018-03-25 MED FILL — POTASSIUM CHLORIDE CRYS ER: 20 | 30 days supply | Qty: 60 | Fill #3

## 2018-03-25 MED FILL — PANTOPRAZOLE SOD DR 40 MG T: 40 | 30 days supply | Qty: 30 | Fill #3

## 2018-03-26 ENCOUNTER — Ambulatory Visit (HOSPITAL_BASED_OUTPATIENT_CLINIC_OR_DEPARTMENT_OTHER)
Admission: RE | Admit: 2018-03-26 | Discharge: 2018-03-26 | Disposition: A | Discharge status: Home / Self Care | Payer: 59 | Source: Ambulatory Visit | Attending: Family Medicine | Admitting: Family Medicine

## 2018-03-26 ENCOUNTER — Other Ambulatory Visit (INDEPENDENT_AMBULATORY_CARE_PROVIDER_SITE_OTHER): Payer: 59

## 2018-03-26 ENCOUNTER — Encounter (HOSPITAL_BASED_OUTPATIENT_CLINIC_OR_DEPARTMENT_OTHER): Payer: Self-pay

## 2018-03-26 DIAGNOSIS — Z13220 Encounter for screening for lipoid disorders: Secondary | ICD-10-CM

## 2018-03-26 DIAGNOSIS — Z1239 Encounter for other screening for malignant neoplasm of breast: Secondary | ICD-10-CM

## 2018-03-26 DIAGNOSIS — Z1231 Encounter for screening mammogram for malignant neoplasm of breast: Secondary | ICD-10-CM | POA: Insufficient documentation

## 2018-03-26 LAB — LIPID PANEL
Cholesterol: 210 mg/dL — ABNORMAL HIGH (ref 0–200)
HDL: 71.3 mg/dL (ref >39.00)
LDL Cholesterol: 125 mg/dL — ABNORMAL HIGH (ref 0–99)
NonHDL: 138.25
Total CHOL/HDL Ratio: 3
Triglycerides: 67 mg/dL (ref 0.0–149.0)
VLDL: 13.4 mg/dL (ref 0.0–40.0)

## 2018-03-26 MED FILL — CLINDAMYCIN PH 1% GEL: 1 | 20 days supply | Qty: 30 | Fill #0

## 2018-03-26 MED FILL — FUROSEMIDE 40 MG TAB: 40 | 30 days supply | Qty: 30 | Fill #0

## 2018-03-26 NOTE — Telephone Encounter (Signed)
Patient calling back to check status of refill. Please advise.

## 2018-03-26 NOTE — Telephone Encounter (Signed)
Please advise 

## 2018-03-26 NOTE — Addendum Note (Signed)
Addended by: Kelle Darting A on: 03/26/2018 11:14 AM   Modules accepted: Orders

## 2018-03-26 NOTE — Telephone Encounter (Signed)
Pt came in office stating that is needing the rx by today since her since expires on 03-27-2018 and she is needing medication soon. Pt also stated would like rx sent to [facility]. Please advise.

## 2018-03-27 ENCOUNTER — Encounter: Payer: Self-pay | Admitting: Family Medicine

## 2018-03-29 NOTE — Telephone Encounter (Signed)
Oral Oncology Patient Advocate Encounter  I re-faxed the Tagrisso manufacturer assistance application to [employer] today.  This encounter will be updated until final determination.  [place] Patient [place] Phone [phone] Fax [phone] 03/29/2018   11:57 AM

## 2018-03-31 NOTE — Telephone Encounter (Signed)
Oral Oncology Patient Advocate Encounter  Nancy Moore has been approved to be filled through the [employer] manufacturer assistance program 03/31/18-03/30/19. [employer] is contracted with [facility] and this is where the Tagrisso will come from. [facility] will mail the Tagrisso to the patients home free of charge. The patient needs to call [employer] 10 days before she needs her refill to schedule that shipment. Ph# [phone].  I called the patient to give her the good news and had to leave a voicemail.  [place] Patient [place] Phone [phone] Fax [phone] 03/31/2018   11:27 AM

## 2018-04-12 ENCOUNTER — Inpatient Hospital Stay: Payer: 59

## 2018-04-12 ENCOUNTER — Encounter: Payer: Self-pay | Admitting: Internal Medicine

## 2018-04-12 ENCOUNTER — Inpatient Hospital Stay: Payer: 59 | Attending: Oncology | Admitting: Internal Medicine

## 2018-04-12 ENCOUNTER — Other Ambulatory Visit: Payer: Self-pay

## 2018-04-12 ENCOUNTER — Telehealth: Payer: Self-pay | Admitting: *Deleted

## 2018-04-12 ENCOUNTER — Telehealth: Payer: Self-pay | Admitting: Internal Medicine

## 2018-04-12 VITALS — BP 138/79 | HR 68 | Temp 98.0°F | Resp 18 | Ht 66.0 in | Wt 241.6 lb

## 2018-04-12 DIAGNOSIS — R42 Dizziness and giddiness: Secondary | ICD-10-CM | POA: Insufficient documentation

## 2018-04-12 DIAGNOSIS — C3492 Malignant neoplasm of unspecified part of left bronchus or lung: Secondary | ICD-10-CM | POA: Insufficient documentation

## 2018-04-12 DIAGNOSIS — R21 Rash and other nonspecific skin eruption: Secondary | ICD-10-CM | POA: Insufficient documentation

## 2018-04-12 DIAGNOSIS — R197 Diarrhea, unspecified: Secondary | ICD-10-CM | POA: Diagnosis not present

## 2018-04-12 DIAGNOSIS — Z5111 Encounter for antineoplastic chemotherapy: Secondary | ICD-10-CM

## 2018-04-12 LAB — CBC WITH DIFFERENTIAL (CANCER CENTER ONLY)
Abs Immature Granulocytes: 0.01 10*3/uL (ref 0.00–0.07)
Basophils Absolute: 0 10*3/uL (ref 0.0–0.1)
Basophils Relative: 1 %
Eosinophils Absolute: 0.2 10*3/uL (ref 0.0–0.5)
Eosinophils Relative: 3 %
HCT: 41.4 % (ref 36.0–46.0)
Hemoglobin: 13.2 g/dL (ref 12.0–15.0)
Immature Granulocytes: 0 %
Lymphocytes Relative: 27 %
Lymphs Abs: 2 10*3/uL (ref 0.7–4.0)
MCH: 28.9 pg (ref 26.0–34.0)
MCHC: 31.9 g/dL (ref 30.0–36.0)
MCV: 90.8 fL (ref 80.0–100.0)
Monocytes Absolute: 0.7 10*3/uL (ref 0.1–1.0)
Monocytes Relative: 10 %
Neutro Abs: 4.4 10*3/uL (ref 1.7–7.7)
Neutrophils Relative %: 59 %
Platelet Count: 197 10*3/uL (ref 150–400)
RBC: 4.56 MIL/uL (ref 3.87–5.11)
RDW: 14.1 % (ref 11.5–15.5)
WBC Count: 7.3 10*3/uL (ref 4.0–10.5)
nRBC: 0 % (ref 0.0–0.2)

## 2018-04-12 LAB — CMP (CANCER CENTER ONLY)
ALT: 16 U/L (ref 0–44)
AST: 18 U/L (ref 15–41)
Albumin: 3.8 g/dL (ref 3.5–5.0)
Alkaline Phosphatase: 100 U/L (ref 38–126)
Anion gap: 13 (ref 5–15)
BUN: 13 mg/dL (ref 6–20)
CO2: 25 mmol/L (ref 22–32)
Calcium: 9.5 mg/dL (ref 8.9–10.3)
Chloride: 104 mmol/L (ref 98–111)
Creatinine: 1.2 mg/dL — ABNORMAL HIGH (ref 0.44–1.00)
GFR, Est AFR Am: >60 mL/min (ref >60)
GFR, Estimated: 52 mL/min — ABNORMAL LOW (ref >60)
Glucose, Bld: 81 mg/dL (ref 70–99)
Potassium: 4.2 mmol/L (ref 3.5–5.1)
Sodium: 142 mmol/L (ref 135–145)
Total Bilirubin: 0.4 mg/dL (ref 0.3–1.2)
Total Protein: 7.4 g/dL (ref 6.5–8.1)

## 2018-04-12 NOTE — Progress Notes (Signed)
[place] Telephone:(336) [phone]   Fax:(336) [phone]  OFFICE PROGRESS NOTE  Nancy Moore, Nancy Filler, MD [address] [place] [address]  DIAGNOSIS: DIAGNOSIS: stage IV (T3, N2, M1 a) non-small cell lung cancer, poorly differentiated adenocarcinoma with extensive miliary distribution in the lungs bilaterally diagnosed in September 2018. 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 NEGATIVE for the Exon 20 T790M mutation. Repeat molecular studies by guardant 360 at time of progression showed ATM [id number]  PRIOR THERAPY: Gilotrif 40 mg daily started 11/08/2016.Status post 13 months of treatment.  CURRENT THERAPY: Tagrisso 80 mg p.o. daily.  First dose started January 01, 2018.  Status post 3 months of treatment.  INTERVAL HISTORY: [name] 53y.o. female returns to the clinic today for follow-up visit accompanied by a family member.  The patient is feeling fine today with no concerning complaints.  She continues to tolerate her treatment with Tagrisso fairly well.  She denied having any current chest pain, shortness of breath, cough or hemoptysis.  She denied having any fever or chills.  She has no nausea, vomiting, diarrhea or constipation.  She denied having any skin rash or itching.  She is here today for evaluation and repeat blood work.  MEDICAL HISTORY: Past Medical History:  Diagnosis Date  . Adenocarcinoma of left lung, stage 4 ([facility] 10/28/2016  . Goals of care, counseling/discussion 10/28/2016  . Hypertension     ALLERGIES:  is allergic to percocet [oxycodone-acetaminophen]; lisinopril; and losartan.  MEDICATIONS:  Current Outpatient Medications  Medication Sig Dispense Refill  . ammonium lactate (LAC-HYDRIN) 12 % lotion Apply topically as needed for dry skin. 400 g 0  . benzonatate (TESSALON) 100 MG capsule Take 1 capsule (100 mg total) by mouth 3 (three) times daily as needed for cough. (Patient not taking: Reported on 03/15/2018) 30  capsule 0  . clindamycin (CLINDAGEL) 1 % gel APPLY TO THE AFFECTED AREA(S) TWICE DAILY 30 g 1  . fluticasone (FLONASE) 50 MCG/ACT nasal spray Place 2 sprays into both nostrils daily. (Patient not taking: Reported on 03/15/2018) 16 g 1  . furosemide (LASIX) 40 MG tablet TAKE 1 TABLET BY MOUTH DAILY 90 tablet 1  . loperamide (IMODIUM) 1 MG/5ML solution Take 2 mg as needed by mouth for diarrhea or loose stools.    . magnesium oxide (MAG-OX) 400 (241.3 Mg) MG tablet Take 1 tablet (400 mg total) by mouth daily.    . methocarbamol (ROBAXIN) 500 MG tablet TAKE 1 TABLET (500 MG TOTAL) BY MOUTH EVERY 8 (EIGHT) HOURS AS NEEDED FOR MUSCLE SPASMS. 30 tablet 5  . Oxycodone HCl 10 MG TABS Take 1 tablet (10 mg total) by mouth 3 (three) times daily as needed. 60 tablet 0  . pantoprazole (PROTONIX) 40 MG tablet Take 1 tablet (40 mg total) by mouth daily at 12 noon. 90 tablet 3  . potassium chloride SA (K-DUR,KLOR-CON) 20 MEQ tablet Take 1 tablet (20 mEq total) by mouth 2 (two) times daily. 180 tablet 3  . prochlorperazine (COMPAZINE) 10 MG tablet Take 1 tablet (10 mg total) every 6 (six) hours as needed by mouth for nausea or vomiting. (Patient not taking: Reported on 03/15/2018) 30 tablet 1  . senna-docusate (SENOKOT-S) 8.6-50 MG tablet Take 1 tablet by mouth at bedtime. (Patient not taking: Reported on 03/15/2018)    . TAGRISSO 80 MG tablet TAKE 1 TABLET BY MOUTH DAILY.  30 tablet 1   No current facility-administered medications for this  visit.     SURGICAL HISTORY:  Past Surgical History:  Procedure Laterality Date  . BREAST EXCISIONAL BIOPSY Left 20+ yrs ago   benign  . PERICARDIAL WINDOW N/A 01/10/2017   Procedure: PERICARDIAL WINDOW- SUB XYPHOID, RIGHT CHEST TUBE;  Surgeon: Ivin Poot, MD;  Location: [facility];  Service: Thoracic;  Laterality: N/A;  . VIDEO BRONCHOSCOPY Bilateral 10/21/2016   Procedure: VIDEO BRONCHOSCOPY WITH FLUORO;  Surgeon: Tanda Rockers, MD;  Location: [facility];  Service:  Cardiopulmonary;  Laterality: Bilateral;    REVIEW OF SYSTEMS:  A comprehensive review of systems was negative.   PHYSICAL EXAMINATION: General appearance: alert, cooperative and no distress Head: Normocephalic, without obvious abnormality, atraumatic Neck: no adenopathy, no JVD, supple, symmetrical, trachea midline and thyroid not enlarged, symmetric, no tenderness/mass/nodules Lymph nodes: Cervical, supraclavicular, and axillary nodes normal. Resp: clear to auscultation bilaterally Back: symmetric, no curvature. ROM normal. No CVA tenderness. Cardio: regular rate and rhythm, S1, S2 normal, no murmur, click, rub or gallop GI: soft, non-tender; bowel sounds normal; no masses,  no organomegaly Extremities: extremities normal, atraumatic, no cyanosis or edema  ECOG PERFORMANCE STATUS: 0 - Asymptomatic  Blood pressure 138/79, pulse 68, temperature 98 F (36.7 C), temperature source Oral, resp. rate 18, height _0  (1.676 m), weight 241 lb 9.6 oz (109.6 kg), last menstrual period 03/15/2010, SpO2 100 %.  LABORATORY DATA: Lab Results  Component Value Date   WBC 7.5 03/08/2018   HGB 13.0 03/08/2018   HCT 42.4 03/08/2018   MCV 92.8 03/08/2018   PLT 219 03/08/2018      Chemistry      Component Value Date/Time   NA 142 03/08/2018 1402   NA 135 (L) 01/08/2017 1055   K 4.6 03/08/2018 1402   K 3.2 (L) 01/08/2017 1055   CL 105 03/08/2018 1402   CO2 29 03/08/2018 1402   CO2 25 01/08/2017 1055   BUN 13 03/08/2018 1402   BUN 14.6 01/08/2017 1055   CREATININE 1.23 (H) 03/08/2018 1402   CREATININE 1.3 (H) 01/08/2017 1055      Component Value Date/Time   CALCIUM 9.4 03/08/2018 1402   CALCIUM 9.5 01/08/2017 1055   ALKPHOS 96 03/08/2018 1402   ALKPHOS 82 01/08/2017 1055   AST 16 03/08/2018 1402   AST 31 01/08/2017 1055   ALT 10 03/08/2018 1402   ALT 34 01/08/2017 1055   BILITOT 0.4 03/08/2018 1402   BILITOT 1.74 (H) 01/08/2017 1055       RADIOGRAPHIC STUDIES: Mm 3d Screen  Breast Bilateral  Result Date: 03/29/2018 CLINICAL DATA:  Screening. EXAM: DIGITAL SCREENING BILATERAL MAMMOGRAM WITH TOMO AND CAD COMPARISON:  Previous exam(s). ACR Breast Density Category b: There are scattered areas of fibroglandular density. FINDINGS: There are no findings suspicious for malignancy. Images were processed with CAD. IMPRESSION: No mammographic evidence of malignancy. A result letter of this screening mammogram will be mailed directly to the patient. RECOMMENDATION: Screening mammogram in one year. (Code:SM-B-01Y) BI-RADS CATEGORY  1: Negative. Electronically Signed   By: Abelardo Diesel M.D.   On: 03/29/2018 08:39    ASSESSMENT AND PLAN: This is a very pleasant 53 years old African-American female with metastatic non-small cell lung cancer, adenocarcinoma and positive for EGFR mutation with deletion in exon 19.  The patient is currently on treatment with GILOTRIF 40 mg p.o. daily status post 13 months.  The patient is tolerating this treatment well except for few episodes of diarrhea and mild skin rash. She is complaining of dizzy  spells and vertigo recently. Repeat CT scan of the chest, abdomen and pelvis performed recently showed some evidence for disease progression with increase in size and number of pulmonary nodules. Her treatment was switched to Tagrisso 80 mg p.o. daily started January 01, 2018.  She is status post 3 months of treatment The patient has been tolerating this treatment well with no concerning adverse effects.  She had significant improvement in her disease seen after the second month of her treatment. I recommended for the patient to continue her current treatment with Tagrisso with the same dose. I will see her back for follow-up visit in 1 months for evaluation with repeat blood work. The patient was advised to call immediately if she has any concerning symptoms in the interval. The patient voices understanding of current disease status and treatment options and  is in agreement with the current care plan. All questions were answered. The patient knows to call the clinic with any problems, questions or concerns. We can certainly see the patient much sooner if necessary.  Disclaimer: This note was dictated with voice recognition software. Similar sounding words can inadvertently be transcribed and may not be corrected upon review.

## 2018-04-12 NOTE — Telephone Encounter (Signed)
Scheduled appt per 03/16 los.  Printed calendar and avs

## 2018-04-12 NOTE — Telephone Encounter (Signed)
Medical records faxed to [facility]; [place] [phone]

## 2018-04-27 ENCOUNTER — Encounter: Payer: Self-pay | Admitting: General Practice

## 2018-04-27 MED FILL — FUROSEMIDE 40 MG TAB: 40 | 30 days supply | Qty: 30 | Fill #1

## 2018-04-27 MED FILL — POTASSIUM CHLORIDE CRYS ER: 20 | 30 days supply | Qty: 60 | Fill #4

## 2018-04-27 MED FILL — PANTOPRAZOLE SOD DR 40 MG T: 40 | 30 days supply | Qty: 30 | Fill #4

## 2018-04-27 NOTE — Progress Notes (Signed)
[place] Team contacted patient to assess for food insecurity and other psychosocial needs during Received recent delivery of food - has eaten all of it.  Would appreciate any other help when available, but appreciative of assistance already given.  Beverely Pace, [place]

## 2018-05-12 ENCOUNTER — Telehealth: Payer: Self-pay | Admitting: Internal Medicine

## 2018-05-12 NOTE — Telephone Encounter (Signed)
Called patient regarding upcoming appointments, patient has access for Webex and e-mail has been sent.

## 2018-05-13 ENCOUNTER — Inpatient Hospital Stay (HOSPITAL_BASED_OUTPATIENT_CLINIC_OR_DEPARTMENT_OTHER): Payer: 59 | Admitting: Internal Medicine

## 2018-05-13 ENCOUNTER — Other Ambulatory Visit: Payer: Self-pay

## 2018-05-13 ENCOUNTER — Encounter: Payer: Self-pay | Admitting: Internal Medicine

## 2018-05-13 ENCOUNTER — Inpatient Hospital Stay: Payer: 59 | Attending: Oncology

## 2018-05-13 VITALS — BP 114/70 | HR 69 | Temp 97.8°F | Resp 18 | Ht 66.0 in | Wt 245.2 lb

## 2018-05-13 DIAGNOSIS — C3490 Malignant neoplasm of unspecified part of unspecified bronchus or lung: Secondary | ICD-10-CM

## 2018-05-13 DIAGNOSIS — C3492 Malignant neoplasm of unspecified part of left bronchus or lung: Secondary | ICD-10-CM | POA: Diagnosis present

## 2018-05-13 DIAGNOSIS — Z5111 Encounter for antineoplastic chemotherapy: Secondary | ICD-10-CM

## 2018-05-13 DIAGNOSIS — C7931 Secondary malignant neoplasm of brain: Secondary | ICD-10-CM | POA: Insufficient documentation

## 2018-05-13 LAB — CBC WITH DIFFERENTIAL (CANCER CENTER ONLY)
Abs Immature Granulocytes: 0.02 10*3/uL (ref 0.00–0.07)
Basophils Absolute: 0 10*3/uL (ref 0.0–0.1)
Basophils Relative: 0 %
Eosinophils Absolute: 0.2 10*3/uL (ref 0.0–0.5)
Eosinophils Relative: 3 %
HCT: 41.9 % (ref 36.0–46.0)
Hemoglobin: 12.7 g/dL (ref 12.0–15.0)
Immature Granulocytes: 0 %
Lymphocytes Relative: 22 %
Lymphs Abs: 1.7 10*3/uL (ref 0.7–4.0)
MCH: 28.7 pg (ref 26.0–34.0)
MCHC: 30.3 g/dL (ref 30.0–36.0)
MCV: 94.6 fL (ref 80.0–100.0)
Monocytes Absolute: 0.7 10*3/uL (ref 0.1–1.0)
Monocytes Relative: 9 %
Neutro Abs: 5 10*3/uL (ref 1.7–7.7)
Neutrophils Relative %: 66 %
Platelet Count: 211 10*3/uL (ref 150–400)
RBC: 4.43 MIL/uL (ref 3.87–5.11)
RDW: 14 % (ref 11.5–15.5)
WBC Count: 7.6 10*3/uL (ref 4.0–10.5)
nRBC: 0 % (ref 0.0–0.2)

## 2018-05-13 LAB — CMP (CANCER CENTER ONLY)
ALT: 12 U/L (ref 0–44)
AST: 21 U/L (ref 15–41)
Albumin: 3.6 g/dL (ref 3.5–5.0)
Alkaline Phosphatase: 104 U/L (ref 38–126)
Anion gap: 9 (ref 5–15)
BUN: 14 mg/dL (ref 6–20)
CO2: 27 mmol/L (ref 22–32)
Calcium: 9.4 mg/dL (ref 8.9–10.3)
Chloride: 105 mmol/L (ref 98–111)
Creatinine: 1.19 mg/dL — ABNORMAL HIGH (ref 0.44–1.00)
GFR, Est AFR Am: >60 mL/min (ref >60)
GFR, Estimated: 53 mL/min — ABNORMAL LOW (ref >60)
Glucose, Bld: 91 mg/dL (ref 70–99)
Potassium: 4.5 mmol/L (ref 3.5–5.1)
Sodium: 141 mmol/L (ref 135–145)
Total Bilirubin: 0.5 mg/dL (ref 0.3–1.2)
Total Protein: 7.4 g/dL (ref 6.5–8.1)

## 2018-05-13 NOTE — Progress Notes (Signed)
[facility] Telephone:(336) [phone]   Fax:(336) [phone]  OFFICE PROGRESS NOTE  [place], Gay Filler, MD [address] [place] [address]  DIAGNOSIS: DIAGNOSIS: stage IV (T3, N2, M1 a) non-small cell lung cancer, poorly differentiated adenocarcinoma with extensive miliary distribution in the lungs bilaterally diagnosed in September 2018. 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 NEGATIVE for the Exon 20 T790M mutation. Repeat molecular studies by guardant 360 at time of progression showed ATM [id number]  PRIOR THERAPY: Gilotrif 40 mg daily started 11/08/2016.Status post 13 months of treatment.  CURRENT THERAPY: Tagrisso 80 mg p.o. daily.  First dose started January 01, 2018.  Status post 4 months of treatment.  INTERVAL HISTORY: Nancy Moore 53y.o. female returns to the clinic today for follow-up visit.  The patient is feeling fine today with no concerning complaints.  She denied having any chest pain, shortness of breath, cough or hemoptysis.  She denied having any fever or chills.  She has no nausea, vomiting, diarrhea or constipation.  She has no headache or visual changes.  She is here today for evaluation and repeat blood work.  MEDICAL HISTORY: Past Medical History:  Diagnosis Date  . Adenocarcinoma of left lung, stage 4 ([facility] 10/28/2016  . Goals of care, counseling/discussion 10/28/2016  . Hypertension     ALLERGIES:  is allergic to percocet [oxycodone-acetaminophen]; lisinopril; and losartan.  MEDICATIONS:  Current Outpatient Medications  Medication Sig Dispense Refill  . ammonium lactate (LAC-HYDRIN) 12 % lotion Apply topically as needed for dry skin. 400 g 0  . benzonatate (TESSALON) 100 MG capsule Take 1 capsule (100 mg total) by mouth 3 (three) times daily as needed for cough. (Patient not taking: Reported on 03/15/2018) 30 capsule 0  . clindamycin (CLINDAGEL) 1 % gel APPLY TO THE AFFECTED AREA(S) TWICE DAILY 30 g 1  . fluticasone  (FLONASE) 50 MCG/ACT nasal spray Place 2 sprays into both nostrils daily. (Patient not taking: Reported on 03/15/2018) 16 g 1  . furosemide (LASIX) 40 MG tablet TAKE 1 TABLET BY MOUTH DAILY 90 tablet 1  . loperamide (IMODIUM) 1 MG/5ML solution Take 2 mg as needed by mouth for diarrhea or loose stools.    . magnesium oxide (MAG-OX) 400 (241.3 Mg) MG tablet Take 1 tablet (400 mg total) by mouth daily.    . methocarbamol (ROBAXIN) 500 MG tablet TAKE 1 TABLET (500 MG TOTAL) BY MOUTH EVERY 8 (EIGHT) HOURS AS NEEDED FOR MUSCLE SPASMS. 30 tablet 5  . Oxycodone HCl 10 MG TABS Take 1 tablet (10 mg total) by mouth 3 (three) times daily as needed. 60 tablet 0  . pantoprazole (PROTONIX) 40 MG tablet Take 1 tablet (40 mg total) by mouth daily at 12 noon. 90 tablet 3  . potassium chloride SA (K-DUR,KLOR-CON) 20 MEQ tablet Take 1 tablet (20 mEq total) by mouth 2 (two) times daily. 180 tablet 3  . prochlorperazine (COMPAZINE) 10 MG tablet Take 1 tablet (10 mg total) every 6 (six) hours as needed by mouth for nausea or vomiting. (Patient not taking: Reported on 03/15/2018) 30 tablet 1  . senna-docusate (SENOKOT-S) 8.6-50 MG tablet Take 1 tablet by mouth at bedtime. (Patient not taking: Reported on 03/15/2018)    . TAGRISSO 80 MG tablet TAKE 1 TABLET BY MOUTH DAILY.  30 tablet 1   No current facility-administered medications for this visit.     SURGICAL HISTORY:  Past Surgical History:  Procedure Laterality Date  . BREAST  EXCISIONAL BIOPSY Left 20+ yrs ago   benign  . PERICARDIAL WINDOW N/A 01/10/2017   Procedure: PERICARDIAL WINDOW- SUB XYPHOID, RIGHT CHEST TUBE;  Surgeon: Ivin Poot, MD;  Location: [facility];  Service: Thoracic;  Laterality: N/A;  . VIDEO BRONCHOSCOPY Bilateral 10/21/2016   Procedure: VIDEO BRONCHOSCOPY WITH FLUORO;  Surgeon: Tanda Rockers, MD;  Location: [facility];  Service: Cardiopulmonary;  Laterality: Bilateral;    REVIEW OF SYSTEMS:  A comprehensive review of systems was negative.    PHYSICAL EXAMINATION: General appearance: alert, cooperative and no distress Head: Normocephalic, without obvious abnormality, atraumatic Neck: no adenopathy, no JVD, supple, symmetrical, trachea midline and thyroid not enlarged, symmetric, no tenderness/mass/nodules Lymph nodes: Cervical, supraclavicular, and axillary nodes normal. Resp: clear to auscultation bilaterally Back: symmetric, no curvature. ROM normal. No CVA tenderness. Cardio: regular rate and rhythm, S1, S2 normal, no murmur, click, rub or gallop GI: soft, non-tender; bowel sounds normal; no masses,  no organomegaly Extremities: extremities normal, atraumatic, no cyanosis or edema  ECOG PERFORMANCE STATUS: 0 - Asymptomatic  Blood pressure 114/70, pulse 69, temperature 97.8 F (36.6 C), temperature source Oral, resp. rate 18, height '5\' 6"'  (1.676 m), weight 245 lb 3.2 oz (111.2 kg), last menstrual period 03/15/2010, SpO2 100 %.  LABORATORY DATA: Lab Results  Component Value Date   WBC 7.3 04/12/2018   HGB 13.2 04/12/2018   HCT 41.4 04/12/2018   MCV 90.8 04/12/2018   PLT 197 04/12/2018      Chemistry      Component Value Date/Time   NA 142 04/12/2018 1155   NA 135 (L) 01/08/2017 1055   K 4.2 04/12/2018 1155   K 3.2 (L) 01/08/2017 1055   CL 104 04/12/2018 1155   CO2 25 04/12/2018 1155   CO2 25 01/08/2017 1055   BUN 13 04/12/2018 1155   BUN 14.6 01/08/2017 1055   CREATININE 1.20 (H) 04/12/2018 1155   CREATININE 1.3 (H) 01/08/2017 1055      Component Value Date/Time   CALCIUM 9.5 04/12/2018 1155   CALCIUM 9.5 01/08/2017 1055   ALKPHOS 100 04/12/2018 1155   ALKPHOS 82 01/08/2017 1055   AST 18 04/12/2018 1155   AST 31 01/08/2017 1055   ALT 16 04/12/2018 1155   ALT 34 01/08/2017 1055   BILITOT 0.4 04/12/2018 1155   BILITOT 1.74 (H) 01/08/2017 1055       RADIOGRAPHIC STUDIES: No results found.  ASSESSMENT AND PLAN: This is a very pleasant 53 years old African-American female with metastatic non-small  cell lung cancer, adenocarcinoma and positive for EGFR mutation with deletion in exon 19.  The patient is currently on treatment with GILOTRIF 40 mg p.o. daily status post 13 months.  The patient is tolerating this treatment well except for few episodes of diarrhea and mild skin rash. She is complaining of dizzy spells and vertigo recently. Repeat CT scan of the chest, abdomen and pelvis performed recently showed some evidence for disease progression with increase in size and number of pulmonary nodules. Her treatment was switched to Tagrisso 80 mg p.o. daily started January 01, 2018.  She is status post 4 months of treatment The patient continues to tolerate this treatment well with no concerning adverse effects. Her lab work is unremarkable today. I recommended for her to continue her current treatment with Tagrisso. I will see her back for follow-up visit in 6 weeks for evaluation with repeat MRI of the brain as well as CT scan of the chest, abdomen and pelvis for  restaging of her disease. The patient was advised to call immediately if she has any concerning symptoms in the interval. The patient voices understanding of current disease status and treatment options and is in agreement with the current care plan. All questions were answered. The patient knows to call the clinic with any problems, questions or concerns. We can certainly see the patient much sooner if necessary.  Disclaimer: This note was dictated with voice recognition software. Similar sounding words can inadvertently be transcribed and may not be corrected upon review.

## 2018-05-14 ENCOUNTER — Telehealth: Payer: Self-pay | Admitting: Internal Medicine

## 2018-05-14 NOTE — Telephone Encounter (Signed)
Called regarding schedule °

## 2018-05-21 ENCOUNTER — Telehealth: Payer: Self-pay

## 2018-05-21 NOTE — Telephone Encounter (Signed)
Oral Oncology Patient Advocate Encounter  I received a call from the patient. She currently gets her Tagrisso with [employer] since she has been uninsured. The patient has signed up for CO[employer]RA because her medical bills will be to much for her to pay alone. She had questions about how this would effect her Tagrisso. I explained that COBRA would be considered a [employer] forces [employer] and education [employer] and we would need to get a copay card to cover the copays at the pharmacy. COBRA should go into effect within the next 24 hours. I will do a benefit investigation on Monday to determine if the patients COBRA is active and we will go from there on the copay card and getting Tagrisso filled at [facility].  The patient verbalized understanding and appreciation of the plan.  [place] Patient [place] Phone [phone] Fax [phone] 05/21/2018   9:27 AM

## 2018-05-25 MED FILL — FUROSEMIDE 40 MG TAB: 40 | 30 days supply | Qty: 30 | Fill #2

## 2018-05-25 MED FILL — POTASSIUM CHLORIDE CRYS ER: 20 | 30 days supply | Qty: 60 | Fill #5

## 2018-05-25 MED FILL — PANTOPRAZOLE SOD DR 40 MG T: 40 | 30 days supply | Qty: 30 | Fill #5

## 2018-05-25 NOTE — Telephone Encounter (Signed)
Oral Oncology Patient Advocate Encounter  Nancy Moore called back and said that the insurance will not go into effect until she makes her payment. She will call on May 10th to make that payment and let me know that the insurance is active.  I will continue to update.  [place] Patient [place] Phone [phone] Fax [phone] 05/25/2018   1:21 PM

## 2018-05-25 NOTE — Telephone Encounter (Signed)
Oral Oncology Patient Advocate Encounter  I did a benefit investigation yesterday and this morning, no insurance is currently active. I called the patient and made her aware of this. The patient will call the insurance company today and call me back with the information that she is given.  I will continue to update.  [place] Patient [place] Phone [phone] Fax [phone] 05/25/2018   9:15 AM

## 2018-06-03 ENCOUNTER — Encounter: Payer: Self-pay | Admitting: Family Medicine

## 2018-06-03 ENCOUNTER — Ambulatory Visit: Payer: Self-pay | Admitting: Family Medicine

## 2018-06-03 NOTE — Telephone Encounter (Signed)
I called her back to get more details 5:30 pm She has metastatic lung cancer treated by oncology She has some old surgical scars on her abdomen which are oozing fluid- it has a foul odor. These are incisions from years ago- no new operations or acute incision  She has noted this since yesterday She is otherwise feeling fine No fever  No cough or acute illness  I will have Mel Almond call her in the am and get her set up for an in person eval tomorrow with one of my partners - I think we need to look at this physically   I saw her for cellulitis of her abd wall in 2018, pt thinks this is likely the same sort of thing See OV 12/17/2016

## 2018-06-04 ENCOUNTER — Ambulatory Visit (INDEPENDENT_AMBULATORY_CARE_PROVIDER_SITE_OTHER): Payer: 59 | Admitting: Medical

## 2018-06-04 ENCOUNTER — Telehealth: Payer: Self-pay | Admitting: Internal Medicine

## 2018-06-04 ENCOUNTER — Ambulatory Visit: Payer: Self-pay | Admitting: Internal Medicine

## 2018-06-04 ENCOUNTER — Other Ambulatory Visit: Payer: Self-pay

## 2018-06-04 ENCOUNTER — Encounter: Payer: Self-pay | Admitting: Medical

## 2018-06-04 VITALS — BP 122/78 | HR 79 | Temp 97.9°F | Resp 16 | Ht 66.0 in | Wt 245.0 lb

## 2018-06-04 DIAGNOSIS — L089 Local infection of the skin and subcutaneous tissue, unspecified: Secondary | ICD-10-CM | POA: Diagnosis not present

## 2018-06-04 MED ORDER — DOXYCYCLINE HYCLATE 100 MG PO TABS: 100.0000 mg | ORAL_TABLET | Freq: Two times a day (BID) | ORAL | 0 refills | Status: DC

## 2018-06-04 MED ORDER — NYSTATIN 100000 UNIT/GM EX CREA: 1.0000 "application " | TOPICAL_CREAM | Freq: Two times a day (BID) | CUTANEOUS | 0 refills | Status: DC

## 2018-06-04 MED FILL — NYSTATIN 100,000 UNIT/GM CR: 100000 | 20 days supply | Qty: 30 | Fill #0

## 2018-06-04 MED FILL — DOXYCYCLINE HYCLATE 100 MG: 100 | 7 days supply | Qty: 14 | Fill #0

## 2018-06-04 NOTE — Telephone Encounter (Signed)
Moved 5/26 lab to 5/28 per sch msg. Called and left msg for patient informing of change.

## 2018-06-04 NOTE — Telephone Encounter (Signed)
Scheduled to see Nancy Moore in person at 9:40 this morning.

## 2018-06-04 NOTE — Patient Instructions (Signed)
In your lower abdomen region concern for early  fungal infection vs bacterial infection. I do think [name] to treat with topical mycostatin cream and doxycycline oral antibiotic. Rx advisement given.  If area worsens or changes let us know.  Follow up virtual visit 7 days or as needed

## 2018-06-04 NOTE — Progress Notes (Signed)
Subjective:    Patient ID: Nancy Moore, female    DOB: [birth year], 53 y.o.   MRN: [id number]  HPI  Pt in states she has red, swollen and tender area over past wee. She has area on abdomen close to prior surgical sites years ago(over 10 years ago). She states has clear discharge from region on his abdomen recently. She states it has bad odor. No fever, no sweats or chills.  No hx of skin infection.  Pt is on chemo pills. Tagrisso.    Review of Systems  Constitutional: Negative for chills, fatigue and fever.  Respiratory: Negative for chest tightness, shortness of breath and wheezing.   Cardiovascular: Negative for chest pain and palpitations.  Gastrointestinal: Negative for abdominal pain, diarrhea and rectal pain.  Musculoskeletal: Negative for back pain.  Skin: Positive for rash.       See hpi.  Hematological: Negative for adenopathy. Does not bruise/bleed easily.  Psychiatric/Behavioral: Negative for behavioral problems and confusion.   Past Medical History:  Diagnosis Date  . Adenocarcinoma of left lung, stage 4 ([facility]) 10/28/2016  . Goals of care, counseling/discussion 10/28/2016  . Hypertension      Social History   Socioeconomic History  . Marital status: Married    Spouse name: Not on file  . Number of children: Not on file  . Years of education: Not on file  . Highest education level: Not on file  Occupational History  . Not on file  Social Needs  . Financial resource strain: Not on file  . Food insecurity:    Worry: Not on file    Inability: Not on file  . Transportation needs:    Medical: Not on file    Non-medical: Not on file  Tobacco Use  . Smoking status: Never Smoker  . Smokeless tobacco: Never Used  Substance and Sexual Activity  . Alcohol use: Yes    Comment: socially  . Drug use: No  . Sexual activity: Never  Lifestyle  . Physical activity:    Days per week: Not on file    Minutes per session: Not on file  . Stress: Not on file   Relationships  . Social connections:    Talks on phone: Not on file    Gets together: Not on file    Attends religious service: Not on file    Active member of club or organization: Not on file    Attends meetings of clubs or organizations: Not on file    Relationship status: Not on file  . Intimate partner violence:    Fear of current or ex partner: Not on file    Emotionally abused: Not on file    Physically abused: Not on file    Forced sexual activity: Not on file  Other Topics Concern  . Not on file  Social History Narrative  . Not on file    Past Surgical History:  Procedure Laterality Date  . BREAST EXCISIONAL BIOPSY Left 20+ yrs ago   benign  . PERICARDIAL WINDOW N/A 01/10/2017   Procedure: PERICARDIAL WINDOW- SUB XYPHOID, RIGHT CHEST TUBE;  Surgeon: Ivin Poot, MD;  Location: [facility];  Service: Thoracic;  Laterality: N/A;  . VIDEO BRONCHOSCOPY Bilateral 10/21/2016   Procedure: VIDEO BRONCHOSCOPY WITH FLUORO;  Surgeon: Tanda Rockers, MD;  Location: [facility];  Service: Cardiopulmonary;  Laterality: Bilateral;    Family History  Problem Relation Age of Onset  . Asthma Mother   . Stroke Mother   .  Hypertension Mother   . Heart attack Father   . Hypertension Father   . Hyperlipidemia Father   . Dementia Father   . Emphysema Maternal Grandmother   . Hypertension Maternal Grandmother   . Colon cancer Paternal Grandmother   . Brain cancer Maternal Uncle     Allergies  Allergen Reactions  . Percocet [Oxycodone-Acetaminophen] Other (See Comments)    "makes me dizzy"  . Lisinopril Cough  . Losartan     Current Outpatient Medications on File Prior to Visit  Medication Sig Dispense Refill  . ammonium lactate (LAC-HYDRIN) 12 % lotion Apply topically as needed for dry skin. 400 g 0  . clindamycin (CLINDAGEL) 1 % gel APPLY TO THE AFFECTED AREA(S) TWICE DAILY 30 g 1  . furosemide (LASIX) 40 MG tablet TAKE 1 TABLET BY MOUTH DAILY 90 tablet 1  . loperamide  (IMODIUM) 1 MG/5ML solution Take 2 mg as needed by mouth for diarrhea or loose stools.    . magnesium oxide (MAG-OX) 400 (241.3 Mg) MG tablet Take 1 tablet (400 mg total) by mouth daily.    . methocarbamol (ROBAXIN) 500 MG tablet TAKE 1 TABLET (500 MG TOTAL) BY MOUTH EVERY 8 (EIGHT) HOURS AS NEEDED FOR MUSCLE SPASMS. 30 tablet 5  . Oxycodone HCl 10 MG TABS Take 1 tablet (10 mg total) by mouth 3 (three) times daily as needed. 60 tablet 0  . pantoprazole (PROTONIX) 40 MG tablet Take 1 tablet (40 mg total) by mouth daily at 12 noon. 90 tablet 3  . potassium chloride SA (K-DUR,KLOR-CON) 20 MEQ tablet Take 1 tablet (20 mEq total) by mouth 2 (two) times daily. 180 tablet 3  . TAGRISSO 80 MG tablet TAKE 1 TABLET BY MOUTH DAILY.  30 tablet 1  . prochlorperazine (COMPAZINE) 10 MG tablet Take 1 tablet (10 mg total) every 6 (six) hours as needed by mouth for nausea or vomiting. (Patient not taking: Reported on 03/15/2018) 30 tablet 1  . senna-docusate (SENOKOT-S) 8.6-50 MG tablet Take 1 tablet by mouth at bedtime. (Patient not taking: Reported on 03/15/2018)     No current facility-administered medications on file prior to visit.     BP 122/78 (BP Location: Left Arm, Patient Position: Sitting, Cuff Size: Large)   Pulse 79   Temp 97.9 F (36.6 C) (Oral)   Resp 16   Ht 5\' 6"  (1.676 m)   Wt 245 lb (111.1 kg)   LMP 03/15/2010   SpO2 99%   BMI 39.54 kg/m       Objective:   Physical Exam  General- No acute distress. Pleasant patient. Neck- Full range of motion, no jvd Lungs- Clear, even and unlabored. Heart- regular rate and rhythm. Neurologic- CNII- XII grossly intact.  Skin- rt of midline suprabic area faint pinkish area of skin and mild hyperpigmented. Faint indurated. Not warm. No dc. No breakdown of skin  Abdomen-soft, nt, nd, +bs,no rebound or guarding. No hernia.      Assessment & Plan:  In your lower abdomen region concern for early  fungal infection vs bacterial infection. I do think  [place] to treat with topical mycostatin cream and doxycycline oral antibiotic. Rx advisement given.  If area worsens or changes let us know.  Follow up virtual visit 7 days or as needed  25 minutes spent with pt. 50% of time spent counseling on treatment plan going forward and instruction going forward if area worsens or changes.

## 2018-06-08 NOTE — Telephone Encounter (Signed)
Oral Oncology Patient Advocate Encounter  I could not find active coverage for the patient. I called the patient and she stated that she is having trouble making the payment but is working on it.   The patient will call me back when she is able to make the payment and the coverage is active.  She is currently receiving her Tagrisso from [facility].  Will continue to update.  Nancy Moore Phone [phone] Fax [phone] 06/08/2018   12:22 PM

## 2018-06-09 ENCOUNTER — Ambulatory Visit
Admission: RE | Admit: 2018-06-09 | Discharge: 2018-06-09 | Disposition: A | Discharge status: Home / Self Care | Payer: 59 | Source: Ambulatory Visit | Attending: Internal Medicine | Admitting: Internal Medicine

## 2018-06-09 ENCOUNTER — Ambulatory Visit
Admission: RE | Admit: 2018-06-09 | Discharge: 2018-06-09 | Disposition: A | Discharge status: Home / Self Care | Payer: Self-pay | Source: Ambulatory Visit | Attending: Internal Medicine | Admitting: Internal Medicine

## 2018-06-09 ENCOUNTER — Other Ambulatory Visit: Payer: Self-pay

## 2018-06-09 DIAGNOSIS — C3490 Malignant neoplasm of unspecified part of unspecified bronchus or lung: Secondary | ICD-10-CM

## 2018-06-09 MED ORDER — IOPAMIDOL (ISOVUE-300) INJECTION 61%
125.0000 mL | Freq: Once | INTRAVENOUS | Status: AC | PRN
  Administered 2018-06-09: 125 mL via INTRAVENOUS

## 2018-06-09 MED ORDER — GADOBENATE DIMEGLUMINE 529 MG/ML IV SOLN
20.0000 mL | Freq: Once | INTRAVENOUS | Status: AC | PRN
  Administered 2018-06-09: 20 mL via INTRAVENOUS

## 2018-06-10 NOTE — Telephone Encounter (Signed)
Oral Oncology Patient Advocate Encounter  The patient called me to give me her new prescription insurance information. It is currently active however, Tagrisso is required to be filled with [facility].  I told her we would get the prescription sent to [facility] and I would follow up with them to check on copay information.  Tagrisso is currently being filled with [employer], I will wait to make sure everything goes through with [facility] before I call and cancel [employer].  I will update the patient when I have more information for her. She verbalized understanding and great appreciation.  [place] Patient [place] Phone [phone] Fax [phone] 06/10/2018   4:03 PM

## 2018-06-11 ENCOUNTER — Ambulatory Visit (INDEPENDENT_AMBULATORY_CARE_PROVIDER_SITE_OTHER): Payer: 59 | Admitting: Medical

## 2018-06-11 ENCOUNTER — Encounter: Payer: Self-pay | Admitting: Medical

## 2018-06-11 ENCOUNTER — Other Ambulatory Visit: Payer: Self-pay

## 2018-06-11 ENCOUNTER — Telehealth: Payer: Self-pay | Admitting: Pharmacist

## 2018-06-11 DIAGNOSIS — C3490 Malignant neoplasm of unspecified part of unspecified bronchus or lung: Secondary | ICD-10-CM

## 2018-06-11 DIAGNOSIS — L089 Local infection of the skin and subcutaneous tissue, unspecified: Secondary | ICD-10-CM | POA: Diagnosis not present

## 2018-06-11 MED ORDER — OSIMERTINIB MESYLATE 80 MG PO TABS: 80.0000 mg | ORAL_TABLET | Freq: Every day | ORAL | 2 refills | Status: DC

## 2018-06-11 NOTE — Progress Notes (Signed)
   Subjective:    Patient ID: Nancy Moore, female    DOB: [birth year], 53 y.o.   MRN: [id number]  HPI  Virtual Visit via Video Note  I connected with Nancy Moore on 06/11/18 at 11:00 AM EDT by a video enabled telemedicine application and verified that I am speaking with the correct person using two identifiers.  Location: Patient: standing outside her car. Provider: home     I discussed the limitations of evaluation and management by telemedicine and the availability of in person appointments. The patient expressed understanding and agreed to proceed.   History of Present Illness: Pt states she is gradually feeling better since use of antibiotic. After 2-3 days of antibiotic and mycostatin area was getting less red, tender, and less indurated. Smell and dc resolved. Now 85% better per pt.  Observations/Objective: None since was phone call.She could no do video today.  Assessment and Plan: Patient's former area right of midline suprapubic area is now reported to be about 80 to 85% better.  She states no further discharge, induration, foul smell in the area does not hurt on palpation.  She has about 3 or 4 days left of the antibiotic. Explained at this point continue antibiotic until completed. Expect she will be 100% better at end of course.  Also continue mycostatin  Follow up only if needed for recent infection but keep regular appointments with pcp.  10 minutes pent with pt on phone today.   Follow Up Instructions:    I discussed the assessment and treatment plan with the patient. The patient was provided an opportunity to ask questions and all were answered. The patient agreed with the plan and demonstrated an understanding of the instructions.   The patient was advised to call back or seek an in-person evaluation if the symptoms worsen or if the condition fails to improve as anticipated.     Mackie Pai, PA-C   Review of Systems     Objective:   Physical  Exam        Assessment & Plan:

## 2018-06-11 NOTE — Telephone Encounter (Signed)
Oral Oncology Pharmacist Encounter  Patient has been working with oral oncology patient advocate now that her prescription insurance is reinstated. Tagrisso must be filled at [facility] per insurance requirement. Prescription for Tagrisso 80 mg tablets, take 1 tablet by mouth once daily, quantity #30, refills = 2, has been E scribed to [name], [place] location.  Contact at dispensing pharmacy has been notified about incoming prescription. Supporting information including insurance information, demographic information, medication list, copayment grant information, and insurance authorization has been faxed to dispensing pharmacy.   We will follow-up with patient about site of dispensing once we confirm pharmacy is able to process patient's claim.  Johny Drilling, PharmD, BCPS, BCOP  06/11/2018 8:51 AM Oral Oncology Clinic [phone]

## 2018-06-11 NOTE — Patient Instructions (Signed)
Patient's former area right of midline suprapubic area is now reported to be about 80 to 85% better.  She states no further discharge, induration, foul smell in the area does not hurt on palpation.  She has about 3 or 4 days left of the antibiotic. Explained at this point continue antibiotic until completed. Expect she will be 100% better at end of course.  Also continue mycostatin  Follow up only if needed for recent infection but keep regular appointments with pcp.  10 minutes pent with pt on phone today.

## 2018-06-14 ENCOUNTER — Ambulatory Visit: Payer: Self-pay | Admitting: Family Medicine

## 2018-06-14 NOTE — Telephone Encounter (Signed)
Oral Oncology Patient Advocate Encounter  I called to follow up with [facility]. They have the prescription ready to be filled for a $0 copay. They need the patient to call the schedule the refill when she is ready [phone].  I will call [employer] and cancel her manufacturer assistance.  I called the patient to give her this information and had to leave a voicemail.  [place] Patient [place] Phone [phone] Fax [phone] 06/14/2018   3:12 PM

## 2018-06-22 ENCOUNTER — Other Ambulatory Visit: Payer: Self-pay

## 2018-06-24 ENCOUNTER — Inpatient Hospital Stay: Payer: 59

## 2018-06-24 ENCOUNTER — Encounter: Payer: Self-pay | Admitting: Internal Medicine

## 2018-06-24 ENCOUNTER — Other Ambulatory Visit: Payer: Self-pay

## 2018-06-24 ENCOUNTER — Inpatient Hospital Stay: Payer: 59 | Attending: Oncology | Admitting: Internal Medicine

## 2018-06-24 VITALS — BP 132/64 | HR 72 | Temp 98.0°F | Resp 18 | Ht 66.0 in | Wt 246.8 lb

## 2018-06-24 DIAGNOSIS — C7931 Secondary malignant neoplasm of brain: Secondary | ICD-10-CM | POA: Insufficient documentation

## 2018-06-24 DIAGNOSIS — I10 Essential (primary) hypertension: Secondary | ICD-10-CM | POA: Insufficient documentation

## 2018-06-24 DIAGNOSIS — Z5111 Encounter for antineoplastic chemotherapy: Secondary | ICD-10-CM

## 2018-06-24 DIAGNOSIS — Z79899 Other long term (current) drug therapy: Secondary | ICD-10-CM | POA: Diagnosis not present

## 2018-06-24 DIAGNOSIS — C3490 Malignant neoplasm of unspecified part of unspecified bronchus or lung: Secondary | ICD-10-CM

## 2018-06-24 DIAGNOSIS — C3492 Malignant neoplasm of unspecified part of left bronchus or lung: Secondary | ICD-10-CM | POA: Diagnosis present

## 2018-06-24 LAB — CBC WITH DIFFERENTIAL (CANCER CENTER ONLY)
Abs Immature Granulocytes: 0.01 10*3/uL (ref 0.00–0.07)
Basophils Absolute: 0 10*3/uL (ref 0.0–0.1)
Basophils Relative: 0 %
Eosinophils Absolute: 0.2 10*3/uL (ref 0.0–0.5)
Eosinophils Relative: 2 %
HCT: 41 % (ref 36.0–46.0)
Hemoglobin: 12.6 g/dL (ref 12.0–15.0)
Immature Granulocytes: 0 %
Lymphocytes Relative: 25 %
Lymphs Abs: 2 10*3/uL (ref 0.7–4.0)
MCH: 28.4 pg (ref 26.0–34.0)
MCHC: 30.7 g/dL (ref 30.0–36.0)
MCV: 92.6 fL (ref 80.0–100.0)
Monocytes Absolute: 0.7 10*3/uL (ref 0.1–1.0)
Monocytes Relative: 8 %
Neutro Abs: 5.2 10*3/uL (ref 1.7–7.7)
Neutrophils Relative %: 65 %
Platelet Count: 219 10*3/uL (ref 150–400)
RBC: 4.43 MIL/uL (ref 3.87–5.11)
RDW: 14.2 % (ref 11.5–15.5)
WBC Count: 8.1 10*3/uL (ref 4.0–10.5)
nRBC: 0 % (ref 0.0–0.2)

## 2018-06-24 LAB — CMP (CANCER CENTER ONLY)
ALT: 15 U/L (ref 0–44)
AST: 20 U/L (ref 15–41)
Albumin: 3.7 g/dL (ref 3.5–5.0)
Alkaline Phosphatase: 95 U/L (ref 38–126)
Anion gap: 7 (ref 5–15)
BUN: 13 mg/dL (ref 6–20)
CO2: 29 mmol/L (ref 22–32)
Calcium: 9.3 mg/dL (ref 8.9–10.3)
Chloride: 104 mmol/L (ref 98–111)
Creatinine: 1.21 mg/dL — ABNORMAL HIGH (ref 0.44–1.00)
GFR, Est AFR Am: 60 mL/min — ABNORMAL LOW (ref >60)
GFR, Estimated: 52 mL/min — ABNORMAL LOW (ref >60)
Glucose, Bld: 92 mg/dL (ref 70–99)
Potassium: 4.2 mmol/L (ref 3.5–5.1)
Sodium: 140 mmol/L (ref 135–145)
Total Bilirubin: 0.4 mg/dL (ref 0.3–1.2)
Total Protein: 7.3 g/dL (ref 6.5–8.1)

## 2018-06-24 MED FILL — CLINDAMYCIN PH 1% GEL: 1 | 20 days supply | Qty: 30 | Fill #1

## 2018-06-24 MED FILL — PANTOPRAZOLE SOD DR 40 MG T: 40 | 30 days supply | Qty: 30 | Fill #6

## 2018-06-24 MED FILL — FUROSEMIDE 40 MG TAB: 40 | 30 days supply | Qty: 30 | Fill #3

## 2018-06-24 MED FILL — POTASSIUM CHLORIDE CRYS ER: 20 | 30 days supply | Qty: 60 | Fill #6

## 2018-06-24 NOTE — Progress Notes (Signed)
[facility] [phone]) [phone]   Fax:(336) [phone]  OFFICE PROGRESS NOTE  Copland, Gay Filler, MD [address] [place] [address]  DIAGNOSIS: DIAGNOSIS: stage IV (T3, N2, M1 a) non-small cell lung cancer, poorly differentiated adenocarcinoma with extensive miliary distribution in the lungs bilaterally diagnosed in September 2018. 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 NEGATIVE for the Exon 20 T790M mutation. Repeat molecular studies by guardant 360 at time of progression showed ATM [id number]  PRIOR THERAPY: Gilotrif 40 mg daily started 11/08/2016.Status post 13 months of treatment.  CURRENT THERAPY: Tagrisso 80 mg p.o. daily.  First dose started January 01, 2018.  Status post 6 months of treatment.  INTERVAL HISTORY: Nancy Moore 53y.o. female returns to the clinic today for follow-up visit.  The patient treatment time today with no concerning complaints.  She denied having any chest pain, shortness of breath, cough or hemoptysis.  She denied having any fever or chills.  She has no nausea, vomiting, diarrhea or constipation.  She has no skin rash.  She denied having any weight loss or night sweats.  She has no headache or visual changes.  The patient has been tolerating her treatment with Tagrisso fairly well.  She had repeat CT scan of the chest, abdomen and pelvis as well as MRI of the brain performed recently and she is here for evaluation and discussion of her scan results.  MEDICAL HISTORY: Past Medical History:  Diagnosis Date   Adenocarcinoma of left lung, stage 4 ([facility] 10/28/2016   Goals of care, counseling/discussion 10/28/2016   Hypertension     ALLERGIES:  is allergic to percocet [oxycodone-acetaminophen]; lisinopril; and losartan.  MEDICATIONS:  Current Outpatient Medications  Medication Sig Dispense Refill   ammonium lactate (LAC-HYDRIN) 12 % lotion Apply topically as needed for dry skin. 400 g 0   clindamycin  (CLINDAGEL) 1 % gel APPLY TO THE AFFECTED AREA(S) TWICE DAILY 30 g 1   doxycycline (VIBRA-TABS) 100 MG tablet Take 1 tablet (100 mg total) by mouth 2 (two) times daily. Can give caps or generic 14 tablet 0   furosemide (LASIX) 40 MG tablet TAKE 1 TABLET BY MOUTH DAILY 90 tablet 1   loperamide (IMODIUM) 1 MG/5ML solution Take 2 mg as needed by mouth for diarrhea or loose stools.     magnesium oxide (MAG-OX) 400 (241.3 Mg) MG tablet Take 1 tablet (400 mg total) by mouth daily.     methocarbamol (ROBAXIN) 500 MG tablet TAKE 1 TABLET (500 MG TOTAL) BY MOUTH EVERY 8 (EIGHT) HOURS AS NEEDED FOR MUSCLE SPASMS. 30 tablet 5   nystatin cream (MYCOSTATIN) Apply 1 application topically 2 (two) times daily. 30 g 0   osimertinib mesylate (TAGRISSO) 80 MG tablet Take 1 tablet (80 mg total) by mouth daily. 30 tablet 2   Oxycodone HCl 10 MG TABS Take 1 tablet (10 mg total) by mouth 3 (three) times daily as needed. 60 tablet 0   pantoprazole (PROTONIX) 40 MG tablet Take 1 tablet (40 mg total) by mouth daily at 12 noon. 90 tablet 3   potassium chloride SA (K-DUR,KLOR-CON) 20 MEQ tablet Take 1 tablet (20 mEq total) by mouth 2 (two) times daily. 180 tablet 3   prochlorperazine (COMPAZINE) 10 MG tablet Take 1 tablet (10 mg total) every 6 (six) hours as needed by mouth for nausea or vomiting. (Patient not taking: Reported on 03/15/2018) 30 tablet 1   senna-docusate (SENOKOT-S) 8.6-50 MG tablet Take  1 tablet by mouth at bedtime. (Patient not taking: Reported on 03/15/2018)     No current facility-administered medications for this visit.     SURGICAL HISTORY:  Past Surgical History:  Procedure Laterality Date   BREAST EXCISIONAL BIOPSY Left 20+ yrs ago   benign   PERICARDIAL WINDOW N/A 01/10/2017   Procedure: PERICARDIAL WINDOW- SUB XYPHOID, RIGHT CHEST TUBE;  Surgeon: Ivin Poot, MD;  Location: [facility];  Service: Thoracic;  Laterality: N/A;   VIDEO BRONCHOSCOPY Bilateral 10/21/2016   Procedure:  VIDEO BRONCHOSCOPY WITH FLUORO;  Surgeon: Tanda Rockers, MD;  Location: [facility];  Service: Cardiopulmonary;  Laterality: Bilateral;    REVIEW OF SYSTEMS:  Constitutional: negative Eyes: negative Ears, nose, mouth, throat, and face: negative Respiratory: negative Cardiovascular: negative Gastrointestinal: negative Genitourinary:negative Integument/breast: negative Hematologic/lymphatic: negative Musculoskeletal:negative Neurological: negative Behavioral/Psych: negative Endocrine: negative Allergic/Immunologic: negative   PHYSICAL EXAMINATION: General appearance: alert, cooperative and no distress Head: Normocephalic, without obvious abnormality, atraumatic Neck: no adenopathy, no JVD, supple, symmetrical, trachea midline and thyroid not enlarged, symmetric, no tenderness/mass/nodules Lymph nodes: Cervical, supraclavicular, and axillary nodes normal. Resp: clear to auscultation bilaterally Back: symmetric, no curvature. ROM normal. No CVA tenderness. Cardio: regular rate and rhythm, S1, S2 normal, no murmur, click, rub or gallop GI: soft, non-tender; bowel sounds normal; no masses,  no organomegaly Extremities: extremities normal, atraumatic, no cyanosis or edema Neurologic: Alert and oriented X 3, normal strength and tone. Normal symmetric reflexes. Normal coordination and gait  ECOG PERFORMANCE STATUS: 0 - Asymptomatic  Blood pressure 132/64, pulse 72, temperature 98 F (36.7 C), temperature source Oral, resp. rate 18, height 5' 6"  (1.676 m), weight 246 lb 12.8 oz (111.9 kg), last menstrual period 03/15/2010, SpO2 100 %.  LABORATORY DATA: Lab Results  Component Value Date   WBC 8.1 06/24/2018   HGB 12.6 06/24/2018   HCT 41.0 06/24/2018   MCV 92.6 06/24/2018   PLT 219 06/24/2018      Chemistry      Component Value Date/Time   NA 141 05/13/2018 0933   NA 135 (L) 01/08/2017 1055   K 4.5 05/13/2018 0933   K 3.2 (L) 01/08/2017 1055   CL 105 05/13/2018 0933   CO2  27 05/13/2018 0933   CO2 25 01/08/2017 1055   BUN 14 05/13/2018 0933   BUN 14.6 01/08/2017 1055   CREATININE 1.19 (H) 05/13/2018 0933   CREATININE 1.3 (H) 01/08/2017 1055      Component Value Date/Time   CALCIUM 9.4 05/13/2018 0933   CALCIUM 9.5 01/08/2017 1055   ALKPHOS 104 05/13/2018 0933   ALKPHOS 82 01/08/2017 1055   AST 21 05/13/2018 0933   AST 31 01/08/2017 1055   ALT 12 05/13/2018 0933   ALT 34 01/08/2017 1055   BILITOT 0.5 05/13/2018 0933   BILITOT 1.74 (H) 01/08/2017 1055       RADIOGRAPHIC STUDIES: Ct Chest W Contrast  Result Date: 06/10/2018 CLINICAL DATA:  Lung cancer/brain cancer, for restaging EXAM: CT CHEST, ABDOMEN, AND PELVIS WITH CONTRAST TECHNIQUE: Multidetector CT imaging of the chest, abdomen and pelvis was performed following the standard protocol during bolus administration of intravenous contrast. CONTRAST:  136m ISOVUE-300 IOPAMIDOL (ISOVUE-300) INJECTION 61% COMPARISON:  03/08/2018 FINDINGS: CT CHEST FINDINGS Cardiovascular: The heart is normal in size. No pericardial effusion. No evidence of thoracic aortic aneurysm. Mediastinum/Nodes: No suspicious mediastinal lymphadenopathy. Visualized thyroid is unremarkable. Lungs/Pleura: Coarse interstitial markings with interlobular ground-glass opacity and micro nodularity, upper lung predominant. Additional bilateral pulmonary nodules, overall grossly unchanged,  including: --11 x 10 mm nodule in the superior segment right lower lobe (series 5/image 52), previously 12 x 12 mm --10 x 10 mm nodule in the superior segment left lower lobe (series 5/image 83), previously 12 x 9 mm, with surrounding platelike opacity which may reflect radiation changes (series 5/image 84) --9 x 8 mm nodule in the left lower lobe (series 5/image 34), previously 7 x 8 mm No pleural effusion or pneumothorax. Musculoskeletal: Visualized osseous structures are within normal limits. CT ABDOMEN PELVIS FINDINGS Hepatobiliary: Liver is within normal  limits. No suspicious enhancing hepatic lesions. Gallbladder is unremarkable. No intrahepatic or extrahepatic ductal dilatation. Pancreas: Within normal limits. Spleen: Within normal limits. Adrenals/Urinary Tract: Adrenal glands are within normal limits. Kidneys are within normal limits.  No hydronephrosis. Bladder is within normal limits. Stomach/Bowel: Stomach is within normal limits. No evidence of bowel obstruction. Normal appendix (series 2/image 88). No colonic wall thickening or mass is seen. Vascular/Lymphatic: No evidence of abdominal aortic aneurysm. Atherosclerotic calcifications of the abdominal aorta and branch vessels. No suspicious abdominopelvic lymphadenopathy. Reproductive: Status post hysterectomy. No adnexal masses. Other: No abdominopelvic ascites. Tiny fat containing periumbilical hernia. Musculoskeletal: Visualized osseous structures are within normal limits. IMPRESSION: Stable parenchymal opacities with ground-glass opacity and micro and macro nodularity in the lungs bilaterally, as described above. Overall appearance is grossly unchanged with mild waxing/waning of some of the dominant nodules. No evidence of metastatic disease in the abdomen/pelvis. Electronically Signed   By: Julian Hy M.D.   On: 06/10/2018 11:11   Mr Jeri Cos EL Contrast  Result Date: 06/10/2018 CLINICAL DATA:  Non-small-cell lung cancer metastatic to brain. Creatinine was obtained on site at [facility] at [address] Results: Creatinine 1.3 mg/dL. EXAM: MRI HEAD WITHOUT AND WITH CONTRAST TECHNIQUE: Multiplanar, multiecho pulse sequences of the brain and surrounding structures were obtained without and with intravenous contrast. CONTRAST:  101m MULTIHANCE GADOBENATE DIMEGLUMINE 529 MG/ML IV SOLN COMPARISON:  MRI head 03/02/2018, 01/05/2018 FINDINGS: Brain: Previously noted metastatic deposits throughout the brain have nearly completely resolved. No edema. Small enhancing nodule head of caudate  on the left has improved. Other lesions are no longer visible. Chronic blood products are present and multiple small cortical lesions unchanged from the recent study. Ventricle size normal. No acute infarct. No significant white matter changes. Vascular: Normal arterial flow voids Skull and upper cervical spine: No skull lesions identified. Sinuses/Orbits: Negative Other: None IMPRESSION: Continued positive response to treatment. No new lesions. Previously noted metastatic deposits have nearly completely resolved. Many of these have mild chronic hemosiderin from prior tumor hemorrhage. Electronically Signed   By: CFranchot GalloM.D.   On: 06/10/2018 09:02   Ct Abdomen Pelvis W Contrast  Result Date: 06/10/2018 CLINICAL DATA:  Lung cancer/brain cancer, for restaging EXAM: CT CHEST, ABDOMEN, AND PELVIS WITH CONTRAST TECHNIQUE: Multidetector CT imaging of the chest, abdomen and pelvis was performed following the standard protocol during bolus administration of intravenous contrast. CONTRAST:  12105mISOVUE-300 IOPAMIDOL (ISOVUE-300) INJECTION 61% COMPARISON:  03/08/2018 FINDINGS: CT CHEST FINDINGS Cardiovascular: The heart is normal in size. No pericardial effusion. No evidence of thoracic aortic aneurysm. Mediastinum/Nodes: No suspicious mediastinal lymphadenopathy. Visualized thyroid is unremarkable. Lungs/Pleura: Coarse interstitial markings with interlobular ground-glass opacity and micro nodularity, upper lung predominant. Additional bilateral pulmonary nodules, overall grossly unchanged, including: --11 x 10 mm nodule in the superior segment right lower lobe (series 5/image 52), previously 12 x 12 mm --10 x 10 mm nodule in the superior segment left lower  lobe (series 5/image 83), previously 12 x 9 mm, with surrounding platelike opacity which may reflect radiation changes (series 5/image 84) --9 x 8 mm nodule in the left lower lobe (series 5/image 34), previously 7 x 8 mm No pleural effusion or pneumothorax.  Musculoskeletal: Visualized osseous structures are within normal limits. CT ABDOMEN PELVIS FINDINGS Hepatobiliary: Liver is within normal limits. No suspicious enhancing hepatic lesions. Gallbladder is unremarkable. No intrahepatic or extrahepatic ductal dilatation. Pancreas: Within normal limits. Spleen: Within normal limits. Adrenals/Urinary Tract: Adrenal glands are within normal limits. Kidneys are within normal limits.  No hydronephrosis. Bladder is within normal limits. Stomach/Bowel: Stomach is within normal limits. No evidence of bowel obstruction. Normal appendix (series 2/image 88). No colonic wall thickening or mass is seen. Vascular/Lymphatic: No evidence of abdominal aortic aneurysm. Atherosclerotic calcifications of the abdominal aorta and branch vessels. No suspicious abdominopelvic lymphadenopathy. Reproductive: Status post hysterectomy. No adnexal masses. Other: No abdominopelvic ascites. Tiny fat containing periumbilical hernia. Musculoskeletal: Visualized osseous structures are within normal limits. IMPRESSION: Stable parenchymal opacities with ground-glass opacity and micro and macro nodularity in the lungs bilaterally, as described above. Overall appearance is grossly unchanged with mild waxing/waning of some of the dominant nodules. No evidence of metastatic disease in the abdomen/pelvis. Electronically Signed   By: Julian Hy M.D.   On: 06/10/2018 11:11    ASSESSMENT AND PLAN: This is a very pleasant 53 years old African-American female with metastatic non-small cell lung cancer, adenocarcinoma and positive for EGFR mutation with deletion in exon 19.  The patient is currently on treatment with GILOTRIF 40 mg p.o. daily status post 13 months.  The patient is tolerating this treatment well except for few episodes of diarrhea and mild skin rash. She is complaining of dizzy spells and vertigo recently. Repeat CT scan of the chest, abdomen and pelvis performed recently showed some  evidence for disease progression with increase in size and number of pulmonary nodules. Her treatment was switched to Tagrisso 80 mg p.o. daily started January 01, 2018.  She is status post 6 months of treatment The patient has been tolerating the treatment well with no concerning adverse effects. He had repeat CT scan of the chest, abdomen and pelvis as well as MRI of the brain performed recently.  I personally and independently reviewed the scans and discussed the result with the patient today. Her scan showed no concerning findings for disease progression and there was improvement in her disease in the brain. I recommended for the patient to continue her current treatment with the treatment with the same dose. I will see her back for follow-up visit in 2 months for evaluation and repeat blood work. She was advised to call immediately if she has any symptoms in the interval. The patient voices understanding of current disease status and treatment options and is in agreement with the current care plan. All questions were answered. The patient knows to call the clinic with any problems, questions or concerns. We can certainly see the patient much sooner if necessary.  Disclaimer: This note was dictated with voice recognition software. Similar sounding words can inadvertently be transcribed and may not be corrected upon review.

## 2018-06-25 ENCOUNTER — Telehealth: Payer: Self-pay | Admitting: Internal Medicine

## 2018-06-25 NOTE — Telephone Encounter (Signed)
Scheduled appt per 5/28 los - unable to reach patient . Left message with appt date and time

## 2018-07-27 ENCOUNTER — Other Ambulatory Visit: Payer: Self-pay | Admitting: Family Medicine

## 2018-07-27 DIAGNOSIS — C3492 Malignant neoplasm of unspecified part of left bronchus or lung: Secondary | ICD-10-CM

## 2018-07-27 DIAGNOSIS — R52 Pain, unspecified: Secondary | ICD-10-CM

## 2018-07-27 DIAGNOSIS — L853 Xerosis cutis: Secondary | ICD-10-CM

## 2018-07-27 MED FILL — CLINDAMYCIN PH 1% GEL: 1 | 20 days supply | Qty: 30 | Fill #0

## 2018-07-27 MED FILL — POTASSIUM CHLORIDE CRYS ER: 20 | 30 days supply | Qty: 60 | Fill #7

## 2018-07-27 MED FILL — oxyCODONE HCL 10 MG TABS: 10 | 20 days supply | Qty: 60 | Fill #0

## 2018-07-27 MED FILL — METHOCARBAMOL 500 MG TABS: 500 | 10 days supply | Qty: 30 | Fill #3

## 2018-07-27 MED FILL — PANTOPRAZOLE SOD DR 40 MG T: 40 | 30 days supply | Qty: 30 | Fill #7

## 2018-07-27 MED FILL — FUROSEMIDE 40 MG TAB: 40 | 30 days supply | Qty: 30 | Fill #4

## 2018-08-24 ENCOUNTER — Other Ambulatory Visit: Payer: Self-pay

## 2018-08-24 ENCOUNTER — Encounter: Payer: Self-pay | Admitting: Internal Medicine

## 2018-08-24 ENCOUNTER — Inpatient Hospital Stay: Payer: 59

## 2018-08-24 ENCOUNTER — Telehealth: Payer: Self-pay | Admitting: Internal Medicine

## 2018-08-24 ENCOUNTER — Inpatient Hospital Stay: Payer: 59 | Attending: Oncology | Admitting: Internal Medicine

## 2018-08-24 VITALS — BP 109/79 | HR 79 | Temp 98.3°F | Resp 18 | Ht 66.0 in | Wt 244.9 lb

## 2018-08-24 DIAGNOSIS — C7931 Secondary malignant neoplasm of brain: Secondary | ICD-10-CM

## 2018-08-24 DIAGNOSIS — C3492 Malignant neoplasm of unspecified part of left bronchus or lung: Secondary | ICD-10-CM

## 2018-08-24 DIAGNOSIS — I10 Essential (primary) hypertension: Secondary | ICD-10-CM | POA: Insufficient documentation

## 2018-08-24 DIAGNOSIS — C3490 Malignant neoplasm of unspecified part of unspecified bronchus or lung: Secondary | ICD-10-CM

## 2018-08-24 DIAGNOSIS — Z79899 Other long term (current) drug therapy: Secondary | ICD-10-CM | POA: Insufficient documentation

## 2018-08-24 DIAGNOSIS — Z5111 Encounter for antineoplastic chemotherapy: Secondary | ICD-10-CM

## 2018-08-24 LAB — CBC WITH DIFFERENTIAL (CANCER CENTER ONLY)
Abs Immature Granulocytes: 0.05 10*3/uL (ref 0.00–0.07)
Basophils Absolute: 0.1 10*3/uL (ref 0.0–0.1)
Basophils Relative: 1 %
Eosinophils Absolute: 0.3 10*3/uL (ref 0.0–0.5)
Eosinophils Relative: 3 %
HCT: 42.3 % (ref 36.0–46.0)
Hemoglobin: 13.3 g/dL (ref 12.0–15.0)
Immature Granulocytes: 1 %
Lymphocytes Relative: 26 %
Lymphs Abs: 2.5 10*3/uL (ref 0.7–4.0)
MCH: 28.9 pg (ref 26.0–34.0)
MCHC: 31.4 g/dL (ref 30.0–36.0)
MCV: 92 fL (ref 80.0–100.0)
Monocytes Absolute: 0.8 10*3/uL (ref 0.1–1.0)
Monocytes Relative: 8 %
Neutro Abs: 6 10*3/uL (ref 1.7–7.7)
Neutrophils Relative %: 61 %
Platelet Count: 226 10*3/uL (ref 150–400)
RBC: 4.6 MIL/uL (ref 3.87–5.11)
RDW: 14.1 % (ref 11.5–15.5)
WBC Count: 9.8 10*3/uL (ref 4.0–10.5)
nRBC: 0 % (ref 0.0–0.2)

## 2018-08-24 LAB — CMP (CANCER CENTER ONLY)
ALT: 12 U/L (ref 0–44)
AST: 20 U/L (ref 15–41)
Albumin: 3.7 g/dL (ref 3.5–5.0)
Alkaline Phosphatase: 111 U/L (ref 38–126)
Anion gap: 8 (ref 5–15)
BUN: 13 mg/dL (ref 6–20)
CO2: 27 mmol/L (ref 22–32)
Calcium: 8.7 mg/dL — ABNORMAL LOW (ref 8.9–10.3)
Chloride: 105 mmol/L (ref 98–111)
Creatinine: 1.31 mg/dL — ABNORMAL HIGH (ref 0.44–1.00)
GFR, Est AFR Am: 54 mL/min — ABNORMAL LOW (ref >60)
GFR, Estimated: 47 mL/min — ABNORMAL LOW (ref >60)
Glucose, Bld: 87 mg/dL (ref 70–99)
Potassium: 4.7 mmol/L (ref 3.5–5.1)
Sodium: 140 mmol/L (ref 135–145)
Total Bilirubin: 0.5 mg/dL (ref 0.3–1.2)
Total Protein: 7.5 g/dL (ref 6.5–8.1)

## 2018-08-24 NOTE — Progress Notes (Signed)
[facility] Telephone:([phone]   [phone]  OFFICE PROGRESS NOTE  Copland, Gay Filler, MD [address] [place] [address]  DIAGNOSIS: DIAGNOSIS: stage IV (T3, N2, M1 a) non-small cell lung cancer, poorly differentiated adenocarcinoma with extensive miliary distribution in the lungs bilaterally diagnosed in September 2018. 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 NEGATIVE for the Exon 20 T790M mutation. Repeat molecular studies by guardant 360 at time of progression showed ATM [id number]  PRIOR THERAPY: Gilotrif 40 mg daily started 11/08/2016.Status post 13 months of treatment.  CURRENT THERAPY: Tagrisso 80 mg p.o. daily.  First dose started January 01, 2018.  Status post 7 months of treatment.  INTERVAL HISTORY: [name] 53y.o. female returns to the clinic today for follow-up visit.  The patient is feeling fine today with no concerning complaints.  She had one episode of dizzy spells a week ago but that completely resolved on its own.  She denied having any current chest pain, shortness of breath, cough or hemoptysis.  She denied having any nausea, vomiting, diarrhea or constipation.  She has no skin rash.  She has no recent weight loss or night sweats.  She continues to tolerate her treatment with Tagrisso fairly well.  The patient is here today for evaluation and repeat blood work.   MEDICAL HISTORY: Past Medical History:  Diagnosis Date  . Adenocarcinoma of left lung, stage 4 ([facility] 10/28/2016  . Goals of care, counseling/discussion 10/28/2016  . Hypertension     ALLERGIES:  is allergic to percocet [oxycodone-acetaminophen]; lisinopril; and losartan.  MEDICATIONS:  Current Outpatient Medications  Medication Sig Dispense Refill  . ammonium lactate (LAC-HYDRIN) 12 % lotion Apply topically as needed for dry skin. 400 g 0  . clindamycin (CLINDAGEL) 1 % gel APPLY TO THE AFFECTED AREA(S) TWICE DAILY 30 g 1  . fluticasone  (FLONASE) 50 MCG/ACT nasal spray Place 2 sprays into both nostrils daily.    . furosemide (LASIX) 40 MG tablet TAKE 1 TABLET BY MOUTH DAILY 90 tablet 1  . loperamide (IMODIUM) 1 MG/5ML solution Take 2 mg as needed by mouth for diarrhea or loose stools.    . magnesium oxide (MAG-OX) 400 (241.3 Mg) MG tablet Take 1 tablet (400 mg total) by mouth daily.    . methocarbamol (ROBAXIN) 500 MG tablet TAKE 1 TABLET (500 MG TOTAL) BY MOUTH EVERY 8 (EIGHT) HOURS AS NEEDED FOR MUSCLE SPASMS. 30 tablet 5  . nystatin cream (MYCOSTATIN) Apply 1 application topically 2 (two) times daily. 30 g 0  . osimertinib mesylate (TAGRISSO) 80 MG tablet Take 1 tablet (80 mg total) by mouth daily. 30 tablet 2  . Oxycodone HCl 10 MG TABS TAKE 1 TABLET (10 MG TOTAL) BY MOUTH 3 (THREE) TIMES DAILY AS NEEDED. 60 tablet 0  . pantoprazole (PROTONIX) 40 MG tablet Take 1 tablet (40 mg total) by mouth daily at 12 noon. 90 tablet 3  . potassium chloride SA (K-DUR,KLOR-CON) 20 MEQ tablet Take 1 tablet (20 mEq total) by mouth 2 (two) times daily. 180 tablet 3  . prochlorperazine (COMPAZINE) 10 MG tablet Take 1 tablet (10 mg total) every 6 (six) hours as needed by mouth for nausea or vomiting. 30 tablet 1  . senna-docusate (SENOKOT-S) 8.6-50 MG tablet Take 1 tablet by mouth at bedtime.    .Marland Kitchendoxycycline (VIBRA-TABS) 100 MG tablet Take 1 tablet (100 mg total) by mouth 2 (two) times daily. Can give caps or generic (  Patient not taking: Reported on 08/24/2018) 14 tablet 0   No current facility-administered medications for this visit.     SURGICAL HISTORY:  Past Surgical History:  Procedure Laterality Date  . BREAST EXCISIONAL BIOPSY Left 20+ yrs ago   benign  . PERICARDIAL WINDOW N/A 01/10/2017   Procedure: PERICARDIAL WINDOW- SUB XYPHOID, RIGHT CHEST TUBE;  Surgeon: Ivin Poot, MD;  Location: [facility];  Service: Thoracic;  Laterality: N/A;  . VIDEO BRONCHOSCOPY Bilateral 10/21/2016   Procedure: VIDEO BRONCHOSCOPY WITH FLUORO;  Surgeon:  Tanda Rockers, MD;  Location: [facility];  Service: Cardiopulmonary;  Laterality: Bilateral;    REVIEW OF SYSTEMS:  A comprehensive review of systems was negative.   PHYSICAL EXAMINATION: General appearance: alert, cooperative and no distress Head: Normocephalic, without obvious abnormality, atraumatic Neck: no adenopathy, no JVD, supple, symmetrical, trachea midline and thyroid not enlarged, symmetric, no tenderness/mass/nodules Lymph nodes: Cervical, supraclavicular, and axillary nodes normal. Resp: clear to auscultation bilaterally Back: symmetric, no curvature. ROM normal. No CVA tenderness. Cardio: regular rate and rhythm, S1, S2 normal, no murmur, click, rub or gallop GI: soft, non-tender; bowel sounds normal; no masses,  no organomegaly Extremities: extremities normal, atraumatic, no cyanosis or edema  ECOG PERFORMANCE STATUS: 0 - Asymptomatic  Blood pressure 109/79, pulse 79, temperature 98.3 F (36.8 C), temperature source Oral, resp. rate 18, height '5\' 6"'$  (1.676 m), weight 244 lb 14.4 oz (111.1 kg), last menstrual period 03/15/2010, SpO2 100 %.  LABORATORY DATA: Lab Results  Component Value Date   WBC 9.8 08/24/2018   HGB 13.3 08/24/2018   HCT 42.3 08/24/2018   MCV 92.0 08/24/2018   PLT 226 08/24/2018      Chemistry      Component Value Date/Time   NA 140 06/24/2018 1201   NA 135 (L) 01/08/2017 1055   K 4.2 06/24/2018 1201   K 3.2 (L) 01/08/2017 1055   CL 104 06/24/2018 1201   CO2 29 06/24/2018 1201   CO2 25 01/08/2017 1055   BUN 13 06/24/2018 1201   BUN 14.6 01/08/2017 1055   CREATININE 1.21 (H) 06/24/2018 1201   CREATININE 1.3 (H) 01/08/2017 1055      Component Value Date/Time   CALCIUM 9.3 06/24/2018 1201   CALCIUM 9.5 01/08/2017 1055   ALKPHOS 95 06/24/2018 1201   ALKPHOS 82 01/08/2017 1055   AST 20 06/24/2018 1201   AST 31 01/08/2017 1055   ALT 15 06/24/2018 1201   ALT 34 01/08/2017 1055   BILITOT 0.4 06/24/2018 1201   BILITOT 1.74 (H)  01/08/2017 1055       RADIOGRAPHIC STUDIES: No results found.  ASSESSMENT AND PLAN: This is a very pleasant 53 years old African-American female with metastatic non-small cell lung cancer, adenocarcinoma and positive for EGFR mutation with deletion in exon 19.  The patient is currently on treatment with GILOTRIF 40 mg p.o. daily status post 13 months.  The patient is tolerating this treatment well except for few episodes of diarrhea and mild skin rash. She is complaining of dizzy spells and vertigo recently. Repeat CT scan of the chest, abdomen and pelvis performed recently showed some evidence for disease progression with increase in size and number of pulmonary nodules. Her treatment was switched to Tagrisso 80 mg p.o. daily started January 01, 2018.  She is status post 7 months of treatment She continues to tolerate her treatment well with no concerning adverse effects. Her CBC today is unremarkable.  Comprehensive metabolic panel is still pending. I recommended  for the patient to continue her current treatment with Tagrisso with the same dose. I will see her back for follow-up visit in 1 months for evaluation with repeat CT scan of the chest, abdomen and pelvis for restaging of her disease.  The patient was advised to call immediately if she has any concerning symptoms in the interval. The patient voices understanding of current disease status and treatment options and is in agreement with the current care plan. All questions were answered. The patient knows to call the clinic with any problems, questions or concerns. We can certainly see the patient much sooner if necessary.  Disclaimer: This note was dictated with voice recognition software. Similar sounding words can inadvertently be transcribed and may not be corrected upon review.

## 2018-08-24 NOTE — Telephone Encounter (Signed)
Scheduled appt per 7/28 los. Printed calendar and avs.

## 2018-08-25 ENCOUNTER — Other Ambulatory Visit: Payer: Self-pay | Admitting: Family Medicine

## 2018-08-25 DIAGNOSIS — K219 Gastro-esophageal reflux disease without esophagitis: Secondary | ICD-10-CM

## 2018-08-25 DIAGNOSIS — R609 Edema, unspecified: Secondary | ICD-10-CM

## 2018-08-25 MED FILL — PANTOPRAZOLE SOD DR 40 MG T: 40 | 30 days supply | Qty: 30 | Fill #0

## 2018-08-25 MED FILL — CLINDAMYCIN PH 1% GEL: 1 | 20 days supply | Qty: 30 | Fill #1

## 2018-08-25 MED FILL — POTASSIUM CHLORIDE CRYS ER: 20 | 30 days supply | Qty: 60 | Fill #0

## 2018-08-25 MED FILL — FUROSEMIDE 40 MG TAB: 40 | 30 days supply | Qty: 30 | Fill #5

## 2018-09-13 ENCOUNTER — Other Ambulatory Visit: Payer: Self-pay | Admitting: Internal Medicine

## 2018-09-13 DIAGNOSIS — C3490 Malignant neoplasm of unspecified part of unspecified bronchus or lung: Secondary | ICD-10-CM

## 2018-09-21 ENCOUNTER — Other Ambulatory Visit: Payer: Self-pay

## 2018-09-21 ENCOUNTER — Ambulatory Visit (HOSPITAL_COMMUNITY)
Admission: RE | Admit: 2018-09-21 | Discharge: 2018-09-21 | Disposition: A | Discharge status: Home / Self Care | Payer: 59 | Source: Ambulatory Visit | Attending: Internal Medicine | Admitting: Internal Medicine

## 2018-09-21 DIAGNOSIS — C3490 Malignant neoplasm of unspecified part of unspecified bronchus or lung: Secondary | ICD-10-CM | POA: Diagnosis present

## 2018-09-21 MED ORDER — GADOBUTROL 1 MMOL/ML IV SOLN
10.0000 mL | Freq: Once | INTRAVENOUS | Status: AC | PRN
  Administered 2018-09-21: 10 mL via INTRAVENOUS

## 2018-09-22 ENCOUNTER — Other Ambulatory Visit: Payer: Self-pay | Admitting: Family Medicine

## 2018-09-22 DIAGNOSIS — L853 Xerosis cutis: Secondary | ICD-10-CM

## 2018-09-22 DIAGNOSIS — C3492 Malignant neoplasm of unspecified part of left bronchus or lung: Secondary | ICD-10-CM

## 2018-09-22 DIAGNOSIS — R609 Edema, unspecified: Secondary | ICD-10-CM

## 2018-09-22 DIAGNOSIS — R52 Pain, unspecified: Secondary | ICD-10-CM

## 2018-09-22 MED FILL — FLUTICASONE PROP 50 MCG SPR: 50 | 30 days supply | Qty: 16 | Fill #0

## 2018-09-22 MED FILL — PANTOPRAZOLE SOD DR 40 MG T: 40 | 30 days supply | Qty: 30 | Fill #1

## 2018-09-22 MED FILL — METHOCARBAMOL 500 MG TABS: 500 | 10 days supply | Qty: 30 | Fill #4

## 2018-09-22 MED FILL — POTASSIUM CHLORIDE CRYS ER: 20 | 30 days supply | Qty: 60 | Fill #1

## 2018-09-24 ENCOUNTER — Other Ambulatory Visit: Payer: Self-pay

## 2018-09-24 ENCOUNTER — Inpatient Hospital Stay: Payer: 59 | Attending: Oncology

## 2018-09-24 ENCOUNTER — Ambulatory Visit (HOSPITAL_COMMUNITY)
Admission: RE | Admit: 2018-09-24 | Discharge: 2018-09-24 | Disposition: A | Discharge status: Home / Self Care | Payer: 59 | Source: Ambulatory Visit | Attending: Internal Medicine | Admitting: Internal Medicine

## 2018-09-24 DIAGNOSIS — R197 Diarrhea, unspecified: Secondary | ICD-10-CM | POA: Insufficient documentation

## 2018-09-24 DIAGNOSIS — C7931 Secondary malignant neoplasm of brain: Secondary | ICD-10-CM | POA: Insufficient documentation

## 2018-09-24 DIAGNOSIS — C3492 Malignant neoplasm of unspecified part of left bronchus or lung: Secondary | ICD-10-CM | POA: Diagnosis not present

## 2018-09-24 DIAGNOSIS — R21 Rash and other nonspecific skin eruption: Secondary | ICD-10-CM | POA: Insufficient documentation

## 2018-09-24 DIAGNOSIS — C3490 Malignant neoplasm of unspecified part of unspecified bronchus or lung: Secondary | ICD-10-CM | POA: Diagnosis not present

## 2018-09-24 DIAGNOSIS — J90 Pleural effusion, not elsewhere classified: Secondary | ICD-10-CM | POA: Diagnosis not present

## 2018-09-24 LAB — CBC WITH DIFFERENTIAL (CANCER CENTER ONLY)
Abs Immature Granulocytes: 0.02 10*3/uL (ref 0.00–0.07)
Basophils Absolute: 0 10*3/uL (ref 0.0–0.1)
Basophils Relative: 0 %
Eosinophils Absolute: 0.4 10*3/uL (ref 0.0–0.5)
Eosinophils Relative: 4 %
HCT: 40.9 % (ref 36.0–46.0)
Hemoglobin: 12.9 g/dL (ref 12.0–15.0)
Immature Granulocytes: 0 %
Lymphocytes Relative: 18 %
Lymphs Abs: 1.9 10*3/uL (ref 0.7–4.0)
MCH: 28.9 pg (ref 26.0–34.0)
MCHC: 31.5 g/dL (ref 30.0–36.0)
MCV: 91.5 fL (ref 80.0–100.0)
Monocytes Absolute: 0.9 10*3/uL (ref 0.1–1.0)
Monocytes Relative: 9 %
Neutro Abs: 7.1 10*3/uL (ref 1.7–7.7)
Neutrophils Relative %: 69 %
Platelet Count: 227 10*3/uL (ref 150–400)
RBC: 4.47 MIL/uL (ref 3.87–5.11)
RDW: 14.1 % (ref 11.5–15.5)
WBC Count: 10.2 10*3/uL (ref 4.0–10.5)
nRBC: 0 % (ref 0.0–0.2)

## 2018-09-24 LAB — CMP (CANCER CENTER ONLY)
ALT: 8 U/L (ref 0–44)
AST: 15 U/L (ref 15–41)
Albumin: 3.3 g/dL — ABNORMAL LOW (ref 3.5–5.0)
Alkaline Phosphatase: 105 U/L (ref 38–126)
Anion gap: 12 (ref 5–15)
BUN: 14 mg/dL (ref 6–20)
CO2: 25 mmol/L (ref 22–32)
Calcium: 9.3 mg/dL (ref 8.9–10.3)
Chloride: 103 mmol/L (ref 98–111)
Creatinine: 1.43 mg/dL — ABNORMAL HIGH (ref 0.44–1.00)
GFR, Est AFR Am: 49 mL/min — ABNORMAL LOW (ref >60)
GFR, Estimated: 42 mL/min — ABNORMAL LOW (ref >60)
Glucose, Bld: 105 mg/dL — ABNORMAL HIGH (ref 70–99)
Potassium: 4.2 mmol/L (ref 3.5–5.1)
Sodium: 140 mmol/L (ref 135–145)
Total Bilirubin: 0.4 mg/dL (ref 0.3–1.2)
Total Protein: 7.1 g/dL (ref 6.5–8.1)

## 2018-09-24 MED ORDER — IOHEXOL 300 MG/ML  SOLN
100.0000 mL | Freq: Once | INTRAMUSCULAR | Status: AC | PRN
  Administered 2018-09-24: 100 mL via INTRAVENOUS

## 2018-09-24 MED ORDER — SODIUM CHLORIDE (PF) 0.9 % IJ SOLN
INTRAMUSCULAR | Status: AC
  Filled 2018-09-24: qty 50

## 2018-09-24 MED FILL — CLINDAMYCIN PH 1% GEL: 1 | 20 days supply | Qty: 30 | Fill #0

## 2018-09-24 MED FILL — oxyCODONE HCL 10 MG TABS: 10 | 20 days supply | Qty: 60 | Fill #0

## 2018-09-24 MED FILL — FUROSEMIDE 40 MG TAB: 40 | 30 days supply | Qty: 90 | Fill #0

## 2018-09-27 ENCOUNTER — Inpatient Hospital Stay (HOSPITAL_BASED_OUTPATIENT_CLINIC_OR_DEPARTMENT_OTHER): Payer: 59 | Admitting: Internal Medicine

## 2018-09-27 ENCOUNTER — Other Ambulatory Visit: Payer: Self-pay

## 2018-09-27 ENCOUNTER — Other Ambulatory Visit (HOSPITAL_COMMUNITY)
Admission: RE | Admit: 2018-09-27 | Discharge: 2018-09-27 | Disposition: A | Discharge status: Home / Self Care | Payer: 59 | Source: Ambulatory Visit | Attending: Internal Medicine | Admitting: Internal Medicine

## 2018-09-27 ENCOUNTER — Telehealth: Payer: Self-pay | Admitting: Internal Medicine

## 2018-09-27 ENCOUNTER — Encounter: Payer: Self-pay | Admitting: Internal Medicine

## 2018-09-27 VITALS — BP 108/73 | HR 84 | Temp 98.0°F | Resp 18 | Ht 66.0 in | Wt 243.5 lb

## 2018-09-27 DIAGNOSIS — C7931 Secondary malignant neoplasm of brain: Secondary | ICD-10-CM

## 2018-09-27 DIAGNOSIS — I10 Essential (primary) hypertension: Secondary | ICD-10-CM | POA: Diagnosis not present

## 2018-09-27 DIAGNOSIS — C3492 Malignant neoplasm of unspecified part of left bronchus or lung: Secondary | ICD-10-CM | POA: Diagnosis not present

## 2018-09-27 DIAGNOSIS — J90 Pleural effusion, not elsewhere classified: Secondary | ICD-10-CM | POA: Diagnosis present

## 2018-09-27 DIAGNOSIS — Z5111 Encounter for antineoplastic chemotherapy: Secondary | ICD-10-CM

## 2018-09-27 DIAGNOSIS — C3490 Malignant neoplasm of unspecified part of unspecified bronchus or lung: Secondary | ICD-10-CM

## 2018-09-27 DIAGNOSIS — Z20828 Contact with and (suspected) exposure to other viral communicable diseases: Secondary | ICD-10-CM | POA: Diagnosis not present

## 2018-09-27 NOTE — Telephone Encounter (Signed)
Gave avs and calendar ° °

## 2018-09-27 NOTE — Progress Notes (Signed)
[facility] Telephone:([phone]) [phone]   Fax:([phone]) [phone]  OFFICE PROGRESS NOTE  Copland, Gay Filler, MD [address] [place] [address]  DIAGNOSIS: DIAGNOSIS: stage IV (T3, N2, M1 a) non-small cell lung cancer, poorly differentiated adenocarcinoma with extensive miliary distribution in the lungs bilaterally diagnosed in September 2018. 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 NEGATIVE for the Exon 20 [id number] mutation. Repeat molecular studies by guardant 360 at time of progression showed ATM [id number]  PRIOR THERAPY: Gilotrif 40 mg daily started 11/08/2016.Status post 13 months of treatment.  CURRENT THERAPY: Tagrisso 80 mg p.o. daily.  First dose started January 01, 2018.  Status post 9 months of treatment.  INTERVAL HISTORY: Nancy EvesBest 53y.o. female returns to the clinic today for follow-up visit.  The patient is feeling fine today with no concerning complaints except for pain under her breast bilaterally more on the left side.  She denied having any current shortness of breath except with exertion with mild cough and no hemoptysis.  She denied having any recent weight loss or night sweats.  She has no nausea, vomiting, diarrhea or constipation.  The patient denied having any headache or visual changes.  She has no fever or chills.  She continues to tolerate her treatment with Tagrisso fairly well.  She had repeat MRI of the brain as well as CT scan of the chest, abdomen pelvis performed recently and she is here for evaluation and discussion of her scan results.   MEDICAL HISTORY: Past Medical History:  Diagnosis Date   Adenocarcinoma of left lung, stage 4 ([place] 10/28/2016   Goals of care, counseling/discussion 10/28/2016   Hypertension     ALLERGIES:  is allergic to percocet [oxycodone-acetaminophen]; lisinopril; and losartan.  MEDICATIONS:  Current Outpatient Medications  Medication Sig Dispense Refill   ammonium lactate (LAC-HYDRIN)  12 % lotion Apply topically as needed for dry skin. 400 g 0   clindamycin (CLINDAGEL) 1 % gel APPLY TO THE AFFECTED AREA(S) TWICE DAILY 30 g 1   doxycycline (VIBRA-TABS) 100 MG tablet Take 1 tablet (100 mg total) by mouth 2 (two) times daily. Can give caps or generic (Patient not taking: Reported on 08/24/2018) 14 tablet 0   fluticasone (FLONASE) 50 MCG/ACT nasal spray Place 2 sprays into both nostrils daily.     furosemide (LASIX) 40 MG tablet TAKE 1 TABLET BY MOUTH ONCE DAILY 90 tablet 1   loperamide (IMODIUM) 1 MG/5ML solution Take 2 mg as needed by mouth for diarrhea or loose stools.     magnesium oxide (MAG-OX) 400 (241.3 Mg) MG tablet Take 1 tablet (400 mg total) by mouth daily.     methocarbamol (ROBAXIN) 500 MG tablet TAKE 1 TABLET (500 MG TOTAL) BY MOUTH EVERY 8 (EIGHT) HOURS AS NEEDED FOR MUSCLE SPASMS. 30 tablet 5   nystatin cream (MYCOSTATIN) Apply 1 application topically 2 (two) times daily. 30 g 0   Oxycodone HCl 10 MG TABS TAKE 1 TABLET BY MOUTH 3 TIMES DAILY AS NEEDED 60 tablet 0   pantoprazole (PROTONIX) 40 MG tablet TAKE 1 TABLET (40 MG TOTAL) BY MOUTH DAILY AT 12 NOON. 90 tablet 3   potassium chloride SA (K-DUR) 20 MEQ tablet TAKE 1 TABLET (20 MEQ TOTAL) BY MOUTH 2 TIMES DAILY. 180 tablet 3   prochlorperazine (COMPAZINE) 10 MG tablet Take 1 tablet (10 mg total) every 6 (six) hours as needed by mouth for nausea or vomiting. 30 tablet 1  senna-docusate (SENOKOT-S) 8.6-50 MG tablet Take 1 tablet by mouth at bedtime.     TAGRISSO 80 MG tablet TAKE 1 TABLET BY MOUTH ONCE DAILY 30 tablet 0   No current facility-administered medications for this visit.     SURGICAL HISTORY:  Past Surgical History:  Procedure Laterality Date   BREAST EXCISIONAL BIOPSY Left 20+ yrs ago   benign   PERICARDIAL WINDOW N/A 01/10/2017   Procedure: PERICARDIAL WINDOW- SUB XYPHOID, RIGHT CHEST TUBE;  Surgeon: Ivin Poot, MD;  Location: [facility];  Service: Thoracic;  Laterality: N/A;    VIDEO BRONCHOSCOPY Bilateral 10/21/2016   Procedure: VIDEO BRONCHOSCOPY WITH FLUORO;  Surgeon: Tanda Rockers, MD;  Location: [facility];  Service: Cardiopulmonary;  Laterality: Bilateral;    REVIEW OF SYSTEMS:  Constitutional: negative Eyes: negative Ears, nose, mouth, throat, and face: negative Respiratory: positive for dyspnea on exertion and pleurisy/chest pain Cardiovascular: negative Gastrointestinal: negative Genitourinary:negative Integument/breast: negative Hematologic/lymphatic: negative Musculoskeletal:negative Neurological: negative Behavioral/Psych: negative Endocrine: negative Allergic/Immunologic: negative   PHYSICAL EXAMINATION: General appearance: alert, cooperative and no distress Head: Normocephalic, without obvious abnormality, atraumatic Neck: no adenopathy, no JVD, supple, symmetrical, trachea midline and thyroid not enlarged, symmetric, no tenderness/mass/nodules Lymph nodes: Cervical, supraclavicular, and axillary nodes normal. Resp: diminished breath sounds LLL and dullness to percussion LLL Back: symmetric, no curvature. ROM normal. No CVA tenderness. Cardio: regular rate and rhythm, S1, S2 normal, no murmur, click, rub or gallop GI: soft, non-tender; bowel sounds normal; no masses,  no organomegaly Extremities: extremities normal, atraumatic, no cyanosis or edema Neurologic: Alert and oriented X 3, normal strength and tone. Normal symmetric reflexes. Normal coordination and gait  ECOG PERFORMANCE STATUS: 1 - Symptomatic but completely ambulatory  Blood pressure 108/73, pulse 84, temperature 98 F (36.7 C), temperature source Oral, resp. rate 18, height _0  (1.676 m), weight 243 lb 8 oz (110.5 kg), last menstrual period 03/15/2010, SpO2 95 %.  LABORATORY DATA: Lab Results  Component Value Date   WBC 10.2 09/24/2018   HGB 12.9 09/24/2018   HCT 40.9 09/24/2018   MCV 91.5 09/24/2018   PLT 227 09/24/2018      Chemistry      Component Value  Date/Time   NA 140 09/24/2018 0830   NA 135 (L) 01/08/2017 1055   K 4.2 09/24/2018 0830   K 3.2 (L) 01/08/2017 1055   CL 103 09/24/2018 0830   CO2 25 09/24/2018 0830   CO2 25 01/08/2017 1055   BUN 14 09/24/2018 0830   BUN 14.6 01/08/2017 1055   CREATININE 1.43 (H) 09/24/2018 0830   CREATININE 1.3 (H) 01/08/2017 1055      Component Value Date/Time   CALCIUM 9.3 09/24/2018 0830   CALCIUM 9.5 01/08/2017 1055   ALKPHOS 105 09/24/2018 0830   ALKPHOS 82 01/08/2017 1055   AST 15 09/24/2018 0830   AST 31 01/08/2017 1055   ALT 8 09/24/2018 0830   ALT 34 01/08/2017 1055   BILITOT 0.4 09/24/2018 0830   BILITOT 1.74 (H) 01/08/2017 1055       RADIOGRAPHIC STUDIES: Ct Chest W Contrast  Result Date: 09/24/2018 CLINICAL DATA:  53 year old female with history of bilateral lung cancer since September EXAM: CT CHEST, ABDOMEN, AND PELVIS WITH CONTRAST TECHNIQUE: Multidetector CT imaging of the chest, abdomen and pelvis was performed following the standard protocol during bolus administration of intravenous contrast. CONTRAST:  192m OMNIPAQUE IOHEXOL 300 MG/ML  SOLN COMPARISON:  Chest CT abdomen and pelvis 06/09/2018, as well as many other priors. FINDINGS: CT  CHEST FINDINGS Cardiovascular: Heart size is normal. There is no significant pericardial fluid, thickening or pericardial calcification. Aortic atherosclerosis. No coronary artery calcifications. Mediastinum/Nodes: No pathologically enlarged mediastinal or hilar lymph nodes. Esophagus is unremarkable in appearance. No axillary lymphadenopathy. Lungs/Pleura: Direct comparison with the prior study is challenging secondary to extensive patient respiratory motion, as well as compression of much of the lung parenchyma by the new moderate to large left pleural effusion. With this limitation in mind, there continue to be multiple pulmonary nodules in the lungs bilaterally which overall appear relatively similar to the prior study. The largest of these  include a nodule in the superior segment of the left lower lobe (axial image 53 of series 4) measuring 1 cm, a left lower lobe nodule (axial image 72 of series 4) measuring 1.1 x 0.8 cm, and a nodule in the posterior aspect of the right upper lobe abutting the major fissure with some extension into the adjacent superior segment of the right lower lobe measuring 1.9 x 11.1 cm (axial image 47 of series 4). Widespread septal thickening and extensive peribronchovascular micronodularity again noted, increased in the left upper lobe anteriorly when compared to the prior examination, where there are new areas of confluent consolidation. Patchy ground-glass attenuation in a peribronchovascular distribution most evident throughout the mid to upper lungs bilaterally, also similar to the prior examination. Trace amount of pleural thickening and enhancement posteriorly, [place] appreciated on axial image 45 of series 2. No right pleural effusion. Musculoskeletal: There are no aggressive appearing lytic or blastic lesions noted in the visualized portions of the skeleton. CT ABDOMEN PELVIS FINDINGS Hepatobiliary: No suspicious cystic or solid hepatic lesions. No intra or extrahepatic biliary ductal dilatation. Gallbladder is normal in appearance. Pancreas: No pancreatic mass. No pancreatic ductal dilatation. No pancreatic or peripancreatic fluid collections or inflammatory changes. Spleen: Unremarkable. Adrenals/Urinary Tract: Bilateral kidneys and adrenal glands are normal in appearance. No hydroureteronephrosis. Urinary bladder is normal in appearance. Stomach/Bowel: Normal appearance of the stomach. No pathologic dilatation of small bowel or colon. The appendix is not confidently identified and may be surgically absent. Regardless, there are no inflammatory changes noted adjacent to the cecum to suggest the presence of an acute appendicitis at this time. Vascular/Lymphatic: Aortic atherosclerosis, without evidence of aneurysm or  dissection in the abdominal or pelvic vasculature. No lymphadenopathy noted in the abdomen or pelvis. Reproductive: Status post hysterectomy. Ovaries are not confidently identified may be surgically absent or atrophic. Other: No significant volume of ascites.  No pneumoperitoneum. Musculoskeletal: There are no aggressive appearing lytic or blastic lesions noted in the visualized portions of the skeleton. IMPRESSION: 1. Moderate to large left pleural effusion, new compared to the prior examination, with probable thin pleural enhancement posteriorly, concerning for a malignant pleural effusion. 2. Overall appearance of the lungs is very similar to the prior study allowing for the limitations of today's examination, including the presence of multiple small pulmonary nodules, with exception of some increasing consolidation in the medial aspect of the left upper lobe. This appearance is concerning for lymphangitic spread of tumor in the lungs. 3. No definite signs of metastatic disease in the abdomen or pelvis. 4. Aortic atherosclerosis. 5. Additional incidental findings, as above. Electronically Signed   By: Vinnie Langton M.D.   On: 09/24/2018 14:06   Nancy Moore IR Contrast  Result Date: 09/21/2018 CLINICAL DATA:  53 year old female with metastatic non-small cell lung cancer to the brain. Status post 7-8 months of medical therapy using Tagrisso (EGFR targeted therapy  that crosses the blood brain barrier) EXAM: MRI HEAD WITHOUT AND WITH CONTRAST TECHNIQUE: Multiplanar, multiecho pulse sequences of the brain and surrounding structures were obtained without and with intravenous contrast. CONTRAST:  10 milliliters Gadavist COMPARISON:  06/09/2018 and earlier. FINDINGS: Brain: No residual enhancing brain metastases are identified when compared to the December 2019 study. The post treatment hemosiderin associated with these was much better demonstrated on susceptibility weighted imaging on the May 2020 study. No  cerebral edema. No abnormal enhancement identified. No midline shift, mass effect, or evidence of intracranial mass lesion. No dural thickening. No restricted diffusion to suggest acute infarction. No ventriculomegaly, extra-axial collection or acute intracranial hemorrhage. Cervicomedullary junction and pituitary are within normal limits. Pearline Cables and white matter signal is within normal limits. Vascular: Major intracranial vascular flow voids are stable. The major dural venous sinuses are enhancing and appear to be patent. Skull and upper cervical spine: Negative visible cervical spine and spinal cord. Visualized bone marrow signal is within normal limits. Sinuses/Orbits: Negative orbits. Stable minor paranasal sinus mucosal thickening. Other: Mastoids remain clear. Visible internal auditory structures appear normal. Scalp and face soft tissues appear negative. IMPRESSION: 1. Essentially resolved innumerable small brain metastases since the MRI on 01/05/2018. 2. No new metastatic disease or acute intracranial abnormality identified. Electronically Signed   By: Genevie Ann M.D.   On: 09/21/2018 14:58   Ct Abdomen Pelvis W Contrast  Result Date: 09/24/2018 CLINICAL DATA:  53 year old female with history of bilateral lung cancer since September EXAM: CT CHEST, ABDOMEN, AND PELVIS WITH CONTRAST TECHNIQUE: Multidetector CT imaging of the chest, abdomen and pelvis was performed following the standard protocol during bolus administration of intravenous contrast. CONTRAST:  122m OMNIPAQUE IOHEXOL 300 MG/ML  SOLN COMPARISON:  Chest CT abdomen and pelvis 06/09/2018, as well as many other priors. FINDINGS: CT CHEST FINDINGS Cardiovascular: Heart size is normal. There is no significant pericardial fluid, thickening or pericardial calcification. Aortic atherosclerosis. No coronary artery calcifications. Mediastinum/Nodes: No pathologically enlarged mediastinal or hilar lymph nodes. Esophagus is unremarkable in appearance. No  axillary lymphadenopathy. Lungs/Pleura: Direct comparison with the prior study is challenging secondary to extensive patient respiratory motion, as well as compression of much of the lung parenchyma by the new moderate to large left pleural effusion. With this limitation in mind, there continue to be multiple pulmonary nodules in the lungs bilaterally which overall appear relatively similar to the prior study. The largest of these include a nodule in the superior segment of the left lower lobe (axial image 53 of series 4) measuring 1 cm, a left lower lobe nodule (axial image 72 of series 4) measuring 1.1 x 0.8 cm, and a nodule in the posterior aspect of the right upper lobe abutting the major fissure with some extension into the adjacent superior segment of the right lower lobe measuring 1.9 x 11.1 cm (axial image 47 of series 4). Widespread septal thickening and extensive peribronchovascular micronodularity again noted, increased in the left upper lobe anteriorly when compared to the prior examination, where there are new areas of confluent consolidation. Patchy ground-glass attenuation in a peribronchovascular distribution most evident throughout the mid to upper lungs bilaterally, also similar to the prior examination. Trace amount of pleural thickening and enhancement posteriorly, Pare appreciated on axial image 45 of series 2. No right pleural effusion. Musculoskeletal: There are no aggressive appearing lytic or blastic lesions noted in the visualized portions of the skeleton. CT ABDOMEN PELVIS FINDINGS Hepatobiliary: No suspicious cystic or solid hepatic lesions. No intra or  extrahepatic biliary ductal dilatation. Gallbladder is normal in appearance. Pancreas: No pancreatic mass. No pancreatic ductal dilatation. No pancreatic or peripancreatic fluid collections or inflammatory changes. Spleen: Unremarkable. Adrenals/Urinary Tract: Bilateral kidneys and adrenal glands are normal in appearance. No  hydroureteronephrosis. Urinary bladder is normal in appearance. Stomach/Bowel: Normal appearance of the stomach. No pathologic dilatation of small bowel or colon. The appendix is not confidently identified and may be surgically absent. Regardless, there are no inflammatory changes noted adjacent to the cecum to suggest the presence of an acute appendicitis at this time. Vascular/Lymphatic: Aortic atherosclerosis, without evidence of aneurysm or dissection in the abdominal or pelvic vasculature. No lymphadenopathy noted in the abdomen or pelvis. Reproductive: Status post hysterectomy. Ovaries are not confidently identified may be surgically absent or atrophic. Other: No significant volume of ascites.  No pneumoperitoneum. Musculoskeletal: There are no aggressive appearing lytic or blastic lesions noted in the visualized portions of the skeleton. IMPRESSION: 1. Moderate to large left pleural effusion, new compared to the prior examination, with probable thin pleural enhancement posteriorly, concerning for a malignant pleural effusion. 2. Overall appearance of the lungs is very similar to the prior study allowing for the limitations of today's examination, including the presence of multiple small pulmonary nodules, with exception of some increasing consolidation in the medial aspect of the left upper lobe. This appearance is concerning for lymphangitic spread of tumor in the lungs. 3. No definite signs of metastatic disease in the abdomen or pelvis. 4. Aortic atherosclerosis. 5. Additional incidental findings, as above. Electronically Signed   By: Vinnie Langton M.D.   On: 09/24/2018 14:06    ASSESSMENT AND PLAN: This is a very pleasant 53 years old African-American female with metastatic non-small cell lung cancer, adenocarcinoma and positive for EGFR mutation with deletion in exon 19.  The patient is currently on treatment with GILOTRIF 40 mg p.o. daily status post 13 months.  The patient is tolerating this  treatment well except for few episodes of diarrhea and mild skin rash. She is complaining of dizzy spells and vertigo recently. Repeat CT scan of the chest, abdomen and pelvis performed recently showed some evidence for disease progression with increase in size and number of pulmonary nodules. Her treatment was switched to Tagrisso 80 mg p.o. daily started January 01, 2018.  She is status post 8 months of treatment The patient continues to tolerate her treatment with Tagrisso fairly well. She had repeat MRI of the brain that showed further improvement in the brain metastasis.  She also had repeat CT scan of the chest, abdomen pelvis performed recently.  I personally and independently reviewed the scan images and discussed the results with the patient today. Her scan showed no concerning findings for disease progression except for enlarging left sided pleural effusion. I recommended for the patient to continue her current treatment with Tagrisso with the same dose. I will arrange for the patient to have ultrasound-guided left thoracentesis for drainage of the recurrent pleural effusion. I will see her back for follow-up visit in 1 months for evaluation with repeat blood work. She was advised to call immediately if she has any concerning symptoms in the interval. The patient voices understanding of current disease status and treatment options and is in agreement with the current care plan. All questions were answered. The patient knows to call the clinic with any problems, questions or concerns. We can certainly see the patient much sooner if necessary.  Disclaimer: This note was dictated with voice recognition software. Similar  sounding words can inadvertently be transcribed and may not be corrected upon review.

## 2018-09-28 LAB — SARS CORONAVIRUS 2 (TAT 6-24 HRS): SARS Coronavirus 2: NEGATIVE

## 2018-09-30 ENCOUNTER — Ambulatory Visit (HOSPITAL_COMMUNITY)
Admission: RE | Admit: 2018-09-30 | Discharge: 2018-09-30 | Disposition: A | Discharge status: Home / Self Care | Payer: 59 | Source: Ambulatory Visit | Attending: Physician Assistant | Admitting: Physician Assistant

## 2018-09-30 ENCOUNTER — Ambulatory Visit (HOSPITAL_COMMUNITY)
Admission: RE | Admit: 2018-09-30 | Discharge: 2018-09-30 | Disposition: A | Discharge status: Home / Self Care | Payer: 59 | Source: Ambulatory Visit | Attending: Internal Medicine | Admitting: Internal Medicine

## 2018-09-30 ENCOUNTER — Other Ambulatory Visit: Payer: Self-pay

## 2018-09-30 DIAGNOSIS — C3490 Malignant neoplasm of unspecified part of unspecified bronchus or lung: Secondary | ICD-10-CM | POA: Insufficient documentation

## 2018-09-30 DIAGNOSIS — C3492 Malignant neoplasm of unspecified part of left bronchus or lung: Secondary | ICD-10-CM

## 2018-09-30 DIAGNOSIS — J90 Pleural effusion, not elsewhere classified: Secondary | ICD-10-CM | POA: Diagnosis not present

## 2018-09-30 DIAGNOSIS — Z20828 Contact with and (suspected) exposure to other viral communicable diseases: Secondary | ICD-10-CM | POA: Insufficient documentation

## 2018-09-30 MED ORDER — LIDOCAINE HCL 1 % IJ SOLN
INTRAMUSCULAR | Status: AC
  Filled 2018-09-30: qty 10

## 2018-09-30 NOTE — Procedures (Signed)
PROCEDURE SUMMARY:  Successful US guided left thoracentesis. Yielded 1.4 L of red fluid. Patient tolerated procedure well. No immediate complications. EBL = trace  Post procedure chest X-ray reveals no pneumothorax  WENDY S BLAIR PA-C 09/30/2018 12:40 PM

## 2018-10-11 ENCOUNTER — Telehealth: Payer: Self-pay | Admitting: Medical Oncology

## 2018-10-11 NOTE — Telephone Encounter (Signed)
She may need CT angiogram of the chest to rule out pulmonary embolism or recurrent pleural effusion.

## 2018-10-11 NOTE — Telephone Encounter (Addendum)
Pt concerns-Resting and exertional Tachycardia and low sat . "Sticking pain" with deep inhalation on Left side chest.Wheezing cough persists after thoracentesis 10 days ago. Afebrile. Taking one  oxycodone and it stops the cough and pain so then heart goes down but never below 100.  "I am putting myself on bedrest". Post cxr -ok  Home readings- Pulse range 100-110 Sats range from 87-96.  I instructed Errica to go to ED if symptoms worsen.

## 2018-10-12 ENCOUNTER — Other Ambulatory Visit: Payer: Self-pay | Admitting: Medical Oncology

## 2018-10-12 ENCOUNTER — Other Ambulatory Visit: Payer: Self-pay | Admitting: Internal Medicine

## 2018-10-12 ENCOUNTER — Telehealth: Payer: Self-pay | Admitting: Medical Oncology

## 2018-10-12 DIAGNOSIS — R0602 Shortness of breath: Secondary | ICD-10-CM

## 2018-10-12 NOTE — Telephone Encounter (Signed)
LVM to expect call from central scheduling for appt for CT angio.

## 2018-10-13 ENCOUNTER — Emergency Department (HOSPITAL_COMMUNITY): Payer: 59

## 2018-10-13 ENCOUNTER — Observation Stay (HOSPITAL_COMMUNITY)
Admission: EM | Admit: 2018-10-13 | Discharge: 2018-10-14 | Disposition: A | Discharge status: Home / Self Care | Payer: 59 | Attending: Internal Medicine | Admitting: Internal Medicine

## 2018-10-13 ENCOUNTER — Other Ambulatory Visit: Payer: Self-pay

## 2018-10-13 ENCOUNTER — Telehealth: Payer: Self-pay | Admitting: Medical Oncology

## 2018-10-13 DIAGNOSIS — D72829 Elevated white blood cell count, unspecified: Secondary | ICD-10-CM | POA: Diagnosis present

## 2018-10-13 DIAGNOSIS — Z20828 Contact with and (suspected) exposure to other viral communicable diseases: Secondary | ICD-10-CM | POA: Insufficient documentation

## 2018-10-13 DIAGNOSIS — Z79899 Other long term (current) drug therapy: Secondary | ICD-10-CM | POA: Diagnosis not present

## 2018-10-13 DIAGNOSIS — I10 Essential (primary) hypertension: Secondary | ICD-10-CM | POA: Diagnosis not present

## 2018-10-13 DIAGNOSIS — C7931 Secondary malignant neoplasm of brain: Secondary | ICD-10-CM | POA: Diagnosis not present

## 2018-10-13 DIAGNOSIS — C3492 Malignant neoplasm of unspecified part of left bronchus or lung: Secondary | ICD-10-CM | POA: Diagnosis present

## 2018-10-13 DIAGNOSIS — C3490 Malignant neoplasm of unspecified part of unspecified bronchus or lung: Secondary | ICD-10-CM | POA: Diagnosis present

## 2018-10-13 DIAGNOSIS — J910 Malignant pleural effusion: Secondary | ICD-10-CM | POA: Diagnosis present

## 2018-10-13 DIAGNOSIS — J90 Pleural effusion, not elsewhere classified: Secondary | ICD-10-CM | POA: Diagnosis present

## 2018-10-13 DIAGNOSIS — Z9889 Other specified postprocedural states: Secondary | ICD-10-CM

## 2018-10-13 LAB — PROTIME-INR
INR: 1.2 (ref 0.8–1.2)
Prothrombin Time: 15.3 seconds — ABNORMAL HIGH (ref 11.4–15.2)

## 2018-10-13 LAB — CBC
HCT: 43.1 % (ref 36.0–46.0)
Hemoglobin: 13.5 g/dL (ref 12.0–15.0)
MCH: 28.8 pg (ref 26.0–34.0)
MCHC: 31.3 g/dL (ref 30.0–36.0)
MCV: 91.9 fL (ref 80.0–100.0)
Platelets: 315 10*3/uL (ref 150–400)
RBC: 4.69 MIL/uL (ref 3.87–5.11)
RDW: 13.8 % (ref 11.5–15.5)
WBC: 12.6 10*3/uL — ABNORMAL HIGH (ref 4.0–10.5)
nRBC: 0 % (ref 0.0–0.2)

## 2018-10-13 LAB — BASIC METABOLIC PANEL
Anion gap: 11 (ref 5–15)
BUN: 13 mg/dL (ref 6–20)
CO2: 28 mmol/L (ref 22–32)
Calcium: 9.4 mg/dL (ref 8.9–10.3)
Chloride: 97 mmol/L — ABNORMAL LOW (ref 98–111)
Creatinine, Ser: 1.35 mg/dL — ABNORMAL HIGH (ref 0.44–1.00)
GFR calc Af Amer: 52 mL/min — ABNORMAL LOW (ref >60)
GFR calc non Af Amer: 45 mL/min — ABNORMAL LOW (ref >60)
Glucose, Bld: 109 mg/dL — ABNORMAL HIGH (ref 70–99)
Potassium: 4.2 mmol/L (ref 3.5–5.1)
Sodium: 136 mmol/L (ref 135–145)

## 2018-10-13 LAB — HEPATIC FUNCTION PANEL
ALT: 12 U/L (ref 0–44)
AST: 17 U/L (ref 15–41)
Albumin: 3.7 g/dL (ref 3.5–5.0)
Alkaline Phosphatase: 84 U/L (ref 38–126)
Bilirubin, Direct: 0.1 mg/dL (ref 0.0–0.2)
Indirect Bilirubin: 0.9 mg/dL (ref 0.3–0.9)
Total Bilirubin: 1 mg/dL (ref 0.3–1.2)
Total Protein: 8.1 g/dL (ref 6.5–8.1)

## 2018-10-13 LAB — TROPONIN I (HIGH SENSITIVITY): Troponin I (High Sensitivity): 4 ng/L (ref <18)

## 2018-10-13 MED ORDER — MAGNESIUM OXIDE 400 (241.3 MG) MG PO TABS
400.0000 mg | ORAL_TABLET | Freq: Every day | ORAL | Status: DC
  Administered 2018-10-14: 11:00:00 400 mg via ORAL
  Filled 2018-10-13: qty 1

## 2018-10-13 MED ORDER — SODIUM CHLORIDE 0.9% FLUSH: 3.0000 mL | INTRAVENOUS | Status: DC | PRN

## 2018-10-13 MED ORDER — METHOCARBAMOL 500 MG PO TABS: 500.0000 mg | ORAL_TABLET | Freq: Three times a day (TID) | ORAL | Status: DC | PRN

## 2018-10-13 MED ORDER — ONDANSETRON HCL 4 MG/2ML IJ SOLN: 4.0000 mg | Freq: Four times a day (QID) | INTRAMUSCULAR | Status: DC | PRN

## 2018-10-13 MED ORDER — PANTOPRAZOLE SODIUM 40 MG PO TBEC
40.0000 mg | DELAYED_RELEASE_TABLET | Freq: Every day | ORAL | Status: DC
  Administered 2018-10-14: 40 mg via ORAL
  Filled 2018-10-13: qty 1

## 2018-10-13 MED ORDER — ONDANSETRON HCL 4 MG PO TABS: 4.0000 mg | ORAL_TABLET | Freq: Four times a day (QID) | ORAL | Status: DC | PRN

## 2018-10-13 MED ORDER — OXYCODONE HCL 5 MG PO TABS
10.0000 mg | ORAL_TABLET | Freq: Three times a day (TID) | ORAL | Status: DC | PRN
  Administered 2018-10-13: 10 mg via ORAL
  Filled 2018-10-13: qty 2

## 2018-10-13 MED ORDER — OSIMERTINIB MESYLATE 80 MG PO TABS: 80.0000 mg | ORAL_TABLET | Freq: Every day | ORAL | Status: DC

## 2018-10-13 MED ORDER — POTASSIUM CHLORIDE CRYS ER 20 MEQ PO TBCR
20.0000 meq | EXTENDED_RELEASE_TABLET | Freq: Two times a day (BID) | ORAL | Status: DC
  Administered 2018-10-13 – 2018-10-14 (×2): 20 meq via ORAL
  Filled 2018-10-13 (×2): qty 1

## 2018-10-13 MED ORDER — SODIUM CHLORIDE 0.9 % IV SOLN: 250.0000 mL | INTRAVENOUS | Status: DC | PRN

## 2018-10-13 MED ORDER — FUROSEMIDE 40 MG PO TABS
40.0000 mg | ORAL_TABLET | Freq: Every day | ORAL | Status: DC
  Administered 2018-10-13 – 2018-10-14 (×2): 40 mg via ORAL
  Filled 2018-10-13 (×2): qty 1

## 2018-10-13 MED ORDER — IOHEXOL 350 MG/ML SOLN
100.0000 mL | Freq: Once | INTRAVENOUS | Status: AC | PRN
  Administered 2018-10-13: 100 mL via INTRAVENOUS

## 2018-10-13 MED ORDER — SODIUM CHLORIDE 0.9% FLUSH
3.0000 mL | Freq: Two times a day (BID) | INTRAVENOUS | Status: DC
  Administered 2018-10-13 – 2018-10-14 (×2): 3 mL via INTRAVENOUS

## 2018-10-13 MED ORDER — SENNOSIDES-DOCUSATE SODIUM 8.6-50 MG PO TABS: 1.0000 | ORAL_TABLET | Freq: Every day | ORAL | Status: DC | PRN

## 2018-10-13 MED ORDER — SODIUM CHLORIDE 0.9% FLUSH
3.0000 mL | Freq: Once | INTRAVENOUS | Status: AC
  Administered 2018-10-13: 3 mL via INTRAVENOUS

## 2018-10-13 MED ORDER — SODIUM CHLORIDE (PF) 0.9 % IJ SOLN
INTRAMUSCULAR | Status: AC
  Administered 2018-10-13: 22:00:00
  Filled 2018-10-13: qty 50

## 2018-10-13 NOTE — ED Notes (Signed)
ED TO INPATIENT HANDOFF REPORT  Name/Age/Gender Nancy Moore 53 y.o. female  Code Status Code Status History    Date Active Date Inactive Code Status Order ID Comments User Context   01/13/2017 1430 01/22/2017 2145 Partial Code [id number]  Nancy Moore Inpatient   01/09/2017 2220 01/13/2017 1430 Full Code [id number]  Nancy Costa, MD ED   Advance Care Planning Activity    Questions for Most Recent Historical Code Status (Order [id number])    Question Answer Comment   In the event of cardiac or respiratory ARREST: Initiate Code Blue, Call Rapid Response Yes    In the event of cardiac or respiratory ARREST: Perform CPR No    In the event of cardiac or respiratory ARREST: Perform Intubation/Mechanical Ventilation Yes    In the event of cardiac or respiratory ARREST: Use NIPPV/BiPAp only if indicated Yes    In the event of cardiac or respiratory ARREST: Administer ACLS medications if indicated Yes    In the event of cardiac or respiratory ARREST: Perform Defibrillation or Cardioversion if indicated Yes       Home/SNF/Other Home  Chief Complaint cancer pt/ chest pain / SHOB   Level of Care/Admitting Diagnosis ED Disposition    ED Disposition Condition [place]: [place] [100102]  Level of Care: Telemetry [5]  Admit to tele based on following criteria: Other see comments  Comments: pleural effusion  Covid Evaluation: Asymptomatic Screening Protocol (No Symptoms)  Diagnosis: Pleural effusion [132440]  Admitting Physician: Toy Baker [[id number]]  Attending Physician: Toy Baker [[id number]]  PT Class (Do Not Modify): Observation [104]  PT Acc Code (Do Not Modify): Observation [10022]       Medical History Past Medical History:  Diagnosis Date  . Adenocarcinoma of left lung, stage 4 ([place]) 10/28/2016  . Goals of care, counseling/discussion 10/28/2016  . Hypertension     Allergies Allergies  Allergen Reactions   . Percocet [Oxycodone-Acetaminophen] Other (See Comments)    "makes me dizzy"  . Lisinopril Cough  . Losartan Cough    IV Location/Drains/Wounds Patient Lines/Drains/Airways Status   Active Line/Drains/Airways    Name:   Placement date:   Placement time:   Site:   Days:   Peripheral IV 10/13/18 Left Antecubital   10/13/18    1646    Antecubital   less than 1   Incision (Closed) 01/10/17 Chest Other (Comment)   01/10/17    2217     641   Incision (Closed) 01/10/17 Chest Right   01/10/17    2227     641          Labs/Imaging Results for orders placed or performed during the hospital encounter of 10/13/18 (from the past 48 hour(s))  Protime-INR     Status: Abnormal   Collection Time: 10/13/18  4:11 PM  Result Value Ref Range   Prothrombin Time 15.3 (H) 11.4 - 15.2 seconds   INR 1.2 0.8 - 1.2    Comment: (NOTE) INR goal varies based on device and disease states. Performed at [facility], [place] [address], [place], [place] [address]   Basic metabolic panel     Status: Abnormal   Collection Time: 10/13/18  4:45 PM  Result Value Ref Range   Sodium 136 135 - 145 mmol/L   Potassium 4.2 3.5 - 5.1 mmol/L   Chloride 97 (L) 98 - 111 mmol/L   CO2 28 22 - 32 mmol/L   Glucose, Bld  109 (H) 70 - 99 mg/dL   BUN 13 6 - 20 mg/dL   Creatinine, Ser 1.35 (H) 0.44 - 1.00 mg/dL   Calcium 9.4 8.9 - 10.3 mg/dL   GFR calc non Af Amer 45 (L) >60 mL/min   GFR calc Af Amer 52 (L) >60 mL/min   Anion gap 11 5 - 15    Comment: Performed at [facility], [place] [address] [place], [place] [address]  CBC     Status: Abnormal   Collection Time: 10/13/18  4:45 PM  Result Value Ref Range   WBC 12.6 (H) 4.0 - 10.5 K/uL   RBC 4.69 3.87 - 5.11 MIL/uL   Hemoglobin 13.5 12.0 - 15.0 g/dL   HCT 43.1 36.0 - 46.0 %   MCV 91.9 80.0 - 100.0 fL   MCH 28.8 26.0 - 34.0 pg   MCHC 31.3 30.0 - 36.0 g/dL   RDW 13.8 11.5 - 15.5 %   Platelets 315 150 - 400 K/uL   nRBC 0.0 0.0 - 0.2  %    Comment: Performed at [facility], [place] [address], [place], [place] [address]  Troponin I (High Sensitivity)     Status: None   Collection Time: 10/13/18  4:45 PM  Result Value Ref Range   Troponin I (High Sensitivity) 4 <18 ng/L    Comment: (NOTE) Elevated high sensitivity troponin I (hsTnI) values and significant  changes across serial measurements may suggest ACS but many other  chronic and acute conditions are known to elevate hsTnI results.  Refer to the "Links" section for chest pain algorithms and additional  guidance. Performed at [facility], [place] [address] [place], [place] [address]    Dg Chest 2 View  Result Date: 10/13/2018 CLINICAL DATA:  Chest pain short of breath EXAM: CHEST - 2 VIEW COMPARISON:  09/30/2018, CT 09/24/2018 FINDINGS: Large left-sided pleural effusion, increased compared to prior. Airspace disease in the left mid to lower lung. Mild ground-glass opacity in the left upper lobe. Patchy interstitial and ground-glass opacity in the right thorax. CT demonstrated pulmonary nodules are better seen on CT. The cardiomediastinal silhouette is enlarged. No pneumothorax. IMPRESSION: 1. Large left-sided pleural effusion, increased compared to prior, with increased airspace disease in the left mid to lower lung zone. 2. Interstitial and ground-glass opacity in the left upper lobe, and patchy interstitial and ground-glass opacity in the right thorax grossly similar as compared with prior chest CT. Electronically Signed   By: Donavan Foil M.D.   On: 10/13/2018 15:38   Ct Angio Chest Pe W And/or Wo Contrast  Result Date: 10/13/2018 CLINICAL DATA:  History of lung cancer. Now presents with acute dyspnea. High pretest probability for pulmonary embolus. EXAM: CT ANGIOGRAPHY CHEST WITH CONTRAST TECHNIQUE: Multidetector CT imaging of the chest was performed using the standard protocol during bolus administration of intravenous contrast.  Multiplanar CT image reconstructions and MIPs were obtained to evaluate the vascular anatomy. CONTRAST:  135mL OMNIPAQUE IOHEXOL 350 MG/ML SOLN COMPARISON:  CT chest 09/24/2018 FINDINGS: Cardiovascular: Exam detail is diminished due to respiratory motion artifact. The main pulmonary artery appears patent. No saddle embolus. No central obstructing pulmonary embolus identified. No definite lobar pulmonary artery embolus. Focal low-density filling defect within segmental branch of the left lower lobe pulmonary artery is noted, image 122/6 and image 70/8. Given the motion artifact this is equivocal for pulmonary artery embolus. Mediastinum/Nodes: Normal appearance of the thyroid gland. The trachea appears patent and is midline.  Normal appearance of the esophagus. No enlarged mediastinal or hilar lymph nodes. Lungs/Pleura: Large recurrent left pleural effusion is identified status post thoracentesis on 09/30/2018. Pleural effusion is larger than on the exam from 09/24/2018 and there is considerable volume loss throughout the left lung. Paramediastinal tumor within the left upper lobe with extension into the superior mediastinum is new from previous exam measuring 3.2 x 1.4 by 3.1 cm. Again noted is diffuse interstitial thickening and micro nodularity with ground-glass attenuation in the partially atelectatic left upper lobe, increased from previous exam. Septal thickening and micro nodularity throughout the right lung is also again noted compatible with metastatic disease. Previous index within the superior segment of right lower lobe measures 1.0 cm, image 37/11. Similar to previous study. The index nodule in the superior segment of left lower lobe measures 1 cm and is similar to prior exam. Upper Abdomen: No acute abnormality. Musculoskeletal: No chest wall abnormality. No acute or significant osseous findings. Review of the MIP images confirms the above findings. IMPRESSION: 1. Significantly diminished exam detail due  to diffuse respiratory motion artifact. This markedly diminishes evaluation of the lobar and segmental pulmonary arteries. No saddle embolus or central obstructing pulmonary artery embolus noted. Tiny low-density focus within a segmental branch of the right lower lobe pulmonary artery is equivocal for PE. 2. Significant increase in volume of left pleural effusion compared with 09/24/2018 with corresponding atelectasis of the left lower lobe and left upper lobe. 3. New subpleural mass within the paramediastinal left upper lobe with mediastinal invasion is noted concerning for progression of disease. 4. Similar appearance of diffuse micro nodularity and septal thickening compatible with metastatic disease. Lymphangitic spread of disease is not excluded. Electronically Signed   By: Kerby Moors M.D.   On: 10/13/2018 19:18    Pending Labs Unresulted Labs (From admission, onward)    Start     Ordered   10/13/18 2020  Hepatic function panel  Add-on,   AD     10/13/18 2019   10/13/18 1722  SARS CORONAVIRUS 2 (TAT 6-24 HRS) Nasopharyngeal Nasopharyngeal Swab  (Asymptomatic/Tier 2 Patients Labs)  Once,   STAT    Question Answer Comment  Is this test for diagnosis or screening Screening   Symptomatic for COVID-19 as defined by CDC No   Hospitalized for COVID-19 No   Admitted to ICU for COVID-19 No   Previously tested for COVID-19 Yes   Resident in a congregate (group) care setting No   Employed in healthcare setting No   Pregnant No      10/13/18 1721          Vitals/Pain Today's Vitals   10/13/18 1845 10/13/18 1945 10/13/18 2000 10/13/18 2015  BP: 139/75 (!) 123/53 123/76 121/78  Pulse: (!) 115 (!) 113 (!) 114 (!) 114  Resp: 19 17 18 18   Temp:      TempSrc:      SpO2: 94% 98% 97% 92%    Isolation Precautions No active isolations  Medications Medications  sodium chloride flush (NS) 0.9 % injection 3 mL (has no administration in time range)  sodium chloride (PF) 0.9 % injection (has  no administration in time range)  iohexol (OMNIPAQUE) 350 MG/ML injection 100 mL (100 mLs Intravenous Contrast Given 10/13/18 1747)    Mobility walks

## 2018-10-13 NOTE — ED Notes (Signed)
Per request of Dr. Tomi Bamberger, patient given sandwich, cheese stick, and water.

## 2018-10-13 NOTE — ED Triage Notes (Signed)
Patient reports to the ED with complaint of chest pain and SOB. Pt reports she has stage 4 lung and brain cancer. Patient reports she has been taking oral chemotherapy and she called her provider who told her to come here. Patient reports just walking to the bathroom is making her tachycardic.

## 2018-10-13 NOTE — ED Notes (Signed)
Unsuccessful IV attempt x1. 

## 2018-10-13 NOTE — Telephone Encounter (Signed)
Since Monday , pt having ongoing symptoms -pain w deep breath , coughing and increased heart rate and low oxygen sats measured with her  home equipment. Per Dr Julien Nordmann I instructed tp to go to ED .    Per managed care -pts insurance will not auth outpt CT angio to check for PE.

## 2018-10-13 NOTE — ED Provider Notes (Signed)
[facility] DEPT Provider Note   CSN: [id number] Arrival date & time: 10/13/18  1435     History   Chief Complaint Chief Complaint  Patient presents with  . Cancer  . Chest Pain    HPI Nancy Moore is a 53 y.o. female.     HPI Pt has history of stage 4 lung ca.  She had recent thoracentesis earlier this month.  Pt felt maybe a little better but shortly after the procedure she began having pain in her left chest.  She has been getting more short of breath especially since earlier this week.  She has been coughing a lot.  She has noticed her heart racing and any minimal activity is causing her to feel short of breath.    No fevers.  No leg swelling.   Pt called her oncologist office, Dr. Julien Nordmann,  per notes CT angio requested and pt sent to ED. Past Medical History:  Diagnosis Date  . Adenocarcinoma of left lung, stage 4 ([place]) 10/28/2016  . Goals of care, counseling/discussion 10/28/2016  . Hypertension     Patient Active Problem List   Diagnosis Date Noted  . Brain metastases ([place]) 05/13/2018  . Primary malignant neoplasm of lung with metastasis to brain ([place]) 02/05/2018  . Cardiac/pericardial tamponade   . Pain   . Acute blood loss anemia   . Palliative care encounter   . AKI (acute kidney injury) ([place]) 01/10/2017  . Hypotension 01/09/2017  . Essential hypertension 01/09/2017  . Hypokalemia 01/09/2017  . Dyspnea 01/09/2017  . Leukocytosis 01/09/2017  . Pleural effusion on right 01/08/2017  . Encounter for antineoplastic chemotherapy 11/20/2016  . Adenocarcinoma of left lung, stage 4 ([place]) 10/28/2016  . Goals of care, counseling/discussion 10/28/2016  . Upper airway cough syndrome 10/16/2016  . Multiple pulmonary nodules 10/15/2016    Past Surgical History:  Procedure Laterality Date  . BREAST EXCISIONAL BIOPSY Left 20+ yrs ago   benign  . PERICARDIAL WINDOW N/A 01/10/2017   Procedure: PERICARDIAL WINDOW- SUB XYPHOID, RIGHT  CHEST TUBE;  Surgeon: Ivin Poot, MD;  Location: [facility];  Service: Thoracic;  Laterality: N/A;  . VIDEO BRONCHOSCOPY Bilateral 10/21/2016   Procedure: VIDEO BRONCHOSCOPY WITH FLUORO;  Surgeon: Tanda Rockers, MD;  Location: [facility];  Service: Cardiopulmonary;  Laterality: Bilateral;     OB History   No obstetric history on file.      Home Medications    Prior to Admission medications   Medication Sig Start Date End Date Taking? Authorizing Provider  clindamycin (CLINDAGEL) 1 % gel APPLY TO THE AFFECTED AREA(S) TWICE DAILY Patient taking differently: Apply 1 application topically 2 (two) times daily.  09/24/18  Yes Copland, Gay Filler, MD  fluticasone (FLONASE) 50 MCG/ACT nasal spray Place 2 sprays into both nostrils daily.   Yes [provider]  furosemide (LASIX) 40 MG tablet TAKE 1 TABLET BY MOUTH ONCE DAILY Patient taking differently: Take 40 mg by mouth daily.  09/24/18  Yes Copland, Gay Filler, MD  loperamide (IMODIUM) 1 MG/5ML solution Take 2 mg as needed by mouth for diarrhea or loose stools.   Yes [provider]  magnesium oxide (MAG-OX) 400 (241.3 Mg) MG tablet Take 1 tablet (400 mg total) by mouth daily. 01/23/17  Yes Rama, Venetia Maxon, MD  methocarbamol (ROBAXIN) 500 MG tablet TAKE 1 TABLET (500 MG TOTAL) BY MOUTH EVERY 8 (EIGHT) HOURS AS NEEDED FOR MUSCLE SPASMS. 01/18/18  Yes Copland, Gay Filler, MD  nystatin  cream (MYCOSTATIN) Apply 1 application topically 2 (two) times daily. Patient taking differently: Apply 1 application topically daily as needed for dry skin.  06/04/18  Yes Saguier, Percell Miller, PA-C  Oxycodone HCl 10 MG TABS TAKE 1 TABLET BY MOUTH 3 TIMES DAILY AS NEEDED Patient taking differently: Take 10 mg by mouth 3 (three) times daily as needed (pain).  09/24/18  Yes Copland, Gay Filler, MD  pantoprazole (PROTONIX) 40 MG tablet TAKE 1 TABLET (40 MG TOTAL) BY MOUTH DAILY AT 12 NOON. Patient taking differently: Take 40 mg by mouth daily.  08/25/18  Yes  Copland, Gay Filler, MD  potassium chloride SA (K-DUR) 20 MEQ tablet TAKE 1 TABLET (20 MEQ TOTAL) BY MOUTH 2 TIMES DAILY. Patient taking differently: Take 20 mEq by mouth 2 (two) times daily.  08/25/18  Yes Copland, Gay Filler, MD  prochlorperazine (COMPAZINE) 10 MG tablet Take 1 tablet (10 mg total) every 6 (six) hours as needed by mouth for nausea or vomiting. 12/05/16  Yes Curcio, Roselie Awkward, NP  senna-docusate (SENOKOT-S) 8.6-50 MG tablet Take 1 tablet by mouth at bedtime. Patient taking differently: Take 1 tablet by mouth daily as needed for mild constipation.  01/22/17  Yes Rama, Venetia Maxon, MD  TAGRISSO 80 MG tablet TAKE 1 TABLET BY MOUTH ONCE DAILY Patient taking differently: Take 80 mg by mouth daily.  09/13/18  Yes Curt Bears, MD  ammonium lactate (LAC-HYDRIN) 12 % lotion Apply topically as needed for dry skin. Patient not taking: Reported on 10/13/2018 08/19/17   Copland, Gay Filler, MD  doxycycline (VIBRA-TABS) 100 MG tablet Take 1 tablet (100 mg total) by mouth 2 (two) times daily. Can give caps or generic Patient not taking: Reported on 08/24/2018 06/04/18   Saguier, Percell Miller, PA-C    Family History Family History  Problem Relation Age of Onset  . Asthma Mother   . Stroke Mother   . Hypertension Mother   . Heart attack Father   . Hypertension Father   . Hyperlipidemia Father   . Dementia Father   . Emphysema Maternal Grandmother   . Hypertension Maternal Grandmother   . Colon cancer Paternal Grandmother   . Brain cancer Maternal Uncle     Social History Social History   Tobacco Use  . Smoking status: Never Smoker  . Smokeless tobacco: Never Used  Substance Use Topics  . Alcohol use: Yes    Comment: socially  . Drug use: No     Allergies   Percocet [oxycodone-acetaminophen], Lisinopril, and Losartan   Review of Systems Review of Systems  All other systems reviewed and are negative.    Physical Exam Updated Vital Signs BP 139/75   Pulse (!) 115   Temp  99.1 F (37.3 C) (Oral)   Resp 19   LMP 03/15/2010   SpO2 94%   Physical Exam Vitals signs and nursing note reviewed.  Constitutional:      General: She is not in acute distress.    Appearance: She is well-developed.  HENT:     Head: Normocephalic and atraumatic.     Right Ear: External ear normal.     Left Ear: External ear normal.  Eyes:     General: No scleral icterus.       Right eye: No discharge.        Left eye: No discharge.     Conjunctiva/sclera: Conjunctivae normal.  Neck:     Musculoskeletal: Neck supple.     Trachea: No tracheal deviation.  Cardiovascular:  Rate and Rhythm: Normal rate and regular rhythm.  Pulmonary:     Effort: Pulmonary effort is normal. No respiratory distress.     Breath sounds: No stridor. Examination of the left-middle field reveals decreased breath sounds. Examination of the left-lower field reveals decreased breath sounds. Decreased breath sounds present. No wheezing or rales.  Abdominal:     General: Bowel sounds are normal. There is no distension.     Palpations: Abdomen is soft.     Tenderness: There is no abdominal tenderness. There is no guarding or rebound.  Musculoskeletal:        General: No tenderness.  Skin:    General: Skin is warm and dry.     Findings: No rash.  Neurological:     Mental Status: She is alert.     Cranial Nerves: No cranial nerve deficit (no facial droop, extraocular movements intact, no slurred speech).     Sensory: No sensory deficit.     Motor: No abnormal muscle tone or seizure activity.     Coordination: Coordination normal.      ED Treatments / Results  Labs (all labs ordered are listed, but only abnormal results are displayed) Labs Reviewed  BASIC METABOLIC PANEL - Abnormal; Notable for the following components:      Result Value   Chloride 97 (*)    Glucose, Bld 109 (*)    Creatinine, Ser 1.35 (*)    GFR calc non Af Amer 45 (*)    GFR calc Af Amer 52 (*)    All other components  within normal limits  CBC - Abnormal; Notable for the following components:   WBC 12.6 (*)    All other components within normal limits  PROTIME-INR - Abnormal; Notable for the following components:   Prothrombin Time 15.3 (*)    All other components within normal limits  SARS CORONAVIRUS 2 (TAT 6-24 HRS)  TROPONIN I (HIGH SENSITIVITY)    EKG EKG Interpretation  Date/Time:  Wednesday October 13 2018 15:00:28 EDT Ventricular Rate:  116 PR Interval:    QRS Duration: 80 QT Interval:  343 QTC Calculation: 477 R Axis:   32 Text Interpretation:  Sinus tachycardia Atrial premature complex Probable left atrial enlargement Low voltage, precordial leads RSR' in V1 or V2, probably normal variant Borderline T abnormalities, diffuse leads , new since last tracing Confirmed by Dorie Rank [phone]) on 10/13/2018 4:16:36 PM   Radiology Dg Chest 2 View  Result Date: 10/13/2018 CLINICAL DATA:  Chest pain short of breath EXAM: CHEST - 2 VIEW COMPARISON:  09/30/2018, CT 09/24/2018 FINDINGS: Large left-sided pleural effusion, increased compared to prior. Airspace disease in the left mid to lower lung. Mild ground-glass opacity in the left upper lobe. Patchy interstitial and ground-glass opacity in the right thorax. CT demonstrated pulmonary nodules are better seen on CT. The cardiomediastinal silhouette is enlarged. No pneumothorax. IMPRESSION: 1. Large left-sided pleural effusion, increased compared to prior, with increased airspace disease in the left mid to lower lung zone. 2. Interstitial and ground-glass opacity in the left upper lobe, and patchy interstitial and ground-glass opacity in the right thorax grossly similar as compared with prior chest CT. Electronically Signed   By: Donavan Foil M.D.   On: 10/13/2018 15:38   Ct Angio Chest Pe W And/or Wo Contrast  Result Date: 10/13/2018 CLINICAL DATA:  History of lung cancer. Now presents with acute dyspnea. High pretest probability for pulmonary  embolus. EXAM: CT ANGIOGRAPHY CHEST WITH CONTRAST TECHNIQUE: Multidetector CT imaging  of the chest was performed using the standard protocol during bolus administration of intravenous contrast. Multiplanar CT image reconstructions and MIPs were obtained to evaluate the vascular anatomy. CONTRAST:  150m OMNIPAQUE IOHEXOL 350 MG/ML SOLN COMPARISON:  CT chest 09/24/2018 FINDINGS: Cardiovascular: Exam detail is diminished due to respiratory motion artifact. The main pulmonary artery appears patent. No saddle embolus. No central obstructing pulmonary embolus identified. No definite lobar pulmonary artery embolus. Focal low-density filling defect within segmental branch of the left lower lobe pulmonary artery is noted, image 122/6 and image 70/8. Given the motion artifact this is equivocal for pulmonary artery embolus. Mediastinum/Nodes: Normal appearance of the thyroid gland. The trachea appears patent and is midline. Normal appearance of the esophagus. No enlarged mediastinal or hilar lymph nodes. Lungs/Pleura: Large recurrent left pleural effusion is identified status post thoracentesis on 09/30/2018. Pleural effusion is larger than on the exam from 09/24/2018 and there is considerable volume loss throughout the left lung. Paramediastinal tumor within the left upper lobe with extension into the superior mediastinum is new from previous exam measuring 3.2 x 1.4 by 3.1 cm. Again noted is diffuse interstitial thickening and micro nodularity with ground-glass attenuation in the partially atelectatic left upper lobe, increased from previous exam. Septal thickening and micro nodularity throughout the right lung is also again noted compatible with metastatic disease. Previous index within the superior segment of right lower lobe measures 1.0 cm, image 37/11. Similar to previous study. The index nodule in the superior segment of left lower lobe measures 1 cm and is similar to prior exam. Upper Abdomen: No acute abnormality.  Musculoskeletal: No chest wall abnormality. No acute or significant osseous findings. Review of the MIP images confirms the above findings. IMPRESSION: 1. Significantly diminished exam detail due to diffuse respiratory motion artifact. This markedly diminishes evaluation of the lobar and segmental pulmonary arteries. No saddle embolus or central obstructing pulmonary artery embolus noted. Tiny low-density focus within a segmental branch of the right lower lobe pulmonary artery is equivocal for PE. 2. Significant increase in volume of left pleural effusion compared with 09/24/2018 with corresponding atelectasis of the left lower lobe and left upper lobe. 3. New subpleural mass within the paramediastinal left upper lobe with mediastinal invasion is noted concerning for progression of disease. 4. Similar appearance of diffuse micro nodularity and septal thickening compatible with metastatic disease. Lymphangitic spread of disease is not excluded. Electronically Signed   By: TKerby MoorsM.D.   On: 10/13/2018 19:18    Procedures Procedures (including critical care time)  Medications Ordered in ED Medications  sodium chloride flush (NS) 0.9 % injection 3 mL (has no administration in time range)  sodium chloride (PF) 0.9 % injection (has no administration in time range)  iohexol (OMNIPAQUE) 350 MG/ML injection 100 mL (100 mLs Intravenous Contrast Given 10/13/18 1747)     Initial Impression / Assessment and Plan / ED Course  I have reviewed the triage vital signs and the nursing notes.  Pertinent labs & imaging results that were available during my care of the patient were reviewed by me and considered in my medical decision making (see chart for details).  Clinical Course as of Oct 12 1944  Wed Oct 13, 2018  1945 Laboratory tests are unremarkable.  Initial chest x-ray shows worsening pleural effusion.  Patient CT scan confirms worsening pleural effusion without evidence of pulmonary embolism.   Patient also has new mass noted in the left side concerning for progression of her lung cancer.   [[LN]  1945 Findings discussed with patient and family.   [JK]    Clinical Course User Index [JK] Dorie Rank, MD     Patient presents with progressive shortness of breath.  Patient has no malignancy.  Concern was for possible pulmonary embolism.  Chest x-ray however did show recurrence of her pleural effusion.  Patient just had a thoracentesis performed 2 weeks ago for the same issue.  Patient will need another thoracentesis.  She currently however is stable and is not in any respiratory distress.  She however may need a drain considering her recurrence and worsening malignancy.  I will consult the medical service for admission to see if we can arrange for thoracentesis tomorrow as well as consultation with her oncologist.   Final Clinical Impressions(s) / ED Diagnoses   Final diagnoses:  Malignant pleural effusion  Malignant neoplasm of left lung, unspecified part of lung [facility])    ED Discharge Orders    None       Dorie Rank, MD 10/13/18 1951

## 2018-10-13 NOTE — H&P (Signed)
Nancy Moore UDJ:[id number] DOB: [birth year] DOA: 10/13/2018     PCP: Darreld Mclean, MD   Outpatient Specialists:     Oncology Dr. Julien Nordmann  CT surgery [facility] Patient arrived to ER on 10/13/18 at 13  Patient coming from: home Lives alone,     Chief Complaint:   Chief Complaint  Patient presents with   Cancer   Chest Pain    HPI: Nancy Moore is a 53 y.o. female with medical history significant of Stage IV non-small cell lung cancer, recurrent malignant pleural effusion, HTN hx of pericardial effusion sp Pericardial Window    Presented with   Worsening shortness of breath, chest pain similar to prior complains of pleural effusion and brain metastasis. No fever or chills.  Sp U guided thoraocentesis on 09/30/2018  She was using oxycodone for the pain and cough and it helped   Infectious risk factors:  Reports shortness of breath,  In  ER RAPID COVID TEST   in house testing  Pending  Lab Results  Component Value Date   [place] 09/27/2018     Regarding pertinent Chronic problems:   Hx of Lung cancer on   treatment with Tagrisso fairly, repeat MRI of the brain that showed further improvement in the brain metastasis.      HTN on Lasix    - last echo  01/11/2017  EF 60% -   65% no evidence of CHF   CKD stage III - baseline Cr 1.3 Lab Results  Component Value Date   CREATININE 1.35 (H) 10/13/2018   CREATININE 1.43 (H) 09/24/2018   CREATININE 1.31 (H) 08/24/2018     While in ER: CT showed enlarging pleural effusion no evidence of PE The following Work up has been ordered so far:  Orders Placed This Encounter  Procedures   SARS CORONAVIRUS 2 (TAT 6-24 HRS) Nasopharyngeal Nasopharyngeal Swab   DG Chest 2 View   CT Angio Chest PE W and/or Wo Contrast   Basic metabolic panel   CBC   Protime-INR   Cardiac monitoring   Saline Lock IV, Maintain IV access   Consult to hospitalist  ALL PATIENTS BEING ADMITTED/HAVING PROCEDURES NEED  COVID-19 SCREENING   Consult to hospitalist  ALL PATIENTS BEING ADMITTED/HAVING PROCEDURES NEED COVID-19 SCREENING   Pulse oximetry, continuous   EKG 12-Lead   ED EKG     Following Medications were ordered in ER: Medications  sodium chloride flush (NS) 0.9 % injection 3 mL (has no administration in time range)  sodium chloride (PF) 0.9 % injection (has no administration in time range)  iohexol (OMNIPAQUE) 350 MG/ML injection 100 mL (100 mLs Intravenous Contrast Given 10/13/18 1747)        Consult Orders  (From admission, onward)         Start     Ordered   10/13/18 1938  Consult to hospitalist  ALL PATIENTS BEING ADMITTED/HAVING PROCEDURES NEED COVID-19 SCREENING  Once    Comments: ALL PATIENTS BEING ADMITTED/HAVING PROCEDURES NEED COVID-19 SCREENING  Provider:  (Not yet assigned)  Question Answer Comment  Place call to: [facility]   Reason for Consult Admit      10/13/18 1938   10/13/18 1929  Consult to hospitalist  ALL PATIENTS BEING ADMITTED/HAVING PROCEDURES NEED COVID-19 SCREENING  Once    Comments: ALL PATIENTS BEING ADMITTED/HAVING PROCEDURES NEED COVID-19 SCREENING  Provider:  (Not yet assigned)  Question Answer Comment  Place call to: [facility]   Reason for Consult  Admit      10/13/18 1943            Significant initial  Findings: Abnormal Labs Reviewed  BASIC METABOLIC PANEL - Abnormal; Notable for the following components:      Result Value   Chloride 97 (*)    Glucose, Bld 109 (*)    Creatinine, Ser 1.35 (*)    GFR calc non Af Amer 45 (*)    GFR calc Af Amer 52 (*)    All other components within normal limits  CBC - Abnormal; Notable for the following components:   WBC 12.6 (*)    All other components within normal limits  PROTIME-INR - Abnormal; Notable for the following components:   Prothrombin Time 15.3 (*)    All other components within normal limits     Otherwise labs showing:    Recent Labs  Lab 10/13/18 1645  NA  136  K 4.2  CO2 28  GLUCOSE 109*  BUN 13  CREATININE 1.35*  CALCIUM 9.4    Cr    stable,   Lab Results  Component Value Date   CREATININE 1.35 (H) 10/13/2018   CREATININE 1.43 (H) 09/24/2018   CREATININE 1.31 (H) 08/24/2018    No results for input(s): AST, ALT, ALKPHOS, BILITOT, PROT, ALBUMIN in the last 168 hours. Lab Results  Component Value Date   CALCIUM 9.4 10/13/2018      WBC      Component Value Date/Time   WBC 12.6 (H) 10/13/2018 1645   ANC    Component Value Date/Time   NEUTROABS 7.1 09/24/2018 0830   NEUTROABS 7.6 (H) 01/08/2017 1055   ALC No components found for: LYMPHAB    Plt: Lab Results  Component Value Date   PLT 315 10/13/2018       COVID-19 Labs     Lab Results  Component Value Date   Waterflow NEGATIVE 09/27/2018    HG/HCT  stable,       Component Value Date/Time   HGB 13.5 10/13/2018 1645   HGB 12.9 09/24/2018 0830   HGB 10.2 (L) 01/08/2017 1055   HCT 43.1 10/13/2018 1645   HCT 31.8 (L) 01/08/2017 1055    ECG: Ordered Personally reviewed by me showing: HR : 116 Rhythm:  Sinus tachycardia    no evidence of ischemic changes QTC 477    UA  not ordered        CXR - worsening pleural effussion    CTA chest -   nonacute, no PE, worsening pleural effusion and metastatic disease      ED Triage Vitals [10/13/18 1457]  Enc Vitals Group     BP (!) 135/107     Pulse Rate (!) 117     Resp 18     Temp 99.1 F (37.3 C)     Temp Source Oral     SpO2 97 %     Weight      Height      Head Circumference      Peak Flow      Pain Score      Pain Loc      Pain Edu?      Excl. in [place]?   TIRW(43)@       Latest  Blood pressure 123/76, pulse (!) 114, temperature 99.1 F (37.3 C), temperature source Oral, resp. rate 18, last menstrual period 03/15/2010, SpO2 97 %.    Hospitalist was called for admission for  Left side effusion  Review of Systems:    Pertinent positives include:  dyspnea on exertion, shortness of  breath at rest chest pain,  Constitutional:  No weight loss, night sweats, Fevers, chills, fatigue, weight loss  HEENT:  No headaches, Difficulty swallowing,Tooth/dental problems,Sore throat,  No sneezing, itching, ear ache, nasal congestion, post nasal drip,  Cardio-vascular:  NoOrthopnea, PND, anasarca, dizziness, palpitations.no Bilateral lower extremity swelling  GI:  No heartburn, indigestion, abdominal pain, nausea, vomiting, diarrhea, change in bowel habits, loss of appetite, melena, blood in stool, hematemesis Resp:   No  No excess mucus, no productive cough, No non-productive cough, No coughing up of blood.No change in color of mucus.No wheezing. Skin:  no rash or lesions. No jaundice GU:  no dysuria, change in color of urine, no urgency or frequency. No straining to urinate.  No flank pain.  Musculoskeletal:  No joint pain or no joint swelling. No decreased range of motion. No back pain.  Psych:  No change in mood or affect. No depression or anxiety. No memory loss.  Neuro: no localizing neurological complaints, no tingling, no weakness, no double vision, no gait abnormality, no slurred speech, no confusion  All systems reviewed and apart from [place] all are negative  Past Medical History:   Past Medical History:  Diagnosis Date   Adenocarcinoma of left lung, stage 4 ([place]) 10/28/2016   Goals of care, counseling/discussion 10/28/2016   Hypertension       Past Surgical History:  Procedure Laterality Date   BREAST EXCISIONAL BIOPSY Left 20+ yrs ago   benign   PERICARDIAL WINDOW N/A 01/10/2017   Procedure: PERICARDIAL WINDOW- SUB XYPHOID, RIGHT CHEST TUBE;  Surgeon: Ivin Poot, MD;  Location: [facility];  Service: Thoracic;  Laterality: N/A;   VIDEO BRONCHOSCOPY Bilateral 10/21/2016   Procedure: VIDEO BRONCHOSCOPY WITH FLUORO;  Surgeon: Tanda Rockers, MD;  Location: [facility];  Service: Cardiopulmonary;  Laterality: Bilateral;    Social History:  Ambulatory    independently       reports that she has never smoked. She has never used smokeless tobacco. She reports current alcohol use. She reports that she does not use drugs.   Family History:   Family History  Problem Relation Age of Onset   Asthma Mother    Stroke Mother    Hypertension Mother    Heart attack Father    Hypertension Father    Hyperlipidemia Father    Dementia Father    Emphysema Maternal Grandmother    Hypertension Maternal Grandmother    Colon cancer Paternal Grandmother    Brain cancer Maternal Uncle     Allergies: Allergies  Allergen Reactions   Percocet [Oxycodone-Acetaminophen] Other (See Comments)    "makes me dizzy"   Lisinopril Cough   Losartan Cough     Prior to Admission medications   Medication Sig Start Date End Date Taking? Authorizing Provider  clindamycin (CLINDAGEL) 1 % gel APPLY TO THE AFFECTED AREA(S) TWICE DAILY Patient taking differently: Apply 1 application topically 2 (two) times daily.  09/24/18  Yes Copland, Gay Filler, MD  fluticasone (FLONASE) 50 MCG/ACT nasal spray Place 2 sprays into both nostrils daily.   Yes [provider]  furosemide (LASIX) 40 MG tablet TAKE 1 TABLET BY MOUTH ONCE DAILY Patient taking differently: Take 40 mg by mouth daily.  09/24/18  Yes Copland, Gay Filler, MD  loperamide (IMODIUM) 1 MG/5ML solution Take 2 mg as needed by mouth for diarrhea or loose stools.   Yes [provider]  magnesium oxide (MAG-OX) 400 (241.3 Mg) MG tablet Take 1 tablet (400 mg total) by mouth daily. 01/23/17  Yes Rama, Venetia Maxon, MD  methocarbamol (ROBAXIN) 500 MG tablet TAKE 1 TABLET (500 MG TOTAL) BY MOUTH EVERY 8 (EIGHT) HOURS AS NEEDED FOR MUSCLE SPASMS. 01/18/18  Yes Copland, Gay Filler, MD  nystatin cream (MYCOSTATIN) Apply 1 application topically 2 (two) times daily. Patient taking differently: Apply 1 application topically daily as needed for dry skin.  06/04/18  Yes Saguier, Percell Miller, PA-C  Oxycodone HCl  10 MG TABS TAKE 1 TABLET BY MOUTH 3 TIMES DAILY AS NEEDED Patient taking differently: Take 10 mg by mouth 3 (three) times daily as needed (pain).  09/24/18  Yes Copland, Gay Filler, MD  pantoprazole (PROTONIX) 40 MG tablet TAKE 1 TABLET (40 MG TOTAL) BY MOUTH DAILY AT 12 NOON. Patient taking differently: Take 40 mg by mouth daily.  08/25/18  Yes Copland, Gay Filler, MD  potassium chloride SA (K-DUR) 20 MEQ tablet TAKE 1 TABLET (20 MEQ TOTAL) BY MOUTH 2 TIMES DAILY. Patient taking differently: Take 20 mEq by mouth 2 (two) times daily.  08/25/18  Yes Copland, Gay Filler, MD  prochlorperazine (COMPAZINE) 10 MG tablet Take 1 tablet (10 mg total) every 6 (six) hours as needed by mouth for nausea or vomiting. 12/05/16  Yes Curcio, Roselie Awkward, NP  senna-docusate (SENOKOT-S) 8.6-50 MG tablet Take 1 tablet by mouth at bedtime. Patient taking differently: Take 1 tablet by mouth daily as needed for mild constipation.  01/22/17  Yes Rama, Venetia Maxon, MD  TAGRISSO 80 MG tablet TAKE 1 TABLET BY MOUTH ONCE DAILY Patient taking differently: Take 80 mg by mouth daily.  09/13/18  Yes Curt Bears, MD  ammonium lactate (LAC-HYDRIN) 12 % lotion Apply topically as needed for dry skin. Patient not taking: Reported on 10/13/2018 08/19/17   Copland, Gay Filler, MD  doxycycline (VIBRA-TABS) 100 MG tablet Take 1 tablet (100 mg total) by mouth 2 (two) times daily. Can give caps or generic Patient not taking: Reported on 08/24/2018 06/04/18   Saguier, Percell Miller, PA-C   Physical Exam: Blood pressure 123/76, pulse (!) 114, temperature 99.1 F (37.3 C), temperature source Oral, resp. rate 18, last menstrual period 03/15/2010, SpO2 97 %. 1. General:  in No  Acute distress   Chronically ill -appearing 2. Psychological: Alert and   Oriented 3. Head/ENT:   Dry Mucous Membranes                          Head Non traumatic, neck supple                           Poor Dentition 4. SKIN:  decreased Skin turgor,  Skin clean Dry and intact no  rash 5. Heart: Regular rate and rhythm no Murmur, no Rub or gallop 6. Lungs:  no wheezes or crackles  Diminished at the bases 7. Abdomen: Soft,  non-tender, Non distended   8. Lower extremities: no clubbing, cyanosis, no  edema 9. Neurologically Grossly intact, moving all 4 extremities equally   10. MSK: Normal range of motion   All other LABS:     Recent Labs  Lab 10/13/18 1645  WBC 12.6*  HGB 13.5  HCT 43.1  MCV 91.9  PLT 315     Recent Labs  Lab 10/13/18 1645  NA 136  K 4.2  CL 97*  CO2 28  GLUCOSE 109*  BUN  13  CREATININE 1.35*  CALCIUM 9.4     No results for input(s): AST, ALT, ALKPHOS, BILITOT, PROT, ALBUMIN in the last 168 hours.     Cultures:    Component Value Date/Time   SDES BLOOD LEFT ANTECUBITAL 06/08/2017 2134   SPECREQUEST  06/08/2017 2134    BOTTLES DRAWN AEROBIC AND ANAEROBIC Blood Culture adequate volume   CULT  06/08/2017 2134    NO GROWTH 5 DAYS Performed at [facility], [place] [address] [place], [place] [address]    REPTSTATUS 06/13/2017 FINAL 06/08/2017 2134     Radiological Exams on Admission: Dg Chest 2 View  Result Date: 10/13/2018 CLINICAL DATA:  Chest pain short of breath EXAM: CHEST - 2 VIEW COMPARISON:  09/30/2018, CT 09/24/2018 FINDINGS: Large left-sided pleural effusion, increased compared to prior. Airspace disease in the left mid to lower lung. Mild ground-glass opacity in the left upper lobe. Patchy interstitial and ground-glass opacity in the right thorax. CT demonstrated pulmonary nodules are better seen on CT. The cardiomediastinal silhouette is enlarged. No pneumothorax. IMPRESSION: 1. Large left-sided pleural effusion, increased compared to prior, with increased airspace disease in the left mid to lower lung zone. 2. Interstitial and ground-glass opacity in the left upper lobe, and patchy interstitial and ground-glass opacity in the right thorax grossly similar as compared with prior chest CT. Electronically Signed    By: Donavan Foil M.D.   On: 10/13/2018 15:38   Ct Angio Chest Pe W And/or Wo Contrast  Result Date: 10/13/2018 CLINICAL DATA:  History of lung cancer. Now presents with acute dyspnea. High pretest probability for pulmonary embolus. EXAM: CT ANGIOGRAPHY CHEST WITH CONTRAST TECHNIQUE: Multidetector CT imaging of the chest was performed using the standard protocol during bolus administration of intravenous contrast. Multiplanar CT image reconstructions and MIPs were obtained to evaluate the vascular anatomy. CONTRAST:  181m OMNIPAQUE IOHEXOL 350 MG/ML SOLN COMPARISON:  CT chest 09/24/2018 FINDINGS: Cardiovascular: Exam detail is diminished due to respiratory motion artifact. The main pulmonary artery appears patent. No saddle embolus. No central obstructing pulmonary embolus identified. No definite lobar pulmonary artery embolus. Focal low-density filling defect within segmental branch of the left lower lobe pulmonary artery is noted, image 122/6 and image 70/8. Given the motion artifact this is equivocal for pulmonary artery embolus. Mediastinum/Nodes: Normal appearance of the thyroid gland. The trachea appears patent and is midline. Normal appearance of the esophagus. No enlarged mediastinal or hilar lymph nodes. Lungs/Pleura: Large recurrent left pleural effusion is identified status post thoracentesis on 09/30/2018. Pleural effusion is larger than on the exam from 09/24/2018 and there is considerable volume loss throughout the left lung. Paramediastinal tumor within the left upper lobe with extension into the superior mediastinum is new from previous exam measuring 3.2 x 1.4 by 3.1 cm. Again noted is diffuse interstitial thickening and micro nodularity with ground-glass attenuation in the partially atelectatic left upper lobe, increased from previous exam. Septal thickening and micro nodularity throughout the right lung is also again noted compatible with metastatic disease. Previous index within the superior  segment of right lower lobe measures 1.0 cm, image 37/11. Similar to previous study. The index nodule in the superior segment of left lower lobe measures 1 cm and is similar to prior exam. Upper Abdomen: No acute abnormality. Musculoskeletal: No chest wall abnormality. No acute or significant osseous findings. Review of the MIP images confirms the above findings. IMPRESSION: 1. Significantly diminished exam detail due to diffuse respiratory motion artifact. This markedly diminishes evaluation of  the lobar and segmental pulmonary arteries. No saddle embolus or central obstructing pulmonary artery embolus noted. Tiny low-density focus within a segmental branch of the right lower lobe pulmonary artery is equivocal for PE. 2. Significant increase in volume of left pleural effusion compared with 09/24/2018 with corresponding atelectasis of the left lower lobe and left upper lobe. 3. New subpleural mass within the paramediastinal left upper lobe with mediastinal invasion is noted concerning for progression of disease. 4. Similar appearance of diffuse micro nodularity and septal thickening compatible with metastatic disease. Lymphangitic spread of disease is not excluded. Electronically Signed   By: Kerby Moors M.D.   On: 10/13/2018 19:18    Chart has been reviewed  53 y.o. female with medical history significant of Stage IV non-small cell lung cancer, recurrent malignant pleural effusion, HTN hx of pericardial effusion sp Pericardial Window      Admitted for recurrent pleural effusion Assessment/Plan   Present on Admission:  Pleural effusion - order IR consult for thoracentesis, will need discussion if would benefit from Pleurex catheter   Adenocarcinoma of left lung, stage 4 ([place]) - continue home meds, email Dr. Julien Nordmann  Essential hypertension - stable continue home meds    Leukocytosis - chronic no evidence of infection at this time   Primary malignant neoplasm of lung with metastasis to brain  ([place])  Tachycardia - monitor on telemetry, suspect due to pleural effusion no evidence of PE Check TSH  Other plan as per orders.  DVT prophylaxis:  SCD  Code Status:  No compression Limitted   I had spent 34mn discussing goals of care and CODE STATUS  Family Communication:   Family    at  Bedside  plan of care was discussed  with Sister   Disposition Plan:    To home once workup is complete and patient is stable                                           Consults called:  email Dr MJulien Nordmann IR consulted  Admission status:  ED Disposition    ED Disposition Condition Comment   Admit  The patient appears reasonably stabilized for admission considering the current resources, flow, and capabilities available in the ED at this time, and I doubt any other [facility] further screening and/or treatment in the ED prior to admission is  present.       Obs      Level of care   tele  For 12H    Precautions:  No active isolations  PPE: Used by the provider:   P100  eye Goggles,  Gloves    Davidlee Jeanbaptiste 10/13/2018, 10:17 PM    [facility]    after 2 AM please page floor coverage PA If 7AM-7PM, please contact the day team taking care of the patient using [email or web address]

## 2018-10-13 NOTE — Telephone Encounter (Signed)
She is taking it easy today and taking her oxycodone 10 mg prn for pain with deep breath and cough. CT angio not yet scheduled I sent  A message to managed care .

## 2018-10-14 ENCOUNTER — Other Ambulatory Visit: Payer: Self-pay | Admitting: Oncology

## 2018-10-14 ENCOUNTER — Encounter (HOSPITAL_COMMUNITY): Payer: Self-pay

## 2018-10-14 ENCOUNTER — Observation Stay (HOSPITAL_COMMUNITY): Payer: 59

## 2018-10-14 DIAGNOSIS — D72829 Elevated white blood cell count, unspecified: Secondary | ICD-10-CM | POA: Diagnosis not present

## 2018-10-14 DIAGNOSIS — I10 Essential (primary) hypertension: Secondary | ICD-10-CM | POA: Diagnosis not present

## 2018-10-14 DIAGNOSIS — C3490 Malignant neoplasm of unspecified part of unspecified bronchus or lung: Secondary | ICD-10-CM

## 2018-10-14 DIAGNOSIS — J90 Pleural effusion, not elsewhere classified: Secondary | ICD-10-CM

## 2018-10-14 DIAGNOSIS — C3492 Malignant neoplasm of unspecified part of left bronchus or lung: Secondary | ICD-10-CM | POA: Diagnosis not present

## 2018-10-14 DIAGNOSIS — C7931 Secondary malignant neoplasm of brain: Secondary | ICD-10-CM

## 2018-10-14 LAB — CBC
HCT: 37.9 % (ref 36.0–46.0)
Hemoglobin: 11.9 g/dL — ABNORMAL LOW (ref 12.0–15.0)
MCH: 29 pg (ref 26.0–34.0)
MCHC: 31.4 g/dL (ref 30.0–36.0)
MCV: 92.2 fL (ref 80.0–100.0)
Platelets: 295 10*3/uL (ref 150–400)
RBC: 4.11 MIL/uL (ref 3.87–5.11)
RDW: 13.8 % (ref 11.5–15.5)
WBC: 10.9 10*3/uL — ABNORMAL HIGH (ref 4.0–10.5)
nRBC: 0 % (ref 0.0–0.2)

## 2018-10-14 LAB — COMPREHENSIVE METABOLIC PANEL
ALT: 10 U/L (ref 0–44)
AST: 16 U/L (ref 15–41)
Albumin: 3.1 g/dL — ABNORMAL LOW (ref 3.5–5.0)
Alkaline Phosphatase: 69 U/L (ref 38–126)
Anion gap: 10 (ref 5–15)
BUN: 12 mg/dL (ref 6–20)
CO2: 24 mmol/L (ref 22–32)
Calcium: 8.7 mg/dL — ABNORMAL LOW (ref 8.9–10.3)
Chloride: 104 mmol/L (ref 98–111)
Creatinine, Ser: 1.29 mg/dL — ABNORMAL HIGH (ref 0.44–1.00)
GFR calc Af Amer: 55 mL/min — ABNORMAL LOW (ref >60)
GFR calc non Af Amer: 48 mL/min — ABNORMAL LOW (ref >60)
Glucose, Bld: 116 mg/dL — ABNORMAL HIGH (ref 70–99)
Potassium: 4.1 mmol/L (ref 3.5–5.1)
Sodium: 138 mmol/L (ref 135–145)
Total Bilirubin: 0.6 mg/dL (ref 0.3–1.2)
Total Protein: 6.6 g/dL (ref 6.5–8.1)

## 2018-10-14 LAB — BODY FLUID CELL COUNT WITH DIFFERENTIAL
Eos, Fluid: 4 %
Lymphs, Fluid: 43 %
Monocyte-Macrophage-Serous Fluid: 12 % — ABNORMAL LOW (ref 50–90)
Neutrophil Count, Fluid: 41 % — ABNORMAL HIGH (ref 0–25)

## 2018-10-14 LAB — GLUCOSE, PLEURAL OR PERITONEAL FLUID: Glucose, Fluid: 96 mg/dL

## 2018-10-14 LAB — PROTEIN, PLEURAL OR PERITONEAL FLUID: Total protein, fluid: 4.6 g/dL

## 2018-10-14 LAB — TSH: TSH: 3.637 u[IU]/mL (ref 0.350–4.500)

## 2018-10-14 LAB — HIV ANTIBODY (ROUTINE TESTING W REFLEX): HIV Screen 4th Generation wRfx: NONREACTIVE

## 2018-10-14 LAB — SARS CORONAVIRUS 2 (TAT 6-24 HRS): SARS Coronavirus 2: NEGATIVE

## 2018-10-14 LAB — PHOSPHORUS: Phosphorus: 4.1 mg/dL (ref 2.5–4.6)

## 2018-10-14 LAB — MAGNESIUM: Magnesium: 2.3 mg/dL (ref 1.7–2.4)

## 2018-10-14 MED ORDER — LIDOCAINE HCL 1 % IJ SOLN
INTRAMUSCULAR | Status: AC
  Filled 2018-10-14: qty 20

## 2018-10-14 NOTE — Procedures (Signed)
PROCEDURE SUMMARY:  Successful image-guided left thoracentesis. Yielded 1.5 liters of hazy amber fluid. Patient tolerated procedure well. No immediate complications. EBL < 5 mL.  Specimen was sent for labs. CXR ordered.  Please see imaging section of Epic for full dictation.   Claris Pong Dekari Bures PA-C 10/14/2018 12:40 PM

## 2018-10-14 NOTE — Progress Notes (Signed)
HEMATOLOGY-ONCOLOGY PROGRESS NOTE  SUBJECTIVE: Nancy Moore is well-known to me from the office.  She was admitted secondary to worsening shortness of breath and chest pain.  On admission, she had a CT angiogram the chest which showed no obvious pulmonary embolism, but there was significant increase in the volume of the left pleural effusion compared with the prior scan on 09/24/2018.  There was also a new subpleural mass within the paramediastinal left upper lobe with mediastinal invasion which is concerning for disease progression, similar appearance of the diffuse micro-nodularity and septal thickening compatible with metastatic disease, lymphangitic spread cannot be excluded.  She recently underwent ultrasound-guided thoracentesis on 09/30/2018.  She is scheduled to undergo another thoracentesis later today.  Reports breathing is better since being on oxygen.  She still has a nonproductive cough.  Chest pain seems to be improved this morning.  REVIEW OF SYSTEMS:   Constitutional: Denies fevers, chills Respiratory: Has nonproductive cough and shortness of breath Cardiovascular: Chest pain resolved Gastrointestinal:  Denies nausea, heartburn or change in bowel habits Skin: Denies abnormal skin rashes Lymphatics: Denies new lymphadenopathy or easy bruising Neurological:Denies numbness, tingling or new weaknesses Behavioral/Psych: Mood is stable, no new changes  Extremities: No lower extremity edema All other systems were reviewed with the patient and are negative.  I have reviewed the past medical history, past surgical history, social history and family history with the patient and they are unchanged from previous note.   PHYSICAL EXAMINATION: ECOG PERFORMANCE STATUS: 1 - Symptomatic but completely ambulatory  Vitals:   10/13/18 2135 10/14/18 0415  BP: (!) 109/40 95/70  Pulse: (!) 118 97  Resp: (!) 22 16  Temp: 99.3 F (37.4 C) 98.7 F (37.1 C)  SpO2: 91% 96%   Filed Weights   10/13/18  2155  Weight: 243 lb 9.7 oz (110.5 kg)    Intake/Output from previous day: 09/16 0701 - 09/17 0700 In: 240 [P.O.:240] Out: -   GENERAL:alert, no distress and comfortable SKIN: skin color, texture, turgor are normal, no rashes or significant lesions EYES: normal, Conjunctiva are pink and non-injected, sclera clear OROPHARYNX:no exudate, no erythema and lips, buccal mucosa, and tongue normal  NECK: supple, thyroid normal size, non-tender, without nodularity LYMPH:  no palpable lymphadenopathy in the cervical, axillary or inguinal LUNGS: Diminished breath sounds in the left posterior lung field from mid lung down to the base HEART: Tachycardic ABDOMEN:abdomen soft, non-tender and normal bowel sounds Musculoskeletal:no cyanosis of digits and no clubbing  NEURO: alert & oriented x 3 with fluent speech, no focal motor/sensory deficits  LABORATORY DATA:  I have reviewed the data as listed CMP Latest Ref Rng & Units 10/14/2018 10/13/2018 09/24/2018  Glucose 70 - 99 mg/dL 116(H) 109(H) 105(H)  BUN 6 - 20 mg/dL '12 13 14  ' Creatinine 0.44 - 1.00 mg/dL 1.29(H) 1.35(H) 1.43(H)  Sodium 135 - 145 mmol/L 138 136 140  Potassium 3.5 - 5.1 mmol/L 4.1 4.2 4.2  Chloride 98 - 111 mmol/L 104 97(L) 103  CO2 22 - 32 mmol/L '24 28 25  ' Calcium 8.9 - 10.3 mg/dL 8.7(L) 9.4 9.3  Total Protein 6.5 - 8.1 g/dL 6.6 8.1 7.1  Total Bilirubin 0.3 - 1.2 mg/dL 0.6 1.0 0.4  Alkaline Phos 38 - 126 U/L 69 84 105  AST 15 - 41 U/L '16 17 15  ' ALT 0 - 44 U/L '10 12 8    ' Lab Results  Component Value Date   WBC 10.9 (H) 10/14/2018   HGB 11.9 (L) 10/14/2018  HCT 37.9 10/14/2018   MCV 92.2 10/14/2018   PLT 295 10/14/2018   NEUTROABS 7.1 09/24/2018    Dg Chest 1 View  Result Date: 09/30/2018 CLINICAL DATA:  Status post left thoracentesis EXAM: CHEST  1 VIEW COMPARISON:  09/24/2018 FINDINGS: Cardiac shadow is within normal limits. Left-sided pleural effusion is noted although significantly reduced from the prior  preprocedure CT. No pneumothorax is noted. Some mild persistent atelectatic changes are noted in the left base. The right lung remains clear. IMPRESSION: No pneumothorax following left-sided thoracentesis. Electronically Signed   By: Inez Catalina M.D.   On: 09/30/2018 12:03   Dg Chest 2 View  Result Date: 10/13/2018 CLINICAL DATA:  Chest pain short of breath EXAM: CHEST - 2 VIEW COMPARISON:  09/30/2018, CT 09/24/2018 FINDINGS: Large left-sided pleural effusion, increased compared to prior. Airspace disease in the left mid to lower lung. Mild ground-glass opacity in the left upper lobe. Patchy interstitial and ground-glass opacity in the right thorax. CT demonstrated pulmonary nodules are better seen on CT. The cardiomediastinal silhouette is enlarged. No pneumothorax. IMPRESSION: 1. Large left-sided pleural effusion, increased compared to prior, with increased airspace disease in the left mid to lower lung zone. 2. Interstitial and ground-glass opacity in the left upper lobe, and patchy interstitial and ground-glass opacity in the right thorax grossly similar as compared with prior chest CT. Electronically Signed   By: Donavan Foil M.D.   On: 10/13/2018 15:38   Ct Chest W Contrast  Result Date: 09/24/2018 CLINICAL DATA:  53 year old female with history of bilateral lung cancer since September EXAM: CT CHEST, ABDOMEN, AND PELVIS WITH CONTRAST TECHNIQUE: Multidetector CT imaging of the chest, abdomen and pelvis was performed following the standard protocol during bolus administration of intravenous contrast. CONTRAST:  123m OMNIPAQUE IOHEXOL 300 MG/ML  SOLN COMPARISON:  Chest CT abdomen and pelvis 06/09/2018, as well as many other priors. FINDINGS: CT CHEST FINDINGS Cardiovascular: Heart size is normal. There is no significant pericardial fluid, thickening or pericardial calcification. Aortic atherosclerosis. No coronary artery calcifications. Mediastinum/Nodes: No pathologically enlarged mediastinal or  hilar lymph nodes. Esophagus is unremarkable in appearance. No axillary lymphadenopathy. Lungs/Pleura: Direct comparison with the prior study is challenging secondary to extensive patient respiratory motion, as well as compression of much of the lung parenchyma by the new moderate to large left pleural effusion. With this limitation in mind, there continue to be multiple pulmonary nodules in the lungs bilaterally which overall appear relatively similar to the prior study. The largest of these include a nodule in the superior segment of the left lower lobe (axial image 53 of series 4) measuring 1 cm, a left lower lobe nodule (axial image 72 of series 4) measuring 1.1 x 0.8 cm, and a nodule in the posterior aspect of the right upper lobe abutting the major fissure with some extension into the adjacent superior segment of the right lower lobe measuring 1.9 x 11.1 cm (axial image 47 of series 4). Widespread septal thickening and extensive peribronchovascular micronodularity again noted, increased in the left upper lobe anteriorly when compared to the prior examination, where there are new areas of confluent consolidation. Patchy ground-glass attenuation in a peribronchovascular distribution most evident throughout the mid to upper lungs bilaterally, also similar to the prior examination. Trace amount of pleural thickening and enhancement posteriorly, Coll appreciated on axial image 45 of series 2. No right pleural effusion. Musculoskeletal: There are no aggressive appearing lytic or blastic lesions noted in the visualized portions of the skeleton. CT ABDOMEN  PELVIS FINDINGS Hepatobiliary: No suspicious cystic or solid hepatic lesions. No intra or extrahepatic biliary ductal dilatation. Gallbladder is normal in appearance. Pancreas: No pancreatic mass. No pancreatic ductal dilatation. No pancreatic or peripancreatic fluid collections or inflammatory changes. Spleen: Unremarkable. Adrenals/Urinary Tract: Bilateral kidneys  and adrenal glands are normal in appearance. No hydroureteronephrosis. Urinary bladder is normal in appearance. Stomach/Bowel: Normal appearance of the stomach. No pathologic dilatation of small bowel or colon. The appendix is not confidently identified and may be surgically absent. Regardless, there are no inflammatory changes noted adjacent to the cecum to suggest the presence of an acute appendicitis at this time. Vascular/Lymphatic: Aortic atherosclerosis, without evidence of aneurysm or dissection in the abdominal or pelvic vasculature. No lymphadenopathy noted in the abdomen or pelvis. Reproductive: Status post hysterectomy. Ovaries are not confidently identified may be surgically absent or atrophic. Other: No significant volume of ascites.  No pneumoperitoneum. Musculoskeletal: There are no aggressive appearing lytic or blastic lesions noted in the visualized portions of the skeleton. IMPRESSION: 1. Moderate to large left pleural effusion, new compared to the prior examination, with probable thin pleural enhancement posteriorly, concerning for a malignant pleural effusion. 2. Overall appearance of the lungs is very similar to the prior study allowing for the limitations of today's examination, including the presence of multiple small pulmonary nodules, with exception of some increasing consolidation in the medial aspect of the left upper lobe. This appearance is concerning for lymphangitic spread of tumor in the lungs. 3. No definite signs of metastatic disease in the abdomen or pelvis. 4. Aortic atherosclerosis. 5. Additional incidental findings, as above. Electronically Signed   By: Vinnie Langton M.D.   On: 09/24/2018 14:06   Ct Angio Chest Pe W And/or Wo Contrast  Result Date: 10/13/2018 CLINICAL DATA:  History of lung cancer. Now presents with acute dyspnea. High pretest probability for pulmonary embolus. EXAM: CT ANGIOGRAPHY CHEST WITH CONTRAST TECHNIQUE: Multidetector CT imaging of the chest was  performed using the standard protocol during bolus administration of intravenous contrast. Multiplanar CT image reconstructions and MIPs were obtained to evaluate the vascular anatomy. CONTRAST:  152m OMNIPAQUE IOHEXOL 350 MG/ML SOLN COMPARISON:  CT chest 09/24/2018 FINDINGS: Cardiovascular: Exam detail is diminished due to respiratory motion artifact. The main pulmonary artery appears patent. No saddle embolus. No central obstructing pulmonary embolus identified. No definite lobar pulmonary artery embolus. Focal low-density filling defect within segmental branch of the left lower lobe pulmonary artery is noted, image 122/6 and image 70/8. Given the motion artifact this is equivocal for pulmonary artery embolus. Mediastinum/Nodes: Normal appearance of the thyroid gland. The trachea appears patent and is midline. Normal appearance of the esophagus. No enlarged mediastinal or hilar lymph nodes. Lungs/Pleura: Large recurrent left pleural effusion is identified status post thoracentesis on 09/30/2018. Pleural effusion is larger than on the exam from 09/24/2018 and there is considerable volume loss throughout the left lung. Paramediastinal tumor within the left upper lobe with extension into the superior mediastinum is new from previous exam measuring 3.2 x 1.4 by 3.1 cm. Again noted is diffuse interstitial thickening and micro nodularity with ground-glass attenuation in the partially atelectatic left upper lobe, increased from previous exam. Septal thickening and micro nodularity throughout the right lung is also again noted compatible with metastatic disease. Previous index within the superior segment of right lower lobe measures 1.0 cm, image 37/11. Similar to previous study. The index nodule in the superior segment of left lower lobe measures 1 cm and is similar to prior exam.  Upper Abdomen: No acute abnormality. Musculoskeletal: No chest wall abnormality. No acute or significant osseous findings. Review of the MIP  images confirms the above findings. IMPRESSION: 1. Significantly diminished exam detail due to diffuse respiratory motion artifact. This markedly diminishes evaluation of the lobar and segmental pulmonary arteries. No saddle embolus or central obstructing pulmonary artery embolus noted. Tiny low-density focus within a segmental branch of the right lower lobe pulmonary artery is equivocal for PE. 2. Significant increase in volume of left pleural effusion compared with 09/24/2018 with corresponding atelectasis of the left lower lobe and left upper lobe. 3. New subpleural mass within the paramediastinal left upper lobe with mediastinal invasion is noted concerning for progression of disease. 4. Similar appearance of diffuse micro nodularity and septal thickening compatible with metastatic disease. Lymphangitic spread of disease is not excluded. Electronically Signed   By: Kerby Moors M.D.   On: 10/13/2018 19:18   Mr Jeri Cos ON Contrast  Result Date: 09/21/2018 CLINICAL DATA:  53 year old female with metastatic non-small cell lung cancer to the brain. Status post 7-8 months of medical therapy using Tagrisso (EGFR targeted therapy that crosses the blood brain barrier) EXAM: MRI HEAD WITHOUT AND WITH CONTRAST TECHNIQUE: Multiplanar, multiecho pulse sequences of the brain and surrounding structures were obtained without and with intravenous contrast. CONTRAST:  10 milliliters Gadavist COMPARISON:  06/09/2018 and earlier. FINDINGS: Brain: No residual enhancing brain metastases are identified when compared to the December 2019 study. The post treatment hemosiderin associated with these was much better demonstrated on susceptibility weighted imaging on the May 2020 study. No cerebral edema. No abnormal enhancement identified. No midline shift, mass effect, or evidence of intracranial mass lesion. No dural thickening. No restricted diffusion to suggest acute infarction. No ventriculomegaly, extra-axial collection or  acute intracranial hemorrhage. Cervicomedullary junction and pituitary are within normal limits. Pearline Cables and white matter signal is within normal limits. Vascular: Major intracranial vascular flow voids are stable. The major dural venous sinuses are enhancing and appear to be patent. Skull and upper cervical spine: Negative visible cervical spine and spinal cord. Visualized bone marrow signal is within normal limits. Sinuses/Orbits: Negative orbits. Stable minor paranasal sinus mucosal thickening. Other: Mastoids remain clear. Visible internal auditory structures appear normal. Scalp and face soft tissues appear negative. IMPRESSION: 1. Essentially resolved innumerable small brain metastases since the MRI on 01/05/2018. 2. No new metastatic disease or acute intracranial abnormality identified. Electronically Signed   By: Genevie Ann M.D.   On: 09/21/2018 14:58   Ct Abdomen Pelvis W Contrast  Result Date: 09/24/2018 CLINICAL DATA:  53 year old female with history of bilateral lung cancer since September EXAM: CT CHEST, ABDOMEN, AND PELVIS WITH CONTRAST TECHNIQUE: Multidetector CT imaging of the chest, abdomen and pelvis was performed following the standard protocol during bolus administration of intravenous contrast. CONTRAST:  134m OMNIPAQUE IOHEXOL 300 MG/ML  SOLN COMPARISON:  Chest CT abdomen and pelvis 06/09/2018, as well as many other priors. FINDINGS: CT CHEST FINDINGS Cardiovascular: Heart size is normal. There is no significant pericardial fluid, thickening or pericardial calcification. Aortic atherosclerosis. No coronary artery calcifications. Mediastinum/Nodes: No pathologically enlarged mediastinal or hilar lymph nodes. Esophagus is unremarkable in appearance. No axillary lymphadenopathy. Lungs/Pleura: Direct comparison with the prior study is challenging secondary to extensive patient respiratory motion, as well as compression of much of the lung parenchyma by the new moderate to large left pleural  effusion. With this limitation in mind, there continue to be multiple pulmonary nodules in the lungs bilaterally which overall appear  relatively similar to the prior study. The largest of these include a nodule in the superior segment of the left lower lobe (axial image 53 of series 4) measuring 1 cm, a left lower lobe nodule (axial image 72 of series 4) measuring 1.1 x 0.8 cm, and a nodule in the posterior aspect of the right upper lobe abutting the major fissure with some extension into the adjacent superior segment of the right lower lobe measuring 1.9 x 11.1 cm (axial image 47 of series 4). Widespread septal thickening and extensive peribronchovascular micronodularity again noted, increased in the left upper lobe anteriorly when compared to the prior examination, where there are new areas of confluent consolidation. Patchy ground-glass attenuation in a peribronchovascular distribution most evident throughout the mid to upper lungs bilaterally, also similar to the prior examination. Trace amount of pleural thickening and enhancement posteriorly, Dupre appreciated on axial image 45 of series 2. No right pleural effusion. Musculoskeletal: There are no aggressive appearing lytic or blastic lesions noted in the visualized portions of the skeleton. CT ABDOMEN PELVIS FINDINGS Hepatobiliary: No suspicious cystic or solid hepatic lesions. No intra or extrahepatic biliary ductal dilatation. Gallbladder is normal in appearance. Pancreas: No pancreatic mass. No pancreatic ductal dilatation. No pancreatic or peripancreatic fluid collections or inflammatory changes. Spleen: Unremarkable. Adrenals/Urinary Tract: Bilateral kidneys and adrenal glands are normal in appearance. No hydroureteronephrosis. Urinary bladder is normal in appearance. Stomach/Bowel: Normal appearance of the stomach. No pathologic dilatation of small bowel or colon. The appendix is not confidently identified and may be surgically absent. Regardless, there  are no inflammatory changes noted adjacent to the cecum to suggest the presence of an acute appendicitis at this time. Vascular/Lymphatic: Aortic atherosclerosis, without evidence of aneurysm or dissection in the abdominal or pelvic vasculature. No lymphadenopathy noted in the abdomen or pelvis. Reproductive: Status post hysterectomy. Ovaries are not confidently identified may be surgically absent or atrophic. Other: No significant volume of ascites.  No pneumoperitoneum. Musculoskeletal: There are no aggressive appearing lytic or blastic lesions noted in the visualized portions of the skeleton. IMPRESSION: 1. Moderate to large left pleural effusion, new compared to the prior examination, with probable thin pleural enhancement posteriorly, concerning for a malignant pleural effusion. 2. Overall appearance of the lungs is very similar to the prior study allowing for the limitations of today's examination, including the presence of multiple small pulmonary nodules, with exception of some increasing consolidation in the medial aspect of the left upper lobe. This appearance is concerning for lymphangitic spread of tumor in the lungs. 3. No definite signs of metastatic disease in the abdomen or pelvis. 4. Aortic atherosclerosis. 5. Additional incidental findings, as above. Electronically Signed   By: Vinnie Langton M.D.   On: 09/24/2018 14:06   US Thoracentesis Asp Pleural Space W/img Guide  Result Date: 09/30/2018 INDICATION: Lung cancer. Left pleural effusion. Request for therapeutic thoracentesis. EXAM: ULTRASOUND GUIDED LEFT THORACENTESIS MEDICATIONS: 1% lidocaine 10 mL COMPLICATIONS: None immediate. PROCEDURE: An ultrasound guided thoracentesis was thoroughly discussed with the patient and questions answered. The benefits, risks, alternatives and complications were also discussed. The patient understands and wishes to proceed with the procedure. Written consent was obtained. Ultrasound was performed to localize  and mark an adequate pocket of fluid in the left chest. The area was then prepped and draped in the normal sterile fashion. 1% Lidocaine was used for local anesthesia. Under ultrasound guidance a 6 Fr Safe-T-Centesis catheter was introduced. Thoracentesis was performed. The catheter was removed and a dressing applied. FINDINGS:  A total of approximately 1.4 L of red fluid was removed. IMPRESSION: Successful ultrasound guided left thoracentesis yielding 1.4 L of pleural fluid. No pneumothorax on post-procedure chest x-ray. Read by: Gareth Eagle, PA-C Electronically Signed   By: Jacqulynn Cadet M.D.   On: 09/30/2018 12:39    ASSESSMENT AND PLAN: 1.  Stage IV adenocarcinoma of the lungContinue management of hypertension per hospitalist      LOS: 0 days   Mikey Bussing, DNP, AGPCNP-BC, AOCNP 10/14/18 2.  Recurrent malignant pleural effusion 3.  Hypertension 4.  Mild leukocytosis 5.  Mild renal insufficiency  -Proceed with thoracentesis by interventional radiology later today as ordered. -I have requested outpatient referral back to Dr. Prescott Gum who the patient is already established with for placement of a Pleurx catheter.  Hopefully we can get this done next week. -Continue Tagrisso.  She will need to use her home supply.  Will discuss CT scan results in more detail with her as an outpatient. -She has mild leukocytosis but no evidence of infection.  Continue to monitor.  Mikey Bussing, DNP, AGPCNP-BC, AOCNP

## 2018-10-14 NOTE — Progress Notes (Signed)
Referral to cardiothoracic surgery entered.  Request for Pleurx catheter placement for recurrent malignant pleural effusion as an outpatient.  Mikey Bussing, DNP, AGPCNP-BC, AOCNP

## 2018-10-14 NOTE — Discharge Summary (Addendum)
Physician Discharge Summary  Nancy Moore MGN:[id number] DOB: May 30, 1965 DOA: 10/13/2018  PCP: Darreld Mclean, MD  Admit date: 10/13/2018 Discharge date: 10/14/2018  Admitted From: Observation Disposition: home  Recommendations for Outpatient Follow-up:  1. Follow up with PCP in 1-2 weeks 2. Follow-up with hematology oncology as discussed  Home Health:No Equipment/Devices:none  Discharge Condition:Stable CODE STATUS:Limited code Diet recommendation: Regular healthy diet  Brief/Interim Summary: Nancy Moore is a 53 y.o. female with medical history significant of Stage IV non-small cell lung cancer, recurrent malignant pleural effusion, HTN hx of pericardial effusion sp Pericardial Window  Presented with   Worsening shortness of breath, chest pain similar to prior complains of pleural effusion and brain metastasis. No fever or chills.  Sp U guided thoraocentesis on 09/30/2018  Hospital course: Patient underwent ultrasound-guided thoracentesis with interventional radiology.  You to 1.5 L of fluid.  Patient also seen by oncology with plans for outpatient referral for Pleurx catheter placement.  Oncology will follow up with patient as an outpatient for worsening pulmonary findings on CT. Patient was noted to have a leukocytosis with infection versus edema.  Patient is afebrile and otherwise stable.  This is consistent with mild edema given patient's infiltrative process.  No indication of infection.  Will not treat with antibiotics.   Discharge Diagnoses:  Active Problems:   Adenocarcinoma of left lung, stage 4 (HCC)   Essential hypertension   Leukocytosis   Primary malignant neoplasm of lung with metastasis to brain [facility])   Pleural effusion    Discharge Instructions  Discharge Instructions    Call MD for:  difficulty breathing, headache or visual disturbances   Complete by: As directed    Call MD for:  extreme fatigue   Complete by: As directed    Call MD for:  persistant  dizziness or light-headedness   Complete by: As directed    Call MD for:  persistant nausea and vomiting   Complete by: As directed    Call MD for:  redness, tenderness, or signs of infection (pain, swelling, redness, odor or green/yellow discharge around incision site)   Complete by: As directed    Call MD for:  severe uncontrolled pain   Complete by: As directed    Call MD for:  temperature >100.4   Complete by: As directed    Diet general   Complete by: As directed    Increase activity slowly   Complete by: As directed      Allergies as of 10/14/2018      Reactions   Percocet [oxycodone-acetaminophen] Other (See Comments)   "makes me dizzy"   Lisinopril Cough   Losartan Cough      Medication List    STOP taking these medications   ammonium lactate 12 % lotion Commonly known as: LAC-HYDRIN   doxycycline 100 MG tablet Commonly known as: VIBRA-TABS     TAKE these medications   clindamycin 1 % gel Commonly known as: CLINDAGEL APPLY TO THE AFFECTED AREA(S) TWICE DAILY What changed: See the new instructions.   fluticasone 50 MCG/ACT nasal spray Commonly known as: FLONASE Place 2 sprays into both nostrils daily.   furosemide 40 MG tablet Commonly known as: LASIX TAKE 1 TABLET BY MOUTH ONCE DAILY   loperamide 1 MG/5ML solution Commonly known as: IMODIUM Take 2 mg as needed by mouth for diarrhea or loose stools.   magnesium oxide 400 (241.3 Mg) MG tablet Commonly known as: MAG-OX Take 1 tablet (400 mg total) by mouth daily.  methocarbamol 500 MG tablet Commonly known as: ROBAXIN TAKE 1 TABLET (500 MG TOTAL) BY MOUTH EVERY 8 (EIGHT) HOURS AS NEEDED FOR MUSCLE SPASMS.   nystatin cream Commonly known as: MYCOSTATIN Apply 1 application topically 2 (two) times daily. What changed:   when to take this  reasons to take this   Oxycodone HCl 10 MG Tabs TAKE 1 TABLET BY MOUTH 3 TIMES DAILY AS NEEDED What changed: reasons to take this   pantoprazole 40 MG  tablet Commonly known as: PROTONIX TAKE 1 TABLET (40 MG TOTAL) BY MOUTH DAILY AT 12 NOON. What changed: when to take this   potassium chloride SA 20 MEQ tablet Commonly known as: K-DUR TAKE 1 TABLET (20 MEQ TOTAL) BY MOUTH 2 TIMES DAILY. What changed: See the new instructions.   prochlorperazine 10 MG tablet Commonly known as: COMPAZINE Take 1 tablet (10 mg total) every 6 (six) hours as needed by mouth for nausea or vomiting.   senna-docusate 8.6-50 MG tablet Commonly known as: Senokot-S Take 1 tablet by mouth at bedtime. What changed:   when to take this  reasons to take this   Tagrisso 80 MG tablet Generic drug: osimertinib mesylate TAKE 1 TABLET BY MOUTH ONCE DAILY What changed: how much to take       Allergies  Allergen Reactions  . Percocet [Oxycodone-Acetaminophen] Other (See Comments)    "makes me dizzy"  . Lisinopril Cough  . Losartan Cough    Consultations:  IR   Procedures/Studies: Dg Chest 1 View  Result Date: 10/14/2018 CLINICAL DATA:  Status post left thoracentesis EXAM: CHEST  1 VIEW COMPARISON:  10/13/2018 FINDINGS: Slight interval improvement of a large left pleural effusion with associated atelectasis or consolidation. No significant pneumothorax. There is fine heterogeneous and interstitial opacity of the bilateral lungs. The cardiac borders are largely obscured by effusion. IMPRESSION: 1. Slight interval improvement of a large left pleural effusion with associated atelectasis or consolidation following thoracentesis. No significant pneumothorax. 2. There is fine heterogeneous and interstitial opacity of the bilateral lungs, consistent with infection or edema. Electronically Signed   By: Eddie Candle M.D.   On: 10/14/2018 12:53   Dg Chest 1 View  Result Date: 09/30/2018 CLINICAL DATA:  Status post left thoracentesis EXAM: CHEST  1 VIEW COMPARISON:  09/24/2018 FINDINGS: Cardiac shadow is within normal limits. Left-sided pleural effusion is noted  although significantly reduced from the prior preprocedure CT. No pneumothorax is noted. Some mild persistent atelectatic changes are noted in the left base. The right lung remains clear. IMPRESSION: No pneumothorax following left-sided thoracentesis. Electronically Signed   By: Inez Catalina M.D.   On: 09/30/2018 12:03   Dg Chest 2 View  Result Date: 10/13/2018 CLINICAL DATA:  Chest pain short of breath EXAM: CHEST - 2 VIEW COMPARISON:  09/30/2018, CT 09/24/2018 FINDINGS: Large left-sided pleural effusion, increased compared to prior. Airspace disease in the left mid to lower lung. Mild ground-glass opacity in the left upper lobe. Patchy interstitial and ground-glass opacity in the right thorax. CT demonstrated pulmonary nodules are better seen on CT. The cardiomediastinal silhouette is enlarged. No pneumothorax. IMPRESSION: 1. Large left-sided pleural effusion, increased compared to prior, with increased airspace disease in the left mid to lower lung zone. 2. Interstitial and ground-glass opacity in the left upper lobe, and patchy interstitial and ground-glass opacity in the right thorax grossly similar as compared with prior chest CT. Electronically Signed   By: Donavan Foil M.D.   On: 10/13/2018 15:38   Ct  Chest W Contrast  Result Date: 09/24/2018 CLINICAL DATA:  53 year old female with history of bilateral lung cancer since September EXAM: CT CHEST, ABDOMEN, AND PELVIS WITH CONTRAST TECHNIQUE: Multidetector CT imaging of the chest, abdomen and pelvis was performed following the standard protocol during bolus administration of intravenous contrast. CONTRAST:  117m OMNIPAQUE IOHEXOL 300 MG/ML  SOLN COMPARISON:  Chest CT abdomen and pelvis 06/09/2018, as well as many other priors. FINDINGS: CT CHEST FINDINGS Cardiovascular: Heart size is normal. There is no significant pericardial fluid, thickening or pericardial calcification. Aortic atherosclerosis. No coronary artery calcifications. Mediastinum/Nodes:  No pathologically enlarged mediastinal or hilar lymph nodes. Esophagus is unremarkable in appearance. No axillary lymphadenopathy. Lungs/Pleura: Direct comparison with the prior study is challenging secondary to extensive patient respiratory motion, as well as compression of much of the lung parenchyma by the new moderate to large left pleural effusion. With this limitation in mind, there continue to be multiple pulmonary nodules in the lungs bilaterally which overall appear relatively similar to the prior study. The largest of these include a nodule in the superior segment of the left lower lobe (axial image 53 of series 4) measuring 1 cm, a left lower lobe nodule (axial image 72 of series 4) measuring 1.1 x 0.8 cm, and a nodule in the posterior aspect of the right upper lobe abutting the major fissure with some extension into the adjacent superior segment of the right lower lobe measuring 1.9 x 11.1 cm (axial image 47 of series 4). Widespread septal thickening and extensive peribronchovascular micronodularity again noted, increased in the left upper lobe anteriorly when compared to the prior examination, where there are new areas of confluent consolidation. Patchy ground-glass attenuation in a peribronchovascular distribution most evident throughout the mid to upper lungs bilaterally, also similar to the prior examination. Trace amount of pleural thickening and enhancement posteriorly, Lem[place] appreciated on axial image 45 of series 2. No right pleural effusion. Musculoskeletal: There are no aggressive appearing lytic or blastic lesions noted in the visualized portions of the skeleton. CT ABDOMEN PELVIS FINDINGS Hepatobiliary: No suspicious cystic or solid hepatic lesions. No intra or extrahepatic biliary ductal dilatation. Gallbladder is normal in appearance. Pancreas: No pancreatic mass. No pancreatic ductal dilatation. No pancreatic or peripancreatic fluid collections or inflammatory changes. Spleen: Unremarkable.  Adrenals/Urinary Tract: Bilateral kidneys and adrenal glands are normal in appearance. No hydroureteronephrosis. Urinary bladder is normal in appearance. Stomach/Bowel: Normal appearance of the stomach. No pathologic dilatation of small bowel or colon. The appendix is not confidently identified and may be surgically absent. Regardless, there are no inflammatory changes noted adjacent to the cecum to suggest the presence of an acute appendicitis at this time. Vascular/Lymphatic: Aortic atherosclerosis, without evidence of aneurysm or dissection in the abdominal or pelvic vasculature. No lymphadenopathy noted in the abdomen or pelvis. Reproductive: Status post hysterectomy. Ovaries are not confidently identified may be surgically absent or atrophic. Other: No significant volume of ascites.  No pneumoperitoneum. Musculoskeletal: There are no aggressive appearing lytic or blastic lesions noted in the visualized portions of the skeleton. IMPRESSION: 1. Moderate to large left pleural effusion, new compared to the prior examination, with probable thin pleural enhancement posteriorly, concerning for a malignant pleural effusion. 2. Overall appearance of the lungs is very similar to the prior study allowing for the limitations of today's examination, including the presence of multiple small pulmonary nodules, with exception of some increasing consolidation in the medial aspect of the left upper lobe. This appearance is concerning for lymphangitic spread of tumor  in the lungs. 3. No definite signs of metastatic disease in the abdomen or pelvis. 4. Aortic atherosclerosis. 5. Additional incidental findings, as above. Electronically Signed   By: Vinnie Langton M.D.   On: 09/24/2018 14:06   Ct Angio Chest Pe W And/or Wo Contrast  Result Date: 10/13/2018 CLINICAL DATA:  History of lung cancer. Now presents with acute dyspnea. High pretest probability for pulmonary embolus. EXAM: CT ANGIOGRAPHY CHEST WITH CONTRAST TECHNIQUE:  Multidetector CT imaging of the chest was performed using the standard protocol during bolus administration of intravenous contrast. Multiplanar CT image reconstructions and MIPs were obtained to evaluate the vascular anatomy. CONTRAST:  169m OMNIPAQUE IOHEXOL 350 MG/ML SOLN COMPARISON:  CT chest 09/24/2018 FINDINGS: Cardiovascular: Exam detail is diminished due to respiratory motion artifact. The main pulmonary artery appears patent. No saddle embolus. No central obstructing pulmonary embolus identified. No definite lobar pulmonary artery embolus. Focal low-density filling defect within segmental branch of the left lower lobe pulmonary artery is noted, image 122/6 and image 70/8. Given the motion artifact this is equivocal for pulmonary artery embolus. Mediastinum/Nodes: Normal appearance of the thyroid gland. The trachea appears patent and is midline. Normal appearance of the esophagus. No enlarged mediastinal or hilar lymph nodes. Lungs/Pleura: Large recurrent left pleural effusion is identified status post thoracentesis on 09/30/2018. Pleural effusion is larger than on the exam from 09/24/2018 and there is considerable volume loss throughout the left lung. Paramediastinal tumor within the left upper lobe with extension into the superior mediastinum is new from previous exam measuring 3.2 x 1.4 by 3.1 cm. Again noted is diffuse interstitial thickening and micro nodularity with ground-glass attenuation in the partially atelectatic left upper lobe, increased from previous exam. Septal thickening and micro nodularity throughout the right lung is also again noted compatible with metastatic disease. Previous index within the superior segment of right lower lobe measures 1.0 cm, image 37/11. Similar to previous study. The index nodule in the superior segment of left lower lobe measures 1 cm and is similar to prior exam. Upper Abdomen: No acute abnormality. Musculoskeletal: No chest wall abnormality. No acute or  significant osseous findings. Review of the MIP images confirms the above findings. IMPRESSION: 1. Significantly diminished exam detail due to diffuse respiratory motion artifact. This markedly diminishes evaluation of the lobar and segmental pulmonary arteries. No saddle embolus or central obstructing pulmonary artery embolus noted. Tiny low-density focus within a segmental branch of the right lower lobe pulmonary artery is equivocal for PE. 2. Significant increase in volume of left pleural effusion compared with 09/24/2018 with corresponding atelectasis of the left lower lobe and left upper lobe. 3. New subpleural mass within the paramediastinal left upper lobe with mediastinal invasion is noted concerning for progression of disease. 4. Similar appearance of diffuse micro nodularity and septal thickening compatible with metastatic disease. Lymphangitic spread of disease is not excluded. Electronically Signed   By: TKerby MoorsM.D.   On: 10/13/2018 19:18   Mr BJeri CosWWIContrast  Result Date: 09/21/2018 CLINICAL DATA:  53year old female with metastatic non-small cell lung cancer to the brain. Status post 7-8 months of medical therapy using Tagrisso (EGFR targeted therapy that crosses the blood brain barrier) EXAM: MRI HEAD WITHOUT AND WITH CONTRAST TECHNIQUE: Multiplanar, multiecho pulse sequences of the brain and surrounding structures were obtained without and with intravenous contrast. CONTRAST:  10 milliliters Gadavist COMPARISON:  06/09/2018 and earlier. FINDINGS: Brain: No residual enhancing brain metastases are identified when compared to the December 2019 study. The  post treatment hemosiderin associated with these was much better demonstrated on susceptibility weighted imaging on the May 2020 study. No cerebral edema. No abnormal enhancement identified. No midline shift, mass effect, or evidence of intracranial mass lesion. No dural thickening. No restricted diffusion to suggest acute infarction. No  ventriculomegaly, extra-axial collection or acute intracranial hemorrhage. Cervicomedullary junction and pituitary are within normal limits. Pearline Cables and white matter signal is within normal limits. Vascular: Major intracranial vascular flow voids are stable. The major dural venous sinuses are enhancing and appear to be patent. Skull and upper cervical spine: Negative visible cervical spine and spinal cord. Visualized bone marrow signal is within normal limits. Sinuses/Orbits: Negative orbits. Stable minor paranasal sinus mucosal thickening. Other: Mastoids remain clear. Visible internal auditory structures appear normal. Scalp and face soft tissues appear negative. IMPRESSION: 1. Essentially resolved innumerable small brain metastases since the MRI on 01/05/2018. 2. No new metastatic disease or acute intracranial abnormality identified. Electronically Signed   By: Genevie Ann M.D.   On: 09/21/2018 14:58   Ct Abdomen Pelvis W Contrast  Result Date: 09/24/2018 CLINICAL DATA:  53 year old female with history of bilateral lung cancer since September EXAM: CT CHEST, ABDOMEN, AND PELVIS WITH CONTRAST TECHNIQUE: Multidetector CT imaging of the chest, abdomen and pelvis was performed following the standard protocol during bolus administration of intravenous contrast. CONTRAST:  180m OMNIPAQUE IOHEXOL 300 MG/ML  SOLN COMPARISON:  Chest CT abdomen and pelvis 06/09/2018, as well as many other priors. FINDINGS: CT CHEST FINDINGS Cardiovascular: Heart size is normal. There is no significant pericardial fluid, thickening or pericardial calcification. Aortic atherosclerosis. No coronary artery calcifications. Mediastinum/Nodes: No pathologically enlarged mediastinal or hilar lymph nodes. Esophagus is unremarkable in appearance. No axillary lymphadenopathy. Lungs/Pleura: Direct comparison with the prior study is challenging secondary to extensive patient respiratory motion, as well as compression of much of the lung parenchyma by the  new moderate to large left pleural effusion. With this limitation in mind, there continue to be multiple pulmonary nodules in the lungs bilaterally which overall appear relatively similar to the prior study. The largest of these include a nodule in the superior segment of the left lower lobe (axial image 53 of series 4) measuring 1 cm, a left lower lobe nodule (axial image 72 of series 4) measuring 1.1 x 0.8 cm, and a nodule in the posterior aspect of the right upper lobe abutting the major fissure with some extension into the adjacent superior segment of the right lower lobe measuring 1.9 x 11.1 cm (axial image 47 of series 4). Widespread septal thickening and extensive peribronchovascular micronodularity again noted, increased in the left upper lobe anteriorly when compared to the prior examination, where there are new areas of confluent consolidation. Patchy ground-glass attenuation in a peribronchovascular distribution most evident throughout the mid to upper lungs bilaterally, also similar to the prior examination. Trace amount of pleural thickening and enhancement posteriorly, [place] appreciated on axial image 45 of series 2. No right pleural effusion. Musculoskeletal: There are no aggressive appearing lytic or blastic lesions noted in the visualized portions of the skeleton. CT ABDOMEN PELVIS FINDINGS Hepatobiliary: No suspicious cystic or solid hepatic lesions. No intra or extrahepatic biliary ductal dilatation. Gallbladder is normal in appearance. Pancreas: No pancreatic mass. No pancreatic ductal dilatation. No pancreatic or peripancreatic fluid collections or inflammatory changes. Spleen: Unremarkable. Adrenals/Urinary Tract: Bilateral kidneys and adrenal glands are normal in appearance. No hydroureteronephrosis. Urinary bladder is normal in appearance. Stomach/Bowel: Normal appearance of the stomach. No pathologic dilatation of small  bowel or colon. The appendix is not confidently identified and may be  surgically absent. Regardless, there are no inflammatory changes noted adjacent to the cecum to suggest the presence of an acute appendicitis at this time. Vascular/Lymphatic: Aortic atherosclerosis, without evidence of aneurysm or dissection in the abdominal or pelvic vasculature. No lymphadenopathy noted in the abdomen or pelvis. Reproductive: Status post hysterectomy. Ovaries are not confidently identified may be surgically absent or atrophic. Other: No significant volume of ascites.  No pneumoperitoneum. Musculoskeletal: There are no aggressive appearing lytic or blastic lesions noted in the visualized portions of the skeleton. IMPRESSION: 1. Moderate to large left pleural effusion, new compared to the prior examination, with probable thin pleural enhancement posteriorly, concerning for a malignant pleural effusion. 2. Overall appearance of the lungs is very similar to the prior study allowing for the limitations of today's examination, including the presence of multiple small pulmonary nodules, with exception of some increasing consolidation in the medial aspect of the left upper lobe. This appearance is concerning for lymphangitic spread of tumor in the lungs. 3. No definite signs of metastatic disease in the abdomen or pelvis. 4. Aortic atherosclerosis. 5. Additional incidental findings, as above. Electronically Signed   By: Vinnie Langton M.D.   On: 09/24/2018 14:06   US Thoracentesis Asp Pleural Space W/img Guide  Result Date: 10/14/2018 INDICATION: Patient with history of lung cancer, dyspnea, her current left malignant pleural effusion. Request is made for therapeutic left thoracentesis. EXAM: ULTRASOUND GUIDED THERAPEUTIC LEFT THORACENTESIS MEDICATIONS: 10 mL 1% lidocaine COMPLICATIONS: None immediate. PROCEDURE: An ultrasound guided thoracentesis was thoroughly discussed with the patient and questions answered. The benefits, risks, alternatives and complications were also discussed. The patient  understands and wishes to proceed with the procedure. Written consent was obtained. Ultrasound was performed to localize and mark an adequate pocket of fluid in the left chest. The area was then prepped and draped in the normal sterile fashion. 1% Lidocaine was used for local anesthesia. Under ultrasound guidance a 6 Fr Safe-T-Centesis catheter was introduced. Thoracentesis was performed. The catheter was removed and a dressing applied. FINDINGS: A total of approximately 1.5 L of hazy amber fluid was removed. IMPRESSION: Successful ultrasound guided left thoracentesis yielding 1.5 L of pleural fluid. Read by: Earley Abide, PA-C Electronically Signed   By: Jerilynn Mages.  Shick M.D.   On: 10/14/2018 14:12   US Thoracentesis Asp Pleural Space W/img Guide  Result Date: 09/30/2018 INDICATION: Lung cancer. Left pleural effusion. Request for therapeutic thoracentesis. EXAM: ULTRASOUND GUIDED LEFT THORACENTESIS MEDICATIONS: 1% lidocaine 10 mL COMPLICATIONS: None immediate. PROCEDURE: An ultrasound guided thoracentesis was thoroughly discussed with the patient and questions answered. The benefits, risks, alternatives and complications were also discussed. The patient understands and wishes to proceed with the procedure. Written consent was obtained. Ultrasound was performed to localize and mark an adequate pocket of fluid in the left chest. The area was then prepped and draped in the normal sterile fashion. 1% Lidocaine was used for local anesthesia. Under ultrasound guidance a 6 Fr Safe-T-Centesis catheter was introduced. Thoracentesis was performed. The catheter was removed and a dressing applied. FINDINGS: A total of approximately 1.4 L of red fluid was removed. IMPRESSION: Successful ultrasound guided left thoracentesis yielding 1.4 L of pleural fluid. No pneumothorax on post-procedure chest x-ray. Read by: Gareth Eagle, PA-C Electronically Signed   By: Jacqulynn Cadet M.D.   On: 09/30/2018 12:39       Subjective: No  acute events overnight Underwent thoracentesis with no complications  Discharge Exam: Vitals:   10/14/18 1238 10/14/18 1332  BP: 104/86 110/74  Pulse:  99  Resp:  17  Temp:  99.6 F (37.6 C)  SpO2:  98%   Vitals:   10/14/18 1144 10/14/18 1230 10/14/18 1238 10/14/18 1332  BP: 118/68 114/68 104/86 110/74  Pulse:    99  Resp:    17  Temp:    99.6 F (37.6 C)  TempSrc:    Oral  SpO2:    98%  Weight:      Height:        General: Pt is alert, awake, not in acute distress Cardiovascular: RRR, S1/S2 +, no rubs, no gallops Respiratory: CTA bilaterally, no wheezing, no rhonchi Abdominal: Soft, NT, ND, bowel sounds + Extremities: no edema, no cyanosis    The results of significant diagnostics from this hospitalization (including imaging, microbiology, ancillary and laboratory) are listed below for reference.     Microbiology: Recent Results (from the past 240 hour(s))  SARS CORONAVIRUS 2 (TAT 6-24 HRS) Nasopharyngeal Nasopharyngeal Swab     Status: None   Collection Time: 10/13/18  6:07 PM   Specimen: Nasopharyngeal Swab  Result Value Ref Range Status   SARS Coronavirus 2 NEGATIVE NEGATIVE Final    Comment: (NOTE) SARS-CoV-2 target nucleic acids are NOT DETECTED. The SARS-CoV-2 RNA is generally detectable in upper and lower respiratory specimens during the acute phase of infection. Negative results do not preclude SARS-CoV-2 infection, do not rule out co-infections with other pathogens, and should not be used as the sole basis for treatment or other patient management decisions. Negative results must be combined with clinical observations, patient history, and epidemiological information. The expected result is Negative. Fact Sheet for Patients: SugarRoll.be Fact Sheet for Healthcare Providers: https://www.woods-mathews.com/ This test is not yet approved or cleared by the [place] FDA and  has been authorized for detection  and/or diagnosis of SARS-CoV-2 by FDA under an Emergency Use Authorization (EUA). This EUA will remain  in effect (meaning this test can be used) for the duration of the COVID-19 declaration under Section 56 4(b)(1) of the Act, 21 U.S.C. section 360bbb-3(b)(1), unless the authorization is terminated or revoked sooner. Performed at [facility], [place] [address] [place], [place] [address]      Labs: BNP (last 3 results) No results for input(s): BNP in the last 8760 hours. Basic Metabolic Panel: Recent Labs  Lab 10/13/18 1645 10/14/18 0426  NA 136 138  K 4.2 4.1  CL 97* 104  CO2 28 24  GLUCOSE 109* 116*  BUN 13 12  CREATININE 1.35* 1.29*  CALCIUM 9.4 8.7*  MG  --  2.3  PHOS  --  4.1   Liver Function Tests: Recent Labs  Lab 10/13/18 1653 10/14/18 0426  AST 17 16  ALT 12 10  ALKPHOS 84 69  BILITOT 1.0 0.6  PROT 8.1 6.6  ALBUMIN 3.7 3.1*   No results for input(s): LIPASE, AMYLASE in the last 168 hours. No results for input(s): AMMONIA in the last 168 hours. CBC: Recent Labs  Lab 10/13/18 1645 10/14/18 0426  WBC 12.6* 10.9*  HGB 13.5 11.9*  HCT 43.1 37.9  MCV 91.9 92.2  PLT 315 295   Cardiac Enzymes: No results for input(s): CKTOTAL, CKMB, CKMBINDEX, TROPONINI in the last 168 hours. BNP: Invalid input(s): POCBNP CBG: No results for input(s): GLUCAP in the last 168 hours. D-Dimer No results for input(s): DDIMER in the last 72 hours. Hgb A1c No results for input(s):  HGBA1C in the last 72 hours. Lipid Profile No results for input(s): CHOL, HDL, LDLCALC, TRIG, CHOLHDL, LDLDIRECT in the last 72 hours. Thyroid function studies Recent Labs    10/14/18 0426  TSH 3.637   Anemia work up No results for input(s): VITAMINB12, FOLATE, FERRITIN, TIBC, IRON, RETICCTPCT in the last 72 hours. Urinalysis    Component Value Date/Time   COLORURINE STRAW (A) 01/10/2017 0625   APPEARANCEUR CLEAR 01/10/2017 0625   LABSPEC 1.005 01/10/2017 0625   PHURINE 5.0  01/10/2017 0625   GLUCOSEU NEGATIVE 01/10/2017 0625   HGBUR SMALL (A) 01/10/2017 0625   BILIRUBINUR NEGATIVE 01/10/2017 0625   KETONESUR NEGATIVE 01/10/2017 0625   PROTEINUR NEGATIVE 01/10/2017 0625   UROBILINOGEN 1.0 07/22/2010 2017   NITRITE NEGATIVE 01/10/2017 0625   LEUKOCYTESUR SMALL (A) 01/10/2017 0625   Sepsis Labs Invalid input(s): PROCALCITONIN,  WBC,  LACTICIDVEN Microbiology Recent Results (from the past 240 hour(s))  SARS CORONAVIRUS 2 (TAT 6-24 HRS) Nasopharyngeal Nasopharyngeal Swab     Status: None   Collection Time: 10/13/18  6:07 PM   Specimen: Nasopharyngeal Swab  Result Value Ref Range Status   SARS Coronavirus 2 NEGATIVE NEGATIVE Final    Comment: (NOTE) SARS-CoV-2 target nucleic acids are NOT DETECTED. The SARS-CoV-2 RNA is generally detectable in upper and lower respiratory specimens during the acute phase of infection. Negative results do not preclude SARS-CoV-2 infection, do not rule out co-infections with other pathogens, and should not be used as the sole basis for treatment or other patient management decisions. Negative results must be combined with clinical observations, patient history, and epidemiological information. The expected result is Negative. Fact Sheet for Patients: SugarRoll.be Fact Sheet for Healthcare Providers: https://www.woods-mathews.com/ This test is not yet approved or cleared by the [place] FDA and  has been authorized for detection and/or diagnosis of SARS-CoV-2 by FDA under an Emergency Use Authorization (EUA). This EUA will remain  in effect (meaning this test can be used) for the duration of the COVID-19 declaration under Section 56 4(b)(1) of the Act, 21 U.S.C. section 360bbb-3(b)(1), unless the authorization is terminated or revoked sooner. Performed at [facility], [place] [address] [place], [place] [address]      Time coordinating discharge: Over 30  minutes  SIGNED:   Nicolette Bang, MD  [facility] 10/14/2018, 2:34 PM Pager   If 7PM-7AM, please contact night-coverage [email or web address] Password [email or web address]

## 2018-10-15 ENCOUNTER — Telehealth: Payer: Self-pay | Admitting: *Deleted

## 2018-10-15 ENCOUNTER — Other Ambulatory Visit: Payer: Self-pay | Admitting: Internal Medicine

## 2018-10-15 DIAGNOSIS — C3490 Malignant neoplasm of unspecified part of unspecified bronchus or lung: Secondary | ICD-10-CM

## 2018-10-15 LAB — PH, BODY FLUID: pH, Body Fluid: 7.4

## 2018-10-15 NOTE — Telephone Encounter (Signed)
Transition Care Management Follow-up Telephone Call   Date discharged? 10/14/18   How have you been since you were released from the hospital? "Doing Okay"   Do you understand why you were in the hospital? yes   Do you understand the discharge instructions? yes   Where were you discharged to? Home alone.    Items Reviewed:  Medications reviewed: "They did not make any changes"  Allergies reviewed: "no changes"  Dietary changes reviewed: yes  Referrals reviewed: yes   Functional Questionnaire:   Activities of Daily Living (ADLs):   She states they are independent in the following: ambulation, bathing and hygiene, feeding, continence, grooming, toileting and dressing States they require assistance with the following: na   Any transportation issues/concerns?: no   Any patient concerns? no   Confirmed importance and date/time of follow-up visits scheduled yes  Provider Appointment booked with  Confirmed with patient if condition begins to worsen call PCP or go to the ER.  Patient was given the office number and encouraged to call back with question or concerns.  : yes

## 2018-10-16 ENCOUNTER — Other Ambulatory Visit: Payer: Self-pay | Admitting: Internal Medicine

## 2018-10-16 DIAGNOSIS — C3490 Malignant neoplasm of unspecified part of unspecified bronchus or lung: Secondary | ICD-10-CM

## 2018-10-17 LAB — BODY FLUID CULTURE: Culture: NO GROWTH

## 2018-10-18 ENCOUNTER — Other Ambulatory Visit: Payer: Self-pay | Admitting: Cardiothoracic Surgery

## 2018-10-18 DIAGNOSIS — C3492 Malignant neoplasm of unspecified part of left bronchus or lung: Secondary | ICD-10-CM

## 2018-10-19 LAB — CYTOLOGY - NON PAP

## 2018-10-20 ENCOUNTER — Ambulatory Visit
Admission: RE | Admit: 2018-10-20 | Discharge: 2018-10-20 | Disposition: A | Discharge status: Home / Self Care | Payer: 59 | Source: Ambulatory Visit | Attending: Cardiothoracic Surgery | Admitting: Cardiothoracic Surgery

## 2018-10-20 ENCOUNTER — Other Ambulatory Visit: Payer: Self-pay

## 2018-10-20 ENCOUNTER — Encounter: Payer: Self-pay | Admitting: Cardiothoracic Surgery

## 2018-10-20 ENCOUNTER — Telehealth: Payer: Self-pay | Admitting: *Deleted

## 2018-10-20 ENCOUNTER — Institutional Professional Consult (permissible substitution) (INDEPENDENT_AMBULATORY_CARE_PROVIDER_SITE_OTHER): Payer: 59 | Admitting: Cardiothoracic Surgery

## 2018-10-20 ENCOUNTER — Other Ambulatory Visit: Payer: Self-pay | Admitting: *Deleted

## 2018-10-20 VITALS — BP 113/78 | HR 96 | Temp 97.3°F | Resp 16 | Ht 66.0 in | Wt 237.0 lb

## 2018-10-20 DIAGNOSIS — C3492 Malignant neoplasm of unspecified part of left bronchus or lung: Secondary | ICD-10-CM

## 2018-10-20 DIAGNOSIS — J910 Malignant pleural effusion: Secondary | ICD-10-CM

## 2018-10-20 DIAGNOSIS — J90 Pleural effusion, not elsewhere classified: Secondary | ICD-10-CM

## 2018-10-20 DIAGNOSIS — Z9889 Other specified postprocedural states: Secondary | ICD-10-CM

## 2018-10-20 NOTE — Telephone Encounter (Signed)
Oncology Nurse Navigator Documentation  Oncology Nurse Navigator Flowsheets 10/20/2018  Navigator Location [facility]  Referral Date to RadOnc/MedOnc -  Navigator Encounter Type Telephone/I received a call from Ms. Nancy Moore.  I called her back but was unable to reach her.  I did leave a vm message for her to call me with my name and phone number.   Telephone Outgoing Call  Abnormal Finding Date -  Confirmed Diagnosis Date -  Patient Visit Type -  Treatment Phase Treatment  Barriers/Navigation Needs Education  Education Other  Interventions -  Referrals -  Coordination of Care -  Education Method -  Acuity Level 2-Minimal Needs (1-2 Barriers Identified)  Acuity Level 2 -  Time Spent with Patient 15

## 2018-10-20 NOTE — Progress Notes (Signed)
PCP is Copland, Gay Filler, MD Referring Provider is Maryanna Shape, NP  Chief Complaint  Patient presents with  . Lung Cancer    Stage IV NSC  . Malignant Pleural Effusion    recurrent LEFT.Marland Kitchenthoracentesis 10/14/18...eval for pleurX.Marland KitchenMarland KitchenCTA CHEST  10/13/18  Patient examined, images of most recent CT scan of chest personally reviewed.  HPI: Patient presents for evaluation of a recurrent left malignant pleural effusion.  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 Earlie Server with [facility].  In 2018 she required a pericardial window for a symptomatic malignant pericardial effusion.  She now has been developing recurrent left malignant pleural effusions requiring thoracentesis every 2 to 3 weeks.  The last thoracentesis was 6 days ago which removed 1.7 L of fluid.  Chest x-ray today shows evidence of recurring effusion.  The most recent CT scan of the chest shows no significant pericardial effusion.  A Pleurx catheter has been recommended by her oncologist which seems appropriate and beneficial to the patient.  The patient has had no signs of upper respiratory infection.  She is on no anticoagulation.  She will be scheduled for outpatient placement of left Pleurx catheter at [facility] on September 29.  I discussed the procedure in detail, showed her the catheter and discussed the care and management of the catheter at home with the help of home health nursing.   Past Medical History:  Diagnosis Date  . Adenocarcinoma of left lung, stage 4 ([place]) 10/28/2016  . Goals of care, counseling/discussion 10/28/2016  . Hypertension     Past Surgical History:  Procedure Laterality Date  . BREAST EXCISIONAL BIOPSY Left 20+ yrs ago   benign  . PERICARDIAL WINDOW N/A 01/10/2017   Procedure: PERICARDIAL WINDOW- SUB XYPHOID, RIGHT CHEST TUBE;  Surgeon: Ivin Poot, MD;  Location: [facility];  Service: Thoracic;  Laterality: N/A;  . VIDEO BRONCHOSCOPY Bilateral 10/21/2016   Procedure: VIDEO BRONCHOSCOPY WITH FLUORO;  Surgeon: Tanda Rockers, MD;  Location: [facility];  Service: Cardiopulmonary;  Laterality: Bilateral;    Family History  Problem Relation Age of Onset  . Asthma Mother   . Stroke Mother   . Hypertension Mother   . Heart attack Father   . Hypertension Father   . Hyperlipidemia Father   . Dementia Father   . Emphysema Maternal Grandmother   . Hypertension Maternal Grandmother   . Colon cancer Paternal Grandmother   . Brain cancer Maternal Uncle     Social History Social History   Tobacco Use  . Smoking status: Never Smoker  . Smokeless tobacco: Never Used  Substance Use Topics  . Alcohol use: Yes    Comment: socially  . Drug use: No    Current Outpatient Medications  Medication Sig Dispense Refill  . clindamycin (CLINDAGEL) 1 % gel APPLY TO THE AFFECTED AREA(S) TWICE DAILY (Patient taking differently: Apply 1 application topically 2 (two) times daily. ) 30 g 1  . fluticasone (FLONASE) 50 MCG/ACT nasal spray Place 2 sprays into both nostrils daily.    . furosemide (LASIX) 40 MG tablet TAKE 1 TABLET BY MOUTH ONCE DAILY (Patient taking differently: Take 40 mg by mouth daily. ) 90 tablet 1  . loperamide (IMODIUM) 1 MG/5ML solution Take 2 mg as needed by mouth for diarrhea or loose stools.    . magnesium oxide (MAG-OX) 400 (241.3 Mg) MG tablet Take 1 tablet (400 mg total) by mouth daily.    . methocarbamol (ROBAXIN) 500  MG tablet TAKE 1 TABLET (500 MG TOTAL) BY MOUTH EVERY 8 (EIGHT) HOURS AS NEEDED FOR MUSCLE SPASMS. 30 tablet 5  . nystatin cream (MYCOSTATIN) Apply 1 application topically 2 (two) times daily. (Patient taking differently: Apply 1 application topically daily as needed for dry skin. ) 30 g 0  . Oxycodone HCl 10 MG TABS TAKE 1 TABLET BY MOUTH 3 TIMES DAILY AS NEEDED (Patient taking differently: Take 10 mg by mouth 3 (three) times daily as needed (pain). ) 60 tablet 0  . pantoprazole (PROTONIX) 40 MG tablet TAKE 1 TABLET (40  MG TOTAL) BY MOUTH DAILY AT 12 NOON. (Patient taking differently: Take 40 mg by mouth daily. ) 90 tablet 3  . potassium chloride SA (K-DUR) 20 MEQ tablet TAKE 1 TABLET (20 MEQ TOTAL) BY MOUTH 2 TIMES DAILY. (Patient taking differently: Take 20 mEq by mouth 2 (two) times daily. ) 180 tablet 3  . prochlorperazine (COMPAZINE) 10 MG tablet Take 1 tablet (10 mg total) every 6 (six) hours as needed by mouth for nausea or vomiting. 30 tablet 1  . senna-docusate (SENOKOT-S) 8.6-50 MG tablet Take 1 tablet by mouth at bedtime. (Patient taking differently: Take 1 tablet by mouth daily as needed for mild constipation. )    . TAGRISSO 80 MG tablet TAKE 1 TABLET BY MOUTH ONCE DAILY 30 tablet 0   No current facility-administered medications for this visit.     Allergies  Allergen Reactions  . Percocet [Oxycodone-Acetaminophen] Other (See Comments)    "makes me dizzy"  . Lisinopril Cough  . Losartan Cough    Review of Systems  Weight stable No fever Chronic dry cough related to pleural effusion Daily dosing of Tagrisso, tolerating well No edema No history of heart disease No history of diabetes  BP 113/78 (BP Location: Right Arm, Patient Position: Sitting, Cuff Size: Large)   Pulse 96   Temp (!) 97.3 F (36.3 C)   Resp 16   Ht 5' 6"  (1.676 m)   Wt 237 lb (107.5 kg)   LMP 03/15/2010   SpO2 96% Comment: RA  BMI 38.25 kg/m  Physical Exam Breath sounds diminished over left posterior lung field No palpable nodes in the neck Heart rhythm regular without murmur or friction rub Abdomen soft obese nontender Extremities warm well perfused Neuro intact  Diagnostic Tests: CT scan and chest x-ray images personally viewed as noted above  Impression: Recommend proceeding with left Pleurx catheter placement for recurrent left malignant effusion  Plan: Catheter placement scheduled for September 29 at [facility].  Patient understands the benefits and risks of the procedure and further Pleurx  catheter management of her recurrent pleural effusion.   Len Childs, MD [facility] [phone]

## 2018-10-22 ENCOUNTER — Other Ambulatory Visit (HOSPITAL_COMMUNITY)
Admission: RE | Admit: 2018-10-22 | Discharge: 2018-10-22 | Disposition: A | Discharge status: Home / Self Care | Payer: 59 | Source: Ambulatory Visit | Attending: Cardiothoracic Surgery | Admitting: Cardiothoracic Surgery

## 2018-10-22 DIAGNOSIS — Z20828 Contact with and (suspected) exposure to other viral communicable diseases: Secondary | ICD-10-CM | POA: Diagnosis not present

## 2018-10-22 DIAGNOSIS — Z01812 Encounter for preprocedural laboratory examination: Secondary | ICD-10-CM | POA: Insufficient documentation

## 2018-10-23 LAB — NOVEL CORONAVIRUS, NAA (HOSP ORDER, SEND-OUT TO REF LAB; TAT 18-24 HRS): SARS-CoV-2, NAA: NOT DETECTED

## 2018-10-25 ENCOUNTER — Encounter (HOSPITAL_COMMUNITY): Payer: Self-pay | Admitting: *Deleted

## 2018-10-25 ENCOUNTER — Other Ambulatory Visit: Payer: Self-pay

## 2018-10-25 ENCOUNTER — Inpatient Hospital Stay: Payer: Self-pay | Admitting: Family Medicine

## 2018-10-25 NOTE — Progress Notes (Signed)
Spoke with pt for pre-op call. Pt denies cardiac history or chest pain. Pt has hx of lung cancer and states she does get "winded" at times. Pt states she is not diabetic.  Pt had her Covid test done on 10/22/18 and it is negative. Pt states she has been in quarantine since the test was done.  Pt voiced understanding of the visitation policy.

## 2018-10-26 ENCOUNTER — Ambulatory Visit (HOSPITAL_COMMUNITY): Payer: 59 | Admitting: Anesthesiology

## 2018-10-26 ENCOUNTER — Ambulatory Visit (HOSPITAL_COMMUNITY): Payer: 59

## 2018-10-26 ENCOUNTER — Encounter (HOSPITAL_COMMUNITY)
Admission: RE | Disposition: A | Discharge status: Home / Self Care | Payer: Self-pay | Source: Home / Self Care | Attending: Cardiothoracic Surgery

## 2018-10-26 ENCOUNTER — Encounter (HOSPITAL_COMMUNITY): Payer: Self-pay

## 2018-10-26 ENCOUNTER — Ambulatory Visit (HOSPITAL_COMMUNITY)
Admission: RE | Admit: 2018-10-26 | Discharge: 2018-10-26 | Disposition: A | Discharge status: Home / Self Care | Payer: 59 | Attending: Cardiothoracic Surgery | Admitting: Cardiothoracic Surgery

## 2018-10-26 ENCOUNTER — Other Ambulatory Visit: Payer: Self-pay

## 2018-10-26 DIAGNOSIS — J90 Pleural effusion, not elsewhere classified: Secondary | ICD-10-CM

## 2018-10-26 DIAGNOSIS — Z885 Allergy status to narcotic agent status: Secondary | ICD-10-CM | POA: Diagnosis not present

## 2018-10-26 DIAGNOSIS — M1990 Unspecified osteoarthritis, unspecified site: Secondary | ICD-10-CM | POA: Insufficient documentation

## 2018-10-26 DIAGNOSIS — I10 Essential (primary) hypertension: Secondary | ICD-10-CM | POA: Diagnosis not present

## 2018-10-26 DIAGNOSIS — Z9689 Presence of other specified functional implants: Secondary | ICD-10-CM

## 2018-10-26 DIAGNOSIS — J910 Malignant pleural effusion: Secondary | ICD-10-CM | POA: Diagnosis present

## 2018-10-26 DIAGNOSIS — K219 Gastro-esophageal reflux disease without esophagitis: Secondary | ICD-10-CM | POA: Insufficient documentation

## 2018-10-26 DIAGNOSIS — Z888 Allergy status to other drugs, medicaments and biological substances status: Secondary | ICD-10-CM | POA: Diagnosis not present

## 2018-10-26 DIAGNOSIS — C3492 Malignant neoplasm of unspecified part of left bronchus or lung: Secondary | ICD-10-CM | POA: Diagnosis not present

## 2018-10-26 HISTORY — DX: Pleural effusion, not elsewhere classified: J90

## 2018-10-26 HISTORY — PX: CHEST TUBE INSERTION: SHX231

## 2018-10-26 HISTORY — DX: Pneumonia, unspecified organism: J18.9

## 2018-10-26 HISTORY — DX: Gastro-esophageal reflux disease without esophagitis: K21.9

## 2018-10-26 HISTORY — DX: Dyspnea, unspecified: R06.00

## 2018-10-26 HISTORY — DX: Anemia, unspecified: D64.9

## 2018-10-26 HISTORY — DX: Unspecified osteoarthritis, unspecified site: M19.90

## 2018-10-26 HISTORY — DX: Crohn's disease, unspecified, without complications: K50.90

## 2018-10-26 HISTORY — DX: Constipation, unspecified: K59.00

## 2018-10-26 LAB — APTT: aPTT: 29 seconds (ref 24–36)

## 2018-10-26 LAB — CBC
HCT: 43.6 % (ref 36.0–46.0)
Hemoglobin: 13.8 g/dL (ref 12.0–15.0)
MCH: 28.8 pg (ref 26.0–34.0)
MCHC: 31.7 g/dL (ref 30.0–36.0)
MCV: 91 fL (ref 80.0–100.0)
Platelets: 334 10*3/uL (ref 150–400)
RBC: 4.79 MIL/uL (ref 3.87–5.11)
RDW: 14 % (ref 11.5–15.5)
WBC: 7.9 10*3/uL (ref 4.0–10.5)
nRBC: 0 % (ref 0.0–0.2)

## 2018-10-26 LAB — COMPREHENSIVE METABOLIC PANEL
ALT: 12 U/L (ref 0–44)
AST: 19 U/L (ref 15–41)
Albumin: 3.1 g/dL — ABNORMAL LOW (ref 3.5–5.0)
Alkaline Phosphatase: 83 U/L (ref 38–126)
Anion gap: 11 (ref 5–15)
BUN: 10 mg/dL (ref 6–20)
CO2: 27 mmol/L (ref 22–32)
Calcium: 9.1 mg/dL (ref 8.9–10.3)
Chloride: 101 mmol/L (ref 98–111)
Creatinine, Ser: 1.32 mg/dL — ABNORMAL HIGH (ref 0.44–1.00)
GFR calc Af Amer: 54 mL/min — ABNORMAL LOW (ref >60)
GFR calc non Af Amer: 46 mL/min — ABNORMAL LOW (ref >60)
Glucose, Bld: 100 mg/dL — ABNORMAL HIGH (ref 70–99)
Potassium: 3.7 mmol/L (ref 3.5–5.1)
Sodium: 139 mmol/L (ref 135–145)
Total Bilirubin: 0.5 mg/dL (ref 0.3–1.2)
Total Protein: 6.8 g/dL (ref 6.5–8.1)

## 2018-10-26 LAB — PROTIME-INR
INR: 1.2 (ref 0.8–1.2)
Prothrombin Time: 14.8 seconds (ref 11.4–15.2)

## 2018-10-26 SURGERY — INSERTION, PLEURAL DRAINAGE CATHETER
Anesthesia: Monitor Anesthesia Care | Site: Chest | Laterality: Left

## 2018-10-26 MED ORDER — FENTANYL CITRATE (PF) 100 MCG/2ML IJ SOLN: 25.0000 ug | INTRAMUSCULAR | Status: DC | PRN

## 2018-10-26 MED ORDER — 0.9 % SODIUM CHLORIDE (POUR BTL) OPTIME
TOPICAL | Status: DC | PRN
  Administered 2018-10-26: 1000 mL

## 2018-10-26 MED ORDER — MIDAZOLAM HCL 5 MG/5ML IJ SOLN
INTRAMUSCULAR | Status: DC | PRN
  Administered 2018-10-26: 2 mg via INTRAVENOUS

## 2018-10-26 MED ORDER — LIDOCAINE 2% (20 MG/ML) 5 ML SYRINGE
INTRAMUSCULAR | Status: AC
  Filled 2018-10-26: qty 5

## 2018-10-26 MED ORDER — OXYCODONE HCL 5 MG PO TABS: 5.0000 mg | ORAL_TABLET | ORAL | Status: DC | PRN

## 2018-10-26 MED ORDER — FENTANYL CITRATE (PF) 250 MCG/5ML IJ SOLN
INTRAMUSCULAR | Status: AC
  Filled 2018-10-26: qty 5

## 2018-10-26 MED ORDER — SODIUM CHLORIDE 0.9% FLUSH: 3.0000 mL | Freq: Two times a day (BID) | INTRAVENOUS | Status: DC

## 2018-10-26 MED ORDER — EPHEDRINE 5 MG/ML INJ
INTRAVENOUS | Status: AC
  Filled 2018-10-26: qty 20

## 2018-10-26 MED ORDER — PROPOFOL 500 MG/50ML IV EMUL
INTRAVENOUS | Status: DC | PRN
  Administered 2018-10-26: 30 ug/kg/min via INTRAVENOUS

## 2018-10-26 MED ORDER — PROPOFOL 10 MG/ML IV BOLUS
INTRAVENOUS | Status: AC
  Filled 2018-10-26: qty 20

## 2018-10-26 MED ORDER — ACETAMINOPHEN 325 MG PO TABS: 650.0000 mg | ORAL_TABLET | ORAL | Status: DC | PRN

## 2018-10-26 MED ORDER — SODIUM CHLORIDE 0.9 % IV SOLN: 250.0000 mL | INTRAVENOUS | Status: DC | PRN

## 2018-10-26 MED ORDER — LACTATED RINGERS IV SOLN
INTRAVENOUS | Status: DC
  Administered 2018-10-26: 12:00:00 via INTRAVENOUS

## 2018-10-26 MED ORDER — LIDOCAINE HCL 1 % IJ SOLN
INTRAMUSCULAR | Status: DC | PRN
  Administered 2018-10-26: 7 mL

## 2018-10-26 MED ORDER — ACETAMINOPHEN 650 MG RE SUPP: 650.0000 mg | RECTAL | Status: DC | PRN

## 2018-10-26 MED ORDER — LIDOCAINE HCL (PF) 1 % IJ SOLN
INTRAMUSCULAR | Status: AC
  Filled 2018-10-26: qty 30

## 2018-10-26 MED ORDER — FENTANYL CITRATE (PF) 100 MCG/2ML IJ SOLN
INTRAMUSCULAR | Status: DC | PRN
  Administered 2018-10-26: 100 ug via INTRAVENOUS
  Administered 2018-10-26: 50 ug via INTRAVENOUS

## 2018-10-26 MED ORDER — LIDOCAINE 2% (20 MG/ML) 5 ML SYRINGE
INTRAMUSCULAR | Status: DC | PRN
  Administered 2018-10-26: 100 mg via INTRAVENOUS

## 2018-10-26 MED ORDER — STERILE WATER FOR IRRIGATION IR SOLN
Status: DC | PRN
  Administered 2018-10-26: 1000 mL

## 2018-10-26 MED ORDER — SODIUM CHLORIDE 0.9% FLUSH: 3.0000 mL | INTRAVENOUS | Status: DC | PRN

## 2018-10-26 MED ORDER — CEFAZOLIN SODIUM-DEXTROSE 2-4 GM/100ML-% IV SOLN
INTRAVENOUS | Status: AC
  Filled 2018-10-26: qty 100

## 2018-10-26 MED ORDER — CEFAZOLIN SODIUM-DEXTROSE 2-4 GM/100ML-% IV SOLN
2.0000 g | INTRAVENOUS | Status: AC
  Administered 2018-10-26: 2 g via INTRAVENOUS

## 2018-10-26 MED ORDER — ACETAMINOPHEN 500 MG PO TABS: 1000.0000 mg | ORAL_TABLET | Freq: Four times a day (QID) | ORAL | Status: DC

## 2018-10-26 MED ORDER — MIDAZOLAM HCL 2 MG/2ML IJ SOLN
INTRAMUSCULAR | Status: AC
  Filled 2018-10-26: qty 2

## 2018-10-26 SURGICAL SUPPLY — 33 items
ADH SKN CLS APL DERMABOND .7 (GAUZE/BANDAGES/DRESSINGS) ×1
BLADE CLIPPER SURG (BLADE) ×1 IMPLANT
CANISTER SUCT 3000ML PPV (MISCELLANEOUS) ×3 IMPLANT
COVER SURGICAL LIGHT HANDLE (MISCELLANEOUS) ×3 IMPLANT
COVER WAND RF STERILE (DRAPES) ×1 IMPLANT
DERMABOND ADVANCED (GAUZE/BANDAGES/DRESSINGS) ×2
DERMABOND ADVANCED .7 DNX12 (GAUZE/BANDAGES/DRESSINGS) ×1 IMPLANT
DRAPE C-ARM 42X72 X-RAY (DRAPES) ×3 IMPLANT
DRAPE LAPAROSCOPIC ABDOMINAL (DRAPES) ×3 IMPLANT
DRSG TEGADERM 4X4.75 (GAUZE/BANDAGES/DRESSINGS) ×2 IMPLANT
ELECT REM PT RETURN 9FT ADLT (ELECTROSURGICAL) ×3
ELECTRODE REM PT RTRN 9FT ADLT (ELECTROSURGICAL) ×1 IMPLANT
GLOVE BIO SURGEON STRL SZ7.5 (GLOVE) ×6 IMPLANT
GOWN STRL REUS W/ TWL LRG LVL3 (GOWN DISPOSABLE) ×2 IMPLANT
GOWN STRL REUS W/TWL LRG LVL3 (GOWN DISPOSABLE) ×6
KIT BASIN OR (CUSTOM PROCEDURE TRAY) ×3 IMPLANT
KIT PLEURX DRAIN CATH 1000ML (MISCELLANEOUS) ×5 IMPLANT
KIT PLEURX DRAIN CATH 15.5FR (DRAIN) ×3 IMPLANT
KIT TURNOVER KIT B (KITS) ×3 IMPLANT
NDL HYPO 25GX1X1/2 BEV (NEEDLE) ×1 IMPLANT
NEEDLE HYPO 25GX1X1/2 BEV (NEEDLE) ×3 IMPLANT
NS IRRIG 1000ML POUR BTL (IV SOLUTION) ×3 IMPLANT
PACK GENERAL/GYN (CUSTOM PROCEDURE TRAY) ×3 IMPLANT
PAD ARMBOARD 7.5X6 YLW CONV (MISCELLANEOUS) ×6 IMPLANT
SET DRAINAGE LINE (MISCELLANEOUS) IMPLANT
SUT ETHILON 3 0 PS 1 (SUTURE) ×3 IMPLANT
SUT SILK 2 0 SH (SUTURE) ×3 IMPLANT
SUT VIC AB 3-0 SH 8-18 (SUTURE) ×3 IMPLANT
SYR CONTROL 10ML LL (SYRINGE) ×3 IMPLANT
TOWEL GREEN STERILE (TOWEL DISPOSABLE) ×3 IMPLANT
TOWEL GREEN STERILE FF (TOWEL DISPOSABLE) ×3 IMPLANT
VALVE REPLACEMENT CAP (MISCELLANEOUS) IMPLANT
WATER STERILE IRR 1000ML POUR (IV SOLUTION) ×3 IMPLANT

## 2018-10-26 NOTE — Brief Op Note (Signed)
10/26/2018  2:05 PM  PATIENT:  Nancy Moore  53 y.o. female  PRE-OPERATIVE DIAGNOSIS:  RECURRENT LEFT PLEURAL EFFUSION  POST-OPERATIVE DIAGNOSIS:  RECURRENT LEFT PLEURAL EFFUSION  PROCEDURE:  Procedure(s): INSERTION PLEURAL DRAINAGE CATHETER (Left)  Drain 1.8 L left malignant pleural effusion  SURGEON:  Surgeon(s) and Role:    Ivin Poot, MD - Primary  PHYSICIAN ASSISTANT:   ASSISTANTS: none   ANESTHESIA:   MAC  EBL:  1 mL   BLOOD ADMINISTERED:none  DRAINS: Left pleurx catheter   LOCAL MEDICATIONS USED:  LIDOCAINE  and Amount: 8 ml  SPECIMEN:  No Specimen  DISPOSITION OF SPECIMEN:  N/A  COUNTS:  YES  TOURNIQUET:  * No tourniquets in log *  DICTATION: .Dragon Dictation  PLAN OF CARE: Discharge to home after PACU  PATIENT DISPOSITION:  PACU - hemodynamically stable.   Delay start of Pharmacological VTE agent (>24hrs) due to surgical blood loss or risk of bleeding: yes

## 2018-10-26 NOTE — Anesthesia Preprocedure Evaluation (Addendum)
Anesthesia Evaluation  Patient identified by MRN, date of birth, ID band Patient awake    Reviewed: Allergy & Precautions, NPO status , Patient's Chart, lab work & pertinent test results  Airway Mallampati: II  TM Distance: >3 FB Neck ROM: Full    Dental no notable dental hx. (+) Teeth Intact, Dental Advisory Given   Pulmonary shortness of breath, pneumonia,  Stage IV left lung CA c/b malignant pleural effusion requiring thoracentesis q 2-3 weeks, s/p pericardial window 2018  hx pleural effusion, now with left pleural effusion   Pulmonary exam normal breath sounds clear to auscultation       Cardiovascular hypertension, negative cardio ROS Normal cardiovascular exam Rhythm:Regular Rate:Normal  TTE 2018 - Left ventricle: The cavity size was normal. Systolic function was normal. The estimated ejection fraction was in the range of 60% to 65%. - Pericardium, extracardiac: A large, free-flowing pericardial effusion was identified circumferential to the heart.   Neuro/Psych negative neurological ROS  negative psych ROS   GI/Hepatic Neg liver ROS, GERD  Medicated,H/o Crohn's   Endo/Other  negative endocrine ROS  Renal/GU Renal disease  negative genitourinary   Musculoskeletal  (+) Arthritis ,   Abdominal Normal abdominal exam  (+)   Peds  Hematology negative hematology ROS (+)   Anesthesia Other Findings   Reproductive/Obstetrics S/p hysterectomy                                                            Anesthesia Evaluation  Patient identified by MRN, date of birth, ID band Patient awake    Reviewed: Allergy & Precautions, NPO status , Patient's Chart, lab work & pertinent test results  Airway Mallampati: II  TM Distance: >3 FB     Dental  (+) Dental Advisory Given, Teeth Intact   Pulmonary shortness of breath,    breath sounds clear to auscultation        Cardiovascular hypertension,  Rhythm:Regular Rate:Normal     Neuro/Psych    GI/Hepatic negative GI ROS, Neg liver ROS,   Endo/Other    Renal/GU Renal disease     Musculoskeletal   Abdominal   Peds  Hematology   Anesthesia Other Findings   Reproductive/Obstetrics                            Anesthesia Physical Anesthesia Plan  ASA: III  Anesthesia Plan: General   Post-op Pain Management:    Induction: Intravenous  PONV Risk Score and Plan: 3 and Treatment may vary due to age or medical condition  Airway Management Planned: Oral ETT  Additional Equipment: Arterial line, CVP and TEE  Intra-op Plan:   Post-operative Plan: Possible Post-op intubation/ventilation  Informed Consent: I have reviewed the patients History and Physical, chart, labs and discussed the procedure including the risks, benefits and alternatives for the proposed anesthesia with the patient or authorized representative who has indicated his/her understanding and acceptance.   Dental advisory given  Plan Discussed with: CRNA, Anesthesiologist and Surgeon  Anesthesia Plan Comments:        Anesthesia Quick Evaluation  Anesthesia Physical Anesthesia Plan  ASA: III  Anesthesia Plan: MAC   Post-op Pain Management:    Induction: Intravenous  PONV Risk Score and Plan: 2 and Propofol infusion, Treatment  may vary due to age or medical condition and TIVA  Airway Management Planned: Natural Airway and Nasal Cannula  Additional Equipment: None  Intra-op Plan:   Post-operative Plan:   Informed Consent: I have reviewed the patients History and Physical, chart, labs and discussed the procedure including the risks, benefits and alternatives for the proposed anesthesia with the patient or authorized representative who has indicated his/her understanding and acceptance.     Dental advisory given  Plan Discussed with: CRNA  Anesthesia Plan Comments:         Anesthesia Quick Evaluation

## 2018-10-26 NOTE — Progress Notes (Signed)
Pre Procedure note for inpatients:   Nancy Moore has been scheduled for Procedure(s): INSERTION PLEURAL DRAINAGE CATHETER (Left) today. The various methods of treatment have been discussed with the patient. After consideration of the risks, benefits and treatment options the patient has consented to the planned procedure.   The patient has been seen and labs reviewed. There are no changes in the patient's condition to prevent proceeding with the planned procedure today.  Recent labs:  Lab Results  Component Value Date   WBC 7.9 10/26/2018   HGB 13.8 10/26/2018   HCT 43.6 10/26/2018   PLT 334 10/26/2018   GLUCOSE 116 (H) 10/14/2018   CHOL 210 (H) 03/26/2018   TRIG 67.0 03/26/2018   HDL 71.30 03/26/2018   LDLCALC 125 (H) 03/26/2018   ALT 10 10/14/2018   AST 16 10/14/2018   NA 138 10/14/2018   K 4.1 10/14/2018   CL 104 10/14/2018   CREATININE 1.29 (H) 10/14/2018   BUN 12 10/14/2018   CO2 24 10/14/2018   TSH 3.637 10/14/2018   INR 1.2 10/26/2018    Len Childs, MD 10/26/2018 12:20 PM

## 2018-10-26 NOTE — Op Note (Signed)
NAME: Nancy Moore, Nancy Moore MEDICAL RECORD [id number] ACCOUNT [id number] DATE OF BIRTH:[birth year] FACILITY: [facility] LOCATION: [facility] PHYSICIAN:Mammie Meras VAN TRIGT III, MD  OPERATIVE REPORT  DATE OF PROCEDURE:  10/26/2018  OPERATION:  Placement of left PleurX catheter and drainage of 1.8 L of malignant pleural effusion.  PREOPERATIVE DIAGNOSIS:  Left lung cancer, recurrent left malignant pleural effusion, symptomatic.  POSTOPERATIVE DIAGNOSIS:  Left lung cancer, recurrent left malignant pleural effusion, symptomatic.  ANESTHESIA:  MAC with local 1% lidocaine.  SURGEON:  Ivin Poot III, MD  DESCRIPTION OF PROCEDURE:  The patient was brought from preop holding where informed consent was documented.  Proper site marked, and all final questions addressed.  The patient was placed on the operating room table with a gentle bump under the left  chest.  She was given IV conscious sedation and monitored by anesthesia.  The left chest was prepped and draped as a sterile field.  A proper timeout was performed.  Lidocaine 1% was infiltrated in the anterior axillary line at the fifth interspace and  again at the mid axillary line at the left costal margin. Next, 2 small incisions were made in these areas that were anesthetized.  Next, through the upper incision using the Seldinger technique, a guidewire was placed into the left pleural space and confirmed by C-arm fluoroscopy.  Next, the PleurX catheter was tunneled from the lower incision to the upper incision.  Next, a dilator, then the dilator sheath was placed in the pleural space, and the PleurX catheter was fed into the tear-away sheath into the pleural space and placement confirmed by C-arm fluoroscopy.  Next, the pleural space was drained of 1.8 L of yellow clear fluid.  Next, the 2 incisions were closed in a standard fashion.  The catheter was capped and dressed with sterile dressings and a waterproof sheet was placed.  The patient was then  transferred to the recovery room in stable condition.  LN/NUANCE  D:10/26/2018 T:10/26/2018 JOB:[id number]

## 2018-10-26 NOTE — Anesthesia Postprocedure Evaluation (Signed)
Anesthesia Post Note  Patient: Nancy Moore  Procedure(s) Performed: INSERTION PLEURAL DRAINAGE CATHETER (Left Chest)     Patient location during evaluation: PACU Anesthesia Type: MAC Level of consciousness: awake and alert Pain management: pain level controlled Vital Signs Assessment: post-procedure vital signs reviewed and stable Respiratory status: spontaneous breathing, nonlabored ventilation and respiratory function stable Cardiovascular status: blood pressure returned to baseline and stable Postop Assessment: no apparent nausea or vomiting Anesthetic complications: no    Last Vitals:  Vitals:   10/26/18 1430 10/26/18 1439  BP: 121/74 119/67  Pulse: 88 89  Resp: 15 13  Temp:  36.4 C  SpO2: 98% 96%    Last Pain:  Vitals:   10/26/18 1400  TempSrc:   PainSc: 0-No pain                 Jarome Matin Jyasia Markoff

## 2018-10-26 NOTE — Transfer of Care (Signed)
Immediate Anesthesia Transfer of Care Note  Patient: Nancy Moore  Procedure(s) Performed: INSERTION PLEURAL DRAINAGE CATHETER (Left Chest)  Patient Location: PACU  Anesthesia Type:MAC  Level of Consciousness: awake and patient cooperative  Airway & Oxygen Therapy: Patient Spontanous Breathing  Post-op Assessment: Report given to RN and Post -op Vital signs reviewed and stable  Post vital signs: Reviewed and stable  Last Vitals:  Vitals Value Taken Time  BP    Temp    Pulse 102 10/26/18 1356  Resp 23 10/26/18 1356  SpO2 100 % 10/26/18 1356  Vitals shown include unvalidated device data.  Last Pain:  Vitals:   10/26/18 1116  TempSrc:   PainSc: 0-No pain      Patients Stated Pain Goal: 3 (97/74/14 2395)  Complications: No apparent anesthesia complications

## 2018-10-26 NOTE — H&P (Signed)
PCP is Copland, Gay Filler, MD Referring Provider is No ref. provider found  No chief complaint on file. Patient examined, images of most recent CT scan of chest personally reviewed.  HPI: Patient presents for evaluation of a recurrent left malignant pleural effusion.  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 Earlie Server with [facility].  In 2018 she required a pericardial window for a symptomatic malignant pericardial effusion.  She now has been developing recurrent left malignant pleural effusions requiring thoracentesis every 2 to 3 weeks.  The last thoracentesis was 6 days ago which removed 1.7 L of fluid.  Chest x-ray today shows evidence of recurring effusion.  The most recent CT scan of the chest shows no significant pericardial effusion.  A Pleurx catheter has been recommended by her oncologist which seems appropriate and beneficial to the patient.  The patient has had no signs of upper respiratory infection.  She is on no anticoagulation.  She will be scheduled for outpatient placement of left Pleurx catheter at [facility] on September 29.  I discussed the procedure in detail, showed her the catheter and discussed the care and management of the catheter at home with the help of home health nursing.   Past Medical History:  Diagnosis Date  . Adenocarcinoma of left lung, stage 4 ([place]) 10/28/2016  . Anemia   . Arthritis   . Constipation   . Crohn's disease ([place])    in remission 35 years +  . Dyspnea    gets "winded"  . GERD (gastroesophageal reflux disease)   . Goals of care, counseling/discussion 10/28/2016  . Hypertension   . Pneumonia    walking pneumonia in the late 80's  . Recurrent pleural effusion on left     Past Surgical History:  Procedure Laterality Date  . ABDOMINAL HYSTERECTOMY     partial hysterectomy  . BREAST EXCISIONAL BIOPSY Left 20+ yrs ago   benign  . COLONOSCOPY    . PERICARDIAL WINDOW N/A 01/10/2017   Procedure:  PERICARDIAL WINDOW- SUB XYPHOID, RIGHT CHEST TUBE;  Surgeon: Ivin Poot, MD;  Location: [facility];  Service: Thoracic;  Laterality: N/A;  . VIDEO BRONCHOSCOPY Bilateral 10/21/2016   Procedure: VIDEO BRONCHOSCOPY WITH FLUORO;  Surgeon: Tanda Rockers, MD;  Location: [facility];  Service: Cardiopulmonary;  Laterality: Bilateral;    Family History  Problem Relation Age of Onset  . Asthma Mother   . Stroke Mother   . Hypertension Mother   . Heart attack Father   . Hypertension Father   . Hyperlipidemia Father   . Dementia Father   . Emphysema Maternal Grandmother   . Hypertension Maternal Grandmother   . Colon cancer Paternal Grandmother   . Brain cancer Maternal Uncle     Social History Social History   Tobacco Use  . Smoking status: Never Smoker  . Smokeless tobacco: Never Used  Substance Use Topics  . Alcohol use: Yes    Comment: socially  . Drug use: No    Current Facility-Administered Medications  Medication Dose Route Frequency Provider Last Rate Last Dose  . ceFAZolin (ANCEF) IVPB 2g/100 mL premix  2 g Intravenous 30 min Pre-Op Prescott Gum, Whittley Carandang, MD      . lactated ringers infusion   Intravenous Continuous Freddrick March, MD 10 mL/hr at 10/26/18 1135      Allergies  Allergen Reactions  . Percocet [Oxycodone-Acetaminophen] Other (See Comments)    "makes me dizzy"  . Lisinopril Cough  .  Losartan Cough    Review of Systems  Weight stable No fever Chronic dry cough related to pleural effusion Daily dosing of Tagrisso, tolerating well No edema No history of heart disease No history of diabetes  BP 123/73   Pulse 96   Temp 97.8 F (36.6 C) (Oral)   Resp 16   Ht 5' 6"  (1.676 m)   Wt 106.6 kg   LMP 03/15/2010   SpO2 96%   BMI 37.93 kg/m  Physical Exam Breath sounds diminished over left posterior lung field No palpable nodes in the neck Heart rhythm regular without murmur or friction rub Abdomen soft obese nontender Extremities warm well  perfused Neuro intact  Diagnostic Tests: CT scan and chest x-ray images personally viewed as noted above  Impression: Recommend proceeding with left Pleurx catheter placement for recurrent left malignant effusion  Plan: Catheter placement scheduled for September 29 at [facility].  Patient understands the benefits and risks of the procedure and further Pleurx catheter management of her recurrent pleural effusion.   Len Childs, MD [facility] [phone]

## 2018-10-26 NOTE — Discharge Instructions (Signed)
You may shower sept 20 No driving 24 hours Home nurse will contact you about home visit to drain catheter later this week Office will call for office visit, chest xray in 3 weeks

## 2018-10-27 ENCOUNTER — Telehealth: Payer: Self-pay

## 2018-10-27 ENCOUNTER — Encounter (HOSPITAL_COMMUNITY): Payer: Self-pay | Admitting: Cardiothoracic Surgery

## 2018-10-27 ENCOUNTER — Telehealth: Payer: Self-pay | Admitting: Medical Oncology

## 2018-10-27 NOTE — Telephone Encounter (Signed)
Case manager, Reeves Forth contacted the office due to unavailability of home health for patient to be educated on draining.  Patient had PleurX catheter placed by Dr. Prescott Gum 10/26/18 and needs to be taught how to drain catheter every-other-day.  Patient made aware and will set up a time for her and family to come into the office to be drained and taught.  If subsequent visits for teaching is needed, that can be addressed at patient's visit.  Her follow-up appointment with Dr. Prescott Gum is 11/17/18.  Will made Dr. Prescott Gum aware.

## 2018-10-27 NOTE — Telephone Encounter (Signed)
vomited x 1 after taking tagrisso this am  ( recent anesthesia). Asking if she should take another dose today . I told her DO NOT take anymore tagrisso today.

## 2018-10-28 ENCOUNTER — Other Ambulatory Visit: Payer: Self-pay

## 2018-10-28 ENCOUNTER — Inpatient Hospital Stay: Payer: 59 | Attending: Oncology | Admitting: Internal Medicine

## 2018-10-28 ENCOUNTER — Ambulatory Visit (INDEPENDENT_AMBULATORY_CARE_PROVIDER_SITE_OTHER): Payer: 59

## 2018-10-28 ENCOUNTER — Inpatient Hospital Stay: Payer: 59

## 2018-10-28 ENCOUNTER — Encounter: Payer: Self-pay | Admitting: Internal Medicine

## 2018-10-28 VITALS — BP 113/41 | HR 95 | Temp 98.2°F | Resp 18 | Ht 66.0 in | Wt 231.9 lb

## 2018-10-28 DIAGNOSIS — R21 Rash and other nonspecific skin eruption: Secondary | ICD-10-CM | POA: Diagnosis not present

## 2018-10-28 DIAGNOSIS — J90 Pleural effusion, not elsewhere classified: Secondary | ICD-10-CM

## 2018-10-28 DIAGNOSIS — C3492 Malignant neoplasm of unspecified part of left bronchus or lung: Secondary | ICD-10-CM | POA: Diagnosis not present

## 2018-10-28 DIAGNOSIS — I10 Essential (primary) hypertension: Secondary | ICD-10-CM | POA: Diagnosis not present

## 2018-10-28 DIAGNOSIS — C7931 Secondary malignant neoplasm of brain: Secondary | ICD-10-CM | POA: Diagnosis not present

## 2018-10-28 DIAGNOSIS — C3490 Malignant neoplasm of unspecified part of unspecified bronchus or lung: Secondary | ICD-10-CM | POA: Diagnosis not present

## 2018-10-28 DIAGNOSIS — J910 Malignant pleural effusion: Secondary | ICD-10-CM | POA: Diagnosis not present

## 2018-10-28 DIAGNOSIS — Z719 Counseling, unspecified: Secondary | ICD-10-CM

## 2018-10-28 DIAGNOSIS — Z4889 Encounter for other specified surgical aftercare: Secondary | ICD-10-CM | POA: Insufficient documentation

## 2018-10-28 DIAGNOSIS — K5090 Crohn's disease, unspecified, without complications: Secondary | ICD-10-CM | POA: Insufficient documentation

## 2018-10-28 DIAGNOSIS — Z5189 Encounter for other specified aftercare: Secondary | ICD-10-CM

## 2018-10-28 DIAGNOSIS — Z5111 Encounter for antineoplastic chemotherapy: Secondary | ICD-10-CM

## 2018-10-28 LAB — CMP (CANCER CENTER ONLY)
ALT: 6 U/L (ref 0–44)
AST: 11 U/L — ABNORMAL LOW (ref 15–41)
Albumin: 3 g/dL — ABNORMAL LOW (ref 3.5–5.0)
Alkaline Phosphatase: 84 U/L (ref 38–126)
Anion gap: 10 (ref 5–15)
BUN: 11 mg/dL (ref 6–20)
CO2: 27 mmol/L (ref 22–32)
Calcium: 9.3 mg/dL (ref 8.9–10.3)
Chloride: 105 mmol/L (ref 98–111)
Creatinine: 1.18 mg/dL — ABNORMAL HIGH (ref 0.44–1.00)
GFR, Est AFR Am: >60 mL/min (ref >60)
GFR, Estimated: 53 mL/min — ABNORMAL LOW (ref >60)
Glucose, Bld: 102 mg/dL — ABNORMAL HIGH (ref 70–99)
Potassium: 4.4 mmol/L (ref 3.5–5.1)
Sodium: 142 mmol/L (ref 135–145)
Total Bilirubin: 0.4 mg/dL (ref 0.3–1.2)
Total Protein: 6.7 g/dL (ref 6.5–8.1)

## 2018-10-28 LAB — CBC WITH DIFFERENTIAL (CANCER CENTER ONLY)
Abs Immature Granulocytes: 0.03 10*3/uL (ref 0.00–0.07)
Basophils Absolute: 0 10*3/uL (ref 0.0–0.1)
Basophils Relative: 0 %
Eosinophils Absolute: 0.4 10*3/uL (ref 0.0–0.5)
Eosinophils Relative: 4 %
HCT: 38.9 % (ref 36.0–46.0)
Hemoglobin: 12 g/dL (ref 12.0–15.0)
Immature Granulocytes: 0 %
Lymphocytes Relative: 16 %
Lymphs Abs: 1.5 10*3/uL (ref 0.7–4.0)
MCH: 28.3 pg (ref 26.0–34.0)
MCHC: 30.8 g/dL (ref 30.0–36.0)
MCV: 91.7 fL (ref 80.0–100.0)
Monocytes Absolute: 0.9 10*3/uL (ref 0.1–1.0)
Monocytes Relative: 9 %
Neutro Abs: 6.7 10*3/uL (ref 1.7–7.7)
Neutrophils Relative %: 71 %
Platelet Count: 310 10*3/uL (ref 150–400)
RBC: 4.24 MIL/uL (ref 3.87–5.11)
RDW: 14.5 % (ref 11.5–15.5)
WBC Count: 9.6 10*3/uL (ref 4.0–10.5)
nRBC: 0 % (ref 0.0–0.2)

## 2018-10-28 NOTE — Progress Notes (Signed)
Patient and niece came into the office today to learn how to drain her PleurX catheter that was placed by Dr. Prescott Gum on 10/26/2018.  She is to be drained every other day starting today.  Patient and niece taught how to drain.  Her next drain is Saturday which patient will have to do independently.  Both patient and niece expressed understanding of drainage of the Pleurx and acknowledged receipt.  She stated that she would contact the office back on Monday if she felt she needed additional teaching.  Drained 550 ml's of amber pleural fluid off left lung/ chest/ catheter.

## 2018-10-28 NOTE — Progress Notes (Signed)
Nancy Moore Telephone:([phone]) [phone]   Fax:([phone]) [phone]  OFFICE PROGRESS NOTE  Copland, Gay Filler, MD [place] [address] [place] [address]  DIAGNOSIS: DIAGNOSIS: stage IV (T3, N2, M1 a) non-small cell lung cancer, poorly differentiated adenocarcinoma with extensive miliary distribution in Nancy lungs bilaterally diagnosed in September 2018. 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 NEGATIVE for Nancy Exon 20 [id number] mutation. Repeat molecular studies by guardant 360 at time of progression showed ATM [id number]  PRIOR THERAPY: Gilotrif 40 mg daily started 11/08/2016.Status post 13 months of treatment.  CURRENT THERAPY: Tagrisso 80 mg p.o. daily.  First dose started January 01, 2018.  Status post 9 months of treatment.  INTERVAL HISTORY: Nancy Moore 53y.o. female returns to Nancy clinic today for follow-up visit.  Nancy Moore is complaining of increasing fatigue and shortness of breath with exertion recently.  She had recurrent left pleural effusion and Nancy Moore underwent ultrasound-guided thoracentesis on 10/14/2018 with drainage of 1.5 L of malignant pleural effusion.  She also underwent Pleurx catheter placement by Dr. VPrescott Gumon 10/26/2018 with drainage of 1.8 L of fluid.  She is feeling much better after Nancy drainage of Nancy fluid.  She denied having any current chest pain and her shortness of breath is better.  She has mild cough with no hemoptysis.  She denied having any fever or chills.  She has no nausea, vomiting, diarrhea but has constipation recently secondary to oxycodone.  She is here today for evaluation and repeat blood work.   MEDICAL HISTORY: Past Medical History:  Diagnosis Date   Adenocarcinoma of left lung, stage 4 ([facility] 10/28/2016   Anemia    Arthritis    Constipation    Crohn's disease ([facility]    in remission 35 years +   Dyspnea    gets "winded"   GERD (gastroesophageal reflux disease)    Goals of care,  counseling/discussion 10/28/2016   Hypertension    Pneumonia    walking pneumonia in Nancy late 80's   Recurrent pleural effusion on left     ALLERGIES:  is allergic to percocet [oxycodone-acetaminophen]; lisinopril; and losartan.  MEDICATIONS:  Current Outpatient Medications  Medication Sig Dispense Refill   clindamycin (CLINDAGEL) 1 % gel APPLY TO Nancy AFFECTED AREA(S) TWICE DAILY (Moore taking differently: Apply 1 application topically 2 (two) times daily. ) 30 g 1   fluticasone (FLONASE) 50 MCG/ACT nasal spray Place 2 sprays into both nostrils daily.     furosemide (LASIX) 40 MG tablet TAKE 1 TABLET BY MOUTH ONCE DAILY (Moore taking differently: Take 40 mg by mouth daily. ) 90 tablet 1   loperamide (IMODIUM) 1 MG/5ML solution Take 2 mg as needed by mouth for diarrhea or loose stools.     magnesium oxide (MAG-OX) 400 (241.3 Mg) MG tablet Take 1 tablet (400 mg total) by mouth daily.     methocarbamol (ROBAXIN) 500 MG tablet TAKE 1 TABLET (500 MG TOTAL) BY MOUTH EVERY 8 (EIGHT) HOURS AS NEEDED FOR MUSCLE SPASMS. 30 tablet 5   nystatin cream (MYCOSTATIN) Apply 1 application topically 2 (two) times daily. (Moore taking differently: Apply 1 application topically daily as needed for dry skin. ) 30 g 0   Oxycodone HCl 10 MG TABS TAKE 1 TABLET BY MOUTH 3 TIMES DAILY AS NEEDED (Moore taking differently: Take 10 mg by mouth 3 (three) times daily as needed (pain). ) 60 tablet 0   pantoprazole (PROTONIX) 40 MG tablet TAKE  1 TABLET (40 MG TOTAL) BY MOUTH DAILY AT 12 NOON. (Moore taking differently: Take 40 mg by mouth daily. ) 90 tablet 3   potassium chloride SA (K-DUR) 20 MEQ tablet TAKE 1 TABLET (20 MEQ TOTAL) BY MOUTH 2 TIMES DAILY. (Moore taking differently: Take 20 mEq by mouth 2 (two) times daily. ) 180 tablet 3   prochlorperazine (COMPAZINE) 10 MG tablet Take 1 tablet (10 mg total) every 6 (six) hours as needed by mouth for nausea or vomiting. 30 tablet 1   senna-docusate  (SENOKOT-S) 8.6-50 MG tablet Take 1 tablet by mouth at bedtime. (Moore taking differently: Take 1 tablet by mouth daily as needed for mild constipation. )     TAGRISSO 80 MG tablet TAKE 1 TABLET BY MOUTH ONCE DAILY 30 tablet 0   No current facility-administered medications for this visit.     SURGICAL HISTORY:  Past Surgical History:  Procedure Laterality Date   ABDOMINAL HYSTERECTOMY     partial hysterectomy   BREAST EXCISIONAL BIOPSY Left 20+ yrs ago   benign   CHEST TUBE INSERTION Left 10/26/2018   Procedure: INSERTION PLEURAL DRAINAGE CATHETER;  Surgeon: Ivin Poot, MD;  Location: [facility];  Service: Thoracic;  Laterality: Left;   COLONOSCOPY     PERICARDIAL WINDOW N/A 01/10/2017   Procedure: PERICARDIAL WINDOW- SUB XYPHOID, RIGHT CHEST TUBE;  Surgeon: Ivin Poot, MD;  Location: [facility];  Service: Thoracic;  Laterality: N/A;   VIDEO BRONCHOSCOPY Bilateral 10/21/2016   Procedure: VIDEO BRONCHOSCOPY WITH FLUORO;  Surgeon: Tanda Rockers, MD;  Location: [facility];  Service: Cardiopulmonary;  Laterality: Bilateral;    REVIEW OF SYSTEMS:  A comprehensive review of systems was negative except for: Constitutional: positive for fatigue Respiratory: positive for dyspnea on exertion   PHYSICAL EXAMINATION: General appearance: alert, cooperative, fatigued and no distress Head: Normocephalic, without obvious abnormality, atraumatic Neck: no adenopathy, no JVD, supple, symmetrical, trachea midline and thyroid not enlarged, symmetric, no tenderness/mass/nodules Lymph nodes: Cervical, supraclavicular, and axillary nodes normal. Resp: clear to auscultation bilaterally Back: symmetric, no curvature. ROM normal. No CVA tenderness. Cardio: regular rate and rhythm, S1, S2 normal, no murmur, click, rub or gallop GI: soft, non-tender; bowel sounds normal; no masses,  no organomegaly Extremities: extremities normal, atraumatic, no cyanosis or edema  ECOG PERFORMANCE STATUS: 1 -  Symptomatic but completely ambulatory  Blood pressure (!) 113/41, pulse 95, temperature 98.2 F (36.8 C), temperature source Temporal, resp. rate 18, height 5' 6"  (1.676 m), weight 231 lb 14.4 oz (105.2 kg), last menstrual period 03/15/2010, SpO2 98 %.  LABORATORY DATA: Lab Results  Component Value Date   WBC 9.6 10/28/2018   HGB 12.0 10/28/2018   HCT 38.9 10/28/2018   MCV 91.7 10/28/2018   PLT 310 10/28/2018      Chemistry      Component Value Date/Time   NA 139 10/26/2018 1335   NA 135 (L) 01/08/2017 1055   K 3.7 10/26/2018 1335   K 3.2 (L) 01/08/2017 1055   CL 101 10/26/2018 1335   CO2 27 10/26/2018 1335   CO2 25 01/08/2017 1055   BUN 10 10/26/2018 1335   BUN 14.6 01/08/2017 1055   CREATININE 1.32 (H) 10/26/2018 1335   CREATININE 1.43 (H) 09/24/2018 0830   CREATININE 1.3 (H) 01/08/2017 1055      Component Value Date/Time   CALCIUM 9.1 10/26/2018 1335   CALCIUM 9.5 01/08/2017 1055   ALKPHOS 83 10/26/2018 1335   ALKPHOS 82 01/08/2017 1055   AST  19 10/26/2018 1335   AST 15 09/24/2018 0830   AST 31 01/08/2017 1055   ALT 12 10/26/2018 1335   ALT 8 09/24/2018 0830   ALT 34 01/08/2017 1055   BILITOT 0.5 10/26/2018 1335   BILITOT 0.4 09/24/2018 0830   BILITOT 1.74 (H) 01/08/2017 1055       RADIOGRAPHIC STUDIES: Dg Chest 1 View  Result Date: 10/14/2018 CLINICAL DATA:  Status post left thoracentesis EXAM: CHEST  1 VIEW COMPARISON:  10/13/2018 FINDINGS: Slight interval improvement of a large left pleural effusion with associated atelectasis or consolidation. No significant pneumothorax. There is fine heterogeneous and interstitial opacity of Nancy bilateral lungs. Nancy cardiac borders are largely obscured by effusion. IMPRESSION: 1. Slight interval improvement of a large left pleural effusion with associated atelectasis or consolidation following thoracentesis. No significant pneumothorax. 2. There is fine heterogeneous and interstitial opacity of Nancy bilateral lungs,  consistent with infection or edema. Electronically Signed   By: Eddie Candle M.D.   On: 10/14/2018 12:53   Dg Chest 1 View  Result Date: 09/30/2018 CLINICAL DATA:  Status post left thoracentesis EXAM: CHEST  1 VIEW COMPARISON:  09/24/2018 FINDINGS: Cardiac shadow is within normal limits. Left-sided pleural effusion is noted although significantly reduced from Nancy prior preprocedure CT. No pneumothorax is noted. Some mild persistent atelectatic changes are noted in Nancy left base. Nancy right lung remains clear. IMPRESSION: No pneumothorax following left-sided thoracentesis. Electronically Signed   By: Inez Catalina M.D.   On: 09/30/2018 12:03   Dg Chest 2 View  Result Date: 10/26/2018 CLINICAL DATA:  Preoperative study prior to insertion of pleural drainage catheter. EXAM: CHEST - 2 VIEW COMPARISON:  October 20, 2018 FINDINGS: There is a moderate to large left effusion which is similar in Nancy interval. Increased interstitial markings are seen in Nancy lungs suggesting pulmonary venous congestion/mild edema. No other interval changes. No pneumothorax. IMPRESSION: Nancy moderate to large left effusion is similar in Nancy interval. There appears to be new pulmonary venous congestion/mild edema. No other abnormalities. Electronically Signed   By: Dorise Bullion III M.D   On: 10/26/2018 12:18   Dg Chest 2 View  Result Date: 10/20/2018 CLINICAL DATA:  Left lung cancer.  Shortness of breath. EXAM: CHEST - 2 VIEW COMPARISON:  Radiograph of September 17th 2020. FINDINGS: Large left pleural effusion is noted with probable underlying atelectasis or infiltrate. No pneumothorax is noted. Right lung is clear. Bony thorax is unremarkable. IMPRESSION: Stable large left pleural effusion is noted with probable underlying atelectasis or infiltrate. Electronically Signed   By: Marijo Conception M.D.   On: 10/20/2018 09:35   Dg Chest 2 View  Result Date: 10/13/2018 CLINICAL DATA:  Chest pain short of breath EXAM: CHEST - 2 VIEW  COMPARISON:  09/30/2018, CT 09/24/2018 FINDINGS: Large left-sided pleural effusion, increased compared to prior. Airspace disease in Nancy left mid to lower lung. Mild ground-glass opacity in Nancy left upper lobe. Patchy interstitial and ground-glass opacity in Nancy right thorax. CT demonstrated pulmonary nodules are better seen on CT. Nancy cardiomediastinal silhouette is enlarged. No pneumothorax. IMPRESSION: 1. Large left-sided pleural effusion, increased compared to prior, with increased airspace disease in Nancy left mid to lower lung zone. 2. Interstitial and ground-glass opacity in Nancy left upper lobe, and patchy interstitial and ground-glass opacity in Nancy right thorax grossly similar as compared with prior chest CT. Electronically Signed   By: Donavan Foil M.D.   On: 10/13/2018 15:38   Ct Angio Chest Pe W  And/or Wo Contrast  Result Date: 10/13/2018 CLINICAL DATA:  History of lung cancer. Now presents with acute dyspnea. High pretest probability for pulmonary embolus. EXAM: CT ANGIOGRAPHY CHEST WITH CONTRAST TECHNIQUE: Multidetector CT imaging of Nancy chest was performed using Nancy standard protocol during bolus administration of intravenous contrast. Multiplanar CT image reconstructions and MIPs were obtained to evaluate Nancy vascular anatomy. CONTRAST:  183m OMNIPAQUE IOHEXOL 350 MG/ML SOLN COMPARISON:  CT chest 09/24/2018 FINDINGS: Cardiovascular: Exam detail is diminished due to respiratory motion artifact. Nancy main pulmonary artery appears patent. No saddle embolus. No central obstructing pulmonary embolus identified. No definite lobar pulmonary artery embolus. Focal low-density filling defect within segmental branch of Nancy left lower lobe pulmonary artery is noted, image 122/6 and image 70/8. Given Nancy motion artifact this is equivocal for pulmonary artery embolus. Mediastinum/Nodes: Normal appearance of Nancy thyroid gland. Nancy trachea appears patent and is midline. Normal appearance of Nancy esophagus. No  enlarged mediastinal or hilar lymph nodes. Lungs/Pleura: Large recurrent left pleural effusion is identified status post thoracentesis on 09/30/2018. Pleural effusion is larger than on Nancy exam from 09/24/2018 and there is considerable volume loss throughout Nancy left lung. Paramediastinal tumor within Nancy left upper lobe with extension into Nancy superior mediastinum is new from previous exam measuring 3.2 x 1.4 by 3.1 cm. Again noted is diffuse interstitial thickening and micro nodularity with ground-glass attenuation in Nancy partially atelectatic left upper lobe, increased from previous exam. Septal thickening and micro nodularity throughout Nancy right lung is also again noted compatible with metastatic disease. Previous index within Nancy superior segment of right lower lobe measures 1.0 cm, image 37/11. Similar to previous study. Nancy index nodule in Nancy superior segment of left lower lobe measures 1 cm and is similar to prior exam. Upper Abdomen: No acute abnormality. Musculoskeletal: No chest wall abnormality. No acute or significant osseous findings. Review of Nancy MIP images confirms Nancy above findings. IMPRESSION: 1. Significantly diminished exam detail due to diffuse respiratory motion artifact. This markedly diminishes evaluation of Nancy lobar and segmental pulmonary arteries. No saddle embolus or central obstructing pulmonary artery embolus noted. Tiny low-density focus within a segmental branch of Nancy right lower lobe pulmonary artery is equivocal for PE. 2. Significant increase in volume of left pleural effusion compared with 09/24/2018 with corresponding atelectasis of Nancy left lower lobe and left upper lobe. 3. New subpleural mass within Nancy paramediastinal left upper lobe with mediastinal invasion is noted concerning for progression of disease. 4. Similar appearance of diffuse micro nodularity and septal thickening compatible with metastatic disease. Lymphangitic spread of disease is not excluded.  Electronically Signed   By: TKerby MoorsM.D.   On: 10/13/2018 19:18   Dg Chest Port 1 View  Result Date: 10/26/2018 CLINICAL DATA:  LEFT pleural catheter drain placement for malignant effusion. EXAM: PORTABLE CHEST 1 VIEW COMPARISON:  10/26/2018 and prior radiographs FINDINGS: A LEFT thoracostomy tube is now noted with tip located medially. Significant decrease in LEFT pleural effusion noted with LEFT LOWER lung atelectasis/opacity identified. There is no evidence of pneumothorax. Nancy RIGHT lung is clear. No acute bony abnormalities are identified. IMPRESSION: LEFT thoracostomy tube placement with significant decrease in LEFT pleural effusion. No pneumothorax. LEFT LOWER lung atelectasis/opacity. Electronically Signed   By: JMargarette CanadaM.D.   On: 10/26/2018 14:46   Dg C-arm 1-60 Min-no Report  Result Date: 10/26/2018 Fluoroscopy was utilized by Nancy requesting physician.  No radiographic interpretation.   [place]oracentesis Asp Pleural Space W/img Guide  Result  Date: 10/14/2018 INDICATION: Moore with history of lung cancer, dyspnea, her current left malignant pleural effusion. Request is made for therapeutic left thoracentesis. EXAM: ULTRASOUND GUIDED THERAPEUTIC LEFT THORACENTESIS MEDICATIONS: 10 mL 1% lidocaine COMPLICATIONS: None immediate. PROCEDURE: An ultrasound guided thoracentesis was thoroughly discussed with Nancy Moore and questions answered. Nancy benefits, risks, alternatives and complications were also discussed. Nancy Moore understands and wishes to proceed with Nancy procedure. Written consent was obtained. Ultrasound was performed to localize and mark an adequate pocket of fluid in Nancy left chest. Nancy area was then prepped and draped in Nancy normal sterile fashion. 1% Lidocaine was used for local anesthesia. Under ultrasound guidance a 6 Fr Safe-T-Centesis catheter was introduced. Thoracentesis was performed. Nancy catheter was removed and a dressing applied. FINDINGS: A total of  approximately 1.5 L of hazy amber fluid was removed. IMPRESSION: Successful ultrasound guided left thoracentesis yielding 1.5 L of pleural fluid. Read by: Earley Abide, PA-C Electronically Signed   By: Jerilynn Mages.  Shick M.D.   On: 10/14/2018 14:12   US Thoracentesis Asp Pleural Space W/img Guide  Result Date: 09/30/2018 INDICATION: Lung cancer. Left pleural effusion. Request for therapeutic thoracentesis. EXAM: ULTRASOUND GUIDED LEFT THORACENTESIS MEDICATIONS: 1% lidocaine 10 mL COMPLICATIONS: None immediate. PROCEDURE: An ultrasound guided thoracentesis was thoroughly discussed with Nancy Moore and questions answered. Nancy benefits, risks, alternatives and complications were also discussed. Nancy Moore understands and wishes to proceed with Nancy procedure. Written consent was obtained. Ultrasound was performed to localize and mark an adequate pocket of fluid in Nancy left chest. Nancy area was then prepped and draped in Nancy normal sterile fashion. 1% Lidocaine was used for local anesthesia. Under ultrasound guidance a 6 Fr Safe-T-Centesis catheter was introduced. Thoracentesis was performed. Nancy catheter was removed and a dressing applied. FINDINGS: A total of approximately 1.4 L of red fluid was removed. IMPRESSION: Successful ultrasound guided left thoracentesis yielding 1.4 L of pleural fluid. No pneumothorax on post-procedure chest x-ray. Read by: Gareth Eagle, PA-C Electronically Signed   By: Jacqulynn Cadet M.D.   On: 09/30/2018 12:39    Nancy Moore: This is a very pleasant 53 years old African-American female with metastatic non-small cell lung cancer, adenocarcinoma and positive for EGFR mutation with deletion in exon 19.  Nancy Moore is currently on treatment with GILOTRIF 40 mg p.o. daily status post 13 months.  Nancy Moore is tolerating this treatment well except for few episodes of diarrhea and mild skin rash. She is complaining of dizzy spells and vertigo recently. Repeat CT scan of Nancy chest,  abdomen and pelvis performed recently showed some evidence for disease progression with increase in size and number of pulmonary nodules. Her treatment was switched to Tagrisso 80 mg p.o. daily started January 01, 2018.  She is status post 9 months of treatment Nancy Moore has been tolerating Nancy treatment well with no concerning adverse effects but her most recent scan showed recurrent left pleural effusion and she recently underwent left Pleurx catheter placement for drainage of Nancy pleural fluid. She is feeling much better.  I will see her back for follow-up visit in 1 months for evaluation with repeat blood work. She was advised to call immediately if she has any concerning symptoms in Nancy interval. Nancy Moore voices understanding of current disease status and treatment options and is in agreement with Nancy current care Moore. All questions were answered. Nancy Moore knows to call Nancy clinic with any problems, questions or concerns. We can certainly see Nancy Moore much sooner  if necessary.  Disclaimer: This note was dictated with voice recognition software. Similar sounding words can inadvertently be transcribed and may not be corrected upon review.

## 2018-10-29 ENCOUNTER — Telehealth: Payer: Self-pay | Admitting: Internal Medicine

## 2018-10-29 NOTE — Telephone Encounter (Signed)
Scheduled appt per 10/1 los. - pt aware of appt date and time

## 2018-11-01 MED FILL — FUROSEMIDE 40 MG TAB: 40 | 30 days supply | Qty: 90 | Fill #1

## 2018-11-01 MED FILL — METHOCARBAMOL 500 MG TABS: 500 | 10 days supply | Qty: 30 | Fill #5

## 2018-11-01 MED FILL — PANTOPRAZOLE SOD DR 40 MG T: 40 | 30 days supply | Qty: 30 | Fill #2

## 2018-11-01 MED FILL — POTASSIUM CHLORIDE CRYS ER: 20 | 30 days supply | Qty: 60 | Fill #2

## 2018-11-02 ENCOUNTER — Encounter (HOSPITAL_COMMUNITY): Payer: Self-pay | Admitting: Cardiothoracic Surgery

## 2018-11-02 NOTE — Addendum Note (Signed)
Addendum  created 11/02/18 1146 by Pervis Hocking, DO   Intraprocedure Event edited, Intraprocedure Staff edited

## 2018-11-04 ENCOUNTER — Other Ambulatory Visit: Payer: Self-pay

## 2018-11-04 ENCOUNTER — Ambulatory Visit
Admission: RE | Admit: 2018-11-04 | Discharge: 2018-11-04 | Disposition: A | Discharge status: Home / Self Care | Payer: 59 | Source: Ambulatory Visit | Attending: Cardiothoracic Surgery | Admitting: Cardiothoracic Surgery

## 2018-11-04 ENCOUNTER — Ambulatory Visit (INDEPENDENT_AMBULATORY_CARE_PROVIDER_SITE_OTHER): Payer: 59 | Admitting: Cardiothoracic Surgery

## 2018-11-04 ENCOUNTER — Encounter: Payer: Self-pay | Admitting: Cardiothoracic Surgery

## 2018-11-04 VITALS — BP 115/77 | HR 90 | Temp 97.7°F | Resp 16 | Ht 66.0 in | Wt 231.0 lb

## 2018-11-04 DIAGNOSIS — J90 Pleural effusion, not elsewhere classified: Secondary | ICD-10-CM

## 2018-11-04 NOTE — Progress Notes (Signed)
PCP is Copland, Gay Filler, MD Referring Provider is Maryanna Shape, NP  Chief Complaint  Patient presents with  . Pleural Effusion    CXR,c/o reduced amounts of pleural fluid, she feels like the tube might be block     . Routine Post Op    Drained PleurX today in office and got 350cc's of pleural fluid    HPI: 2 weeks after left Pleurx catheter placement for malignant partially loculated effusion, history of left lung cancer on long-term Tagrisso. Patient had difficulty with drainage recently.  She came and had a chest x-ray that showed a mild left pleural effusion.  She then came to the office with Pleurx catheter was drained 350 cc of amber fluid.  I removed the sutures from the insertion.  The incisions are healing well.  The patient will continue on a drainage schedule of Monday Wednesday Friday.  She has an appointment to return in about a month.  She was given information about how to order her kids.  Past Medical History:  Diagnosis Date  . Adenocarcinoma of left lung, stage 4 ([place]) 10/28/2016  . Anemia   . Arthritis   . Constipation   . Crohn's disease ([place])    in remission 35 years +  . Dyspnea    gets "winded"  . GERD (gastroesophageal reflux disease)   . Goals of care, counseling/discussion 10/28/2016  . Hypertension   . Pneumonia    walking pneumonia in the late 80's  . Recurrent pleural effusion on left     Past Surgical History:  Procedure Laterality Date  . ABDOMINAL HYSTERECTOMY     partial hysterectomy  . BREAST EXCISIONAL BIOPSY Left 20+ yrs ago   benign  . CHEST TUBE INSERTION Left 10/26/2018   Procedure: INSERTION PLEURAL DRAINAGE CATHETER;  Surgeon: Ivin Poot, MD;  Location: [facility];  Service: Thoracic;  Laterality: Left;  . COLONOSCOPY    . PERICARDIAL WINDOW N/A 01/10/2017   Procedure: PERICARDIAL WINDOW- SUB XYPHOID, RIGHT CHEST TUBE;  Surgeon: Ivin Poot, MD;  Location: [facility];  Service: Thoracic;  Laterality: N/A;  . VIDEO BRONCHOSCOPY  Bilateral 10/21/2016   Procedure: VIDEO BRONCHOSCOPY WITH FLUORO;  Surgeon: Tanda Rockers, MD;  [facility];  Service: Cardiopulmonary;  Laterality: Bilateral;    Family History  Problem Relation Age of Onset  . Asthma Mother   . Stroke Mother   . Hypertension Mother   . Heart attack Father   . Hypertension Father   . Hyperlipidemia Father   . Dementia Father   . Emphysema Maternal Grandmother   . Hypertension Maternal Grandmother   . Colon cancer Paternal Grandmother   . Brain cancer Maternal Uncle     Social History Social History   Tobacco Use  . Smoking status: Never Smoker  . Smokeless tobacco: Never Used  Substance Use Topics  . Alcohol use: Yes    Comment: socially  . Drug use: No    Current Outpatient Medications  Medication Sig Dispense Refill  . clindamycin (CLINDAGEL) 1 % gel APPLY TO THE AFFECTED AREA(S) TWICE DAILY (Patient taking differently: Apply 1 application topically 2 (two) times daily. ) 30 g 1  . fluticasone (FLONASE) 50 MCG/ACT nasal spray Place 2 sprays into both nostrils daily.    . furosemide (LASIX) 40 MG tablet TAKE 1 TABLET BY MOUTH ONCE DAILY (Patient taking differently: Take 40 mg by mouth daily. ) 90 tablet 1  . loperamide (IMODIUM) 1 MG/5ML solution Take 2 mg  as needed by mouth for diarrhea or loose stools.    . magnesium oxide (MAG-OX) 400 (241.3 Mg) MG tablet Take 1 tablet (400 mg total) by mouth daily.    . methocarbamol (ROBAXIN) 500 MG tablet TAKE 1 TABLET (500 MG TOTAL) BY MOUTH EVERY 8 (EIGHT) HOURS AS NEEDED FOR MUSCLE SPASMS. 30 tablet 5  . nystatin cream (MYCOSTATIN) Apply 1 application topically 2 (two) times daily. (Patient taking differently: Apply 1 application topically daily as needed for dry skin. ) 30 g 0  . Oxycodone HCl 10 MG TABS TAKE 1 TABLET BY MOUTH 3 TIMES DAILY AS NEEDED (Patient taking differently: Take 10 mg by mouth 3 (three) times daily as needed (pain). ) 60 tablet 0  . pantoprazole (PROTONIX) 40 MG  tablet TAKE 1 TABLET (40 MG TOTAL) BY MOUTH DAILY AT 12 NOON. (Patient taking differently: Take 40 mg by mouth daily. ) 90 tablet 3  . potassium chloride SA (K-DUR) 20 MEQ tablet TAKE 1 TABLET (20 MEQ TOTAL) BY MOUTH 2 TIMES DAILY. (Patient taking differently: Take 20 mEq by mouth 2 (two) times daily. ) 180 tablet 3  . prochlorperazine (COMPAZINE) 10 MG tablet Take 1 tablet (10 mg total) every 6 (six) hours as needed by mouth for nausea or vomiting. 30 tablet 1  . senna-docusate (SENOKOT-S) 8.6-50 MG tablet Take 1 tablet by mouth at bedtime. (Patient taking differently: Take 1 tablet by mouth daily as needed for mild constipation. )    . TAGRISSO 80 MG tablet TAKE 1 TABLET BY MOUTH ONCE DAILY 30 tablet 0   No current facility-administered medications for this visit.     Allergies  Allergen Reactions  . Percocet [Oxycodone-Acetaminophen] Other (See Comments)    "makes me dizzy"  . Lisinopril Cough  . Losartan Cough    Review of Systems   No fever Minimal pain with Pleurx drainage sessions Minimal shortness of breath with drainage of pleural effusion  BP 115/77   Pulse 90   Temp 97.7 F (36.5 C) (Skin)   Resp 16   Ht 5' 6"  (1.676 m)   Wt 231 lb (104.8 kg)   LMP 03/15/2010   SpO2 94% Comment: RA  BMI 37.28 kg/m  Physical Exam Breath sounds equal bilaterally Heart rhythm regular Pleurx catheter site clean and dry  Diagnostic Tests: Chest x-ray observe today showing probable obstructive atelectasis in the left lower lobe and a small effusion which was drained later in the office after the x-ray.  Impression: Left malignant effusion being treated effectively with Pleurx drainage  Plan: Continue Monday Wednesday Friday drainage schedule Return with x-ray is scheduled  Len Childs, MD [facility] [phone]

## 2018-11-16 ENCOUNTER — Other Ambulatory Visit: Payer: Self-pay | Admitting: Cardiothoracic Surgery

## 2018-11-16 DIAGNOSIS — C3492 Malignant neoplasm of unspecified part of left bronchus or lung: Secondary | ICD-10-CM

## 2018-11-17 ENCOUNTER — Ambulatory Visit: Payer: 59 | Admitting: Cardiothoracic Surgery

## 2018-12-01 ENCOUNTER — Other Ambulatory Visit: Payer: Self-pay | Admitting: Family Medicine

## 2018-12-01 ENCOUNTER — Other Ambulatory Visit: Payer: Self-pay

## 2018-12-01 ENCOUNTER — Encounter: Payer: Self-pay | Admitting: Cardiothoracic Surgery

## 2018-12-01 ENCOUNTER — Inpatient Hospital Stay: Payer: 59

## 2018-12-01 ENCOUNTER — Encounter: Payer: Self-pay | Admitting: Internal Medicine

## 2018-12-01 ENCOUNTER — Inpatient Hospital Stay: Payer: 59 | Attending: Oncology | Admitting: Internal Medicine

## 2018-12-01 ENCOUNTER — Other Ambulatory Visit: Payer: Self-pay | Admitting: Medical

## 2018-12-01 ENCOUNTER — Other Ambulatory Visit: Payer: Self-pay | Admitting: Internal Medicine

## 2018-12-01 ENCOUNTER — Ambulatory Visit (INDEPENDENT_AMBULATORY_CARE_PROVIDER_SITE_OTHER): Payer: 59 | Admitting: Cardiothoracic Surgery

## 2018-12-01 ENCOUNTER — Ambulatory Visit
Admission: RE | Admit: 2018-12-01 | Discharge: 2018-12-01 | Disposition: A | Discharge status: Home / Self Care | Payer: 59 | Source: Ambulatory Visit | Attending: Cardiothoracic Surgery | Admitting: Cardiothoracic Surgery

## 2018-12-01 VITALS — BP 119/78 | HR 89 | Temp 98.5°F | Resp 18 | Ht 66.0 in | Wt 228.5 lb

## 2018-12-01 VITALS — BP 125/83 | HR 105 | Temp 97.5°F | Resp 16 | Ht 66.0 in | Wt 228.4 lb

## 2018-12-01 DIAGNOSIS — C3492 Malignant neoplasm of unspecified part of left bronchus or lung: Secondary | ICD-10-CM

## 2018-12-01 DIAGNOSIS — C3490 Malignant neoplasm of unspecified part of unspecified bronchus or lung: Secondary | ICD-10-CM

## 2018-12-01 DIAGNOSIS — K5090 Crohn's disease, unspecified, without complications: Secondary | ICD-10-CM | POA: Insufficient documentation

## 2018-12-01 DIAGNOSIS — C7931 Secondary malignant neoplasm of brain: Secondary | ICD-10-CM

## 2018-12-01 DIAGNOSIS — J90 Pleural effusion, not elsewhere classified: Secondary | ICD-10-CM | POA: Diagnosis not present

## 2018-12-01 DIAGNOSIS — R21 Rash and other nonspecific skin eruption: Secondary | ICD-10-CM | POA: Diagnosis not present

## 2018-12-01 DIAGNOSIS — J910 Malignant pleural effusion: Secondary | ICD-10-CM

## 2018-12-01 DIAGNOSIS — M62838 Other muscle spasm: Secondary | ICD-10-CM

## 2018-12-01 DIAGNOSIS — Z5111 Encounter for antineoplastic chemotherapy: Secondary | ICD-10-CM

## 2018-12-01 DIAGNOSIS — I10 Essential (primary) hypertension: Secondary | ICD-10-CM | POA: Diagnosis not present

## 2018-12-01 DIAGNOSIS — R609 Edema, unspecified: Secondary | ICD-10-CM

## 2018-12-01 DIAGNOSIS — R52 Pain, unspecified: Secondary | ICD-10-CM

## 2018-12-01 LAB — CMP (CANCER CENTER ONLY)
ALT: 9 U/L (ref 0–44)
AST: 12 U/L — ABNORMAL LOW (ref 15–41)
Albumin: 3.3 g/dL — ABNORMAL LOW (ref 3.5–5.0)
Alkaline Phosphatase: 86 U/L (ref 38–126)
Anion gap: 11 (ref 5–15)
BUN: 12 mg/dL (ref 6–20)
CO2: 26 mmol/L (ref 22–32)
Calcium: 9.3 mg/dL (ref 8.9–10.3)
Chloride: 103 mmol/L (ref 98–111)
Creatinine: 1.25 mg/dL — ABNORMAL HIGH (ref 0.44–1.00)
GFR, Est AFR Am: 57 mL/min — ABNORMAL LOW (ref >60)
GFR, Estimated: 49 mL/min — ABNORMAL LOW (ref >60)
Glucose, Bld: 94 mg/dL (ref 70–99)
Potassium: 3.8 mmol/L (ref 3.5–5.1)
Sodium: 140 mmol/L (ref 135–145)
Total Bilirubin: 0.3 mg/dL (ref 0.3–1.2)
Total Protein: 6.9 g/dL (ref 6.5–8.1)

## 2018-12-01 LAB — CBC WITH DIFFERENTIAL (CANCER CENTER ONLY)
Abs Immature Granulocytes: 0.03 10*3/uL (ref 0.00–0.07)
Basophils Absolute: 0 10*3/uL (ref 0.0–0.1)
Basophils Relative: 0 %
Eosinophils Absolute: 0.2 10*3/uL (ref 0.0–0.5)
Eosinophils Relative: 3 %
HCT: 40.5 % (ref 36.0–46.0)
Hemoglobin: 12.7 g/dL (ref 12.0–15.0)
Immature Granulocytes: 0 %
Lymphocytes Relative: 21 %
Lymphs Abs: 1.9 10*3/uL (ref 0.7–4.0)
MCH: 28.1 pg (ref 26.0–34.0)
MCHC: 31.4 g/dL (ref 30.0–36.0)
MCV: 89.6 fL (ref 80.0–100.0)
Monocytes Absolute: 1 10*3/uL (ref 0.1–1.0)
Monocytes Relative: 11 %
Neutro Abs: 5.8 10*3/uL (ref 1.7–7.7)
Neutrophils Relative %: 65 %
Platelet Count: 336 10*3/uL (ref 150–400)
RBC: 4.52 MIL/uL (ref 3.87–5.11)
RDW: 15.3 % (ref 11.5–15.5)
WBC Count: 8.9 10*3/uL (ref 4.0–10.5)
nRBC: 0 % (ref 0.0–0.2)

## 2018-12-01 MED FILL — CLINDAMYCIN PH 1% GEL: 1 | 20 days supply | Qty: 30 | Fill #1

## 2018-12-01 MED FILL — POTASSIUM CHLORIDE CRYS ER: 20 | 30 days supply | Qty: 60 | Fill #3

## 2018-12-01 MED FILL — PANTOPRAZOLE SOD DR 40 MG T: 40 | 30 days supply | Qty: 30 | Fill #3

## 2018-12-01 NOTE — Progress Notes (Signed)
PCP is Copland, Gay Filler, MD Referring Provider is Maryanna Shape, NP  Chief Complaint  Patient presents with  . Pleural Effusion    F/U s/p PleurX placemement 10/26/18    HPI: Left Pleurx catheter for malignant effusion functioning well.  Drainage approximately 150 to 200 cc Monday Wednesday Friday.  Patient still on oral chemotherapy-Tagrisso Will change drainage schedules to twice a week on Mondays and Thursday. Chest x-ray image today reviewed showing catheter in good position with minimal left effusion   Past Medical History:  Diagnosis Date  . Adenocarcinoma of left lung, stage 4 ([place]) 10/28/2016  . Anemia   . Arthritis   . Constipation   . Crohn's disease ([place])    in remission 35 years +  . Dyspnea    gets "winded"  . GERD (gastroesophageal reflux disease)   . Goals of care, counseling/discussion 10/28/2016  . Hypertension   . Pneumonia    walking pneumonia in the late 80's  . Recurrent pleural effusion on left     Past Surgical History:  Procedure Laterality Date  . ABDOMINAL HYSTERECTOMY     partial hysterectomy  . BREAST EXCISIONAL BIOPSY Left 20+ yrs ago   benign  . CHEST TUBE INSERTION Left 10/26/2018   Procedure: INSERTION PLEURAL DRAINAGE CATHETER;  Surgeon: Ivin Poot, MD;  Location: [facility];  Service: Thoracic;  Laterality: Left;  . COLONOSCOPY    . PERICARDIAL WINDOW N/A 01/10/2017   Procedure: PERICARDIAL WINDOW- SUB XYPHOID, RIGHT CHEST TUBE;  Surgeon: Ivin Poot, MD;  Location: [facility];  Service: Thoracic;  Laterality: N/A;  . VIDEO BRONCHOSCOPY Bilateral 10/21/2016   Procedure: VIDEO BRONCHOSCOPY WITH FLUORO;  Surgeon: Tanda Rockers, MD;  [facility];  Service: Cardiopulmonary;  Laterality: Bilateral;    Family History  Problem Relation Age of Onset  . Asthma Mother   . Stroke Mother   . Hypertension Mother   . Heart attack Father   . Hypertension Father   . Hyperlipidemia Father   . Dementia Father   . Emphysema  Maternal Grandmother   . Hypertension Maternal Grandmother   . Colon cancer Paternal Grandmother   . Brain cancer Maternal Uncle     Social History Social History   Tobacco Use  . Smoking status: Never Smoker  . Smokeless tobacco: Never Used  Substance Use Topics  . Alcohol use: Yes    Comment: socially  . Drug use: No    Current Outpatient Medications  Medication Sig Dispense Refill  . clindamycin (CLINDAGEL) 1 % gel APPLY TO THE AFFECTED AREA(S) TWICE DAILY (Patient taking differently: Apply 1 application topically 2 (two) times daily. ) 30 g 1  . fluticasone (FLONASE) 50 MCG/ACT nasal spray Place 2 sprays into both nostrils daily.    . furosemide (LASIX) 40 MG tablet TAKE 1 TABLET BY MOUTH ONCE DAILY (Patient taking differently: Take 40 mg by mouth daily. ) 90 tablet 1  . loperamide (IMODIUM) 1 MG/5ML solution Take 2 mg as needed by mouth for diarrhea or loose stools.    . magnesium oxide (MAG-OX) 400 (241.3 Mg) MG tablet Take 1 tablet (400 mg total) by mouth daily.    . methocarbamol (ROBAXIN) 500 MG tablet TAKE 1 TABLET (500 MG TOTAL) BY MOUTH EVERY 8 (EIGHT) HOURS AS NEEDED FOR MUSCLE SPASMS. 30 tablet 5  . nystatin cream (MYCOSTATIN) Apply 1 application topically 2 (two) times daily. (Patient taking differently: Apply 1 application topically daily as needed for dry skin. ) 30  g 0  . Oxycodone HCl 10 MG TABS TAKE 1 TABLET BY MOUTH 3 TIMES DAILY AS NEEDED (Patient taking differently: Take 10 mg by mouth 3 (three) times daily as needed (pain). ) 60 tablet 0  . pantoprazole (PROTONIX) 40 MG tablet TAKE 1 TABLET (40 MG TOTAL) BY MOUTH DAILY AT 12 NOON. (Patient taking differently: Take 40 mg by mouth daily. ) 90 tablet 3  . potassium chloride SA (K-DUR) 20 MEQ tablet TAKE 1 TABLET (20 MEQ TOTAL) BY MOUTH 2 TIMES DAILY. (Patient taking differently: Take 20 mEq by mouth 2 (two) times daily. ) 180 tablet 3  . prochlorperazine (COMPAZINE) 10 MG tablet Take 1 tablet (10 mg total) every 6  (six) hours as needed by mouth for nausea or vomiting. 30 tablet 1  . senna-docusate (SENOKOT-S) 8.6-50 MG tablet Take 1 tablet by mouth at bedtime. (Patient taking differently: Take 1 tablet by mouth daily as needed for mild constipation. )    . TAGRISSO 80 MG tablet TAKE 1 TABLET BY MOUTH ONCE DAILY 30 tablet 0   No current facility-administered medications for this visit.     Allergies  Allergen Reactions  . Percocet [Oxycodone-Acetaminophen] Other (See Comments)    "makes me dizzy"  . Lisinopril Cough  . Losartan Cough    Review of Systems   No new complaints   BP 125/83 (BP Location: Left Arm)   Pulse (!) 105   Temp (!) 97.5 F (36.4 C) (Skin)   Resp 16   Ht 5' 6"  (1.676 m)   Wt 228 lb 6.4 oz (103.6 kg)   LMP 03/15/2010   SpO2 94% Comment: RA  BMI 36.86 kg/m  Physical Exam      Exam    General- alert and comfortable    Neck- no JVD, no cervical adenopathy palpable, no carotid bruit   Lungs- clear without rales, wheezes   Cor- regular rate and rhythm, no murmur , gallop   Abdomen- soft, non-tender   Extremities - warm, non-tender, minimal edema   Neuro- oriented, appropriate, no focal weakness   Diagnostic Tests: Chest x-ray with left Pleurx catheter in good position  Impression: Pleurx catheter managing malignant effusion well with minimal symptoms.  Plan: Reduce drainage schedule to twice a week and return with chest x-ray in a month   Len Childs, MD [facility] [phone]

## 2018-12-01 NOTE — Progress Notes (Signed)
[facility] Telephone:([phone]) [phone]   Fax:(336) [phone]  OFFICE PROGRESS NOTE  Copland, Gay Filler, MD [address] [place] [address]  DIAGNOSIS: DIAGNOSIS: stage IV (T3, N2, M1 a) non-small cell lung cancer, poorly differentiated adenocarcinoma with extensive miliary distribution in the lungs bilaterally diagnosed in September 2018. 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 NEGATIVE for the Exon 20 T790M mutation. Repeat molecular studies by guardant 360 at time of progression showed ATM [id number]  PRIOR THERAPY: Gilotrif 40 mg daily started 11/08/2016.Status post 13 months of treatment.  CURRENT THERAPY: Tagrisso 80 mg p.o. daily.  First dose started January 01, 2018.  Status post 11 months of treatment.  INTERVAL HISTORY: [name] 53y.o. female returns to the clinic today for follow-up visit.  The patient is feeling fine today with no concerning complaints except for mild fatigue and constipation.  She is using Senokot as needed for the constipation.  She denied having any current chest pain except for some heaviness on the right side but no significant shortness of breath except with exertion and no cough or hemoptysis.  She continues to have drainage of the left pleural effusion around 300 mL every other day.  She denied having any current fever or chills.  She has no nausea, vomiting, or diarrhea.  She denied having any recent weight loss or night sweats.  She is here today for evaluation and repeat blood work.    MEDICAL HISTORY: Past Medical History:  Diagnosis Date   Adenocarcinoma of [place] lung, stage 4 ([place] 10/28/2016   Anemia    Arthritis    Constipation    [place]'s disease ([place]    in remission 35 years +   Dyspnea    gets "winded"   GERD (gastroesophageal reflux disease)    Goals of care, counseling/discussion 10/28/2016   Hypertension    Pneumonia    walking pneumonia in the late 80's   Recurrent pleural  effusion on left     ALLERGIES:  is allergic to percocet [oxycodone-acetaminophen]; lisinopril; and losartan.  MEDICATIONS:  Current Outpatient Medications  Medication Sig Dispense Refill   clindamycin (CLINDAGEL) 1 % gel APPLY TO THE AFFECTED AREA(S) TWICE DAILY (Patient taking differently: Apply 1 application topically 2 (two) times daily. ) 30 g 1   fluticasone (FLONASE) 50 MCG/ACT nasal spray Place 2 sprays into both nostrils daily.     furosemide (LASIX) 40 MG tablet TAKE 1 TABLET BY MOUTH ONCE DAILY (Patient taking differently: Take 40 mg by mouth daily. ) 90 tablet 1   loperamide (IMODIUM) 1 MG/5ML solution Take 2 mg as needed by mouth for diarrhea or loose stools.     magnesium oxide (MAG-OX) 400 (241.3 Mg) MG tablet Take 1 tablet (400 mg total) by mouth daily.     methocarbamol (ROBAXIN) 500 MG tablet TAKE 1 TABLET (500 MG TOTAL) BY MOUTH EVERY 8 (EIGHT) HOURS AS NEEDED FOR MUSCLE SPASMS. 30 tablet 5   nystatin cream (MYCOSTATIN) Apply 1 application topically 2 (two) times daily. (Patient taking differently: Apply 1 application topically daily as needed for dry skin. ) 30 g 0   Oxycodone HCl 10 MG TABS TAKE 1 TABLET BY MOUTH 3 TIMES DAILY AS NEEDED (Patient taking differently: Take 10 mg by mouth 3 (three) times daily as needed (pain). ) 60 tablet 0   pantoprazole (PROTONIX) 40 MG tablet TAKE 1 TABLET (40 MG TOTAL) BY MOUTH DAILY AT 12 NOON. (Patient taking  differently: Take 40 mg by mouth daily. ) 90 tablet 3   potassium chloride SA (K-DUR) 20 MEQ tablet TAKE 1 TABLET (20 MEQ TOTAL) BY MOUTH 2 TIMES DAILY. (Patient taking differently: Take 20 mEq by mouth 2 (two) times daily. ) 180 tablet 3   prochlorperazine (COMPAZINE) 10 MG tablet Take 1 tablet (10 mg total) every 6 (six) hours as needed by mouth for nausea or vomiting. 30 tablet 1   senna-docusate (SENOKOT-S) 8.6-50 MG tablet Take 1 tablet by mouth at bedtime. (Patient taking differently: Take 1 tablet by mouth daily as  needed for mild constipation. )     TAGRISSO 80 MG tablet TAKE 1 TABLET BY MOUTH ONCE DAILY 30 tablet 0   No current facility-administered medications for this visit.     SURGICAL HISTORY:  Past Surgical History:  Procedure Laterality Date   ABDOMINAL HYSTERECTOMY     partial hysterectomy   BREAST EXCISIONAL BIOPSY Left 20+ yrs ago   benign   CHEST TUBE INSERTION Left 10/26/2018   Procedure: INSERTION PLEURAL DRAINAGE CATHETER;  Surgeon: Ivin Poot, MD;  Location: [facility];  Service: Thoracic;  Laterality: Left;   COLONOSCOPY     PERICARDIAL WINDOW N/A 01/10/2017   Procedure: PERICARDIAL WINDOW- SUB XYPHOID, RIGHT CHEST TUBE;  Surgeon: Ivin Poot, MD;  Location: [facility];  Service: Thoracic;  Laterality: N/A;   VIDEO BRONCHOSCOPY Bilateral 10/21/2016   Procedure: VIDEO BRONCHOSCOPY WITH FLUORO;  Surgeon: Tanda Rockers, MD;  [facility];  Service: Cardiopulmonary;  Laterality: Bilateral;    REVIEW OF SYSTEMS:  A comprehensive review of systems was negative except for: Constitutional: positive for fatigue Respiratory: positive for dyspnea on exertion and pleurisy/chest pain Gastrointestinal: positive for constipation   PHYSICAL EXAMINATION: General appearance: alert, cooperative, fatigued and no distress Head: Normocephalic, without obvious abnormality, atraumatic Neck: no adenopathy, no JVD, supple, symmetrical, trachea midline and thyroid not enlarged, symmetric, no tenderness/mass/nodules Lymph nodes: Cervical, supraclavicular, and axillary nodes normal. Resp: clear to auscultation bilaterally Back: symmetric, no curvature. ROM normal. No CVA tenderness. Cardio: regular rate and rhythm, S1, S2 normal, no murmur, click, rub or gallop GI: soft, non-tender; bowel sounds normal; no masses,  no organomegaly Extremities: extremities normal, atraumatic, no cyanosis or edema  ECOG PERFORMANCE STATUS: 1 - Symptomatic but completely ambulatory  Blood pressure  119/78, pulse 89, temperature 98.5 F (36.9 C), temperature source Temporal, resp. rate 18, height _0  (1.676 m), weight 228 lb 8 oz (103.6 kg), last menstrual period 03/15/2010, SpO2 97 %.  LABORATORY DATA: Lab Results  Component Value Date   WBC 8.9 12/01/2018   HGB 12.7 12/01/2018   HCT 40.5 12/01/2018   MCV 89.6 12/01/2018   PLT 336 12/01/2018      Chemistry      Component Value Date/Time   NA 142 10/28/2018 0931   NA 135 (L) 01/08/2017 1055   K 4.4 10/28/2018 0931   K 3.2 (L) 01/08/2017 1055   CL 105 10/28/2018 0931   CO2 27 10/28/2018 0931   CO2 25 01/08/2017 1055   BUN 11 10/28/2018 0931   BUN 14.6 01/08/2017 1055   CREATININE 1.18 (H) 10/28/2018 0931   CREATININE 1.3 (H) 01/08/2017 1055      Component Value Date/Time   CALCIUM 9.3 10/28/2018 0931   CALCIUM 9.5 01/08/2017 1055   ALKPHOS 84 10/28/2018 0931   ALKPHOS 82 01/08/2017 1055   AST 11 (L) 10/28/2018 0931   AST 31 01/08/2017 1055   ALT 6  10/28/2018 0931   ALT 34 01/08/2017 1055   BILITOT 0.4 10/28/2018 0931   BILITOT 1.74 (H) 01/08/2017 1055       RADIOGRAPHIC STUDIES: Dg Chest 2 View  Result Date: 11/04/2018 CLINICAL DATA:  Follow-up pleural effusion. EXAM: CHEST - 2 VIEW COMPARISON:  Chest x-ray 10/26/2018 FINDINGS: The cardiac silhouette, mediastinal and hilar contours are stable. Left-sided PleurX drainage catheter in place with a small residual pleural effusion. There is persistent left lower lobe airspace opacity which could be atelectasis. Persistent perihilar predominant reticulonodular interstitial pattern in the lungs with peribronchial thickening. Findings could suggest an atypical pneumonia. No worrisome pulmonary lesions. No right-sided pleural effusion. IMPRESSION: 1. PleurX drainage catheter in place on the left side with a small residual pleural effusion. 2. Persistent reticulonodular interstitial process in the lungs and left lower lobe atelectasis or infiltrate. Electronically Signed    By: Marijo Sanes M.D.   On: 11/04/2018 14:52    ASSESSMENT AND PLAN: This is a very pleasant 53 years old African-American female with metastatic non-small cell lung cancer, adenocarcinoma and positive for EGFR mutation with deletion in exon 19.  The patient is currently on treatment with GILOTRIF 40 mg p.o. daily status post 13 months.  The patient is tolerating this treatment well except for few episodes of diarrhea and mild skin rash. She is complaining of dizzy spells and vertigo recently. Repeat CT scan of the chest, abdomen and pelvis performed recently showed some evidence for disease progression with increase in size and number of pulmonary nodules. Her treatment was switched to Tagrisso 80 mg p.o. daily started January 01, 2018.  She is status post 11 months of treatment The patient has been tolerating this treatment well with no concerning adverse effects. I recommended for her to continue her current treatment with Tagrisso with the same dose. For the recurrent left pleural effusion, the patient will continue with drainage of the pleural effusion very at the Pleurx catheter. She will come back for follow-up visit in 1 months for evaluation after repeating CT scan of the chest, abdomen pelvis as well as MRI of the brain for restaging of her disease. She was advised to call immediately if she has any concerning symptoms in the interval. The patient voices understanding of current disease status and treatment options and is in agreement with the current care plan. All questions were answered. The patient knows to call the clinic with any problems, questions or concerns. We can certainly see the patient much sooner if necessary.  Disclaimer: This note was dictated with voice recognition software. Similar sounding words can inadvertently be transcribed and may not be corrected upon review.

## 2018-12-02 ENCOUNTER — Encounter: Payer: Self-pay | Admitting: Family Medicine

## 2018-12-02 ENCOUNTER — Telehealth: Payer: Self-pay | Admitting: Internal Medicine

## 2018-12-02 MED FILL — FLUTICASONE PROP 50 MCG SPR: 50 | 30 days supply | Qty: 16 | Fill #0

## 2018-12-02 MED FILL — FUROSEMIDE 40 MG TAB: 40 | 30 days supply | Qty: 30 | Fill #0

## 2018-12-02 MED FILL — METHOCARBAMOL 500 MG TABLET: 500 | 10 days supply | Qty: 30 | Fill #0

## 2018-12-02 MED FILL — oxyCODONE HCL 10 MG TABS: 10 | 20 days supply | Qty: 60 | Fill #0

## 2018-12-02 NOTE — Telephone Encounter (Signed)
Scheduled per los. Called and left msg. Mailed printout  °

## 2018-12-20 ENCOUNTER — Ambulatory Visit (HOSPITAL_COMMUNITY)
Admission: RE | Admit: 2018-12-20 | Discharge: 2018-12-20 | Disposition: A | Discharge status: Home / Self Care | Payer: 59 | Source: Ambulatory Visit | Attending: Internal Medicine | Admitting: Internal Medicine

## 2018-12-20 ENCOUNTER — Other Ambulatory Visit: Payer: Self-pay

## 2018-12-20 DIAGNOSIS — C3490 Malignant neoplasm of unspecified part of unspecified bronchus or lung: Secondary | ICD-10-CM | POA: Diagnosis not present

## 2018-12-20 MED ORDER — GADOBUTROL 1 MMOL/ML IV SOLN
10.0000 mL | Freq: Once | INTRAVENOUS | Status: AC | PRN
  Administered 2018-12-20: 10 mL via INTRAVENOUS

## 2018-12-24 ENCOUNTER — Telehealth: Payer: Self-pay | Admitting: *Deleted

## 2018-12-24 NOTE — Telephone Encounter (Signed)
"  Nancy Moore, CoverMyMeds to confirm office insurance information for this patient.  Tagrisso 80 mg ordered.  Response received from [facility] is they are unable to find this patient.  Next steps for office is to call plan directly, [phone].

## 2018-12-28 ENCOUNTER — Other Ambulatory Visit: Payer: Self-pay | Admitting: Cardiothoracic Surgery

## 2018-12-28 DIAGNOSIS — C3492 Malignant neoplasm of unspecified part of left bronchus or lung: Secondary | ICD-10-CM

## 2018-12-29 ENCOUNTER — Ambulatory Visit (INDEPENDENT_AMBULATORY_CARE_PROVIDER_SITE_OTHER): Payer: 59 | Admitting: Cardiothoracic Surgery

## 2018-12-29 ENCOUNTER — Ambulatory Visit
Admission: RE | Admit: 2018-12-29 | Discharge: 2018-12-29 | Disposition: A | Discharge status: Home / Self Care | Payer: 59 | Source: Ambulatory Visit | Attending: Cardiothoracic Surgery | Admitting: Cardiothoracic Surgery

## 2018-12-29 ENCOUNTER — Encounter: Payer: Self-pay | Admitting: Cardiothoracic Surgery

## 2018-12-29 ENCOUNTER — Other Ambulatory Visit: Payer: Self-pay

## 2018-12-29 VITALS — BP 123/82 | HR 92 | Temp 97.7°F | Resp 20 | Ht 66.0 in | Wt 227.0 lb

## 2018-12-29 DIAGNOSIS — C3492 Malignant neoplasm of unspecified part of left bronchus or lung: Secondary | ICD-10-CM | POA: Diagnosis not present

## 2018-12-29 DIAGNOSIS — J910 Malignant pleural effusion: Secondary | ICD-10-CM

## 2018-12-29 NOTE — Progress Notes (Signed)
PCP is Copland, Gay Filler, MD Referring Provider is Maryanna Shape, NP  Chief Complaint  Patient presents with  . Pleural Effusion    1 month f/u with CXR     HPI: Patient returns for routine Pleurx catheter check.  She has a left Pleurx catheter for malignant left pleural effusion, adenocarcinoma lung.  After last visit drainage session was reduced to Monday Thursday schedule.  Her drainage is approximately 200 cc every session, clear fluid. No symptoms of shortness of breath or dry cough. She has had chronic generalized upper thoracic discomfort and tightness for which she takes an oxycodone at bedtime.  I reviewed the chest x-ray today which shows the Pleurx catheter in stable position, no evidence of significant pleural effusions on either side.   Past Medical History:  Diagnosis Date  . Adenocarcinoma of left lung, stage 4 ([place]) 10/28/2016  . Anemia   . Arthritis   . Constipation   . Crohn's disease ([place])    in remission 35 years +  . Dyspnea    gets "winded"  . GERD (gastroesophageal reflux disease)   . Goals of care, counseling/discussion 10/28/2016  . Hypertension   . Pneumonia    walking pneumonia in the late 80's  . Recurrent pleural effusion on left     Past Surgical History:  Procedure Laterality Date  . ABDOMINAL HYSTERECTOMY     partial hysterectomy  . BREAST EXCISIONAL BIOPSY Left 20+ yrs ago   benign  . CHEST TUBE INSERTION Left 10/26/2018   Procedure: INSERTION PLEURAL DRAINAGE CATHETER;  Surgeon: Ivin Poot, MD;  Location: [facility];  Service: Thoracic;  Laterality: Left;  . COLONOSCOPY    . PERICARDIAL WINDOW N/A 01/10/2017   Procedure: PERICARDIAL WINDOW- SUB XYPHOID, RIGHT CHEST TUBE;  Surgeon: Ivin Poot, MD;  Location: [facility];  Service: Thoracic;  Laterality: N/A;  . VIDEO BRONCHOSCOPY Bilateral 10/21/2016   Procedure: VIDEO BRONCHOSCOPY WITH FLUORO;  Surgeon: Tanda Rockers, MD;  Location: [facility];  Service: Cardiopulmonary;   Laterality: Bilateral;    Family History  Problem Relation Age of Onset  . Asthma Mother   . Stroke Mother   . Hypertension Mother   . Heart attack Father   . Hypertension Father   . Hyperlipidemia Father   . Dementia Father   . Emphysema Maternal Grandmother   . Hypertension Maternal Grandmother   . Colon cancer Paternal Grandmother   . Brain cancer Maternal Uncle     Social History Social History   Tobacco Use  . Smoking status: Never Smoker  . Smokeless tobacco: Never Used  Substance Use Topics  . Alcohol use: Yes    Comment: socially  . Drug use: No    Current Outpatient Medications  Medication Sig Dispense Refill  . clindamycin (CLINDAGEL) 1 % gel APPLY TO THE AFFECTED AREA(S) TWICE DAILY (Patient taking differently: Apply 1 application topically 2 (two) times daily. ) 30 g 1  . fluticasone (FLONASE) 50 MCG/ACT nasal spray PLACE 2 SPRAYS INTO BOTH NOSTRILS DAILY. 16 g 0  . furosemide (LASIX) 40 MG tablet TAKE 1 TABLET BY MOUTH ONCE DAILY 90 tablet 1  . loperamide (IMODIUM) 1 MG/5ML solution Take 2 mg as needed by mouth for diarrhea or loose stools.    . magnesium oxide (MAG-OX) 400 (241.3 Mg) MG tablet Take 1 tablet (400 mg total) by mouth daily.    . methocarbamol (ROBAXIN) 500 MG tablet TAKE 1 TABLET (500 MG TOTAL) BY MOUTH EVERY 8 (EIGHT)  HOURS AS NEEDED FOR MUSCLE SPASMS. 30 tablet 5  . nystatin cream (MYCOSTATIN) Apply 1 application topically 2 (two) times daily. (Patient taking differently: Apply 1 application topically daily as needed for dry skin. ) 30 g 0  . Oxycodone HCl 10 MG TABS TAKE 1 TABLET BY MOUTH THREE TIMES DAILY AS NEEDED 60 tablet 0  . pantoprazole (PROTONIX) 40 MG tablet TAKE 1 TABLET (40 MG TOTAL) BY MOUTH DAILY AT 12 NOON. (Patient taking differently: Take 40 mg by mouth daily. ) 90 tablet 3  . potassium chloride SA (K-DUR) 20 MEQ tablet TAKE 1 TABLET (20 MEQ TOTAL) BY MOUTH 2 TIMES DAILY. (Patient taking differently: Take 20 mEq by mouth 2 (two)  times daily. ) 180 tablet 3  . prochlorperazine (COMPAZINE) 10 MG tablet Take 1 tablet (10 mg total) every 6 (six) hours as needed by mouth for nausea or vomiting. 30 tablet 1  . senna-docusate (SENOKOT-S) 8.6-50 MG tablet Take 1 tablet by mouth at bedtime. (Patient taking differently: Take 1 tablet by mouth daily as needed for mild constipation. )    . TAGRISSO 80 MG tablet TAKE 1 TABLET BY MOUTH ONCE DAILY 30 tablet 0   No current facility-administered medications for this visit.     Allergies  Allergen Reactions  . Percocet [Oxycodone-Acetaminophen] Other (See Comments)    "makes me dizzy"  . Lisinopril Cough  . Losartan Cough    Review of Systems   Brain MRI performed last week showed no evidence of CNS metastatic disease Weight stable No fever No edema Stable mild fatigue  BP 123/82   Pulse 92   Temp 97.7 F (36.5 C) (Skin)   Resp 20   Ht 5' 6"  (1.676 m)   Wt 227 lb (103 kg)   LMP 03/15/2010   SpO2 95% Comment: RA  BMI 36.64 kg/m  Physical Exam     Physical Exam  General: Middle-aged well-appearing AA female no acute distress HEENT: Normocephalic pupils equal , dentition adequate Neck: Supple without JVD, adenopathy, or bruit Chest: Clear to auscultation, symmetrical breath sounds, no rhonchi, no tenderness             or deformity.  Pleurx catheter dressing dry and clean. Cardiovascular: Regular rate and rhythm, no murmur, no gallop, peripheral pulses             palpable in all extremities Abdomen:  Soft, nontender, no palpable mass or organomegaly Extremities: Warm, well-perfused, no clubbing cyanosis edema or tenderness,              no venous stasis changes of the legs Rectal/GU: Deferred Neuro: Grossly non--focal and symmetrical throughout Skin: Clean and dry without rash or ulceration   Diagnostic Tests: Chest x-ray personally reviewed as noted above  Impression: Pleurx drainage of Foley left pleural effusion Drainage volume is progressively  decreasing. At 200 cc drainage twice a week hopefully she is approaching a point where the catheter can be removed in the next couple months.  Plan: Patient will have a telemedicine call to assess drainage schedule in 1 month.   Len Childs, MD [facility] [phone]

## 2018-12-30 ENCOUNTER — Encounter: Payer: Self-pay | Admitting: Family Medicine

## 2018-12-30 ENCOUNTER — Other Ambulatory Visit: Payer: Self-pay | Admitting: Family Medicine

## 2018-12-30 DIAGNOSIS — L853 Xerosis cutis: Secondary | ICD-10-CM

## 2018-12-30 DIAGNOSIS — C3492 Malignant neoplasm of unspecified part of left bronchus or lung: Secondary | ICD-10-CM

## 2018-12-30 DIAGNOSIS — R52 Pain, unspecified: Secondary | ICD-10-CM

## 2018-12-30 MED FILL — oxyCODONE HCL 10 MG TABS: 10 | 20 days supply | Qty: 60 | Fill #0

## 2018-12-30 MED FILL — FUROSEMIDE 40 MG TAB: 40 | 30 days supply | Qty: 30 | Fill #1

## 2018-12-30 MED FILL — PANTOPRAZOLE SOD DR 40 MG T: 40 | 30 days supply | Qty: 30 | Fill #4

## 2018-12-30 MED FILL — POTASSIUM CHLORIDE CRYS ER: 20 MEQ | 30 days supply | Qty: 60 | Fill #4

## 2018-12-31 ENCOUNTER — Ambulatory Visit (HOSPITAL_COMMUNITY)
Admission: RE | Admit: 2018-12-31 | Discharge: 2018-12-31 | Disposition: A | Discharge status: Home / Self Care | Payer: 59 | Source: Ambulatory Visit | Attending: Internal Medicine | Admitting: Internal Medicine

## 2018-12-31 ENCOUNTER — Inpatient Hospital Stay: Payer: 59 | Attending: Oncology

## 2018-12-31 ENCOUNTER — Other Ambulatory Visit: Payer: Self-pay

## 2018-12-31 DIAGNOSIS — K5090 Crohn's disease, unspecified, without complications: Secondary | ICD-10-CM | POA: Diagnosis not present

## 2018-12-31 DIAGNOSIS — R21 Rash and other nonspecific skin eruption: Secondary | ICD-10-CM | POA: Diagnosis not present

## 2018-12-31 DIAGNOSIS — C3490 Malignant neoplasm of unspecified part of unspecified bronchus or lung: Secondary | ICD-10-CM | POA: Diagnosis not present

## 2018-12-31 DIAGNOSIS — C3492 Malignant neoplasm of unspecified part of left bronchus or lung: Secondary | ICD-10-CM | POA: Diagnosis not present

## 2018-12-31 DIAGNOSIS — J90 Pleural effusion, not elsewhere classified: Secondary | ICD-10-CM | POA: Insufficient documentation

## 2018-12-31 LAB — CMP (CANCER CENTER ONLY)
ALT: 9 U/L (ref 0–44)
AST: 14 U/L — ABNORMAL LOW (ref 15–41)
Albumin: 3.4 g/dL — ABNORMAL LOW (ref 3.5–5.0)
Alkaline Phosphatase: 90 U/L (ref 38–126)
Anion gap: 10 (ref 5–15)
BUN: 10 mg/dL (ref 6–20)
CO2: 29 mmol/L (ref 22–32)
Calcium: 9.7 mg/dL (ref 8.9–10.3)
Chloride: 101 mmol/L (ref 98–111)
Creatinine: 1.2 mg/dL — ABNORMAL HIGH (ref 0.44–1.00)
GFR, Est AFR Am: >60 mL/min (ref >60)
GFR, Estimated: 52 mL/min — ABNORMAL LOW (ref >60)
Glucose, Bld: 95 mg/dL (ref 70–99)
Potassium: 4 mmol/L (ref 3.5–5.1)
Sodium: 140 mmol/L (ref 135–145)
Total Bilirubin: 0.4 mg/dL (ref 0.3–1.2)
Total Protein: 7.4 g/dL (ref 6.5–8.1)

## 2018-12-31 LAB — CBC WITH DIFFERENTIAL (CANCER CENTER ONLY)
Abs Immature Granulocytes: 0.02 10*3/uL (ref 0.00–0.07)
Basophils Absolute: 0 10*3/uL (ref 0.0–0.1)
Basophils Relative: 0 %
Eosinophils Absolute: 0.1 10*3/uL (ref 0.0–0.5)
Eosinophils Relative: 2 %
HCT: 40.7 % (ref 36.0–46.0)
Hemoglobin: 12.6 g/dL (ref 12.0–15.0)
Immature Granulocytes: 0 %
Lymphocytes Relative: 16 %
Lymphs Abs: 1.4 10*3/uL (ref 0.7–4.0)
MCH: 27.3 pg (ref 26.0–34.0)
MCHC: 31 g/dL (ref 30.0–36.0)
MCV: 88.1 fL (ref 80.0–100.0)
Monocytes Absolute: 0.8 10*3/uL (ref 0.1–1.0)
Monocytes Relative: 9 %
Neutro Abs: 6.3 10*3/uL (ref 1.7–7.7)
Neutrophils Relative %: 73 %
Platelet Count: 292 10*3/uL (ref 150–400)
RBC: 4.62 MIL/uL (ref 3.87–5.11)
RDW: 16 % — ABNORMAL HIGH (ref 11.5–15.5)
WBC Count: 8.6 10*3/uL (ref 4.0–10.5)
nRBC: 0 % (ref 0.0–0.2)

## 2018-12-31 MED ORDER — IOHEXOL 300 MG/ML  SOLN
100.0000 mL | Freq: Once | INTRAMUSCULAR | Status: AC | PRN
  Administered 2018-12-31: 100 mL via INTRAVENOUS

## 2018-12-31 MED ORDER — SODIUM CHLORIDE (PF) 0.9 % IJ SOLN
INTRAMUSCULAR | Status: AC
  Filled 2018-12-31: qty 50

## 2019-01-03 ENCOUNTER — Inpatient Hospital Stay (HOSPITAL_BASED_OUTPATIENT_CLINIC_OR_DEPARTMENT_OTHER): Payer: 59 | Admitting: Internal Medicine

## 2019-01-03 ENCOUNTER — Encounter: Payer: Self-pay | Admitting: Internal Medicine

## 2019-01-03 ENCOUNTER — Other Ambulatory Visit: Payer: Self-pay

## 2019-01-03 VITALS — BP 110/66 | HR 89 | Temp 98.3°F | Resp 18 | Ht 66.0 in | Wt 224.1 lb

## 2019-01-03 DIAGNOSIS — Z5111 Encounter for antineoplastic chemotherapy: Secondary | ICD-10-CM | POA: Diagnosis not present

## 2019-01-03 DIAGNOSIS — C7931 Secondary malignant neoplasm of brain: Secondary | ICD-10-CM | POA: Diagnosis not present

## 2019-01-03 DIAGNOSIS — C3492 Malignant neoplasm of unspecified part of left bronchus or lung: Secondary | ICD-10-CM

## 2019-01-03 DIAGNOSIS — I10 Essential (primary) hypertension: Secondary | ICD-10-CM | POA: Diagnosis not present

## 2019-01-03 DIAGNOSIS — Z7189 Other specified counseling: Secondary | ICD-10-CM

## 2019-01-03 NOTE — Progress Notes (Signed)
[facility] [phone]   [phone]  OFFICE PROGRESS NOTE  Copland, Gay Filler, MD [address] [place] [address]  DIAGNOSIS: DIAGNOSIS: stage IV (T3, N2, M1 a) non-small cell lung cancer, poorly differentiated adenocarcinoma with extensive miliary distribution in the lungs bilaterally diagnosed in September 2018. 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 NEGATIVE for the Exon 20 T790M mutation. Repeat molecular studies by guardant 360 at time of progression showed ATM [id number]  PRIOR THERAPY: Gilotrif 40 mg daily started 11/08/2016.Status post 13 months of treatment.  CURRENT THERAPY: Tagrisso 80 mg p.o. daily.  First dose started January 01, 2018.  Status post 12 months of treatment.  INTERVAL HISTORY: Nancy Moore 53y.o. female returns to the clinic today for follow-up visit. The patient is feeling fine today with no concerning complaints except for intermittent pain on the left side of the chest at the lower rib cage with radiation to the front. She still have a Pleurx catheter on the left side with drainage of around 200 mL of pleural fluid twice a week. The patient denied having any shortness of breath except with exertion with no cough or hemoptysis. She denied having any fever or chills. She has no nausea, vomiting, diarrhea or constipation. She has no headache or visual changes. She has been tolerating her treatment with Tagrisso fairly well. She had repeat MRI of the brain as well as CT scan of the chest, abdomen pelvis performed recently and she is here for evaluation and discussion of her imaging studies and treatment options.  MEDICAL HISTORY: Past Medical History:  Diagnosis Date   [place] lung, stage 4 [place] 10/28/2016   Anemia    Arthritis    Constipation    Crohn's disease ([place]    in remission 35 years +   Dyspnea    gets "winded"   GERD (gastroesophageal reflux disease)    Goals  of care, counseling/discussion 10/28/2016   Hypertension    Pneumonia    walking pneumonia in the late 80's   Recurrent pleural effusion on left     ALLERGIES:  is allergic to percocet [oxycodone-acetaminophen]; lisinopril; and losartan.  MEDICATIONS:  Current Outpatient Medications  Medication Sig Dispense Refill   clindamycin (CLINDAGEL) 1 % gel Apply 1 application topically 2 (two) times daily. 30 g 1   fluticasone (FLONASE) 50 MCG/ACT nasal spray PLACE 2 SPRAYS INTO BOTH NOSTRILS DAILY. 16 g 0   furosemide (LASIX) 40 MG tablet TAKE 1 TABLET BY MOUTH ONCE DAILY 90 tablet 1   loperamide (IMODIUM) 1 MG/5ML solution Take 2 mg as needed by mouth for diarrhea or loose stools.     magnesium oxide (MAG-OX) 400 (241.3 Mg) MG tablet Take 1 tablet (400 mg total) by mouth daily.     methocarbamol (ROBAXIN) 500 MG tablet TAKE 1 TABLET (500 MG TOTAL) BY MOUTH EVERY 8 (EIGHT) HOURS AS NEEDED FOR MUSCLE SPASMS. 30 tablet 5   nystatin cream (MYCOSTATIN) Apply 1 application topically 2 (two) times daily. (Patient taking differently: Apply 1 application topically daily as needed for dry skin. ) 30 g 0   Oxycodone HCl 10 MG TABS TAKE 1 TABLET BY MOUTH 3 TIMES DAILY AS NEEDED 60 tablet 0   pantoprazole (PROTONIX) 40 MG tablet TAKE 1 TABLET (40 MG TOTAL) BY MOUTH DAILY AT 12 NOON. (Patient taking differently: Take 40 mg by mouth daily. ) 90 tablet 3   potassium chloride SA (  K-DUR) 20 MEQ tablet TAKE 1 TABLET (20 MEQ TOTAL) BY MOUTH 2 TIMES DAILY. (Patient taking differently: Take 20 mEq by mouth 2 (two) times daily. ) 180 tablet 3   prochlorperazine (COMPAZINE) 10 MG tablet Take 1 tablet (10 mg total) every 6 (six) hours as needed by mouth for nausea or vomiting. 30 tablet 1   senna-docusate (SENOKOT-S) 8.6-50 MG tablet Take 1 tablet by mouth at bedtime. (Patient taking differently: Take 1 tablet by mouth daily as needed for mild constipation. )     TAGRISSO 80 MG tablet TAKE 1 TABLET BY MOUTH  ONCE DAILY 30 tablet 0   No current facility-administered medications for this visit.     SURGICAL HISTORY:  Past Surgical History:  Procedure Laterality Date   ABDOMINAL HYSTERECTOMY     partial hysterectomy   BREAST EXCISIONAL BIOPSY Left 20+ yrs ago   benign   CHEST TUBE INSERTION Left 10/26/2018   Procedure: INSERTION PLEURAL DRAINAGE CATHETER;  Surgeon: Ivin Poot, MD;  Location: [facility];  Service: Thoracic;  Laterality: Left;   COLONOSCOPY     PERICARDIAL WINDOW N/A 01/10/2017   Procedure: PERICARDIAL WINDOW- SUB XYPHOID, RIGHT CHEST TUBE;  Surgeon: Ivin Poot, MD;  Location: [facility];  Service: Thoracic;  Laterality: N/A;   VIDEO BRONCHOSCOPY Bilateral 10/21/2016   Procedure: VIDEO BRONCHOSCOPY WITH FLUORO;  Surgeon: Tanda Rockers, MD;  Location: [facility];  Service: Cardiopulmonary;  Laterality: Bilateral;    REVIEW OF SYSTEMS:  Constitutional: positive for fatigue Eyes: negative Ears, nose, mouth, throat, and face: negative Respiratory: positive for dyspnea on exertion and pleurisy/chest pain Cardiovascular: negative Gastrointestinal: negative Genitourinary:negative Integument/breast: negative Hematologic/lymphatic: negative Musculoskeletal:negative Neurological: negative Behavioral/Psych: negative Endocrine: negative Allergic/Immunologic: negative   PHYSICAL EXAMINATION: General appearance: alert, cooperative, fatigued and no distress Head: Normocephalic, without obvious abnormality, atraumatic Neck: no adenopathy, no JVD, supple, symmetrical, trachea midline and thyroid not enlarged, symmetric, no tenderness/mass/nodules Lymph nodes: Cervical, supraclavicular, and axillary nodes normal. Resp: diminished breath sounds LLL and dullness to percussion LLL Back: symmetric, no curvature. ROM normal. No CVA tenderness. Cardio: regular rate and rhythm, S1, S2 normal, no murmur, click, rub or gallop GI: soft, non-tender; bowel sounds normal; no masses,  no  organomegaly Extremities: extremities normal, atraumatic, no cyanosis or edema Neurologic: Alert and oriented X 3, normal strength and tone. Normal symmetric reflexes. Normal coordination and gait  ECOG PERFORMANCE STATUS: 1 - Symptomatic but completely ambulatory  Blood pressure 110/66, pulse 89, temperature 98.3 F (36.8 C), temperature source Oral, resp. rate 18, height 5' 6"  (1.676 m), weight 224 lb 1.6 oz (101.7 kg), last menstrual period 03/15/2010, SpO2 96 %.  LABORATORY DATA: Lab Results  Component Value Date   WBC 8.6 12/31/2018   HGB 12.6 12/31/2018   HCT 40.7 12/31/2018   MCV 88.1 12/31/2018   PLT 292 12/31/2018      Chemistry      Component Value Date/Time   NA 140 12/31/2018 1023   NA 135 (L) 01/08/2017 1055   K 4.0 12/31/2018 1023   K 3.2 (L) 01/08/2017 1055   CL 101 12/31/2018 1023   CO2 29 12/31/2018 1023   CO2 25 01/08/2017 1055   BUN 10 12/31/2018 1023   BUN 14.6 01/08/2017 1055   CREATININE 1.20 (H) 12/31/2018 1023   CREATININE 1.3 (H) 01/08/2017 1055      Component Value Date/Time   CALCIUM 9.7 12/31/2018 1023   CALCIUM 9.5 01/08/2017 1055   ALKPHOS 90 12/31/2018 1023   ALKPHOS  82 01/08/2017 1055   AST 14 (L) 12/31/2018 1023   AST 31 01/08/2017 1055   ALT 9 12/31/2018 1023   ALT 34 01/08/2017 1055   BILITOT 0.4 12/31/2018 1023   BILITOT 1.74 (H) 01/08/2017 1055       RADIOGRAPHIC STUDIES: Dg Chest 2 View  Result Date: 12/29/2018 CLINICAL DATA:  Stage IV adenocarcinoma. No new complaints. Follow-up. EXAM: CHEST - 2 VIEW COMPARISON:  12/01/2018 FINDINGS: Left-sided chest tube remains in place. Midline trachea. Normal heart size. The left pleural effusion is small to moderate and minimally enlarged, especially compared on the lateral. There is trace right pleural fluid suspected. No pneumothorax. Left lower lung airspace disease is similar. Interstitial opacities are greater on the left than right and not significantly changed. IMPRESSION:  Minimally worsened left-sided aeration with slight increase in pleural fluid and similar lower lobe predominant airspace disease. Suspicion of trace right pleural fluid with persistent nonspecific interstitial opacities. Electronically Signed   By: Abigail Miyamoto M.D.   On: 12/29/2018 16:07   Ct Chest W Contrast  Result Date: 12/31/2018 CLINICAL DATA:  Stage IV lung adenocarcinoma with miliary lung metastases at diagnosis in September 2018, with ongoing oral chemotherapy. Restaging. EXAM: CT CHEST, ABDOMEN, AND PELVIS WITH CONTRAST TECHNIQUE: Multidetector CT imaging of the chest, abdomen and pelvis was performed following the standard protocol during bolus administration of intravenous contrast. CONTRAST:  17m OMNIPAQUE IOHEXOL 300 MG/ML  SOLN COMPARISON:  09/24/2018 CT chest, abdomen and pelvis. 10/13/2018 chest CT angiogram. 12/29/2018 chest radiograph. FINDINGS: CT CHEST FINDINGS Cardiovascular: Normal heart size. Small pericardial effusion/thickening, minimally increased. Minimally atherosclerotic nonaneurysmal thoracic aorta. Normal caliber pulmonary arteries. No central pulmonary emboli. Mediastinum/Nodes: No discrete thyroid nodules. Unremarkable esophagus. No axillary adenopathy. Enlarged heterogeneously enhancing 2.1 cm left paratracheal node (series 2/image 24), increased 1.5 cm on 09/24/2018. No additional pathologically enlarged mediastinal nodes. No pathologically enlarged hilar nodes. Lungs/Pleura: No pneumothorax. Left PleurX catheter terminates in posteromedial lower left pleural space. Small loculated left pleural effusion with extension into left major fissure, decreased from prior. Persistent posterior left pleural thickening and enhancement, increased. Trace dependent right pleural effusion with stable hyperdense right pleural plaques posteriorly. There is patchy confluent irregular nodular septal and peribronchovascular thickening throughout the left greater than right lungs, worsened in  the interval, compatible with lymphangitic carcinomatosis. Innumerable miliary and larger poorly marginated areas of nodularity scattered in both lungs have worsened. Representative irregular medial superior segment right lower lobe 2.5 x 1.5 cm nodule (series 4/image 47), previously 1.9 x 1.1 cm, increased. Representative peripheral right lower lobe 0.9 cm nodule (series 4/image 67), increased from 0.5 cm. Representative 1.3 cm posterior left lower lobe nodule (series 4/image 85), not previously discretely visualized although potentially obscured by atelectasis on the prior scan. Musculoskeletal:  No aggressive appearing focal osseous lesions. CT ABDOMEN PELVIS FINDINGS Hepatobiliary: Normal liver with no liver mass. Normal gallbladder with no radiopaque cholelithiasis. No biliary ductal dilatation. Pancreas: Normal, with no mass or duct dilation. Spleen: Normal size. No mass. Adrenals/Urinary Tract: Normal adrenals. No hydronephrosis. No renal masses. Normal bladder. Stomach/Bowel: Small hiatal hernia. Otherwise normal nondistended stomach. Normal caliber small bowel with no small bowel wall thickening. Appendix not discretely visualized. Oral contrast transits to the colon. Normal large bowel with no diverticulosis, large bowel wall thickening or pericolonic fat stranding. Vascular/Lymphatic: Atherosclerotic nonaneurysmal abdominal aorta. Patent portal, splenic, hepatic and renal veins. No pathologically enlarged lymph nodes in the abdomen or pelvis. Reproductive: Status post hysterectomy, with no abnormal findings at the  vaginal cuff. No adnexal mass. Other: No pneumoperitoneum, ascites or focal fluid collection. Small fat containing umbilical hernia, stable. Musculoskeletal: No aggressive appearing focal osseous lesions. IMPRESSION: 1. Progression of metastatic disease in the chest. Worsening findings of lymphangitic carcinomatosis throughout the left greater than right lungs as detailed. Enlarging miliary and  larger irregular pulmonary metastases. Enlarging left paratracheal nodal metastasis. 2. Small loculated left pleural effusion with left PleurX catheter in place, with pleural fluid extending into the left major fissure. Left pleural thickening and enhancement, increased. Trace dependent right pleural effusion. 3. Small pericardial effusion/thickening, minimally increased. 4. No evidence of metastatic disease in the abdomen or pelvis. Electronically Signed   By: Ilona Sorrel M.D.   On: 12/31/2018 13:19   Mr Jeri Cos QI Contrast  Result Date: 12/20/2018 CLINICAL DATA:  Lung cancer, non-small cell, staging EXAM: MRI HEAD WITHOUT AND WITH CONTRAST TECHNIQUE: Multiplanar, multiecho pulse sequences of the brain and surrounding structures were obtained without and with intravenous contrast. CONTRAST:  27m GADAVIST GADOBUTROL 1 MMOL/ML IV SOLN COMPARISON:  Prior brain MRI examinations 09/21/2018 and earlier. FINDINGS: Brain: There is no evidence of acute infarct. No evidence of intracranial mass. No midline shift or extra-axial fluid collection. No significant cerebral white matter disease. No enhancing lesions are identified to suggest active intracranial metastatic disease. Chronic hemosiderin deposition at site of previous metastases was better appreciated on susceptibility weighted imaging acquired on 06/09/2018. Cerebral volume is normal for age. Vascular: Flow voids maintained within the proximal large arterial vessels. Expected enhancement within the dural venous sinuses. Skull and upper cervical spine: No focal marrow lesion Sinuses/Orbits: Visualized orbits demonstrate no acute abnormality. Mild scattered paranasal sinus mucosal thickening. Small right maxillary sinus mucous retention cysts. No significant mastoid effusion. IMPRESSION: No enhancing lesions to suggest active intracranial metastatic disease. No evidence of acute intracranial abnormality. Electronically Signed   By: KKellie SimmeringDO   On:  12/20/2018 15:07   Ct Abdomen Pelvis W Contrast  Result Date: 12/31/2018 CLINICAL DATA:  Stage IV lung adenocarcinoma with miliary lung metastases at diagnosis in September 2018, with ongoing oral chemotherapy. Restaging. EXAM: CT CHEST, ABDOMEN, AND PELVIS WITH CONTRAST TECHNIQUE: Multidetector CT imaging of the chest, abdomen and pelvis was performed following the standard protocol during bolus administration of intravenous contrast. CONTRAST:  1031mOMNIPAQUE IOHEXOL 300 MG/ML  SOLN COMPARISON:  09/24/2018 CT chest, abdomen and pelvis. 10/13/2018 chest CT angiogram. 12/29/2018 chest radiograph. FINDINGS: CT CHEST FINDINGS Cardiovascular: Normal heart size. Small pericardial effusion/thickening, minimally increased. Minimally atherosclerotic nonaneurysmal thoracic aorta. Normal caliber pulmonary arteries. No central pulmonary emboli. Mediastinum/Nodes: No discrete thyroid nodules. Unremarkable esophagus. No axillary adenopathy. Enlarged heterogeneously enhancing 2.1 cm left paratracheal node (series 2/image 24), increased 1.5 cm on 09/24/2018. No additional pathologically enlarged mediastinal nodes. No pathologically enlarged hilar nodes. Lungs/Pleura: No pneumothorax. Left PleurX catheter terminates in posteromedial lower left pleural space. Small loculated left pleural effusion with extension into left major fissure, decreased from prior. Persistent posterior left pleural thickening and enhancement, increased. Trace dependent right pleural effusion with stable hyperdense right pleural plaques posteriorly. There is patchy confluent irregular nodular septal and peribronchovascular thickening throughout the left greater than right lungs, worsened in the interval, compatible with lymphangitic carcinomatosis. Innumerable miliary and larger poorly marginated areas of nodularity scattered in both lungs have worsened. Representative irregular medial superior segment right lower lobe 2.5 x 1.5 cm nodule (series  4/image 47), previously 1.9 x 1.1 cm, increased. Representative peripheral right lower lobe 0.9 cm nodule (series 4/image 67),  increased from 0.5 cm. Representative 1.3 cm posterior left lower lobe nodule (series 4/image 85), not previously discretely visualized although potentially obscured by atelectasis on the prior scan. Musculoskeletal:  No aggressive appearing focal osseous lesions. CT ABDOMEN PELVIS FINDINGS Hepatobiliary: Normal liver with no liver mass. Normal gallbladder with no radiopaque cholelithiasis. No biliary ductal dilatation. Pancreas: Normal, with no mass or duct dilation. Spleen: Normal size. No mass. Adrenals/Urinary Tract: Normal adrenals. No hydronephrosis. No renal masses. Normal bladder. Stomach/Bowel: Small hiatal hernia. Otherwise normal nondistended stomach. Normal caliber small bowel with no small bowel wall thickening. Appendix not discretely visualized. Oral contrast transits to the colon. Normal large bowel with no diverticulosis, large bowel wall thickening or pericolonic fat stranding. Vascular/Lymphatic: Atherosclerotic nonaneurysmal abdominal aorta. Patent portal, splenic, hepatic and renal veins. No pathologically enlarged lymph nodes in the abdomen or pelvis. Reproductive: Status post hysterectomy, with no abnormal findings at the vaginal cuff. No adnexal mass. Other: No pneumoperitoneum, ascites or focal fluid collection. Small fat containing umbilical hernia, stable. Musculoskeletal: No aggressive appearing focal osseous lesions. IMPRESSION: 1. Progression of metastatic disease in the chest. Worsening findings of lymphangitic carcinomatosis throughout the left greater than right lungs as detailed. Enlarging miliary and larger irregular pulmonary metastases. Enlarging left paratracheal nodal metastasis. 2. Small loculated left pleural effusion with left PleurX catheter in place, with pleural fluid extending into the left major fissure. Left pleural thickening and enhancement,  increased. Trace dependent right pleural effusion. 3. Small pericardial effusion/thickening, minimally increased. 4. No evidence of metastatic disease in the abdomen or pelvis. Electronically Signed   By: Ilona Sorrel M.D.   On: 12/31/2018 13:19    ASSESSMENT AND PLAN: This is a very pleasant 53 years old African-American female with metastatic non-small cell lung cancer, adenocarcinoma and positive for EGFR mutation with deletion in exon 19.  The patient is currently on treatment with GILOTRIF 40 mg p.o. daily status post 13 months.  The patient is tolerating this treatment well except for few episodes of diarrhea and mild skin rash. She is complaining of dizzy spells and vertigo recently. Repeat CT scan of the chest, abdomen and pelvis performed recently showed some evidence for disease progression with increase in size and number of pulmonary nodules. Her treatment was switched to Tagrisso 80 mg p.o. daily started January 01, 2018.  She is status post 12 months of treatment The patient has been tolerating this treatment well with no concerning adverse effects. She had repeat MRI of the brain as well as CT scan of the chest, abdomen pelvis performed recently. Her MRI of the brain showed no concerning findings for disease progression in the brain but CT scan of the chest showed some evidence for progression with lymphangitic spread of the disease. I had a lengthy discussion with the patient today about her current condition and treatment options. I recommended for the patient to continue her current treatment with Tagrisso for now. I will refer her to Dr. Durenda Hurt at [facility] for second opinion. Some of the option for this patient in the future would be consideration of adding systemic chemotherapy or VEGF inhibitor likely Avastin or ramucirumab to her current treatment with Tagrisso. I will wait to hear from Dr. Durenda Hurt. I will see the patient back for follow-up visit in 1  months with repeat blood work. For the recurrent left pleural effusion, the patient will continue with drainage of the fluid on as-needed basis. The patient was advised to call immediately if she has any concerning  symptoms in the interval. The patient voices understanding of current disease status and treatment options and is in agreement with the current care plan. All questions were answered. The patient knows to call the clinic with any problems, questions or concerns. We can certainly see the patient much sooner if necessary.  Disclaimer: This note was dictated with voice recognition software. Similar sounding words can inadvertently be transcribed and may not be corrected upon review.

## 2019-01-04 ENCOUNTER — Telehealth: Payer: Self-pay | Admitting: Internal Medicine

## 2019-01-04 NOTE — Telephone Encounter (Signed)
Scheduled appt per 12/7 los.  Spoke with pt and she is aware of the appt date and time.

## 2019-01-05 ENCOUNTER — Telehealth: Payer: Self-pay | Admitting: Internal Medicine

## 2019-01-05 ENCOUNTER — Telehealth: Payer: Self-pay

## 2019-01-05 NOTE — Telephone Encounter (Signed)
Ms Advincula left vm stating she was unable to get her Tagrisso rx filled and that we needed to call. I spoke with Freda Munro from [facility].  At [phone].  She stated that a preauthorization was needed from her insurance carrier.  I sent a staff message to Wynn [place].

## 2019-01-05 NOTE — Telephone Encounter (Signed)
FAXED RECORDS TO DR [facility] AT [phone]

## 2019-01-06 ENCOUNTER — Telehealth: Payer: Self-pay

## 2019-01-06 NOTE — Telephone Encounter (Signed)
Oral Oncology Patient Advocate Encounter  Prior Authorization for [facility] has been approved.    PA# [id number] Effective dates: 01/06/19 through 01/06/20  Information will be shared with [facility] will continue to follow.   [facility] Patient [place] Phone [phone] Fax [phone] 01/06/2019 10:44 AM

## 2019-01-06 NOTE — Telephone Encounter (Signed)
Oral Oncology Patient Advocate Encounter  Received notification from [facility] that prior authorization for Tagrisso is required.  PA submitted on CoverMyMeds Key [id number] Status is pending  Oral Oncology Clinic will continue to follow.  [facility] Patient [place] Phone [phone]  Fax [phone]  01/06/2019 10:28 AM

## 2019-01-07 ENCOUNTER — Telehealth: Payer: Self-pay | Admitting: *Deleted

## 2019-01-07 NOTE — Telephone Encounter (Signed)
Oncology Nurse Navigator Documentation  Oncology Nurse Navigator Flowsheets 01/07/2019  Abnormal Finding Date -  Confirmed Diagnosis Date -  Navigator Location [facility]  Referral Date to RadOnc/MedOnc -  Navigator Encounter Type Telephone/I received a call from Nancy Moore. She was concerned about her auth for Tigrisso medication.  I called oral pharmacist and was updated medication has been [place].  I called patient back to update that this has been completed and gave her the phone number for [facility] to call.  I also left message to that if she needed to call me with my name and phone number to call.   Telephone Outgoing Call  Patient Visit Type -  Treatment Phase Treatment  Barriers/Navigation Needs Coordination of Care;Education  Education Other  Interventions Coordination of Care;Education  Acuity Level 2-Minimal Needs (1-2 Barriers Identified)  Referrals -  Coordination of Care Other  Education Method Verbal  Time Spent with Patient 30

## 2019-01-11 ENCOUNTER — Encounter: Payer: Self-pay | Admitting: Internal Medicine

## 2019-01-12 ENCOUNTER — Telehealth: Payer: Self-pay | Admitting: Internal Medicine

## 2019-01-12 NOTE — Telephone Encounter (Signed)
Scheduled appt per 12/15 sch message - pt is aware of appt date and time

## 2019-01-13 ENCOUNTER — Encounter: Payer: Self-pay | Admitting: Internal Medicine

## 2019-01-13 ENCOUNTER — Telehealth (HOSPITAL_BASED_OUTPATIENT_CLINIC_OR_DEPARTMENT_OTHER): Payer: 59 | Admitting: Internal Medicine

## 2019-01-13 DIAGNOSIS — I10 Essential (primary) hypertension: Secondary | ICD-10-CM | POA: Diagnosis not present

## 2019-01-13 DIAGNOSIS — Z5111 Encounter for antineoplastic chemotherapy: Secondary | ICD-10-CM | POA: Diagnosis not present

## 2019-01-13 DIAGNOSIS — C3492 Malignant neoplasm of unspecified part of left bronchus or lung: Secondary | ICD-10-CM | POA: Diagnosis not present

## 2019-01-13 DIAGNOSIS — C7931 Secondary malignant neoplasm of brain: Secondary | ICD-10-CM | POA: Diagnosis not present

## 2019-01-13 MED ORDER — HYDROCODONE-HOMATROPINE 5-1.5 MG/5ML PO SYRP: 5.0000 mL | ORAL_SOLUTION | Freq: Four times a day (QID) | ORAL | 0 refills | Status: DC | PRN

## 2019-01-13 MED FILL — HYDROCODONE-HOMATROPINE SYR: 5-1.5 | 6 days supply | Qty: 120 | Fill #0

## 2019-01-13 NOTE — Progress Notes (Signed)
[place] [phone]   [phone]  PROGRESS NOTE FOR TELEMEDICINE VISITS  Copland, Gay Filler, MD [address] [address]  I connected with@ on 01/13/19 at 11:00 AM EST by video enabled telemedicine visit and verified that I am speaking with the correct person using two identifiers.   I discussed the limitations, risks, security and privacy concerns of performing an evaluation and management service by telemedicine and the availability of in-person appointments. I also discussed with the patient that there may be a patient responsible charge related to this service. The patient expressed understanding and agreed to proceed.  Other persons participating in the visit and their role in the encounter:  None  Patient's location:  Home Provider's location: [place].  DIAGNOSIS: DIAGNOSIS:stage IV (T3, N2, M1 a) non-small cell lung cancer, poorly differentiated adenocarcinoma with extensive miliary distribution in the lungs bilaterally diagnosed in September 2018. 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 NEGATIVE for the Exon 20 T790M mutation. Repeat molecular studies by guardant 360 at time of progression showed ATM [id number]  PRIOR THERAPY:Gilotrif 40 mg daily started 11/08/2016.Status post159monthof treatment.  CURRENT THERAPY:Tagrisso 80 mg p.o. daily.  First dose started January 01, 2018.  Status post 12 months of treatment.  INTERVAL HISTORY: Nancy Moore [age].o. female has a MyChart virtual visit with me today for evaluation and discussion of her treatment options after [facility] The patient is feeling fine today with no concerning complaints except for dry cough and shortness of breath with exertion.  She denied having any current chest pain or hemoptysis.  She has no recent weight loss or night sweats.  She has no nausea, vomiting, diarrhea  or constipation.  She has no headache or visual changes.  She continues to tolerate her treatment with Tagrisso fairly well.  MEDICAL HISTORY: Past Medical History:  Diagnosis Date   Adenocarcinoma of left lung, stage 4 ([facility]/02/2016   Anemia    Arthritis    Constipation    Crohn's disease ([facility]   in remission 35 years +   Dyspnea    gets "winded"   GERD (gastroesophageal reflux disease)    Goals of care, counseling/discussion 10/28/2016   Hypertension    Pneumonia    walking pneumonia in the late 80's   Recurrent pleural effusion on left     ALLERGIES:  is allergic to percocet [oxycodone-acetaminophen]; lisinopril; and losartan.  MEDICATIONS:  Current Outpatient Medications  Medication Sig Dispense Refill   clindamycin (CLINDAGEL) 1 % gel Apply 1 application topically 2 (two) times daily. 30 g 1   fluticasone (FLONASE) 50 MCG/ACT nasal spray PLACE 2 SPRAYS INTO BOTH NOSTRILS DAILY. 16 g 0   furosemide (LASIX) 40 MG tablet TAKE 1 TABLET BY MOUTH ONCE DAILY 90 tablet 1   loperamide (IMODIUM) 1 MG/5ML solution Take 2 mg as needed by mouth for diarrhea or loose stools.     magnesium oxide (MAG-OX) 400 (241.3 Mg) MG tablet Take 1 tablet (400 mg total) by mouth daily.     methocarbamol (ROBAXIN) 500 MG tablet TAKE 1 TABLET (500 MG TOTAL) BY MOUTH EVERY 8 (EIGHT) HOURS AS NEEDED FOR MUSCLE SPASMS. 30 tablet 5   nystatin cream (MYCOSTATIN) Apply 1 application topically 2 (two) times daily. (Patient taking differently: Apply 1 application topically daily as needed for dry skin. ) 30 g 0   Oxycodone HCl 10  MG TABS TAKE 1 TABLET BY MOUTH 3 TIMES DAILY AS NEEDED 60 tablet 0   pantoprazole (PROTONIX) 40 MG tablet TAKE 1 TABLET (40 MG TOTAL) BY MOUTH DAILY AT 12 NOON. (Patient taking differently: Take 40 mg by mouth daily. ) 90 tablet 3   potassium chloride SA (K-DUR) 20 MEQ tablet TAKE 1 TABLET (20 MEQ TOTAL) BY MOUTH 2 TIMES DAILY. (Patient taking differently: Take 20  mEq by mouth 2 (two) times daily. ) 180 tablet 3   prochlorperazine (COMPAZINE) 10 MG tablet Take 1 tablet (10 mg total) every 6 (six) hours as needed by mouth for nausea or vomiting. 30 tablet 1   senna-docusate (SENOKOT-S) 8.6-50 MG tablet Take 1 tablet by mouth at bedtime. (Patient taking differently: Take 1 tablet by mouth daily as needed for mild constipation. )     TAGRISSO 80 MG tablet TAKE 1 TABLET BY MOUTH ONCE DAILY 30 tablet 0   No current facility-administered medications for this visit.    SURGICAL HISTORY:  Past Surgical History:  Procedure Laterality Date   ABDOMINAL HYSTERECTOMY     partial hysterectomy   BREAST EXCISIONAL BIOPSY Left 20+ yrs ago   benign   CHEST TUBE INSERTION Left 10/26/2018   Procedure: INSERTION PLEURAL DRAINAGE CATHETER;  Surgeon: Ivin Poot, MD;  Location: [place];  Service: Thoracic;  Laterality: Left;   COLONOSCOPY     PERICARDIAL WINDOW N/A 01/10/2017   Procedure: PERICARDIAL WINDOW- SUB XYPHOID, RIGHT CHEST TUBE;  Surgeon: Ivin Poot, MD;  Location: [place];  Service: Thoracic;  Laterality: N/A;   VIDEO BRONCHOSCOPY Bilateral 10/21/2016   Procedure: VIDEO BRONCHOSCOPY WITH FLUORO;  Surgeon: Tanda Rockers, MD;  Location: [facility];  Service: Cardiopulmonary;  Laterality: Bilateral;    REVIEW OF SYSTEMS:  Constitutional: positive for fatigue Eyes: negative Ears, nose, mouth, throat, and face: negative Respiratory: positive for cough and dyspnea on exertion Cardiovascular: negative Gastrointestinal: negative Genitourinary:negative Integument/breast: negative Hematologic/lymphatic: negative Musculoskeletal:negative Neurological: negative Behavioral/Psych: negative Endocrine: negative Allergic/Immunologic: negative   LABORATORY DATA: Lab Results  Component Value Date   WBC 8.6 12/31/2018   HGB 12.6 12/31/2018   HCT 40.7 12/31/2018   MCV 88.1 12/31/2018   PLT 292 12/31/2018      Chemistry      Component Value  Date/Time   NA 140 12/31/2018 1023   NA 135 (L) 01/08/2017 1055   K 4.0 12/31/2018 1023   K 3.2 (L) 01/08/2017 1055   CL 101 12/31/2018 1023   CO2 29 12/31/2018 1023   CO2 25 01/08/2017 1055   BUN 10 12/31/2018 1023   BUN 14.6 01/08/2017 1055   CREATININE 1.20 (H) 12/31/2018 1023   CREATININE 1.3 (H) 01/08/2017 1055      Component Value Date/Time   CALCIUM 9.7 12/31/2018 1023   CALCIUM 9.5 01/08/2017 1055   ALKPHOS 90 12/31/2018 1023   ALKPHOS 82 01/08/2017 1055   AST 14 (L) 12/31/2018 1023   AST 31 01/08/2017 1055   ALT 9 12/31/2018 1023   ALT 34 01/08/2017 1055   BILITOT 0.4 12/31/2018 1023   BILITOT 1.74 (H) 01/08/2017 1055       RADIOGRAPHIC STUDIES: DG Chest 2 View  Result Date: 12/29/2018 CLINICAL DATA:  Stage IV adenocarcinoma. No new complaints. Follow-up. EXAM: CHEST - 2 VIEW COMPARISON:  12/01/2018 FINDINGS: Left-sided chest tube remains in place. Midline trachea. Normal heart size. The left pleural effusion is small to moderate and minimally enlarged, especially compared on the lateral. There is trace  right pleural fluid suspected. No pneumothorax. Left lower lung airspace disease is similar. Interstitial opacities are greater on the left than right and not significantly changed. IMPRESSION: Minimally worsened left-sided aeration with slight increase in pleural fluid and similar lower lobe predominant airspace disease. Suspicion of trace right pleural fluid with persistent nonspecific interstitial opacities. Electronically Signed   By: Abigail Miyamoto M.D.   On: 12/29/2018 16:07   CT Chest W Contrast  Result Date: 12/31/2018 CLINICAL DATA:  Stage IV lung adenocarcinoma with miliary lung metastases at diagnosis in September 2018, with ongoing oral chemotherapy. Restaging. EXAM: CT CHEST, ABDOMEN, AND PELVIS WITH CONTRAST TECHNIQUE: Multidetector CT imaging of the chest, abdomen and pelvis was performed following the standard protocol during bolus administration of  intravenous contrast. CONTRAST:  159m OMNIPAQUE IOHEXOL 300 MG/ML  SOLN COMPARISON:  09/24/2018 CT chest, abdomen and pelvis. 10/13/2018 chest CT angiogram. 12/29/2018 chest radiograph. FINDINGS: CT CHEST FINDINGS Cardiovascular: Normal heart size. Small pericardial effusion/thickening, minimally increased. Minimally atherosclerotic nonaneurysmal thoracic aorta. Normal caliber pulmonary arteries. No central pulmonary emboli. Mediastinum/Nodes: No discrete thyroid nodules. Unremarkable esophagus. No axillary adenopathy. Enlarged heterogeneously enhancing 2.1 cm left paratracheal node (series 2/image 24), increased 1.5 cm on 09/24/2018. No additional pathologically enlarged mediastinal nodes. No pathologically enlarged hilar nodes. Lungs/Pleura: No pneumothorax. Left PleurX catheter terminates in posteromedial lower left pleural space. Small loculated left pleural effusion with extension into left major fissure, decreased from prior. Persistent posterior left pleural thickening and enhancement, increased. Trace dependent right pleural effusion with stable hyperdense right pleural plaques posteriorly. There is patchy confluent irregular nodular septal and peribronchovascular thickening throughout the left greater than right lungs, worsened in the interval, compatible with lymphangitic carcinomatosis. Innumerable miliary and larger poorly marginated areas of nodularity scattered in both lungs have worsened. Representative irregular medial superior segment right lower lobe 2.5 x 1.5 cm nodule (series 4/image 47), previously 1.9 x 1.1 cm, increased. Representative peripheral right lower lobe 0.9 cm nodule (series 4/image 67), increased from 0.5 cm. Representative 1.3 cm posterior left lower lobe nodule (series 4/image 85), not previously discretely visualized although potentially obscured by atelectasis on the prior scan. Musculoskeletal:  No aggressive appearing focal osseous lesions. CT ABDOMEN PELVIS FINDINGS  Hepatobiliary: Normal liver with no liver mass. Normal gallbladder with no radiopaque cholelithiasis. No biliary ductal dilatation. Pancreas: Normal, with no mass or duct dilation. Spleen: Normal size. No mass. Adrenals/Urinary Tract: Normal adrenals. No hydronephrosis. No renal masses. Normal bladder. Stomach/Bowel: Small hiatal hernia. Otherwise normal nondistended stomach. Normal caliber small bowel with no small bowel wall thickening. Appendix not discretely visualized. Oral contrast transits to the colon. Normal large bowel with no diverticulosis, large bowel wall thickening or pericolonic fat stranding. Vascular/Lymphatic: Atherosclerotic nonaneurysmal abdominal aorta. Patent portal, splenic, hepatic and renal veins. No pathologically enlarged lymph nodes in the abdomen or pelvis. Reproductive: Status post hysterectomy, with no abnormal findings at the vaginal cuff. No adnexal mass. Other: No pneumoperitoneum, ascites or focal fluid collection. Small fat containing umbilical hernia, stable. Musculoskeletal: No aggressive appearing focal osseous lesions. IMPRESSION: 1. Progression of metastatic disease in the chest. Worsening findings of lymphangitic carcinomatosis throughout the left greater than right lungs as detailed. Enlarging miliary and larger irregular pulmonary metastases. Enlarging left paratracheal nodal metastasis. 2. Small loculated left pleural effusion with left PleurX catheter in place, with pleural fluid extending into the left major fissure. Left pleural thickening and enhancement, increased. Trace dependent right pleural effusion. 3. Small pericardial effusion/thickening, minimally increased. 4. No evidence of metastatic disease in  the abdomen or pelvis. Electronically Signed   By: Ilona Sorrel M.D.   On: 12/31/2018 13:19   MR Brain W Wo Contrast  Result Date: 12/20/2018 CLINICAL DATA:  Lung cancer, non-small cell, staging EXAM: MRI HEAD WITHOUT AND WITH CONTRAST TECHNIQUE: Multiplanar,  multiecho pulse sequences of the brain and surrounding structures were obtained without and with intravenous contrast. CONTRAST:  49m GADAVIST GADOBUTROL 1 MMOL/ML IV SOLN COMPARISON:  Prior brain MRI examinations 09/21/2018 and earlier. FINDINGS: Brain: There is no evidence of acute infarct. No evidence of intracranial mass. No midline shift or extra-axial fluid collection. No significant cerebral white matter disease. No enhancing lesions are identified to suggest active intracranial metastatic disease. Chronic hemosiderin deposition at site of previous metastases was better appreciated on susceptibility weighted imaging acquired on 06/09/2018. Cerebral volume is normal for age. Vascular: Flow voids maintained within the proximal large arterial vessels. Expected enhancement within the dural venous sinuses. Skull and upper cervical spine: No focal marrow lesion Sinuses/Orbits: Visualized orbits demonstrate no acute abnormality. Mild scattered paranasal sinus mucosal thickening. Small right maxillary sinus mucous retention cysts. No significant mastoid effusion. IMPRESSION: No enhancing lesions to suggest active intracranial metastatic disease. No evidence of acute intracranial abnormality. Electronically Signed   By: KKellie SimmeringDO   On: 12/20/2018 15:07   CT Abdomen Pelvis W Contrast  Result Date: 12/31/2018 CLINICAL DATA:  Stage IV lung adenocarcinoma with miliary lung metastases at diagnosis in September 2018, with ongoing oral chemotherapy. Restaging. EXAM: CT CHEST, ABDOMEN, AND PELVIS WITH CONTRAST TECHNIQUE: Multidetector CT imaging of the chest, abdomen and pelvis was performed following the standard protocol during bolus administration of intravenous contrast. CONTRAST:  1025mOMNIPAQUE IOHEXOL 300 MG/ML  SOLN COMPARISON:  09/24/2018 CT chest, abdomen and pelvis. 10/13/2018 chest CT angiogram. 12/29/2018 chest radiograph. FINDINGS: CT CHEST FINDINGS Cardiovascular: Normal heart size. Small  pericardial effusion/thickening, minimally increased. Minimally atherosclerotic nonaneurysmal thoracic aorta. Normal caliber pulmonary arteries. No central pulmonary emboli. Mediastinum/Nodes: No discrete thyroid nodules. Unremarkable esophagus. No axillary adenopathy. Enlarged heterogeneously enhancing 2.1 cm left paratracheal node (series 2/image 24), increased 1.5 cm on 09/24/2018. No additional pathologically enlarged mediastinal nodes. No pathologically enlarged hilar nodes. Lungs/Pleura: No pneumothorax. Left PleurX catheter terminates in posteromedial lower left pleural space. Small loculated left pleural effusion with extension into left major fissure, decreased from prior. Persistent posterior left pleural thickening and enhancement, increased. Trace dependent right pleural effusion with stable hyperdense right pleural plaques posteriorly. There is patchy confluent irregular nodular septal and peribronchovascular thickening throughout the left greater than right lungs, worsened in the interval, compatible with lymphangitic carcinomatosis. Innumerable miliary and larger poorly marginated areas of nodularity scattered in both lungs have worsened. Representative irregular medial superior segment right lower lobe 2.5 x 1.5 cm nodule (series 4/image 47), previously 1.9 x 1.1 cm, increased. Representative peripheral right lower lobe 0.9 cm nodule (series 4/image 67), increased from 0.5 cm. Representative 1.3 cm posterior left lower lobe nodule (series 4/image 85), not previously discretely visualized although potentially obscured by atelectasis on the prior scan. Musculoskeletal:  No aggressive appearing focal osseous lesions. CT ABDOMEN PELVIS FINDINGS Hepatobiliary: Normal liver with no liver mass. Normal gallbladder with no radiopaque cholelithiasis. No biliary ductal dilatation. Pancreas: Normal, with no mass or duct dilation. Spleen: Normal size. No mass. Adrenals/Urinary Tract: Normal adrenals. No  hydronephrosis. No renal masses. Normal bladder. Stomach/Bowel: Small hiatal hernia. Otherwise normal nondistended stomach. Normal caliber small bowel with no small bowel wall thickening. Appendix not discretely visualized. Oral contrast transits  to the colon. Normal large bowel with no diverticulosis, large bowel wall thickening or pericolonic fat stranding. Vascular/Lymphatic: Atherosclerotic nonaneurysmal abdominal aorta. Patent portal, splenic, hepatic and renal veins. No pathologically enlarged lymph nodes in the abdomen or pelvis. Reproductive: Status post hysterectomy, with no abnormal findings at the vaginal cuff. No adnexal mass. Other: No pneumoperitoneum, ascites or focal fluid collection. Small fat containing umbilical hernia, stable. Musculoskeletal: No aggressive appearing focal osseous lesions. IMPRESSION: 1. Progression of metastatic disease in the chest. Worsening findings of lymphangitic carcinomatosis throughout the left greater than right lungs as detailed. Enlarging miliary and larger irregular pulmonary metastases. Enlarging left paratracheal nodal metastasis. 2. Small loculated left pleural effusion with left PleurX catheter in place, with pleural fluid extending into the left major fissure. Left pleural thickening and enhancement, increased. Trace dependent right pleural effusion. 3. Small pericardial effusion/thickening, minimally increased. 4. No evidence of metastatic disease in the abdomen or pelvis. Electronically Signed   By: Ilona Sorrel M.D.   On: 12/31/2018 13:19    ASSESSMENT AND PLAN: This is a very pleasant 53 years old African-American female with a stage IV non-small cell lung cancer, poorly differentiated adenocarcinoma with positive EGFR mutation with deletion in exon 19 diagnosed in September 2018.  The patient status post treatment with Gilotrif initially for 13 months discontinued secondary to disease progression.  She is started treatment with Tagrisso 80 mg p.o. daily  status post 12 months of treatment and has been tolerating this treatment well.  Unfortunately recent imaging studies showed suspicious disease progression in the lung. The patient was referred to Dr. Durenda Hurt at [facility] for second opinion and he discussed with her several options for management of her condition including systemic chemotherapy with carboplatin, paclitaxel, Avastin and Tecentriq versus chemotherapy with carboplatin and Alimta versus enrollment in a clinical trial with a combination of Avastin and Tecentriq. He also ordered guardant CDX test to rule out the presence of new mutation.  This test is still pending. I had a lengthy discussion with the patient today about these options and I recommended for her to continue her current treatment with Tagrisso for now until the molecular studies becomes available.  If the patient has any new actionable mutations, we will treat her with targeted therapy otherwise she may benefit from either enrollment in the clinical trial at [facility] or systemic chemotherapy with one of the above options. I will see the patient back for follow-up visit in early January 2021 for reevaluation and more discussion of her treatment options. For the dry cough I will send prescription of Hycodan 5 mL p.o. every 6 hours as needed to her pharmacy. The patient was advised to call immediately if she has any concerning symptoms in the interval. I discussed the assessment and treatment plan with the patient. The patient was provided an opportunity to ask questions and all were answered. The patient agreed with the plan and demonstrated an understanding of the instructions.   The patient was advised to call back or seek an in-person evaluation if the symptoms worsen or if the condition fails to improve as anticipated.  I provided 21 minutes of non face-to-face telephone visit time during this encounter, and > 50% was spent counseling  as documented under my assessment & plan.  Eilleen Kempf, MD 01/13/2019 11:22 AM  Disclaimer: This note was dictated with voice recognition software. Similar sounding words can inadvertently be transcribed and may not be corrected upon review.

## 2019-01-31 ENCOUNTER — Other Ambulatory Visit: Payer: Self-pay | Admitting: Family Medicine

## 2019-01-31 MED FILL — FUROSEMIDE 40 MG TAB: 40 | 30 days supply | Qty: 30 | Fill #2

## 2019-01-31 MED FILL — FLUTICASONE PROP 50 MCG SPR: 50 | 30 days supply | Qty: 16 | Fill #0

## 2019-01-31 MED FILL — POTASSIUM CHLORIDE CRYS ER: 20 | 30 days supply | Qty: 60 | Fill #5

## 2019-01-31 MED FILL — PANTOPRAZOLE SOD DR 40 MG T: 40 | 30 days supply | Qty: 30 | Fill #5

## 2019-02-02 ENCOUNTER — Telehealth: Payer: 59 | Admitting: Cardiothoracic Surgery

## 2019-02-02 ENCOUNTER — Other Ambulatory Visit: Payer: Self-pay

## 2019-02-03 ENCOUNTER — Other Ambulatory Visit: Payer: Self-pay

## 2019-02-03 ENCOUNTER — Encounter: Payer: Self-pay | Admitting: *Deleted

## 2019-02-03 ENCOUNTER — Telehealth: Payer: Self-pay | Admitting: Internal Medicine

## 2019-02-03 ENCOUNTER — Inpatient Hospital Stay (HOSPITAL_BASED_OUTPATIENT_CLINIC_OR_DEPARTMENT_OTHER): Payer: 59 | Admitting: Internal Medicine

## 2019-02-03 ENCOUNTER — Encounter: Payer: Self-pay | Admitting: Internal Medicine

## 2019-02-03 ENCOUNTER — Inpatient Hospital Stay: Payer: 59 | Attending: Oncology

## 2019-02-03 VITALS — Ht 66.0 in | Wt 216.2 lb

## 2019-02-03 DIAGNOSIS — R197 Diarrhea, unspecified: Secondary | ICD-10-CM | POA: Diagnosis not present

## 2019-02-03 DIAGNOSIS — Z5111 Encounter for antineoplastic chemotherapy: Secondary | ICD-10-CM | POA: Diagnosis present

## 2019-02-03 DIAGNOSIS — I10 Essential (primary) hypertension: Secondary | ICD-10-CM | POA: Diagnosis not present

## 2019-02-03 DIAGNOSIS — C3492 Malignant neoplasm of unspecified part of left bronchus or lung: Secondary | ICD-10-CM

## 2019-02-03 DIAGNOSIS — D649 Anemia, unspecified: Secondary | ICD-10-CM | POA: Diagnosis not present

## 2019-02-03 DIAGNOSIS — R21 Rash and other nonspecific skin eruption: Secondary | ICD-10-CM | POA: Diagnosis not present

## 2019-02-03 DIAGNOSIS — K5900 Constipation, unspecified: Secondary | ICD-10-CM | POA: Insufficient documentation

## 2019-02-03 DIAGNOSIS — R634 Abnormal weight loss: Secondary | ICD-10-CM | POA: Insufficient documentation

## 2019-02-03 DIAGNOSIS — C3490 Malignant neoplasm of unspecified part of unspecified bronchus or lung: Secondary | ICD-10-CM | POA: Diagnosis not present

## 2019-02-03 DIAGNOSIS — J90 Pleural effusion, not elsewhere classified: Secondary | ICD-10-CM

## 2019-02-03 DIAGNOSIS — Z5112 Encounter for antineoplastic immunotherapy: Secondary | ICD-10-CM | POA: Diagnosis present

## 2019-02-03 DIAGNOSIS — R110 Nausea: Secondary | ICD-10-CM | POA: Insufficient documentation

## 2019-02-03 DIAGNOSIS — R918 Other nonspecific abnormal finding of lung field: Secondary | ICD-10-CM

## 2019-02-03 DIAGNOSIS — Z7189 Other specified counseling: Secondary | ICD-10-CM

## 2019-02-03 DIAGNOSIS — C7931 Secondary malignant neoplasm of brain: Secondary | ICD-10-CM

## 2019-02-03 LAB — CMP (CANCER CENTER ONLY)
ALT: 6 U/L (ref 0–44)
AST: 11 U/L — ABNORMAL LOW (ref 15–41)
Albumin: 3.2 g/dL — ABNORMAL LOW (ref 3.5–5.0)
Alkaline Phosphatase: 94 U/L (ref 38–126)
Anion gap: 12 (ref 5–15)
BUN: 9 mg/dL (ref 6–20)
CO2: 27 mmol/L (ref 22–32)
Calcium: 9.2 mg/dL (ref 8.9–10.3)
Chloride: 100 mmol/L (ref 98–111)
Creatinine: 1.12 mg/dL — ABNORMAL HIGH (ref 0.44–1.00)
GFR, Est AFR Am: >60 mL/min (ref >60)
GFR, Estimated: 56 mL/min — ABNORMAL LOW (ref >60)
Glucose, Bld: 103 mg/dL — ABNORMAL HIGH (ref 70–99)
Potassium: 4.4 mmol/L (ref 3.5–5.1)
Sodium: 139 mmol/L (ref 135–145)
Total Bilirubin: 0.5 mg/dL (ref 0.3–1.2)
Total Protein: 7.3 g/dL (ref 6.5–8.1)

## 2019-02-03 LAB — CBC WITH DIFFERENTIAL (CANCER CENTER ONLY)
Abs Immature Granulocytes: 0.02 10*3/uL (ref 0.00–0.07)
Basophils Absolute: 0 10*3/uL (ref 0.0–0.1)
Basophils Relative: 0 %
Eosinophils Absolute: 0.1 10*3/uL (ref 0.0–0.5)
Eosinophils Relative: 1 %
HCT: 38.9 % (ref 36.0–46.0)
Hemoglobin: 12.1 g/dL (ref 12.0–15.0)
Immature Granulocytes: 0 %
Lymphocytes Relative: 15 %
Lymphs Abs: 1.4 10*3/uL (ref 0.7–4.0)
MCH: 27.4 pg (ref 26.0–34.0)
MCHC: 31.1 g/dL (ref 30.0–36.0)
MCV: 88 fL (ref 80.0–100.0)
Monocytes Absolute: 1.1 10*3/uL — ABNORMAL HIGH (ref 0.1–1.0)
Monocytes Relative: 11 %
Neutro Abs: 7.2 10*3/uL (ref 1.7–7.7)
Neutrophils Relative %: 73 %
Platelet Count: 303 10*3/uL (ref 150–400)
RBC: 4.42 MIL/uL (ref 3.87–5.11)
RDW: 15.4 % (ref 11.5–15.5)
WBC Count: 9.9 10*3/uL (ref 4.0–10.5)
nRBC: 0 % (ref 0.0–0.2)

## 2019-02-03 MED ORDER — METHYLPREDNISOLONE 4 MG PO TBPK: ORAL_TABLET | ORAL | 0 refills | Status: DC

## 2019-02-03 MED ORDER — CYANOCOBALAMIN 1000 MCG/ML IJ SOLN
1000.0000 ug | Freq: Once | INTRAMUSCULAR | Status: AC
  Administered 2019-02-03: 1000 ug via INTRAMUSCULAR
  Filled 2019-02-03: qty 1

## 2019-02-03 MED ORDER — PROCHLORPERAZINE MALEATE 10 MG PO TABS: 10.0000 mg | ORAL_TABLET | Freq: Four times a day (QID) | ORAL | 0 refills | Status: DC | PRN

## 2019-02-03 MED ORDER — ALBUTEROL SULFATE HFA 108 (90 BASE) MCG/ACT IN AERS: 2.0000 | INHALATION_SPRAY | Freq: Four times a day (QID) | RESPIRATORY_TRACT | 1 refills | Status: AC | PRN

## 2019-02-03 MED ORDER — FOLIC ACID 1 MG PO TABS: 1.0000 mg | ORAL_TABLET | Freq: Every day | ORAL | 4 refills | Status: DC

## 2019-02-03 MED ORDER — DEXAMETHASONE 4 MG PO TABS: ORAL_TABLET | ORAL | 1 refills | Status: DC

## 2019-02-03 MED FILL — VENTOLIN HFA 90 MCG INHALER: 108 (90 BAS | 25 days supply | Qty: 18 | Fill #0

## 2019-02-03 MED FILL — DEXAMETHASONE 4 MG TABLET: 4 | 21 days supply | Qty: 6 | Fill #0

## 2019-02-03 MED FILL — METHYLPREDNISOLONE 4 MG TBP: 4 | 6 days supply | Qty: 21 | Fill #0

## 2019-02-03 MED FILL — PROCHLORPERAZINE 10 MG TAB: 10 | 7 days supply | Qty: 30 | Fill #0

## 2019-02-03 MED FILL — FOLIC ACID 1 MG TABS: 1 | 30 days supply | Qty: 30 | Fill #0

## 2019-02-03 NOTE — Telephone Encounter (Signed)
Scheduled appt per 1/7 los - pt given AVS and calender per los.

## 2019-02-03 NOTE — Progress Notes (Signed)
START ON PATHWAY REGIMEN - Non-Small Cell Lung     A cycle is every 21 days:     Pemetrexed      Carboplatin      Bevacizumab-xxxx   **Always confirm dose/schedule in your pharmacy ordering system**  Patient Characteristics: Stage IV Metastatic, Nonsquamous, Initial Chemotherapy/Immunotherapy, PS = 0, 1, ALK Rearrangement Negative and ROS1 Rearrangement Negative and NTRK Gene Fusion?Negative and RET Gene Fusion?Negative and EGFR Mutation Negative/Non?Sensitizing, Not a  Candidate for Immunotherapy AJCC T Category: T3 Current Disease Status: Distant Metastases AJCC N Category: N2 AJCC M Category: M1a AJCC 8 Stage Grouping: IVA Histology: Nonsquamous Cell ROS1 Rearrangement Status: Negative T790M Mutation Status: Negative Following EGFR TKI Other Mutations/Biomarkers: No Other Actionable Mutations Chemotherapy/Immunotherapy LOT: Initial Chemotherapy/Immunotherapy Molecular Targeted Therapy: Not Appropriate MET Exon 14 Mutation Status: Negative RET Gene Fusion Status: Negative EGFR Mutation Status: Sensitizing Common NTRK Gene Fusion Status: Negative PD-L1 Expression Status: Quantity Not Sufficient ALK Rearrangement Status: Negative BRAF V600E Mutation Status: Positive ECOG Performance Status: 1 Biomarker Assessment Status Confirmation: All Genomic Markers Negative or Only MET+ or BRAF+ Immunotherapy Candidate Status: Not a Candidate for Immunotherapy Intent of Therapy: Non-Curative / Palliative Intent, Discussed with Patient

## 2019-02-03 NOTE — Progress Notes (Signed)
[place] [phone]   [phone]  OFFICE PROGRESS NOTE  Copland, Gay Filler, MD [address] [place] [address]  DIAGNOSIS: DIAGNOSIS: stage IV (T3, N2, M1 a) non-small cell lung cancer, poorly differentiated adenocarcinoma with extensive miliary distribution in the lungs bilaterally diagnosed in September 2018. 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 NEGATIVE for the Exon 20 T790M mutation. Repeat molecular studies by guardant 360 at time of progression showed ATM [id number] [facility] the [facility] of BRAF mutation, V600E  PRIOR THERAPY:  1) Gilotrif 40 mg daily started 11/08/2016.Status post 13 months of treatment. 2) Tagrisso 80 mg p.o. daily.  First dose started January 01, 2018.  Status post 12 months of treatment.   CURRENT THERAPY: 1) systemic chemotherapy with carboplatin for AUC 5, Alimta 500 mg/M2 and Avastin 15 mg/KG every 3 weeks.  First dose February 10, 2018.  INTERVAL HISTORY: [name] 54y.o. female returns to the clinic today for follow-up visit.  The patient continues to complain of increasing fatigue and weakness as well as shortness of breath and dry cough.  She also lost 30 pounds in the last few months.  She denied having any current chest pain or hemoptysis.  She denied having any nausea, vomiting, diarrhea or constipation.  She has no headache or visual changes.  She has been tolerating her treatment with Tagrisso fairly well.  The patient [facility] by guardant 360 that showed the emergence of BRAF mutation V600E.  Dr. SDurenda Hurtrecommended for the patient to proceed with systemic chemotherapy rather than a combination of BRAF inhibitor and Tagrisso.  She is here today for evaluation and more discussion of her treatment  options.   MEDICAL HISTORY: Past Medical History:  Diagnosis Date  . Adenocarcinoma of left lung, stage 4 (HOrd 10/28/2016  . Anemia   . Arthritis   . Constipation   . [place] [facility] [place] [place]    in remission 35 years +  . Dyspnea    gets "winded"  . GERD (gastroesophageal reflux disease)   . Goals of care, counseling/discussion 10/28/2016  . Hypertension   . Pneumonia    walking pneumonia in the late 80's  . Recurrent pleural effusion on left     ALLERGIES:  is allergic to percocet [oxycodone-acetaminophen]; lisinopril; and losartan.  MEDICATIONS:  Current Outpatient Medications  Medication Sig Dispense Refill  . clindamycin (CLINDAGEL) 1 % gel Apply 1 application topically 2 (two) times daily. 30 g 1  . fluticasone (FLONASE) 50 MCG/ACT nasal spray INSTILL 2 SPRAYS INTO BOTH NOSTRILS DAILY 16 g 0  . furosemide (LASIX) 40 MG tablet TAKE 1 TABLET BY MOUTH ONCE DAILY 90 tablet 1  . HYDROcodone-homatropine (HYCODAN) 5-1.5 MG/5ML syrup Take 5 mLs by mouth every 6 (six) hours as needed for cough. 240 mL 0  . loperamide (IMODIUM) 1 MG/5ML solution Take 2 mg as needed by mouth for diarrhea or loose stools.    . magnesium oxide (MAG-OX) 400 (241.3 Mg) MG tablet Take 1 tablet (400 mg total) by mouth daily.    . methocarbamol (ROBAXIN) 500 MG tablet TAKE 1 TABLET (500 MG TOTAL) BY MOUTH EVERY 8 (EIGHT) HOURS AS NEEDED FOR MUSCLE SPASMS. 30 tablet 5  . nystatin cream (MYCOSTATIN) Apply 1 application topically 2 (two) times daily. (Patient taking differently:  Apply 1 application topically daily as needed for dry skin. ) 30 g 0  . Oxycodone HCl 10 MG TABS TAKE 1 TABLET BY MOUTH 3 TIMES DAILY AS NEEDED 60 tablet 0  . pantoprazole (PROTONIX) 40 MG tablet TAKE 1 TABLET (40 MG TOTAL) BY MOUTH DAILY AT 12 NOON. (Patient taking differently: Take 40 mg by mouth daily. ) 90 tablet 3  . potassium chloride SA (K-DUR) 20 MEQ tablet TAKE 1 TABLET (20 MEQ TOTAL) BY MOUTH 2 TIMES DAILY. (Patient taking  differently: Take 20 mEq by mouth 2 (two) times daily. ) 180 tablet 3  . prochlorperazine (COMPAZINE) 10 MG tablet Take 1 tablet (10 mg total) every 6 (six) hours as needed by mouth for nausea or vomiting. 30 tablet 1  . senna-docusate (SENOKOT-S) 8.6-50 MG tablet Take 1 tablet by mouth at bedtime. (Patient taking differently: Take 1 tablet by mouth daily as needed for mild constipation. )    . TAGRISSO 80 MG tablet TAKE 1 TABLET BY MOUTH ONCE DAILY 30 tablet 0   No current facility-administered medications for this visit.    SURGICAL HISTORY:  Past Surgical History:  Procedure Laterality Date  . ABDOMINAL HYSTERECTOMY     partial hysterectomy  . BREAST EXCISIONAL BIOPSY Left 20+ yrs ago   benign  . CHEST TUBE INSERTION Left 10/26/2018   Procedure: INSERTION PLEURAL DRAINAGE CATHETER;  Surgeon: Ivin Poot, MD;  Location: [facility];  Service: Thoracic;  Laterality: Left;  . COLONOSCOPY    . PERICARDIAL WINDOW N/A 01/10/2017   Procedure: PERICARDIAL WINDOW- SUB XYPHOID, RIGHT CHEST TUBE;  Surgeon: Ivin Poot, MD;  Location: [facility];  Service: Thoracic;  Laterality: N/A;  . VIDEO BRONCHOSCOPY Bilateral 10/21/2016   Procedure: VIDEO BRONCHOSCOPY WITH FLUORO;  Surgeon: Tanda Rockers, MD;  Location: [facility];  Service: Cardiopulmonary;  Laterality: Bilateral;    REVIEW OF SYSTEMS:  Constitutional: positive for fatigue and weight loss Eyes: negative Ears, nose, mouth, throat, and face: negative Respiratory: positive for cough and dyspnea on exertion Cardiovascular: negative Gastrointestinal: negative Genitourinary:negative Integument/breast: negative Hematologic/lymphatic: negative Musculoskeletal:positive for back pain Neurological: negative Behavioral/Psych: negative Endocrine: negative Allergic/Immunologic: negative   PHYSICAL EXAMINATION: General appearance: alert, cooperative, fatigued and no distress Head: Normocephalic, without obvious abnormality, atraumatic Neck:  no adenopathy, no JVD, supple, symmetrical, trachea midline and thyroid not enlarged, symmetric, no tenderness/mass/nodules Lymph nodes: Cervical, supraclavicular, and axillary nodes normal. Resp: clear to auscultation bilaterally Back: symmetric, no curvature. ROM normal. No CVA tenderness. Cardio: regular rate and rhythm, S1, S2 normal, no murmur, click, rub or gallop GI: soft, non-tender; bowel sounds normal; no masses,  no organomegaly Extremities: extremities normal, atraumatic, no cyanosis or edema Neurologic: Alert and oriented X 3, normal strength and tone. Normal symmetric reflexes. Normal coordination and gait  ECOG PERFORMANCE STATUS: 1 - Symptomatic but completely ambulatory  Height 5' 6"  (1.676 m), weight 216 lb 3.2 oz (98.1 kg), last menstrual period 03/15/2010.  LABORATORY DATA: Lab Results  Component Value Date   WBC 9.9 02/03/2019   HGB 12.1 02/03/2019   HCT 38.9 02/03/2019   MCV 88.0 02/03/2019   PLT 303 02/03/2019      Chemistry      Component Value Date/Time   NA 140 12/31/2018 1023   NA 135 (L) 01/08/2017 1055   K 4.0 12/31/2018 1023   K 3.2 (L) 01/08/2017 1055   CL 101 12/31/2018 1023   CO2 29 12/31/2018 1023   CO2 25 01/08/2017 1055   BUN 10  12/31/2018 1023   BUN 14.6 01/08/2017 1055   CREATININE 1.20 (H) 12/31/2018 1023   CREATININE 1.3 (H) 01/08/2017 1055      Component Value Date/Time   CALCIUM 9.7 12/31/2018 1023   CALCIUM 9.5 01/08/2017 1055   ALKPHOS 90 12/31/2018 1023   ALKPHOS 82 01/08/2017 1055   AST 14 (L) 12/31/2018 1023   AST 31 01/08/2017 1055   ALT 9 12/31/2018 1023   ALT 34 01/08/2017 1055   BILITOT 0.4 12/31/2018 1023   BILITOT 1.74 (H) 01/08/2017 1055       RADIOGRAPHIC STUDIES: No results found.  ASSESSMENT AND PLAN: This is a very pleasant 54 years old African-American female with metastatic non-small cell lung cancer, adenocarcinoma and positive for EGFR mutation with deletion in exon 19.  The patient is currently on  treatment with GILOTRIF 40 mg p.o. daily status post 13 months.  The patient is tolerating this treatment well except for few episodes of diarrhea and mild skin rash. She is complaining of dizzy spells and vertigo recently. Repeat CT scan of the chest, abdomen and pelvis performed recently showed some evidence for disease progression with increase in size and number of pulmonary nodules. Her treatment was switched to Tagrisso 80 mg p.o. daily started January 01, 2018.  She is status post 12 months of treatment The patient has been tolerating this treatment well with no concerning adverse effects. She had repeat MRI of the brain as well as CT scan of the chest, abdomen pelvis performed recently. Her MRI of the brain showed no concerning findings for disease progression in the brain but CT scan of the chest showed some evidence for progression with lymphangitic spread of the disease. She was referred to Dr. Durenda Hurt at [facility] and repeat molecular studies showed the emergence of BRAF mutation V600E. I recommended for the patient to consider systemic chemotherapy and she is here today for evaluation and discussion of this option.  I had a lengthy discussion with the patient today regarding her condition.  She understands that a combination of BRAF inhibitor and Tagrisso has not been well studied at this point. I recommended for the patient to consider systemic chemotherapy with carboplatin for AUC of 5, Alimta 500 mg/M2 and Avastin 15 mg/KG every 3 weeks.  She was also given the option of palliative care and hospice referral. The patient is interested in proceeding with systemic chemotherapy.  We discussed with her the adverse effect of this treatment including but not limited to alopecia, myelosuppression, nausea and vomiting, peripheral neuropathy, liver or renal dysfunction as well as bleeding issues from Avastin. I will also keep the patient on her current treatment with Tagrisso for now to  prevent a flare of her disease and also based on some reports from [place] investigators that showed better progression free survival and overall survival for a combination of chemotherapy was targeted therapy and patient was EGFR mutation. The patient is expected to start the first cycle of her treatment next week. She will come back for follow-up visit in 2 weeks for evaluation and management of any adverse effect of her treatment. For the lack of appetite and weight loss, I started the patient on Medrol Dosepak. We will arrange for the patient to receive vitamin B12 injection today and I will send prescription of folic acid, Compazine and Decadron to her pharmacy. For the shortness of breath and wheezing, I also started the patient on albuterol inhaler on as-needed basis. The patient was advised  to call immediately if she has any concerning symptoms in the interval. The patient voices understanding of current disease status and treatment options and is in agreement with the current care plan. All questions were answered. The patient knows to call the clinic with any problems, questions or concerns. We can certainly see the patient much sooner if necessary.  Disclaimer: This note was dictated with voice recognition software. Similar sounding words can inadvertently be transcribed and may not be corrected upon review.

## 2019-02-03 NOTE — Progress Notes (Signed)
Oncology Nurse Navigator Documentation  Oncology Nurse Navigator Flowsheets 02/03/2019  Abnormal Finding Date -  Confirmed Diagnosis Date -  Navigator Location [facility]  Referral Date to RadOnc/MedOnc -  Navigator Encounter Type Clinic/MDC/I spoke with Nancy Moore today in clinic.  She is not feeling well today and I listened as she explained.  Dr. Julien Nordmann is updated and provided help with issues.  Nancy Moore has disease progression and will be starting on chemo. I gave and explained her treatment regimen.  I offered her encouragement and support. I did updated CSW on patient change in treatment plan.  I will reach out to dietitian to update her on patients weight loss.  I asked patient that she call me if needed.   Telephone -  Treatment Initiated Date 11/08/2016  Patient Visit Type MedOnc  Treatment Phase Treatment;Other  Barriers/Navigation Needs Coordination of Care;Education  Education Other  Interventions Coordination of Care;Education  Acuity Level 2-Minimal Needs (1-2 Barriers Identified)  Referrals -  Coordination of Care Other  Education Method Verbal;Written  Time Spent with Patient 53

## 2019-02-07 ENCOUNTER — Other Ambulatory Visit: Payer: Self-pay | Admitting: Internal Medicine

## 2019-02-07 DIAGNOSIS — C3490 Malignant neoplasm of unspecified part of unspecified bronchus or lung: Secondary | ICD-10-CM

## 2019-02-08 ENCOUNTER — Inpatient Hospital Stay: Payer: 59

## 2019-02-08 ENCOUNTER — Other Ambulatory Visit: Payer: Self-pay

## 2019-02-08 DIAGNOSIS — Z5112 Encounter for antineoplastic immunotherapy: Secondary | ICD-10-CM | POA: Diagnosis not present

## 2019-02-08 DIAGNOSIS — C3492 Malignant neoplasm of unspecified part of left bronchus or lung: Secondary | ICD-10-CM

## 2019-02-08 LAB — CBC WITH DIFFERENTIAL (CANCER CENTER ONLY)
Abs Immature Granulocytes: 0.07 10*3/uL (ref 0.00–0.07)
Basophils Absolute: 0 10*3/uL (ref 0.0–0.1)
Basophils Relative: 0 %
Eosinophils Absolute: 0.1 10*3/uL (ref 0.0–0.5)
Eosinophils Relative: 1 %
HCT: 39.3 % (ref 36.0–46.0)
Hemoglobin: 12 g/dL (ref 12.0–15.0)
Immature Granulocytes: 1 %
Lymphocytes Relative: 14 %
Lymphs Abs: 1.8 10*3/uL (ref 0.7–4.0)
MCH: 26.9 pg (ref 26.0–34.0)
MCHC: 30.5 g/dL (ref 30.0–36.0)
MCV: 88.1 fL (ref 80.0–100.0)
Monocytes Absolute: 1.8 10*3/uL — ABNORMAL HIGH (ref 0.1–1.0)
Monocytes Relative: 15 %
Neutro Abs: 8.6 10*3/uL — ABNORMAL HIGH (ref 1.7–7.7)
Neutrophils Relative %: 69 %
Platelet Count: 325 10*3/uL (ref 150–400)
RBC: 4.46 MIL/uL (ref 3.87–5.11)
RDW: 15.5 % (ref 11.5–15.5)
WBC Count: 12.5 10*3/uL — ABNORMAL HIGH (ref 4.0–10.5)
nRBC: 0 % (ref 0.0–0.2)

## 2019-02-08 LAB — CMP (CANCER CENTER ONLY)
ALT: 10 U/L (ref 0–44)
AST: 13 U/L — ABNORMAL LOW (ref 15–41)
Albumin: 3.4 g/dL — ABNORMAL LOW (ref 3.5–5.0)
Alkaline Phosphatase: 97 U/L (ref 38–126)
Anion gap: 11 (ref 5–15)
BUN: 12 mg/dL (ref 6–20)
CO2: 29 mmol/L (ref 22–32)
Calcium: 9 mg/dL (ref 8.9–10.3)
Chloride: 102 mmol/L (ref 98–111)
Creatinine: 1.1 mg/dL — ABNORMAL HIGH (ref 0.44–1.00)
GFR, Est AFR Am: >60 mL/min (ref >60)
GFR, Estimated: 58 mL/min — ABNORMAL LOW (ref >60)
Glucose, Bld: 88 mg/dL (ref 70–99)
Potassium: 4.1 mmol/L (ref 3.5–5.1)
Sodium: 142 mmol/L (ref 135–145)
Total Bilirubin: 0.3 mg/dL (ref 0.3–1.2)
Total Protein: 7.4 g/dL (ref 6.5–8.1)

## 2019-02-09 ENCOUNTER — Telehealth: Payer: Self-pay | Admitting: *Deleted

## 2019-02-09 ENCOUNTER — Encounter: Payer: Self-pay | Admitting: *Deleted

## 2019-02-09 ENCOUNTER — Other Ambulatory Visit: Payer: Self-pay | Admitting: *Deleted

## 2019-02-09 ENCOUNTER — Telehealth: Payer: 59 | Admitting: Cardiothoracic Surgery

## 2019-02-09 ENCOUNTER — Telehealth: Payer: Self-pay | Admitting: Cardiothoracic Surgery

## 2019-02-09 DIAGNOSIS — C3490 Malignant neoplasm of unspecified part of unspecified bronchus or lung: Secondary | ICD-10-CM

## 2019-02-09 NOTE — Telephone Encounter (Signed)
[address]       [place] [address]             [phone]     CARDIOTHORACIC SURGERY TELEPHONE VIRTUAL OFFICE NOTE  Referring Provider is No ref. provider found Primary Cardiologist is No primary care provider on file. PCP is Copland, Gay Filler, MD   HPI:  I spoke with Nancy Moore (DOB 1965/08/08 ) via telephone on 02/09/2019 at 11:32 AM and verified that I was speaking with the correct person using more than one form of identification.  We discussed the reason(s) for conducting our visit virtually instead of in-person.  The patient expressed understanding the circumstances and agreed to proceed as described.     Patient had a left Port-A-Cath placed last year for malignant left pleural effusion, history of adenocarcinoma lung.  Drainage has significantly dropped off and is only scant drainage for the past 3 sessions.  We will plan on removing the catheter next week.  Also at that time she will have an outpatient right Port-A-Cath placement as unfortunately she has developed lymphangitic tumor spread in her left lung.  I discussed the procedure of right  Port-A-Cath placement with combined left Pleurx catheter removal under MAC anesthesia at [facility] next week.  Scheduled for January 22.  She understands the risks of bleeding, pneumothorax pain and infection.  I discussed the benefit and utility of having a Port-A-Cath for chemotherapy infusion and blood work.  A chest x-ray will be obtained prior to procedure to make sure the left pleural effusion remains minimal.   Current Outpatient Medications  Medication Sig Dispense Refill  . albuterol (VENTOLIN HFA) 108 (90 Base) MCG/ACT inhaler Inhale 2 puffs into the lungs every 6 (six) hours as needed for wheezing or shortness of breath. 8 g 1  . clindamycin (CLINDAGEL) 1 % gel Apply 1 application topically 2 (two) times daily. 30 g 1  . dexamethasone (DECADRON) 4 MG tablet 1 tablet p.o. twice daily the day  before, day of and day after chemotherapy every 3 weeks 40 tablet 1  . fluticasone (FLONASE) 50 MCG/ACT nasal spray INSTILL 2 SPRAYS INTO BOTH NOSTRILS DAILY 16 g 0  . folic acid (FOLVITE) 1 MG tablet Take 1 tablet (1 mg total) by mouth daily. 30 tablet 4  . furosemide (LASIX) 40 MG tablet TAKE 1 TABLET BY MOUTH ONCE DAILY 90 tablet 1  . HYDROcodone-homatropine (HYCODAN) 5-1.5 MG/5ML syrup Take 5 mLs by mouth every 6 (six) hours as needed for cough. 240 mL 0  . loperamide (IMODIUM) 1 MG/5ML solution Take 2 mg as needed by mouth for diarrhea or loose stools.    . magnesium oxide (MAG-OX) 400 (241.3 Mg) MG tablet Take 1 tablet (400 mg total) by mouth daily.    . methocarbamol (ROBAXIN) 500 MG tablet TAKE 1 TABLET (500 MG TOTAL) BY MOUTH EVERY 8 (EIGHT) HOURS AS NEEDED FOR MUSCLE SPASMS. 30 tablet 5  . methylPREDNISolone (MEDROL DOSEPAK) 4 MG TBPK tablet Use as instructed. 21 tablet 0  . nystatin cream (MYCOSTATIN) Apply 1 application topically 2 (two) times daily. (Patient taking differently: Apply 1 application topically daily as needed for dry skin. ) 30 g 0  . Oxycodone HCl 10 MG TABS TAKE 1 TABLET BY MOUTH 3 TIMES DAILY AS NEEDED 60 tablet 0  . pantoprazole (PROTONIX) 40 MG tablet TAKE 1 TABLET (40 MG TOTAL) BY MOUTH DAILY AT 12 NOON. (Patient taking differently: Take 40 mg by mouth  daily. ) 90 tablet 3  . potassium chloride SA (K-DUR) 20 MEQ tablet TAKE 1 TABLET (20 MEQ TOTAL) BY MOUTH 2 TIMES DAILY. (Patient taking differently: Take 20 mEq by mouth 2 (two) times daily. ) 180 tablet 3  . prochlorperazine (COMPAZINE) 10 MG tablet Take 1 tablet (10 mg total) every 6 (six) hours as needed by mouth for nausea or vomiting. 30 tablet 1  . prochlorperazine (COMPAZINE) 10 MG tablet Take 1 tablet (10 mg total) by mouth every 6 (six) hours as needed for nausea or vomiting. 30 tablet 0  . senna-docusate (SENOKOT-S) 8.6-50 MG tablet Take 1 tablet by mouth at bedtime. (Patient taking differently: Take 1 tablet  by mouth daily as needed for mild constipation. )    . TAGRISSO 80 MG tablet TAKE 1 TABLET BY MOUTH ONCE DAILY 30 tablet 0   No current facility-administered medications for this visit.     Diagnostic Tests:  None   Impression: Progression of non-small cell carcinoma with lymphangitic spread of the left lung with systemic chemotherapy plan. History of left malignant pleural effusion status post Pleurx catheter placement.  The malignant effusion has significantly improved with scant output the last week and a half  Plan: Patient repair for outpatient right Port-A-Cath placement January 22 at [facility].  At that time we will remove the left  Pleurx catheter as well.      I discussed limitations of evaluation and management via telephone.  The patient was advised to call back for repeat telephone consultation or to seek an in-person evaluation if questions arise or the patient's clinical condition changes in any significant manner.  I spent in excess of 10 minutes of non-face-to-face time during the conduct of this telephone virtual office consultation.  Level 1  (99441)             5-10 minutes Level 2  (99442)            11-20 minutes Level 3  (99443)            21-30 minutes    02/09/2019 11:32 AM

## 2019-02-09 NOTE — Telephone Encounter (Signed)
Oncology Nurse Navigator Documentation  Oncology Nurse Navigator Flowsheets 02/09/2019  Abnormal Finding Date -  Confirmed Diagnosis Date -  Navigator Location [facility]  Referral Date to RadOnc/MedOnc -  Navigator Encounter Type Telephone/I called to check on Nancy Moore today.  She is feeling better about her plan of care.  She said she "had a moment" yesterday but is feeling better today.  I offered support and encouragement.   Telephone Outgoing Call  Treatment Initiated Date -  Patient Visit Type -  Treatment Phase Treatment  Barriers/Navigation Needs Education  Education Other  Interventions Education  Acuity Level 2-Minimal Needs (1-2 Barriers Identified)  Referrals -  Coordination of Care -  Education Method Verbal  Time Spent with Patient 30

## 2019-02-09 NOTE — Progress Notes (Signed)
Oncology Nurse Navigator Documentation  Oncology Nurse Navigator Flowsheets 02/09/2019  Abnormal Finding Date -  Confirmed Diagnosis Date -  Navigator Location [facility]  Referral Date to RadOnc/MedOnc -  Navigator Encounter Type Other/I followed up on Nancy Moore schedule. She is set up for chemo this Friday.  She would like a port placed and I noticed she was having an appt with Dr. Darcey Nora today.  I called his office and updated them that patient would like a port and they were going to update Dr. Darcey Nora.  I also sent him a secure IM on patient's request.    Telephone -  Treatment Initiated Date -  Patient Visit Type -  Treatment Phase Treatment;Other  Barriers/Navigation Needs Coordination of Care  Education -  Interventions Coordination of Care  Acuity Level 2-Minimal Needs (1-2 Barriers Identified)  Referrals -  Coordination of Care Other  Education Method -  Time Spent with Patient 15

## 2019-02-09 NOTE — Progress Notes (Unsigned)
w as

## 2019-02-10 ENCOUNTER — Telehealth: Payer: Self-pay | Admitting: *Deleted

## 2019-02-10 NOTE — Telephone Encounter (Signed)
Received vm call from pt asking about taking compazine & states she has some nausea & feeling like vomiting in mornings.  Returned call & discussed OK to take compazine @ 30 minutes before she tries to eat.  Discussed Decadron & informed to take with food.  She states that she is trying some home remedies & so far has been able to get steroid in.  She is for her first treatment tomorrow.

## 2019-02-11 ENCOUNTER — Inpatient Hospital Stay: Payer: 59 | Admitting: Nutrition

## 2019-02-11 ENCOUNTER — Inpatient Hospital Stay: Payer: 59

## 2019-02-11 ENCOUNTER — Other Ambulatory Visit: Payer: Self-pay | Admitting: Medical Oncology

## 2019-02-11 ENCOUNTER — Other Ambulatory Visit: Payer: Self-pay

## 2019-02-11 ENCOUNTER — Other Ambulatory Visit: Payer: 59

## 2019-02-11 VITALS — BP 105/79 | HR 95 | Temp 97.8°F | Resp 17 | Ht 66.0 in | Wt 213.0 lb

## 2019-02-11 DIAGNOSIS — C3490 Malignant neoplasm of unspecified part of unspecified bronchus or lung: Secondary | ICD-10-CM

## 2019-02-11 DIAGNOSIS — C7931 Secondary malignant neoplasm of brain: Secondary | ICD-10-CM

## 2019-02-11 DIAGNOSIS — Z5112 Encounter for antineoplastic immunotherapy: Secondary | ICD-10-CM | POA: Diagnosis not present

## 2019-02-11 LAB — TOTAL PROTEIN, URINE DIPSTICK: Protein, ur: NEGATIVE mg/dL

## 2019-02-11 MED ORDER — SODIUM CHLORIDE 0.9 % IV SOLN
15.0000 mg/kg | Freq: Once | INTRAVENOUS | Status: AC
  Administered 2019-02-11: 1500 mg via INTRAVENOUS
  Filled 2019-02-11: qty 48

## 2019-02-11 MED ORDER — SODIUM CHLORIDE 0.9 % IV SOLN
580.0000 mg | Freq: Once | INTRAVENOUS | Status: AC
  Administered 2019-02-11: 580 mg via INTRAVENOUS
  Filled 2019-02-11: qty 58

## 2019-02-11 MED ORDER — DEXAMETHASONE SODIUM PHOSPHATE 10 MG/ML IJ SOLN
INTRAMUSCULAR | Status: AC
  Filled 2019-02-11: qty 1

## 2019-02-11 MED ORDER — PALONOSETRON HCL INJECTION 0.25 MG/5ML
0.2500 mg | Freq: Once | INTRAVENOUS | Status: AC
  Administered 2019-02-11: 0.25 mg via INTRAVENOUS

## 2019-02-11 MED ORDER — PALONOSETRON HCL INJECTION 0.25 MG/5ML
INTRAVENOUS | Status: AC
  Filled 2019-02-11: qty 5

## 2019-02-11 MED ORDER — SODIUM CHLORIDE 0.9 % IV SOLN
Freq: Once | INTRAVENOUS | Status: AC
  Filled 2019-02-11: qty 250

## 2019-02-11 MED ORDER — DEXAMETHASONE SODIUM PHOSPHATE 10 MG/ML IJ SOLN
10.0000 mg | Freq: Once | INTRAMUSCULAR | Status: AC
  Administered 2019-02-11: 10 mg via INTRAVENOUS

## 2019-02-11 MED ORDER — SODIUM CHLORIDE 0.9 % IV SOLN
500.0000 mg/m2 | Freq: Once | INTRAVENOUS | Status: AC
  Administered 2019-02-11: 1100 mg via INTRAVENOUS
  Filled 2019-02-11: qty 40

## 2019-02-11 MED ORDER — SODIUM CHLORIDE 0.9 % IV SOLN
150.0000 mg | Freq: Once | INTRAVENOUS | Status: AC
  Administered 2019-02-11: 150 mg via INTRAVENOUS
  Filled 2019-02-11: qty 150

## 2019-02-11 NOTE — Patient Instructions (Signed)
[facility] Discharge Instructions for Patients Receiving Chemotherapy  Today you received the following chemotherapy agents: Bevicazumab-bvzr Noah Charon), Pemetrexed (Alimta), and Carboplatin (Paraplatin)  To help prevent nausea and vomiting after your treatment, we encourage you to take your nausea medication as directed.   If you develop nausea and vomiting that is not controlled by your nausea medication, call the clinic.   BELOW ARE SYMPTOMS THAT SHOULD BE REPORTED IMMEDIATELY:  *FEVER GREATER THAN 100.5 F  *CHILLS WITH OR WITHOUT FEVER  NAUSEA AND VOMITING THAT IS NOT CONTROLLED WITH YOUR NAUSEA MEDICATION  *UNUSUAL SHORTNESS OF BREATH  *UNUSUAL BRUISING OR BLEEDING  TENDERNESS IN MOUTH AND THROAT WITH OR WITHOUT PRESENCE OF ULCERS  *URINARY PROBLEMS  *BOWEL PROBLEMS  UNUSUAL RASH Items with * indicate a potential emergency and should be followed up as soon as possible.  Feel free to call the clinic should you have any questions or concerns. The clinic phone number is [phone].  Please show the [facility] at check-in to the Emergency Department and triage nurse.  Bevacizumab injection What is this medicine? BEVACIZUMAB (be va SIZ yoo mab) is a monoclonal antibody. It is used to treat many types of cancer. This medicine may be used for other purposes; ask your health care provider or pharmacist if you have questions. COMMON BRAND NAME(S): Avastin, MVASI, Zirabev What should I tell my health care provider before I take this medicine? They need to know if you have any of these conditions:  diabetes  heart disease  high blood pressure  history of coughing up blood  prior anthracycline chemotherapy (e.g., doxorubicin, daunorubicin, epirubicin)  recent or ongoing radiation therapy  recent or planning to have surgery  stroke  an unusual or allergic reaction to bevacizumab, hamster proteins, mouse proteins, other medicines, foods, dyes,  or preservatives  pregnant or trying to get pregnant  breast-feeding How should I use this medicine? This medicine is for infusion into a vein. It is given by a health care professional in a hospital or clinic setting. Talk to your pediatrician regarding the use of this medicine in children. Special care may be needed. Overdosage: If you think you have taken too much of this medicine contact a poison control center or emergency room at once. NOTE: This medicine is only for you. Do not share this medicine with others. What if I miss a dose? It is important not to miss your dose. Call your doctor or health care professional if you are unable to keep an appointment. What may interact with this medicine? Interactions are not expected. This list may not describe all possible interactions. Give your health care provider a list of all the medicines, herbs, non-prescription drugs, or dietary supplements you use. Also tell them if you smoke, drink alcohol, or use illegal drugs. Some items may interact with your medicine. What should I watch for while using this medicine? Your condition will be monitored carefully while you are receiving this medicine. You will need important blood work and urine testing done while you are taking this medicine. This medicine may increase your risk to bruise or bleed. Call your doctor or health care professional if you notice any unusual bleeding. Before having surgery, talk to your health care provider to make sure it is ok. This drug can increase the risk of poor healing of your surgical site or wound. You will need to stop this drug for 28 days before surgery. After surgery, wait at least 28 days before restarting  this drug. Make sure the surgical site or wound is healed enough before restarting this drug. Talk to your health care provider if questions. Do not become pregnant while taking this medicine or for 6 months after stopping it. Women should inform their doctor if  they wish to become pregnant or think they might be pregnant. There is a potential for serious side effects to an unborn child. Talk to your health care professional or pharmacist for more information. Do not breast-feed an infant while taking this medicine and for 6 months after the last dose. This medicine has caused ovarian failure in some women. This medicine may interfere with the ability to have a child. You should talk to your doctor or health care professional if you are concerned about your fertility. What side effects may I notice from receiving this medicine? Side effects that you should report to your doctor or health care professional as soon as possible:  allergic reactions like skin rash, itching or hives, swelling of the face, lips, or tongue  chest pain or chest tightness  chills  coughing up blood  high fever  seizures  severe constipation  signs and symptoms of bleeding such as bloody or black, tarry stools; red or dark-brown urine; spitting up blood or brown material that looks like coffee grounds; red spots on the skin; unusual bruising or bleeding from the eye, gums, or nose  signs and symptoms of a blood clot such as breathing problems; chest pain; severe, sudden headache; pain, swelling, warmth in the leg  signs and symptoms of a stroke like changes in vision; confusion; trouble speaking or understanding; severe headaches; sudden numbness or weakness of the face, arm or leg; trouble walking; dizziness; loss of balance or coordination  stomach pain  sweating  swelling of legs or ankles  vomiting  weight gain Side effects that usually do not require medical attention (report to your doctor or health care professional if they continue or are bothersome):  back pain  changes in taste  decreased appetite  dry skin  nausea  tiredness This list may not describe all possible side effects. Call your doctor for medical advice about side effects. You may  report side effects to FDA at 1-800-FDA-1088. Where should I keep my medicine? This drug is given in a hospital or clinic and will not be stored at home. NOTE: This sheet is a summary. It may not cover all possible information. If you have questions about this medicine, talk to your doctor, pharmacist, or health care provider.  2020 Elsevier/Gold Standard (2018-11-10 10:50:46)  Pemetrexed injection What is this medicine? PEMETREXED (PEM e TREX ed) is a chemotherapy drug used to treat lung cancers like non-small cell lung cancer and mesothelioma. It may also be used to treat other cancers. This medicine may be used for other purposes; ask your health care provider or pharmacist if you have questions. COMMON BRAND NAME(S): Alimta What should I tell my health care provider before I take this medicine? They need to know if you have any of these conditions:  infection (especially a virus infection such as chickenpox, cold sores, or herpes)  kidney disease  low blood counts, like low white cell, platelet, or red cell counts  lung or breathing disease, like asthma  radiation therapy  an unusual or allergic reaction to pemetrexed, other medicines, foods, dyes, or preservative  pregnant or trying to get pregnant  breast-feeding How should I use this medicine? This drug is given as an infusion into  a vein. It is administered in a hospital or clinic by a specially trained health care professional. Talk to your pediatrician regarding the use of this medicine in children. Special care may be needed. Overdosage: If you think you have taken too much of this medicine contact a poison control center or emergency room at once. NOTE: This medicine is only for you. Do not share this medicine with others. What if I miss a dose? It is important not to miss your dose. Call your doctor or health care professional if you are unable to keep an appointment. What may interact with this medicine? This  medicine may interact with the following medications:  Ibuprofen This list may not describe all possible interactions. Give your health care provider a list of all the medicines, herbs, non-prescription drugs, or dietary supplements you use. Also tell them if you smoke, drink alcohol, or use illegal drugs. Some items may interact with your medicine. What should I watch for while using this medicine? Visit your doctor for checks on your progress. This drug may make you feel generally unwell. This is not uncommon, as chemotherapy can affect healthy cells as well as cancer cells. Report any side effects. Continue your course of treatment even though you feel ill unless your doctor tells you to stop. In some cases, you may be given additional medicines to help with side effects. Follow all directions for their use. Call your doctor or health care professional for advice if you get a fever, chills or sore throat, or other symptoms of a cold or flu. Do not treat yourself. This drug decreases your body's ability to fight infections. Try to avoid being around people who are sick. This medicine may increase your risk to bruise or bleed. Call your doctor or health care professional if you notice any unusual bleeding. Be careful brushing and flossing your teeth or using a toothpick because you may get an infection or bleed more easily. If you have any dental work done, tell your dentist you are receiving this medicine. Avoid taking products that contain aspirin, acetaminophen, ibuprofen, naproxen, or ketoprofen unless instructed by your doctor. These medicines may hide a fever. Call your doctor or health care professional if you get diarrhea or mouth sores. Do not treat yourself. To protect your kidneys, drink water or other fluids as directed while you are taking this medicine. Do not become pregnant while taking this medicine or for 6 months after stopping it. Women should inform their doctor if they wish to  become pregnant or think they might be pregnant. Men should not father a child while taking this medicine and for 3 months after stopping it. This may interfere with the ability to father a child. You should talk to your doctor or health care professional if you are concerned about your fertility. There is a potential for serious side effects to an unborn child. Talk to your health care professional or pharmacist for more information. Do not breast-feed an infant while taking this medicine or for 1 week after stopping it. What side effects may I notice from receiving this medicine? Side effects that you should report to your doctor or health care professional as soon as possible:  allergic reactions like skin rash, itching or hives, swelling of the face, lips, or tongue  breathing problems  redness, blistering, peeling or loosening of the skin, including inside the mouth  signs and symptoms of bleeding such as bloody or black, tarry stools; red or dark-brown urine; spitting  up blood or brown material that looks like coffee grounds; red spots on the skin; unusual bruising or bleeding from the eye, gums, or nose  signs and symptoms of infection like fever or chills; cough; sore throat; pain or trouble passing urine  signs and symptoms of kidney injury like trouble passing urine or change in the amount of urine  signs and symptoms of liver injury like dark yellow or brown urine; general ill feeling or flu-like symptoms; light-colored stools; loss of appetite; nausea; right upper belly pain; unusually weak or tired; yellowing of the eyes or skin Side effects that usually do not require medical attention (report to your doctor or health care professional if they continue or are bothersome):  constipation  mouth sores  nausea, vomiting  unusually weak or tired This list may not describe all possible side effects. Call your doctor for medical advice about side effects. You may report side effects  to FDA at 1-800-FDA-1088. Where should I keep my medicine? This drug is given in a hospital or clinic and will not be stored at home. NOTE: This sheet is a summary. It may not cover all possible information. If you have questions about this medicine, talk to your doctor, pharmacist, or health care provider.  2020 Elsevier/Gold Standard (2017-03-04 16:11:33)  Carboplatin injection What is this medicine? CARBOPLATIN (KAR boe pla tin) is a chemotherapy drug. It targets fast dividing cells, like cancer cells, and causes these cells to die. This medicine is used to treat ovarian cancer and many other cancers. This medicine may be used for other purposes; ask your health care provider or pharmacist if you have questions. COMMON BRAND NAME(S): Paraplatin What should I tell my health care provider before I take this medicine? They need to know if you have any of these conditions:  blood disorders  hearing problems  kidney disease  recent or ongoing radiation therapy  an unusual or allergic reaction to carboplatin, cisplatin, other chemotherapy, other medicines, foods, dyes, or preservatives  pregnant or trying to get pregnant  breast-feeding How should I use this medicine? This drug is usually given as an infusion into a vein. It is administered in a hospital or clinic by a specially trained health care professional. Talk to your pediatrician regarding the use of this medicine in children. Special care may be needed. Overdosage: If you think you have taken too much of this medicine contact a poison control center or emergency room at once. NOTE: This medicine is only for you. Do not share this medicine with others. What if I miss a dose? It is important not to miss a dose. Call your doctor or health care professional if you are unable to keep an appointment. What may interact with this medicine?  medicines for seizures  medicines to increase blood counts like filgrastim, pegfilgrastim,  sargramostim  some antibiotics like amikacin, gentamicin, neomycin, streptomycin, tobramycin  vaccines Talk to your doctor or health care professional before taking any of these medicines:  acetaminophen  aspirin  ibuprofen  ketoprofen  naproxen This list may not describe all possible interactions. Give your health care provider a list of all the medicines, herbs, non-prescription drugs, or dietary supplements you use. Also tell them if you smoke, drink alcohol, or use illegal drugs. Some items may interact with your medicine. What should I watch for while using this medicine? Your condition will be monitored carefully while you are receiving this medicine. You will need important blood work done while you are taking this  medicine. This drug may make you feel generally unwell. This is not uncommon, as chemotherapy can affect healthy cells as well as cancer cells. Report any side effects. Continue your course of treatment even though you feel ill unless your doctor tells you to stop. In some cases, you may be given additional medicines to help with side effects. Follow all directions for their use. Call your doctor or health care professional for advice if you get a fever, chills or sore throat, or other symptoms of a cold or flu. Do not treat yourself. This drug decreases your body's ability to fight infections. Try to avoid being around people who are sick. This medicine may increase your risk to bruise or bleed. Call your doctor or health care professional if you notice any unusual bleeding. Be careful brushing and flossing your teeth or using a toothpick because you may get an infection or bleed more easily. If you have any dental work done, tell your dentist you are receiving this medicine. Avoid taking products that contain aspirin, acetaminophen, ibuprofen, naproxen, or ketoprofen unless instructed by your doctor. These medicines may hide a fever. Do not become pregnant while taking  this medicine. Women should inform their doctor if they wish to become pregnant or think they might be pregnant. There is a potential for serious side effects to an unborn child. Talk to your health care professional or pharmacist for more information. Do not breast-feed an infant while taking this medicine. What side effects may I notice from receiving this medicine? Side effects that you should report to your doctor or health care professional as soon as possible:  allergic reactions like skin rash, itching or hives, swelling of the face, lips, or tongue  signs of infection - fever or chills, cough, sore throat, pain or difficulty passing urine  signs of decreased platelets or bleeding - bruising, pinpoint red spots on the skin, black, tarry stools, nosebleeds  signs of decreased red blood cells - unusually weak or tired, fainting spells, lightheadedness  breathing problems  changes in hearing  changes in vision  chest pain  high blood pressure  low blood counts - This drug may decrease the number of white blood cells, red blood cells and platelets. You may be at increased risk for infections and bleeding.  nausea and vomiting  pain, swelling, redness or irritation at the injection site  pain, tingling, numbness in the hands or feet  problems with balance, talking, walking  trouble passing urine or change in the amount of urine Side effects that usually do not require medical attention (report to your doctor or health care professional if they continue or are bothersome):  hair loss  loss of appetite  metallic taste in the mouth or changes in taste This list may not describe all possible side effects. Call your doctor for medical advice about side effects. You may report side effects to FDA at 1-800-FDA-1088. Where should I keep my medicine? This drug is given in a hospital or clinic and will not be stored at home. NOTE: This sheet is a summary. It may not cover all  possible information. If you have questions about this medicine, talk to your doctor, pharmacist, or health care provider.  2020 Elsevier/Gold Standard (2007-04-20 14:38:05)  Coronavirus (COVID-19) Are you at risk?  Are you at risk for the Coronavirus (COVID-19)?  To be considered HIGH RISK for Coronavirus (COVID-19), you have to meet the following criteria:  . Traveled to [place], [place], [place], [place] or  [place]; or in the [place] to [place], [place], [place], or [place]; and have fever, cough, and shortness of breath within the last 2 weeks of travel OR . Been in close contact with a person diagnosed with COVID-19 within the last 2 weeks and have fever, cough, and shortness of breath . IF YOU DO NOT MEET THESE CRITERIA, YOU ARE CONSIDERED LOW RISK FOR COVID-19.  What to do if you are HIGH RISK for COVID-19?  Marland Kitchen If you are having a medical emergency, call 911. . Seek medical care right away. Before you go to a doctor's office, urgent care or emergency department, call ahead and tell them about your recent travel, contact with someone diagnosed with COVID-19, and your symptoms. You should receive instructions from your physician's office regarding next steps of care.  . When you arrive at healthcare provider, tell the healthcare staff immediately you have returned from visiting [place], [place], [place], [place] or [place]; or traveled in the [place] to [place], [place], [place], or [place]; in the last two weeks or you have been in close contact with a person diagnosed with COVID-19 in the last 2 weeks.   . Tell the health care staff about your symptoms: fever, cough and shortness of breath. . After you have been seen by a medical provider, you will be either: o Tested for (COVID-19) and discharged home on quarantine except to seek medical care if symptoms worsen, and asked to  - Stay home and avoid contact with others until you get your results (4-5 days)  - Avoid  travel on public transportation if possible (such as bus, train, or airplane) or o Sent to the Emergency Department by EMS for evaluation, COVID-19 testing, and possible admission depending on your condition and test results.  What to do if you are LOW RISK for COVID-19?  Reduce your risk of any infection by using the same precautions used for avoiding the common cold or flu:  Marland Kitchen Wash your hands often with soap and warm water for at least 20 seconds.  If soap and water are not readily available, use an alcohol-based hand sanitizer with at least 60% alcohol.  . If coughing or sneezing, cover your mouth and nose by coughing or sneezing into the elbow areas of your shirt or coat, into a tissue or into your sleeve (not your hands). . Avoid shaking hands with others and consider head nods or verbal greetings only. . Avoid touching your eyes, nose, or mouth with unwashed hands.  . Avoid close contact with people who are sick. . Avoid places or events with large numbers of people in one location, like concerts or sporting events. . Carefully consider travel plans you have or are making. . If you are planning any travel outside or inside the [place], visit the CDC's Travelers' Health webpage for the latest health notices. . If you have some symptoms but not all symptoms, continue to monitor at home and seek medical attention if your symptoms worsen. . If you are having a medical emergency, call 911.   [place] / e-Visit: [email or web address]         [facility]: [facility]: [phone]                   [facility]: [phone]

## 2019-02-11 NOTE — Progress Notes (Signed)
If lab results are okay for tx today then per Dr Julien Nordmann it is okay for  AVASTIN today  with port procedure on 02/18/19 .

## 2019-02-11 NOTE — Progress Notes (Signed)
54 year old female diagnosed with non-small cell lung cancer.  She will be receiving chemotherapy.  She is a patient of Dr. Julien Nordmann.  Past medical history includes anemia, constipation, Crohn's disease, GERD, hypertension.  Medications include Decadron, Folvite, Lasix, Imodium, magnesium oxide, Protonix, Compazine, and Senokot.  Labs include glucose 113, creatinine 1.12, and albumin 3.2.  Height: 66 inches. Weight: 213 pounds on January 15. Patient weighed 243 pounds in September. BMI: 34.38.  Patient reports she has been sleeping a lot therefore she is not eating as much. She does endorse significant weight loss of 12% body weight over 4 months. Denies nausea but reports some vomiting. She does have constipation. Patient has ordered boost breeze.  Nutrition diagnosis: Unintended weight loss related to lung cancer and associated treatments as evidenced by no prior need for nutrition related information.  Intervention: Educated patient to increase calories and protein in small frequent meals and snacks. Reviewed high-protein foods. Educated patient on strategies for improving constipation. Encourage boost breeze as tolerated. Provided fact sheets and contact information.  Answered questions.  Monitoring, evaluation, goals: Patient will tolerate increased calories and protein to minimize weight loss.  Next visit: To be scheduled as needed.  **Disclaimer: This note was dictated with voice recognition software. Similar sounding words can inadvertently be transcribed and this note may contain transcription errors which may not have been corrected upon publication of note.**

## 2019-02-14 ENCOUNTER — Other Ambulatory Visit: Payer: Self-pay | Admitting: Internal Medicine

## 2019-02-14 ENCOUNTER — Telehealth: Payer: Self-pay | Admitting: *Deleted

## 2019-02-14 DIAGNOSIS — C3490 Malignant neoplasm of unspecified part of unspecified bronchus or lung: Secondary | ICD-10-CM

## 2019-02-14 NOTE — Telephone Encounter (Signed)
Called pt to see how she did with her chemotherapy treatment last week.  She reports doing ok except some nausea which she has had.  She is taking her nausea med with some relief.  She has has some constipation but took Miralax & drank prune juice & bowels have moved not.  She mentions some cough/congestions but she feels that it is from the nausea/vomiting.  She knows to call if symptoms worsen.

## 2019-02-14 NOTE — Telephone Encounter (Signed)
-----   Message from Zola Button, RN sent at 02/11/2019  3:59 PM EST ----- Regarding: Dr. Julien Nordmann; First Time F/U Call Patient received first time Bevacizumab, Alimta, and Carboplatin. Tolerated well. Is sched for [facility] placement on 02/18/19. Thank you!

## 2019-02-15 ENCOUNTER — Telehealth: Payer: Self-pay | Admitting: *Deleted

## 2019-02-15 NOTE — Telephone Encounter (Signed)
Oncology Nurse Navigator Documentation  Oncology Nurse Navigator Flowsheets 02/15/2019  Abnormal Finding Date -  Confirmed Diagnosis Date -  Navigator Location [facility]  Referral Date to RadOnc/MedOnc -  Navigator Encounter Type Telephone/I received a call from Ms. Austad.  She was updating me on how her first chemo went.  She also wanted to acknowledge a nurse on how nice she was.  I called Ms. Deshler to thank her for the update and asked for clarification on nurses name.  I left vm message to call me back.   Telephone Incoming Call;Outgoing Call  Treatment Initiated Date -  Patient Visit Type -  Treatment Phase Treatment  Barriers/Navigation Needs -  Education -  Interventions Psycho-Social Support  Acuity Level 2-Minimal Needs (1-2 Barriers Identified)  Referrals -  Coordination of Care -  Education Method -  Time Spent with Patient 15

## 2019-02-16 ENCOUNTER — Other Ambulatory Visit (HOSPITAL_COMMUNITY): Payer: 59

## 2019-02-16 ENCOUNTER — Inpatient Hospital Stay (HOSPITAL_COMMUNITY): Admission: RE | Admit: 2019-02-16 | Payer: 59 | Source: Ambulatory Visit

## 2019-02-17 ENCOUNTER — Inpatient Hospital Stay (HOSPITAL_BASED_OUTPATIENT_CLINIC_OR_DEPARTMENT_OTHER): Payer: 59 | Admitting: Internal Medicine

## 2019-02-17 ENCOUNTER — Encounter: Payer: Self-pay | Admitting: Internal Medicine

## 2019-02-17 ENCOUNTER — Other Ambulatory Visit: Payer: Self-pay

## 2019-02-17 ENCOUNTER — Inpatient Hospital Stay: Payer: 59

## 2019-02-17 ENCOUNTER — Other Ambulatory Visit: Payer: Self-pay | Admitting: Medical Oncology

## 2019-02-17 VITALS — BP 114/71 | HR 96 | Temp 98.3°F | Resp 15 | Ht 66.0 in | Wt 214.5 lb

## 2019-02-17 DIAGNOSIS — I10 Essential (primary) hypertension: Secondary | ICD-10-CM

## 2019-02-17 DIAGNOSIS — C3490 Malignant neoplasm of unspecified part of unspecified bronchus or lung: Secondary | ICD-10-CM | POA: Diagnosis not present

## 2019-02-17 DIAGNOSIS — Z5111 Encounter for antineoplastic chemotherapy: Secondary | ICD-10-CM | POA: Diagnosis not present

## 2019-02-17 DIAGNOSIS — C7931 Secondary malignant neoplasm of brain: Secondary | ICD-10-CM

## 2019-02-17 DIAGNOSIS — C3492 Malignant neoplasm of unspecified part of left bronchus or lung: Secondary | ICD-10-CM

## 2019-02-17 DIAGNOSIS — Z95828 Presence of other vascular implants and grafts: Secondary | ICD-10-CM

## 2019-02-17 DIAGNOSIS — Z5112 Encounter for antineoplastic immunotherapy: Secondary | ICD-10-CM | POA: Diagnosis not present

## 2019-02-17 LAB — CBC WITH DIFFERENTIAL (CANCER CENTER ONLY)
Abs Immature Granulocytes: 0.05 10*3/uL (ref 0.00–0.07)
Basophils Absolute: 0 10*3/uL (ref 0.0–0.1)
Basophils Relative: 0 %
Eosinophils Absolute: 0.2 10*3/uL (ref 0.0–0.5)
Eosinophils Relative: 2 %
HCT: 38.5 % (ref 36.0–46.0)
Hemoglobin: 11.8 g/dL — ABNORMAL LOW (ref 12.0–15.0)
Immature Granulocytes: 1 %
Lymphocytes Relative: 12 %
Lymphs Abs: 1.2 10*3/uL (ref 0.7–4.0)
MCH: 27.3 pg (ref 26.0–34.0)
MCHC: 30.6 g/dL (ref 30.0–36.0)
MCV: 89.1 fL (ref 80.0–100.0)
Monocytes Absolute: 0.3 10*3/uL (ref 0.1–1.0)
Monocytes Relative: 4 %
Neutro Abs: 8 10*3/uL — ABNORMAL HIGH (ref 1.7–7.7)
Neutrophils Relative %: 81 %
Platelet Count: 203 10*3/uL (ref 150–400)
RBC: 4.32 MIL/uL (ref 3.87–5.11)
RDW: 15.5 % (ref 11.5–15.5)
WBC Count: 9.8 10*3/uL (ref 4.0–10.5)
nRBC: 0 % (ref 0.0–0.2)

## 2019-02-17 LAB — CMP (CANCER CENTER ONLY)
ALT: 11 U/L (ref 0–44)
AST: 15 U/L (ref 15–41)
Albumin: 3.2 g/dL — ABNORMAL LOW (ref 3.5–5.0)
Alkaline Phosphatase: 79 U/L (ref 38–126)
Anion gap: 9 (ref 5–15)
BUN: 12 mg/dL (ref 6–20)
CO2: 29 mmol/L (ref 22–32)
Calcium: 9.1 mg/dL (ref 8.9–10.3)
Chloride: 99 mmol/L (ref 98–111)
Creatinine: 0.88 mg/dL (ref 0.44–1.00)
GFR, Est AFR Am: >60 mL/min (ref >60)
GFR, Estimated: >60 mL/min (ref >60)
Glucose, Bld: 112 mg/dL — ABNORMAL HIGH (ref 70–99)
Potassium: 4.7 mmol/L (ref 3.5–5.1)
Sodium: 137 mmol/L (ref 135–145)
Total Bilirubin: 0.6 mg/dL (ref 0.3–1.2)
Total Protein: 6.6 g/dL (ref 6.5–8.1)

## 2019-02-17 MED ORDER — LIDOCAINE-PRILOCAINE 2.5-2.5 % EX CREA: 1.0000 "application " | TOPICAL_CREAM | CUTANEOUS | 0 refills | Status: DC | PRN

## 2019-02-17 MED ORDER — ONDANSETRON HCL 8 MG PO TABS: 8.0000 mg | ORAL_TABLET | Freq: Three times a day (TID) | ORAL | 0 refills | Status: DC | PRN

## 2019-02-17 MED FILL — LIDOCAINE-PRILOCAINE CREAM: 2.5-2.5 | 30 days supply | Qty: 30 | Fill #0

## 2019-02-17 MED FILL — ONDANSETRON HCL 8 MG TABLET: 8 | 6 days supply | Qty: 20 | Fill #0

## 2019-02-17 NOTE — Addendum Note (Signed)
Addended by: Ardeen Garland on: 02/17/2019 12:46 PM   Modules accepted: Orders

## 2019-02-17 NOTE — Pre-Procedure Instructions (Signed)
[place], [place] - [address] [place] [address] Phone: [phone] Fax: [phone]  [place], [place] - [address] [place] [address] Phone: [phone] Fax: [place], [place] - [address] [place] [address] Phone: [phone] Fax: [phone]      Your procedure is scheduled on Monday, January 25th.  Report to [facility] Main Entrance "A" at 5:30 A.M., and check in at the Admitting office.  Call this number if you have problems the morning of surgery:  [phone]  Call [phone] if you have any questions prior to your surgery date Monday-Friday 8am-4pm    Remember:  Do not eat or drink after midnight the night before your surgery    Take these medicines the morning of surgery with A SIP OF WATER  pantoprazole (PROTONIX) prochlorperazine (COMPAZINE)  dexamethasone (DECADRON)  Albuterol Inhaler-as needed; please bring with you to the hospital.  fluticasone (FLONASE)-as needed loperamide (IMODIUM)-as needed methocarbamol (ROBAXIN)-as needed Oxycodone HCl -as needed  prochlorperazine (COMPAZINE)-as needed   As of today, STOP taking any Aspirin (unless otherwise instructed by your surgeon), Aleve, Naproxen, Ibuprofen, Motrin, Advil, Goody's, BC's, all herbal medications, fish oil, and all vitamins.    The Morning of Surgery  Do not wear jewelry, make-up or nail polish.  Do not wear lotions, powders, or perfumes, or deodorant  Do not shave 48 hours prior to surgery.    Do not bring valuables to the hospital.  [facility] is not responsible for any belongings or valuables.  If you are a smoker, DO NOT Smoke 24 hours prior to surgery  If you wear a CPAP at night please bring your mask the morning of surgery   Remember that you must have someone to transport you home after your  surgery, and remain with you for 24 hours if you are discharged the same day.   Please bring cases for contacts, glasses, hearing aids, dentures or bridgework because it cannot be worn into surgery.    Leave your suitcase in the car.  After surgery it may be brought to your room.  For patients admitted to the hospital, discharge time will be determined by your treatment team.  Patients discharged the day of surgery will not be allowed to drive home.    Special instructions:   [facility]- Preparing For Surgery  Before surgery, you can play an important role. Because skin is not sterile, your skin needs to be as free of germs as possible. You can reduce the number of germs on your skin by washing with CHG (chlorahexidine gluconate) Soap before surgery.  CHG is an antiseptic cleaner which kills germs and bonds with the skin to continue killing germs even after washing.    Oral Hygiene is also important to reduce your risk of infection.  Remember - BRUSH YOUR TEETH THE MORNING OF SURGERY WITH YOUR REGULAR TOOTHPASTE  Please do not use if you have an allergy to CHG or antibacterial soaps. If your skin becomes reddened/irritated stop using the CHG.  Do not shave (including legs and underarms) for at least 48 hours prior to first CHG shower. It is OK to shave your face.  Please follow these instructions carefully.   1. Shower the NIGHT BEFORE SURGERY and the MORNING OF SURGERY with CHG Soap.   2. If you  chose to wash your hair, wash your hair first as usual with your normal shampoo.  3. After you shampoo, rinse your hair and body thoroughly to remove the shampoo.  4. Use CHG as you would any other liquid soap. You can apply CHG directly to the skin and wash gently with a scrungie or a clean washcloth.   5. Apply the CHG Soap to your body ONLY FROM THE NECK DOWN.  Do not use on open wounds or open sores. Avoid contact with your eyes, ears, mouth and genitals (private parts). Wash Face and  genitals (private parts)  with your normal soap.   6. Wash thoroughly, paying special attention to the area where your surgery will be performed.  7. Thoroughly rinse your body with warm water from the neck down.  8. DO NOT shower/wash with your normal soap after using and rinsing off the CHG Soap.  9. Pat yourself dry with a CLEAN TOWEL.  10. Wear CLEAN PAJAMAS to bed the night before surgery, wear comfortable clothes the morning of surgery  11. Place CLEAN SHEETS on your bed the night of your first shower and DO NOT SLEEP WITH PETS.    Day of Surgery:  Please shower the morning of surgery with the CHG soap Do not apply any deodorants/lotions. Please wear clean clothes to the hospital/surgery center.   Remember to brush your teeth WITH YOUR REGULAR TOOTHPASTE.   Please read over the following fact sheets that you were given.

## 2019-02-17 NOTE — Progress Notes (Signed)
Nancy Moore:(336) [phone]   [phone]  OFFICE PROGRESS NOTE  Copland, Gay Filler, MD [address] [place] [address]  DIAGNOSIS: DIAGNOSIS: stage IV (T3, N2, M1 a) non-small cell lung cancer, poorly differentiated adenocarcinoma with extensive miliary distribution in the lungs bilaterally diagnosed in September 2018. 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 NEGATIVE for the Exon 20 T790M mutation. Repeat molecular studies by guardant 360 at time of progression showed ATM [id number] [facility] of BRAF mutation, V600E  PRIOR THERAPY:  1) Gilotrif 40 mg daily started 11/08/2016.Status post 13 months of treatment. 2) Tagrisso 80 mg p.o. daily.  First dose started January 01, 2018.  Status post 12 months of treatment.   CURRENT THERAPY: 1) systemic chemotherapy with carboplatin for AUC 5, Alimta 500 mg/M2 and Avastin 15 mg/KG every 3 weeks.  First dose February 10, 2018.  Status post 1 cycle.  INTERVAL HISTORY: Nancy Moore 54y.o. female returns to the clinic today for follow-up visit.  The patient is feeling fine today with no concerning complaints except for mild fatigue as well as recurrent nausea.  She is using Compazine with some mild improvement but not complete resolution.  She denied having any current chest pain but has shortness of breath with exertion with mild cough and no hemoptysis.  She denied having any fever or chills.  She has no diarrhea or constipation.  She has no headache or visual changes.  She tolerated the first week of her systemic chemotherapy fairly well except for the fatigue and nausea.  She is here today for evaluation and repeat blood work.   MEDICAL HISTORY: Past Medical History:  Diagnosis Date  . [place]  . Anemia   . Arthritis   . Constipation   . [facility]'s  disease ([facility]    in remission 35 years +  . Dyspnea    gets "winded"  . GERD (gastroesophageal reflux disease)   . Goals of care, counseling/discussion 10/28/2016  . Hypertension   . Pneumonia    walking pneumonia in the late 80's  . Recurrent pleural effusion on left     ALLERGIES:  is allergic to percocet [oxycodone-acetaminophen]; lisinopril; and losartan.  MEDICATIONS:  Current Outpatient Medications  Medication Sig Dispense Refill  . albuterol (VENTOLIN HFA) 108 (90 Base) MCG/ACT inhaler Inhale 2 puffs into the lungs every 6 (six) hours as needed for wheezing or shortness of breath. 8 g 1  . clindamycin (CLINDAGEL) 1 % gel Apply 1 application topically 2 (two) times daily. 30 g 1  . dexamethasone (DECADRON) 4 MG tablet 1 tablet p.o. twice daily the day before, day of and day after chemotherapy every 3 weeks 40 tablet 1  . fluticasone (FLONASE) 50 MCG/ACT nasal spray INSTILL 2 SPRAYS INTO BOTH NOSTRILS DAILY (Patient taking differently: Place 2 sprays into both nostrils daily as needed for allergies. ) 16 g 0  . folic acid (FOLVITE) 1 MG tablet Take 1 tablet (1 mg total) by mouth daily. 30 tablet 4  . furosemide (LASIX) 40 MG tablet TAKE 1 TABLET BY MOUTH ONCE DAILY (Patient taking differently: Take 40 mg by mouth daily. ) 90 tablet 1  . HYDROcodone-homatropine (HYCODAN) 5-1.5 MG/5ML syrup Take 5 mLs by mouth every 6 (six) hours as needed for cough. (Patient not taking: Reported on 02/14/2019) 240 mL 0  .  loperamide (IMODIUM) 1 MG/5ML solution Take 2 mg as needed by mouth for diarrhea or loose stools.    . magnesium oxide (MAG-OX) 400 (241.3 Mg) MG tablet Take 1 tablet (400 mg total) by mouth daily.    . methocarbamol (ROBAXIN) 500 MG tablet TAKE 1 TABLET (500 MG TOTAL) BY MOUTH EVERY 8 (EIGHT) HOURS AS NEEDED FOR MUSCLE SPASMS. 30 tablet 5  . nystatin cream (MYCOSTATIN) Apply 1 application topically 2 (two) times daily. (Patient taking differently: Apply 1 application topically daily as  needed for dry skin. ) 30 g 0  . Oxycodone HCl 10 MG TABS TAKE 1 TABLET BY MOUTH 3 TIMES DAILY AS NEEDED (Patient taking differently: Take 10 mg by mouth 3 (three) times daily as needed (pain). ) 60 tablet 0  . pantoprazole (PROTONIX) 40 MG tablet TAKE 1 TABLET (40 MG TOTAL) BY MOUTH DAILY AT 12 NOON. (Patient taking differently: Take 40 mg by mouth daily. ) 90 tablet 3  . potassium chloride SA (K-DUR) 20 MEQ tablet TAKE 1 TABLET (20 MEQ TOTAL) BY MOUTH 2 TIMES DAILY. (Patient taking differently: Take 20 mEq by mouth 2 (two) times daily. ) 180 tablet 3  . prochlorperazine (COMPAZINE) 10 MG tablet Take 1 tablet (10 mg total) by mouth every 6 (six) hours as needed for nausea or vomiting. 30 tablet 0  . senna-docusate (SENOKOT-S) 8.6-50 MG tablet Take 1 tablet by mouth at bedtime.     No current facility-administered medications for this visit.    SURGICAL HISTORY:  Past Surgical History:  Procedure Laterality Date  . ABDOMINAL HYSTERECTOMY     partial hysterectomy  . BREAST EXCISIONAL BIOPSY Left 20+ yrs ago   benign  . CHEST TUBE INSERTION Left 10/26/2018   Procedure: INSERTION PLEURAL DRAINAGE CATHETER;  Surgeon: Ivin Poot, MD;  Location: [facility];  Service: Thoracic;  Laterality: Left;  . COLONOSCOPY    . PERICARDIAL WINDOW N/A 01/10/2017   Procedure: PERICARDIAL WINDOW- SUB XYPHOID, RIGHT CHEST TUBE;  Surgeon: Ivin Poot, MD;  Location: [facility];  Service: Thoracic;  Laterality: N/A;  . VIDEO BRONCHOSCOPY Bilateral 10/21/2016   Procedure: VIDEO BRONCHOSCOPY WITH FLUORO;  Surgeon: Tanda Rockers, MD;  Location: [facility];  Service: Cardiopulmonary;  Laterality: Bilateral;    REVIEW OF SYSTEMS:  Constitutional: positive for fatigue and weight loss Eyes: negative Ears, nose, mouth, throat, and face: negative Respiratory: positive for cough and dyspnea on exertion Cardiovascular: negative Gastrointestinal: positive for nausea Genitourinary:negative Integument/breast:  negative Hematologic/lymphatic: negative Musculoskeletal:positive for back pain Neurological: negative Behavioral/Psych: negative Endocrine: negative Allergic/Immunologic: negative   PHYSICAL EXAMINATION: General appearance: alert, cooperative, fatigued and no distress Head: Normocephalic, without obvious abnormality, atraumatic Neck: no adenopathy, no JVD, supple, symmetrical, trachea midline and thyroid not enlarged, symmetric, no tenderness/mass/nodules Lymph nodes: Cervical, supraclavicular, and axillary nodes normal. Resp: clear to auscultation bilaterally Back: symmetric, no curvature. ROM normal. No CVA tenderness. Cardio: regular rate and rhythm, S1, S2 normal, no murmur, click, rub or gallop GI: soft, non-tender; bowel sounds normal; no masses,  no organomegaly Extremities: extremities normal, atraumatic, no cyanosis or edema Neurologic: Alert and oriented X 3, normal strength and tone. Normal symmetric reflexes. Normal coordination and gait  ECOG PERFORMANCE STATUS: 1 - Symptomatic but completely ambulatory  Blood pressure 114/71, pulse 96, temperature 98.3 F (36.8 C), temperature source Temporal, resp. rate 15, height 5' 6"  (1.676 m), weight 214 lb 8 oz (97.3 kg), last menstrual period 03/15/2010, SpO2 97 %.  LABORATORY DATA: Lab Results  Component  Value Date   WBC 9.8 02/17/2019   HGB 11.8 (L) 02/17/2019   HCT 38.5 02/17/2019   MCV 89.1 02/17/2019   PLT 203 02/17/2019      Chemistry      Component Value Date/Time   NA 142 02/08/2019 1306   NA 135 (L) 01/08/2017 1055   K 4.1 02/08/2019 1306   K 3.2 (L) 01/08/2017 1055   CL 102 02/08/2019 1306   CO2 29 02/08/2019 1306   CO2 25 01/08/2017 1055   BUN 12 02/08/2019 1306   BUN 14.6 01/08/2017 1055   CREATININE 1.10 (H) 02/08/2019 1306   CREATININE 1.3 (H) 01/08/2017 1055      Component Value Date/Time   CALCIUM 9.0 02/08/2019 1306   CALCIUM 9.5 01/08/2017 1055   ALKPHOS 97 02/08/2019 1306   ALKPHOS 82  01/08/2017 1055   AST 13 (L) 02/08/2019 1306   AST 31 01/08/2017 1055   ALT 10 02/08/2019 1306   ALT 34 01/08/2017 1055   BILITOT 0.3 02/08/2019 1306   BILITOT 1.74 (H) 01/08/2017 1055       RADIOGRAPHIC STUDIES: No results found.  ASSESSMENT AND PLAN: This is a very pleasant 54 years old African-American female with metastatic non-small cell lung cancer, adenocarcinoma and positive for EGFR mutation with deletion in exon 19.  The patient is currently on treatment with GILOTRIF 40 mg p.o. daily status post 13 months.  The patient is tolerating this treatment well except for few episodes of diarrhea and mild skin rash. She is complaining of dizzy spells and vertigo recently. Repeat CT scan of the chest, abdomen and pelvis performed recently showed some evidence for disease progression with increase in size and number of pulmonary nodules. Her treatment was switched to Tagrisso 80 mg p.o. daily started January 01, 2018.  She is status post 12 months of treatment The patient has been tolerating this treatment well with no concerning adverse effects. She had repeat MRI of the brain as well as CT scan of the chest, abdomen pelvis performed recently. Her MRI of the brain showed no concerning findings for disease progression in the brain but CT scan of the chest showed some evidence for progression with lymphangitic spread of the disease. She was referred to Dr. Durenda Hurt at [facility] and repeat molecular studies showed the emergence of BRAF mutation V600E. I recommended for the patient to consider systemic chemotherapy and she is here today for evaluation and discussion of this option.  I had a lengthy discussion with the patient today regarding her condition.  She understands that a combination of BRAF inhibitor and Tagrisso has not been well studied at this point. She is currently undergoing systemic chemotherapy with carboplatin for AUC of 5, Alimta 500 mg/M2 and Avastin 15 mg/KG every 3  weeks, status post 1 cycle.  She also continues her current treatment with Tagrisso 80 mg p.o. daily. She tolerated the first cycle of her systemic chemotherapy fairly well except for intermittent nausea and fatigue. I recommended for the patient to continue her current treatment as planned and she will come back for follow-up visit in 2 weeks for evaluation before starting cycle #2. For the nausea, I will give the patient prescription for Zofran 8 mg p.o. every 8 hours as needed.  She will also alternate with Compazine if needed. For the weight loss, the patient was encouraged to increase her nutritional supplements. For the pain she will continue her current treatment with oxycodone as needed. The patient was advised to  call immediately if she has any concerning symptoms in the interval. The patient voices understanding of current disease status and treatment options and is in agreement with the current care plan. All questions were answered. The patient knows to call the clinic with any problems, questions or concerns. We can certainly see the patient much sooner if necessary.  Disclaimer: This note was dictated with voice recognition software. Similar sounding words can inadvertently be transcribed and may not be corrected upon review.

## 2019-02-18 ENCOUNTER — Other Ambulatory Visit (HOSPITAL_COMMUNITY)
Admission: RE | Admit: 2019-02-18 | Discharge: 2019-02-18 | Disposition: A | Discharge status: Home / Self Care | Payer: 59 | Source: Ambulatory Visit | Attending: Cardiothoracic Surgery | Admitting: Cardiothoracic Surgery

## 2019-02-18 ENCOUNTER — Encounter (HOSPITAL_COMMUNITY): Payer: Self-pay

## 2019-02-18 ENCOUNTER — Encounter (HOSPITAL_COMMUNITY)
Admission: RE | Admit: 2019-02-18 | Discharge: 2019-02-18 | Disposition: A | Discharge status: Home / Self Care | Payer: 59 | Source: Ambulatory Visit | Attending: Cardiothoracic Surgery | Admitting: Cardiothoracic Surgery

## 2019-02-18 ENCOUNTER — Other Ambulatory Visit: Payer: Self-pay

## 2019-02-18 ENCOUNTER — Ambulatory Visit (HOSPITAL_COMMUNITY)
Admission: RE | Admit: 2019-02-18 | Discharge: 2019-02-18 | Disposition: A | Discharge status: Home / Self Care | Payer: 59 | Source: Ambulatory Visit | Attending: Cardiothoracic Surgery | Admitting: Cardiothoracic Surgery

## 2019-02-18 DIAGNOSIS — C3490 Malignant neoplasm of unspecified part of unspecified bronchus or lung: Secondary | ICD-10-CM

## 2019-02-18 DIAGNOSIS — Z20822 Contact with and (suspected) exposure to covid-19: Secondary | ICD-10-CM | POA: Diagnosis not present

## 2019-02-18 DIAGNOSIS — Z01818 Encounter for other preprocedural examination: Secondary | ICD-10-CM | POA: Insufficient documentation

## 2019-02-18 DIAGNOSIS — J90 Pleural effusion, not elsewhere classified: Secondary | ICD-10-CM | POA: Diagnosis not present

## 2019-02-18 HISTORY — DX: Personal history of other diseases of the digestive system: Z87.19

## 2019-02-18 LAB — SARS CORONAVIRUS 2 (TAT 6-24 HRS): SARS Coronavirus 2: NEGATIVE

## 2019-02-18 NOTE — Progress Notes (Signed)
PCP - Lamar Blinks, MD Cardiologist - Ivin Poot, MD  Chest x-ray - 02/18/19 EKG - 10/14/18 Stress Test - N/A ECHO - 01/10/17 Cardiac Cath - N/A  Blood Thinner Instructions: N/A Aspirin Instructions: N/A  COVID TEST- pending result from today, 02/18/19  Coronavirus Screening  Have you experienced the following symptoms:  Cough yes/no: No Fever (>100.25F)  yes/no: No Runny nose yes/no: No Sore throat yes/no: No Difficulty breathing/shortness of breath  yes/no: No  Have you or a family member traveled in the last 14 days and where? yes/no: No   If the patient indicates "YES" to the above questions, their PAT will be rescheduled to limit the exposure to others and, the surgeon will be notified. THE PATIENT WILL NEED TO BE ASYMPTOMATIC FOR 14 DAYS.   If the patient is not experiencing any of these symptoms, the PAT nurse will instruct them to NOT bring anyone with them to their appointment since they may have these symptoms or traveled as well.   Please remind your patients and families that hospital visitation restrictions are in effect and the importance of the restrictions.   Anesthesia review: Yes; current chemotherapy  Patient denies shortness of breath, fever, cough and chest pain at PAT appointment  All instructions explained to the patient, with a verbal understanding of the material. Patient agrees to go over the instructions while at home for a better understanding. Patient also instructed to self quarantine after being tested for COVID-19. The opportunity to ask questions was provided.

## 2019-02-18 NOTE — Progress Notes (Signed)
Unable to obtain PT/INR and aPTT at PAT appointment after multiple attempts.  After speaking with Levonne Spiller, RN, reordered labs to be drawn on DOS.  Instructed patient to hydrate well over the weekend.

## 2019-02-20 NOTE — Anesthesia Preprocedure Evaluation (Addendum)
Anesthesia Evaluation  Patient identified by MRN, date of birth, ID band Patient awake    Reviewed: Allergy & Precautions, NPO status , Patient's Chart, lab work & pertinent test results  History of Anesthesia Complications Negative for: history of anesthetic complications  Airway Mallampati: II  TM Distance: >3 FB Neck ROM: Full    Dental  (+) Dental Advisory Given, Teeth Intact   Pulmonary neg pulmonary ROS,   Lung cancer    Pulmonary exam normal        Cardiovascular hypertension, Normal cardiovascular exam     Neuro/Psych  Brain metastases  negative psych ROS   GI/Hepatic Neg liver ROS, hiatal hernia, GERD  Medicated and Poorly Controlled, Crohn's disease    Endo/Other   Obesity   Renal/GU negative Renal ROS     Musculoskeletal  (+) Arthritis ,   Abdominal   Peds  Hematology negative hematology ROS (+)   Anesthesia Other Findings Covid neg 1/22   Reproductive/Obstetrics                            Anesthesia Physical Anesthesia Plan  ASA: III  Anesthesia Plan: General   Post-op Pain Management:    Induction: Intravenous, Rapid sequence and Cricoid pressure planned  PONV Risk Score and Plan: 3 and Treatment may vary due to age or medical condition, Ondansetron, Dexamethasone and Midazolam  Airway Management Planned: Oral ETT  Additional Equipment: None  Intra-op Plan:   Post-operative Plan: Extubation in OR  Informed Consent: I have reviewed the patients History and Physical, chart, labs and discussed the procedure including the risks, benefits and alternatives for the proposed anesthesia with the patient or authorized representative who has indicated his/her understanding and acceptance.     Dental advisory given  Plan Discussed with: CRNA and Anesthesiologist  Anesthesia Plan Comments:        Anesthesia Quick Evaluation

## 2019-02-21 ENCOUNTER — Telehealth: Payer: Self-pay | Admitting: Medical Oncology

## 2019-02-21 ENCOUNTER — Ambulatory Visit (HOSPITAL_COMMUNITY): Payer: 59

## 2019-02-21 ENCOUNTER — Encounter (HOSPITAL_COMMUNITY): Payer: Self-pay | Admitting: Cardiothoracic Surgery

## 2019-02-21 ENCOUNTER — Ambulatory Visit (HOSPITAL_COMMUNITY): Payer: 59 | Admitting: Physician Assistant

## 2019-02-21 ENCOUNTER — Encounter (HOSPITAL_COMMUNITY)
Admission: RE | Disposition: A | Discharge status: Home / Self Care | Payer: Self-pay | Source: Home / Self Care | Attending: Cardiothoracic Surgery

## 2019-02-21 ENCOUNTER — Other Ambulatory Visit: Payer: Self-pay

## 2019-02-21 ENCOUNTER — Ambulatory Visit (HOSPITAL_COMMUNITY)
Admission: RE | Admit: 2019-02-21 | Discharge: 2019-02-21 | Disposition: A | Discharge status: Home / Self Care | Payer: 59 | Attending: Cardiothoracic Surgery | Admitting: Cardiothoracic Surgery

## 2019-02-21 DIAGNOSIS — Z419 Encounter for procedure for purposes other than remedying health state, unspecified: Secondary | ICD-10-CM

## 2019-02-21 DIAGNOSIS — Z79899 Other long term (current) drug therapy: Secondary | ICD-10-CM | POA: Insufficient documentation

## 2019-02-21 DIAGNOSIS — C3492 Malignant neoplasm of unspecified part of left bronchus or lung: Secondary | ICD-10-CM | POA: Insufficient documentation

## 2019-02-21 DIAGNOSIS — C3490 Malignant neoplasm of unspecified part of unspecified bronchus or lung: Secondary | ICD-10-CM | POA: Diagnosis not present

## 2019-02-21 DIAGNOSIS — Z7952 Long term (current) use of systemic steroids: Secondary | ICD-10-CM | POA: Diagnosis not present

## 2019-02-21 HISTORY — PX: REMOVAL OF PLEURAL DRAINAGE CATHETER: SHX5080

## 2019-02-21 HISTORY — PX: PORTACATH PLACEMENT: SHX2246

## 2019-02-21 LAB — APTT: aPTT: 24 seconds (ref 24–36)

## 2019-02-21 LAB — PROTIME-INR
INR: 1.2 (ref 0.8–1.2)
Prothrombin Time: 14.7 seconds (ref 11.4–15.2)

## 2019-02-21 SURGERY — INSERTION, TUNNELED CENTRAL VENOUS DEVICE, WITH PORT
Anesthesia: Monitor Anesthesia Care | Site: Chest | Laterality: Left

## 2019-02-21 MED ORDER — PROPOFOL 10 MG/ML IV BOLUS
INTRAVENOUS | Status: AC
  Filled 2019-02-21: qty 40

## 2019-02-21 MED ORDER — PHENYLEPHRINE 40 MCG/ML (10ML) SYRINGE FOR IV PUSH (FOR BLOOD PRESSURE SUPPORT)
PREFILLED_SYRINGE | INTRAVENOUS | Status: AC
  Filled 2019-02-21: qty 10

## 2019-02-21 MED ORDER — LIDOCAINE 2% (20 MG/ML) 5 ML SYRINGE
INTRAMUSCULAR | Status: DC | PRN
  Administered 2019-02-21: 80 mg via INTRAVENOUS

## 2019-02-21 MED ORDER — SODIUM CHLORIDE 0.9% FLUSH: 3.0000 mL | INTRAVENOUS | Status: DC | PRN

## 2019-02-21 MED ORDER — LIDOCAINE 2% (20 MG/ML) 5 ML SYRINGE
INTRAMUSCULAR | Status: AC
  Filled 2019-02-21: qty 5

## 2019-02-21 MED ORDER — SODIUM CHLORIDE 0.9 % IV SOLN
INTRAVENOUS | Status: AC
  Filled 2019-02-21: qty 1.2

## 2019-02-21 MED ORDER — 0.9 % SODIUM CHLORIDE (POUR BTL) OPTIME
TOPICAL | Status: DC | PRN
  Administered 2019-02-21: 1000 mL

## 2019-02-21 MED ORDER — OXYCODONE HCL 5 MG PO TABS
ORAL_TABLET | ORAL | Status: AC
  Filled 2019-02-21: qty 2

## 2019-02-21 MED ORDER — MIDAZOLAM HCL 2 MG/2ML IJ SOLN
INTRAMUSCULAR | Status: AC
  Filled 2019-02-21: qty 2

## 2019-02-21 MED ORDER — HEPARIN SOD (PORK) LOCK FLUSH 100 UNIT/ML IV SOLN
INTRAVENOUS | Status: AC
  Filled 2019-02-21: qty 5

## 2019-02-21 MED ORDER — ONDANSETRON HCL 4 MG/2ML IJ SOLN
INTRAMUSCULAR | Status: AC
  Filled 2019-02-21: qty 2

## 2019-02-21 MED ORDER — DEXAMETHASONE SODIUM PHOSPHATE 10 MG/ML IJ SOLN
INTRAMUSCULAR | Status: DC | PRN
  Administered 2019-02-21: 10 mg via INTRAVENOUS

## 2019-02-21 MED ORDER — LIDOCAINE-PRILOCAINE 2.5-2.5 % EX CREA: 1.0000 "application " | TOPICAL_CREAM | CUTANEOUS | Status: DC | PRN

## 2019-02-21 MED ORDER — SODIUM CHLORIDE 0.9% FLUSH: 3.0000 mL | Freq: Two times a day (BID) | INTRAVENOUS | Status: DC

## 2019-02-21 MED ORDER — PHENYLEPHRINE 40 MCG/ML (10ML) SYRINGE FOR IV PUSH (FOR BLOOD PRESSURE SUPPORT)
PREFILLED_SYRINGE | INTRAVENOUS | Status: DC | PRN
  Administered 2019-02-21: 40 ug via INTRAVENOUS
  Administered 2019-02-21 (×3): 80 ug via INTRAVENOUS
  Administered 2019-02-21: 40 ug via INTRAVENOUS
  Administered 2019-02-21 (×3): 80 ug via INTRAVENOUS
  Administered 2019-02-21: 40 ug via INTRAVENOUS

## 2019-02-21 MED ORDER — EPHEDRINE 5 MG/ML INJ
INTRAVENOUS | Status: AC
  Filled 2019-02-21: qty 10

## 2019-02-21 MED ORDER — SUCCINYLCHOLINE CHLORIDE 200 MG/10ML IV SOSY
PREFILLED_SYRINGE | INTRAVENOUS | Status: AC
  Filled 2019-02-21: qty 10

## 2019-02-21 MED ORDER — LIDOCAINE HCL (PF) 1 % IJ SOLN
INTRAMUSCULAR | Status: AC
  Filled 2019-02-21: qty 30

## 2019-02-21 MED ORDER — LACTATED RINGERS IV SOLN: INTRAVENOUS | Status: DC | PRN

## 2019-02-21 MED ORDER — SODIUM CHLORIDE 0.9 % IV SOLN
INTRAVENOUS | Status: DC | PRN
  Administered 2019-02-21: 500 mL

## 2019-02-21 MED ORDER — SODIUM CHLORIDE 0.9 % IV SOLN: 250.0000 mL | INTRAVENOUS | Status: DC | PRN

## 2019-02-21 MED ORDER — CEFAZOLIN SODIUM-DEXTROSE 2-4 GM/100ML-% IV SOLN
2.0000 g | INTRAVENOUS | Status: AC
  Administered 2019-02-21: 2 g via INTRAVENOUS
  Filled 2019-02-21: qty 100

## 2019-02-21 MED ORDER — ONDANSETRON HCL 4 MG/2ML IJ SOLN
INTRAMUSCULAR | Status: DC | PRN
  Administered 2019-02-21: 4 mg via INTRAVENOUS

## 2019-02-21 MED ORDER — PROPOFOL 10 MG/ML IV BOLUS
INTRAVENOUS | Status: DC | PRN
  Administered 2019-02-21: 150 mg via INTRAVENOUS
  Administered 2019-02-21: 20 mg via INTRAVENOUS

## 2019-02-21 MED ORDER — SCOPOLAMINE 1 MG/3DAYS TD PT72
MEDICATED_PATCH | TRANSDERMAL | Status: AC
  Filled 2019-02-21: qty 1

## 2019-02-21 MED ORDER — HEPARIN SOD (PORK) LOCK FLUSH 100 UNIT/ML IV SOLN
INTRAVENOUS | Status: DC | PRN
  Administered 2019-02-21: 500 [IU] via INTRAVENOUS

## 2019-02-21 MED ORDER — KETOROLAC TROMETHAMINE 15 MG/ML IJ SOLN: 15.0000 mg | Freq: Four times a day (QID) | INTRAMUSCULAR | Status: DC

## 2019-02-21 MED ORDER — FENTANYL CITRATE (PF) 250 MCG/5ML IJ SOLN
INTRAMUSCULAR | Status: AC
  Filled 2019-02-21: qty 5

## 2019-02-21 MED ORDER — SUCCINYLCHOLINE CHLORIDE 20 MG/ML IJ SOLN
INTRAMUSCULAR | Status: DC | PRN
  Administered 2019-02-21: 140 mg via INTRAVENOUS

## 2019-02-21 MED ORDER — FENTANYL CITRATE (PF) 100 MCG/2ML IJ SOLN: 25.0000 ug | INTRAMUSCULAR | Status: DC | PRN

## 2019-02-21 MED ORDER — KETOROLAC TROMETHAMINE 30 MG/ML IJ SOLN
INTRAMUSCULAR | Status: AC
  Filled 2019-02-21: qty 1

## 2019-02-21 MED ORDER — PROPOFOL 500 MG/50ML IV EMUL
INTRAVENOUS | Status: DC | PRN
  Administered 2019-02-21: 25 ug/kg/min via INTRAVENOUS

## 2019-02-21 MED ORDER — OXYCODONE HCL 5 MG PO TABS
10.0000 mg | ORAL_TABLET | Freq: Once | ORAL | Status: AC
  Administered 2019-02-21: 10 mg via ORAL

## 2019-02-21 MED ORDER — LIDOCAINE HCL (PF) 1 % IJ SOLN
INTRAMUSCULAR | Status: DC | PRN
  Administered 2019-02-21: 14 mL

## 2019-02-21 MED ORDER — PROMETHAZINE HCL 25 MG/ML IJ SOLN: 6.2500 mg | INTRAMUSCULAR | Status: DC | PRN

## 2019-02-21 MED ORDER — FENTANYL CITRATE (PF) 250 MCG/5ML IJ SOLN
INTRAMUSCULAR | Status: DC | PRN
  Administered 2019-02-21: 100 ug via INTRAVENOUS
  Administered 2019-02-21: 50 ug via INTRAVENOUS

## 2019-02-21 MED ORDER — MIDAZOLAM HCL 5 MG/5ML IJ SOLN
INTRAMUSCULAR | Status: DC | PRN
  Administered 2019-02-21: 2 mg via INTRAVENOUS

## 2019-02-21 SURGICAL SUPPLY — 59 items
ADH SKN CLS APL DERMABOND .7 (GAUZE/BANDAGES/DRESSINGS) ×1
APL SKNCLS STERI-STRIP NONHPOA (GAUZE/BANDAGES/DRESSINGS)
BAG DECANTER FOR FLEXI CONT (MISCELLANEOUS) ×3 IMPLANT
BENZOIN TINCTURE PRP APPL 2/3 (GAUZE/BANDAGES/DRESSINGS) IMPLANT
BLADE 11 SAFETY STRL DISP (BLADE) ×2 IMPLANT
BLADE CLIPPER SURG (BLADE) ×1 IMPLANT
CANISTER SUCT 3000ML PPV (MISCELLANEOUS) ×3 IMPLANT
CLIP VESOCCLUDE SM WIDE 6/CT (CLIP) ×2 IMPLANT
CLOSURE WOUND 1/2 X4 (GAUZE/BANDAGES/DRESSINGS)
COVER SURGICAL LIGHT HANDLE (MISCELLANEOUS) ×4 IMPLANT
COVER TRANSDUCER CIVFLEX 5.5 (CAP) ×2 IMPLANT
DERMABOND ADVANCED (GAUZE/BANDAGES/DRESSINGS) ×2
DERMABOND ADVANCED .7 DNX12 (GAUZE/BANDAGES/DRESSINGS) IMPLANT
DRAPE C-ARM 42X72 X-RAY (DRAPES) ×3 IMPLANT
DRAPE CHEST BREAST 15X10 FENES (DRAPES) ×1 IMPLANT
DRAPE LAPAROTOMY TRNSV 102X78 (DRAPES) ×3 IMPLANT
DRSG TEGADERM 2-3/8X2-3/4 SM (GAUZE/BANDAGES/DRESSINGS) ×2 IMPLANT
DRSG TEGADERM 4X4.75 (GAUZE/BANDAGES/DRESSINGS) ×2 IMPLANT
ELECT CAUTERY BLADE 6.4 (BLADE) ×3 IMPLANT
ELECT REM PT RETURN 9FT ADLT (ELECTROSURGICAL) ×3
ELECTRODE REM PT RTRN 9FT ADLT (ELECTROSURGICAL) ×1 IMPLANT
GAUZE SPONGE 2X2 8PLY STRL LF (GAUZE/BANDAGES/DRESSINGS) IMPLANT
GAUZE SPONGE 4X4 12PLY STRL (GAUZE/BANDAGES/DRESSINGS) ×3 IMPLANT
GLOVE BIO SURGEON STRL SZ 6.5 (GLOVE) ×1 IMPLANT
GLOVE BIO SURGEON STRL SZ7.5 (GLOVE) ×6 IMPLANT
GLOVE BIO SURGEONS STRL SZ 6.5 (GLOVE) ×1
GLOVE BIOGEL PI IND STRL 6.5 (GLOVE) IMPLANT
GLOVE BIOGEL PI INDICATOR 6.5 (GLOVE) ×2
GOWN STRL REUS W/ TWL LRG LVL3 (GOWN DISPOSABLE) ×1 IMPLANT
GOWN STRL REUS W/TWL LRG LVL3 (GOWN DISPOSABLE) ×3
GUIDEWIRE UNCOATED ST S 7038 (WIRE) IMPLANT
INTRODUCER 13FR (INTRODUCER) IMPLANT
INTRODUCER COOK 11FR (CATHETERS) IMPLANT
KIT BASIN OR (CUSTOM PROCEDURE TRAY) ×3 IMPLANT
KIT PLEURX DRAIN CATH 1000ML (MISCELLANEOUS) ×2 IMPLANT
KIT PORT POWER 9.6FR MRI PREA (Stent) ×2 IMPLANT
KIT TURNOVER KIT B (KITS) ×3 IMPLANT
NDL 18GX1X1/2 (RX/OR ONLY) (NEEDLE) ×1 IMPLANT
NDL 25GX 5/8IN NON SAFETY (NEEDLE) ×1 IMPLANT
NEEDLE 18GX1X1/2 (RX/OR ONLY) (NEEDLE) ×3 IMPLANT
NEEDLE 22X1 1/2 (OR ONLY) (NEEDLE) ×3 IMPLANT
NEEDLE 25GX 5/8IN NON SAFETY (NEEDLE) ×3 IMPLANT
NS IRRIG 1000ML POUR BTL (IV SOLUTION) ×3 IMPLANT
PACK GENERAL/GYN (CUSTOM PROCEDURE TRAY) ×3 IMPLANT
PAD ARMBOARD 7.5X6 YLW CONV (MISCELLANEOUS) ×6 IMPLANT
SET SHEATH INTRODUCER 10FR (MISCELLANEOUS) ×2 IMPLANT
SPONGE GAUZE 2X2 STER 10/PKG (GAUZE/BANDAGES/DRESSINGS) ×2
STRIP CLOSURE SKIN 1/2X4 (GAUZE/BANDAGES/DRESSINGS) IMPLANT
SUT ETHILON 3 0 FSL (SUTURE) ×3 IMPLANT
SUT ETHILON 3 0 PS 1 (SUTURE) ×4 IMPLANT
SUT VIC AB 3-0 SH 8-18 (SUTURE) ×5 IMPLANT
SUT VIC AB 3-0 X1 27 (SUTURE) ×3 IMPLANT
SYR 10ML LL (SYRINGE) ×3 IMPLANT
SYR 20ML LL LF (SYRINGE) ×3 IMPLANT
SYR CONTROL 10ML LL (SYRINGE) ×2 IMPLANT
TOWEL GREEN STERILE (TOWEL DISPOSABLE) ×3 IMPLANT
TOWEL GREEN STERILE FF (TOWEL DISPOSABLE) ×3 IMPLANT
TRAY LAPAROSCOPIC MC (CUSTOM PROCEDURE TRAY) ×1 IMPLANT
WATER STERILE IRR 1000ML POUR (IV SOLUTION) ×3 IMPLANT

## 2019-02-21 NOTE — Progress Notes (Signed)
Pre Procedure note for inpatients:   Nancy Moore has been scheduled for Procedure(s): INSERTION PORT-A-CATH (Right) REMOVAL OF PLEURAL DRAINAGE CATHETER (Left) today. The various methods of treatment have been discussed with the patient. After consideration of the risks, benefits and treatment options the patient has consented to the planned procedure.   The patient has been seen and labs reviewed. There are no changes in the patient's condition to prevent proceeding with the planned procedure today.  Recent labs:  Lab Results  Component Value Date   WBC 9.8 02/17/2019   HGB 11.8 (L) 02/17/2019   HCT 38.5 02/17/2019   PLT 203 02/17/2019   GLUCOSE 112 (H) 02/17/2019   CHOL 210 (H) 03/26/2018   TRIG 67.0 03/26/2018   HDL 71.30 03/26/2018   LDLCALC 125 (H) 03/26/2018   ALT 11 02/17/2019   AST 15 02/17/2019   NA 137 02/17/2019   K 4.7 02/17/2019   CL 99 02/17/2019   CREATININE 0.88 02/17/2019   BUN 12 02/17/2019   CO2 29 02/17/2019   TSH 3.637 10/14/2018   INR 1.2 02/21/2019    Len Childs, MD 02/21/2019 7:34 AM

## 2019-02-21 NOTE — Discharge Instructions (Addendum)
Keep bandages dry for 3 days- take sponge baths start tomorrow No driving 48 hrs portacath ok to use for chemo office will call for appt to remove sutures

## 2019-02-21 NOTE — Op Note (Signed)
NAME: Nancy Moore, Nancy Moore MEDICAL RECORD ZT:[id number] ACCOUNT [id number] DATE OF BIRTH:March 21, 1965 FACILITY: [facility] LOCATION: [facility] PHYSICIAN:Durrell Barajas VAN TRIGT III, MD  OPERATIVE REPORT  DATE OF PROCEDURE:  02/21/2019  OPERATIONS: 1.  Placement of Port-A-Cath, left subclavian vein. 2.  Removal of left PleurX catheter.  SURGEON:  Ivin Poot, MD  ANESTHESIA:  General.  PREOPERATIVE DIAGNOSIS:  Advanced age nonsmall cell carcinoma of the lung with resolved left pleural effusion.  POSTOPERATIVE DIAGNOSIS:  Advanced age nonsmall cell carcinoma of the lung with resolved left pleural effusion.  DESCRIPTION OF PROCEDURE:  The patient was brought from preop holding where informed consent was documented, final laboratory testing was reviewed, as well as most recent chest x-ray images and final issues addressed with the patient.  The proper sites  were marked.  The patient was brought to the operating room and placed supine on the operating table and general anesthesia was induced.  The chest and abdomen were prepped and draped as a sterile field.  Fluoroscopy was available for use during the  procedures.  A small incision was made underneath the right clavicle for placement of the Port-A-Cath.  Attempts at using the Seldinger technique for placement of a guidewire into the central venous system were unsuccessful and the small incision was closed.  A small incision was then made under the left clavicle and using the Seldinger technique, a guidewire was passed into the SVC and right atrium and confirmed by fluoroscopy.  A small incision was made in the midclavicular line at the third interspace for the Port-A-Cath pocket.  The pocket was created.  The Port-A-Cath was then tunneled from the pocket incision up to the location of the guidewire.  The catheter was cut to the appropriate length.  The dilator sheath system were placed over the guidewire into the SVC and confirmed under fluoroscopy.   The  dilator was removed and the catheter fed through the tear-away sheath.  Fluoroscopic images were taken to confirm proper placement.  However, the Port-A-Cath had advanced not into the curve around the SVC, but into the left subclavian vein.  Using fluoroscopic assistance, the catheter was then manipulated and advanced around into the SVC into the right atrial junction.  It was flushed with heparin saline.  Next, the Port-A-Cath reservoir was secured into the subcutaneous pocket and the pocket was closed in layers using Vicryl.  The upper incision where the catheter had been inserted underneath the clavicle was then closed in layers using subcutaneous  Vicryl and 2 nylon skin sutures.  Sterile dressings were applied.  Next, attention was directed to the left PleurX catheter.  The exit incision was enlarged.  Using careful technique, the soft tissue was separated from the PleurX catheter to the Dacron button.  Once the button was freed, the catheter was removed from  the pleural space.  The catheter had been drained prior to removal and there was minimal, zero drainage.  The wound was irrigated and then closed in layers using interrupted Vicryl subcutaneous and interrupted nylon for the skin.  A sterile dressing was applied.  The patient was then reversed from general anesthesia and returned to the recovery room after a  chest x-ray showed good catheter placement in good position without pneumothorax.  VN/NUANCE  D:02/21/2019 T:02/21/2019 JOB:[id number]

## 2019-02-21 NOTE — Anesthesia Procedure Notes (Addendum)
Procedure Name: Intubation Date/Time: 02/21/2019 7:48 AM Performed by: Janene Harvey, CRNA Pre-anesthesia Checklist: Patient identified, Emergency Drugs available, Suction available and Patient being monitored Patient Re-evaluated:Patient Re-evaluated prior to induction Oxygen Delivery Method: Circle system utilized Preoxygenation: Pre-oxygenation with 100% oxygen Induction Type: IV induction, Rapid sequence and Cricoid Pressure applied Laryngoscope Size: Mac and 3 Grade View: Grade I Tube type: Oral Tube size: 7.0 mm Number of attempts: 1 Airway Equipment and Method: Stylet and Oral airway Placement Confirmation: ETT inserted through vocal cords under direct vision,  positive ETCO2 and breath sounds checked- equal and bilateral Secured at: 21 cm Tube secured with: Tape Dental Injury: Teeth and Oropharynx as per pre-operative assessment  Comments: DL x1 by Morton Peters, inadequate view, lip laceration noted to left upper lip. DL by CRNA with MAC 3, grade I view, ett passed easily.

## 2019-02-21 NOTE — Telephone Encounter (Signed)
Pharmacy notified of preferred drug for pt.

## 2019-02-21 NOTE — Brief Op Note (Addendum)
02/21/2019  9:20 AM  PATIENT:  Nancy Moore  54 y.o. female  PRE-OPERATIVE DIAGNOSIS:  lung cancer and resolved effusion  POST-OPERATIVE DIAGNOSIS:  lung cancer and resolved effusion  PROCEDURE:  Procedure(s): INSERTION PORT-A-CATH (Left) REMOVAL OF PLEURAL DRAINAGE CATHETER (Left)  SURGEON:  Surgeon(s) and Role:    Ivin Poot, MD - Primary  PHYSICIAN ASSISTANT: none  ASSISTANTS: none   ANESTHESIA:   general  EBL:  47m  BLOOD ADMINISTERED:none  DRAINS: none   LOCAL MEDICATIONS USED:  NONE  SPECIMEN:  No Specimen  DISPOSITION OF SPECIMEN:  na  COUNTS:  YES and NO yes  TOURNIQUET:  * No tourniquets in log *  DICTATION: .Dragon Dictation  PLAN OF CARE: Discharge to home after PACU  PATIENT DISPOSITION:  PACU - hemodynamically stable.   Delay start of Pharmacological VTE agent (>24hrs) due to surgical blood loss or risk of bleeding: yes    Postop Note: After providing urgent surgical care to another patient in ICU I returned to check on Nancy Moore who had been discharged to home by PACU saff without contacting me.. O2 sat recorded as 93 % room air. I immediately walked to the hospital patient entrance and exit ares and she had been picked up to go home I will call the patient at home to check on postop status.

## 2019-02-21 NOTE — Anesthesia Postprocedure Evaluation (Signed)
Anesthesia Post Note  Patient: Nancy Moore  Procedure(s) Performed: INSERTION PORT-A-CATH (Left Chest) REMOVAL OF PLEURAL DRAINAGE CATHETER (Left Chest)     Patient location during evaluation: PACU Anesthesia Type: General Level of consciousness: awake and alert Pain management: pain level controlled Vital Signs Assessment: post-procedure vital signs reviewed and stable Respiratory status: spontaneous breathing, nonlabored ventilation and respiratory function stable Cardiovascular status: blood pressure returned to baseline and stable Postop Assessment: no apparent nausea or vomiting Anesthetic complications: no    Last Vitals:  Vitals:   02/21/19 0930 02/21/19 1000  BP: 125/66 121/66  Pulse: (!) 105 (!) 102  Resp: 15 15  Temp: 36.6 C 36.6 C  SpO2: 100% 93%    Last Pain:  Vitals:   02/21/19 0948  TempSrc:   PainSc: [place]

## 2019-02-21 NOTE — H&P (Signed)
. provider found Primary Cardiologist is No primary care provider on file. PCP is Copland, Gay Filler, MD     HPI:   I spoke with Nancy Moore (DOB 1965-05-29 ) via telephone on 02/09/2019 at 11:32 AM and verified that I was speaking with the correct person using more than one form of identification.  We discussed the reason(s) for conducting our visit virtually instead of in-person.  The patient expressed understanding the circumstances and agreed to proceed as described.      Patient had a left Port-A-Cath placed last year for malignant left pleural effusion, history of adenocarcinoma lung.  Drainage has significantly dropped off and is only scant drainage for the past 3 sessions.  We will plan on removing the catheter next week.  Also at that time she will have an outpatient right Port-A-Cath placement as unfortunately she has developed lymphangitic tumor spread in her left lung.   I discussed the procedure of right  Port-A-Cath placement with combined left Pleurx catheter removal under MAC anesthesia at [facility] next week.  Scheduled for January 22.  She understands the risks of bleeding, pneumothorax pain and infection.  I discussed the benefit and utility of having a Port-A-Cath for chemotherapy infusion and blood work.  A chest x-ray will be obtained prior to procedure to make sure the left pleural effusion remains minimal.     Current Outpatient Medications  Medication Sig Dispense Refill  . albuterol (VENTOLIN HFA) 108 (90 Base) MCG/ACT inhaler Inhale 2 puffs into the lungs every 6 (six) hours as needed for wheezing or shortness of breath. 8 g 1  . clindamycin (CLINDAGEL) 1 % gel Apply 1 application topically 2 (two) times daily. 30 g 1  . dexamethasone (DECADRON) 4 MG tablet 1 tablet p.o. twice daily the day before, day of and day after chemotherapy every 3 weeks 40 tablet 1  . fluticasone (FLONASE) 50 MCG/ACT nasal spray INSTILL 2 SPRAYS INTO BOTH NOSTRILS DAILY 16 g 0  . folic acid  (FOLVITE) 1 MG tablet Take 1 tablet (1 mg total) by mouth daily. 30 tablet 4  . furosemide (LASIX) 40 MG tablet TAKE 1 TABLET BY MOUTH ONCE DAILY 90 tablet 1  . HYDROcodone-homatropine (HYCODAN) 5-1.5 MG/5ML syrup Take 5 mLs by mouth every 6 (six) hours as needed for cough. 240 mL 0  . loperamide (IMODIUM) 1 MG/5ML solution Take 2 mg as needed by mouth for diarrhea or loose stools.      . magnesium oxide (MAG-OX) 400 (241.3 Mg) MG tablet Take 1 tablet (400 mg total) by mouth daily.      . methocarbamol (ROBAXIN) 500 MG tablet TAKE 1 TABLET (500 MG TOTAL) BY MOUTH EVERY 8 (EIGHT) HOURS AS NEEDED FOR MUSCLE SPASMS. 30 tablet 5  . methylPREDNISolone (MEDROL DOSEPAK) 4 MG TBPK tablet Use as instructed. 21 tablet 0  . nystatin cream (MYCOSTATIN) Apply 1 application topically 2 (two) times daily. (Patient taking differently: Apply 1 application topically daily as needed for dry skin. ) 30 g 0  . Oxycodone HCl 10 MG TABS TAKE 1 TABLET BY MOUTH 3 TIMES DAILY AS NEEDED 60 tablet 0  . pantoprazole (PROTONIX) 40 MG tablet TAKE 1 TABLET (40 MG TOTAL) BY MOUTH DAILY AT 12 NOON. (Patient taking differently: Take 40 mg by mouth daily. ) 90 tablet 3  . potassium chloride SA (K-DUR) 20 MEQ tablet TAKE 1 TABLET (20 MEQ TOTAL) BY MOUTH 2 TIMES DAILY. (Patient taking differently: Take 20 mEq by mouth 2 (  two) times daily. ) 180 tablet 3  . prochlorperazine (COMPAZINE) 10 MG tablet Take 1 tablet (10 mg total) every 6 (six) hours as needed by mouth for nausea or vomiting. 30 tablet 1  . prochlorperazine (COMPAZINE) 10 MG tablet Take 1 tablet (10 mg total) by mouth every 6 (six) hours as needed for nausea or vomiting. 30 tablet 0  . senna-docusate (SENOKOT-S) 8.6-50 MG tablet Take 1 tablet by mouth at bedtime. (Patient taking differently: Take 1 tablet by mouth daily as needed for mild constipation. )      . TAGRISSO 80 MG tablet TAKE 1 TABLET BY MOUTH ONCE DAILY 30 tablet 0    No current facility-administered medications  for this visit.        Diagnostic Tests:   None     Impression: Progression of non-small cell carcinoma with lymphangitic spread of the left lung with systemic chemotherapy plan. History of left malignant pleural effusion status post Pleurx catheter placement.  The malignant effusion has significantly improved with scant output the last week and a half   Plan: Patient repair for outpatient right Port-A-Cath placement January 22 at [facility]  At that time we will remove the left  Pleurx catheter as well.           I discussed limitations of evaluation and management via telephone.  The patient was advised to call back for repeat telephone consultation or to seek an in-person evaluation if questions arise or the patient's clinical condition changes in any significant manner.   I spent in excess of 10 minutes of non-face-to-face time during the conduct of this telephone virtual office consultation.   Level 1  (99441)             5-10 minutes Level 2  (99442)            11-20 minutes Level 3  (99443)            21-30 minutes       02/09/2019 11:32 AM                Electronically signed by Ivin Poot, MD at 02/09/2019 11:39 AM   Telephone on 02/09/2019     Detailed Report

## 2019-02-21 NOTE — Transfer of Care (Signed)
Immediate Anesthesia Transfer of Care Note  Patient: Nancy Moore  Procedure(s) Performed: INSERTION PORT-A-CATH (Left Chest) REMOVAL OF PLEURAL DRAINAGE CATHETER (Left Chest)  Patient Location: PACU  Anesthesia Type:General  Level of Consciousness: drowsy  Airway & Oxygen Therapy: Patient Spontanous Breathing and Patient connected to face mask oxygen  Post-op Assessment: Report given to RN and Post -op Vital signs reviewed and stable  Post vital signs: Reviewed  Last Vitals:  Vitals Value Taken Time  BP 125/66 02/21/19 0929  Temp    Pulse 106 02/21/19 0931  Resp 21 02/21/19 0931  SpO2 100 % 02/21/19 0931  Vitals shown include unvalidated device data.  Last Pain:  Vitals:   02/21/19 0610  TempSrc:   PainSc: 0-No pain      Patients Stated Pain Goal: 6 (16/10/96 0454)  Complications: No apparent anesthesia complications

## 2019-02-22 ENCOUNTER — Encounter: Payer: Self-pay | Admitting: *Deleted

## 2019-02-23 ENCOUNTER — Telehealth: Payer: Self-pay | Admitting: Medical Oncology

## 2019-02-23 ENCOUNTER — Telehealth: Payer: Self-pay | Admitting: *Deleted

## 2019-02-23 NOTE — Telephone Encounter (Signed)
appts confirmed.Instructed to apply cream to port site 1 hour prior to port access.

## 2019-02-24 ENCOUNTER — Other Ambulatory Visit: Payer: 59

## 2019-02-24 ENCOUNTER — Telehealth: Payer: Self-pay | Admitting: Medical Oncology

## 2019-02-24 ENCOUNTER — Other Ambulatory Visit: Payer: Self-pay | Admitting: Medical Oncology

## 2019-02-24 ENCOUNTER — Other Ambulatory Visit: Payer: Self-pay

## 2019-02-24 ENCOUNTER — Inpatient Hospital Stay: Payer: 59

## 2019-02-24 DIAGNOSIS — Z5111 Encounter for antineoplastic chemotherapy: Secondary | ICD-10-CM

## 2019-02-24 DIAGNOSIS — Z5112 Encounter for antineoplastic immunotherapy: Secondary | ICD-10-CM | POA: Diagnosis not present

## 2019-02-24 DIAGNOSIS — C3492 Malignant neoplasm of unspecified part of left bronchus or lung: Secondary | ICD-10-CM

## 2019-02-24 LAB — CBC WITH DIFFERENTIAL (CANCER CENTER ONLY)
Abs Immature Granulocytes: 0.01 10*3/uL (ref 0.00–0.07)
Basophils Absolute: 0 10*3/uL (ref 0.0–0.1)
Basophils Relative: 0 %
Eosinophils Absolute: 0.2 10*3/uL (ref 0.0–0.5)
Eosinophils Relative: 4 %
HCT: 34.7 % — ABNORMAL LOW (ref 36.0–46.0)
Hemoglobin: 10.8 g/dL — ABNORMAL LOW (ref 12.0–15.0)
Immature Granulocytes: 0 %
Lymphocytes Relative: 22 %
Lymphs Abs: 0.9 10*3/uL (ref 0.7–4.0)
MCH: 27.7 pg (ref 26.0–34.0)
MCHC: 31.1 g/dL (ref 30.0–36.0)
MCV: 89 fL (ref 80.0–100.0)
Monocytes Absolute: 0.5 10*3/uL (ref 0.1–1.0)
Monocytes Relative: 12 %
Neutro Abs: 2.7 10*3/uL (ref 1.7–7.7)
Neutrophils Relative %: 62 %
Platelet Count: 109 10*3/uL — ABNORMAL LOW (ref 150–400)
RBC: 3.9 MIL/uL (ref 3.87–5.11)
RDW: 14.9 % (ref 11.5–15.5)
WBC Count: 4.3 10*3/uL (ref 4.0–10.5)
nRBC: 0 % (ref 0.0–0.2)

## 2019-02-24 LAB — CMP (CANCER CENTER ONLY)
ALT: 12 U/L (ref 0–44)
AST: 16 U/L (ref 15–41)
Albumin: 3.2 g/dL — ABNORMAL LOW (ref 3.5–5.0)
Alkaline Phosphatase: 103 U/L (ref 38–126)
Anion gap: 9 (ref 5–15)
BUN: 9 mg/dL (ref 6–20)
CO2: 29 mmol/L (ref 22–32)
Calcium: 9.1 mg/dL (ref 8.9–10.3)
Chloride: 101 mmol/L (ref 98–111)
Creatinine: 0.92 mg/dL (ref 0.44–1.00)
GFR, Est AFR Am: >60 mL/min (ref >60)
GFR, Estimated: >60 mL/min (ref >60)
Glucose, Bld: 97 mg/dL (ref 70–99)
Potassium: 4.3 mmol/L (ref 3.5–5.1)
Sodium: 139 mmol/L (ref 135–145)
Total Bilirubin: 0.2 mg/dL — ABNORMAL LOW (ref 0.3–1.2)
Total Protein: 6.9 g/dL (ref 6.5–8.1)

## 2019-02-24 MED ORDER — DEXAMETHASONE 4 MG PO TABS: ORAL_TABLET | ORAL | 2 refills | Status: DC

## 2019-02-24 MED FILL — DEXAMETHASONE 4 MG TABLET: 4 | 21 days supply | Qty: 6 | Fill #0

## 2019-02-24 NOTE — Telephone Encounter (Signed)
Education management

## 2019-03-02 ENCOUNTER — Other Ambulatory Visit: Payer: Self-pay | Admitting: Family Medicine

## 2019-03-02 DIAGNOSIS — C3492 Malignant neoplasm of unspecified part of left bronchus or lung: Secondary | ICD-10-CM

## 2019-03-02 DIAGNOSIS — R52 Pain, unspecified: Secondary | ICD-10-CM

## 2019-03-02 MED FILL — FOLIC ACID 1 MG TABS: 1 | 30 days supply | Qty: 30 | Fill #1

## 2019-03-02 MED FILL — oxyCODONE HCL 10 MG TABS: 10 | 20 days supply | Qty: 60 | Fill #0

## 2019-03-02 MED FILL — POTASSIUM CHLORIDE CRYS ER: 20 | 30 days supply | Qty: 60 | Fill #6

## 2019-03-02 NOTE — Telephone Encounter (Signed)
Requesting:Oxycodone Contract:none HYH:OOIL Last Visit:06/11/2018 Next Visit:none scheduled Last Refill:12/30/2018  Please Advise

## 2019-03-03 ENCOUNTER — Other Ambulatory Visit: Payer: Self-pay

## 2019-03-03 ENCOUNTER — Encounter: Payer: Self-pay | Admitting: Internal Medicine

## 2019-03-03 ENCOUNTER — Inpatient Hospital Stay: Payer: 59 | Attending: Oncology

## 2019-03-03 ENCOUNTER — Inpatient Hospital Stay: Payer: 59

## 2019-03-03 ENCOUNTER — Inpatient Hospital Stay: Payer: 59 | Admitting: Nutrition

## 2019-03-03 ENCOUNTER — Inpatient Hospital Stay (HOSPITAL_BASED_OUTPATIENT_CLINIC_OR_DEPARTMENT_OTHER): Payer: 59 | Admitting: Internal Medicine

## 2019-03-03 VITALS — HR 97

## 2019-03-03 VITALS — BP 136/86 | HR 102 | Temp 97.8°F | Resp 20 | Ht 66.0 in | Wt 213.9 lb

## 2019-03-03 DIAGNOSIS — R634 Abnormal weight loss: Secondary | ICD-10-CM | POA: Diagnosis not present

## 2019-03-03 DIAGNOSIS — I10 Essential (primary) hypertension: Secondary | ICD-10-CM | POA: Insufficient documentation

## 2019-03-03 DIAGNOSIS — Z5111 Encounter for antineoplastic chemotherapy: Secondary | ICD-10-CM

## 2019-03-03 DIAGNOSIS — C3490 Malignant neoplasm of unspecified part of unspecified bronchus or lung: Secondary | ICD-10-CM

## 2019-03-03 DIAGNOSIS — C3492 Malignant neoplasm of unspecified part of left bronchus or lung: Secondary | ICD-10-CM

## 2019-03-03 DIAGNOSIS — C7931 Secondary malignant neoplasm of brain: Secondary | ICD-10-CM | POA: Diagnosis not present

## 2019-03-03 DIAGNOSIS — K5090 Crohn's disease, unspecified, without complications: Secondary | ICD-10-CM | POA: Diagnosis not present

## 2019-03-03 DIAGNOSIS — Z452 Encounter for adjustment and management of vascular access device: Secondary | ICD-10-CM | POA: Insufficient documentation

## 2019-03-03 DIAGNOSIS — Z5112 Encounter for antineoplastic immunotherapy: Secondary | ICD-10-CM | POA: Insufficient documentation

## 2019-03-03 DIAGNOSIS — R21 Rash and other nonspecific skin eruption: Secondary | ICD-10-CM | POA: Diagnosis not present

## 2019-03-03 DIAGNOSIS — Z95828 Presence of other vascular implants and grafts: Secondary | ICD-10-CM

## 2019-03-03 LAB — CBC WITH DIFFERENTIAL (CANCER CENTER ONLY)
Abs Immature Granulocytes: 0.13 10*3/uL — ABNORMAL HIGH (ref 0.00–0.07)
Basophils Absolute: 0 10*3/uL (ref 0.0–0.1)
Basophils Relative: 0 %
Eosinophils Absolute: 0 10*3/uL (ref 0.0–0.5)
Eosinophils Relative: 0 %
HCT: 33.7 % — ABNORMAL LOW (ref 36.0–46.0)
Hemoglobin: 10.5 g/dL — ABNORMAL LOW (ref 12.0–15.0)
Immature Granulocytes: 1 %
Lymphocytes Relative: 16 %
Lymphs Abs: 1.4 10*3/uL (ref 0.7–4.0)
MCH: 27.5 pg (ref 26.0–34.0)
MCHC: 31.2 g/dL (ref 30.0–36.0)
MCV: 88.2 fL (ref 80.0–100.0)
Monocytes Absolute: 1.6 10*3/uL — ABNORMAL HIGH (ref 0.1–1.0)
Monocytes Relative: 17 %
Neutro Abs: 6.1 10*3/uL (ref 1.7–7.7)
Neutrophils Relative %: 66 %
Platelet Count: 526 10*3/uL — ABNORMAL HIGH (ref 150–400)
RBC: 3.82 MIL/uL — ABNORMAL LOW (ref 3.87–5.11)
RDW: 16 % — ABNORMAL HIGH (ref 11.5–15.5)
WBC Count: 9.3 10*3/uL (ref 4.0–10.5)
nRBC: 0 % (ref 0.0–0.2)

## 2019-03-03 LAB — CMP (CANCER CENTER ONLY)
ALT: 17 U/L (ref 0–44)
AST: 21 U/L (ref 15–41)
Albumin: 3.3 g/dL — ABNORMAL LOW (ref 3.5–5.0)
Alkaline Phosphatase: 91 U/L (ref 38–126)
Anion gap: 11 (ref 5–15)
BUN: 11 mg/dL (ref 6–20)
CO2: 24 mmol/L (ref 22–32)
Calcium: 9.3 mg/dL (ref 8.9–10.3)
Chloride: 102 mmol/L (ref 98–111)
Creatinine: 1.06 mg/dL — ABNORMAL HIGH (ref 0.44–1.00)
GFR, Est AFR Am: >60 mL/min (ref >60)
GFR, Estimated: >60 mL/min (ref >60)
Glucose, Bld: 158 mg/dL — ABNORMAL HIGH (ref 70–99)
Potassium: 3.5 mmol/L (ref 3.5–5.1)
Sodium: 137 mmol/L (ref 135–145)
Total Bilirubin: 0.1 mg/dL — ABNORMAL LOW (ref 0.3–1.2)
Total Protein: 7.2 g/dL (ref 6.5–8.1)

## 2019-03-03 LAB — TOTAL PROTEIN, URINE DIPSTICK: Protein, ur: NEGATIVE mg/dL

## 2019-03-03 MED ORDER — DEXAMETHASONE SODIUM PHOSPHATE 10 MG/ML IJ SOLN
INTRAMUSCULAR | Status: AC
  Filled 2019-03-03: qty 1

## 2019-03-03 MED ORDER — PALONOSETRON HCL INJECTION 0.25 MG/5ML
0.2500 mg | Freq: Once | INTRAVENOUS | Status: AC
  Administered 2019-03-03: 0.25 mg via INTRAVENOUS

## 2019-03-03 MED ORDER — SODIUM CHLORIDE 0.9 % IV SOLN: 679.0000 mg | Freq: Once | INTRAVENOUS | Status: DC

## 2019-03-03 MED ORDER — SODIUM CHLORIDE 0.9% FLUSH
10.0000 mL | INTRAVENOUS | Status: DC | PRN
  Administered 2019-03-03: 10 mL via INTRAVENOUS
  Filled 2019-03-03: qty 10

## 2019-03-03 MED ORDER — SODIUM CHLORIDE 0.9 % IV SOLN
15.2000 mg/kg | Freq: Once | INTRAVENOUS | Status: AC
  Administered 2019-03-03: 1500 mg via INTRAVENOUS
  Filled 2019-03-03: qty 48

## 2019-03-03 MED ORDER — SODIUM CHLORIDE 0.9 % IV SOLN
150.0000 mg | Freq: Once | INTRAVENOUS | Status: AC
  Administered 2019-03-03: 150 mg via INTRAVENOUS
  Filled 2019-03-03: qty 150

## 2019-03-03 MED ORDER — SODIUM CHLORIDE 0.9 % IV SOLN
500.0000 mg/m2 | Freq: Once | INTRAVENOUS | Status: AC
  Administered 2019-03-03: 13:00:00 1100 mg via INTRAVENOUS
  Filled 2019-03-03: qty 40

## 2019-03-03 MED ORDER — PALONOSETRON HCL INJECTION 0.25 MG/5ML
INTRAVENOUS | Status: AC
  Filled 2019-03-03: qty 5

## 2019-03-03 MED ORDER — SODIUM CHLORIDE 0.9 % IV SOLN
Freq: Once | INTRAVENOUS | Status: AC
  Filled 2019-03-03: qty 250

## 2019-03-03 MED ORDER — DEXAMETHASONE SODIUM PHOSPHATE 10 MG/ML IJ SOLN
10.0000 mg | Freq: Once | INTRAMUSCULAR | Status: AC
  Administered 2019-03-03: 10 mg via INTRAVENOUS

## 2019-03-03 MED ORDER — SODIUM CHLORIDE 0.9% FLUSH
10.0000 mL | INTRAVENOUS | Status: DC | PRN
  Administered 2019-03-03: 10 mL
  Filled 2019-03-03: qty 10

## 2019-03-03 MED ORDER — HEPARIN SOD (PORK) LOCK FLUSH 100 UNIT/ML IV SOLN
500.0000 [IU] | Freq: Once | INTRAVENOUS | Status: AC | PRN
  Administered 2019-03-03: 500 [IU]
  Filled 2019-03-03: qty 5

## 2019-03-03 MED ORDER — SODIUM CHLORIDE 0.9 % IV SOLN
600.0000 mg | Freq: Once | INTRAVENOUS | Status: AC
  Administered 2019-03-03: 600 mg via INTRAVENOUS
  Filled 2019-03-03: qty 60

## 2019-03-03 NOTE — Patient Instructions (Signed)
Nancy Moore Discharge Instructions for Patients Receiving Chemotherapy  Today you received the following chemotherapy agents: bevacizumab, alimta, carboplatin   To help prevent nausea and vomiting after your treatment, we encourage you to take your nausea medication as directed.    If you develop nausea and vomiting that is not controlled by your nausea medication, call the clinic.   BELOW ARE SYMPTOMS THAT SHOULD BE REPORTED IMMEDIATELY:  *FEVER GREATER THAN 100.5 F  *CHILLS WITH OR WITHOUT FEVER  NAUSEA AND VOMITING THAT IS NOT CONTROLLED WITH YOUR NAUSEA MEDICATION  *UNUSUAL SHORTNESS OF BREATH  *UNUSUAL BRUISING OR BLEEDING  TENDERNESS IN MOUTH AND THROAT WITH OR WITHOUT PRESENCE OF ULCERS  *URINARY PROBLEMS  *BOWEL PROBLEMS  UNUSUAL RASH Items with * indicate a potential emergency and should be followed up as soon as possible.  Feel free to call the clinic should you have any questions or concerns. The clinic phone number is [phone].  Please show the [place] at check-in to the Emergency Department and triage nurse.

## 2019-03-03 NOTE — Progress Notes (Signed)
Nutrition follow-up completed with patient during infusion for non-small cell lung cancer. Patient reports she is eating better. She denies nausea and vomiting. Reports constipation has improved. She is drinking 2 boost breeze every day. Weight is stable and was documented as 213.9 pounds on February 4 which is stable from 213 pounds January 15. Patient has no questions or concerns at this time.  Nutrition diagnosis: Unintended weight loss has improved.  Intervention: Patient was educated to continue strategies for increased calories and protein. Continue boost breeze twice daily between meals. Continue bowel regimen to avoid constipation. Questions answered.  Teach back method used.  Monitoring, evaluation, goals: Patient will tolerate adequate calories and protein to minimize weight loss throughout treatment.  Next visit: To be scheduled as needed.  Patient has my contact information if she develops any questions.  **Disclaimer: This note was dictated with voice recognition software. Similar sounding words can inadvertently be transcribed and this note may contain transcription errors which may not have been corrected upon publication of note.**

## 2019-03-03 NOTE — Patient Instructions (Signed)

## 2019-03-03 NOTE — Progress Notes (Signed)
[place] [phone]) [phone]   [phone]) [phone]  OFFICE PROGRESS NOTE  Copland, Gay Filler, MD [address] [place] [address]  DIAGNOSIS: DIAGNOSIS: stage IV (T3, N2, M1 a) non-small cell lung cancer, poorly differentiated adenocarcinoma with extensive miliary distribution in the lungs bilaterally diagnosed in September 2018. 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 NEGATIVE for the Exon 20 T790M mutation. Repeat molecular studies by guardant 360 at time of progression showed ATM [id number] [facility], V600E  PRIOR THERAPY:  1) Gilotrif 40 mg daily started 11/08/2016.Status post 13 months of treatment. 2) Tagrisso 80 mg p.o. daily.  First dose started January 01, 2018.  Status post 12 months of treatment.   CURRENT THERAPY: 1) systemic chemotherapy with carboplatin for AUC 5, Alimta 500 mg/M2 and Avastin 15 mg/KG every 3 weeks.  First dose February 10, 2018.  Status post 1 cycle.  INTERVAL HISTORY: LDaiva Moore 54y.o. female returns to the clinic today for follow-up visit.  The patient is feeling fine today with no concerning complaints except for mild fatigue.  She was seen recently by Dr. VPrescott Gumand she underwent removal of the left Pleurx catheter as well as placement of a Port-A-Cath in the left subclavian vein.  The patient denied having any current chest pain but continues to have shortness of breath with exertion with mild cough and no hemoptysis.  She denied having any fever or chills.  She has no current nausea, vomiting, diarrhea or constipation.  She has no headache or visual changes.  She has no bleeding issues.  She is here today for evaluation before starting cycle #2 of her systemic chemotherapy.  MEDICAL HISTORY: Past Medical History:  Diagnosis Date   Adenocarcinoma of left lung, stage 4 ([facility] 10/28/2016    Anemia    Arthritis    Constipation    Crohn's disease ([facility]    in remission 35 years +   Dyspnea    gets "winded"   GERD (gastroesophageal reflux disease)    Goals of care, counseling/discussion 10/28/2016   History of hiatal hernia    noted on CT 12/20   Hypertension    Pneumonia    walking pneumonia in the late 80's   Recurrent pleural effusion on left     ALLERGIES:  is allergic to percocet [oxycodone-acetaminophen]; lisinopril; and losartan.  MEDICATIONS:  Current Outpatient Medications  Medication Sig Dispense Refill   albuterol (VENTOLIN HFA) 108 (90 Base) MCG/ACT inhaler Inhale 2 puffs into the lungs every 6 (six) hours as needed for wheezing or shortness of breath. 8 g 1   clindamycin (CLINDAGEL) 1 % gel Apply 1 application topically 2 (two) times daily. 30 g 1   dexamethasone (DECADRON) 4 MG tablet 1 tablet p.o. twice daily the day before, day of and day after chemotherapy every 3 weeks 20 tablet 2   fluticasone (FLONASE) 50 MCG/ACT nasal spray INSTILL 2 SPRAYS INTO BOTH NOSTRILS DAILY (Patient taking differently: Place 2 sprays into both nostrils daily as needed for allergies. ) 16 g 0   folic acid (FOLVITE) 1 MG tablet Take 1 tablet (1 mg total) by mouth daily. 30 tablet 4   furosemide (LASIX) 40 MG tablet TAKE 1 TABLET BY MOUTH ONCE DAILY (Patient taking differently: Take 40 mg by mouth daily. ) 90 tablet 1   HYDROcodone-homatropine (HYCODAN) 5-1.5 MG/5ML syrup Take  5 mLs by mouth every 6 (six) hours as needed for cough. (Patient not taking: Reported on 02/14/2019) 240 mL 0   lidocaine-prilocaine (EMLA) cream Apply 1 application topically as needed. 30 g 0   loperamide (IMODIUM) 1 MG/5ML solution Take 2 mg as needed by mouth for diarrhea or loose stools.     magnesium oxide (MAG-OX) 400 (241.3 Mg) MG tablet Take 1 tablet (400 mg total) by mouth daily.     methocarbamol (ROBAXIN) 500 MG tablet TAKE 1 TABLET (500 MG TOTAL) BY MOUTH EVERY 8 (EIGHT) HOURS  AS NEEDED FOR MUSCLE SPASMS. 30 tablet 5   nystatin cream (MYCOSTATIN) Apply 1 application topically 2 (two) times daily. (Patient taking differently: Apply 1 application topically daily as needed for dry skin. ) 30 g 0   ondansetron (ZOFRAN) 8 MG tablet Take 1 tablet (8 mg total) by mouth every 8 (eight) hours as needed for nausea or vomiting. 20 tablet 0   Oxycodone HCl 10 MG TABS TAKE 1 TABLET BY MOUTH 3 TIMES DAILY AS NEEDED 60 tablet 0   pantoprazole (PROTONIX) 40 MG tablet TAKE 1 TABLET (40 MG TOTAL) BY MOUTH DAILY AT 12 NOON. (Patient taking differently: Take 40 mg by mouth daily. ) 90 tablet 3   potassium chloride SA (K-DUR) 20 MEQ tablet TAKE 1 TABLET (20 MEQ TOTAL) BY MOUTH 2 TIMES DAILY. (Patient taking differently: Take 20 mEq by mouth 2 (two) times daily. ) 180 tablet 3   prochlorperazine (COMPAZINE) 10 MG tablet Take 1 tablet (10 mg total) by mouth every 6 (six) hours as needed for nausea or vomiting. 30 tablet 0   senna-docusate (SENOKOT-S) 8.6-50 MG tablet Take 1 tablet by mouth at bedtime.     No current facility-administered medications for this visit.    SURGICAL HISTORY:  Past Surgical History:  Procedure Laterality Date   ABDOMINAL HYSTERECTOMY     partial hysterectomy   BREAST EXCISIONAL BIOPSY Left 20+ yrs ago   benign   CHEST TUBE INSERTION Left 10/26/2018   Procedure: INSERTION PLEURAL DRAINAGE CATHETER;  Surgeon: Ivin Poot, MD;  Location: [facility];  Service: Thoracic;  Laterality: Left;   COLONOSCOPY     PERICARDIAL WINDOW N/A 01/10/2017   Procedure: PERICARDIAL WINDOW- SUB XYPHOID, RIGHT CHEST TUBE;  Surgeon: Ivin Poot, MD;  Location: [facility];  Service: Thoracic;  Laterality: N/A;   PORTACATH PLACEMENT Left 02/21/2019   Procedure: INSERTION PORT-A-CATH;  Surgeon: Ivin Poot, MD;  Location: [facility];  Service: Thoracic;  Laterality: Left;   REMOVAL OF PLEURAL DRAINAGE CATHETER Left 02/21/2019   Procedure: REMOVAL OF PLEURAL DRAINAGE  CATHETER;  Surgeon: Ivin Poot, MD;  Location: [facility];  Service: Thoracic;  Laterality: Left;   VIDEO BRONCHOSCOPY Bilateral 10/21/2016   Procedure: VIDEO BRONCHOSCOPY WITH FLUORO;  Surgeon: Tanda Rockers, MD;  Location: [facility];  Service: Cardiopulmonary;  Laterality: Bilateral;    REVIEW OF SYSTEMS:  A comprehensive review of systems was negative except for: Constitutional: positive for fatigue Respiratory: positive for dyspnea on exertion   PHYSICAL EXAMINATION: General appearance: alert, cooperative, fatigued and no distress Head: Normocephalic, without obvious abnormality, atraumatic Neck: no adenopathy, no JVD, supple, symmetrical, trachea midline and thyroid not enlarged, symmetric, no tenderness/mass/nodules Lymph nodes: Cervical, supraclavicular, and axillary nodes normal. Resp: clear to auscultation bilaterally Back: symmetric, no curvature. ROM normal. No CVA tenderness. Cardio: regular rate and rhythm, S1, S2 normal, no murmur, click, rub or gallop GI: soft, non-tender; bowel sounds normal; no masses,  no organomegaly Extremities: extremities normal, atraumatic, no cyanosis or edema  ECOG PERFORMANCE STATUS: 1 - Symptomatic but completely ambulatory  Blood pressure 136/86, pulse (!) 102, temperature 97.8 F (36.6 C), temperature source Temporal, resp. rate 20, height 5' 6"  (1.676 m), weight 213 lb 14.4 oz (97 kg), last menstrual period 03/15/2010, SpO2 97 %.  LABORATORY DATA: Lab Results  Component Value Date   WBC 9.3 03/03/2019   HGB 10.5 (L) 03/03/2019   HCT 33.7 (L) 03/03/2019   MCV 88.2 03/03/2019   PLT 526 (H) 03/03/2019      Chemistry      Component Value Date/Time   NA 139 02/24/2019 1226   NA 135 (L) 01/08/2017 1055   K 4.3 02/24/2019 1226   K 3.2 (L) 01/08/2017 1055   CL 101 02/24/2019 1226   CO2 29 02/24/2019 1226   CO2 25 01/08/2017 1055   BUN 9 02/24/2019 1226   BUN 14.6 01/08/2017 1055   CREATININE 0.92 02/24/2019 1226   CREATININE  1.3 (H) 01/08/2017 1055      Component Value Date/Time   CALCIUM 9.1 02/24/2019 1226   CALCIUM 9.5 01/08/2017 1055   ALKPHOS 103 02/24/2019 1226   ALKPHOS 82 01/08/2017 1055   AST 16 02/24/2019 1226   AST 31 01/08/2017 1055   ALT 12 02/24/2019 1226   ALT 34 01/08/2017 1055   BILITOT 0.2 (L) 02/24/2019 1226   BILITOT 1.74 (H) 01/08/2017 1055       RADIOGRAPHIC STUDIES: DG Chest 1 View  Result Date: 02/21/2019 CLINICAL DATA:  Port-A-Cath placement. Stage IV lung cancer. EXAM: CHEST  1 VIEW COMPARISON:  Radiographs 02/18/2019 and 12/29/2018. CT 12/31/2018. FINDINGS: 0911 hours. Tip of the endotracheal tube is 2.1 cm above the carina. A left subclavian Port-A-Cath has been placed, projecting to the level of the lower right atrium. Left chest tube has been removed. There is no pneumothorax. Small left pleural effusion and associated left basilar pulmonary opacity are unchanged. There are increased reticulonodular densities throughout both lungs over the last 3 days, suspicious for inflammation or edema superimposed on underlying lymphangitic carcinomatosis. IMPRESSION: 1. Interval left subclavian Port-A-Cath placement without evidence of pneumothorax. 2. Increased reticulonodular densities throughout both lungs suspicious for inflammation or edema superimposed on underlying lymphangitic carcinomatosis. Electronically Signed   By: Richardean Sale M.D.   On: 02/21/2019 09:27   DG Chest 2 View  Result Date: 02/18/2019 CLINICAL DATA:  54 year old female with stage IV lung adenocarcinoma with miliary lung metastasis. EXAM: CHEST - 2 VIEW COMPARISON:  Chest radiograph dated 12/29/2018 and CT dated 12/31/2018. FINDINGS: A left-sided drainage tube is noted overlying the mediastinum. Diffuse bilateral interstitial prominence, left greater right most consistent with lymphangitic carcinomatosis. Superimposed infection is not excluded. There has been interval decrease in the size of the left pleural effusion.  No pneumothorax. Stable cardiac silhouette. No acute osseous pathology. IMPRESSION: 1. A left-sided drainage tube overlies the mediastinum, likely a pleural drain catheter. 2. Interval decrease in the size of left pleural effusion. 3. Bilateral, left greater right, interstitial nodularity in keeping with lymphangitic carcinomatosis. Electronically Signed   By: Anner Crete M.D.   On: 02/18/2019 15:56   DG Fluoro Guide CV Line-No Report  Result Date: 02/21/2019 Fluoroscopy was utilized by the requesting physician.  No radiographic interpretation.    ASSESSMENT AND PLAN: This is a very pleasant 54 years old African-American female with metastatic non-small cell lung cancer, adenocarcinoma and positive for EGFR mutation with deletion in exon 19.  The  patient is currently on treatment with GILOTRIF 40 mg p.o. daily status post 13 months.  The patient is tolerating this treatment well except for few episodes of diarrhea and mild skin rash. She is complaining of dizzy spells and vertigo recently. Repeat CT scan of the chest, abdomen and pelvis performed recently showed some evidence for disease progression with increase in size and number of pulmonary nodules. Her treatment was switched to Tagrisso 80 mg p.o. daily started January 01, 2018.  She is status post 12 months of treatment The patient has been tolerating this treatment well with no concerning adverse effects. She had repeat MRI of the brain as well as CT scan of the chest, abdomen pelvis performed recently. Her MRI of the brain showed no concerning findings for disease progression in the brain but CT scan of the chest showed some evidence for progression with lymphangitic spread of the disease. She was referred to Dr. Durenda Hurt at [facility] and repeat molecular studies showed the emergence of BRAF mutation V600E. I recommended for the patient to consider systemic chemotherapy and she is here today for evaluation and discussion of this  option.  I had a lengthy discussion with the patient today regarding her condition.  She understands that a combination of BRAF inhibitor and Tagrisso has not been well studied at this point. She is currently undergoing systemic chemotherapy with carboplatin for AUC of 5, Alimta 500 mg/M2 and Avastin 15 mg/KG every 3 weeks, status post 1 cycle.  She also continues her current treatment with Tagrisso 80 mg p.o. daily. The patient continues to tolerate this treatment well with no concerning adverse effects. I recommended for her to proceed with the second cycle of her systemic chemotherapy with carboplatin, Alimta and Avastin as planned. She will come back for follow-up visit in 3 weeks for evaluation before the next cycle of her treatment.  For the nausea, she will continue on Zofran 8 mg p.o. every 8 hours as needed.  She will also alternate with Compazine if needed. For the weight loss, the patient was encouraged to increase her nutritional supplements. For the pain management, she will continue her current treatment with oxycodone as needed.  I will be happy to give her a refill in the future if needed. The patient was advised to call immediately if she has any concerning symptoms in the interval. The patient voices understanding of current disease status and treatment options and is in agreement with the current care plan. All questions were answered. The patient knows to call the clinic with any problems, questions or concerns. We can certainly see the patient much sooner if necessary.  Disclaimer: This note was dictated with voice recognition software. Similar sounding words can inadvertently be transcribed and may not be corrected upon review.

## 2019-03-07 ENCOUNTER — Telehealth: Payer: Self-pay | Admitting: Medical Oncology

## 2019-03-07 NOTE — Telephone Encounter (Signed)
Questions about insurance and payment of her chemotherapy. When I called her back she was on the phone with her insurance rep.

## 2019-03-10 ENCOUNTER — Inpatient Hospital Stay: Payer: 59

## 2019-03-10 ENCOUNTER — Other Ambulatory Visit: Payer: Self-pay

## 2019-03-10 DIAGNOSIS — Z5112 Encounter for antineoplastic immunotherapy: Secondary | ICD-10-CM | POA: Diagnosis not present

## 2019-03-10 DIAGNOSIS — C3492 Malignant neoplasm of unspecified part of left bronchus or lung: Secondary | ICD-10-CM

## 2019-03-10 LAB — CMP (CANCER CENTER ONLY)
ALT: 21 U/L (ref 0–44)
AST: 23 U/L (ref 15–41)
Albumin: 3.2 g/dL — ABNORMAL LOW (ref 3.5–5.0)
Alkaline Phosphatase: 90 U/L (ref 38–126)
Anion gap: 9 (ref 5–15)
BUN: 14 mg/dL (ref 6–20)
CO2: 29 mmol/L (ref 22–32)
Calcium: 8.9 mg/dL (ref 8.9–10.3)
Chloride: 101 mmol/L (ref 98–111)
Creatinine: 0.83 mg/dL (ref 0.44–1.00)
GFR, Est AFR Am: >60 mL/min (ref >60)
GFR, Estimated: >60 mL/min (ref >60)
Glucose, Bld: 133 mg/dL — ABNORMAL HIGH (ref 70–99)
Potassium: 3.8 mmol/L (ref 3.5–5.1)
Sodium: 139 mmol/L (ref 135–145)
Total Bilirubin: 0.4 mg/dL (ref 0.3–1.2)
Total Protein: 6.8 g/dL (ref 6.5–8.1)

## 2019-03-10 LAB — CBC WITH DIFFERENTIAL (CANCER CENTER ONLY)
Abs Immature Granulocytes: 0.04 10*3/uL (ref 0.00–0.07)
Basophils Absolute: 0 10*3/uL (ref 0.0–0.1)
Basophils Relative: 0 %
Eosinophils Absolute: 0 10*3/uL (ref 0.0–0.5)
Eosinophils Relative: 1 %
HCT: 33.7 % — ABNORMAL LOW (ref 36.0–46.0)
Hemoglobin: 10.4 g/dL — ABNORMAL LOW (ref 12.0–15.0)
Immature Granulocytes: 1 %
Lymphocytes Relative: 19 %
Lymphs Abs: 1 10*3/uL (ref 0.7–4.0)
MCH: 27.4 pg (ref 26.0–34.0)
MCHC: 30.9 g/dL (ref 30.0–36.0)
MCV: 88.7 fL (ref 80.0–100.0)
Monocytes Absolute: 0.3 10*3/uL (ref 0.1–1.0)
Monocytes Relative: 5 %
Neutro Abs: 4.1 10*3/uL (ref 1.7–7.7)
Neutrophils Relative %: 74 %
Platelet Count: 312 10*3/uL (ref 150–400)
RBC: 3.8 MIL/uL — ABNORMAL LOW (ref 3.87–5.11)
RDW: 15.9 % — ABNORMAL HIGH (ref 11.5–15.5)
WBC Count: 5.5 10*3/uL (ref 4.0–10.5)
nRBC: 0 % (ref 0.0–0.2)

## 2019-03-14 ENCOUNTER — Telehealth: Payer: Self-pay | Admitting: Medical Oncology

## 2019-03-14 DIAGNOSIS — R350 Frequency of micturition: Secondary | ICD-10-CM

## 2019-03-14 NOTE — Telephone Encounter (Addendum)
Returned pt call who reports urinary frequency with burning at the end of void. UA added on to weekly labs for tomorrow.

## 2019-03-14 NOTE — Addendum Note (Signed)
Addended by: Ardeen Garland on: 03/14/2019 02:45 PM   Modules accepted: Orders

## 2019-03-15 ENCOUNTER — Inpatient Hospital Stay (HOSPITAL_BASED_OUTPATIENT_CLINIC_OR_DEPARTMENT_OTHER): Payer: 59 | Admitting: Medical

## 2019-03-15 ENCOUNTER — Inpatient Hospital Stay: Payer: 59

## 2019-03-15 ENCOUNTER — Other Ambulatory Visit: Payer: Self-pay

## 2019-03-15 ENCOUNTER — Other Ambulatory Visit: Payer: Self-pay | Admitting: Medical

## 2019-03-15 VITALS — HR 102 | Resp 20

## 2019-03-15 VITALS — BP 111/42 | HR 113 | Temp 98.3°F | Resp 24

## 2019-03-15 DIAGNOSIS — T8130XA Disruption of wound, unspecified, initial encounter: Secondary | ICD-10-CM

## 2019-03-15 DIAGNOSIS — Z95828 Presence of other vascular implants and grafts: Secondary | ICD-10-CM

## 2019-03-15 DIAGNOSIS — C7931 Secondary malignant neoplasm of brain: Secondary | ICD-10-CM

## 2019-03-15 DIAGNOSIS — C3490 Malignant neoplasm of unspecified part of unspecified bronchus or lung: Secondary | ICD-10-CM

## 2019-03-15 DIAGNOSIS — R350 Frequency of micturition: Secondary | ICD-10-CM

## 2019-03-15 DIAGNOSIS — C3492 Malignant neoplasm of unspecified part of left bronchus or lung: Secondary | ICD-10-CM

## 2019-03-15 DIAGNOSIS — Z5112 Encounter for antineoplastic immunotherapy: Secondary | ICD-10-CM | POA: Diagnosis not present

## 2019-03-15 LAB — CMP (CANCER CENTER ONLY)
ALT: 19 U/L (ref 0–44)
AST: 21 U/L (ref 15–41)
Albumin: 3.5 g/dL (ref 3.5–5.0)
Alkaline Phosphatase: 97 U/L (ref 38–126)
Anion gap: 12 (ref 5–15)
BUN: 13 mg/dL (ref 6–20)
CO2: 25 mmol/L (ref 22–32)
Calcium: 9.3 mg/dL (ref 8.9–10.3)
Chloride: 102 mmol/L (ref 98–111)
Creatinine: 1.12 mg/dL — ABNORMAL HIGH (ref 0.44–1.00)
GFR, Est AFR Am: >60 mL/min (ref >60)
GFR, Estimated: 56 mL/min — ABNORMAL LOW (ref >60)
Glucose, Bld: 113 mg/dL — ABNORMAL HIGH (ref 70–99)
Potassium: 4.5 mmol/L (ref 3.5–5.1)
Sodium: 139 mmol/L (ref 135–145)
Total Bilirubin: 0.3 mg/dL (ref 0.3–1.2)
Total Protein: 7.3 g/dL (ref 6.5–8.1)

## 2019-03-15 LAB — URINALYSIS, COMPLETE (UACMP) WITH MICROSCOPIC
Glucose, UA: 100 mg/dL — AB
Hgb urine dipstick: NEGATIVE
Ketones, ur: NEGATIVE mg/dL
Nitrite: POSITIVE — AB
Protein, ur: 30 mg/dL — AB
RBC / HPF: NONE SEEN RBC/hpf (ref 0–5)
Specific Gravity, Urine: 1.02 (ref 1.005–1.030)
pH: 5.5 (ref 5.0–8.0)

## 2019-03-15 LAB — CBC WITH DIFFERENTIAL (CANCER CENTER ONLY)
Abs Immature Granulocytes: 0 10*3/uL (ref 0.00–0.07)
Basophils Absolute: 0 10*3/uL (ref 0.0–0.1)
Basophils Relative: 0 %
Eosinophils Absolute: 0.1 10*3/uL (ref 0.0–0.5)
Eosinophils Relative: 3 %
HCT: 30.5 % — ABNORMAL LOW (ref 36.0–46.0)
Hemoglobin: 9.7 g/dL — ABNORMAL LOW (ref 12.0–15.0)
Immature Granulocytes: 0 %
Lymphocytes Relative: 33 %
Lymphs Abs: 1.7 10*3/uL (ref 0.7–4.0)
MCH: 28 pg (ref 26.0–34.0)
MCHC: 31.8 g/dL (ref 30.0–36.0)
MCV: 87.9 fL (ref 80.0–100.0)
Monocytes Absolute: 1 10*3/uL (ref 0.1–1.0)
Monocytes Relative: 19 %
Neutro Abs: 2.4 10*3/uL (ref 1.7–7.7)
Neutrophils Relative %: 45 %
Platelet Count: 86 10*3/uL — ABNORMAL LOW (ref 150–400)
RBC: 3.47 MIL/uL — ABNORMAL LOW (ref 3.87–5.11)
RDW: 15.7 % — ABNORMAL HIGH (ref 11.5–15.5)
WBC Count: 5.3 10*3/uL (ref 4.0–10.5)
nRBC: 0 % (ref 0.0–0.2)

## 2019-03-15 MED ORDER — HEPARIN SOD (PORK) LOCK FLUSH 100 UNIT/ML IV SOLN
500.0000 [IU] | Freq: Once | INTRAVENOUS | Status: DC
  Filled 2019-03-15: qty 5

## 2019-03-15 MED ORDER — AMOXICILLIN-POT CLAVULANATE 875-125 MG PO TABS: 1.0000 | ORAL_TABLET | Freq: Two times a day (BID) | ORAL | 0 refills | Status: DC

## 2019-03-15 MED ORDER — LEVOFLOXACIN IN D5W 500 MG/100ML IV SOLN
500.0000 mg | Freq: Once | INTRAVENOUS | Status: DC
  Filled 2019-03-15: qty 100

## 2019-03-15 MED ORDER — SODIUM CHLORIDE 0.9% FLUSH
10.0000 mL | INTRAVENOUS | Status: DC | PRN
  Filled 2019-03-15: qty 10

## 2019-03-15 MED ORDER — SODIUM CHLORIDE 0.9 % IV SOLN
Freq: Once | INTRAVENOUS | Status: DC
  Filled 2019-03-15: qty 250

## 2019-03-15 MED FILL — AMOX-CLAV 875-125 MG TABLET: 875-125 | 7 days supply | Qty: 14 | Fill #0

## 2019-03-15 NOTE — Patient Instructions (Signed)

## 2019-03-15 NOTE — Progress Notes (Signed)
Patient presented with serosanguineous from port. Chills and tenderness as well. Denies any fever at this time. She stated she didn't know how long port had been in current condition. Notified  Lucianne Lei T, PA and he came and cultured the area. Instructed that I clean with normal saline placed gauze over the site and placed a tegaderm over the top. Sent patient to [facility].

## 2019-03-15 NOTE — Progress Notes (Signed)
Unable to get PIV access, only CBC/CMP lab draws.  PA Lucianne Lei aware, ok to d/c pt today with oral abx regimen.  Pt aware of f/u appts tomorrow w/surgeon's office to recheck port incision.  Port incision cultured by PA Lucianne Lei, redressed by [facility] LPN.  Pt aware to keep dressing on port & not get it wet until she is seen by surgeon tomorrow.  PA Lucianne Lei aware of VS.

## 2019-03-16 ENCOUNTER — Encounter: Payer: Self-pay | Admitting: Cardiothoracic Surgery

## 2019-03-16 ENCOUNTER — Ambulatory Visit (INDEPENDENT_AMBULATORY_CARE_PROVIDER_SITE_OTHER): Payer: 59 | Admitting: Cardiothoracic Surgery

## 2019-03-16 VITALS — BP 121/80 | HR 99 | Temp 97.5°F | Resp 16 | Ht 66.0 in | Wt 213.0 lb

## 2019-03-16 DIAGNOSIS — C3492 Malignant neoplasm of unspecified part of left bronchus or lung: Secondary | ICD-10-CM | POA: Diagnosis not present

## 2019-03-16 DIAGNOSIS — Z9889 Other specified postprocedural states: Secondary | ICD-10-CM

## 2019-03-16 DIAGNOSIS — Z4889 Encounter for other specified surgical aftercare: Secondary | ICD-10-CM | POA: Diagnosis not present

## 2019-03-16 DIAGNOSIS — Z5189 Encounter for other specified aftercare: Secondary | ICD-10-CM | POA: Diagnosis not present

## 2019-03-16 MED ORDER — DOXYCYCLINE HYCLATE 100 MG PO TABS: 100.0000 mg | ORAL_TABLET | Freq: Two times a day (BID) | ORAL | 0 refills | Status: DC

## 2019-03-16 MED FILL — DOXYCYCLINE HYCLATE 100 MG: 100 | 7 days supply | Qty: 14 | Fill #0

## 2019-03-16 NOTE — Progress Notes (Signed)
[facility] Progress Note   Nancy Moore [id number] 1965/12/29 54 y.o.  Nancy Moore is managed by Dr. Fanny Bien. Nancy Moore  Actively treated with chemotherapy/immunotherapy/hormonal therapy: yes  Current therapy: Alimta, carboplatin, and bevacizumab  Last treated:  02/28/2019 (cycle 2, day 1)  Next scheduled appointment with provider: 03/24/2019  Assessment: Plan:    Wound dehiscence - Plan: levofloxacin (LEVAQUIN) IVPB 500 mg, 0.9 %  sodium chloride infusion  Primary malignant neoplasm of lung with metastasis to brain ([place])   Left chest port wound dehiscence: Plans were to give the patient Levaquin 500 mg IV x1 however we were unable to establish a peripheral IV.  She was given Augmentin 875-125 p.o. twice daily x7 days.  A wound culture was collected.  The patient is being seen by Dr. Rolland Bimler tomorrow at 3 PM.  Metastatic lung cancer: The patient continues to be managed by Dr. Julien Nordmann and is status post cycle 2, day 1 of Alimta, carboplatin, and bevacizumab which was dosed on 02/28/2019.  She is scheduled to be seen in follow-up on 03/24/2019.  Please see After Visit Summary for patient specific instructions.  Future Appointments  Date Time Provider [place]  03/16/2019  3:00 PM Ivin Poot, MD [facility]  03/24/2019 11:00 AM [facility] None  03/24/2019 11:15 AM [facility] FLUSH [facility] None  03/24/2019 11:30 AM Curt Bears, MD [facility] None  03/24/2019 12:30 PM [facility] INFUSION [facility] None    No orders of the defined types were placed in this encounter.      Subjective:   Patient ID:  Nancy Moore is a 54 y.o. (DOB 12-14-1965) female.  Chief Complaint:  Chief Complaint  Patient presents with  . Vascular Access Problem    HPI Nancy Moore  Is a 54 y.o. female with a diagnosis of a metastatic lung cancer. She is managed by Dr. Julien Nordmann and is status post cycle 2, day 1 of Alimta,  carboplatin, and bevacizumab which was dosed on 02/28/2019.  She was seen today in the flush clinic when it was noted that the incision above her left chest wall Port-A-Cath was open and had been bleeding.  The patient was unable to give a time period for which she had had bleeding from her port site.  She denies fevers, chills, or sweats.  Medications: I have reviewed the patient's current medications.  Allergies:  Allergies  Allergen Reactions  . Percocet [Oxycodone-Acetaminophen] Other (See Comments)    "makes me dizzy" / takes TID at home  . Lisinopril Cough  . Losartan Cough    Past Medical History:  Diagnosis Date  . Adenocarcinoma of left lung, stage 4 ([place]) 10/28/2016  . Anemia   . Arthritis   . Constipation   . Crohn's disease ([place])    in remission 35 years +  . Dyspnea    gets "winded"  . GERD (gastroesophageal reflux disease)   . Goals of care, counseling/discussion 10/28/2016  . History of hiatal hernia    noted on CT 12/20  . Hypertension   . Pneumonia    walking pneumonia in the late 80's  . Recurrent pleural effusion on left     Past Surgical History:  Procedure Laterality Date  . ABDOMINAL HYSTERECTOMY     partial hysterectomy  . BREAST EXCISIONAL BIOPSY Left 20+ yrs ago   benign  . CHEST TUBE INSERTION Left 10/26/2018   Procedure: INSERTION PLEURAL DRAINAGE CATHETER;  Surgeon: Ivin Poot, MD;  Location: [facility];  Service: Thoracic;  Laterality: Left;  . COLONOSCOPY    . PERICARDIAL WINDOW N/A 01/10/2017   Procedure: PERICARDIAL WINDOW- SUB XYPHOID, RIGHT CHEST TUBE;  Surgeon: Ivin Poot, MD;  Location: [facility];  Service: Thoracic;  Laterality: N/A;  . PORTACATH PLACEMENT Left 02/21/2019   Procedure: INSERTION PORT-A-CATH;  Surgeon: Ivin Poot, MD;  Location: [facility];  Service: Thoracic;  Laterality: Left;  . REMOVAL OF PLEURAL DRAINAGE CATHETER Left 02/21/2019   Procedure: REMOVAL OF PLEURAL DRAINAGE CATHETER;  Surgeon: Ivin Poot, MD;   Location: [facility];  Service: Thoracic;  Laterality: Left;  Marland Kitchen VIDEO BRONCHOSCOPY Bilateral 10/21/2016   Procedure: VIDEO BRONCHOSCOPY WITH FLUORO;  Surgeon: Tanda Rockers, MD;  [facility]: [facility];  Service: Cardiopulmonary;  Laterality: Bilateral;    Family History  Problem Relation Age of Onset  . Asthma Mother   . Stroke Mother   . Hypertension Mother   . Heart attack Father   . Hypertension Father   . Hyperlipidemia Father   . Dementia Father   . Emphysema Maternal Grandmother   . Hypertension Maternal Grandmother   . Colon cancer Paternal Grandmother   . Brain cancer Maternal Uncle     Social History   Socioeconomic History  . Marital status: Married    Spouse name: Not on file  . Number of children: Not on file  . Years of education: Not on file  . Highest education level: Not on file  Occupational History  . Not on file  Tobacco Use  . Smoking status: Never Smoker  . Smokeless tobacco: Never Used  Substance and Sexual Activity  . Alcohol use: Not Currently    Comment: socially  . Drug use: No  . Sexual activity: Never  Other Topics Concern  . Not on file  Social History Narrative  . Not on file   Social Determinants of Health   Financial Resource Strain:   . Difficulty of Paying Living Expenses: Not on file  Food Insecurity:   . Worried About Charity fundraiser in the Last Year: Not on file  . Ran Out of Food in the Last Year: Not on file  Transportation Needs:   . Lack of Transportation (Medical): Not on file  . Lack of Transportation (Non-Medical): Not on file  Physical Activity:   . Days of Exercise per Week: Not on file  . Minutes of Exercise per Session: Not on file  Stress:   . Feeling of Stress : Not on file  Social Connections:   . Frequency of Communication with Friends and Family: Not on file  . Frequency of Social Gatherings with Friends and Family: Not on file  . Attends Religious Services: Not on file  . Active Member of Clubs or  Organizations: Not on file  . Attends Archivist Meetings: Not on file  . Marital Status: Not on file  Intimate Partner Violence:   . Fear of Current or Ex-Partner: Not on file  . Emotionally Abused: Not on file  . Physically Abused: Not on file  . Sexually Abused: Not on file    Past Medical History, Surgical history, Social history, and Family history were reviewed and updated as appropriate.   Please see review of systems for further details on the patient's review from today.   Review of Systems:  Review of Systems  Constitutional: Negative for activity change, chills, diaphoresis and fever.  Skin: Positive for wound. Negative for color change.  Objective:   Physical Exam:  Pulse (!) 102   Resp 20   LMP 03/15/2010  ECOG: 0  Physical Exam Constitutional:      General: She is not in acute distress.    Appearance: Normal appearance. She is not ill-appearing.  Skin:    Findings: No erythema.       Neurological:     Mental Status: She is alert.     Coordination: Coordination normal.     Gait: Gait normal.  Psychiatric:        Mood and Affect: Mood normal.        Behavior: Behavior normal.        Thought Content: Thought content normal.        Judgment: Judgment normal.     Lab Review:     Component Value Date/Time   NA 139 03/15/2019 1333   NA 135 (L) 01/08/2017 1055   K 4.5 03/15/2019 1333   K 3.2 (L) 01/08/2017 1055   CL 102 03/15/2019 1333   CO2 25 03/15/2019 1333   CO2 25 01/08/2017 1055   GLUCOSE 113 (H) 03/15/2019 1333   GLUCOSE 109 01/08/2017 1055   BUN 13 03/15/2019 1333   BUN 14.6 01/08/2017 1055   CREATININE 1.12 (H) 03/15/2019 1333   CREATININE 1.3 (H) 01/08/2017 1055   CALCIUM 9.3 03/15/2019 1333   CALCIUM 9.5 01/08/2017 1055   PROT 7.3 03/15/2019 1333   PROT 7.1 01/08/2017 1055   ALBUMIN 3.5 03/15/2019 1333   ALBUMIN 3.6 01/08/2017 1055   AST 21 03/15/2019 1333   AST 31 01/08/2017 1055   ALT 19 03/15/2019 1333   ALT 34  01/08/2017 1055   ALKPHOS 97 03/15/2019 1333   ALKPHOS 82 01/08/2017 1055   BILITOT 0.3 03/15/2019 1333   BILITOT 1.74 (H) 01/08/2017 1055   GFRNONAA 56 (L) 03/15/2019 1333   GFRAA >60 03/15/2019 1333       Component Value Date/Time   WBC 5.3 03/15/2019 1333   WBC 7.9 10/26/2018 1133   RBC 3.47 (L) 03/15/2019 1333   HGB 9.7 (L) 03/15/2019 1333   HGB 10.2 (L) 01/08/2017 1055   HCT 30.5 (L) 03/15/2019 1333   HCT 31.8 (L) 01/08/2017 1055   PLT 86 (L) 03/15/2019 1333   PLT 268 01/08/2017 1055   MCV 87.9 03/15/2019 1333   MCV 83.8 01/08/2017 1055   MCH 28.0 03/15/2019 1333   MCHC 31.8 03/15/2019 1333   RDW 15.7 (H) 03/15/2019 1333   RDW 17.1 (H) 01/08/2017 1055   LYMPHSABS 1.7 03/15/2019 1333   LYMPHSABS 1.2 01/08/2017 1055   MONOABS 1.0 03/15/2019 1333   MONOABS 1.3 (H) 01/08/2017 1055   EOSABS 0.1 03/15/2019 1333   EOSABS 0.5 01/08/2017 1055   BASOSABS 0.0 03/15/2019 1333   BASOSABS 0.1 01/08/2017 1055   -------------------------------  Imaging from last 24 hours (if applicable):  Radiology interpretation: DG Chest 1 View  Result Date: 02/21/2019 CLINICAL DATA:  Port-A-Cath placement. Stage IV lung cancer. EXAM: CHEST  1 VIEW COMPARISON:  Radiographs 02/18/2019 and 12/29/2018. CT 12/31/2018. FINDINGS: 0911 hours. Tip of the endotracheal tube is 2.1 cm above the carina. A left subclavian Port-A-Cath has been placed, projecting to the level of the lower right atrium. Left chest tube has been removed. There is no pneumothorax. Small left pleural effusion and associated left basilar pulmonary opacity are unchanged. There are increased reticulonodular densities throughout both lungs over the last 3 days, suspicious for inflammation or edema superimposed on underlying lymphangitic carcinomatosis.  IMPRESSION: 1. Interval left subclavian Port-A-Cath placement without evidence of pneumothorax. 2. Increased reticulonodular densities throughout both lungs suspicious for inflammation or  edema superimposed on underlying lymphangitic carcinomatosis. Electronically Signed   By: Richardean Sale M.D.   On: 02/21/2019 09:27   DG Chest 2 View  Result Date: 02/18/2019 CLINICAL DATA:  54 year old female with stage IV lung adenocarcinoma with miliary lung metastasis. EXAM: CHEST - 2 VIEW COMPARISON:  Chest radiograph dated 12/29/2018 and CT dated 12/31/2018. FINDINGS: A left-sided drainage tube is noted overlying the mediastinum. Diffuse bilateral interstitial prominence, left greater right most consistent with lymphangitic carcinomatosis. Superimposed infection is not excluded. There has been interval decrease in the size of the left pleural effusion. No pneumothorax. Stable cardiac silhouette. No acute osseous pathology. IMPRESSION: 1. A left-sided drainage tube overlies the mediastinum, likely a pleural drain catheter. 2. Interval decrease in the size of left pleural effusion. 3. Bilateral, left greater right, interstitial nodularity in keeping with lymphangitic carcinomatosis. Electronically Signed   By: Anner Crete M.D.   On: 02/18/2019 15:56   DG Fluoro Guide CV Line-No Report  Result Date: 02/21/2019 Fluoroscopy was utilized by the requesting physician.  No radiographic interpretation.        This case was discussed with Dr. Julien Nordmann. He expressed agreement with my management of this patient.

## 2019-03-16 NOTE — Progress Notes (Signed)
These preliminary result these preliminary results were noted.  Awaiting final report.

## 2019-03-16 NOTE — Progress Notes (Signed)
PCP is Copland, Gay Filler, MD Referring Provider is Curt Bears, MD  Chief Complaint  Patient presents with  . Lung Cancer    check port-a-cath site    HPI: Patient presents for wound check of left Port-A-Cath.  The patient had a catheter placed January 25.  She had 1 course of chemotherapy through the Port-A-Cath.  Apparently when the Steri-Strips were pulled off the incision the dermis separated.  A Gram stain and culture was taken.  No organisms few white cells noted on Gram stain.  No growth on culture.  The patient has been started on Augmentin for possible urinary tract infection.   After examining the incision in the office.  It appeared clean with some granulation tissue exposed.  I closed the dermis using 4 interrupted nylon sutures with sterile prep and drape and 1% local lidocaine.  The sutures will need to remain in place for at least a week.  I will examine the wound in 48 hours and then first part of next week.  I will place the patient on oral doxycycline. The port is below the closed incision and could probably be accessed late next week when her next infusion is scheduled. Past Medical History:  Diagnosis Date  . Adenocarcinoma of left lung, stage 4 ([place]) 10/28/2016  . Anemia   . Arthritis   . Constipation   . Crohn's disease ([place])    in remission 35 years +  . Dyspnea    gets "winded"  . GERD (gastroesophageal reflux disease)   . Goals of care, counseling/discussion 10/28/2016  . History of hiatal hernia    noted on CT 12/20  . Hypertension   . Pneumonia    walking pneumonia in the late 80's  . Recurrent pleural effusion on left     Past Surgical History:  Procedure Laterality Date  . ABDOMINAL HYSTERECTOMY     partial hysterectomy  . BREAST EXCISIONAL BIOPSY Left 20+ yrs ago   benign  . CHEST TUBE INSERTION Left 10/26/2018   Procedure: INSERTION PLEURAL DRAINAGE CATHETER;  Surgeon: Ivin Poot, MD;  Location: [facility];  Service: Thoracic;  Laterality:  Left;  . COLONOSCOPY    . PERICARDIAL WINDOW N/A 01/10/2017   Procedure: PERICARDIAL WINDOW- SUB XYPHOID, RIGHT CHEST TUBE;  Surgeon: Ivin Poot, MD;  Location: [facility];  Service: Thoracic;  Laterality: N/A;  . PORTACATH PLACEMENT Left 02/21/2019   Procedure: INSERTION PORT-A-CATH;  Surgeon: Ivin Poot, MD;  Location: [facility];  Service: Thoracic;  Laterality: Left;  . REMOVAL OF PLEURAL DRAINAGE CATHETER Left 02/21/2019   Procedure: REMOVAL OF PLEURAL DRAINAGE CATHETER;  Surgeon: Ivin Poot, MD;  Location: [facility];  Service: Thoracic;  Laterality: Left;  Marland Kitchen VIDEO BRONCHOSCOPY Bilateral 10/21/2016   Procedure: VIDEO BRONCHOSCOPY WITH FLUORO;  Surgeon: Tanda Rockers, MD;  [facility];  Service: Cardiopulmonary;  Laterality: Bilateral;    Family History  Problem Relation Age of Onset  . Asthma Mother   . Stroke Mother   . Hypertension Mother   . Heart attack Father   . Hypertension Father   . Hyperlipidemia Father   . Dementia Father   . Emphysema Maternal Grandmother   . Hypertension Maternal Grandmother   . Colon cancer Paternal Grandmother   . Brain cancer Maternal Uncle     Social History Social History   Tobacco Use  . Smoking status: Never Smoker  . Smokeless tobacco: Never Used  Substance Use Topics  . Alcohol use: Not  Currently    Comment: socially  . Drug use: No    Current Outpatient Medications  Medication Sig Dispense Refill  . albuterol (VENTOLIN HFA) 108 (90 Base) MCG/ACT inhaler Inhale 2 puffs into the lungs every 6 (six) hours as needed for wheezing or shortness of breath. 8 g 1  . amoxicillin-clavulanate (AUGMENTIN) 875-125 MG tablet Take 1 tablet by mouth 2 (two) times daily. 14 tablet 0  . clindamycin (CLINDAGEL) 1 % gel Apply 1 application topically 2 (two) times daily. 30 g 1  . dexamethasone (DECADRON) 4 MG tablet 1 tablet p.o. twice daily the day before, day of and day after chemotherapy every 3 weeks 20 tablet 2  . fluticasone  (FLONASE) 50 MCG/ACT nasal spray INSTILL 2 SPRAYS INTO BOTH NOSTRILS DAILY (Patient taking differently: Place 2 sprays into both nostrils daily as needed for allergies. ) 16 g 0  . folic acid (FOLVITE) 1 MG tablet Take 1 tablet (1 mg total) by mouth daily. 30 tablet 4  . furosemide (LASIX) 40 MG tablet TAKE 1 TABLET BY MOUTH ONCE DAILY (Patient taking differently: Take 40 mg by mouth daily. ) 90 tablet 1  . lidocaine-prilocaine (EMLA) cream Apply 1 application topically as needed. 30 g 0  . loperamide (IMODIUM) 1 MG/5ML solution Take 2 mg as needed by mouth for diarrhea or loose stools.    . magnesium oxide (MAG-OX) 400 (241.3 Mg) MG tablet Take 1 tablet (400 mg total) by mouth daily.    . methocarbamol (ROBAXIN) 500 MG tablet TAKE 1 TABLET (500 MG TOTAL) BY MOUTH EVERY 8 (EIGHT) HOURS AS NEEDED FOR MUSCLE SPASMS. 30 tablet 5  . nystatin cream (MYCOSTATIN) Apply 1 application topically 2 (two) times daily. (Patient taking differently: Apply 1 application topically daily as needed for dry skin. ) 30 g 0  . ondansetron (ZOFRAN) 8 MG tablet Take 1 tablet (8 mg total) by mouth every 8 (eight) hours as needed for nausea or vomiting. 20 tablet 0  . Oxycodone HCl 10 MG TABS TAKE 1 TABLET BY MOUTH 3 TIMES DAILY AS NEEDED 60 tablet 0  . pantoprazole (PROTONIX) 40 MG tablet TAKE 1 TABLET (40 MG TOTAL) BY MOUTH DAILY AT 12 NOON. (Patient taking differently: Take 40 mg by mouth daily. ) 90 tablet 3  . potassium chloride SA (K-DUR) 20 MEQ tablet TAKE 1 TABLET (20 MEQ TOTAL) BY MOUTH 2 TIMES DAILY. (Patient taking differently: Take 20 mEq by mouth 2 (two) times daily. ) 180 tablet 3  . senna-docusate (SENOKOT-S) 8.6-50 MG tablet Take 1 tablet by mouth at bedtime.    Marland Kitchen HYDROcodone-homatropine (HYCODAN) 5-1.5 MG/5ML syrup Take 5 mLs by mouth every 6 (six) hours as needed for cough. (Patient not taking: Reported on 02/14/2019) 240 mL 0  . prochlorperazine (COMPAZINE) 10 MG tablet Take 1 tablet (10 mg total) by mouth  every 6 (six) hours as needed for nausea or vomiting. (Patient not taking: Reported on 03/16/2019) 30 tablet 0   No current facility-administered medications for this visit.   Facility-Administered Medications Ordered in Other Visits  Medication Dose Route Frequency Provider Last Rate Last Admin  . 0.9 %  sodium chloride infusion   Intravenous Once Harle Stanford., PA-C      . levofloxacin (LEVAQUIN) IVPB 500 mg  500 mg Intravenous Once Harle Stanford., PA-C        Allergies  Allergen Reactions  . Percocet [Oxycodone-Acetaminophen] Other (See Comments)    "makes me dizzy" / takes TID at home  .  Lisinopril Cough  . Losartan Cough    Review of Systems   No fever Mild shortness of breath No drainage from the Port-A-Cath incision Pleurx incision is healed  BP 121/80 (BP Location: Left Arm, Patient Position: Sitting, Cuff Size: Normal)   Pulse 99   Temp (!) 97.5 F (36.4 C)   Resp 16   Ht 5' 6"  (1.676 m)   Wt 213 lb (96.6 kg)   LMP 03/15/2010   SpO2 95% Comment: RA  BMI 34.38 kg/m  Physical Exam Incision with dermal separation appears clean No induration or cellulitis Diagnostic Tests: None  Impression: Dermal separation of Port-A-Cath insertion site. This was cleaned, prepped, and under local anesthesia closed with interrupted 3-0 nylon sutures. Plan: Return in 48 hours for wound check and dressing change.  10 days of oral doxycycline 100 mg daily  Len Childs, MD [facility] [phone]

## 2019-03-17 ENCOUNTER — Other Ambulatory Visit: Payer: 59

## 2019-03-17 NOTE — Progress Notes (Signed)
These preliminary result these preliminary results were noted.  Awaiting final report.

## 2019-03-18 ENCOUNTER — Ambulatory Visit: Payer: 59 | Admitting: Cardiothoracic Surgery

## 2019-03-18 LAB — AEROBIC CULTURE W GRAM STAIN (SUPERFICIAL SPECIMEN): Gram Stain: NONE SEEN

## 2019-03-19 NOTE — Progress Notes (Signed)
The patient was placed on doxycycline by surgery.  According to her sensitivities both organisms are sensitive to the doxycycline.

## 2019-03-21 ENCOUNTER — Ambulatory Visit (INDEPENDENT_AMBULATORY_CARE_PROVIDER_SITE_OTHER): Payer: 59 | Admitting: Surgical

## 2019-03-21 ENCOUNTER — Other Ambulatory Visit: Payer: Self-pay

## 2019-03-21 ENCOUNTER — Other Ambulatory Visit: Payer: Self-pay | Admitting: Internal Medicine

## 2019-03-21 VITALS — BP 114/76 | HR 90 | Temp 97.5°F | Resp 20 | Ht 66.0 in | Wt 213.0 lb

## 2019-03-21 DIAGNOSIS — Z5189 Encounter for other specified aftercare: Secondary | ICD-10-CM | POA: Diagnosis not present

## 2019-03-21 DIAGNOSIS — C3492 Malignant neoplasm of unspecified part of left bronchus or lung: Secondary | ICD-10-CM

## 2019-03-21 MED FILL — ONDANSETRON HCL 8 MG TABLET: 8 | 6 days supply | Qty: 20 | Fill #0

## 2019-03-21 MED FILL — DEXAMETHASONE 4 MG TABLET: 4 | 21 days supply | Qty: 6 | Fill #1

## 2019-03-21 NOTE — Progress Notes (Signed)
      [address]       [place] [address]             [phone]     The patient is status post suture repair of superficial incision dehiscence following removal of Port-A-Cath.  This was done on 03/16/2019 by Dr. Darcey Nora.  The wound is healing well without evidence of infection.  We will have her return on Friday of this week for suture removal.

## 2019-03-22 ENCOUNTER — Telehealth: Payer: Self-pay | Admitting: *Deleted

## 2019-03-22 NOTE — Telephone Encounter (Signed)
Oncology Nurse Navigator Documentation  Oncology Nurse Navigator Flowsheets 03/22/2019  Abnormal Finding Date -  Confirmed Diagnosis Date -  Navigator Location [facility]  Referral Date to RadOnc/MedOnc -  Navigator Encounter Type Telephone/I called to check on patient.  I listened as she explained she was feeling better from the last time I saw her.  Patient is concerned about her insurance changing and is there someone she could speak to. I gave her Shanuna's number with financial advocates.   Telephone Outgoing Call  Treatment Initiated Date -  Patient Visit Type -  Treatment Phase Treatment  Barriers/Navigation Needs -  Education -  Interventions Psycho-Social Support  Acuity Level 2-Minimal Needs (1-2 Barriers Identified)  Referrals -  Coordination of Care -  Education Method -  Time Spent with Patient 30

## 2019-03-24 ENCOUNTER — Inpatient Hospital Stay: Payer: 59

## 2019-03-24 ENCOUNTER — Other Ambulatory Visit: Payer: Self-pay

## 2019-03-24 ENCOUNTER — Inpatient Hospital Stay (HOSPITAL_BASED_OUTPATIENT_CLINIC_OR_DEPARTMENT_OTHER): Payer: 59 | Admitting: Internal Medicine

## 2019-03-24 ENCOUNTER — Encounter: Payer: Self-pay | Admitting: Internal Medicine

## 2019-03-24 VITALS — BP 116/66 | HR 99 | Temp 98.5°F | Resp 20 | Ht 66.0 in | Wt 204.4 lb

## 2019-03-24 VITALS — BP 122/63

## 2019-03-24 DIAGNOSIS — C7931 Secondary malignant neoplasm of brain: Secondary | ICD-10-CM

## 2019-03-24 DIAGNOSIS — C3492 Malignant neoplasm of unspecified part of left bronchus or lung: Secondary | ICD-10-CM | POA: Diagnosis not present

## 2019-03-24 DIAGNOSIS — Z5111 Encounter for antineoplastic chemotherapy: Secondary | ICD-10-CM

## 2019-03-24 DIAGNOSIS — I10 Essential (primary) hypertension: Secondary | ICD-10-CM | POA: Diagnosis not present

## 2019-03-24 DIAGNOSIS — C3490 Malignant neoplasm of unspecified part of unspecified bronchus or lung: Secondary | ICD-10-CM

## 2019-03-24 DIAGNOSIS — Z5112 Encounter for antineoplastic immunotherapy: Secondary | ICD-10-CM | POA: Diagnosis not present

## 2019-03-24 DIAGNOSIS — Z95828 Presence of other vascular implants and grafts: Secondary | ICD-10-CM

## 2019-03-24 LAB — CBC WITH DIFFERENTIAL (CANCER CENTER ONLY)
Abs Immature Granulocytes: 0.08 10*3/uL — ABNORMAL HIGH (ref 0.00–0.07)
Basophils Absolute: 0 10*3/uL (ref 0.0–0.1)
Basophils Relative: 0 %
Eosinophils Absolute: 0 10*3/uL (ref 0.0–0.5)
Eosinophils Relative: 0 %
HCT: 35.6 % — ABNORMAL LOW (ref 36.0–46.0)
Hemoglobin: 11.2 g/dL — ABNORMAL LOW (ref 12.0–15.0)
Immature Granulocytes: 1 %
Lymphocytes Relative: 12 %
Lymphs Abs: 1.3 10*3/uL (ref 0.7–4.0)
MCH: 28 pg (ref 26.0–34.0)
MCHC: 31.5 g/dL (ref 30.0–36.0)
MCV: 89 fL (ref 80.0–100.0)
Monocytes Absolute: 2.3 10*3/uL — ABNORMAL HIGH (ref 0.1–1.0)
Monocytes Relative: 22 %
Neutro Abs: 6.8 10*3/uL (ref 1.7–7.7)
Neutrophils Relative %: 65 %
Platelet Count: 424 10*3/uL — ABNORMAL HIGH (ref 150–400)
RBC: 4 MIL/uL (ref 3.87–5.11)
RDW: 18.8 % — ABNORMAL HIGH (ref 11.5–15.5)
WBC Count: 10.5 10*3/uL (ref 4.0–10.5)
nRBC: 0 % (ref 0.0–0.2)

## 2019-03-24 LAB — CMP (CANCER CENTER ONLY)
ALT: 10 U/L (ref 0–44)
AST: 15 U/L (ref 15–41)
Albumin: 3.6 g/dL (ref 3.5–5.0)
Alkaline Phosphatase: 112 U/L (ref 38–126)
Anion gap: 14 (ref 5–15)
BUN: 9 mg/dL (ref 6–20)
CO2: 26 mmol/L (ref 22–32)
Calcium: 10 mg/dL (ref 8.9–10.3)
Chloride: 102 mmol/L (ref 98–111)
Creatinine: 0.97 mg/dL (ref 0.44–1.00)
GFR, Est AFR Am: >60 mL/min (ref >60)
GFR, Estimated: >60 mL/min (ref >60)
Glucose, Bld: 116 mg/dL — ABNORMAL HIGH (ref 70–99)
Potassium: 3.9 mmol/L (ref 3.5–5.1)
Sodium: 142 mmol/L (ref 135–145)
Total Bilirubin: 0.4 mg/dL (ref 0.3–1.2)
Total Protein: 8.1 g/dL (ref 6.5–8.1)

## 2019-03-24 LAB — TOTAL PROTEIN, URINE DIPSTICK: Protein, ur: <30 mg/dL

## 2019-03-24 MED ORDER — PALONOSETRON HCL INJECTION 0.25 MG/5ML
0.2500 mg | Freq: Once | INTRAVENOUS | Status: AC
  Administered 2019-03-24: 0.25 mg via INTRAVENOUS

## 2019-03-24 MED ORDER — ALTEPLASE 2 MG IJ SOLR
2.0000 mg | Freq: Once | INTRAMUSCULAR | Status: AC | PRN
  Administered 2019-03-24: 13:00:00 2 mg
  Filled 2019-03-24: qty 2

## 2019-03-24 MED ORDER — CYANOCOBALAMIN 1000 MCG/ML IJ SOLN
1000.0000 ug | Freq: Once | INTRAMUSCULAR | Status: AC
  Administered 2019-03-24: 1000 ug via INTRAMUSCULAR

## 2019-03-24 MED ORDER — LIDOCAINE-PRILOCAINE 2.5-2.5 % EX CREA: 1.0000 "application " | TOPICAL_CREAM | CUTANEOUS | 0 refills | Status: DC | PRN

## 2019-03-24 MED ORDER — LOPERAMIDE HCL 1 MG/5ML PO LIQD: 2.0000 mg | ORAL | 0 refills | Status: DC | PRN

## 2019-03-24 MED ORDER — CYANOCOBALAMIN 1000 MCG/ML IJ SOLN
INTRAMUSCULAR | Status: AC
  Filled 2019-03-24: qty 1

## 2019-03-24 MED ORDER — SODIUM CHLORIDE 0.9 % IV SOLN
580.0000 mg | Freq: Once | INTRAVENOUS | Status: AC
  Administered 2019-03-24: 580 mg via INTRAVENOUS
  Filled 2019-03-24: qty 58

## 2019-03-24 MED ORDER — SODIUM CHLORIDE 0.9% FLUSH
10.0000 mL | INTRAVENOUS | Status: DC | PRN
  Filled 2019-03-24: qty 10

## 2019-03-24 MED ORDER — SODIUM CHLORIDE 0.9 % IV SOLN
Freq: Once | INTRAVENOUS | Status: AC
  Filled 2019-03-24: qty 250

## 2019-03-24 MED ORDER — SODIUM CHLORIDE 0.9 % IV SOLN
500.0000 mg/m2 | Freq: Once | INTRAVENOUS | Status: AC
  Administered 2019-03-24: 1100 mg via INTRAVENOUS
  Filled 2019-03-24: qty 40

## 2019-03-24 MED ORDER — SODIUM CHLORIDE 0.9 % IV SOLN
15.2000 mg/kg | Freq: Once | INTRAVENOUS | Status: AC
  Administered 2019-03-24: 1500 mg via INTRAVENOUS
  Filled 2019-03-24: qty 48

## 2019-03-24 MED ORDER — SODIUM CHLORIDE 0.9 % IV SOLN
150.0000 mg | Freq: Once | INTRAVENOUS | Status: AC
  Administered 2019-03-24: 150 mg via INTRAVENOUS
  Filled 2019-03-24: qty 150

## 2019-03-24 MED ORDER — SODIUM CHLORIDE 0.9% FLUSH
10.0000 mL | INTRAVENOUS | Status: DC | PRN
  Administered 2019-03-24: 10 mL
  Filled 2019-03-24: qty 10

## 2019-03-24 MED ORDER — DEXAMETHASONE SODIUM PHOSPHATE 10 MG/ML IJ SOLN
INTRAMUSCULAR | Status: AC
  Filled 2019-03-24: qty 1

## 2019-03-24 MED ORDER — PALONOSETRON HCL INJECTION 0.25 MG/5ML
INTRAVENOUS | Status: AC
  Filled 2019-03-24: qty 5

## 2019-03-24 MED ORDER — DEXAMETHASONE SODIUM PHOSPHATE 10 MG/ML IJ SOLN
10.0000 mg | Freq: Once | INTRAMUSCULAR | Status: AC
  Administered 2019-03-24: 10 mg via INTRAVENOUS

## 2019-03-24 MED ORDER — HEPARIN SOD (PORK) LOCK FLUSH 100 UNIT/ML IV SOLN
500.0000 [IU] | Freq: Once | INTRAVENOUS | Status: AC | PRN
  Administered 2019-03-24: 500 [IU]
  Filled 2019-03-24: qty 5

## 2019-03-24 MED FILL — LIDOCAINE-PRILOCAINE CREAM: 2.5-2.5 | 20 days supply | Qty: 30 | Fill #0

## 2019-03-24 NOTE — Patient Instructions (Signed)
[facility] Discharge Instructions for Patients Receiving Chemotherapy  Today you received the following chemotherapy agents: bevacizumab, pemetrexed, and carboplatin.  To help prevent nausea and vomiting after your treatment, we encourage you to take your nausea medication as directed.   If you develop nausea and vomiting that is not controlled by your nausea medication, call the clinic.   BELOW ARE SYMPTOMS THAT SHOULD BE REPORTED IMMEDIATELY:  *FEVER GREATER THAN 100.5 F  *CHILLS WITH OR WITHOUT FEVER  NAUSEA AND VOMITING THAT IS NOT CONTROLLED WITH YOUR NAUSEA MEDICATION  *UNUSUAL SHORTNESS OF BREATH  *UNUSUAL BRUISING OR BLEEDING  TENDERNESS IN MOUTH AND THROAT WITH OR WITHOUT PRESENCE OF ULCERS  *URINARY PROBLEMS  *BOWEL PROBLEMS  UNUSUAL RASH Items with * indicate a potential emergency and should be followed up as soon as possible.  Feel free to call the clinic should you have any questions or concerns. The clinic phone number is [phone].  Please show the [facility] at check-in to the Emergency Department and triage nurse.

## 2019-03-24 NOTE — Progress Notes (Signed)
[facility] [phone]   [phone]  OFFICE PROGRESS NOTE  Copland, Gay Filler, MD [address] [place] [address]  DIAGNOSIS: DIAGNOSIS: stage IV (T3, N2, M1 a) non-small cell lung cancer, poorly differentiated adenocarcinoma with extensive miliary distribution in the lungs bilaterally diagnosed in September 2018. 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 NEGATIVE for the Exon 20 T790M mutation. Repeat molecular studies by guardant 360 at time of progression showed ATM [id number] [facility] B[facility] mutation, V600E  PRIOR THERAPY:  1) Gilotrif 40 mg daily started 11/08/2016.Status post 13 months of treatment. 2) Tagrisso 80 mg p.o. daily.  First dose started January 01, 2018.  Status post 12 months of treatment.   CURRENT THERAPY: 1) systemic chemotherapy with carboplatin for AUC 5, Alimta 500 mg/M2 and Avastin 15 mg/KG every 3 weeks.  First dose February 10, 2018.  Status post 2 cycles.  INTERVAL HISTORY: [name] 54y.o. female returns to the clinic today for follow-up visit.  The patient is feeling fine today with no concerning complaints except for fatigue and frequent episodes of diarrhea after starting a course of antibiotic recently for suspicious infection of the Port-A-Cath site.  She had an open wound at the Port-A-Cath site and the patient required for more stitches to close the wound.  She denied having any current chest pain but has shortness of breath with exertion with mild cough and no hemoptysis.  She denied having any recent headache or visual changes.  She has no fever or chills.  She has no current nausea or vomiting.  She has been tolerating her treatment with systemic chemotherapy and Tagrisso fairly well.  She is here today for evaluation before starting cycle #3.  MEDICAL HISTORY: Past Medical History:  Diagnosis  Date  . Adenocarcinoma of left lung, stage 4 ([place] 10/28/2016  . Anemia   . Arthritis   . Constipation   . Crohn's disease ([facility]    in remission 35 years +  . Dyspnea    gets "winded"  . GERD (gastroesophageal reflux disease)   . Goals of care, counseling/discussion 10/28/2016  . History of hiatal hernia    noted on CT 12/20  . Hypertension   . Pneumonia    walking pneumonia in the late 80's  . Recurrent pleural effusion on left     ALLERGIES:  is allergic to percocet [oxycodone-acetaminophen]; lisinopril; and losartan.  MEDICATIONS:  Current Outpatient Medications  Medication Sig Dispense Refill  . albuterol (VENTOLIN HFA) 108 (90 Base) MCG/ACT inhaler Inhale 2 puffs into the lungs every 6 (six) hours as needed for wheezing or shortness of breath. 8 g 1  . amoxicillin-clavulanate (AUGMENTIN) 875-125 MG tablet Take 1 tablet by mouth 2 (two) times daily. 14 tablet 0  . clindamycin (CLINDAGEL) 1 % gel Apply 1 application topically 2 (two) times daily. 30 g 1  . dexamethasone (DECADRON) 4 MG tablet 1 tablet p.o. twice daily the day before, day of and day after chemotherapy every 3 weeks 20 tablet 2  . doxycycline (VIBRA-TABS) 100 MG tablet Take 1 tablet (100 mg total) by mouth 2 (two) times daily. (Patient not taking: Reported on 03/21/2019) 14 tablet 0  . fluticasone (FLONASE) 50 MCG/ACT nasal spray INSTILL 2 SPRAYS INTO BOTH NOSTRILS DAILY (Patient taking differently: Place 2 sprays into both nostrils daily as needed for allergies. ) 16 g  0  . folic acid (FOLVITE) 1 MG tablet Take 1 tablet (1 mg total) by mouth daily. 30 tablet 4  . furosemide (LASIX) 40 MG tablet TAKE 1 TABLET BY MOUTH ONCE DAILY (Patient taking differently: Take 40 mg by mouth daily. ) 90 tablet 1  . HYDROcodone-homatropine (HYCODAN) 5-1.5 MG/5ML syrup Take 5 mLs by mouth every 6 (six) hours as needed for cough. 240 mL 0  . lidocaine-prilocaine (EMLA) cream Apply 1 application topically as needed. 30 g 0  . loperamide  (IMODIUM) 1 MG/5ML solution Take 2 mg as needed by mouth for diarrhea or loose stools.    . magnesium oxide (MAG-OX) 400 (241.3 Mg) MG tablet Take 1 tablet (400 mg total) by mouth daily.    . methocarbamol (ROBAXIN) 500 MG tablet TAKE 1 TABLET (500 MG TOTAL) BY MOUTH EVERY 8 (EIGHT) HOURS AS NEEDED FOR MUSCLE SPASMS. 30 tablet 5  . nystatin cream (MYCOSTATIN) Apply 1 application topically 2 (two) times daily. (Patient taking differently: Apply 1 application topically daily as needed for dry skin. ) 30 g 0  . ondansetron (ZOFRAN) 8 MG tablet TAKE 1 TABLET BY MOUTH EVERY 8 HOURS AS NEEDED FOR NAUSEA OR VOMITING 20 tablet 0  . Oxycodone HCl 10 MG TABS TAKE 1 TABLET BY MOUTH 3 TIMES DAILY AS NEEDED 60 tablet 0  . pantoprazole (PROTONIX) 40 MG tablet TAKE 1 TABLET (40 MG TOTAL) BY MOUTH DAILY AT 12 NOON. (Patient taking differently: Take 40 mg by mouth daily. ) 90 tablet 3  . potassium chloride SA (K-DUR) 20 MEQ tablet TAKE 1 TABLET (20 MEQ TOTAL) BY MOUTH 2 TIMES DAILY. (Patient taking differently: Take 20 mEq by mouth 2 (two) times daily. ) 180 tablet 3  . prochlorperazine (COMPAZINE) 10 MG tablet Take 1 tablet (10 mg total) by mouth every 6 (six) hours as needed for nausea or vomiting. 30 tablet 0  . senna-docusate (SENOKOT-S) 8.6-50 MG tablet Take 1 tablet by mouth at bedtime.     No current facility-administered medications for this visit.   Facility-Administered Medications Ordered in Other Visits  Medication Dose Route Frequency Provider Last Rate Last Admin  . sodium chloride flush (NS) 0.9 % injection 10 mL  10 mL Intracatheter PRN Curt Bears, MD        SURGICAL HISTORY:  Past Surgical History:  Procedure Laterality Date  . ABDOMINAL HYSTERECTOMY     partial hysterectomy  . BREAST EXCISIONAL BIOPSY Left 20+ yrs ago   benign  . CHEST TUBE INSERTION Left 10/26/2018   Procedure: INSERTION PLEURAL DRAINAGE CATHETER;  Surgeon: Ivin Poot, MD;  Location: [facility];  Service: Thoracic;   Laterality: Left;  . COLONOSCOPY    . PERICARDIAL WINDOW N/A 01/10/2017   Procedure: PERICARDIAL WINDOW- SUB XYPHOID, RIGHT CHEST TUBE;  Surgeon: Ivin Poot, MD;  Location: [facility];  Service: Thoracic;  Laterality: N/A;  . PORTACATH PLACEMENT Left 02/21/2019   Procedure: INSERTION PORT-A-CATH;  Surgeon: Ivin Poot, MD;  Location: [facility];  Service: Thoracic;  Laterality: Left;  . REMOVAL OF PLEURAL DRAINAGE CATHETER Left 02/21/2019   Procedure: REMOVAL OF PLEURAL DRAINAGE CATHETER;  Surgeon: Ivin Poot, MD;  Location: [facility];  Service: Thoracic;  Laterality: Left;  Marland Kitchen VIDEO BRONCHOSCOPY Bilateral 10/21/2016   Procedure: VIDEO BRONCHOSCOPY WITH FLUORO;  Surgeon: Tanda Rockers, MD;  [facility];  Service: Cardiopulmonary;  Laterality: Bilateral;    REVIEW OF SYSTEMS:  A comprehensive review of systems was negative except for: Constitutional:  positive for fatigue Respiratory: positive for dyspnea on exertion Gastrointestinal: positive for diarrhea   PHYSICAL EXAMINATION: General appearance: alert, cooperative, fatigued and no distress Head: Normocephalic, without obvious abnormality, atraumatic Neck: no adenopathy, no JVD, supple, symmetrical, trachea midline and thyroid not enlarged, symmetric, no tenderness/mass/nodules Lymph nodes: Cervical, supraclavicular, and axillary nodes normal. Resp: clear to auscultation bilaterally Back: symmetric, no curvature. ROM normal. No CVA tenderness. Cardio: regular rate and rhythm, S1, S2 normal, no murmur, click, rub or gallop GI: soft, non-tender; bowel sounds normal; no masses,  no organomegaly Extremities: extremities normal, atraumatic, no cyanosis or edema  ECOG PERFORMANCE STATUS: 1 - Symptomatic but completely ambulatory  Blood pressure 116/66, pulse 99, temperature 98.5 F (36.9 C), temperature source Temporal, resp. rate 20, height '5\' 6"'  (1.676 m), weight 204 lb 6.4 oz (92.7 kg), last menstrual period 03/15/2010, SpO2 99  %.  LABORATORY DATA: Lab Results  Component Value Date   WBC 5.3 03/15/2019   HGB 9.7 (L) 03/15/2019   HCT 30.5 (L) 03/15/2019   MCV 87.9 03/15/2019   PLT 86 (L) 03/15/2019      Chemistry      Component Value Date/Time   NA 139 03/15/2019 1333   NA 135 (L) 01/08/2017 1055   K 4.5 03/15/2019 1333   K 3.2 (L) 01/08/2017 1055   CL 102 03/15/2019 1333   CO2 25 03/15/2019 1333   CO2 25 01/08/2017 1055   BUN 13 03/15/2019 1333   BUN 14.6 01/08/2017 1055   CREATININE 1.12 (H) 03/15/2019 1333   CREATININE 1.3 (H) 01/08/2017 1055      Component Value Date/Time   CALCIUM 9.3 03/15/2019 1333   CALCIUM 9.5 01/08/2017 1055   ALKPHOS 97 03/15/2019 1333   ALKPHOS 82 01/08/2017 1055   AST 21 03/15/2019 1333   AST 31 01/08/2017 1055   ALT 19 03/15/2019 1333   ALT 34 01/08/2017 1055   BILITOT 0.3 03/15/2019 1333   BILITOT 1.74 (H) 01/08/2017 1055       RADIOGRAPHIC STUDIES: No results found.  ASSESSMENT AND PLAN: This is a very pleasant 54 years old African-American female with metastatic non-small cell lung cancer, adenocarcinoma and positive for EGFR mutation with deletion in exon 19.  The patient is currently on treatment with GILOTRIF 40 mg p.o. daily status post 13 months.  The patient is tolerating this treatment well except for few episodes of diarrhea and mild skin rash. She is complaining of dizzy spells and vertigo recently. Repeat CT scan of the chest, abdomen and pelvis performed recently showed some evidence for disease progression with increase in size and number of pulmonary nodules. Her treatment was switched to Tagrisso 80 mg p.o. daily started January 01, 2018.  She is status post 12 months of treatment The patient has been tolerating this treatment well with no concerning adverse effects. She had repeat MRI of the brain as well as CT scan of the chest, abdomen pelvis performed recently. Her MRI of the brain showed no concerning findings for disease progression in  the brain but CT scan of the chest showed some evidence for progression with lymphangitic spread of the disease. She was referred to Dr. Durenda Hurt at [facility] and repeat molecular studies showed the emergence of BRAF mutation V600E. I recommended for the patient to consider systemic chemotherapy and she is here today for evaluation and discussion of this option.  I had a lengthy discussion with the patient today regarding her condition.  She understands that a combination of  BRAF inhibitor and Tagrisso has not been well studied at this point. She is currently undergoing systemic chemotherapy with carboplatin for AUC of 5, Alimta 500 mg/M2 and Avastin 15 mg/KG every 3 weeks, status post 2 cycles.  She also continues her current treatment with Tagrisso 80 mg p.o. daily. The patient has been tolerating this treatment well with no concerning adverse effects. I recommended for her to proceed with cycle #3 today as planned. I will see her back for follow-up visit in 3 weeks for evaluation with repeat CT scan of the chest, abdomen pelvis as well as MRI of the brain. For the diarrhea she will continue her current treatment with Imodium for now. For the nausea, she will continue on Zofran 8 mg p.o. every 8 hours as needed.  She will also alternate with Compazine if needed. For the pain management, she will continue her current treatment with oxycodone as needed. The patient was advised to call immediately if she has any concerning symptoms in the interval. The patient voices understanding of current disease status and treatment options and is in agreement with the current care plan. All questions were answered. The patient knows to call the clinic with any problems, questions or concerns. We can certainly see the patient much sooner if necessary.  Disclaimer: This note was dictated with voice recognition software. Similar sounding words can inadvertently be transcribed and may not be corrected upon  review.

## 2019-03-25 ENCOUNTER — Ambulatory Visit (INDEPENDENT_AMBULATORY_CARE_PROVIDER_SITE_OTHER): Payer: 59

## 2019-03-25 ENCOUNTER — Telehealth: Payer: Self-pay | Admitting: Internal Medicine

## 2019-03-25 DIAGNOSIS — Z4802 Encounter for removal of sutures: Secondary | ICD-10-CM

## 2019-03-25 NOTE — Telephone Encounter (Signed)
Scheduled per los. Called and spoke with patient. Confirmed appts

## 2019-03-25 NOTE — Progress Notes (Signed)
Removed 4 sutures from incision site with no signs of infection and patient tolerated well.

## 2019-04-01 MED FILL — PANTOPRAZOLE SOD DR 40 MG T: 40 | 30 days supply | Qty: 30 | Fill #6

## 2019-04-01 MED FILL — FOLIC ACID 1 MG TABS: 1 | 30 days supply | Qty: 30 | Fill #2

## 2019-04-01 MED FILL — POTASSIUM CHLORIDE CRYS ER: 20 | 30 days supply | Qty: 60 | Fill #7

## 2019-04-01 MED FILL — FUROSEMIDE 40 MG TAB: 40 | 30 days supply | Qty: 30 | Fill #3

## 2019-04-05 ENCOUNTER — Telehealth: Payer: Self-pay | Admitting: Internal Medicine

## 2019-04-05 NOTE — Telephone Encounter (Signed)
I left a message regarding 3/19

## 2019-04-08 MED FILL — DEXAMETHASONE 4 MG TABLET: 4 | 21 days supply | Qty: 6 | Fill #2

## 2019-04-12 ENCOUNTER — Inpatient Hospital Stay: Payer: Medicare Other | Attending: Oncology

## 2019-04-12 ENCOUNTER — Telehealth: Payer: Self-pay | Admitting: Emergency Medicine

## 2019-04-12 ENCOUNTER — Other Ambulatory Visit: Payer: Self-pay

## 2019-04-12 ENCOUNTER — Ambulatory Visit (HOSPITAL_COMMUNITY)
Admission: RE | Admit: 2019-04-12 | Discharge: 2019-04-12 | Disposition: A | Discharge status: Home / Self Care | Payer: Medicare Other | Source: Ambulatory Visit | Attending: Internal Medicine | Admitting: Internal Medicine

## 2019-04-12 DIAGNOSIS — Z79899 Other long term (current) drug therapy: Secondary | ICD-10-CM | POA: Insufficient documentation

## 2019-04-12 DIAGNOSIS — C3490 Malignant neoplasm of unspecified part of unspecified bronchus or lung: Secondary | ICD-10-CM | POA: Diagnosis present

## 2019-04-12 DIAGNOSIS — Z95828 Presence of other vascular implants and grafts: Secondary | ICD-10-CM

## 2019-04-12 DIAGNOSIS — Z7952 Long term (current) use of systemic steroids: Secondary | ICD-10-CM | POA: Insufficient documentation

## 2019-04-12 DIAGNOSIS — Z5112 Encounter for antineoplastic immunotherapy: Secondary | ICD-10-CM | POA: Insufficient documentation

## 2019-04-12 DIAGNOSIS — C3492 Malignant neoplasm of unspecified part of left bronchus or lung: Secondary | ICD-10-CM | POA: Insufficient documentation

## 2019-04-12 DIAGNOSIS — Z5111 Encounter for antineoplastic chemotherapy: Secondary | ICD-10-CM | POA: Insufficient documentation

## 2019-04-12 DIAGNOSIS — K5090 Crohn's disease, unspecified, without complications: Secondary | ICD-10-CM | POA: Insufficient documentation

## 2019-04-12 DIAGNOSIS — I10 Essential (primary) hypertension: Secondary | ICD-10-CM | POA: Insufficient documentation

## 2019-04-12 DIAGNOSIS — Z90710 Acquired absence of both cervix and uterus: Secondary | ICD-10-CM | POA: Insufficient documentation

## 2019-04-12 MED ORDER — SODIUM CHLORIDE 0.9% FLUSH
10.0000 mL | INTRAVENOUS | Status: DC | PRN
  Filled 2019-04-12: qty 10

## 2019-04-12 MED ORDER — SODIUM CHLORIDE (PF) 0.9 % IJ SOLN
INTRAMUSCULAR | Status: AC
  Filled 2019-04-12: qty 50

## 2019-04-12 MED ORDER — IOHEXOL 300 MG/ML  SOLN
100.0000 mL | Freq: Once | INTRAMUSCULAR | Status: AC | PRN
  Administered 2019-04-12: 100 mL via INTRAVENOUS

## 2019-04-12 NOTE — Patient Instructions (Addendum)
It was great to see you again today-  I am so sorry that you had this abscess. We drained it for you today which should help a lot Please use warm soaks- bath or shower- 2-3x a day for the next several days to encourage drainage  Take augmentin antibiotic twice a day for 7- 10 days  I made you an appt to see me tomorrow at 1pm for a recheck if needed  If you are feeling worse in the meantime please seek immediate emergency care

## 2019-04-12 NOTE — Progress Notes (Signed)
Pt's port assessed by PA Lucianne Lei during flush appt.  D/t scar tissue around port and healing incision after stitches removal PA recommended that pt have CT scan done w/PIV.  Sherri in CT made aware, stated that PIV could be started at CT appt.  PIV started in [facility] per pt request.  Pt transported via w/c with belongings to CT appt.  Alerted staff in [place] CT that pt has 22 G PIV in RFA ready for use.

## 2019-04-12 NOTE — Progress Notes (Signed)
[facility] [address], [place], [place] [address] [phone]  Date:  04/13/2019   Name:  Nancy Moore   DOB:  03-Apr-1965   MRN:  [id number]  PCP:  Darreld Mclean, MD    Chief Complaint: Recurrent Skin Infections (one week, upper thigh by buttocks)   History of Present Illness:  Nancy Moore is a 54 y.o. very pleasant female patient who presents with the following:  Pt with history of metastatic lung cancer-she has been battling this for several years She had repeat scans yesterday which showed stable to slight progression of disease in her lungs, with improved bilateral pleural effusions  Here today with concern of a possible boil on her left buttock, present for about a week and getting worse.  It is not draining, it is painful She was seen by her oncologist, Dr. Earlie Server in February She is currently being treated with Gilotrif 40 mg daily She is starting to do worse with her lung cancer- she is feeling really tired, she is unable to work, she is often nauseated   I have been managing her chronic pain, as follows She is using miralax for associated constipation with some success  No fever or chills    03/02/2019  2   03/02/2019  Oxycodone Hcl 10 MG Tablet  60.00  20 Je Cop   [id number]   Wes ([id number])   0  45.00 MME  Comm Ins   [place]  01/13/2019  3   01/13/2019  Hydrocodone-Homatropine Soln  120.00  6 Mo Moh   [id number]   Wes ([id number])   0  20.00 MME  Comm Ins   [place]  12/30/2018  2   12/30/2018  Oxycodone Hcl 10 MG Tablet  60.00  20 Je Cop   [id number]   [place]es ([id number])   0  45.00 MME  Comm Ins   [place]  12/02/2018  2   12/02/2018  Oxycodone Hcl 10 MG Tablet  60.00  20 Je Cop   [id number]   Wes ([id number])   0  45.00 MME  Comm Ins   [place]  09/24/2018  2   09/24/2018  Oxycodone Hcl 10 MG Tablet  60.00  20 Je Cop   [id number]   Wes (0126)   0  45.00 MME      Status post hysterectomy, ask for details- this was done at age 12 or so. Done for benign disease     Patient Active Problem List   Diagnosis Date Noted  . Port-A-Cath in place 03/24/2019  . Postoperative visit 03/16/2019  . Encounter for post surgical wound check 10/28/2018  . Pleural effusion 10/13/2018  . Brain metastases ([place]) 05/13/2018  . Primary malignant neoplasm of lung with metastasis to brain ([place]) 02/05/2018  . Cardiac/pericardial tamponade   . Chest tube in place   . Pain   . Acute blood loss anemia   . Palliative care encounter   . AKI (acute kidney injury) ([place]) 01/10/2017  . Hypotension 01/09/2017  . Essential hypertension 01/09/2017  . Hypokalemia 01/09/2017  . Dyspnea 01/09/2017  . Leukocytosis 01/09/2017  . Pleural effusion on right 01/08/2017  . Encounter for antineoplastic chemotherapy 11/20/2016  . Adenocarcinoma of left lung, stage 4 ([place]) 10/28/2016  . Goals of care, counseling/discussion 10/28/2016  . Upper airway cough syndrome 10/16/2016  . Multiple pulmonary nodules 10/15/2016    Past Medical History:  Diagnosis Date  . Adenocarcinoma of  left lung, stage 4 ([place]) 10/28/2016  . Anemia   . Arthritis   . Constipation   . Crohn's disease ([place])    in remission 35 years +  . Dyspnea    gets "winded"  . GERD (gastroesophageal reflux disease)   . Goals of care, counseling/discussion 10/28/2016  . History of hiatal hernia    noted on CT 12/20  . Hypertension   . Pneumonia    walking pneumonia in the late 80's  . Recurrent pleural effusion on left     Past Surgical History:  Procedure Laterality Date  . ABDOMINAL HYSTERECTOMY     partial hysterectomy  . BREAST EXCISIONAL BIOPSY Left 20+ yrs ago   benign  . CHEST TUBE INSERTION Left 10/26/2018   Procedure: INSERTION PLEURAL DRAINAGE CATHETER;  Surgeon: Ivin Poot, MD;  Location: [facility];  Service: Thoracic;  Laterality: Left;  . COLONOSCOPY    . PERICARDIAL WINDOW N/A 01/10/2017   Procedure: PERICARDIAL WINDOW- SUB XYPHOID, RIGHT CHEST TUBE;  Surgeon: Ivin Poot, MD;  Location: [facility];  Service: Thoracic;  Laterality: N/A;  . PORTACATH PLACEMENT Left 02/21/2019   Procedure: INSERTION PORT-A-CATH;  Surgeon: Ivin Poot, MD;  Location: [facility];  Service: Thoracic;  Laterality: Left;  . REMOVAL OF PLEURAL DRAINAGE CATHETER Left 02/21/2019   Procedure: REMOVAL OF PLEURAL DRAINAGE CATHETER;  Surgeon: Ivin Poot, MD;  Location: [facility];  Service: Thoracic;  Laterality: Left;  Marland Kitchen VIDEO BRONCHOSCOPY Bilateral 10/21/2016   Procedure: VIDEO BRONCHOSCOPY WITH FLUORO;  Surgeon: Tanda Rockers, MD;  [facility];  Service: Cardiopulmonary;  Laterality: Bilateral;    Social History   Tobacco Use  . Smoking status: Never Smoker  . Smokeless tobacco: Never Used  Substance Use Topics  . Alcohol use: Not Currently    Comment: socially  . Drug use: No    Family History  Problem Relation Age of Onset  . Asthma Mother   . Stroke Mother   . Hypertension Mother   . Heart attack Father   . Hypertension Father   . Hyperlipidemia Father   . Dementia Father   . Emphysema Maternal Grandmother   . Hypertension Maternal Grandmother   . Colon cancer Paternal Grandmother   . Brain cancer Maternal Uncle     Allergies  Allergen Reactions  . Percocet [Oxycodone-Acetaminophen] Other (See Comments)    "makes me dizzy" / takes TID at home  . Lisinopril Cough  . Losartan Cough    Medication list has been reviewed and updated.  Current Outpatient Medications on File Prior to Visit  Medication Sig Dispense Refill  . albuterol (VENTOLIN HFA) 108 (90 Base) MCG/ACT inhaler Inhale 2 puffs into the lungs every 6 (six) hours as needed for wheezing or shortness of breath. 8 g 1  . clindamycin (CLINDAGEL) 1 % gel Apply 1 application topically 2 (two) times daily. 30 g 1  . dexamethasone (DECADRON) 4 MG tablet 1 tablet p.o. twice daily the day before, day of and day after chemotherapy every 3 weeks 20 tablet 2  . fluticasone (FLONASE) 50 MCG/ACT nasal spray INSTILL 2 SPRAYS INTO  BOTH NOSTRILS DAILY (Patient taking differently: Place 2 sprays into both nostrils daily as needed for allergies. ) 16 g 0  . folic acid (FOLVITE) 1 MG tablet Take 1 tablet (1 mg total) by mouth daily. 30 tablet 4  . furosemide (LASIX) 40 MG tablet TAKE 1 TABLET BY MOUTH ONCE DAILY (Patient taking differently: Take 40 mg  by mouth daily. ) 90 tablet 1  . HYDROcodone-homatropine (HYCODAN) 5-1.5 MG/5ML syrup Take 5 mLs by mouth every 6 (six) hours as needed for cough. 240 mL 0  . lidocaine-prilocaine (EMLA) cream Apply 1 application topically as needed. 30 g 0  . loperamide (IMODIUM) 1 MG/5ML solution Take 10 mLs (2 mg total) by mouth as needed for diarrhea or loose stools. 120 mL 0  . magnesium oxide (MAG-OX) 400 (241.3 Mg) MG tablet Take 1 tablet (400 mg total) by mouth daily.    . methocarbamol (ROBAXIN) 500 MG tablet TAKE 1 TABLET (500 MG TOTAL) BY MOUTH EVERY 8 (EIGHT) HOURS AS NEEDED FOR MUSCLE SPASMS. 30 tablet 5  . ondansetron (ZOFRAN) 8 MG tablet TAKE 1 TABLET BY MOUTH EVERY 8 HOURS AS NEEDED FOR NAUSEA OR VOMITING 20 tablet 0  . Oxycodone HCl 10 MG TABS TAKE 1 TABLET BY MOUTH 3 TIMES DAILY AS NEEDED 60 tablet 0  . pantoprazole (PROTONIX) 40 MG tablet TAKE 1 TABLET (40 MG TOTAL) BY MOUTH DAILY AT 12 NOON. (Patient taking differently: Take 40 mg by mouth daily. ) 90 tablet 3  . potassium chloride SA (K-DUR) 20 MEQ tablet TAKE 1 TABLET (20 MEQ TOTAL) BY MOUTH 2 TIMES DAILY. (Patient taking differently: Take 20 mEq by mouth 2 (two) times daily. ) 180 tablet 3  . prochlorperazine (COMPAZINE) 10 MG tablet Take 1 tablet (10 mg total) by mouth every 6 (six) hours as needed for nausea or vomiting. 30 tablet 0  . senna-docusate (SENOKOT-S) 8.6-50 MG tablet Take 1 tablet by mouth at bedtime.    . [DISCONTINUED] TAGRISSO 80 MG tablet TAKE 1 TABLET BY MOUTH ONCE DAILY 30 tablet 0   No current facility-administered medications on file prior to visit.    Review of Systems:  As per HPI- otherwise  negative.   Physical Examination: Vitals:   04/13/19 1311 04/13/19 1403  BP: 111/74   Pulse: (!) 114 96  Resp: 20   Temp: (!) 96.9 F (36.1 C)   SpO2: 94%    Vitals:   04/13/19 1311  Weight: 202 lb (91.6 kg)  Height: 5' 6"  (1.676 m)   Body mass index is 32.6 kg/m. Ideal Body Weight: Weight in (lb) to have BMI = 25: 154.6  GEN: no acute distress.  Nancy Moore appears more chronically ill than she typically does, consistent with advanced cancer HEENT: Atraumatic, Normocephalic.  Ears and Nose: No external deformity. CV: RRR, No M/G/R. No JVD. No thrill. No extra heart sounds. PULM: CTA B, no wheezes, crackles, rhonchi. No retractions. No resp. distress. No accessory muscle use. ABD: S, NT, ND, +BS. No rebound. No HSM. EXTR: No c/c/e PSYCH: Normally interactive. Conversant.  There is tenderness and mild fluctuance of the left buttock near the anus/ gluteal cleft   Pulse Readings from Last 3 Encounters:  04/13/19 96  03/24/19 99  03/21/19 90   BP Readings from Last 3 Encounters:  04/13/19 111/74  03/24/19 122/63  03/24/19 116/66    VC obtained Area prepped with Betadine, anesthesia achieved with 5 cc of 1% lidocaine 11 blade used to incise over fluctuant area, copious pus expressed The wound was not packed, dressing applied Patient tolerated the procedure well, she is observed in clinic for about 20 minutes afterwards to make sure she felt okay Estimated blood loss 5 mils  Assessment and Plan: Abscess of anal and rectal regions - Plan: amoxicillin-clavulanate (AUGMENTIN) 875-125 MG tablet  Patient with history of metastatic lung cancer, here today  with an abscess of the left buttock This area was incised and drained successfully as above We will start her on Augmentin, discussed wound care and hot water soaks a few times a day for the next several days Although she does appear chronically ill, she does not show signs of sepsis.  However, I have advised her that sepsis  is possible with infection of this sort.  Drainage of pus is the first step in treatment.  However, if she starts to feel worse overnight, has fever or flulike symptoms she should seek emergency care.  She agrees to do so We made an appointment for a wound check tomorrow  This patient does need a urine drug screen as I am treating her with oxycodone for her metastatic cancer pain.  We has planned to do this today, but I decided not to as we had just drained her pilonidal abscess  This visit occurred during the SARS-CoV-2 public health emergency.  Safety protocols were in place, including screening questions prior to the visit, additional usage of staff PPE, and extensive cleaning of exam room while observing appropriate contact time as indicated for disinfecting solutions.    Signed Lamar Blinks, MD

## 2019-04-12 NOTE — Patient Instructions (Signed)
COVID-19: How to Protect Yourself and Others Know how it spreads  There is currently no vaccine to prevent coronavirus disease 2019 (COVID-19).  The [place] way to prevent illness is to avoid being exposed to this virus.  The virus is thought to spread mainly from person-to-person. ? Between people who are in close contact with one another (within about 6 feet). ? Through respiratory droplets produced when an infected person coughs, sneezes or talks. ? These droplets can land in the mouths or noses of people who are nearby or possibly be inhaled into the lungs. ? COVID-19 may be spread by people who are not showing symptoms. Everyone should Clean your hands often  Wash your hands often with soap and water for at least 20 seconds especially after you have been in a public place, or after blowing your nose, coughing, or sneezing.  If soap and water are not readily available, use a hand sanitizer that contains at least 60% alcohol. Cover all surfaces of your hands and rub them together until they feel dry.  Avoid touching your eyes, nose, and mouth with unwashed hands. Avoid close contact  Limit contact with others as much as possible.  Avoid close contact with people who are sick.  Put distance between yourself and other people. ? Remember that some people without symptoms may be able to spread virus. ? This is especially important for people who are at higher risk of getting very sick.www.cdc.gov/coronavirus/2019-ncov/need-extra-precautions/people-at-higher-risk.html Cover your mouth and nose with a mask when around others  You could spread COVID-19 to others even if you do not feel sick.  Everyone should wear a mask in public settings and when around people not living in their household, especially when social distancing is difficult to maintain. ? Masks should not be placed on young children under age 2, anyone who has trouble breathing, or is unconscious, incapacitated or otherwise  unable to remove the mask without assistance.  The mask is meant to protect other people in case you are infected.  Do NOT use a facemask meant for a healthcare worker.  Continue to keep about 6 feet between yourself and others. The mask is not a substitute for social distancing. Cover coughs and sneezes  Always cover your mouth and nose with a tissue when you cough or sneeze or use the inside of your elbow.  Throw used tissues in the trash.  Immediately wash your hands with soap and water for at least 20 seconds. If soap and water are not readily available, clean your hands with a hand sanitizer that contains at least 60% alcohol. Clean and disinfect  Clean AND disinfect frequently touched surfaces daily. This includes tables, doorknobs, light switches, countertops, handles, desks, phones, keyboards, toilets, faucets, and sinks. www.cdc.gov/coronavirus/2019-ncov/prevent-getting-sick/disinfecting-your-home.html  If surfaces are dirty, clean them: Use detergent or soap and water prior to disinfection.  Then, use a household disinfectant. You can see a list of EPA-registered household disinfectants here. cdc.gov/coronavirus 09/29/2018 This information is not intended to replace advice given to you by your health care provider. Make sure you discuss any questions you have with your health care provider. Document Revised: 10/07/2018 Document Reviewed: 08/05/2018 [employer] Patient Education  2020 [employer]   

## 2019-04-12 NOTE — Telephone Encounter (Signed)
Spoke with Sherri in [place].  Confirmed pt does not need labs for CT appt today, only needs her port accessed with a power port needle.  Also confirmed that pt will be deaccessed by CT dept at end of appt.

## 2019-04-13 ENCOUNTER — Other Ambulatory Visit: Payer: Self-pay

## 2019-04-13 ENCOUNTER — Ambulatory Visit (INDEPENDENT_AMBULATORY_CARE_PROVIDER_SITE_OTHER): Payer: Medicare Other | Admitting: Family Medicine

## 2019-04-13 ENCOUNTER — Encounter: Payer: Self-pay | Admitting: Family Medicine

## 2019-04-13 VITALS — BP 111/74 | HR 96 | Temp 96.9°F | Resp 20 | Ht 66.0 in | Wt 202.0 lb

## 2019-04-13 DIAGNOSIS — K612 Anorectal abscess: Secondary | ICD-10-CM | POA: Diagnosis not present

## 2019-04-13 MED ORDER — NYSTATIN 100000 UNIT/GM EX CREA: 1.0000 "application " | TOPICAL_CREAM | Freq: Two times a day (BID) | CUTANEOUS | 0 refills | Status: DC

## 2019-04-13 MED ORDER — AMOXICILLIN-POT CLAVULANATE 875-125 MG PO TABS: 1.0000 | ORAL_TABLET | Freq: Two times a day (BID) | ORAL | 0 refills | Status: DC

## 2019-04-13 MED FILL — NYSTATIN 100,000 UNIT/GM CR: 100000 | 15 days supply | Qty: 30 | Fill #0

## 2019-04-13 MED FILL — AMOX-CLAV 875-125 MG TABLET: 875-125 | 10 days supply | Qty: 20 | Fill #0

## 2019-04-14 ENCOUNTER — Other Ambulatory Visit: Payer: 59

## 2019-04-14 ENCOUNTER — Ambulatory Visit: Payer: 59

## 2019-04-14 ENCOUNTER — Ambulatory Visit (HOSPITAL_COMMUNITY)
Admission: RE | Admit: 2019-04-14 | Discharge: 2019-04-14 | Disposition: A | Discharge status: Home / Self Care | Payer: Medicare Other | Source: Ambulatory Visit | Attending: Internal Medicine | Admitting: Internal Medicine

## 2019-04-14 ENCOUNTER — Other Ambulatory Visit: Payer: Self-pay | Admitting: Family Medicine

## 2019-04-14 ENCOUNTER — Ambulatory Visit: Payer: 59 | Admitting: Internal Medicine

## 2019-04-14 ENCOUNTER — Ambulatory Visit: Payer: 59 | Admitting: Family Medicine

## 2019-04-14 DIAGNOSIS — C3490 Malignant neoplasm of unspecified part of unspecified bronchus or lung: Secondary | ICD-10-CM | POA: Diagnosis not present

## 2019-04-14 DIAGNOSIS — K5904 Chronic idiopathic constipation: Secondary | ICD-10-CM

## 2019-04-14 MED ORDER — GADOBUTROL 1 MMOL/ML IV SOLN
10.0000 mL | Freq: Once | INTRAVENOUS | Status: AC | PRN
  Administered 2019-04-14: 10 mL via INTRAVENOUS

## 2019-04-14 MED ORDER — LINACLOTIDE 145 MCG PO CAPS: 145.0000 ug | ORAL_CAPSULE | Freq: Every day | ORAL | 6 refills | Status: DC

## 2019-04-14 NOTE — Progress Notes (Signed)
[facility] OFFICE PROGRESS NOTE  Copland, Gay Filler, MD [address] [place] [address]  DIAGNOSIS: Stage IV (T3, N2, M1 a) non-small cell lung cancer, poorly differentiated adenocarcinoma with extensive miliary distribution in the lungs bilaterally diagnosed in September 2018. 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 NEGATIVE for the Exon 20 T790M mutation. Repeat molecular studies by guardant 360 at time of progression showed ATM [id number] [facility], V600E  PRIOR THERAPY: 1) Gilotrif 40 mg daily started 11/08/2016.Status post185monthof treatment. 2) Tagrisso 80 mg p.o. daily.  First dose started January 01, 2018.  Status post 12 months of treatment.  CURRENT THERAPY: Systemic chemotherapy with carboplatin for AUC 5, Alimta 500 mg/M2 and Avastin 15 mg/KG every 3 weeks.  First dose February 10, 2018.  Status post 3 cycles.   INTERVAL HISTORY: Nancy [name] [age].o. female returns to the clinic for a follow up visit. The patient is feeling fair today. She saw her PCP recently for an abscess on her buttock who prescribed Augmentin. She is having some discomfort from this. She also notes a band like intermittent discomfort near the anterior region of her diaphragm.  She denies any fevers, chills, or night sweats. She denied chest pain, hemoptysis. Reports dyspnea on exertion. Reports frequent nausea. She experiences diarrhea as well as constipation. She tends to be constipation more often though. Her last bowel movement was about 3 days ago. She is planning on taking stool softeners and miralax upon returning home today. She denies any recent headaches or visual changes. She recently had a restaging CT scan of the chest, abdomen, and pelvis. She also had a repeat brain MRI. She is here today for evaluation and to review her scans before starting cycle #4.    MEDICAL HISTORY: Past Medical History:  Diagnosis Date  . Adenocarcinoma of left lung, stage 4 ([facility]10/02/2016  . Anemia   . Arthritis   . Constipation   . Crohn's disease (H[facility]eoria   in remission 35 years +  . Dyspnea    gets "winded"  . GERD (gastroesophageal reflux disease)   . Goals of care, counseling/discussion 10/28/2016  . History of hiatal hernia    noted on CT 12/20  . Hypertension   . Pneumonia    walking pneumonia in the late 80's  . Recurrent pleural effusion on left     ALLERGIES:  is allergic to percocet [oxycodone-acetaminophen]; lisinopril; and losartan.  MEDICATIONS:  Current Outpatient Medications  Medication Sig Dispense Refill  . albuterol (VENTOLIN HFA) 108 (90 Base) MCG/ACT inhaler Inhale 2 puffs into the lungs every 6 (six) hours as needed for wheezing or shortness of breath. 8 g 1  . amoxicillin-clavulanate (AUGMENTIN) 875-125 MG tablet Take 1 tablet by mouth 2 (two) times daily. 20 tablet 0  . dexamethasone (DECADRON) 4 MG tablet 1 tablet p.o. twice daily the day before, day of and day after chemotherapy every 3 weeks 20 tablet 2  . fluticasone (FLONASE) 50 MCG/ACT nasal spray INSTILL 2 SPRAYS INTO BOTH NOSTRILS DAILY (Patient taking differently: Place 2 sprays into both nostrils daily as needed for allergies. ) 16 g 0  . folic acid (FOLVITE) 1 MG tablet Take 1 tablet (1 mg total) by mouth daily. 30 tablet 4  . furosemide (LASIX) 40 MG tablet TAKE 1 TABLET BY MOUTH ONCE DAILY (Patient taking differently: Take 40 mg by  mouth daily. ) 90 tablet 1  . guaiFENesin (MUCINEX) 600 MG 12 hr tablet Take by mouth 2 (two) times daily.    Marland Kitchen linaclotide (LINZESS) 145 MCG CAPS capsule Take 1 capsule (145 mcg total) by mouth daily before breakfast. 30 capsule 6  . magnesium oxide (MAG-OX) 400 (241.3 Mg) MG tablet Take 1 tablet (400 mg total) by mouth daily.    Marland Kitchen nystatin cream (MYCOSTATIN) Apply 1 application topically 2 (two) times daily. 30 g 0  . ondansetron (ZOFRAN)  8 MG tablet TAKE 1 TABLET BY MOUTH EVERY 8 HOURS AS NEEDED FOR NAUSEA OR VOMITING 30 tablet 2  . pantoprazole (PROTONIX) 40 MG tablet TAKE 1 TABLET (40 MG TOTAL) BY MOUTH DAILY AT 12 NOON. (Patient taking differently: Take 40 mg by mouth daily. ) 90 tablet 3  . potassium chloride SA (K-DUR) 20 MEQ tablet TAKE 1 TABLET (20 MEQ TOTAL) BY MOUTH 2 TIMES DAILY. (Patient taking differently: Take 20 mEq by mouth 2 (two) times daily. ) 180 tablet 3  . prochlorperazine (COMPAZINE) 10 MG tablet Take 1 tablet (10 mg total) by mouth every 6 (six) hours as needed for nausea or vomiting. 30 tablet 2  . senna-docusate (SENOKOT-S) 8.6-50 MG tablet Take 1 tablet by mouth at bedtime.    . clindamycin (CLINDAGEL) 1 % gel Apply 1 application topically 2 (two) times daily. (Patient not taking: Reported on 04/15/2019) 30 g 1  . HYDROcodone-homatropine (HYCODAN) 5-1.5 MG/5ML syrup Take 5 mLs by mouth every 6 (six) hours as needed for cough. (Patient not taking: Reported on 04/15/2019) 240 mL 0  . lidocaine-prilocaine (EMLA) cream Apply 1 application topically as needed. (Patient not taking: Reported on 04/15/2019) 30 g 0  . loperamide (IMODIUM) 1 MG/5ML solution Take 10 mLs (2 mg total) by mouth as needed for diarrhea or loose stools. (Patient not taking: Reported on 04/15/2019) 120 mL 0  . methocarbamol (ROBAXIN) 500 MG tablet TAKE 1 TABLET (500 MG TOTAL) BY MOUTH EVERY 8 (EIGHT) HOURS AS NEEDED FOR MUSCLE SPASMS. (Patient not taking: Reported on 04/15/2019) 30 tablet 5  . Oxycodone HCl 10 MG TABS Take 1 tablet (10 mg total) by mouth 3 (three) times daily as needed. 30 tablet 0   No current facility-administered medications for this visit.   Facility-Administered Medications Ordered in Other Visits  Medication Dose Route Frequency Provider Last Rate Last Admin  . CARBOplatin (PARAPLATIN) 610 mg in sodium chloride 0.9 % 250 mL chemo infusion  610 mg Intravenous Once Curt Bears, MD      . PEMEtrexed (ALIMTA) 1,100 mg in  sodium chloride 0.9 % 100 mL chemo infusion  500 mg/m2 (Treatment Plan Recorded) Intravenous Once Curt Bears, MD 864 mL/hr at 04/15/19 1456 1,100 mg at 04/15/19 1456    SURGICAL HISTORY:  Past Surgical History:  Procedure Laterality Date  . ABDOMINAL HYSTERECTOMY     partial hysterectomy  . BREAST EXCISIONAL BIOPSY Left 20+ yrs ago   benign  . CHEST TUBE INSERTION Left 10/26/2018   Procedure: INSERTION PLEURAL DRAINAGE CATHETER;  Surgeon: Ivin Poot, MD;  Location: [facility];  Service: Thoracic;  Laterality: Left;  . COLONOSCOPY    . PERICARDIAL WINDOW N/A 01/10/2017   Procedure: PERICARDIAL WINDOW- SUB XYPHOID, RIGHT CHEST TUBE;  Surgeon: Ivin Poot, MD;  Location: [facility];  Service: Thoracic;  Laterality: N/A;  . PORTACATH PLACEMENT Left 02/21/2019   Procedure: INSERTION PORT-A-CATH;  Surgeon: Ivin Poot, MD;  Location: [facility];  Service: Thoracic;  Laterality:  Left;  . REMOVAL OF PLEURAL DRAINAGE CATHETER Left 02/21/2019   Procedure: REMOVAL OF PLEURAL DRAINAGE CATHETER;  Surgeon: Ivin Poot, MD;  Location: [facility];  Service: Thoracic;  Laterality: Left;  Marland Kitchen VIDEO BRONCHOSCOPY Bilateral 10/21/2016   Procedure: VIDEO BRONCHOSCOPY WITH FLUORO;  Surgeon: Tanda Rockers, MD;  Location: [facility];  Service: Cardiopulmonary;  Laterality: Bilateral;    REVIEW OF SYSTEMS:   Review of Systems  Constitutional: Positive for fatigue. Negative for appetite change, chills, fever and unexpected weight change.  HENT: Negative for mouth sores, nosebleeds, sore throat and trouble swallowing.   Eyes: Negative for eye problems and icterus.  Respiratory: Positive for dyspnea on exertion and cough. Negative for  hemoptysis and wheezing.   Cardiovascular: Negative for chest pain and leg swelling.  Gastrointestinal: Positive for band-like discomfort across upper abdomen, nausea, and constipation.  Genitourinary: Negative for bladder incontinence, difficulty urinating, dysuria, frequency  and hematuria.   Musculoskeletal: Negative for back pain, gait problem, neck pain and neck stiffness.  Skin: Negative for itching and rash.  Neurological: Negative for dizziness, extremity weakness, gait problem, headaches, light-headedness and seizures.  Hematological: Negative for adenopathy. Does not bruise/bleed easily.  Psychiatric/Behavioral: Negative for confusion, depression and sleep disturbance. The patient is not nervous/anxious.     PHYSICAL EXAMINATION:  Blood pressure 117/72, pulse 95, temperature 98 F (36.7 C), temperature source Oral, resp. rate 18, height 5' 6"  (1.676 m), weight 199 lb 1.6 oz (90.3 kg), last menstrual period 03/15/2010, SpO2 97 %.  ECOG PERFORMANCE STATUS: 1 - Symptomatic but completely ambulatory  Physical Exam  Constitutional: Oriented to person, place, and time and well-developed, well-nourished, and in no distress. HENT:  Head: Normocephalic and atraumatic.  Mouth/Throat: Oropharynx is clear and moist. No oropharyngeal exudate.  Eyes: Conjunctivae are normal. Right eye exhibits no discharge. Left eye exhibits no discharge. No scleral icterus.  Neck: Normal range of motion. Neck supple.  Cardiovascular: Normal rate, regular rhythm, normal heart sounds and intact distal pulses.   Pulmonary/Chest: Effort normal and breath sounds normal. No respiratory distress. No wheezes. No rales.  Abdominal: Soft. Bowel sounds are normal. Exhibits no distension and no mass. There is no tenderness.  Musculoskeletal: Normal range of motion. Exhibits no edema.  Lymphadenopathy:    No cervical adenopathy.  Neurological: Alert and oriented to person, place, and time. Exhibits normal muscle tone. Gait normal. Coordination normal.  Skin: Skin is warm and dry. No rash noted. Not diaphoretic. No erythema. No pallor.  Psychiatric: Mood, memory and judgment normal.  Vitals reviewed.  LABORATORY DATA: Lab Results  Component Value Date   WBC 9.8 04/15/2019   HGB 11.0 (L)  04/15/2019   HCT 35.3 (L) 04/15/2019   MCV 89.4 04/15/2019   PLT 416 (H) 04/15/2019      Chemistry      Component Value Date/Time   NA 138 04/15/2019 0911   NA 135 (L) 01/08/2017 1055   K 4.0 04/15/2019 0911   K 3.2 (L) 01/08/2017 1055   CL 100 04/15/2019 0911   CO2 24 04/15/2019 0911   CO2 25 01/08/2017 1055   BUN 10 04/15/2019 0911   BUN 14.6 01/08/2017 1055   CREATININE 1.06 (H) 04/15/2019 0911   CREATININE 1.3 (H) 01/08/2017 1055      Component Value Date/Time   CALCIUM 9.9 04/15/2019 0911   CALCIUM 9.5 01/08/2017 1055   ALKPHOS 108 04/15/2019 0911   ALKPHOS 82 01/08/2017 1055   AST 20 04/15/2019 0911   AST 31  01/08/2017 1055   ALT 11 04/15/2019 0911   ALT 34 01/08/2017 1055   BILITOT 0.3 04/15/2019 0911   BILITOT 1.74 (H) 01/08/2017 1055       RADIOGRAPHIC STUDIES:  CT Chest W Contrast  Result Date: 04/13/2019 CLINICAL DATA:  Stage IV non-small cell lung cancer. EXAM: CT CHEST, ABDOMEN, AND PELVIS WITH CONTRAST TECHNIQUE: Multidetector CT imaging of the chest, abdomen and pelvis was performed following the standard protocol during bolus administration of intravenous contrast. CONTRAST:  176m OMNIPAQUE IOHEXOL 300 MG/ML  SOLN COMPARISON:  12/31/2018 FINDINGS: CT CHEST FINDINGS Cardiovascular: The heart size is normal. No substantial pericardial effusion. Atherosclerotic calcification is noted in the wall of the thoracic aorta. Left-sided Port-A-Cath tip is positioned at the SVC/RA junction. Mediastinum/Nodes: Small prevascular nodes stable. 9 mm short axis AP window lymph node is unchanged in the interval. There is no hilar lymphadenopathy. The esophagus has normal imaging features. There is no axillary lymphadenopathy. Lungs/Pleura: As before, there is diffuse miliary nodularity in the lungs bilaterally. 2.1 x 1.0 cm more confluent nodular lesion in the superior segment right lower lobe has increased from approximately 1.7 x 0.9 cm when I remeasure on the prior study.  1.2 x 0.9 cm nodule in the left lower lobe is similar to 1.1 x 0.8 cm when I remeasure on the prior study. Mild volume loss left hemithorax and small bilateral pleural effusions. The moderate to large left pleural effusion seen previously has decreased in the interval. Musculoskeletal: No worrisome lytic or sclerotic osseous abnormality. CT ABDOMEN PELVIS FINDINGS Hepatobiliary: No suspicious focal abnormality within the liver parenchyma. There is no evidence for gallstones, gallbladder wall thickening, or pericholecystic fluid. No intrahepatic or extrahepatic biliary dilation. Pancreas: No focal mass lesion. No dilatation of the main duct. No intraparenchymal cyst. No peripancreatic edema. Spleen: No splenomegaly. No focal mass lesion. Adrenals/Urinary Tract: No adrenal nodule or mass. Kidneys unremarkable. No evidence for hydroureter. The urinary bladder appears normal for the degree of distention. Stomach/Bowel: Stomach is unremarkable. No gastric wall thickening. No evidence of outlet obstruction. Duodenum is normally positioned as is the ligament of Treitz. No small bowel wall thickening. No small bowel dilatation. The terminal ileum is normal. The appendix is not visualized, but there is no edema or inflammation in the region of the cecum. No gross colonic mass. No colonic wall thickening. Vascular/Lymphatic: There is abdominal aortic atherosclerosis without aneurysm. There is no gastrohepatic or hepatoduodenal ligament lymphadenopathy. No retroperitoneal or mesenteric lymphadenopathy. No pelvic sidewall lymphadenopathy. Reproductive: The uterus is surgically absent. There is no adnexal mass. Other: No intraperitoneal free fluid. Musculoskeletal: No worrisome lytic or sclerotic osseous abnormality. IMPRESSION: 1. Stable to slight progression of diffuse miliary nodularity in the lungs bilaterally. Both lungs demonstrate dominant nodules that are more confluent/slightly increased in the interval. 2. Bilateral  small pleural effusions, with substantial decrease in left pleural fluid since the prior study. 3. Stable borderline mediastinal lymphadenopathy. 4. No evidence for metastatic disease in the abdomen or pelvis. 5. Aortic Atherosclerosis (ICD10-I70.0). Electronically Signed   By: EMisty StanleyM.D.   On: 04/13/2019 09:40   MR Brain W Wo Contrast  Result Date: 04/15/2019 CLINICAL DATA:  Malignant neoplasm of unspecified part of unspecified bronchus or lung. Non-small cell lung cancer, staging. EXAM: MRI HEAD WITHOUT AND WITH CONTRAST TECHNIQUE: Multiplanar, multiecho pulse sequences of the brain and surrounding structures were obtained without and with intravenous contrast. CONTRAST:  127mGADAVIST GADOBUTROL 1 MMOL/ML IV SOLN COMPARISON:  Brain MRI 12/20/2018 FINDINGS: Brain:  There is no evidence of acute infarct. No evidence of intracranial mass. No midline shift or extra-axial fluid collection. No chronic intracranial blood products. Redemonstrated are innumerable small foci of chronic hemosiderin deposition within the supratentorial and infratentorial brain at sites of previous treated metastases. No abnormal intracranial enhancement is demonstrated to suggest active intracranial metastatic disease. Cerebral volume is normal for age. Vascular: Flow voids maintained within the proximal large arterial vessels. Skull and upper cervical spine: No focal marrow lesion. Sinuses/Orbits: Visualized orbits demonstrate no acute abnormality. Small right maxillary sinus mucous retention cyst. No significant mastoid effusion. IMPRESSION: No abnormal intracranial enhancement to suggest active intracranial metastatic disease. Redemonstrated are innumerable small foci of chronic hemosiderin deposition within the supratentorial and infratentorial brain at sites of previously treated metastases. Small right maxillary sinus mucous retention cyst. Electronically Signed   By: Kellie Simmering DO   On: 04/15/2019 10:26   CT Abdomen  Pelvis W Contrast  Result Date: 04/13/2019 CLINICAL DATA:  Stage IV non-small cell lung cancer. EXAM: CT CHEST, ABDOMEN, AND PELVIS WITH CONTRAST TECHNIQUE: Multidetector CT imaging of the chest, abdomen and pelvis was performed following the standard protocol during bolus administration of intravenous contrast. CONTRAST:  138m OMNIPAQUE IOHEXOL 300 MG/ML  SOLN COMPARISON:  12/31/2018 FINDINGS: CT CHEST FINDINGS Cardiovascular: The heart size is normal. No substantial pericardial effusion. Atherosclerotic calcification is noted in the wall of the thoracic aorta. Left-sided Port-A-Cath tip is positioned at the SVC/RA junction. Mediastinum/Nodes: Small prevascular nodes stable. 9 mm short axis AP window lymph node is unchanged in the interval. There is no hilar lymphadenopathy. The esophagus has normal imaging features. There is no axillary lymphadenopathy. Lungs/Pleura: As before, there is diffuse miliary nodularity in the lungs bilaterally. 2.1 x 1.0 cm more confluent nodular lesion in the superior segment right lower lobe has increased from approximately 1.7 x 0.9 cm when I remeasure on the prior study. 1.2 x 0.9 cm nodule in the left lower lobe is similar to 1.1 x 0.8 cm when I remeasure on the prior study. Mild volume loss left hemithorax and small bilateral pleural effusions. The moderate to large left pleural effusion seen previously has decreased in the interval. Musculoskeletal: No worrisome lytic or sclerotic osseous abnormality. CT ABDOMEN PELVIS FINDINGS Hepatobiliary: No suspicious focal abnormality within the liver parenchyma. There is no evidence for gallstones, gallbladder wall thickening, or pericholecystic fluid. No intrahepatic or extrahepatic biliary dilation. Pancreas: No focal mass lesion. No dilatation of the main duct. No intraparenchymal cyst. No peripancreatic edema. Spleen: No splenomegaly. No focal mass lesion. Adrenals/Urinary Tract: No adrenal nodule or mass. Kidneys unremarkable. No  evidence for hydroureter. The urinary bladder appears normal for the degree of distention. Stomach/Bowel: Stomach is unremarkable. No gastric wall thickening. No evidence of outlet obstruction. Duodenum is normally positioned as is the ligament of Treitz. No small bowel wall thickening. No small bowel dilatation. The terminal ileum is normal. The appendix is not visualized, but there is no edema or inflammation in the region of the cecum. No gross colonic mass. No colonic wall thickening. Vascular/Lymphatic: There is abdominal aortic atherosclerosis without aneurysm. There is no gastrohepatic or hepatoduodenal ligament lymphadenopathy. No retroperitoneal or mesenteric lymphadenopathy. No pelvic sidewall lymphadenopathy. Reproductive: The uterus is surgically absent. There is no adnexal mass. Other: No intraperitoneal free fluid. Musculoskeletal: No worrisome lytic or sclerotic osseous abnormality. IMPRESSION: 1. Stable to slight progression of diffuse miliary nodularity in the lungs bilaterally. Both lungs demonstrate dominant nodules that are more confluent/slightly increased in the interval.  2. Bilateral small pleural effusions, with substantial decrease in left pleural fluid since the prior study. 3. Stable borderline mediastinal lymphadenopathy. 4. No evidence for metastatic disease in the abdomen or pelvis. 5. Aortic Atherosclerosis (ICD10-I70.0). Electronically Signed   By: Misty Stanley M.D.   On: 04/13/2019 09:40     ASSESSMENT/PLAN:  This is a very pleasant 54 year old African American female diagnosed with metastatic non-small cell lung cancer, adenocarcinoma. She presented with extensive miliary distribution in the lungs bilaterally diagnosed in September 2018. She is Positive for EGFR mutation in exon 19. She was diagnosed in September 2018.   She is status post 13 months of treatment with Gilotrif 40 mg p.o. daily. This was discontinued due to evidence of disease progression.   She then was  started on Tagrisso 80 mg p.o. daily. She is status post 12 months of treatment.   She was referred to Dr. Durenda Hurt at [facility] and repeat molecular studies was performed which showed BRAF mutation V600E.   Dr. Julien Nordmann recommended that she consider palliative systemic chemotherapy with carboplatin for AUC of 5, Alimta 500 mg/M2 and Avastin 15 mg/KG every 3 weeks. She is currently undergoing this and is status post 3 cycles in addition to 80 mg p.o. daily of Tagrisso.    The patient recently had a restaging CT scan performed. Dr. Julien Nordmann personally and independently reviewed the scan and discussed the results with the patient today. The scan was mostly stable with slight increase in the dominant nodules. Dr. Julien Nordmann recommends that she continue on the same treatment at the same dose.   Her brain MRI did not show any evidence of active intracranial metastatic disease.   She will receive cycle #4 today as scheduled.   We will see her back for a follow up visit in 3 weeks for evaluation before starting cycle #5.   I sent refills of compazine and zofran to her pharmacy. Discussed to alternate her nausea medication for better control of her nausea.   I sent a refill of her oxycodone to take every 8 hours PRN for the pain. She only takes this if absolutely needed.   The patient was advised to call immediately if she has any concerning symptoms in the interval. The patient voices understanding of current disease status and treatment options and is in agreement with the current care plan. All questions were answered. The patient knows to call the clinic with any problems, questions or concerns. We can certainly see the patient much sooner if necessary   No orders of the defined types were placed in this encounter.    Nancy Mattern L Alyss Granato, PA-C 04/15/19  ADDENDUM: Hematology/Oncology Attending: I had a face-to-face encounter with the patient.  I recommended her care plan.  This is  a very pleasant 53 years old African-American female with metastatic non-small cell lung cancer with positive EGFR mutation with deletion in exon 19 status post several years of treatment with afatinib followed by Tagrisso.  The patient had evidence for disease progression and she is currently undergoing systemic chemotherapy with carboplatin, Alimta and Avastin status post 3 cycles.  She has been tolerating the treatment well except for fatigue and occasional nausea. She had repeat CT scan of the chest, abdomen pelvis as well as MRI of the brain performed recently.  I personally and independently reviewed the scans and discussed the results with the patient today. Her scan showed no concerning findings for disease progression except for slight increase of one of the  pulmonary nodules.  MRI of the brain showed no evidence for metastatic disease to the brain. I recommended for the patient to continue her current treatment with systemic chemotherapy in addition to [place]. I will see her back for follow-up visit in 3 weeks for evaluation before the next cycle of her treatment. The patient was advised to call immediately if she has any concerning symptoms in the interval.  Disclaimer: This note was dictated with voice recognition software. Similar sounding words can inadvertently be transcribed and may be missed upon review. Eilleen Kempf, MD 04/18/19

## 2019-04-15 ENCOUNTER — Other Ambulatory Visit: Payer: Self-pay

## 2019-04-15 ENCOUNTER — Ambulatory Visit (HOSPITAL_COMMUNITY): Payer: 59

## 2019-04-15 ENCOUNTER — Inpatient Hospital Stay (HOSPITAL_BASED_OUTPATIENT_CLINIC_OR_DEPARTMENT_OTHER): Payer: Medicare Other | Admitting: Physician Assistant

## 2019-04-15 ENCOUNTER — Ambulatory Visit (HOSPITAL_BASED_OUTPATIENT_CLINIC_OR_DEPARTMENT_OTHER): Payer: 59 | Admitting: Medical

## 2019-04-15 ENCOUNTER — Inpatient Hospital Stay: Payer: Medicare Other

## 2019-04-15 ENCOUNTER — Encounter: Payer: Self-pay | Admitting: Physician Assistant

## 2019-04-15 ENCOUNTER — Ambulatory Visit: Payer: 59 | Admitting: Physician Assistant

## 2019-04-15 ENCOUNTER — Other Ambulatory Visit: Payer: 59

## 2019-04-15 ENCOUNTER — Ambulatory Visit: Payer: 59

## 2019-04-15 VITALS — BP 117/72 | HR 95 | Temp 98.0°F | Resp 18 | Ht 66.0 in | Wt 199.1 lb

## 2019-04-15 DIAGNOSIS — C3492 Malignant neoplasm of unspecified part of left bronchus or lung: Secondary | ICD-10-CM

## 2019-04-15 DIAGNOSIS — R52 Pain, unspecified: Secondary | ICD-10-CM

## 2019-04-15 DIAGNOSIS — C3490 Malignant neoplasm of unspecified part of unspecified bronchus or lung: Secondary | ICD-10-CM

## 2019-04-15 DIAGNOSIS — Z5112 Encounter for antineoplastic immunotherapy: Secondary | ICD-10-CM | POA: Diagnosis present

## 2019-04-15 DIAGNOSIS — K5090 Crohn's disease, unspecified, without complications: Secondary | ICD-10-CM | POA: Diagnosis not present

## 2019-04-15 DIAGNOSIS — Z90710 Acquired absence of both cervix and uterus: Secondary | ICD-10-CM | POA: Diagnosis not present

## 2019-04-15 DIAGNOSIS — Z5111 Encounter for antineoplastic chemotherapy: Secondary | ICD-10-CM

## 2019-04-15 DIAGNOSIS — Z79899 Other long term (current) drug therapy: Secondary | ICD-10-CM | POA: Diagnosis not present

## 2019-04-15 DIAGNOSIS — I10 Essential (primary) hypertension: Secondary | ICD-10-CM | POA: Diagnosis not present

## 2019-04-15 DIAGNOSIS — Z7952 Long term (current) use of systemic steroids: Secondary | ICD-10-CM | POA: Diagnosis not present

## 2019-04-15 DIAGNOSIS — C7931 Secondary malignant neoplasm of brain: Secondary | ICD-10-CM

## 2019-04-15 LAB — CBC WITH DIFFERENTIAL (CANCER CENTER ONLY)
Abs Immature Granulocytes: 0.39 10*3/uL — ABNORMAL HIGH (ref 0.00–0.07)
Basophils Absolute: 0 10*3/uL (ref 0.0–0.1)
Basophils Relative: 0 %
Eosinophils Absolute: 0 10*3/uL (ref 0.0–0.5)
Eosinophils Relative: 0 %
HCT: 35.3 % — ABNORMAL LOW (ref 36.0–46.0)
Hemoglobin: 11 g/dL — ABNORMAL LOW (ref 12.0–15.0)
Immature Granulocytes: 4 %
Lymphocytes Relative: 14 %
Lymphs Abs: 1.4 10*3/uL (ref 0.7–4.0)
MCH: 27.8 pg (ref 26.0–34.0)
MCHC: 31.2 g/dL (ref 30.0–36.0)
MCV: 89.4 fL (ref 80.0–100.0)
Monocytes Absolute: 1.1 10*3/uL — ABNORMAL HIGH (ref 0.1–1.0)
Monocytes Relative: 11 %
Neutro Abs: 6.9 10*3/uL (ref 1.7–7.7)
Neutrophils Relative %: 71 %
Platelet Count: 416 10*3/uL — ABNORMAL HIGH (ref 150–400)
RBC: 3.95 MIL/uL (ref 3.87–5.11)
RDW: 20 % — ABNORMAL HIGH (ref 11.5–15.5)
WBC Count: 9.8 10*3/uL (ref 4.0–10.5)
nRBC: 0 % (ref 0.0–0.2)

## 2019-04-15 LAB — CMP (CANCER CENTER ONLY)
ALT: 11 U/L (ref 0–44)
AST: 20 U/L (ref 15–41)
Albumin: 3.6 g/dL (ref 3.5–5.0)
Alkaline Phosphatase: 108 U/L (ref 38–126)
Anion gap: 14 (ref 5–15)
BUN: 10 mg/dL (ref 6–20)
CO2: 24 mmol/L (ref 22–32)
Calcium: 9.9 mg/dL (ref 8.9–10.3)
Chloride: 100 mmol/L (ref 98–111)
Creatinine: 1.06 mg/dL — ABNORMAL HIGH (ref 0.44–1.00)
GFR, Est AFR Am: >60 mL/min (ref >60)
GFR, Estimated: >60 mL/min (ref >60)
Glucose, Bld: 135 mg/dL — ABNORMAL HIGH (ref 70–99)
Potassium: 4 mmol/L (ref 3.5–5.1)
Sodium: 138 mmol/L (ref 135–145)
Total Bilirubin: 0.3 mg/dL (ref 0.3–1.2)
Total Protein: 8 g/dL (ref 6.5–8.1)

## 2019-04-15 LAB — TOTAL PROTEIN, URINE DIPSTICK: Protein, ur: 30 mg/dL — AB

## 2019-04-15 MED ORDER — ONDANSETRON HCL 8 MG PO TABS: ORAL_TABLET | ORAL | 2 refills | Status: AC

## 2019-04-15 MED ORDER — PROCHLORPERAZINE MALEATE 10 MG PO TABS: 10.0000 mg | ORAL_TABLET | Freq: Four times a day (QID) | ORAL | 2 refills | Status: DC | PRN

## 2019-04-15 MED ORDER — PALONOSETRON HCL INJECTION 0.25 MG/5ML
INTRAVENOUS | Status: AC
  Filled 2019-04-15: qty 5

## 2019-04-15 MED ORDER — SODIUM CHLORIDE 0.9 % IV SOLN
605.5000 mg | Freq: Once | INTRAVENOUS | Status: AC
  Administered 2019-04-15: 610 mg via INTRAVENOUS
  Filled 2019-04-15: qty 61

## 2019-04-15 MED ORDER — OXYCODONE HCL 10 MG PO TABS: 10.0000 mg | ORAL_TABLET | Freq: Three times a day (TID) | ORAL | 0 refills | Status: DC | PRN

## 2019-04-15 MED ORDER — SODIUM CHLORIDE 0.9 % IV SOLN
Freq: Once | INTRAVENOUS | Status: AC
  Filled 2019-04-15: qty 250

## 2019-04-15 MED ORDER — SODIUM CHLORIDE 0.9 % IV SOLN
150.0000 mg | Freq: Once | INTRAVENOUS | Status: AC
  Administered 2019-04-15: 150 mg via INTRAVENOUS
  Filled 2019-04-15: qty 5

## 2019-04-15 MED ORDER — SODIUM CHLORIDE 0.9 % IV SOLN
500.0000 mg/m2 | Freq: Once | INTRAVENOUS | Status: AC
  Administered 2019-04-15: 1100 mg via INTRAVENOUS
  Filled 2019-04-15: qty 40

## 2019-04-15 MED ORDER — SODIUM CHLORIDE 0.9 % IV SOLN
15.2000 mg/kg | Freq: Once | INTRAVENOUS | Status: AC
  Administered 2019-04-15: 1500 mg via INTRAVENOUS
  Filled 2019-04-15: qty 48

## 2019-04-15 MED ORDER — PALONOSETRON HCL INJECTION 0.25 MG/5ML
0.2500 mg | Freq: Once | INTRAVENOUS | Status: AC
  Administered 2019-04-15: 0.25 mg via INTRAVENOUS

## 2019-04-15 MED ORDER — DEXAMETHASONE SODIUM PHOSPHATE 10 MG/ML IJ SOLN
10.0000 mg | Freq: Once | INTRAMUSCULAR | Status: AC
  Administered 2019-04-15: 10 mg via INTRAVENOUS

## 2019-04-15 MED ORDER — DEXAMETHASONE SODIUM PHOSPHATE 10 MG/ML IJ SOLN
INTRAMUSCULAR | Status: AC
  Filled 2019-04-15: qty 1

## 2019-04-15 MED FILL — ONDANSETRON HCL 8 MG TABLET: 8 | 10 days supply | Qty: 30 | Fill #0

## 2019-04-15 MED FILL — oxyCODONE HCL 10 MG TABS: 10 | 10 days supply | Qty: 30 | Fill #0

## 2019-04-15 MED FILL — PROCHLORPERAZINE 10 MG TAB: 10 | 8 days supply | Qty: 30 | Fill #0

## 2019-04-15 NOTE — Patient Instructions (Signed)
COVID-19 Vaccine Information can be found at: [email or web address] For questions related to vaccine distribution or appointments, please email [email or web address] or call [phone].   [facility] Discharge Instructions for Patients Receiving Chemotherapy  Today you received the following chemotherapy agents: Bevacizumab-awwb (MVASI), Pemetrexed (Alimta), and Carboplatin (Paraplatin)  To help prevent nausea and vomiting after your treatment, we encourage you to take your nausea medication as directed by your provider.    If you develop nausea and vomiting that is not controlled by your nausea medication, call the clinic.   BELOW ARE SYMPTOMS THAT SHOULD BE REPORTED IMMEDIATELY:  *FEVER GREATER THAN 100.5 F  *CHILLS WITH OR WITHOUT FEVER  NAUSEA AND VOMITING THAT IS NOT CONTROLLED WITH YOUR NAUSEA MEDICATION  *UNUSUAL SHORTNESS OF BREATH  *UNUSUAL BRUISING OR BLEEDING  TENDERNESS IN MOUTH AND THROAT WITH OR WITHOUT PRESENCE OF ULCERS  *URINARY PROBLEMS  *BOWEL PROBLEMS  UNUSUAL RASH Items with * indicate a potential emergency and should be followed up as soon as possible.  Feel free to call the clinic should you have any questions or concerns. The clinic phone number is [phone].  Please show the [facility] at check-in to the Emergency Department and triage nurse.  Coronavirus (COVID-19) Are you at risk?  Are you at risk for the Coronavirus (COVID-19)?  To be considered HIGH RISK for Coronavirus (COVID-19), you have to meet the following criteria:  . Traveled to [place], [place], [place], [place] or [place]; or in the [place] to [place], [place], [place], or [place]; and have fever, cough, and shortness of breath within the last 2 weeks of travel OR . Been in close contact with a person diagnosed with COVID-19 within the last 2 weeks and have fever, cough, and shortness of  breath . IF YOU DO NOT MEET THESE CRITERIA, YOU ARE CONSIDERED LOW RISK FOR COVID-19.  What to do if you are HIGH RISK for COVID-19?  Marland Kitchen If you are having a medical emergency, call 911. . Seek medical care right away. Before you go to a doctor's office, urgent care or emergency department, call ahead and tell them about your recent travel, contact with someone diagnosed with COVID-19, and your symptoms. You should receive instructions from your physician's office regarding next steps of care.  . When you arrive at healthcare provider, tell the healthcare staff immediately you have returned from visiting [place], [place], [place], [place] or [place]; or traveled in the [place] to [place], [place], [place], or [place]; in the last two weeks or you have been in close contact with a person diagnosed with COVID-19 in the last 2 weeks.   . Tell the health care staff about your symptoms: fever, cough and shortness of breath. . After you have been seen by a medical provider, you will be either: o Tested for (COVID-19) and discharged home on quarantine except to seek medical care if symptoms worsen, and asked to  - Stay home and avoid contact with others until you get your results (4-5 days)  - Avoid travel on public transportation if possible (such as bus, train, or airplane) or o Sent to the Emergency Department by EMS for evaluation, COVID-19 testing, and possible admission depending on your condition and test results.  What to do if you are LOW RISK for COVID-19?  Reduce your risk of any infection by using the same precautions used for avoiding the common cold or flu:  Marland Kitchen Wash your hands often with  soap and warm water for at least 20 seconds.  If soap and water are not readily available, use an alcohol-based hand sanitizer with at least 60% alcohol.  . If coughing or sneezing, cover your mouth and nose by coughing or sneezing into the elbow areas of your shirt or coat, into a tissue or into  your sleeve (not your hands). . Avoid shaking hands with others and consider head nods or verbal greetings only. . Avoid touching your eyes, nose, or mouth with unwashed hands.  . Avoid close contact with people who are sick. . Avoid places or events with large numbers of people in one location, like concerts or sporting events. . Carefully consider travel plans you have or are making. . If you are planning any travel outside or inside the [place], visit the CDC's Travelers' Health webpage for the latest health notices. . If you have some symptoms but not all symptoms, continue to monitor at home and seek medical attention if your symptoms worsen. . If you are having a medical emergency, call 911.   [place] / e-Visit: [email or web address]         [facility]: [facility]: [phone]                   [name]: [phone]

## 2019-04-15 NOTE — Progress Notes (Signed)
The patient was seen in the infusion room today.  Her left chest wall Port-A-Cath incision has still not healed completely there is a large area of eschar in the middle of the incision.  The patient was told to loosely dressed the wound or have it remain open to the air until her next visit.  We are hopeful that the area of eschar may slough and that we may be able to use the patient's port.  Today her port was not used.  She was treated via a peripheral IV today.  The patient may have gotten Avastin on the week before her port was placed.  Sandi Mealy, MHS, PA-C Physician Assistant

## 2019-04-18 ENCOUNTER — Telehealth: Payer: Self-pay | Admitting: Physician Assistant

## 2019-04-18 NOTE — Telephone Encounter (Signed)
Scheduled per los. Called and spoke with patient. Confirmed appt 

## 2019-04-21 ENCOUNTER — Inpatient Hospital Stay: Payer: Medicare Other

## 2019-04-21 ENCOUNTER — Telehealth: Payer: Self-pay | Admitting: *Deleted

## 2019-04-21 NOTE — Telephone Encounter (Signed)
Received vm call from pt stating that she was supposed to come in today but unable to come due to vomiting with phlegm.  She will r/s with Ariel.  She states she took mucinex & is on probiotics with ATB.

## 2019-04-25 ENCOUNTER — Telehealth: Payer: Self-pay

## 2019-04-25 NOTE — Telephone Encounter (Signed)
PA initiated via Covermymeds; KEY: [id number]. Awaiting determination.

## 2019-04-25 NOTE — Telephone Encounter (Signed)
PA approved.   Request Reference Number: [id number]. LINZESS CAP 145MCG is approved through 04/24/2020. Your patient may now fill this prescription and it will be covered.

## 2019-04-27 ENCOUNTER — Telehealth: Payer: Self-pay | Admitting: Family Medicine

## 2019-04-27 ENCOUNTER — Encounter: Payer: Self-pay | Admitting: Family Medicine

## 2019-04-27 NOTE — Telephone Encounter (Signed)
[name]  Call Back # [phone]   Patient states that she might her a diaper rash. patient is looking for something over the counter.   Please advise

## 2019-04-28 ENCOUNTER — Inpatient Hospital Stay: Payer: Medicare Other

## 2019-04-28 ENCOUNTER — Other Ambulatory Visit: Payer: Self-pay

## 2019-04-28 ENCOUNTER — Inpatient Hospital Stay: Payer: Medicare Other | Attending: Oncology

## 2019-04-28 DIAGNOSIS — K5090 Crohn's disease, unspecified, without complications: Secondary | ICD-10-CM | POA: Insufficient documentation

## 2019-04-28 DIAGNOSIS — C3492 Malignant neoplasm of unspecified part of left bronchus or lung: Secondary | ICD-10-CM | POA: Diagnosis present

## 2019-04-28 DIAGNOSIS — R21 Rash and other nonspecific skin eruption: Secondary | ICD-10-CM | POA: Insufficient documentation

## 2019-04-28 DIAGNOSIS — Z5111 Encounter for antineoplastic chemotherapy: Secondary | ICD-10-CM | POA: Insufficient documentation

## 2019-04-28 LAB — CMP (CANCER CENTER ONLY)
ALT: 17 U/L (ref 0–44)
AST: 23 U/L (ref 15–41)
Albumin: 3.7 g/dL (ref 3.5–5.0)
Alkaline Phosphatase: 95 U/L (ref 38–126)
Anion gap: 16 — ABNORMAL HIGH (ref 5–15)
BUN: 9 mg/dL (ref 6–20)
CO2: 25 mmol/L (ref 22–32)
Calcium: 9.7 mg/dL (ref 8.9–10.3)
Chloride: 100 mmol/L (ref 98–111)
Creatinine: 0.99 mg/dL (ref 0.44–1.00)
GFR, Est AFR Am: >60 mL/min (ref >60)
GFR, Estimated: >60 mL/min (ref >60)
Glucose, Bld: 110 mg/dL — ABNORMAL HIGH (ref 70–99)
Potassium: 3.5 mmol/L (ref 3.5–5.1)
Sodium: 141 mmol/L (ref 135–145)
Total Bilirubin: 0.6 mg/dL (ref 0.3–1.2)
Total Protein: 7.3 g/dL (ref 6.5–8.1)

## 2019-04-28 LAB — CBC WITH DIFFERENTIAL (CANCER CENTER ONLY)
Abs Immature Granulocytes: 0 10*3/uL (ref 0.00–0.07)
Basophils Absolute: 0 10*3/uL (ref 0.0–0.1)
Basophils Relative: 0 %
Eosinophils Absolute: 0 10*3/uL (ref 0.0–0.5)
Eosinophils Relative: 1 %
HCT: 27.8 % — ABNORMAL LOW (ref 36.0–46.0)
Hemoglobin: 8.7 g/dL — ABNORMAL LOW (ref 12.0–15.0)
Immature Granulocytes: 0 %
Lymphocytes Relative: 42 %
Lymphs Abs: 1 10*3/uL (ref 0.7–4.0)
MCH: 28.2 pg (ref 26.0–34.0)
MCHC: 31.3 g/dL (ref 30.0–36.0)
MCV: 90.3 fL (ref 80.0–100.0)
Monocytes Absolute: 0.3 10*3/uL (ref 0.1–1.0)
Monocytes Relative: 14 %
Neutro Abs: 1 10*3/uL — ABNORMAL LOW (ref 1.7–7.7)
Neutrophils Relative %: 43 %
Platelet Count: 49 10*3/uL — ABNORMAL LOW (ref 150–400)
RBC: 3.08 MIL/uL — ABNORMAL LOW (ref 3.87–5.11)
RDW: 19 % — ABNORMAL HIGH (ref 11.5–15.5)
WBC Count: 2.3 10*3/uL — ABNORMAL LOW (ref 4.0–10.5)
nRBC: 0 % (ref 0.0–0.2)

## 2019-04-28 NOTE — Progress Notes (Addendum)
Pharmacist Chemotherapy Monitoring - Follow Up Assessment    I verify that I have reviewed each item in the below checklist:  . Regimen for the patient is scheduled for the appropriate day and plan matches scheduled date. Marland Kitchen Appropriate non-routine labs are ordered dependent on drug ordered. . If applicable, additional medications reviewed and ordered per protocol based on lifetime cumulative doses and/or treatment regimen.   Plan for follow-up and/or issues identified: No . I-vent associated with next due treatment: No . MD and/or nursing notified: No  Acquanetta Belling 04/28/2019 2:22 PM

## 2019-04-29 ENCOUNTER — Other Ambulatory Visit: Payer: Self-pay | Admitting: *Deleted

## 2019-04-29 MED ORDER — OSIMERTINIB MESYLATE 80 MG PO TABS: 80.0000 mg | ORAL_TABLET | Freq: Every day | ORAL | 1 refills | Status: DC

## 2019-04-29 MED FILL — LINZESS 145 MCG CAPSULE: 145 | 30 days supply | Qty: 30 | Fill #0

## 2019-05-03 ENCOUNTER — Telehealth: Payer: Self-pay | Admitting: Family Medicine

## 2019-05-03 DIAGNOSIS — K5904 Chronic idiopathic constipation: Secondary | ICD-10-CM

## 2019-05-03 NOTE — Telephone Encounter (Signed)
Medication: linaclotide (LINZESS) 145 MCG CAPS capsule [[id number]]    Has the patient contacted their pharmacy? Yes.   (If no, request that the patient contact the pharmacy for the refill.) (If yes, when and what did the pharmacy advise?)  Preferred Pharmacy (with phone number or street name): [place], [place] - [address]  [place], [place] [address]  Phone:  [phone] Fax:  [phone]  DEA #:  --  Agent: Please be advised that RX refills may take up to 3 business days. We ask that you follow-up with your pharmacy.   Patient wants it to go [facility]

## 2019-05-04 ENCOUNTER — Other Ambulatory Visit: Payer: Self-pay | Admitting: Family Medicine

## 2019-05-04 ENCOUNTER — Other Ambulatory Visit: Payer: Self-pay | Admitting: Internal Medicine

## 2019-05-04 DIAGNOSIS — Z95828 Presence of other vascular implants and grafts: Secondary | ICD-10-CM

## 2019-05-04 MED ORDER — LINACLOTIDE 145 MCG PO CAPS: 145.0000 ug | ORAL_CAPSULE | Freq: Every day | ORAL | 6 refills | Status: DC

## 2019-05-04 MED FILL — DEXAMETHASONE 4 MG TABLET: 4 | 21 days supply | Qty: 6 | Fill #3

## 2019-05-04 MED FILL — FUROSEMIDE 40 MG TAB: 40 | 30 days supply | Qty: 30 | Fill #4

## 2019-05-04 MED FILL — PANTOPRAZOLE SOD DR 40 MG T: 40 | 30 days supply | Qty: 30 | Fill #7

## 2019-05-04 MED FILL — CLINDAMYCIN PH 1% GEL: 1 | 15 days supply | Qty: 30 | Fill #0

## 2019-05-04 MED FILL — FOLIC ACID 1 MG TABS: 1 | 30 days supply | Qty: 30 | Fill #3

## 2019-05-04 MED FILL — POTASSIUM CHLORIDE CRYS ER: 20 | 30 days supply | Qty: 60 | Fill #8

## 2019-05-04 NOTE — Telephone Encounter (Signed)
Medication refilled

## 2019-05-05 ENCOUNTER — Encounter: Payer: Self-pay | Admitting: Internal Medicine

## 2019-05-05 ENCOUNTER — Other Ambulatory Visit: Payer: Self-pay

## 2019-05-05 ENCOUNTER — Inpatient Hospital Stay: Payer: Medicare Other

## 2019-05-05 ENCOUNTER — Inpatient Hospital Stay (HOSPITAL_BASED_OUTPATIENT_CLINIC_OR_DEPARTMENT_OTHER): Payer: Medicare Other | Admitting: Internal Medicine

## 2019-05-05 ENCOUNTER — Inpatient Hospital Stay (HOSPITAL_BASED_OUTPATIENT_CLINIC_OR_DEPARTMENT_OTHER): Payer: Medicare Other | Admitting: Medical

## 2019-05-05 VITALS — BP 108/68 | HR 109 | Temp 98.3°F | Resp 18

## 2019-05-05 VITALS — BP 119/79 | HR 114 | Temp 99.1°F | Resp 18 | Ht 66.0 in | Wt 191.9 lb

## 2019-05-05 DIAGNOSIS — C3492 Malignant neoplasm of unspecified part of left bronchus or lung: Secondary | ICD-10-CM | POA: Diagnosis not present

## 2019-05-05 DIAGNOSIS — I10 Essential (primary) hypertension: Secondary | ICD-10-CM | POA: Diagnosis not present

## 2019-05-05 DIAGNOSIS — Z5111 Encounter for antineoplastic chemotherapy: Secondary | ICD-10-CM

## 2019-05-05 DIAGNOSIS — C7931 Secondary malignant neoplasm of brain: Secondary | ICD-10-CM

## 2019-05-05 DIAGNOSIS — C3490 Malignant neoplasm of unspecified part of unspecified bronchus or lung: Secondary | ICD-10-CM

## 2019-05-05 DIAGNOSIS — Z95828 Presence of other vascular implants and grafts: Secondary | ICD-10-CM

## 2019-05-05 LAB — CBC WITH DIFFERENTIAL (CANCER CENTER ONLY)
Abs Immature Granulocytes: 0.02 10*3/uL (ref 0.00–0.07)
Basophils Absolute: 0 10*3/uL (ref 0.0–0.1)
Basophils Relative: 0 %
Eosinophils Absolute: 0.1 10*3/uL (ref 0.0–0.5)
Eosinophils Relative: 2 %
HCT: 32.2 % — ABNORMAL LOW (ref 36.0–46.0)
Hemoglobin: 9.7 g/dL — ABNORMAL LOW (ref 12.0–15.0)
Immature Granulocytes: 1 %
Lymphocytes Relative: 40 %
Lymphs Abs: 1.5 10*3/uL (ref 0.7–4.0)
MCH: 28.5 pg (ref 26.0–34.0)
MCHC: 30.1 g/dL (ref 30.0–36.0)
MCV: 94.7 fL (ref 80.0–100.0)
Monocytes Absolute: 1.3 10*3/uL — ABNORMAL HIGH (ref 0.1–1.0)
Monocytes Relative: 35 %
Neutro Abs: 0.8 10*3/uL — ABNORMAL LOW (ref 1.7–7.7)
Neutrophils Relative %: 22 %
Platelet Count: 280 10*3/uL (ref 150–400)
RBC: 3.4 MIL/uL — ABNORMAL LOW (ref 3.87–5.11)
RDW: 22.7 % — ABNORMAL HIGH (ref 11.5–15.5)
WBC Count: 3.7 10*3/uL — ABNORMAL LOW (ref 4.0–10.5)
nRBC: 0 % (ref 0.0–0.2)

## 2019-05-05 LAB — CMP (CANCER CENTER ONLY)
ALT: 12 U/L (ref 0–44)
AST: 18 U/L (ref 15–41)
Albumin: 3.7 g/dL (ref 3.5–5.0)
Alkaline Phosphatase: 109 U/L (ref 38–126)
Anion gap: 9 (ref 5–15)
BUN: 11 mg/dL (ref 6–20)
CO2: 29 mmol/L (ref 22–32)
Calcium: 9.8 mg/dL (ref 8.9–10.3)
Chloride: 104 mmol/L (ref 98–111)
Creatinine: 1.08 mg/dL — ABNORMAL HIGH (ref 0.44–1.00)
GFR, Est AFR Am: >60 mL/min (ref >60)
GFR, Estimated: 59 mL/min — ABNORMAL LOW (ref >60)
Glucose, Bld: 112 mg/dL — ABNORMAL HIGH (ref 70–99)
Potassium: 4 mmol/L (ref 3.5–5.1)
Sodium: 142 mmol/L (ref 135–145)
Total Bilirubin: 0.3 mg/dL (ref 0.3–1.2)
Total Protein: 7.4 g/dL (ref 6.5–8.1)

## 2019-05-05 LAB — TOTAL PROTEIN, URINE DIPSTICK: Protein, ur: 100 mg/dL — AB

## 2019-05-05 MED ORDER — SODIUM CHLORIDE 0.9% FLUSH
10.0000 mL | INTRAVENOUS | Status: DC | PRN
  Filled 2019-05-05: qty 10

## 2019-05-05 MED ORDER — PALONOSETRON HCL INJECTION 0.25 MG/5ML
INTRAVENOUS | Status: AC
  Filled 2019-05-05: qty 5

## 2019-05-05 MED ORDER — SODIUM CHLORIDE 0.9 % IV SOLN
15.0000 mg/kg | Freq: Once | INTRAVENOUS | Status: DC
  Filled 2019-05-05 (×2): qty 52

## 2019-05-05 MED ORDER — SODIUM CHLORIDE 0.9 % IV SOLN: 15.0000 mg/kg | Freq: Once | INTRAVENOUS | Status: DC

## 2019-05-05 MED ORDER — SODIUM CHLORIDE 0.9 % IV SOLN
477.6000 mg | Freq: Once | INTRAVENOUS | Status: DC
  Filled 2019-05-05: qty 48

## 2019-05-05 MED ORDER — AMOXICILLIN-POT CLAVULANATE 875-125 MG PO TABS: 1.0000 | ORAL_TABLET | Freq: Two times a day (BID) | ORAL | 0 refills | Status: DC

## 2019-05-05 MED ORDER — PALONOSETRON HCL INJECTION 0.25 MG/5ML
0.2500 mg | Freq: Once | INTRAVENOUS | Status: AC
  Administered 2019-05-05: 0.25 mg via INTRAVENOUS

## 2019-05-05 MED ORDER — SODIUM CHLORIDE 0.9 % IV SOLN
10.0000 mg | Freq: Once | INTRAVENOUS | Status: AC
  Administered 2019-05-05: 10 mg via INTRAVENOUS
  Filled 2019-05-05: qty 10

## 2019-05-05 MED ORDER — SODIUM CHLORIDE 0.9 % IV SOLN
Freq: Once | INTRAVENOUS | Status: AC
  Filled 2019-05-05: qty 250

## 2019-05-05 MED ORDER — SODIUM CHLORIDE 0.9 % IV SOLN: 400.0000 mg/m2 | Freq: Once | INTRAVENOUS | Status: DC

## 2019-05-05 MED ORDER — SODIUM CHLORIDE 0.9 % IV SOLN
150.0000 mg | Freq: Once | INTRAVENOUS | Status: AC
  Administered 2019-05-05: 150 mg via INTRAVENOUS
  Filled 2019-05-05: qty 150

## 2019-05-05 MED ORDER — HEPARIN SOD (PORK) LOCK FLUSH 100 UNIT/ML IV SOLN
500.0000 [IU] | Freq: Once | INTRAVENOUS | Status: DC | PRN
  Filled 2019-05-05: qty 5

## 2019-05-05 MED ORDER — SODIUM CHLORIDE 0.9 % IV SOLN
400.0000 mg/m2 | Freq: Once | INTRAVENOUS | Status: DC
  Filled 2019-05-05 (×2): qty 32

## 2019-05-05 MED FILL — LIDOCAINE-PRILOCAINE CREAM: 2.5-2.5 | 20 days supply | Qty: 30 | Fill #0

## 2019-05-05 MED FILL — NYSTATIN 100,000 UNIT/GM CR: 100000 | 15 days supply | Qty: 30 | Fill #0

## 2019-05-05 MED FILL — AMOX-CLAV 875-125 MG TABLET: 875-125 | 7 days supply | Qty: 14 | Fill #0

## 2019-05-05 NOTE — Progress Notes (Signed)
Pt came in today for port flush and labs assessing the area around the port found that the end of the incision was open and edge of port was visual. Sandi Mealy PA called to see Pt. Per Lucianne Lei we will not be able to use port today. Labs drawn peripherally.

## 2019-05-05 NOTE — Progress Notes (Signed)
Decreased dose to accommodate for weight decrease per MD

## 2019-05-05 NOTE — Progress Notes (Signed)
Order for IV team consult put in. IV RN started a 36 G PIV to right anterior forearm on third attempt.  During pre-med [facility] infusion, PIV got occluded, would not flush or aspirate. Patient c/o pain and discomfort. Infusion stopped. IV taken out intact and warm compress applied to site.   Per Dr. Julien Nordmann: "okay to delay treatment for a week, patient should stay hydrated, also, encourage patient to contact surgeon about her port". Patient informed and she was agreeable with this and verbalized understanding.    Pharmacy notified about delayed treatment.   Scheduling message sent for Lab & Infusion appt next week, preferably on 05/12/2019 at 12 noon.

## 2019-05-05 NOTE — Patient Instructions (Signed)
[facility] Discharge Instructions for Patients Receiving Chemotherapy  Today you received the following chemotherapy agents Bevacizumab (MVASI), Pemetrexed (ALIMTA) & Carboplatin (PARAPLATIN).  To help prevent nausea and vomiting after your treatment, we encourage you to take your nausea medication as prescribed.   If you develop nausea and vomiting that is not controlled by your nausea medication, call the clinic.   BELOW ARE SYMPTOMS THAT SHOULD BE REPORTED IMMEDIATELY:  *FEVER GREATER THAN 100.5 F  *CHILLS WITH OR WITHOUT FEVER  NAUSEA AND VOMITING THAT IS NOT CONTROLLED WITH YOUR NAUSEA MEDICATION  *UNUSUAL SHORTNESS OF BREATH  *UNUSUAL BRUISING OR BLEEDING  TENDERNESS IN MOUTH AND THROAT WITH OR WITHOUT PRESENCE OF ULCERS  *URINARY PROBLEMS  *BOWEL PROBLEMS  UNUSUAL RASH Items with * indicate a potential emergency and should be followed up as soon as possible.  Feel free to call the clinic should you have any questions or concerns. The clinic phone number is [phone].  Please show the [facility] at check-in to the Emergency Department and triage nurse.

## 2019-05-05 NOTE — Progress Notes (Signed)
Per Dr Julien Nordmann , It is okay to treat pt today with hr 111, urine protein 100.

## 2019-05-05 NOTE — Progress Notes (Signed)
Ms. Friddle was seen in the flush room today.  She has recently had a surgical incision over a left chest wall Port-A-Cath that had began to open.  The area was closed by her surgeon.  There was an area of eschar that developed over the site.  The eschar has now sloughed off.  Unfortunately there is an opening immediately deep to the area of prior eschar.  The patient's Port-A-Cath diaphragm can be observed through the opening.  The patient's surgeon was contacted.  They will be in contact with the patient to have her being seen by Dr. Prescott Gum.  His office will be in contact with the patient.  The IV team was contacted to begin an IV on the patient.  Unfortunately it took 3 attempts before IV be successfully placed.  As the IV was being used for premedications it stopped flowing.  IV access could not be reestablished.  Dr. Julien Nordmann agreed to postpone the patient's chemotherapy treatment by 1 week.  She has been instructed to contact Dr. Lucianne Lei Trigt.'s office tomorrow regarding an appointment.  This case was discussed with Dr. Julien Nordmann.  Sandi Mealy, MHS, PA-C Physician Assistant

## 2019-05-05 NOTE — Progress Notes (Signed)
[facility] [phone]) [phone]   [phone]) [phone]  OFFICE PROGRESS NOTE  Copland, Gay Filler, MD [address] [place] [address]  DIAGNOSIS: DIAGNOSIS: stage IV (T3, N2, M1 a) non-small cell lung cancer, poorly differentiated adenocarcinoma with extensive miliary distribution in the lungs bilaterally diagnosed in September 2018. 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 NEGATIVE for the Exon 20 T790M mutation. Repeat molecular studies by guardant 360 at time of progression showed ATM [id number] [facility], V600E  PRIOR THERAPY:  1) Gilotrif 40 mg daily started 11/08/2016.Status post 13 months of treatment. 2) Tagrisso 80 mg p.o. daily.  First dose started January 01, 2018.  Status post 12 months of treatment.   CURRENT THERAPY: 1) systemic chemotherapy with carboplatin for AUC 5, Alimta 500 mg/M2 and Avastin 15 mg/KG every 3 weeks.  First dose February 10, 2018.  Status post 4 cycles.  INTERVAL HISTORY: [name] 54y.o. female returns to the clinic today.  The patient is feeling fine today except for fatigue and she lost around 8 pounds since her last visit.  She denied having any current chest pain except for intermittent pain on the right side of the chest but no significant shortness of breath, cough or hemoptysis.  She denied having any fever or chills.  She has no nausea, vomiting, diarrhea or constipation.  She has no headache or visual changes.  She has a problem with her Port-A-Cath which is being exposed from her skin.  She is supposed to see Dr. VPrescott Gumfor evaluation.  The patient is here today for evaluation before starting cycle #5 of her treatment.  MEDICAL HISTORY: Past Medical History:  Diagnosis Date   Aden[facility] of left lung, stage 4 ([facility] 10/28/2016   Anemia    Arthritis    Constipation    [facility]  d[facility]    in remission 35 years +   Dyspnea    gets "winded"   GERD (gastroesophageal reflux disease)    Goals of care, counseling/discussion 10/28/2016   History of hiatal hernia    noted on CT 12/20   Hypertension    Pneumonia    walking pneumonia in the late 80's   Recurrent pleural effusion on left     ALLERGIES:  is allergic to percocet [oxycodone-acetaminophen]; lisinopril; and losartan.  MEDICATIONS:  Current Outpatient Medications  Medication Sig Dispense Refill   albuterol (VENTOLIN HFA) 108 (90 Base) MCG/ACT inhaler Inhale 2 puffs into the lungs every 6 (six) hours as needed for wheezing or shortness of breath. 8 g 1   amoxicillin-clavulanate (AUGMENTIN) 875-125 MG tablet Take 1 tablet by mouth 2 (two) times daily. 20 tablet 0   clindamycin (CLINDAGEL) 1 % gel Apply 1 application topically 2 (two) times daily. (Patient not taking: Reported on 04/15/2019) 30 g 1   dexamethasone (DECADRON) 4 MG tablet 1 tablet p.o. twice daily the day before, day of and day after chemotherapy every 3 weeks 20 tablet 2   fluticasone (FLONASE) 50 MCG/ACT nasal spray INSTILL 2 SPRAYS INTO BOTH NOSTRILS DAILY (Patient taking differently: Place 2 sprays into both nostrils daily as needed for allergies. ) 16 g 0   folic acid (FOLVITE) 1 MG tablet Take 1 tablet (1 mg total) by mouth daily. 30 tablet 4   furosemide (LASIX) 40 MG tablet TAKE 1 TABLET BY MOUTH ONCE DAILY (Patient  taking differently: Take 40 mg by mouth daily. ) 90 tablet 1   guaiFENesin (MUCINEX) 600 MG 12 hr tablet Take by mouth 2 (two) times daily.     HYDROcodone-homatropine (HYCODAN) 5-1.5 MG/5ML syrup Take 5 mLs by mouth every 6 (six) hours as needed for cough. (Patient not taking: Reported on 04/15/2019) 240 mL 0   lidocaine-prilocaine (EMLA) cream APPLY TO THE AFFECTED AREA(S) AS NEEDED 30 g 0   linaclotide (LINZESS) 145 MCG CAPS capsule Take 1 capsule (145 mcg total) by mouth daily before breakfast. 30 capsule  6   loperamide (IMODIUM) 1 MG/5ML solution Take 10 mLs (2 mg total) by mouth as needed for diarrhea or loose stools. (Patient not taking: Reported on 04/15/2019) 120 mL 0   magnesium oxide (MAG-OX) 400 (241.3 Mg) MG tablet Take 1 tablet (400 mg total) by mouth daily.     methocarbamol (ROBAXIN) 500 MG tablet TAKE 1 TABLET (500 MG TOTAL) BY MOUTH EVERY 8 (EIGHT) HOURS AS NEEDED FOR MUSCLE SPASMS. (Patient not taking: Reported on 04/15/2019) 30 tablet 5   nystatin cream (MYCOSTATIN) APPLY 1 APPLICATION TOPICALLY 2 (TWO) TIMES DAILY. 30 g 0   ondansetron (ZOFRAN) 8 MG tablet TAKE 1 TABLET BY MOUTH EVERY 8 HOURS AS NEEDED FOR NAUSEA OR VOMITING 30 tablet 2   osimertinib mesylate (TAGRISSO) 80 MG tablet Take 1 tablet (80 mg total) by mouth daily. 30 tablet 1   Oxycodone HCl 10 MG TABS Take 1 tablet (10 mg total) by mouth 3 (three) times daily as needed. 30 tablet 0   pantoprazole (PROTONIX) 40 MG tablet TAKE 1 TABLET (40 MG TOTAL) BY MOUTH DAILY AT 12 NOON. (Patient taking differently: Take 40 mg by mouth daily. ) 90 tablet 3   potassium chloride SA (K-DUR) 20 MEQ tablet TAKE 1 TABLET (20 MEQ TOTAL) BY MOUTH 2 TIMES DAILY. (Patient taking differently: Take 20 mEq by mouth 2 (two) times daily. ) 180 tablet 3   prochlorperazine (COMPAZINE) 10 MG tablet Take 1 tablet (10 mg total) by mouth every 6 (six) hours as needed for nausea or vomiting. 30 tablet 2   senna-docusate (SENOKOT-S) 8.6-50 MG tablet Take 1 tablet by mouth at bedtime.     No current facility-administered medications for this visit.   Facility-Administered Medications Ordered in Other Visits  Medication Dose Route Frequency Provider Last Rate Last Admin   heparin lock flush 100 unit/mL  500 Units Intracatheter Once PRN Curt Bears, MD       sodium chloride flush (NS) 0.9 % injection 10 mL  10 mL Intracatheter PRN Curt Bears, MD        SURGICAL HISTORY:  Past Surgical History:  Procedure Laterality Date    ABDOMINAL HYSTERECTOMY     partial hysterectomy   BREAST EXCISIONAL BIOPSY Left 20+ yrs ago   benign   CHEST TUBE INSERTION Left 10/26/2018   Procedure: INSERTION PLEURAL DRAINAGE CATHETER;  Surgeon: Ivin Poot, MD;  Location: [facility];  Service: Thoracic;  Laterality: Left;   COLONOSCOPY     PERICARDIAL WINDOW N/A 01/10/2017   Procedure: PERICARDIAL WINDOW- SUB XYPHOID, RIGHT CHEST TUBE;  Surgeon: Ivin Poot, MD;  Location: [facility];  Service: Thoracic;  Laterality: N/A;   PORTACATH PLACEMENT Left 02/21/2019   Procedure: INSERTION PORT-A-CATH;  Surgeon: Ivin Poot, MD;  Location: [facility];  Service: Thoracic;  Laterality: Left;   REMOVAL OF PLEURAL DRAINAGE CATHETER Left 02/21/2019   Procedure: REMOVAL OF PLEURAL DRAINAGE CATHETER;  Surgeon: Ivin Poot,  MD;  Location: [facility];  Service: Thoracic;  Laterality: Left;   VIDEO BRONCHOSCOPY Bilateral 10/21/2016   Procedure: VIDEO BRONCHOSCOPY WITH FLUORO;  Surgeon: Tanda Rockers, MD;  Location: [facility];  Service: Cardiopulmonary;  Laterality: Bilateral;    REVIEW OF SYSTEMS:  A comprehensive review of systems was negative except for: Constitutional: positive for fatigue Respiratory: positive for dyspnea on exertion   PHYSICAL EXAMINATION: General appearance: alert, cooperative, fatigued and no distress Head: Normocephalic, without obvious abnormality, atraumatic Neck: no adenopathy, no JVD, supple, symmetrical, trachea midline and thyroid not enlarged, symmetric, no tenderness/mass/nodules Lymph nodes: Cervical, supraclavicular, and axillary nodes normal. Resp: clear to auscultation bilaterally Back: symmetric, no curvature. ROM normal. No CVA tenderness. Cardio: regular rate and rhythm, S1, S2 normal, no murmur, click, rub or gallop GI: soft, non-tender; bowel sounds normal; no masses,  no organomegaly Extremities: extremities normal, atraumatic, no cyanosis or edema  ECOG PERFORMANCE STATUS: 1 - Symptomatic but  completely ambulatory  Blood pressure 119/79, pulse (!) 114, temperature 99.1 F (37.3 C), temperature source Temporal, resp. rate 18, height 5' 6"  (1.676 m), weight 191 lb 14.4 oz (87 kg), last menstrual period 03/15/2010, SpO2 100 %.  LABORATORY DATA: Lab Results  Component Value Date   WBC 3.7 (L) 05/05/2019   HGB 9.7 (L) 05/05/2019   HCT 32.2 (L) 05/05/2019   MCV 94.7 05/05/2019   PLT 280 05/05/2019      Chemistry      Component Value Date/Time   NA 142 05/05/2019 1231   NA 135 (L) 01/08/2017 1055   K 4.0 05/05/2019 1231   K 3.2 (L) 01/08/2017 1055   CL 104 05/05/2019 1231   CO2 29 05/05/2019 1231   CO2 25 01/08/2017 1055   BUN 11 05/05/2019 1231   BUN 14.6 01/08/2017 1055   CREATININE 1.08 (H) 05/05/2019 1231   CREATININE 1.3 (H) 01/08/2017 1055      Component Value Date/Time   CALCIUM 9.8 05/05/2019 1231   CALCIUM 9.5 01/08/2017 1055   ALKPHOS 109 05/05/2019 1231   ALKPHOS 82 01/08/2017 1055   AST 18 05/05/2019 1231   AST 31 01/08/2017 1055   ALT 12 05/05/2019 1231   ALT 34 01/08/2017 1055   BILITOT 0.3 05/05/2019 1231   BILITOT 1.74 (H) 01/08/2017 1055       RADIOGRAPHIC STUDIES: CT Chest W Contrast  Result Date: 04/13/2019 CLINICAL DATA:  Stage IV non-small cell lung cancer. EXAM: CT CHEST, ABDOMEN, AND PELVIS WITH CONTRAST TECHNIQUE: Multidetector CT imaging of the chest, abdomen and pelvis was performed following the standard protocol during bolus administration of intravenous contrast. CONTRAST:  151m OMNIPAQUE IOHEXOL 300 MG/ML  SOLN COMPARISON:  12/31/2018 FINDINGS: CT CHEST FINDINGS Cardiovascular: The heart size is normal. No substantial pericardial effusion. Atherosclerotic calcification is noted in the wall of the thoracic aorta. Left-sided Port-A-Cath tip is positioned at the SVC/RA junction. Mediastinum/Nodes: Small prevascular nodes stable. 9 mm short axis AP window lymph node is unchanged in the interval. There is no hilar lymphadenopathy. The  esophagus has normal imaging features. There is no axillary lymphadenopathy. Lungs/Pleura: As before, there is diffuse miliary nodularity in the lungs bilaterally. 2.1 x 1.0 cm more confluent nodular lesion in the superior segment right lower lobe has increased from approximately 1.7 x 0.9 cm when I remeasure on the prior study. 1.2 x 0.9 cm nodule in the left lower lobe is similar to 1.1 x 0.8 cm when I remeasure on the prior study. Mild volume loss left hemithorax  and small bilateral pleural effusions. The moderate to large left pleural effusion seen previously has decreased in the interval. Musculoskeletal: No worrisome lytic or sclerotic osseous abnormality. CT ABDOMEN PELVIS FINDINGS Hepatobiliary: No suspicious focal abnormality within the liver parenchyma. There is no evidence for gallstones, gallbladder wall thickening, or pericholecystic fluid. No intrahepatic or extrahepatic biliary dilation. Pancreas: No focal mass lesion. No dilatation of the main duct. No intraparenchymal cyst. No peripancreatic edema. Spleen: No splenomegaly. No focal mass lesion. Adrenals/Urinary Tract: No adrenal nodule or mass. Kidneys unremarkable. No evidence for hydroureter. The urinary bladder appears normal for the degree of distention. Stomach/Bowel: Stomach is unremarkable. No gastric wall thickening. No evidence of outlet obstruction. Duodenum is normally positioned as is the ligament of Treitz. No small bowel wall thickening. No small bowel dilatation. The terminal ileum is normal. The appendix is not visualized, but there is no edema or inflammation in the region of the cecum. No gross colonic mass. No colonic wall thickening. Vascular/Lymphatic: There is abdominal aortic atherosclerosis without aneurysm. There is no gastrohepatic or hepatoduodenal ligament lymphadenopathy. No retroperitoneal or mesenteric lymphadenopathy. No pelvic sidewall lymphadenopathy. Reproductive: The uterus is surgically absent. There is no  adnexal mass. Other: No intraperitoneal free fluid. Musculoskeletal: No worrisome lytic or sclerotic osseous abnormality. IMPRESSION: 1. Stable to slight progression of diffuse miliary nodularity in the lungs bilaterally. Both lungs demonstrate dominant nodules that are more confluent/slightly increased in the interval. 2. Bilateral small pleural effusions, with substantial decrease in left pleural fluid since the prior study. 3. Stable borderline mediastinal lymphadenopathy. 4. No evidence for metastatic disease in the abdomen or pelvis. 5. Aortic Atherosclerosis (ICD10-I70.0). Electronically Signed   By: Misty Stanley M.D.   On: 04/13/2019 09:40   MR Brain W Wo Contrast  Result Date: 04/15/2019 CLINICAL DATA:  Malignant neoplasm of unspecified part of unspecified bronchus or lung. Non-small cell lung cancer, staging. EXAM: MRI HEAD WITHOUT AND WITH CONTRAST TECHNIQUE: Multiplanar, multiecho pulse sequences of the brain and surrounding structures were obtained without and with intravenous contrast. CONTRAST:  62m GADAVIST GADOBUTROL 1 MMOL/ML IV SOLN COMPARISON:  Brain MRI 12/20/2018 FINDINGS: Brain: There is no evidence of acute infarct. No evidence of intracranial mass. No midline shift or extra-axial fluid collection. No chronic intracranial blood products. Redemonstrated are innumerable small foci of chronic hemosiderin deposition within the supratentorial and infratentorial brain at sites of previous treated metastases. No abnormal intracranial enhancement is demonstrated to suggest active intracranial metastatic disease. Cerebral volume is normal for age. Vascular: Flow voids maintained within the proximal large arterial vessels. Skull and upper cervical spine: No focal marrow lesion. Sinuses/Orbits: Visualized orbits demonstrate no acute abnormality. Small right maxillary sinus mucous retention cyst. No significant mastoid effusion. IMPRESSION: No abnormal intracranial enhancement to suggest active  intracranial metastatic disease. Redemonstrated are innumerable small foci of chronic hemosiderin deposition within the supratentorial and infratentorial brain at sites of previously treated metastases. Small right maxillary sinus mucous retention cyst. Electronically Signed   By: KKellie SimmeringDO   On: 04/15/2019 10:26   CT Abdomen Pelvis W Contrast  Result Date: 04/13/2019 CLINICAL DATA:  Stage IV non-small cell lung cancer. EXAM: CT CHEST, ABDOMEN, AND PELVIS WITH CONTRAST TECHNIQUE: Multidetector CT imaging of the chest, abdomen and pelvis was performed following the standard protocol during bolus administration of intravenous contrast. CONTRAST:  1038mOMNIPAQUE IOHEXOL 300 MG/ML  SOLN COMPARISON:  12/31/2018 FINDINGS: CT CHEST FINDINGS Cardiovascular: The heart size is normal. No substantial pericardial effusion. Atherosclerotic calcification is noted in  the wall of the thoracic aorta. Left-sided Port-A-Cath tip is positioned at the SVC/RA junction. Mediastinum/Nodes: Small prevascular nodes stable. 9 mm short axis AP window lymph node is unchanged in the interval. There is no hilar lymphadenopathy. The esophagus has normal imaging features. There is no axillary lymphadenopathy. Lungs/Pleura: As before, there is diffuse miliary nodularity in the lungs bilaterally. 2.1 x 1.0 cm more confluent nodular lesion in the superior segment right lower lobe has increased from approximately 1.7 x 0.9 cm when I remeasure on the prior study. 1.2 x 0.9 cm nodule in the left lower lobe is similar to 1.1 x 0.8 cm when I remeasure on the prior study. Mild volume loss left hemithorax and small bilateral pleural effusions. The moderate to large left pleural effusion seen previously has decreased in the interval. Musculoskeletal: No worrisome lytic or sclerotic osseous abnormality. CT ABDOMEN PELVIS FINDINGS Hepatobiliary: No suspicious focal abnormality within the liver parenchyma. There is no evidence for gallstones,  gallbladder wall thickening, or pericholecystic fluid. No intrahepatic or extrahepatic biliary dilation. Pancreas: No focal mass lesion. No dilatation of the main duct. No intraparenchymal cyst. No peripancreatic edema. Spleen: No splenomegaly. No focal mass lesion. Adrenals/Urinary Tract: No adrenal nodule or mass. Kidneys unremarkable. No evidence for hydroureter. The urinary bladder appears normal for the degree of distention. Stomach/Bowel: Stomach is unremarkable. No gastric wall thickening. No evidence of outlet obstruction. Duodenum is normally positioned as is the ligament of Treitz. No small bowel wall thickening. No small bowel dilatation. The terminal ileum is normal. The appendix is not visualized, but there is no edema or inflammation in the region of the cecum. No gross colonic mass. No colonic wall thickening. Vascular/Lymphatic: There is abdominal aortic atherosclerosis without aneurysm. There is no gastrohepatic or hepatoduodenal ligament lymphadenopathy. No retroperitoneal or mesenteric lymphadenopathy. No pelvic sidewall lymphadenopathy. Reproductive: The uterus is surgically absent. There is no adnexal mass. Other: No intraperitoneal free fluid. Musculoskeletal: No worrisome lytic or sclerotic osseous abnormality. IMPRESSION: 1. Stable to slight progression of diffuse miliary nodularity in the lungs bilaterally. Both lungs demonstrate dominant nodules that are more confluent/slightly increased in the interval. 2. Bilateral small pleural effusions, with substantial decrease in left pleural fluid since the prior study. 3. Stable borderline mediastinal lymphadenopathy. 4. No evidence for metastatic disease in the abdomen or pelvis. 5. Aortic Atherosclerosis (ICD10-I70.0). Electronically Signed   By: Misty Stanley M.D.   On: 04/13/2019 09:40    ASSESSMENT AND PLAN: This is a very pleasant 54 years old African-American female with metastatic non-small cell lung cancer, adenocarcinoma and positive  for EGFR mutation with deletion in exon 19.  The patient is currently on treatment with GILOTRIF 40 mg p.o. daily status post 13 months.  The patient is tolerating this treatment well except for few episodes of diarrhea and mild skin rash. She is complaining of dizzy spells and vertigo recently. Repeat CT scan of the chest, abdomen and pelvis performed recently showed some evidence for disease progression with increase in size and number of pulmonary nodules. Her treatment was switched to Tagrisso 80 mg p.o. daily started January 01, 2018.  She is status post 12 months of treatment The patient has been tolerating this treatment well with no concerning adverse effects. She had repeat MRI of the brain as well as CT scan of the chest, abdomen pelvis performed recently. Her MRI of the brain showed no concerning findings for disease progression in the brain but CT scan of the chest showed some evidence for progression  with lymphangitic spread of the disease. She was referred to Dr[facility] Du[facility]da Hurt at [facility] and repeat molecular studies showed the emergence of BRAF mutation V600E. I recommended for the patient to consider systemic chemotherapy and she is here today for evaluation and discussion of this option.  I had a lengthy discussion with the patient today regarding her condition.  She understands that a combination of BRAF inhibitor and Tagrisso has not been well studied at this point. She is currently undergoing systemic chemotherapy with carboplatin for AUC of 5, Alimta 500 mg/M2 and Avastin 15 mg/KG every 3 weeks, status post 4 cycles.  She also continues her current treatment with Tagrisso 80 mg p.o. daily. She is tolerating her treatment well except for fatigue. Her absolute neutrophil count is low today.  I will reduce her dose of carboplatin to AUC of 4 and Alimta to 400 mg/M2 for the cycle. We will continue to monitor her blood work closely and consider the patient for growth factors if  needed. For the exposed Port-A-Cath, the patient will be referred back to Dr. Prescott Gum for evaluation and management.  She will be treated with a peripheral IV this time. For the diarrhea she will continue her current treatment with Imodium for now. For the nausea, she will continue on Zofran 8 mg p.o. every 8 hours as needed.  She will also alternate with Compazine if needed. For the pain management, she will continue her current treatment with oxycodone as needed. The patient was advised to call immediately if she has any concerning symptoms in the interval. The patient voices understanding of current disease status and treatment options and is in agreement with the current care plan. All questions were answered. The patient knows to call the clinic with any problems, questions or concerns. We can certainly see the patient much sooner if necessary.  Disclaimer: This note was dictated with voice recognition software. Similar sounding words can inadvertently be transcribed and may not be corrected upon review.

## 2019-05-05 NOTE — Progress Notes (Signed)
Dr. Julien Nordmann has reviewed labs/vitals for today, including HR, ANC, and urine protein. Chemotherapy doses have been decreased.   Nancy Moore, PharmD, BCPS, [facility] Pharmacist Pharmacy Phone: [phone] 05/05/2019

## 2019-05-06 ENCOUNTER — Other Ambulatory Visit: Payer: Self-pay | Admitting: *Deleted

## 2019-05-06 ENCOUNTER — Encounter: Payer: Self-pay | Admitting: Cardiothoracic Surgery

## 2019-05-06 ENCOUNTER — Telehealth: Payer: Self-pay | Admitting: Internal Medicine

## 2019-05-06 ENCOUNTER — Ambulatory Visit (INDEPENDENT_AMBULATORY_CARE_PROVIDER_SITE_OTHER): Payer: Medicare Other | Admitting: Cardiothoracic Surgery

## 2019-05-06 DIAGNOSIS — Z85118 Personal history of other malignant neoplasm of bronchus and lung: Secondary | ICD-10-CM

## 2019-05-06 DIAGNOSIS — T80219A Unspecified infection due to central venous catheter, initial encounter: Secondary | ICD-10-CM | POA: Insufficient documentation

## 2019-05-06 DIAGNOSIS — T80219D Unspecified infection due to central venous catheter, subsequent encounter: Secondary | ICD-10-CM | POA: Diagnosis not present

## 2019-05-06 NOTE — Progress Notes (Signed)
PCP is Copland, Gay Filler, MD Referring Provider is Curt Bears, MD  Chief Complaint  Patient presents with  . Lung Cancer    check Port-A-Cath    HPI: Patient presents for evaluation of her left Port-A-Cath placed for stage IV lung cancer.  Catheter reservoir has been noted to erode through the skin and is exposed.  Catheter will need to be removed in the operating room.  She is currently on oral Augmentin without fever or signs of infection.  She has not used a Port-A-Cath in several days.  We will plan on doing outpatient removal of left Port-A-Cath early next week-April 13.  I have discussed the procedure benefits and risks with her.  She understands if we find the capsule to be infected we may need to leave the incision open.  Patient is on chronic oral chemotherapy   Past Medical History:  Diagnosis Date  . Adenocarcinoma of left lung, stage 4 ([name]) 10/28/2016  . Anemia   . Arthritis   . Constipation   . Crohn's disease ([place])    in remission 35 years +  . Dyspnea    gets "winded"  . GERD (gastroesophageal reflux disease)   . Goals of care, counseling/discussion 10/28/2016  . History of hiatal hernia    noted on CT 12/20  . Hypertension   . Pneumonia    walking pneumonia in the late 80's  . Recurrent pleural effusion on left     Past Surgical History:  Procedure Laterality Date  . ABDOMINAL HYSTERECTOMY     partial hysterectomy  . BREAST EXCISIONAL BIOPSY Left 20+ yrs ago   benign  . CHEST TUBE INSERTION Left 10/26/2018   Procedure: INSERTION PLEURAL DRAINAGE CATHETER;  Surgeon: Ivin Poot, MD;  Location: [place];  Service: Thoracic;  Laterality: Left;  . COLONOSCOPY    . PERICARDIAL WINDOW N/A 01/10/2017   Procedure: PERICARDIAL WINDOW- SUB XYPHOID, RIGHT CHEST TUBE;  Surgeon: Ivin Poot, MD;  Location: [place];  Service: Thoracic;  Laterality: N/A;  . PORTACATH PLACEMENT Left 02/21/2019   Procedure: INSERTION PORT-A-CATH;  Surgeon: Ivin Poot, MD;   Location: [place];  Service: Thoracic;  Laterality: Left;  . REMOVAL OF PLEURAL DRAINAGE CATHETER Left 02/21/2019   Procedure: REMOVAL OF PLEURAL DRAINAGE CATHETER;  Surgeon: Ivin Poot, MD;  Location: [place];  Service: Thoracic;  Laterality: Left;  Marland Kitchen VIDEO BRONCHOSCOPY Bilateral 10/21/2016   Procedure: VIDEO BRONCHOSCOPY WITH FLUORO;  Surgeon: Tanda Rockers, MD;  [facility];  Service: Cardiopulmonary;  Laterality: Bilateral;    Family History  Problem Relation Age of Onset  . Asthma Mother   . Stroke Mother   . Hypertension Mother   . Heart attack Father   . Hypertension Father   . Hyperlipidemia Father   . Dementia Father   . Emphysema Maternal Grandmother   . Hypertension Maternal Grandmother   . Colon cancer Paternal Grandmother   . Brain cancer Maternal Uncle     Social History Social History   Tobacco Use  . Smoking status: Never Smoker  . Smokeless tobacco: Never Used  Substance Use Topics  . Alcohol use: Not Currently    Comment: socially  . Drug use: No    Current Outpatient Medications  Medication Sig Dispense Refill  . albuterol (VENTOLIN HFA) 108 (90 Base) MCG/ACT inhaler Inhale 2 puffs into the lungs every 6 (six) hours as needed for wheezing or shortness of breath. 8 g 1  . amoxicillin-clavulanate (AUGMENTIN) 875-125  MG tablet Take 1 tablet by mouth 2 (two) times daily. 14 tablet 0  . clindamycin (CLINDAGEL) 1 % gel Apply 1 application topically 2 (two) times daily. 30 g 1  . dexamethasone (DECADRON) 4 MG tablet 1 tablet p.o. twice daily the day before, day of and day after chemotherapy every 3 weeks (Patient taking differently: Take 4 mg by mouth See admin instructions. 4 mg  twice daily the day before, day of and day after chemotherapy every 3 weeks) 20 tablet 2  . fluticasone (FLONASE) 50 MCG/ACT nasal spray INSTILL 2 SPRAYS INTO BOTH NOSTRILS DAILY (Patient taking differently: Place 2 sprays into both nostrils daily as needed for allergies. )  16 g 0  . folic acid (FOLVITE) 1 MG tablet Take 1 tablet (1 mg total) by mouth daily. 30 tablet 4  . furosemide (LASIX) 40 MG tablet TAKE 1 TABLET BY MOUTH ONCE DAILY (Patient taking differently: Take 40 mg by mouth daily. ) 90 tablet 1  . guaiFENesin (MUCINEX) 600 MG 12 hr tablet Take 600 mg by mouth 2 (two) times daily as needed for to loosen phlegm.     Marland Kitchen HYDROcodone-homatropine (HYCODAN) 5-1.5 MG/5ML syrup Take 5 mLs by mouth every 6 (six) hours as needed for cough. 240 mL 0  . lidocaine-prilocaine (EMLA) cream APPLY TO THE AFFECTED AREA(S) AS NEEDED (Patient taking differently: Apply 1 application topically as needed [facility]). ) 30 g 0  . linaclotide (LINZESS) 145 MCG CAPS capsule Take 1 capsule (145 mcg total) by mouth daily before breakfast. 30 capsule 6  . loperamide (IMODIUM) 1 MG/5ML solution Take 10 mLs (2 mg total) by mouth as needed for diarrhea or loose stools. (Patient taking differently: Take 2 mg by mouth daily as needed for diarrhea or loose stools. ) 120 mL 0  . magnesium oxide (MAG-OX) 400 (241.3 Mg) MG tablet Take 1 tablet (400 mg total) by mouth daily.    . methocarbamol (ROBAXIN) 500 MG tablet TAKE 1 TABLET (500 MG TOTAL) BY MOUTH EVERY 8 (EIGHT) HOURS AS NEEDED FOR MUSCLE SPASMS. 30 tablet 5  . nystatin cream (MYCOSTATIN) APPLY 1 APPLICATION TOPICALLY 2 (TWO) TIMES DAILY. (Patient taking differently: Apply 1 application topically daily as needed (skin irritaion). ) 30 g 0  . ondansetron (ZOFRAN) 8 MG tablet TAKE 1 TABLET BY MOUTH EVERY 8 HOURS AS NEEDED FOR NAUSEA OR VOMITING (Patient taking differently: Take 8 mg by mouth every 8 (eight) hours as needed for nausea or vomiting. ) 30 tablet 2  . osimertinib mesylate (TAGRISSO) 80 MG tablet Take 1 tablet (80 mg total) by mouth daily. 30 tablet 1  . Oxycodone HCl 10 MG TABS Take 1 tablet (10 mg total) by mouth 3 (three) times daily as needed. (Patient taking differently: Take 10 mg by mouth 2 (two) times daily as needed (Pain). ) 30  tablet 0  . pantoprazole (PROTONIX) 40 MG tablet TAKE 1 TABLET (40 MG TOTAL) BY MOUTH DAILY AT 12 NOON. (Patient taking differently: Take 40 mg by mouth daily. ) 90 tablet 3  . potassium chloride SA (K-DUR) 20 MEQ tablet TAKE 1 TABLET (20 MEQ TOTAL) BY MOUTH 2 TIMES DAILY. (Patient taking differently: Take 20 mEq by mouth 2 (two) times daily. ) 180 tablet 3  . prochlorperazine (COMPAZINE) 10 MG tablet Take 1 tablet (10 mg total) by mouth every 6 (six) hours as needed for nausea or vomiting. 30 tablet 2  . senna-docusate (SENOKOT-S) 8.6-50 MG tablet Take 1 tablet by mouth at bedtime.  No current facility-administered medications for this visit.    Allergies  Allergen Reactions  . Percocet [Oxycodone-Acetaminophen] Other (See Comments)    "makes me dizzy" / takes TID at home  . Lisinopril Cough  . Losartan Cough    Review of Systems  No fever chills, nausea or vomiting No shortness of breath Patient previously had a left Pleurx catheter for malignant effusion but that has been removed.  Last chest x-ray shows no significant left pleural effusion.   BP 119/75   Pulse 95   Temp 97.7 F (36.5 C) (Skin)   Resp 20   Ht 5' 6"  (1.676 m)   Wt 192 lb (87.1 kg)   LMP 03/15/2010   SpO2 94% Comment: RA  BMI 30.99 kg/m  Physical Exam      Exam    General- alert and comfortable    Neck- no JVD, no cervical adenopathy palpable, no carotid bruit   Lungs- clear without rales, wheezes   Cor- regular rate and rhythm, no murmur , gallop   Abdomen- soft, non-tender   Extremities - warm, non-tender, minimal edema   Neuro- oriented, appropriate, no focal weakness   Diagnostic Tests: None  Impression: Exposed left Port-A-Cath will need to be removed Scheduled for surgery outpatient at [facility] April 13   plan: Return for outpatient surgery April 13 to remove Port-A-Cath  Len Childs, MD [facility] [phone]

## 2019-05-06 NOTE — Telephone Encounter (Signed)
Scheduled appt per 4/8 sch message - pt is aware of appt date and time

## 2019-05-06 NOTE — Progress Notes (Signed)
Pharmacist Chemotherapy Monitoring - Follow Up Assessment    I verify that I have reviewed each item in the below checklist:  . Regimen for the patient is scheduled for the appropriate day and plan matches scheduled date. Marland Kitchen Appropriate non-routine labs are ordered dependent on drug ordered. . If applicable, additional medications reviewed and ordered per protocol based on lifetime cumulative doses and/or treatment regimen.   Plan for follow-up and/or issues identified: No . I-vent associated with next due treatment: No .   Gertrude Tarbet D 05/06/2019 3:29 PM

## 2019-05-07 ENCOUNTER — Other Ambulatory Visit (HOSPITAL_COMMUNITY)
Admission: RE | Admit: 2019-05-07 | Discharge: 2019-05-07 | Disposition: A | Discharge status: Home / Self Care | Payer: Medicare Other | Source: Ambulatory Visit | Attending: Cardiothoracic Surgery | Admitting: Cardiothoracic Surgery

## 2019-05-07 DIAGNOSIS — Z01812 Encounter for preprocedural laboratory examination: Secondary | ICD-10-CM | POA: Diagnosis present

## 2019-05-07 DIAGNOSIS — Z20822 Contact with and (suspected) exposure to covid-19: Secondary | ICD-10-CM | POA: Insufficient documentation

## 2019-05-07 LAB — SARS CORONAVIRUS 2 (TAT 6-24 HRS): SARS Coronavirus 2: NEGATIVE

## 2019-05-09 ENCOUNTER — Other Ambulatory Visit: Payer: Self-pay

## 2019-05-09 NOTE — Progress Notes (Signed)
Spoke with pt for pre-op call. Pt denies cardiac history or Diabetes.   Covid test done 05/07/19 and it's negative. Pt states she has been in quarantine and understands that she needs to stay in quarantine until she comes to the hospital tomorrow.

## 2019-05-10 ENCOUNTER — Other Ambulatory Visit: Payer: Self-pay | Admitting: Cardiothoracic Surgery

## 2019-05-10 ENCOUNTER — Ambulatory Visit (HOSPITAL_COMMUNITY): Payer: Medicare Other | Admitting: Anesthesiology

## 2019-05-10 ENCOUNTER — Ambulatory Visit (HOSPITAL_COMMUNITY): Payer: Medicare Other

## 2019-05-10 ENCOUNTER — Encounter (HOSPITAL_COMMUNITY)
Admission: RE | Disposition: A | Discharge status: Home / Self Care | Payer: Self-pay | Source: Home / Self Care | Attending: Cardiothoracic Surgery

## 2019-05-10 ENCOUNTER — Ambulatory Visit (HOSPITAL_COMMUNITY)
Admission: RE | Admit: 2019-05-10 | Discharge: 2019-05-10 | Disposition: A | Discharge status: Home / Self Care | Payer: Medicare Other | Attending: Cardiothoracic Surgery | Admitting: Cardiothoracic Surgery

## 2019-05-10 ENCOUNTER — Other Ambulatory Visit: Payer: Self-pay

## 2019-05-10 ENCOUNTER — Encounter (HOSPITAL_COMMUNITY): Payer: Self-pay | Admitting: Cardiothoracic Surgery

## 2019-05-10 DIAGNOSIS — Z823 Family history of stroke: Secondary | ICD-10-CM | POA: Diagnosis not present

## 2019-05-10 DIAGNOSIS — Z825 Family history of asthma and other chronic lower respiratory diseases: Secondary | ICD-10-CM | POA: Insufficient documentation

## 2019-05-10 DIAGNOSIS — Z85118 Personal history of other malignant neoplasm of bronchus and lung: Secondary | ICD-10-CM

## 2019-05-10 DIAGNOSIS — Z8249 Family history of ischemic heart disease and other diseases of the circulatory system: Secondary | ICD-10-CM | POA: Insufficient documentation

## 2019-05-10 DIAGNOSIS — T82898A Other specified complication of vascular prosthetic devices, implants and grafts, initial encounter: Secondary | ICD-10-CM | POA: Diagnosis not present

## 2019-05-10 DIAGNOSIS — Z800 Family history of malignant neoplasm of digestive organs: Secondary | ICD-10-CM | POA: Insufficient documentation

## 2019-05-10 DIAGNOSIS — K219 Gastro-esophageal reflux disease without esophagitis: Secondary | ICD-10-CM | POA: Diagnosis not present

## 2019-05-10 DIAGNOSIS — K5090 Crohn's disease, unspecified, without complications: Secondary | ICD-10-CM | POA: Diagnosis not present

## 2019-05-10 DIAGNOSIS — Z888 Allergy status to other drugs, medicaments and biological substances status: Secondary | ICD-10-CM | POA: Insufficient documentation

## 2019-05-10 DIAGNOSIS — I10 Essential (primary) hypertension: Secondary | ICD-10-CM | POA: Insufficient documentation

## 2019-05-10 DIAGNOSIS — Z808 Family history of malignant neoplasm of other organs or systems: Secondary | ICD-10-CM | POA: Diagnosis not present

## 2019-05-10 DIAGNOSIS — K5900 Constipation, unspecified: Secondary | ICD-10-CM | POA: Insufficient documentation

## 2019-05-10 DIAGNOSIS — Z8349 Family history of other endocrine, nutritional and metabolic diseases: Secondary | ICD-10-CM | POA: Insufficient documentation

## 2019-05-10 DIAGNOSIS — R0600 Dyspnea, unspecified: Secondary | ICD-10-CM | POA: Diagnosis not present

## 2019-05-10 DIAGNOSIS — C3492 Malignant neoplasm of unspecified part of left bronchus or lung: Secondary | ICD-10-CM | POA: Insufficient documentation

## 2019-05-10 DIAGNOSIS — Z90710 Acquired absence of both cervix and uterus: Secondary | ICD-10-CM | POA: Diagnosis not present

## 2019-05-10 DIAGNOSIS — D649 Anemia, unspecified: Secondary | ICD-10-CM | POA: Insufficient documentation

## 2019-05-10 DIAGNOSIS — Z885 Allergy status to narcotic agent status: Secondary | ICD-10-CM | POA: Diagnosis not present

## 2019-05-10 DIAGNOSIS — X58XXXA Exposure to other specified factors, initial encounter: Secondary | ICD-10-CM | POA: Insufficient documentation

## 2019-05-10 DIAGNOSIS — M1990 Unspecified osteoarthritis, unspecified site: Secondary | ICD-10-CM | POA: Diagnosis not present

## 2019-05-10 DIAGNOSIS — T80219D Unspecified infection due to central venous catheter, subsequent encounter: Secondary | ICD-10-CM | POA: Diagnosis not present

## 2019-05-10 DIAGNOSIS — R0789 Other chest pain: Secondary | ICD-10-CM

## 2019-05-10 DIAGNOSIS — G8918 Other acute postprocedural pain: Secondary | ICD-10-CM

## 2019-05-10 DIAGNOSIS — Z452 Encounter for adjustment and management of vascular access device: Secondary | ICD-10-CM | POA: Diagnosis present

## 2019-05-10 HISTORY — PX: PORT-A-CATH REMOVAL: SHX5289

## 2019-05-10 LAB — SURGICAL PCR SCREEN
MRSA, PCR: NEGATIVE
Staphylococcus aureus: NEGATIVE

## 2019-05-10 LAB — PROTIME-INR
INR: 1.1 (ref 0.8–1.2)
Prothrombin Time: 14.4 seconds (ref 11.4–15.2)

## 2019-05-10 LAB — APTT: aPTT: 28 seconds (ref 24–36)

## 2019-05-10 SURGERY — REMOVAL PORT-A-CATH
Anesthesia: Monitor Anesthesia Care | Site: Chest | Laterality: Left

## 2019-05-10 MED ORDER — FENTANYL CITRATE (PF) 100 MCG/2ML IJ SOLN
INTRAMUSCULAR | Status: DC | PRN
  Administered 2019-05-10 (×2): 50 ug via INTRAVENOUS

## 2019-05-10 MED ORDER — TRAMADOL HCL 50 MG PO TABS
50.0000 mg | ORAL_TABLET | Freq: Four times a day (QID) | ORAL | Status: DC | PRN
  Administered 2019-05-10: 50 mg via ORAL

## 2019-05-10 MED ORDER — LIDOCAINE-EPINEPHRINE 1 %-1:100000 IJ SOLN
INTRAMUSCULAR | Status: DC | PRN
  Administered 2019-05-10: 20 mL

## 2019-05-10 MED ORDER — ONDANSETRON HCL 4 MG/2ML IJ SOLN
INTRAMUSCULAR | Status: AC
  Filled 2019-05-10: qty 4

## 2019-05-10 MED ORDER — TRAMADOL HCL 50 MG PO TABS
ORAL_TABLET | ORAL | Status: AC
  Filled 2019-05-10: qty 1

## 2019-05-10 MED ORDER — MIDAZOLAM HCL 2 MG/2ML IJ SOLN
INTRAMUSCULAR | Status: AC
  Filled 2019-05-10: qty 2

## 2019-05-10 MED ORDER — PROPOFOL 500 MG/50ML IV EMUL
INTRAVENOUS | Status: DC | PRN
  Administered 2019-05-10: 100 ug/kg/min via INTRAVENOUS

## 2019-05-10 MED ORDER — FENTANYL CITRATE (PF) 100 MCG/2ML IJ SOLN: 25.0000 ug | INTRAMUSCULAR | Status: DC | PRN

## 2019-05-10 MED ORDER — SODIUM CHLORIDE 0.9% FLUSH: 3.0000 mL | INTRAVENOUS | Status: DC | PRN

## 2019-05-10 MED ORDER — FENTANYL CITRATE (PF) 100 MCG/2ML IJ SOLN
25.0000 ug | INTRAMUSCULAR | Status: DC | PRN
  Administered 2019-05-10: 25 ug via INTRAVENOUS

## 2019-05-10 MED ORDER — MIDAZOLAM HCL 5 MG/5ML IJ SOLN
INTRAMUSCULAR | Status: DC | PRN
  Administered 2019-05-10: 2 mg via INTRAVENOUS

## 2019-05-10 MED ORDER — CEFAZOLIN SODIUM-DEXTROSE 2-4 GM/100ML-% IV SOLN
INTRAVENOUS | Status: AC
  Filled 2019-05-10: qty 100

## 2019-05-10 MED ORDER — LIDOCAINE-EPINEPHRINE 1 %-1:100000 IJ SOLN
INTRAMUSCULAR | Status: AC
  Filled 2019-05-10: qty 1

## 2019-05-10 MED ORDER — PROMETHAZINE HCL 25 MG/ML IJ SOLN: 6.2500 mg | INTRAMUSCULAR | Status: DC | PRN

## 2019-05-10 MED ORDER — CEFAZOLIN SODIUM-DEXTROSE 2-4 GM/100ML-% IV SOLN
2.0000 g | INTRAVENOUS | Status: AC
  Administered 2019-05-10: 2 g via INTRAVENOUS

## 2019-05-10 MED ORDER — PROPOFOL 10 MG/ML IV BOLUS
INTRAVENOUS | Status: DC | PRN
  Administered 2019-05-10 (×2): 10 mg via INTRAVENOUS

## 2019-05-10 MED ORDER — SODIUM CHLORIDE 0.9% FLUSH: 3.0000 mL | Freq: Two times a day (BID) | INTRAVENOUS | Status: DC

## 2019-05-10 MED ORDER — FENTANYL CITRATE (PF) 250 MCG/5ML IJ SOLN
INTRAMUSCULAR | Status: AC
  Filled 2019-05-10: qty 5

## 2019-05-10 MED ORDER — TRAMADOL HCL 50 MG PO TABS: 50.0000 mg | ORAL_TABLET | Freq: Four times a day (QID) | ORAL | 0 refills | Status: DC | PRN

## 2019-05-10 MED ORDER — ONDANSETRON HCL 4 MG/2ML IJ SOLN
INTRAMUSCULAR | Status: DC | PRN
  Administered 2019-05-10: 4 mg via INTRAVENOUS

## 2019-05-10 MED ORDER — SODIUM CHLORIDE 0.9 % IR SOLN
Status: DC | PRN
  Administered 2019-05-10: 1000 mL

## 2019-05-10 MED ORDER — MUPIROCIN 2 % EX OINT
TOPICAL_OINTMENT | CUTANEOUS | Status: AC
  Administered 2019-05-10: 1
  Filled 2019-05-10: qty 22

## 2019-05-10 MED ORDER — SODIUM CHLORIDE 0.9 % IV SOLN: 250.0000 mL | INTRAVENOUS | Status: DC | PRN

## 2019-05-10 MED ORDER — PROPOFOL 1000 MG/100ML IV EMUL
INTRAVENOUS | Status: AC
  Filled 2019-05-10: qty 100

## 2019-05-10 MED ORDER — LACTATED RINGERS IV SOLN: INTRAVENOUS | Status: DC

## 2019-05-10 MED ORDER — KETOROLAC TROMETHAMINE 15 MG/ML IJ SOLN: 15.0000 mg | Freq: Four times a day (QID) | INTRAMUSCULAR | Status: DC

## 2019-05-10 MED ORDER — LIDOCAINE 2% (20 MG/ML) 5 ML SYRINGE
INTRAMUSCULAR | Status: DC | PRN
  Administered 2019-05-10 (×2): 40 mg via INTRAVENOUS

## 2019-05-10 MED ORDER — CEPHALEXIN 500 MG PO CAPS: 500.0000 mg | ORAL_CAPSULE | Freq: Three times a day (TID) | ORAL | 0 refills | Status: DC

## 2019-05-10 MED ORDER — LIDOCAINE 2% (20 MG/ML) 5 ML SYRINGE
INTRAMUSCULAR | Status: AC
  Filled 2019-05-10: qty 5

## 2019-05-10 MED ORDER — FENTANYL CITRATE (PF) 100 MCG/2ML IJ SOLN
INTRAMUSCULAR | Status: AC
  Filled 2019-05-10: qty 2

## 2019-05-10 MED ORDER — MUPIROCIN 2 % EX OINT: TOPICAL_OINTMENT | Freq: Once | CUTANEOUS | Status: DC

## 2019-05-10 MED FILL — CEPHALEXIN 500 MG CAPSULE: 500 | 7 days supply | Qty: 21 | Fill #0

## 2019-05-10 MED FILL — traMADol HCL 50 MG TABS: 50 | 10 days supply | Qty: 40 | Fill #0

## 2019-05-10 SURGICAL SUPPLY — 41 items
BAG DECANTER FOR FLEXI CONT (MISCELLANEOUS) ×3 IMPLANT
BLADE CLIPPER SURG (BLADE) ×3 IMPLANT
BLADE SURG 11 STRL SS (BLADE) ×3 IMPLANT
CANISTER SUCT 3000ML PPV (MISCELLANEOUS) ×3 IMPLANT
COVER SURGICAL LIGHT HANDLE (MISCELLANEOUS) ×6 IMPLANT
DRAPE LAPAROTOMY T 102X78X121 (DRAPES) ×3 IMPLANT
DRSG AQUACEL AG ADV 3.5X14 (GAUZE/BANDAGES/DRESSINGS) ×3 IMPLANT
DRSG OPSITE POSTOP 3X4 (GAUZE/BANDAGES/DRESSINGS) ×2 IMPLANT
ELECT CAUTERY BLADE 6.4 (BLADE) ×3 IMPLANT
ELECT REM PT RETURN 9FT ADLT (ELECTROSURGICAL) ×3
ELECTRODE REM PT RTRN 9FT ADLT (ELECTROSURGICAL) ×1 IMPLANT
GAUZE SPONGE 4X4 12PLY STRL (GAUZE/BANDAGES/DRESSINGS) ×3 IMPLANT
GAUZE SPONGE 4X4 12PLY STRL LF (GAUZE/BANDAGES/DRESSINGS) ×2 IMPLANT
GAUZE XEROFORM 1X8 LF (GAUZE/BANDAGES/DRESSINGS) ×2 IMPLANT
GLOVE BIO SURGEON STRL SZ 6.5 (GLOVE) ×2 IMPLANT
GLOVE BIO SURGEON STRL SZ7.5 (GLOVE) ×6 IMPLANT
GLOVE BIO SURGEONS STRL SZ 6.5 (GLOVE) ×2
GLOVE BIOGEL PI IND STRL 6.5 (GLOVE) IMPLANT
GLOVE BIOGEL PI INDICATOR 6.5 (GLOVE) ×4
GOWN STRL REUS W/ TWL LRG LVL3 (GOWN DISPOSABLE) ×1 IMPLANT
GOWN STRL REUS W/TWL LRG LVL3 (GOWN DISPOSABLE) ×3
KIT BASIN OR (CUSTOM PROCEDURE TRAY) ×3 IMPLANT
KIT TURNOVER KIT B (KITS) ×3 IMPLANT
NEEDLE 22X1 1/2 (OR ONLY) (NEEDLE) ×3 IMPLANT
NS IRRIG 1000ML POUR BTL (IV SOLUTION) ×3 IMPLANT
PACK GENERAL/GYN (CUSTOM PROCEDURE TRAY) ×3 IMPLANT
PAD ARMBOARD 7.5X6 YLW CONV (MISCELLANEOUS) ×6 IMPLANT
SUT ETHILON 3 0 PS 1 (SUTURE) ×2 IMPLANT
SUT SILK 2 0 SH (SUTURE) ×3 IMPLANT
SUT SILK 3 0 (SUTURE) ×3
SUT SILK 3-0 18XBRD TIE 12 (SUTURE) IMPLANT
SUT VIC AB 3-0 SH 27 (SUTURE) ×3
SUT VIC AB 3-0 SH 27X BRD (SUTURE) ×1 IMPLANT
SUT VIC AB 3-0 SH 8-18 (SUTURE) ×4 IMPLANT
SWAB COLLECTION DEVICE MRSA (MISCELLANEOUS) ×2 IMPLANT
SWAB CULTURE ESWAB REG 1ML (MISCELLANEOUS) ×2 IMPLANT
SYR 20ML LL LF (SYRINGE) ×3 IMPLANT
SYR CONTROL 10ML LL (SYRINGE) ×3 IMPLANT
TOWEL GREEN STERILE (TOWEL DISPOSABLE) ×3 IMPLANT
TOWEL GREEN STERILE FF (TOWEL DISPOSABLE) ×3 IMPLANT
WATER STERILE IRR 1000ML POUR (IV SOLUTION) ×3 IMPLANT

## 2019-05-10 NOTE — Anesthesia Postprocedure Evaluation (Signed)
Anesthesia Post Note  Patient: Nancy Moore  Procedure(s) Performed: REMOVAL PORT-A-CATH (Left Chest)     Patient location during evaluation: PACU Anesthesia Type: MAC Level of consciousness: awake and alert Pain management: pain level controlled Vital Signs Assessment: post-procedure vital signs reviewed and stable Respiratory status: spontaneous breathing, nonlabored ventilation, respiratory function stable and patient connected to nasal cannula oxygen Cardiovascular status: stable and blood pressure returned to baseline Postop Assessment: no apparent nausea or vomiting Anesthetic complications: no    Last Vitals:  Vitals:   05/10/19 1330 05/10/19 1335  BP:  117/77  Pulse: 93 88  Resp: 15 18  Temp:  36.7 C  SpO2: 97% 96%    Last Pain:  Vitals:   05/10/19 1324  TempSrc:   PainSc: 0-No pain                 [name] S

## 2019-05-10 NOTE — Anesthesia Procedure Notes (Signed)
Procedure Name: MAC Date/Time: 05/10/2019 12:03 PM Performed by: Moshe Salisbury, CRNA Pre-anesthesia Checklist: Patient identified, Emergency Drugs available, Suction available and Patient being monitored Patient Re-evaluated:Patient Re-evaluated prior to induction Oxygen Delivery Method: Nasal cannula Placement Confirmation: positive ETCO2 Dental Injury: Teeth and Oropharynx as per pre-operative assessment

## 2019-05-10 NOTE — Discharge Instructions (Addendum)
No driving for 24 hrs Keep dressing intact until thurs am Cover with new guaze daily for a week May take a  dailyshower beginning Thursday before dressing change Office will call for appt, suture removal in 2 weeks Continue current Augmentin until finished then refill Pain med and new antibiotic Rx will be called to pharmacy

## 2019-05-10 NOTE — H&P (Signed)
PCP is Copland, Gay Filler, MD Referring Provider is No ref. provider found  No chief complaint on file.   HPI: Patient presents for evaluation of her left Port-A-Cath placed for stage IV lung cancer.  Catheter reservoir has been noted to erode through the skin and is exposed.  Catheter will need to be removed in the operating room.  She is currently on oral Augmentin without fever or signs of infection.  She has not used a Port-A-Cath in several days.  We will plan on doing outpatient removal of left Port-A-Cath early next week-April 13.  I have discussed the procedure benefits and risks with her.  She understands if we find the capsule to be infected we may need to leave the incision open.  Patient is on chronic oral chemotherapy   Past Medical History:  Diagnosis Date  . Adenocarcinoma of left lung, stage 4 ([place]) 10/28/2016  . Anemia   . Arthritis   . Constipation   . Crohn's disease ([place])    in remission 35 years +  . Dyspnea    gets "winded"  . GERD (gastroesophageal reflux disease)   . Goals of care, counseling/discussion 10/28/2016  . History of hiatal hernia    noted on CT 12/20  . Hypertension   . Pneumonia    walking pneumonia in the late 80's  . Recurrent pleural effusion on left     Past Surgical History:  Procedure Laterality Date  . ABDOMINAL HYSTERECTOMY     partial hysterectomy  . BREAST EXCISIONAL BIOPSY Left 20+ yrs ago   benign  . CHEST TUBE INSERTION Left 10/26/2018   Procedure: INSERTION PLEURAL DRAINAGE CATHETER;  Surgeon: Ivin Poot, MD;  Location: [place];  Service: Thoracic;  Laterality: Left;  . COLONOSCOPY    . PERICARDIAL WINDOW N/A 01/10/2017   Procedure: PERICARDIAL WINDOW- SUB XYPHOID, RIGHT CHEST TUBE;  Surgeon: Ivin Poot, MD;  Location: [place];  Service: Thoracic;  Laterality: N/A;  . PORTACATH PLACEMENT Left 02/21/2019   Procedure: INSERTION PORT-A-CATH;  Surgeon: Ivin Poot, MD;  Location: [place];  Service: Thoracic;  Laterality:  Left;  . REMOVAL OF PLEURAL DRAINAGE CATHETER Left 02/21/2019   Procedure: REMOVAL OF PLEURAL DRAINAGE CATHETER;  Surgeon: Ivin Poot, MD;  Location: [place];  Service: Thoracic;  Laterality: Left;  Marland Kitchen VIDEO BRONCHOSCOPY Bilateral 10/21/2016   Procedure: VIDEO BRONCHOSCOPY WITH FLUORO;  Surgeon: Tanda Rockers, MD;  [facility];  Service: Cardiopulmonary;  Laterality: Bilateral;    Family History  Problem Relation Age of Onset  . Asthma Mother   . Stroke Mother   . Hypertension Mother   . Heart attack Father   . Hypertension Father   . Hyperlipidemia Father   . Dementia Father   . Emphysema Maternal Grandmother   . Hypertension Maternal Grandmother   . Colon cancer Paternal Grandmother   . Brain cancer Maternal Uncle     Social History Social History   Tobacco Use  . Smoking status: Never Smoker  . Smokeless tobacco: Never Used  Substance Use Topics  . Alcohol use: Not Currently    Comment: socially  . Drug use: No    Current Facility-Administered Medications  Medication Dose Route Frequency Provider Last Rate Last Admin  . ceFAZolin (ANCEF) 2-4 GM/100ML-% IVPB           . ceFAZolin (ANCEF) IVPB 2g/100 mL premix  2 g Intravenous 30 min Pre-Op Ivin Poot, MD      .  lactated ringers infusion   Intravenous Continuous Myrtie Soman, MD 10 mL/hr at 05/10/19 1109 New Bag at 05/10/19 1109  . mupirocin ointment (BACTROBAN) 2 %   Nasal Once Prescott Gum, Collier Salina, MD      . sodium chloride irrigation 0.9 %    PRN Prescott Gum, Collier Salina, MD   1,000 mL at 05/10/19 1110    Allergies  Allergen Reactions  . Percocet [Oxycodone-Acetaminophen] Other (See Comments)    "makes me dizzy" / takes TID at home  . Lisinopril Cough  . Losartan Cough    Review of Systems  No fever chills, nausea or vomiting No shortness of breath Patient previously had a left Pleurx catheter for malignant effusion but that has been removed.  Last chest x-ray shows no significant left pleural  effusion.   BP 117/73   Pulse 99   Temp 97.6 F (36.4 C) (Oral)   Resp 20   Ht 5' 6"  (1.676 m)   Wt 87.1 kg   LMP 03/15/2010   SpO2 100%   BMI 30.99 kg/m  Physical Exam      Exam    General- alert and comfortable    Neck- no JVD, no cervical adenopathy palpable, no carotid bruit   Lungs- clear without rales, wheezes   Cor- regular rate and rhythm, no murmur , gallop   Abdomen- soft, non-tender   Extremities - warm, non-tender, minimal edema   Neuro- oriented, appropriate, no focal weakness   Diagnostic Tests: None  Impression: Exposed left Port-A-Cath will need to be removed Scheduled for surgery outpatient at [facility] April 13   plan: Return for outpatient surgery April 13 to remove Barker Ten [place] III, MD [facility]  4-13 update Pre Procedure note for inpatients:   Nancy Moore has been scheduled for Procedure(s): REMOVAL PORT-A-CATH (Left) today. The various methods of treatment have been discussed with the patient. After consideration of the risks, benefits and treatment options the patient has consented to the planned procedure.   The patient has been seen and labs reviewed. There are no changes in the patient's condition to prevent proceeding with the planned procedure today.  Recent labs:  Lab Results  Component Value Date   WBC 3.7 (L) 05/05/2019   HGB 9.7 (L) 05/05/2019   HCT 32.2 (L) 05/05/2019   PLT 280 05/05/2019   GLUCOSE 112 (H) 05/05/2019   CHOL 210 (H) 03/26/2018   TRIG 67.0 03/26/2018   HDL 71.30 03/26/2018   LDLCALC 125 (H) 03/26/2018   ALT 12 05/05/2019   AST 18 05/05/2019   NA 142 05/05/2019   K 4.0 05/05/2019   CL 104 05/05/2019   CREATININE 1.08 (H) 05/05/2019   BUN 11 05/05/2019   CO2 29 05/05/2019   TSH 3.637 10/14/2018   INR 1.1 05/10/2019    Ivin Poot III, MD 05/10/2019 11:37 AM      [phone]

## 2019-05-10 NOTE — Brief Op Note (Signed)
05/10/2019  12:56 PM  PATIENT:  Nancy Moore  54 y.o. female  PRE-OPERATIVE DIAGNOSIS:  HX OF LUNG CANCER EXPOSED PORTA-CATH  POST-OPERATIVE DIAGNOSIS:  HX-Lung Cancer, exposed porta-cath.  PROCEDURE:  Procedure(s): REMOVAL PORT-A-CATH (Left)  SURGEON:  Surgeon(s) and Role:    Ivin Poot, MD - Primary  PHYSICIAN ASSISTANT:   ASSISTANTS:RN  ANESTHESIA:   MAC  EBL:  5 mL   BLOOD ADMINISTERED:none  DRAINS: none   LOCAL MEDICATIONS USED:  LIDOCAINE  and Amount: 15 ml  SPECIMEN:  No Specimen  DISPOSITION OF SPECIMEN:  wound culture to micro lab  COUNTS:  YES  TOURNIQUET:  * No tourniquets in log *  DICTATION: .Dragon Dictation  PLAN OF CARE: Discharge to home after PACU  PATIENT DISPOSITION:  PACU - hemodynamically stable.   Delay start of Pharmacological VTE agent (>24hrs) due to surgical blood loss or risk of bleeding: yes

## 2019-05-10 NOTE — Transfer of Care (Signed)
Immediate Anesthesia Transfer of Care Note  Patient: Nancy Moore  Procedure(s) Performed: REMOVAL PORT-A-CATH (Left Chest)  Patient Location: PACU  Anesthesia Type:MAC  Level of Consciousness: drowsy and patient cooperative  Airway & Oxygen Therapy: Patient Spontanous Breathing and Patient connected to nasal cannula oxygen  Post-op Assessment: Report given to RN, Post -op Vital signs reviewed and stable and Patient moving all extremities  Post vital signs: Reviewed and stable  Last Vitals:  Vitals Value Taken Time  BP 110/73 05/10/19 1255  Temp 36.7 C 05/10/19 1254  Pulse 107 05/10/19 1256  Resp 18 05/10/19 1256  SpO2 100 % 05/10/19 1256  Vitals shown include unvalidated device data.  Last Pain:  Vitals:   05/10/19 1254  TempSrc:   PainSc: 5       Patients Stated Pain Goal: 5 (47/12/52 7129)  Complications: No apparent anesthesia complications

## 2019-05-10 NOTE — Anesthesia Preprocedure Evaluation (Signed)
Anesthesia Evaluation  Patient identified by MRN, date of birth, ID band Patient awake    Reviewed: Allergy & Precautions, NPO status , Patient's Chart, lab work & pertinent test results  Airway Mallampati: II  TM Distance: >3 FB Neck ROM: Full    Dental no notable dental hx.    Pulmonary  Lung cancer   Pulmonary exam normal breath sounds clear to auscultation       Cardiovascular hypertension, Normal cardiovascular exam Rhythm:Regular Rate:Normal     Neuro/Psych negative neurological ROS  negative psych ROS   GI/Hepatic Neg liver ROS, GERD  Medicated,  Endo/Other  negative endocrine ROS  Renal/GU negative Renal ROS  negative genitourinary   Musculoskeletal negative musculoskeletal ROS (+)   Abdominal   Peds negative pediatric ROS (+)  Hematology  (+) anemia ,   Anesthesia Other Findings   Reproductive/Obstetrics negative OB ROS                             Anesthesia Physical Anesthesia Plan  ASA: III  Anesthesia Plan: MAC   Post-op Pain Management:    Induction: Intravenous  PONV Risk Score and Plan: 0 and Ondansetron and Treatment may vary due to age or medical condition  Airway Management Planned: Simple Face Mask  Additional Equipment:   Intra-op Plan:   Post-operative Plan:   Informed Consent: I have reviewed the patients History and Physical, chart, labs and discussed the procedure including the risks, benefits and alternatives for the proposed anesthesia with the patient or authorized representative who has indicated his/her understanding and acceptance.     Dental advisory given  Plan Discussed with: CRNA and Surgeon  Anesthesia Plan Comments:         Anesthesia Quick Evaluation

## 2019-05-10 NOTE — Op Note (Signed)
NAME: STEPHANIE, LITTMAN MEDICAL RECORD LY:[id number] ACCOUNT [id number] DATE OF BIRTH:12-02-1965 FACILITY: [facility] LOCATION: [facility] PHYSICIAN:Antawn Sison VAN TRIGT III, MD  OPERATIVE REPORT  DATE OF PROCEDURE:  05/10/2019  OPERATION:  Removal of left Port-A-Cath.  SURGEON:  Ivin Poot, MD  PREOPERATIVE DIAGNOSIS:  History of lung cancer and previously placed Port-A-Cath.  POSTOPERATIVE DIAGNOSIS:  History of lung cancer and previously placed Port-A-Cath.  ANESTHESIA:  MAC, 1% local lidocaine.  DESCRIPTION OF PROCEDURE:  The patient was brought from preop holding where informed consent was documented and the proper site marked.  The patient was brought to the operating room and placed supine on the operating table.  IV conscious sedation  monitored by anesthesia was initiated.  The left chest was prepped and draped as a sterile field.  A proper time-out was performed.  One percent lidocaine was infiltrated in the previous incision over the left Port-A-Cath.  The previous incision was  excised to clean tissue.  The Port-A-Cath was visualized.  The Port-A-Cath was removed in its entirety.  Next, the capsule of the Port-A-Cath was excised using electrocautery and the wound was irrigated.  The wound was then closed with interrupted 3-0 subcutaneous Vicryl sutures, followed by interrupted 3-0 nylon skin sutures.  A sterile dressing was applied.  The patient returned to the recovery room in stable condition.  VN/NUANCE  D:05/10/2019 T:05/10/2019 JOB:[id number]

## 2019-05-12 ENCOUNTER — Other Ambulatory Visit: Payer: Self-pay

## 2019-05-12 ENCOUNTER — Other Ambulatory Visit: Payer: Self-pay | Admitting: Internal Medicine

## 2019-05-12 ENCOUNTER — Inpatient Hospital Stay: Payer: Medicare Other

## 2019-05-12 VITALS — BP 127/80 | HR 88 | Temp 98.3°F | Resp 18 | Wt 197.5 lb

## 2019-05-12 DIAGNOSIS — C3492 Malignant neoplasm of unspecified part of left bronchus or lung: Secondary | ICD-10-CM

## 2019-05-12 DIAGNOSIS — C3490 Malignant neoplasm of unspecified part of unspecified bronchus or lung: Secondary | ICD-10-CM

## 2019-05-12 DIAGNOSIS — Z5111 Encounter for antineoplastic chemotherapy: Secondary | ICD-10-CM | POA: Diagnosis not present

## 2019-05-12 LAB — CMP (CANCER CENTER ONLY)
ALT: 11 U/L (ref 0–44)
AST: 20 U/L (ref 15–41)
Albumin: 3.6 g/dL (ref 3.5–5.0)
Alkaline Phosphatase: 108 U/L (ref 38–126)
Anion gap: 12 (ref 5–15)
BUN: 9 mg/dL (ref 6–20)
CO2: 24 mmol/L (ref 22–32)
Calcium: 9.8 mg/dL (ref 8.9–10.3)
Chloride: 104 mmol/L (ref 98–111)
Creatinine: 0.88 mg/dL (ref 0.44–1.00)
GFR, Est AFR Am: >60 mL/min (ref >60)
GFR, Estimated: >60 mL/min (ref >60)
Glucose, Bld: 127 mg/dL — ABNORMAL HIGH (ref 70–99)
Potassium: 4 mmol/L (ref 3.5–5.1)
Sodium: 140 mmol/L (ref 135–145)
Total Bilirubin: 0.3 mg/dL (ref 0.3–1.2)
Total Protein: 7.5 g/dL (ref 6.5–8.1)

## 2019-05-12 LAB — CBC WITH DIFFERENTIAL (CANCER CENTER ONLY)
Abs Immature Granulocytes: 0.09 10*3/uL — ABNORMAL HIGH (ref 0.00–0.07)
Basophils Absolute: 0 10*3/uL (ref 0.0–0.1)
Basophils Relative: 0 %
Eosinophils Absolute: 0 10*3/uL (ref 0.0–0.5)
Eosinophils Relative: 0 %
HCT: 36.2 % (ref 36.0–46.0)
Hemoglobin: 10.9 g/dL — ABNORMAL LOW (ref 12.0–15.0)
Immature Granulocytes: 1 %
Lymphocytes Relative: 11 %
Lymphs Abs: 1.4 10*3/uL (ref 0.7–4.0)
MCH: 28.9 pg (ref 26.0–34.0)
MCHC: 30.1 g/dL (ref 30.0–36.0)
MCV: 96 fL (ref 80.0–100.0)
Monocytes Absolute: 1.7 10*3/uL — ABNORMAL HIGH (ref 0.1–1.0)
Monocytes Relative: 13 %
Neutro Abs: 9.9 10*3/uL — ABNORMAL HIGH (ref 1.7–7.7)
Neutrophils Relative %: 75 %
Platelet Count: 317 10*3/uL (ref 150–400)
RBC: 3.77 MIL/uL — ABNORMAL LOW (ref 3.87–5.11)
RDW: 23.1 % — ABNORMAL HIGH (ref 11.5–15.5)
WBC Count: 13 10*3/uL — ABNORMAL HIGH (ref 4.0–10.5)
nRBC: 0 % (ref 0.0–0.2)

## 2019-05-12 MED ORDER — SODIUM CHLORIDE 0.9 % IV SOLN
800.0000 mg | Freq: Once | INTRAVENOUS | Status: AC
  Administered 2019-05-12: 800 mg via INTRAVENOUS
  Filled 2019-05-12: qty 12

## 2019-05-12 MED ORDER — SODIUM CHLORIDE 0.9 % IV SOLN
150.0000 mg | Freq: Once | INTRAVENOUS | Status: AC
  Administered 2019-05-12: 150 mg via INTRAVENOUS
  Filled 2019-05-12: qty 150

## 2019-05-12 MED ORDER — SODIUM CHLORIDE 0.9 % IV SOLN
Freq: Once | INTRAVENOUS | Status: AC
  Filled 2019-05-12: qty 250

## 2019-05-12 MED ORDER — SODIUM CHLORIDE 0.9 % IV SOLN
10.0000 mg | Freq: Once | INTRAVENOUS | Status: AC
  Administered 2019-05-12: 10 mg via INTRAVENOUS
  Filled 2019-05-12: qty 10

## 2019-05-12 MED ORDER — SODIUM CHLORIDE 0.9 % IV SOLN
563.2000 mg | Freq: Once | INTRAVENOUS | Status: AC
  Administered 2019-05-12: 560 mg via INTRAVENOUS
  Filled 2019-05-12: qty 56

## 2019-05-12 MED ORDER — PALONOSETRON HCL INJECTION 0.25 MG/5ML
INTRAVENOUS | Status: AC
  Filled 2019-05-12: qty 5

## 2019-05-12 MED ORDER — PALONOSETRON HCL INJECTION 0.25 MG/5ML
0.2500 mg | Freq: Once | INTRAVENOUS | Status: AC
  Administered 2019-05-12: 0.25 mg via INTRAVENOUS

## 2019-05-12 NOTE — Patient Instructions (Signed)
[facility] Discharge Instructions for Patients Receiving Chemotherapy  Today you received the following chemotherapy agents: pemetrexed (ALIMTA) and carboplatin.  To help prevent nausea and vomiting after your treatment, we encourage you to take your nausea medication as directed.   If you develop nausea and vomiting that is not controlled by your nausea medication, call the clinic.   BELOW ARE SYMPTOMS THAT SHOULD BE REPORTED IMMEDIATELY:  *FEVER GREATER THAN 100.5 F  *CHILLS WITH OR WITHOUT FEVER  NAUSEA AND VOMITING THAT IS NOT CONTROLLED WITH YOUR NAUSEA MEDICATION  *UNUSUAL SHORTNESS OF BREATH  *UNUSUAL BRUISING OR BLEEDING  TENDERNESS IN MOUTH AND THROAT WITH OR WITHOUT PRESENCE OF ULCERS  *URINARY PROBLEMS  *BOWEL PROBLEMS  UNUSUAL RASH Items with * indicate a potential emergency and should be followed up as soon as possible.  Feel free to call the clinic should you have any questions or concerns. The clinic phone number is [phone].  Please show the [place] at check-in to the Emergency Department and triage nurse.

## 2019-05-12 NOTE — Progress Notes (Signed)
Per Dr. Julien Nordmann, hold bevacizumab today due to recent [facility] removal.

## 2019-05-15 LAB — AEROBIC/ANAEROBIC CULTURE W GRAM STAIN (SURGICAL/DEEP WOUND)
Culture: NO GROWTH
Gram Stain: NONE SEEN

## 2019-05-19 ENCOUNTER — Inpatient Hospital Stay: Payer: Medicare Other

## 2019-05-19 ENCOUNTER — Other Ambulatory Visit: Payer: Self-pay

## 2019-05-19 DIAGNOSIS — Z5111 Encounter for antineoplastic chemotherapy: Secondary | ICD-10-CM | POA: Diagnosis not present

## 2019-05-19 DIAGNOSIS — C3492 Malignant neoplasm of unspecified part of left bronchus or lung: Secondary | ICD-10-CM

## 2019-05-19 LAB — CMP (CANCER CENTER ONLY)
ALT: 16 U/L (ref 0–44)
AST: 21 U/L (ref 15–41)
Albumin: 3.5 g/dL (ref 3.5–5.0)
Alkaline Phosphatase: 82 U/L (ref 38–126)
Anion gap: 9 (ref 5–15)
BUN: 13 mg/dL (ref 6–20)
CO2: 27 mmol/L (ref 22–32)
Calcium: 9.3 mg/dL (ref 8.9–10.3)
Chloride: 101 mmol/L (ref 98–111)
Creatinine: 0.82 mg/dL (ref 0.44–1.00)
GFR, Est AFR Am: >60 mL/min (ref >60)
GFR, Estimated: >60 mL/min (ref >60)
Glucose, Bld: 111 mg/dL — ABNORMAL HIGH (ref 70–99)
Potassium: 4.5 mmol/L (ref 3.5–5.1)
Sodium: 137 mmol/L (ref 135–145)
Total Bilirubin: 0.9 mg/dL (ref 0.3–1.2)
Total Protein: 6.8 g/dL (ref 6.5–8.1)

## 2019-05-19 LAB — CBC WITH DIFFERENTIAL (CANCER CENTER ONLY)
Abs Immature Granulocytes: 0.03 10*3/uL (ref 0.00–0.07)
Basophils Absolute: 0 10*3/uL (ref 0.0–0.1)
Basophils Relative: 0 %
Eosinophils Absolute: 0.2 10*3/uL (ref 0.0–0.5)
Eosinophils Relative: 2 %
HCT: 32.1 % — ABNORMAL LOW (ref 36.0–46.0)
Hemoglobin: 9.8 g/dL — ABNORMAL LOW (ref 12.0–15.0)
Immature Granulocytes: 0 %
Lymphocytes Relative: 14 %
Lymphs Abs: 1 10*3/uL (ref 0.7–4.0)
MCH: 29 pg (ref 26.0–34.0)
MCHC: 30.5 g/dL (ref 30.0–36.0)
MCV: 95 fL (ref 80.0–100.0)
Monocytes Absolute: 0.4 10*3/uL (ref 0.1–1.0)
Monocytes Relative: 5 %
Neutro Abs: 6 10*3/uL (ref 1.7–7.7)
Neutrophils Relative %: 79 %
Platelet Count: 181 10*3/uL (ref 150–400)
RBC: 3.38 MIL/uL — ABNORMAL LOW (ref 3.87–5.11)
RDW: 21.6 % — ABNORMAL HIGH (ref 11.5–15.5)
WBC Count: 7.6 10*3/uL (ref 4.0–10.5)
nRBC: 0 % (ref 0.0–0.2)

## 2019-05-24 ENCOUNTER — Encounter: Payer: Self-pay | Admitting: Internal Medicine

## 2019-05-24 ENCOUNTER — Telehealth: Payer: Self-pay | Admitting: Medical Oncology

## 2019-05-24 ENCOUNTER — Encounter: Payer: Self-pay | Admitting: *Deleted

## 2019-05-24 ENCOUNTER — Other Ambulatory Visit: Payer: Self-pay | Admitting: Family Medicine

## 2019-05-24 ENCOUNTER — Telehealth: Payer: Self-pay

## 2019-05-24 MED FILL — VENTOLIN HFA 90 MCG INHALER: 108 (90 BAS | 25 days supply | Qty: 18 | Fill #1

## 2019-05-24 MED FILL — DEXAMETHASONE 4 MG TABLET: 4 | 21 days supply | Qty: 6 | Fill #4

## 2019-05-24 MED FILL — FLUTICASONE PROP 50 MCG SPR: 50 | 30 days supply | Qty: 16 | Fill #0

## 2019-05-24 NOTE — Telephone Encounter (Signed)
I followed up with patient about new insurance. Patient has Medicare A and B but has not yet picked a prescription plan through Medicare.  She said she would let me know as soon as she did decide and had the billing info.  [place] Patient [place] Phone [phone] Fax [phone] 05/24/2019 11:04 AM

## 2019-05-24 NOTE — Progress Notes (Signed)
Oncology Nurse Navigator Documentation  Oncology Nurse Navigator Flowsheets 05/24/2019  Abnormal Finding Date -  Confirmed Diagnosis Date -  Navigator Location [facility]  Referral Date to RadOnc/MedOnc -  Navigator Encounter Type Telephone/I received a call from Nancy Moore.  She is having a change of insurance and had questions about her out of pocket expenses.  I contacted oral pharmacist and financial advocates.  They will both reach out the Nancy Moore to obtain more information about her insurance and to see about financial resources.   Telephone Incoming Call  Treatment Initiated Date -  Patient Visit Type -  Treatment Phase Treatment  Barriers/Navigation Needs Coordination of Care;Education  Education Other  Interventions Coordination of Care;Education;Psycho-Social Support  Acuity Level 3-Moderate Needs (3-4 Barriers Identified)  Referrals -  Coordination of Care Other  Education Method Verbal  Time Spent with Patient 45

## 2019-05-24 NOTE — Progress Notes (Signed)
Received message from RN navigator regarding patient having concerns with co-insurance.  There is no copay assistance available for her diagnosis/treatment/insurance.  Called patient and provided available options for her to consider such as applying for Medicaid through DSS(patient states she does not qualify), hardship settlement which balance must be at least $5,000 (patient wanted to know if there was a certain percentage they would pay, I advised her I am not aware but they would be able to let her know through the billing department), and Access One where she can combine all of her bills and make one monthly payment which is also through the billing department.  She verbalized understanding.

## 2019-05-24 NOTE — Telephone Encounter (Signed)
LVM to return my call from her call. Schedule message sent to r/s appts.

## 2019-05-26 ENCOUNTER — Telehealth: Payer: Self-pay | Admitting: Internal Medicine

## 2019-05-26 ENCOUNTER — Inpatient Hospital Stay: Payer: Medicare Other

## 2019-05-26 ENCOUNTER — Other Ambulatory Visit: Payer: Self-pay

## 2019-05-26 ENCOUNTER — Inpatient Hospital Stay: Payer: Medicare Other | Admitting: Internal Medicine

## 2019-05-26 ENCOUNTER — Other Ambulatory Visit: Payer: 59

## 2019-05-26 DIAGNOSIS — Z5111 Encounter for antineoplastic chemotherapy: Secondary | ICD-10-CM | POA: Diagnosis not present

## 2019-05-26 DIAGNOSIS — C3492 Malignant neoplasm of unspecified part of left bronchus or lung: Secondary | ICD-10-CM

## 2019-05-26 LAB — CMP (CANCER CENTER ONLY)
ALT: 13 U/L (ref 0–44)
AST: 18 U/L (ref 15–41)
Albumin: 3.5 g/dL (ref 3.5–5.0)
Alkaline Phosphatase: 87 U/L (ref 38–126)
Anion gap: 11 (ref 5–15)
BUN: 8 mg/dL (ref 6–20)
CO2: 26 mmol/L (ref 22–32)
Calcium: 9.4 mg/dL (ref 8.9–10.3)
Chloride: 103 mmol/L (ref 98–111)
Creatinine: 0.79 mg/dL (ref 0.44–1.00)
GFR, Est AFR Am: >60 mL/min (ref >60)
GFR, Estimated: >60 mL/min (ref >60)
Glucose, Bld: 94 mg/dL (ref 70–99)
Potassium: 4.1 mmol/L (ref 3.5–5.1)
Sodium: 140 mmol/L (ref 135–145)
Total Bilirubin: 0.4 mg/dL (ref 0.3–1.2)
Total Protein: 6.9 g/dL (ref 6.5–8.1)

## 2019-05-26 LAB — CBC WITH DIFFERENTIAL (CANCER CENTER ONLY)
Abs Immature Granulocytes: 0.01 10*3/uL (ref 0.00–0.07)
Basophils Absolute: 0 10*3/uL (ref 0.0–0.1)
Basophils Relative: 0 %
Eosinophils Absolute: 0.2 10*3/uL (ref 0.0–0.5)
Eosinophils Relative: 4 %
HCT: 26.9 % — ABNORMAL LOW (ref 36.0–46.0)
Hemoglobin: 8.1 g/dL — ABNORMAL LOW (ref 12.0–15.0)
Immature Granulocytes: 0 %
Lymphocytes Relative: 31 %
Lymphs Abs: 1.2 10*3/uL (ref 0.7–4.0)
MCH: 30 pg (ref 26.0–34.0)
MCHC: 30.1 g/dL (ref 30.0–36.0)
MCV: 99.6 fL (ref 80.0–100.0)
Monocytes Absolute: 0.6 10*3/uL (ref 0.1–1.0)
Monocytes Relative: 14 %
Neutro Abs: 2.1 10*3/uL (ref 1.7–7.7)
Neutrophils Relative %: 51 %
Platelet Count: 89 10*3/uL — ABNORMAL LOW (ref 150–400)
RBC: 2.7 MIL/uL — ABNORMAL LOW (ref 3.87–5.11)
RDW: 20.9 % — ABNORMAL HIGH (ref 11.5–15.5)
WBC Count: 4.1 10*3/uL (ref 4.0–10.5)
nRBC: 0 % (ref 0.0–0.2)

## 2019-05-26 NOTE — Telephone Encounter (Signed)
Scheduled appt per 4/27 sch message - pt is aware of appt date and time .

## 2019-06-01 ENCOUNTER — Other Ambulatory Visit: Payer: Self-pay

## 2019-06-01 ENCOUNTER — Ambulatory Visit (INDEPENDENT_AMBULATORY_CARE_PROVIDER_SITE_OTHER): Payer: Medicare Other | Admitting: Cardiothoracic Surgery

## 2019-06-01 ENCOUNTER — Encounter: Payer: Self-pay | Admitting: Cardiothoracic Surgery

## 2019-06-01 DIAGNOSIS — Z5189 Encounter for other specified aftercare: Secondary | ICD-10-CM | POA: Diagnosis not present

## 2019-06-01 NOTE — Progress Notes (Signed)
[facility] OFFICE PROGRESS NOTE  Copland, Gay Filler, MD [address] [place] [address]  DIAGNOSIS: Stage IV (T3, N2, M1 a) non-small cell lung cancer, poorly differentiated adenocarcinoma with extensive miliary distribution in the lungs bilaterally diagnosed in September 2018. POSITIVE for an Exon 19 deletion mutation.NEGATIVE for the Exon 20 T790M mutation. Repeat molecular studies byguardant 360at time of progression showed ATM [id number] [facility], V600E  PRIOR THERAPY:  1) Gilotrif 40 mg daily started 11/08/2016.Status post154monthof treatment. 2) Tagrisso 80 mg p.o. daily. First dose started January 01, 2018. Status post 12 months of treatment  CURRENT THERAPY: Systemic chemotherapy with carboplatin for AUC 5, Alimta 500 mg/M2 and Avastin 15 mg/KG every 3 weeks. First dose February 10, 2018. Status post 5cycles.   INTERVAL HISTORY: Nancy Moore [age].o. female returns to the clinic today for follow-up visit.  The patient is feeling well  today without any concerning complaints. The patient recently had her Port-A-Cath removed under the care of Dr. VaPrescott Gum The patient has been tolerating her treatment except for nausea and vomiting.  She states he has pretty good control of her nausea with her anti-emetic. She denies any recent fever, chills, night sweats.  She denies any chest pain or hemoptysis.  She reports her baseline dyspnea on exertion. She has a mild cough associated with allergies/nasal drainage and is planning on picking up some claritin today after her infusion. She denies any headache or visual changes. Her bowel habits have been under better control due to recently starting linzess.  She denies any abnormal bleeding or bruising.  She is here today for evaluation before starting cycle #6 of her treatment.   MEDICAL  HISTORY: Past Medical History:  Diagnosis Date  . Adenocarcinoma of left lung, stage 4 ([place] [place]/02/2016  . Anemia   . Arthritis   . Constipation   . Crohn's disease ([facility]   in remission 35 years +  . Dyspnea    gets "winded"  . GERD (gastroesophageal reflux disease)   . Goals of care, counseling/discussion 10/28/2016  . History of hiatal hernia    noted on CT 12/20  . Hypertension   . Pneumonia    walking pneumonia in the late 80's  . Recurrent pleural effusion on left     ALLERGIES:  is allergic to percocet [oxycodone-acetaminophen]; lisinopril; and losartan.  MEDICATIONS:  Current Outpatient Medications  Medication Sig Dispense Refill  . albuterol (VENTOLIN HFA) 108 (90 Base) MCG/ACT inhaler Inhale 2 puffs into the lungs every 6 (six) hours as needed for wheezing or shortness of breath. 8 g 1  . clindamycin (CLINDAGEL) 1 % gel Apply 1 application topically 2 (two) times daily. 30 g 1  . dexamethasone (DECADRON) 4 MG tablet 1 tablet p.o. twice daily the day before, day of and day after chemotherapy every 3 weeks (Patient taking differently: Take 4 mg by mouth See admin instructions. 4 mg  twice daily the day before, day of and day after chemotherapy every 3 weeks) 20 tablet 2  . fluticasone (FLONASE) 50 MCG/ACT nasal spray Place 2 sprays into both nostrils daily as needed for allergies. 16 g 3  . folic acid (FOLVITE) 1 MG tablet Take 1 tablet (1 mg total) by mouth daily. 30 tablet 4  . furosemide (LASIX) 40 MG tablet TAKE 1 TABLET BY MOUTH ONCE DAILY (Patient taking  differently: Take 40 mg by mouth daily. ) 90 tablet 1  . guaiFENesin (MUCINEX) 600 MG 12 hr tablet Take 600 mg by mouth 2 (two) times daily as needed for to loosen phlegm.     Marland Kitchen HYDROcodone-homatropine (HYCODAN) 5-1.5 MG/5ML syrup Take 5 mLs by mouth every 6 (six) hours as needed for cough. 240 mL 0  . lidocaine-prilocaine (EMLA) cream APPLY TO THE AFFECTED AREA(S) AS NEEDED (Patient taking differently: Apply 1  application topically as needed [facility]). ) 30 g 0  . linaclotide (LINZESS) 145 MCG CAPS capsule Take 1 capsule (145 mcg total) by mouth daily before breakfast. 30 capsule 6  . loperamide (IMODIUM) 1 MG/5ML solution Take 10 mLs (2 mg total) by mouth as needed for diarrhea or loose stools. (Patient taking differently: Take 2 mg by mouth daily as needed for diarrhea or loose stools. ) 120 mL 0  . magnesium oxide (MAG-OX) 400 (241.3 Mg) MG tablet Take 1 tablet (400 mg total) by mouth daily.    . methocarbamol (ROBAXIN) 500 MG tablet TAKE 1 TABLET (500 MG TOTAL) BY MOUTH EVERY 8 (EIGHT) HOURS AS NEEDED FOR MUSCLE SPASMS. 30 tablet 5  . nystatin cream (MYCOSTATIN) APPLY 1 APPLICATION TOPICALLY 2 (TWO) TIMES DAILY. (Patient taking differently: Apply 1 application topically daily as needed (skin irritaion). ) 30 g 0  . ondansetron (ZOFRAN) 8 MG tablet TAKE 1 TABLET BY MOUTH EVERY 8 HOURS AS NEEDED FOR NAUSEA OR VOMITING (Patient taking differently: Take 8 mg by mouth every 8 (eight) hours as needed for nausea or vomiting. ) 30 tablet 2  . osimertinib mesylate (TAGRISSO) 80 MG tablet Take 1 tablet (80 mg total) by mouth daily. 30 tablet 1  . Oxycodone HCl 10 MG TABS Take 1 tablet (10 mg total) by mouth 3 (three) times daily as needed. (Patient taking differently: Take 10 mg by mouth 2 (two) times daily as needed (Pain). ) 30 tablet 0  . pantoprazole (PROTONIX) 40 MG tablet TAKE 1 TABLET (40 MG TOTAL) BY MOUTH DAILY AT 12 NOON. (Patient taking differently: Take 40 mg by mouth daily. ) 90 tablet 3  . potassium chloride SA (K-DUR) 20 MEQ tablet TAKE 1 TABLET (20 MEQ TOTAL) BY MOUTH 2 TIMES DAILY. (Patient taking differently: Take 20 mEq by mouth 2 (two) times daily. ) 180 tablet 3  . prochlorperazine (COMPAZINE) 10 MG tablet Take 1 tablet (10 mg total) by mouth every 6 (six) hours as needed for nausea or vomiting. 30 tablet 2  . senna-docusate (SENOKOT-S) 8.6-50 MG tablet Take 1 tablet by mouth at bedtime.    .  traMADol (ULTRAM) 50 MG tablet Take 1 tablet (50 mg total) by mouth every 6 (six) hours as needed. 40 tablet 0   No current facility-administered medications for this visit.    SURGICAL HISTORY:  Past Surgical History:  Procedure Laterality Date  . ABDOMINAL HYSTERECTOMY     partial hysterectomy  . BREAST EXCISIONAL BIOPSY Left 20+ yrs ago   benign  . CHEST TUBE INSERTION Left 10/26/2018   Procedure: INSERTION PLEURAL DRAINAGE CATHETER;  Surgeon: Ivin Poot, MD;  Location: [facility];  Service: Thoracic;  Laterality: Left;  . COLONOSCOPY    . PERICARDIAL WINDOW N/A 01/10/2017   Procedure: PERICARDIAL WINDOW- SUB XYPHOID, RIGHT CHEST TUBE;  Surgeon: Ivin Poot, MD;  Location: [facility];  Service: Thoracic;  Laterality: N/A;  . PORT-A-CATH REMOVAL Left 05/10/2019   Procedure: REMOVAL PORT-A-CATH;  Surgeon: Ivin Poot, MD;  Location: [facility]  OR;  Service: Thoracic;  Laterality: Left;  . PORTACATH PLACEMENT Left 02/21/2019   Procedure: INSERTION PORT-A-CATH;  Surgeon: Ivin Poot, MD;  Location: [facility];  Service: Thoracic;  Laterality: Left;  . REMOVAL OF PLEURAL DRAINAGE CATHETER Left 02/21/2019   Procedure: REMOVAL OF PLEURAL DRAINAGE CATHETER;  Surgeon: Ivin Poot, MD;  Location: [facility];  Service: Thoracic;  Laterality: Left;  Marland Kitchen VIDEO BRONCHOSCOPY Bilateral 10/21/2016   Procedure: VIDEO BRONCHOSCOPY WITH FLUORO;  Surgeon: Tanda Rockers, MD;  Location: [facility];  Service: Cardiopulmonary;  Laterality: Bilateral;    REVIEW OF SYSTEMS:   Review of Systems  Constitutional: Negative for appetite change, chills, fatigue, fever and unexpected weight change.  HENT:   Negative for mouth sores, nosebleeds, sore throat and trouble swallowing.   Eyes: Negative for eye problems and icterus.  Respiratory: Positive for dyspnea on exertion and cough. Negative for  hemoptysis and wheezing.   Cardiovascular: Negative for chest pain and leg swelling.  Gastrointestinal: Positive for  occasional nausea (improved control from prior). Negative for abdominal pain, constipation, diarrhea, and vomiting.  Genitourinary: Negative for bladder incontinence, difficulty urinating, dysuria, frequency and hematuria.   Musculoskeletal: Negative for back pain, gait problem, neck pain and neck stiffness.  Skin: Negative for itching and rash.  Neurological: Negative for dizziness, extremity weakness, gait problem, headaches, light-headedness and seizures.  Hematological: Negative for adenopathy. Does not bruise/bleed easily.  Psychiatric/Behavioral: Negative for confusion, depression and sleep disturbance. The patient is not nervous/anxious.     PHYSICAL EXAMINATION:  Blood pressure 126/78, pulse 95, temperature 98.3 F (36.8 C), temperature source Temporal, resp. rate 18, height _0  (1.676 m), weight 192 lb 8 oz (87.3 kg), last menstrual period 03/15/2010, SpO2 100 %.  ECOG PERFORMANCE STATUS: 1 - Symptomatic but completely ambulatory  Physical Exam  Constitutional: Oriented to person, place, and time and well-developed, well-nourished, and in no distress.  HENT:  Head: Normocephalic and atraumatic.  Mouth/Throat: Oropharynx is clear and moist. No oropharyngeal exudate.  Eyes: Conjunctivae are normal. Right eye exhibits no discharge. Left eye exhibits no discharge. No scleral icterus.  Neck: Normal range of motion. Neck supple.  Cardiovascular: Normal rate, regular rhythm, normal heart sounds and intact distal pulses.   Pulmonary/Chest: Effort normal and breath sounds normal. No respiratory distress. No wheezes. No rales.  Abdominal: Soft. Bowel sounds are normal. Exhibits no distension and no mass. There is no tenderness.  Musculoskeletal: Normal range of motion. Exhibits no edema.  Lymphadenopathy:    No cervical adenopathy.  Neurological: Alert and oriented to person, place, and time. Exhibits normal muscle tone. Gait normal. Coordination normal.  Skin: Skin is warm and dry. No  rash noted. Not diaphoretic. No erythema. No pallor.  Psychiatric: Mood, memory and judgment normal.  Vitals reviewed.  LABORATORY DATA: Lab Results  Component Value Date   WBC 4.5 06/02/2019   HGB 9.2 (L) 06/02/2019   HCT 30.9 (L) 06/02/2019   MCV 101.6 (H) 06/02/2019   PLT 304 06/02/2019      Chemistry      Component Value Date/Time   NA 139 06/02/2019 1235   NA 135 (L) 01/08/2017 1055   K 3.6 06/02/2019 1235   K 3.2 (L) 01/08/2017 1055   CL 106 06/02/2019 1235   CO2 24 06/02/2019 1235   CO2 25 01/08/2017 1055   BUN 7 06/02/2019 1235   BUN 14.6 01/08/2017 1055   CREATININE 0.80 06/02/2019 1235   CREATININE 1.3 (H) 01/08/2017 1055  Component Value Date/Time   CALCIUM 9.5 06/02/2019 1235   CALCIUM 9.5 01/08/2017 1055   ALKPHOS 91 06/02/2019 1235   ALKPHOS 82 01/08/2017 1055   AST 16 06/02/2019 1235   AST 31 01/08/2017 1055   ALT 11 06/02/2019 1235   ALT 34 01/08/2017 1055   BILITOT 0.2 (L) 06/02/2019 1235   BILITOT 1.74 (H) 01/08/2017 1055       RADIOGRAPHIC STUDIES:  DG Chest Port 1 View  Result Date: 05/10/2019 CLINICAL DATA:  Recent surgery with chest wall pain, initial encounter EXAM: PORTABLE CHEST 1 VIEW COMPARISON:  04/12/2019 CT FINDINGS: Left-sided Port-A-Cath has been removed in the interval. Cardiac shadow is within normal limits. Persistent left basilar volume loss is noted with small effusion. No pneumothorax is seen. Mild miliary pattern is noted and stable. No bony abnormality is noted. IMPRESSION: Stable changes in the left base. No pneumothorax following chest port removal. Stable miliary pattern bilaterally. Electronically Signed   By: Inez Catalina M.D.   On: 05/10/2019 15:41     ASSESSMENT/PLAN:  This is a very pleasant 54 year old African American female diagnosed with metastatic non-small cell lung cancer, adenocarcinoma. She presented with extensive miliary distribution in the lungs bilaterally diagnosed in September 2018. She is Positive  for EGFR mutation in exon 19. She was diagnosed in September 2018.   She is status post 13 months of treatment with Gilotrif 40 mg p.o. daily. This was discontinued due to evidence of disease progression.   She then was started on Tagrisso 80 mg p.o. daily. She is status post 12 months of treatment.   She was referred to Dr. Durenda Hurt at [facility] and repeat molecular studies was performed which showed BRAF mutation V600E.   Dr. Julien Nordmann recommended that she consider palliative systemic chemotherapy with carboplatin for AUC of 5, Alimta 500 mg/M2 and Avastin 15 mg/KG every 3 weeks. She is currently undergoing this and is status post 5 cycles in addition to 80 mg p.o. daily of Tagrisso.    Patient was seen with Dr. Julien Nordmann today.  Labs were reviewed.  Recommend that she proceed with cycle #6 today as scheduled.    I will arrange for a restaging CT scan prior to starting cycle #7. I will also arrange for her restaging brain MRI.   We will see her back for follow-up visit in 3 weeks for evaluation and to review her scans before starting cycle #7.  The patient was advised to call immediately if she has any concerning symptoms in the interval. The patient voices understanding of current disease status and treatment options and is in agreement with the current care plan. All questions were answered. The patient knows to call the clinic with any problems, questions or concerns. We can certainly see the patient much sooner if necessary  Orders Placed This Encounter  Procedures  . CT Chest W Contrast    Standing Status:   Future    Standing Expiration Date:   06/01/2020    Order Specific Question:   ** REASON FOR EXAM (FREE TEXT)    Answer:   Restaging Lung Cancer    Order Specific Question:   If indicated for the ordered procedure, I authorize the administration of contrast media per Radiology protocol    Answer:   Yes    Order Specific Question:   Is patient pregnant?    Answer:   No     Order Specific Question:   Preferred imaging location?    Answer:  [facility]    Order Specific Question:   Radiology Contrast Protocol - do NOT remove file path    Answer:   \\[email or web address]  . CT Abdomen Pelvis W Contrast    Standing Status:   Future    Standing Expiration Date:   06/01/2020    Order Specific Question:   ** REASON FOR EXAM (FREE TEXT)    Answer:   Restaging Lung Cancer    Order Specific Question:   If indicated for the ordered procedure, I authorize the administration of contrast media per Radiology protocol    Answer:   Yes    Order Specific Question:   Is patient pregnant?    Answer:   No    Order Specific Question:   Preferred imaging location?    Answer:   [facility]    Order Specific Question:   Is Oral Contrast requested for this exam?    Answer:   Yes, Per Radiology protocol    Order Specific Question:   Radiology Contrast Protocol - do NOT remove file path    Answer:   \\[email or web address].pdf  . MR Brain W Wo Contrast    Standing Status:   Future    Standing Expiration Date:   06/01/2020    Order Specific Question:   ** REASON FOR EXAM (FREE TEXT)    Answer:   Restaging Lung cancer with history of brain mets    Order Specific Question:   If indicated for the ordered procedure, I authorize the administration of contrast media per Radiology protocol    Answer:   Yes    Order Specific Question:   What is the patient's sedation requirement?    Answer:   No Sedation    Order Specific Question:   Does the patient have a pacemaker or implanted devices?    Answer:   No    Order Specific Question:   Use SRS Protocol?    Answer:   No    Order Specific Question:   Radiology Contrast Protocol - do NOT remove file path    Answer:   \\[email or web address]    Order Specific Question:   Preferred imaging location?    Answer:   [facility] (table limit - 550 lbs)  . Total  Protein, Urine dipstick     Jawan Chavarria L Adalie Mand, PA-C 06/02/19  ADDENDUM: Hematology/Oncology Attending: I had a face-to-face encounter with the patient today.  I recommended her care plan.  This is a very pleasant 54 years old African-American female with stage IV non-small cell lung cancer, adenocarcinoma with positive EGFR mutation with deletion in exon 19 initially treated with Afatinib for 13 months before developing disease progression and brain metastasis.  Her treatment was switched to osimertinib status post 12 months. The patient develop evidence for disease progression again and molecular studies showed positive BRAF mutation V600E in addition to the original EGFR mutation. The patient is currently undergoing systemic chemotherapy with carboplatin, Alimta and Avastin.  She also continues her current treatment with osimertinib and tolerating this regimen fairly well.  She is status post 5 cycles of treatment.  She has some issues with the Port-A-Cath that was removed. We will proceed with cycle #6 today as planned. We will see the patient back for follow-up visit in 3 weeks for evaluation with repeat CT scan of the chest, abdomen pelvis as well as MRI of the brain for restaging of her disease. The patient was advised to call immediately if she has  any concerning symptoms in the interval.  Disclaimer: This note was dictated with voice recognition software. Similar sounding words can inadvertently be transcribed and may be missed upon review. Eilleen Kempf, MD 06/02/19

## 2019-06-01 NOTE — Progress Notes (Signed)
PCP is Copland, Gay Filler, MD Referring Provider is Curt Bears, MD  Chief Complaint  Patient presents with  . Routine Post Op    port-a-cath removal 05/10/19    HPI: The patient returns for suture removal of an old left Port-A-Cath site.  After removal of the incision appears well-healed clean and dry without seroma or hematoma The patient is receiving Tagrisso and intermittent IV chemotherapy under the direction of Dr. Earlie Server for advanced adenocarcinoma of the lung   Past Medical History:  Diagnosis Date  . Adenocarcinoma of left lung, stage 4 ([place]) 10/28/2016  . Anemia   . Arthritis   . Constipation   . Crohn's disease ([place])    in remission 35 years +  . Dyspnea    gets "winded"  . GERD (gastroesophageal reflux disease)   . Goals of care, counseling/discussion 10/28/2016  . History of hiatal hernia    noted on CT 12/20  . Hypertension   . Pneumonia    walking pneumonia in the late 80's  . Recurrent pleural effusion on left     Past Surgical History:  Procedure Laterality Date  . ABDOMINAL HYSTERECTOMY     partial hysterectomy  . BREAST EXCISIONAL BIOPSY Left 20+ yrs ago   benign  . CHEST TUBE INSERTION Left 10/26/2018   Procedure: INSERTION PLEURAL DRAINAGE CATHETER;  Surgeon: Ivin Poot, MD;  Location: [facility];  Service: Thoracic;  Laterality: Left;  . COLONOSCOPY    . PERICARDIAL WINDOW N/A 01/10/2017   Procedure: PERICARDIAL WINDOW- SUB XYPHOID, RIGHT CHEST TUBE;  Surgeon: Ivin Poot, MD;  Location: [facility];  Service: Thoracic;  Laterality: N/A;  . PORT-A-CATH REMOVAL Left 05/10/2019   Procedure: REMOVAL PORT-A-CATH;  Surgeon: Ivin Poot, MD;  Location: [facility];  Service: Thoracic;  Laterality: Left;  . PORTACATH PLACEMENT Left 02/21/2019   Procedure: INSERTION PORT-A-CATH;  Surgeon: Ivin Poot, MD;  Location: [facility];  Service: Thoracic;  Laterality: Left;  . REMOVAL OF PLEURAL DRAINAGE CATHETER Left 02/21/2019   Procedure: REMOVAL OF PLEURAL  DRAINAGE CATHETER;  Surgeon: Ivin Poot, MD;  Location: [facility];  Service: Thoracic;  Laterality: Left;  Marland Kitchen VIDEO BRONCHOSCOPY Bilateral 10/21/2016   Procedure: VIDEO BRONCHOSCOPY WITH FLUORO;  Surgeon: Tanda Rockers, MD;  Location: [facility];  Service: Cardiopulmonary;  Laterality: Bilateral;    Family History  Problem Relation Age of Onset  . Asthma Mother   . Stroke Mother   . Hypertension Mother   . Heart attack Father   . Hypertension Father   . Hyperlipidemia Father   . Dementia Father   . Emphysema Maternal Grandmother   . Hypertension Maternal Grandmother   . Colon cancer Paternal Grandmother   . Brain cancer Maternal Uncle     Social History Social History   Tobacco Use  . Smoking status: Never Smoker  . Smokeless tobacco: Never Used  Substance Use Topics  . Alcohol use: Not Currently    Comment: socially  . Drug use: No    Current Outpatient Medications  Medication Sig Dispense Refill  . albuterol (VENTOLIN HFA) 108 (90 Base) MCG/ACT inhaler Inhale 2 puffs into the lungs every 6 (six) hours as needed for wheezing or shortness of breath. 8 g 1  . amoxicillin-clavulanate (AUGMENTIN) 875-125 MG tablet Take 1 tablet by mouth 2 (two) times daily. 14 tablet 0  . cephALEXin (KEFLEX) 500 MG capsule Take 1 capsule (500 mg total) by mouth 3 (three) times daily. 21 capsule 0  .  clindamycin (CLINDAGEL) 1 % gel Apply 1 application topically 2 (two) times daily. 30 g 1  . dexamethasone (DECADRON) 4 MG tablet 1 tablet p.o. twice daily the day before, day of and day after chemotherapy every 3 weeks (Patient taking differently: Take 4 mg by mouth See admin instructions. 4 mg  twice daily the day before, day of and day after chemotherapy every 3 weeks) 20 tablet 2  . fluticasone (FLONASE) 50 MCG/ACT nasal spray Place 2 sprays into both nostrils daily as needed for allergies. 16 g 3  . folic acid (FOLVITE) 1 MG tablet Take 1 tablet (1 mg total) by mouth daily. 30 tablet 4  .  furosemide (LASIX) 40 MG tablet TAKE 1 TABLET BY MOUTH ONCE DAILY (Patient taking differently: Take 40 mg by mouth daily. ) 90 tablet 1  . guaiFENesin (MUCINEX) 600 MG 12 hr tablet Take 600 mg by mouth 2 (two) times daily as needed for to loosen phlegm.     Marland Kitchen HYDROcodone-homatropine (HYCODAN) 5-1.5 MG/5ML syrup Take 5 mLs by mouth every 6 (six) hours as needed for cough. 240 mL 0  . lidocaine-prilocaine (EMLA) cream APPLY TO THE AFFECTED AREA(S) AS NEEDED (Patient taking differently: Apply 1 application topically as needed [facility]). ) 30 g 0  . linaclotide (LINZESS) 145 MCG CAPS capsule Take 1 capsule (145 mcg total) by mouth daily before breakfast. 30 capsule 6  . loperamide (IMODIUM) 1 MG/5ML solution Take 10 mLs (2 mg total) by mouth as needed for diarrhea or loose stools. (Patient taking differently: Take 2 mg by mouth daily as needed for diarrhea or loose stools. ) 120 mL 0  . magnesium oxide (MAG-OX) 400 (241.3 Mg) MG tablet Take 1 tablet (400 mg total) by mouth daily.    . methocarbamol (ROBAXIN) 500 MG tablet TAKE 1 TABLET (500 MG TOTAL) BY MOUTH EVERY 8 (EIGHT) HOURS AS NEEDED FOR MUSCLE SPASMS. 30 tablet 5  . nystatin cream (MYCOSTATIN) APPLY 1 APPLICATION TOPICALLY 2 (TWO) TIMES DAILY. (Patient taking differently: Apply 1 application topically daily as needed (skin irritaion). ) 30 g 0  . ondansetron (ZOFRAN) 8 MG tablet TAKE 1 TABLET BY MOUTH EVERY 8 HOURS AS NEEDED FOR NAUSEA OR VOMITING (Patient taking differently: Take 8 mg by mouth every 8 (eight) hours as needed for nausea or vomiting. ) 30 tablet 2  . osimertinib mesylate (TAGRISSO) 80 MG tablet Take 1 tablet (80 mg total) by mouth daily. 30 tablet 1  . Oxycodone HCl 10 MG TABS Take 1 tablet (10 mg total) by mouth 3 (three) times daily as needed. (Patient taking differently: Take 10 mg by mouth 2 (two) times daily as needed (Pain). ) 30 tablet 0  . pantoprazole (PROTONIX) 40 MG tablet TAKE 1 TABLET (40 MG TOTAL) BY MOUTH DAILY AT 12 NOON.  (Patient taking differently: Take 40 mg by mouth daily. ) 90 tablet 3  . potassium chloride SA (K-DUR) 20 MEQ tablet TAKE 1 TABLET (20 MEQ TOTAL) BY MOUTH 2 TIMES DAILY. (Patient taking differently: Take 20 mEq by mouth 2 (two) times daily. ) 180 tablet 3  . prochlorperazine (COMPAZINE) 10 MG tablet Take 1 tablet (10 mg total) by mouth every 6 (six) hours as needed for nausea or vomiting. 30 tablet 2  . senna-docusate (SENOKOT-S) 8.6-50 MG tablet Take 1 tablet by mouth at bedtime.    . traMADol (ULTRAM) 50 MG tablet Take 1 tablet (50 mg total) by mouth every 6 (six) hours as needed. 40 tablet 0  No current facility-administered medications for this visit.    Allergies  Allergen Reactions  . Percocet [Oxycodone-Acetaminophen] Other (See Comments)    "makes me dizzy" / takes TID at home  . Lisinopril Cough  . Losartan Cough    Review of Systems  Some weight loss associated with chemo No fever  BP 116/69 (BP Location: Right Arm, Patient Position: Sitting, Cuff Size: Normal)   Pulse (!) 106   Temp 97.7 F (36.5 C) (Temporal)   Resp 20   Ht 5' 6"  (1.676 m)   Wt 190 lb (86.2 kg)   LMP 03/15/2010   SpO2 95% Comment: RA  BMI 30.67 kg/m  Physical Exam Chronically ill-appearing well-kept female no acute distress Breath sounds clear Left anterior chest Port-A-Cath incision well-healed-Neosporin Band-Aid dressing applied  Diagnostic Tests: None  Impression: Sutures removed If additional Port-A-Cath needed would recommend IR placement under radiographic guidance  Plan: Return as needed   Len Childs, MD [facility] [phone]

## 2019-06-02 ENCOUNTER — Encounter: Payer: Self-pay | Admitting: Physician Assistant

## 2019-06-02 ENCOUNTER — Ambulatory Visit: Payer: 59 | Admitting: Internal Medicine

## 2019-06-02 ENCOUNTER — Inpatient Hospital Stay (HOSPITAL_BASED_OUTPATIENT_CLINIC_OR_DEPARTMENT_OTHER): Payer: Medicare Other | Admitting: Physician Assistant

## 2019-06-02 ENCOUNTER — Other Ambulatory Visit: Payer: 59

## 2019-06-02 ENCOUNTER — Telehealth: Payer: Self-pay

## 2019-06-02 ENCOUNTER — Inpatient Hospital Stay: Payer: Medicare Other | Attending: Oncology

## 2019-06-02 ENCOUNTER — Other Ambulatory Visit: Payer: Self-pay

## 2019-06-02 ENCOUNTER — Inpatient Hospital Stay: Payer: Medicare Other

## 2019-06-02 VITALS — BP 126/78 | HR 95 | Temp 98.3°F | Resp 18 | Ht 66.0 in | Wt 192.5 lb

## 2019-06-02 DIAGNOSIS — J90 Pleural effusion, not elsewhere classified: Secondary | ICD-10-CM | POA: Diagnosis not present

## 2019-06-02 DIAGNOSIS — Z5112 Encounter for antineoplastic immunotherapy: Secondary | ICD-10-CM | POA: Insufficient documentation

## 2019-06-02 DIAGNOSIS — C7931 Secondary malignant neoplasm of brain: Secondary | ICD-10-CM | POA: Insufficient documentation

## 2019-06-02 DIAGNOSIS — C3492 Malignant neoplasm of unspecified part of left bronchus or lung: Secondary | ICD-10-CM | POA: Diagnosis present

## 2019-06-02 DIAGNOSIS — I10 Essential (primary) hypertension: Secondary | ICD-10-CM | POA: Insufficient documentation

## 2019-06-02 DIAGNOSIS — Z5111 Encounter for antineoplastic chemotherapy: Secondary | ICD-10-CM | POA: Diagnosis present

## 2019-06-02 DIAGNOSIS — K5090 Crohn's disease, unspecified, without complications: Secondary | ICD-10-CM | POA: Insufficient documentation

## 2019-06-02 DIAGNOSIS — R197 Diarrhea, unspecified: Secondary | ICD-10-CM | POA: Diagnosis not present

## 2019-06-02 DIAGNOSIS — C3490 Malignant neoplasm of unspecified part of unspecified bronchus or lung: Secondary | ICD-10-CM

## 2019-06-02 DIAGNOSIS — R21 Rash and other nonspecific skin eruption: Secondary | ICD-10-CM | POA: Insufficient documentation

## 2019-06-02 LAB — CMP (CANCER CENTER ONLY)
ALT: 11 U/L (ref 0–44)
AST: 16 U/L (ref 15–41)
Albumin: 3.5 g/dL (ref 3.5–5.0)
Alkaline Phosphatase: 91 U/L (ref 38–126)
Anion gap: 9 (ref 5–15)
BUN: 7 mg/dL (ref 6–20)
CO2: 24 mmol/L (ref 22–32)
Calcium: 9.5 mg/dL (ref 8.9–10.3)
Chloride: 106 mmol/L (ref 98–111)
Creatinine: 0.8 mg/dL (ref 0.44–1.00)
GFR, Est AFR Am: >60 mL/min (ref >60)
GFR, Estimated: >60 mL/min (ref >60)
Glucose, Bld: 122 mg/dL — ABNORMAL HIGH (ref 70–99)
Potassium: 3.6 mmol/L (ref 3.5–5.1)
Sodium: 139 mmol/L (ref 135–145)
Total Bilirubin: 0.2 mg/dL — ABNORMAL LOW (ref 0.3–1.2)
Total Protein: 7 g/dL (ref 6.5–8.1)

## 2019-06-02 LAB — CBC WITH DIFFERENTIAL (CANCER CENTER ONLY)
Abs Immature Granulocytes: 0.01 10*3/uL (ref 0.00–0.07)
Basophils Absolute: 0 10*3/uL (ref 0.0–0.1)
Basophils Relative: 0 %
Eosinophils Absolute: 0 10*3/uL (ref 0.0–0.5)
Eosinophils Relative: 0 %
HCT: 30.9 % — ABNORMAL LOW (ref 36.0–46.0)
Hemoglobin: 9.2 g/dL — ABNORMAL LOW (ref 12.0–15.0)
Immature Granulocytes: 0 %
Lymphocytes Relative: 21 %
Lymphs Abs: 1 10*3/uL (ref 0.7–4.0)
MCH: 30.3 pg (ref 26.0–34.0)
MCHC: 29.8 g/dL — ABNORMAL LOW (ref 30.0–36.0)
MCV: 101.6 fL — ABNORMAL HIGH (ref 80.0–100.0)
Monocytes Absolute: 0.9 10*3/uL (ref 0.1–1.0)
Monocytes Relative: 20 %
Neutro Abs: 2.6 10*3/uL (ref 1.7–7.7)
Neutrophils Relative %: 59 %
Platelet Count: 304 10*3/uL (ref 150–400)
RBC: 3.04 MIL/uL — ABNORMAL LOW (ref 3.87–5.11)
RDW: 21.8 % — ABNORMAL HIGH (ref 11.5–15.5)
WBC Count: 4.5 10*3/uL (ref 4.0–10.5)
nRBC: 0 % (ref 0.0–0.2)

## 2019-06-02 LAB — TOTAL PROTEIN, URINE DIPSTICK: Protein, ur: NEGATIVE mg/dL

## 2019-06-02 MED ORDER — PALONOSETRON HCL INJECTION 0.25 MG/5ML
INTRAVENOUS | Status: AC
  Filled 2019-06-02: qty 5

## 2019-06-02 MED ORDER — PALONOSETRON HCL INJECTION 0.25 MG/5ML
0.2500 mg | Freq: Once | INTRAVENOUS | Status: AC
  Administered 2019-06-02: 0.25 mg via INTRAVENOUS

## 2019-06-02 MED ORDER — SODIUM CHLORIDE 0.9 % IV SOLN
600.0000 mg | Freq: Once | INTRAVENOUS | Status: AC
  Administered 2019-06-02: 600 mg via INTRAVENOUS
  Filled 2019-06-02: qty 60

## 2019-06-02 MED ORDER — SODIUM CHLORIDE 0.9 % IV SOLN
10.0000 mg | Freq: Once | INTRAVENOUS | Status: AC
  Administered 2019-06-02: 10 mg via INTRAVENOUS
  Filled 2019-06-02: qty 10

## 2019-06-02 MED ORDER — SODIUM CHLORIDE 0.9 % IV SOLN
Freq: Once | INTRAVENOUS | Status: AC
  Filled 2019-06-02: qty 250

## 2019-06-02 MED ORDER — SODIUM CHLORIDE 0.9 % IV SOLN
15.0000 mg/kg | Freq: Once | INTRAVENOUS | Status: AC
  Administered 2019-06-02: 1300 mg via INTRAVENOUS
  Filled 2019-06-02: qty 48

## 2019-06-02 MED ORDER — SODIUM CHLORIDE 0.9 % IV SOLN
150.0000 mg | Freq: Once | INTRAVENOUS | Status: AC
  Administered 2019-06-02: 150 mg via INTRAVENOUS
  Filled 2019-06-02: qty 150

## 2019-06-02 MED ORDER — CYANOCOBALAMIN 1000 MCG/ML IJ SOLN
1000.0000 ug | Freq: Once | INTRAMUSCULAR | Status: AC
  Administered 2019-06-02: 1000 ug via INTRAMUSCULAR

## 2019-06-02 MED ORDER — SODIUM CHLORIDE 0.9 % IV SOLN
800.0000 mg | Freq: Once | INTRAVENOUS | Status: AC
  Administered 2019-06-02: 800 mg via INTRAVENOUS
  Filled 2019-06-02: qty 12

## 2019-06-02 MED ORDER — CYANOCOBALAMIN 1000 MCG/ML IJ SOLN
INTRAMUSCULAR | Status: AC
  Filled 2019-06-02: qty 1

## 2019-06-02 MED ORDER — SODIUM CHLORIDE 0.9 % IV SOLN: 15.0000 mg/kg | Freq: Once | INTRAVENOUS | Status: DC

## 2019-06-02 NOTE — Telephone Encounter (Signed)
Oral Oncology Patient Advocate Encounter  Was successful in securing patient a $6000 grant from [employer] to provide copayment coverage for [place].  This will keep the out of pocket expense at $0.     [employer] ID: [id number]  I have spoken with the patient.   The billing information is as follows and has been shared with [facility].    RxBin: [id number] PCN: [id number] Member ID: [id number] Group ID: [id number] Dates of Eligibility: 05/03/19 through 05/02/19  Fund:  [place] Patient [place] [facility] Phone [phone] Fax [phone] 06/02/2019 11:14 AM

## 2019-06-02 NOTE — Patient Instructions (Signed)
[facility] Discharge Instructions for Patients Receiving Chemotherapy  Today you received the following chemotherapy agents: Bevacizumab-awwb (Mvasi), Pemetrexed (Alimta), and Carboplatin (Paraplatin)  To help prevent nausea and vomiting after your treatment, we encourage you to take your nausea medication as prescribed.   If you develop nausea and vomiting that is not controlled by your nausea medication, call the clinic.   BELOW ARE SYMPTOMS THAT SHOULD BE REPORTED IMMEDIATELY:  *FEVER GREATER THAN 100.5 F  *CHILLS WITH OR WITHOUT FEVER  NAUSEA AND VOMITING THAT IS NOT CONTROLLED WITH YOUR NAUSEA MEDICATION  *UNUSUAL SHORTNESS OF BREATH  *UNUSUAL BRUISING OR BLEEDING  TENDERNESS IN MOUTH AND THROAT WITH OR WITHOUT PRESENCE OF ULCERS  *URINARY PROBLEMS  *BOWEL PROBLEMS  UNUSUAL RASH Items with * indicate a potential emergency and should be followed up as soon as possible.  Feel free to call the clinic should you have any questions or concerns. The clinic phone number is [phone].  Please show the [place] at check-in to the Emergency Department and triage nurse.

## 2019-06-02 NOTE — Progress Notes (Addendum)
Bevacizumab dose of 15 mg/kg is being adjusted today based on patients most current weight of 87.3 kg (previous dose 1500 mg, new dose based on current weight is 1300 mg). Treatment plan orders updated. OK per Cassandra Heilingoetter, PA-C.    Leron Croak, PharmD, BCPS PGY2 Hematology/Oncology Pharmacy Resident 06/02/2019 2:51 PM

## 2019-06-09 ENCOUNTER — Other Ambulatory Visit: Payer: Self-pay

## 2019-06-09 ENCOUNTER — Inpatient Hospital Stay: Payer: Medicare Other

## 2019-06-09 ENCOUNTER — Other Ambulatory Visit: Payer: 59

## 2019-06-09 ENCOUNTER — Telehealth: Payer: Self-pay | Admitting: Physician Assistant

## 2019-06-09 DIAGNOSIS — C3492 Malignant neoplasm of unspecified part of left bronchus or lung: Secondary | ICD-10-CM

## 2019-06-09 DIAGNOSIS — Z5112 Encounter for antineoplastic immunotherapy: Secondary | ICD-10-CM | POA: Diagnosis not present

## 2019-06-09 LAB — CBC WITH DIFFERENTIAL (CANCER CENTER ONLY)
Abs Immature Granulocytes: 0.02 10*3/uL (ref 0.00–0.07)
Basophils Absolute: 0 10*3/uL (ref 0.0–0.1)
Basophils Relative: 0 %
Eosinophils Absolute: 0 10*3/uL (ref 0.0–0.5)
Eosinophils Relative: 1 %
HCT: 30.7 % — ABNORMAL LOW (ref 36.0–46.0)
Hemoglobin: 9.2 g/dL — ABNORMAL LOW (ref 12.0–15.0)
Immature Granulocytes: 1 %
Lymphocytes Relative: 32 %
Lymphs Abs: 1.3 10*3/uL (ref 0.7–4.0)
MCH: 30.6 pg (ref 26.0–34.0)
MCHC: 30 g/dL (ref 30.0–36.0)
MCV: 102 fL — ABNORMAL HIGH (ref 80.0–100.0)
Monocytes Absolute: 0.4 10*3/uL (ref 0.1–1.0)
Monocytes Relative: 10 %
Neutro Abs: 2.3 10*3/uL (ref 1.7–7.7)
Neutrophils Relative %: 56 %
Platelet Count: 217 10*3/uL (ref 150–400)
RBC: 3.01 MIL/uL — ABNORMAL LOW (ref 3.87–5.11)
RDW: 19.4 % — ABNORMAL HIGH (ref 11.5–15.5)
WBC Count: 4.1 10*3/uL (ref 4.0–10.5)
nRBC: 0 % (ref 0.0–0.2)

## 2019-06-09 LAB — CMP (CANCER CENTER ONLY)
ALT: 18 U/L (ref 0–44)
AST: 25 U/L (ref 15–41)
Albumin: 3.6 g/dL (ref 3.5–5.0)
Alkaline Phosphatase: 80 U/L (ref 38–126)
Anion gap: 11 (ref 5–15)
BUN: 10 mg/dL (ref 6–20)
CO2: 25 mmol/L (ref 22–32)
Calcium: 9.4 mg/dL (ref 8.9–10.3)
Chloride: 102 mmol/L (ref 98–111)
Creatinine: 0.85 mg/dL (ref 0.44–1.00)
GFR, Est AFR Am: >60 mL/min (ref >60)
GFR, Estimated: >60 mL/min (ref >60)
Glucose, Bld: 111 mg/dL — ABNORMAL HIGH (ref 70–99)
Potassium: 4.1 mmol/L (ref 3.5–5.1)
Sodium: 138 mmol/L (ref 135–145)
Total Bilirubin: 0.5 mg/dL (ref 0.3–1.2)
Total Protein: 6.9 g/dL (ref 6.5–8.1)

## 2019-06-09 LAB — TOTAL PROTEIN, URINE DIPSTICK: Protein, ur: 100 mg/dL — AB

## 2019-06-09 NOTE — Telephone Encounter (Signed)
Scheduled per los. Called and left msg. Mailed printout  °

## 2019-06-13 MED FILL — DEXAMETHASONE 4 MG TABLET: 4 | 21 days supply | Qty: 6 | Fill #5

## 2019-06-13 MED FILL — PANTOPRAZOLE SOD DR 40 MG T: 40 | 30 days supply | Qty: 30 | Fill #8

## 2019-06-13 MED FILL — FOLIC ACID 1 MG TABS: 1 | 30 days supply | Qty: 30 | Fill #4

## 2019-06-16 ENCOUNTER — Other Ambulatory Visit: Payer: 59

## 2019-06-16 ENCOUNTER — Inpatient Hospital Stay: Payer: Medicare Other

## 2019-06-16 ENCOUNTER — Other Ambulatory Visit: Payer: Self-pay

## 2019-06-16 ENCOUNTER — Ambulatory Visit: Payer: 59

## 2019-06-16 ENCOUNTER — Ambulatory Visit: Payer: 59 | Admitting: Internal Medicine

## 2019-06-16 DIAGNOSIS — C3492 Malignant neoplasm of unspecified part of left bronchus or lung: Secondary | ICD-10-CM

## 2019-06-16 DIAGNOSIS — Z5112 Encounter for antineoplastic immunotherapy: Secondary | ICD-10-CM | POA: Diagnosis not present

## 2019-06-16 LAB — CMP (CANCER CENTER ONLY)
ALT: 28 U/L (ref 0–44)
AST: 29 U/L (ref 15–41)
Albumin: 3.6 g/dL (ref 3.5–5.0)
Alkaline Phosphatase: 77 U/L (ref 38–126)
Anion gap: 11 (ref 5–15)
BUN: 9 mg/dL (ref 6–20)
CO2: 27 mmol/L (ref 22–32)
Calcium: 9.4 mg/dL (ref 8.9–10.3)
Chloride: 105 mmol/L (ref 98–111)
Creatinine: 0.81 mg/dL (ref 0.44–1.00)
GFR, Est AFR Am: >60 mL/min (ref >60)
GFR, Estimated: >60 mL/min (ref >60)
Glucose, Bld: 87 mg/dL (ref 70–99)
Potassium: 3.7 mmol/L (ref 3.5–5.1)
Sodium: 143 mmol/L (ref 135–145)
Total Bilirubin: <0.2 mg/dL — ABNORMAL LOW (ref 0.3–1.2)
Total Protein: 6.8 g/dL (ref 6.5–8.1)

## 2019-06-16 LAB — CBC WITH DIFFERENTIAL (CANCER CENTER ONLY)
Abs Immature Granulocytes: 0.01 10*3/uL (ref 0.00–0.07)
Basophils Absolute: 0 10*3/uL (ref 0.0–0.1)
Basophils Relative: 0 %
Eosinophils Absolute: 0 10*3/uL (ref 0.0–0.5)
Eosinophils Relative: 1 %
HCT: 27.7 % — ABNORMAL LOW (ref 36.0–46.0)
Hemoglobin: 8.4 g/dL — ABNORMAL LOW (ref 12.0–15.0)
Immature Granulocytes: 0 %
Lymphocytes Relative: 27 %
Lymphs Abs: 1.2 10*3/uL (ref 0.7–4.0)
MCH: 31.5 pg (ref 26.0–34.0)
MCHC: 30.3 g/dL (ref 30.0–36.0)
MCV: 103.7 fL — ABNORMAL HIGH (ref 80.0–100.0)
Monocytes Absolute: 0.7 10*3/uL (ref 0.1–1.0)
Monocytes Relative: 16 %
Neutro Abs: 2.6 10*3/uL (ref 1.7–7.7)
Neutrophils Relative %: 56 %
Platelet Count: 56 10*3/uL — ABNORMAL LOW (ref 150–400)
RBC: 2.67 MIL/uL — ABNORMAL LOW (ref 3.87–5.11)
RDW: 19.3 % — ABNORMAL HIGH (ref 11.5–15.5)
WBC Count: 4.6 10*3/uL (ref 4.0–10.5)
nRBC: 0 % (ref 0.0–0.2)

## 2019-06-21 ENCOUNTER — Ambulatory Visit (HOSPITAL_COMMUNITY)
Admission: RE | Admit: 2019-06-21 | Discharge: 2019-06-21 | Disposition: A | Discharge status: Home / Self Care | Payer: Medicare Other | Source: Ambulatory Visit | Attending: Physician Assistant | Admitting: Physician Assistant

## 2019-06-21 ENCOUNTER — Other Ambulatory Visit: Payer: Self-pay

## 2019-06-21 DIAGNOSIS — C3492 Malignant neoplasm of unspecified part of left bronchus or lung: Secondary | ICD-10-CM | POA: Insufficient documentation

## 2019-06-21 MED ORDER — SODIUM CHLORIDE (PF) 0.9 % IJ SOLN
INTRAMUSCULAR | Status: AC
  Filled 2019-06-21: qty 50

## 2019-06-21 MED ORDER — IOHEXOL 300 MG/ML  SOLN
100.0000 mL | Freq: Once | INTRAMUSCULAR | Status: AC | PRN
  Administered 2019-06-21: 100 mL via INTRAVENOUS

## 2019-06-22 ENCOUNTER — Ambulatory Visit (HOSPITAL_COMMUNITY)
Admission: RE | Admit: 2019-06-22 | Discharge: 2019-06-22 | Disposition: A | Discharge status: Home / Self Care | Payer: Medicare Other | Source: Ambulatory Visit | Attending: Physician Assistant | Admitting: Physician Assistant

## 2019-06-22 DIAGNOSIS — C3492 Malignant neoplasm of unspecified part of left bronchus or lung: Secondary | ICD-10-CM | POA: Diagnosis not present

## 2019-06-22 MED ORDER — GADOBUTROL 1 MMOL/ML IV SOLN
8.0000 mL | Freq: Once | INTRAVENOUS | Status: AC | PRN
  Administered 2019-06-22: 8 mL via INTRAVENOUS

## 2019-06-23 ENCOUNTER — Inpatient Hospital Stay (HOSPITAL_BASED_OUTPATIENT_CLINIC_OR_DEPARTMENT_OTHER): Payer: Medicare Other | Admitting: Internal Medicine

## 2019-06-23 ENCOUNTER — Other Ambulatory Visit: Payer: Self-pay

## 2019-06-23 ENCOUNTER — Encounter: Payer: Self-pay | Admitting: Internal Medicine

## 2019-06-23 ENCOUNTER — Inpatient Hospital Stay: Payer: Medicare Other

## 2019-06-23 ENCOUNTER — Encounter: Payer: Self-pay | Admitting: Dietician

## 2019-06-23 VITALS — BP 135/80 | HR 96 | Temp 97.7°F | Resp 18 | Ht 66.0 in | Wt 194.9 lb

## 2019-06-23 VITALS — BP 120/76 | HR 94

## 2019-06-23 DIAGNOSIS — C3492 Malignant neoplasm of unspecified part of left bronchus or lung: Secondary | ICD-10-CM

## 2019-06-23 DIAGNOSIS — I10 Essential (primary) hypertension: Secondary | ICD-10-CM | POA: Diagnosis not present

## 2019-06-23 DIAGNOSIS — C3490 Malignant neoplasm of unspecified part of unspecified bronchus or lung: Secondary | ICD-10-CM

## 2019-06-23 DIAGNOSIS — Z5111 Encounter for antineoplastic chemotherapy: Secondary | ICD-10-CM

## 2019-06-23 DIAGNOSIS — C7931 Secondary malignant neoplasm of brain: Secondary | ICD-10-CM

## 2019-06-23 DIAGNOSIS — Z5112 Encounter for antineoplastic immunotherapy: Secondary | ICD-10-CM | POA: Diagnosis not present

## 2019-06-23 LAB — CMP (CANCER CENTER ONLY)
ALT: 18 U/L (ref 0–44)
AST: 27 U/L (ref 15–41)
Albumin: 4.2 g/dL (ref 3.5–5.0)
Alkaline Phosphatase: 75 U/L (ref 38–126)
Anion gap: 11 (ref 5–15)
BUN: 12 mg/dL (ref 6–20)
CO2: 26 mmol/L (ref 22–32)
Calcium: 9.6 mg/dL (ref 8.9–10.3)
Chloride: 104 mmol/L (ref 98–111)
Creatinine: 0.91 mg/dL (ref 0.44–1.00)
GFR, Est AFR Am: >60 mL/min (ref >60)
GFR, Estimated: >60 mL/min (ref >60)
Glucose, Bld: 121 mg/dL — ABNORMAL HIGH (ref 70–99)
Potassium: 3.7 mmol/L (ref 3.5–5.1)
Sodium: 141 mmol/L (ref 135–145)
Total Bilirubin: 0.3 mg/dL (ref 0.3–1.2)
Total Protein: 7.4 g/dL (ref 6.5–8.1)

## 2019-06-23 LAB — CBC WITH DIFFERENTIAL (CANCER CENTER ONLY)
Abs Immature Granulocytes: 0.02 10*3/uL (ref 0.00–0.07)
Basophils Absolute: 0 10*3/uL (ref 0.0–0.1)
Basophils Relative: 0 %
Eosinophils Absolute: 0 10*3/uL (ref 0.0–0.5)
Eosinophils Relative: 0 %
HCT: 32.1 % — ABNORMAL LOW (ref 36.0–46.0)
Hemoglobin: 9.6 g/dL — ABNORMAL LOW (ref 12.0–15.0)
Immature Granulocytes: 0 %
Lymphocytes Relative: 16 %
Lymphs Abs: 0.9 10*3/uL (ref 0.7–4.0)
MCH: 31.8 pg (ref 26.0–34.0)
MCHC: 29.9 g/dL — ABNORMAL LOW (ref 30.0–36.0)
MCV: 106.3 fL — ABNORMAL HIGH (ref 80.0–100.0)
Monocytes Absolute: 0.9 10*3/uL (ref 0.1–1.0)
Monocytes Relative: 15 %
Neutro Abs: 4 10*3/uL (ref 1.7–7.7)
Neutrophils Relative %: 69 %
Platelet Count: 186 10*3/uL (ref 150–400)
RBC: 3.02 MIL/uL — ABNORMAL LOW (ref 3.87–5.11)
RDW: 21.3 % — ABNORMAL HIGH (ref 11.5–15.5)
WBC Count: 5.8 10*3/uL (ref 4.0–10.5)
nRBC: 0 % (ref 0.0–0.2)

## 2019-06-23 LAB — TOTAL PROTEIN, URINE DIPSTICK: Protein, ur: 30 mg/dL — AB

## 2019-06-23 MED ORDER — PROCHLORPERAZINE MALEATE 10 MG PO TABS
ORAL_TABLET | ORAL | Status: AC
  Filled 2019-06-23: qty 1

## 2019-06-23 MED ORDER — PROCHLORPERAZINE MALEATE 10 MG PO TABS
10.0000 mg | ORAL_TABLET | Freq: Once | ORAL | Status: AC
  Administered 2019-06-23: 10 mg via ORAL

## 2019-06-23 MED ORDER — PALONOSETRON HCL INJECTION 0.25 MG/5ML: 0.2500 mg | Freq: Once | INTRAVENOUS | Status: DC

## 2019-06-23 MED ORDER — SODIUM CHLORIDE 0.9 % IV SOLN
15.0000 mg/kg | Freq: Once | INTRAVENOUS | Status: AC
  Administered 2019-06-23: 1300 mg via INTRAVENOUS
  Filled 2019-06-23: qty 48

## 2019-06-23 MED ORDER — PALONOSETRON HCL INJECTION 0.25 MG/5ML
INTRAVENOUS | Status: AC
  Filled 2019-06-23: qty 5

## 2019-06-23 MED ORDER — SODIUM CHLORIDE 0.9 % IV SOLN
10.0000 mg | Freq: Once | INTRAVENOUS | Status: AC
  Administered 2019-06-23: 10 mg via INTRAVENOUS
  Filled 2019-06-23: qty 10

## 2019-06-23 MED ORDER — SODIUM CHLORIDE 0.9 % IV SOLN
Freq: Once | INTRAVENOUS | Status: AC
  Filled 2019-06-23: qty 250

## 2019-06-23 MED ORDER — SODIUM CHLORIDE 0.9 % IV SOLN
150.0000 mg | Freq: Once | INTRAVENOUS | Status: DC
  Filled 2019-06-23: qty 5

## 2019-06-23 MED ORDER — SODIUM CHLORIDE 0.9 % IV SOLN
400.0000 mg/m2 | Freq: Once | INTRAVENOUS | Status: AC
  Administered 2019-06-23: 800 mg via INTRAVENOUS
  Filled 2019-06-23: qty 12

## 2019-06-23 NOTE — Patient Instructions (Signed)
[facility] Discharge Instructions for Patients Receiving Chemotherapy  Today you received the following chemotherapy agents: Bevacizumab-awwb (Mvasi), Pemetrexed (Alimta). To help prevent nausea and vomiting after your treatment, we encourage you to take your nausea medication as prescribed.   If you develop nausea and vomiting that is not controlled by your nausea medication, call the clinic.   BELOW ARE SYMPTOMS THAT SHOULD BE REPORTED IMMEDIATELY:  *FEVER GREATER THAN 100.5 F  *CHILLS WITH OR WITHOUT FEVER  NAUSEA AND VOMITING THAT IS NOT CONTROLLED WITH YOUR NAUSEA MEDICATION  *UNUSUAL SHORTNESS OF BREATH  *UNUSUAL BRUISING OR BLEEDING  TENDERNESS IN MOUTH AND THROAT WITH OR WITHOUT PRESENCE OF ULCERS  *URINARY PROBLEMS  *BOWEL PROBLEMS  UNUSUAL RASH Items with * indicate a potential emergency and should be followed up as soon as possible.  Feel free to call the clinic should you have any questions or concerns. The clinic phone number is [phone].  Please show the [place] at check-in to the Emergency Department and triage nurse.

## 2019-06-23 NOTE — Progress Notes (Unsigned)
Nutrition Assessment   Reason for Assessment: Patient request   ASSESSMENT: 54 year old female with stage IV non-small lung cancer metastatic to brain and is followed by Dr. Julien Nordmann Treatment: Carboplatin/Alimta/Targrisso  PMH: GERD, HTN, recurrent left pleural effusion, history of Crohn's disease (in remission 35+ yr)  Met with patient during infusion, per pt request as RD was walking by. Patient reports decreased appetite/intake, drinking 2 Boost and 3 bottles of water daily. Patient requesting coupons for Boost. Patient reports usual intake of 2 meals and 2 supplements daily. She reports having nausea for 5 days after treatments, symptoms well managed with compazine. Patient reports taking Linzess and having regular bowel movements. Weights have fluctuated, pt recalls usual weight around 225 lbs. Current weight 194 lb 14.4 oz increased from 192 lb 8 oz on 5/5, decreased from 197 lb 8 oz on 4/15, increased from 191 lb 14.4 oz on 4/8. Patient with history of recurrent pleural effusion on Lasix.   Nutrition Focused Physical Exam: Deferred  Medications: Folic acid, decadron, lasix, linzess, mag-ox, zofran, protonix, k-dur, compazine, senokot  Labs: BG 121, Hgb 9.6  Anthropometrics:   Height: 5'6" Weight: 194 lb 14.4 oz (88.4 kg) UBW: 225 lb BMI: 31.46 kg/m2  Estimated Energy Needs Kcals: 2210-2475 Protein: 115-124 Fluid: 2.4 L  NUTRITION DIAGNOSIS: Unintended weight loss related to cancer and related treatments as evidenced by significant 13.8% weight loss in 6 months.  INTERVENTION:  Educated on small frequent meals throughout the day, high in calories and protein, handout provided Encouraged continuing 2 Boost supplements daily, coupons provided Discussed foods better tolerated when feeling nauseas, handout provided Contact information given  MONITORING, EVALUATION, GOAL: Patient will tolerate increased calories and protein to minimize weight loss  Next Visit: To be  scheduled with infusion as needed  Lajuan Lines, RD, LDN Clinical Nutrition After Hours/Weekend Pager # in [place]

## 2019-06-23 NOTE — Progress Notes (Signed)
[place] Telephone:([phone]) [phone]   Fax:([phone]) [phone]  OFFICE PROGRESS NOTE  Copland, Gay Filler, MD [address] [place] [address]  DIAGNOSIS: DIAGNOSIS: stage IV (T3, N2, M1 a) non-small cell lung cancer, poorly differentiated adenocarcinoma with extensive miliary distribution in the lungs bilaterally diagnosed in September 2018. 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 NEGATIVE for the Exon 20 T790M mutation. Repeat molecular studies by guardant 360 at time of progression showed ATM [id number] [facility] B[facility] mutation, V600E  PRIOR THERAPY:  1) Gilotrif 40 mg daily started 11/08/2016.Status post 13 months of treatment. 2) Tagrisso 80 mg p.o. daily.  First dose started January 01, 2018.  Status post 18 months of treatment.   CURRENT THERAPY: 1) systemic chemotherapy with carboplatin for AUC 5, Alimta 500 mg/M2 and Avastin 15 mg/KG every 3 weeks.  First dose February 10, 2018.  Status post 6 cycles.  Starting from cycle #7 she will be on maintenance treatment with Alimta and Avastin in addition to her treatment with Tagrisso.  INTERVAL HISTORY: [name] 54y.o. female returns to the clinic today for follow-up visit.  The patient is feeling fine today with no concerning complaints.  The patient denied having any current chest pain, shortness of breath, cough or hemoptysis.  The patient has no nausea, vomiting, diarrhea or constipation.  She denied having any recent weight loss or night sweats.  She has no headache or visual changes.  The site of the previous Port-A-Cath has completely healed.  She continues to tolerate her systemic chemotherapy with carboplatin, Alimta and Avastin fairly well.  She had repeat CT scan of the chest, abdomen pelvis as well as MRI of the brain performed recently and she is here for evaluation and discussion of her risk her  results.  MEDICAL HISTORY: Past Medical History:  Diagnosis Date  . Adenocarcinoma of left lung, stage 4 [place]  . Anemia   . Arthritis   . Constipation   . Crohn's disease ([place]    in remission 35 years +  . Dyspnea    gets "winded"  . GERD (gastroesophageal reflux disease)   . Goals of care, counseling/discussion 10/28/2016  . History of hiatal hernia    noted on CT 12/20  . Hypertension   . Pneumonia    walking pneumonia in the late 80's  . Recurrent pleural effusion on left     ALLERGIES:  is allergic to percocet [oxycodone-acetaminophen]; lisinopril; and losartan.  MEDICATIONS:  Current Outpatient Medications  Medication Sig Dispense Refill  . albuterol (VENTOLIN HFA) 108 (90 Base) MCG/ACT inhaler Inhale 2 puffs into the lungs every 6 (six) hours as needed for wheezing or shortness of breath. 8 g 1  . clindamycin (CLINDAGEL) 1 % gel Apply 1 application topically 2 (two) times daily. 30 g 1  . dexamethasone (DECADRON) 4 MG tablet 1 tablet p.o. twice daily the day before, day of and day after chemotherapy every 3 weeks (Patient taking differently: Take 4 mg by mouth See admin instructions. 4 mg  twice daily the day before, day of and day after chemotherapy every 3 weeks) 20 tablet 2  . fluticasone (FLONASE) 50 MCG/ACT nasal spray Place 2 sprays into both nostrils daily as needed for allergies. 16 g 3  . folic acid (FOLVITE) 1 MG tablet Take 1 tablet (1 mg total) by mouth daily. 30 tablet  4  . furosemide (LASIX) 40 MG tablet TAKE 1 TABLET BY MOUTH ONCE DAILY (Patient taking differently: Take 40 mg by mouth daily. ) 90 tablet 1  . guaiFENesin (MUCINEX) 600 MG 12 hr tablet Take 600 mg by mouth 2 (two) times daily as needed for to loosen phlegm.     Marland Kitchen HYDROcodone-homatropine (HYCODAN) 5-1.5 MG/5ML syrup Take 5 mLs by mouth every 6 (six) hours as needed for cough. 240 mL 0  . lidocaine-prilocaine (EMLA) cream APPLY TO THE AFFECTED AREA(S) AS NEEDED (Patient taking  differently: Apply 1 application topically as needed [facility]). ) 30 g 0  . linaclotide (LINZESS) 145 MCG CAPS capsule Take 1 capsule (145 mcg total) by mouth daily before breakfast. 30 capsule 6  . loperamide (IMODIUM) 1 MG/5ML solution Take 10 mLs (2 mg total) by mouth as needed for diarrhea or loose stools. (Patient taking differently: Take 2 mg by mouth daily as needed for diarrhea or loose stools. ) 120 mL 0  . magnesium oxide (MAG-OX) 400 (241.3 Mg) MG tablet Take 1 tablet (400 mg total) by mouth daily.    . methocarbamol (ROBAXIN) 500 MG tablet TAKE 1 TABLET (500 MG TOTAL) BY MOUTH EVERY 8 (EIGHT) HOURS AS NEEDED FOR MUSCLE SPASMS. 30 tablet 5  . nystatin cream (MYCOSTATIN) APPLY 1 APPLICATION TOPICALLY 2 (TWO) TIMES DAILY. (Patient taking differently: Apply 1 application topically daily as needed (skin irritaion). ) 30 g 0  . ondansetron (ZOFRAN) 8 MG tablet TAKE 1 TABLET BY MOUTH EVERY 8 HOURS AS NEEDED FOR NAUSEA OR VOMITING (Patient taking differently: Take 8 mg by mouth every 8 (eight) hours as needed for nausea or vomiting. ) 30 tablet 2  . osimertinib mesylate (TAGRISSO) 80 MG tablet Take 1 tablet (80 mg total) by mouth daily. 30 tablet 1  . Oxycodone HCl 10 MG TABS Take 1 tablet (10 mg total) by mouth 3 (three) times daily as needed. (Patient taking differently: Take 10 mg by mouth 2 (two) times daily as needed (Pain). ) 30 tablet 0  . pantoprazole (PROTONIX) 40 MG tablet TAKE 1 TABLET (40 MG TOTAL) BY MOUTH DAILY AT 12 NOON. (Patient taking differently: Take 40 mg by mouth daily. ) 90 tablet 3  . potassium chloride SA (K-DUR) 20 MEQ tablet TAKE 1 TABLET (20 MEQ TOTAL) BY MOUTH 2 TIMES DAILY. (Patient taking differently: Take 20 mEq by mouth 2 (two) times daily. ) 180 tablet 3  . prochlorperazine (COMPAZINE) 10 MG tablet Take 1 tablet (10 mg total) by mouth every 6 (six) hours as needed for nausea or vomiting. 30 tablet 2  . senna-docusate (SENOKOT-S) 8.6-50 MG tablet Take 1 tablet by mouth  at bedtime.    . traMADol (ULTRAM) 50 MG tablet Take 1 tablet (50 mg total) by mouth every 6 (six) hours as needed. 40 tablet 0   No current facility-administered medications for this visit.    SURGICAL HISTORY:  Past Surgical History:  Procedure Laterality Date  . ABDOMINAL HYSTERECTOMY     partial hysterectomy  . BREAST EXCISIONAL BIOPSY Left 20+ yrs ago   benign  . CHEST TUBE INSERTION Left 10/26/2018   Procedure: INSERTION PLEURAL DRAINAGE CATHETER;  Surgeon: Ivin Poot, MD;  Location: [facility];  Service: Thoracic;  Laterality: Left;  . COLONOSCOPY    . PERICARDIAL WINDOW N/A 01/10/2017   Procedure: PERICARDIAL WINDOW- SUB XYPHOID, RIGHT CHEST TUBE;  Surgeon: Ivin Poot, MD;  Location: [facility];  Service: Thoracic;  Laterality: N/A;  . PORT-A-CATH  REMOVAL Left 05/10/2019   Procedure: REMOVAL PORT-A-CATH;  Surgeon: Ivin Poot, MD;  Location: [facility];  Service: Thoracic;  Laterality: Left;  . PORTACATH PLACEMENT Left 02/21/2019   Procedure: INSERTION PORT-A-CATH;  Surgeon: Ivin Poot, MD;  Location: [facility];  Service: Thoracic;  Laterality: Left;  . REMOVAL OF PLEURAL DRAINAGE CATHETER Left 02/21/2019   Procedure: REMOVAL OF PLEURAL DRAINAGE CATHETER;  Surgeon: Ivin Poot, MD;  Location: [facility];  Service: Thoracic;  Laterality: Left;  Marland Kitchen VIDEO BRONCHOSCOPY Bilateral 10/21/2016   Procedure: VIDEO BRONCHOSCOPY WITH FLUORO;  Surgeon: Tanda Rockers, MD;  Location: [facility];  Service: Cardiopulmonary;  Laterality: Bilateral;    REVIEW OF SYSTEMS:  Constitutional: positive for fatigue Eyes: negative Ears, nose, mouth, throat, and face: negative Respiratory: negative Cardiovascular: negative Gastrointestinal: negative Genitourinary:negative Integument/breast: negative Hematologic/lymphatic: negative Musculoskeletal:negative Neurological: negative Behavioral/Psych: negative Endocrine: negative Allergic/Immunologic: negative   PHYSICAL EXAMINATION: General  appearance: alert, cooperative, fatigued and no distress Head: Normocephalic, without obvious abnormality, atraumatic Neck: no adenopathy, no JVD, supple, symmetrical, trachea midline and thyroid not enlarged, symmetric, no tenderness/mass/nodules Lymph nodes: Cervical, supraclavicular, and axillary nodes normal. Resp: clear to auscultation bilaterally Back: symmetric, no curvature. ROM normal. No CVA tenderness. Cardio: regular rate and rhythm, S1, S2 normal, no murmur, click, rub or gallop GI: soft, non-tender; bowel sounds normal; no masses,  no organomegaly Extremities: extremities normal, atraumatic, no cyanosis or edema Neurologic: Alert and oriented X 3, normal strength and tone. Normal symmetric reflexes. Normal coordination and gait  ECOG PERFORMANCE STATUS: 1 - Symptomatic but completely ambulatory  Blood pressure 135/80, pulse 96, temperature 97.7 F (36.5 C), temperature source Temporal, resp. rate 18, height _0  (1.676 m), weight 194 lb 14.4 oz (88.4 kg), last menstrual period 03/15/2010, SpO2 100 %.  LABORATORY DATA: Lab Results  Component Value Date   WBC 5.8 06/23/2019   HGB 9.6 (L) 06/23/2019   HCT 32.1 (L) 06/23/2019   MCV 106.3 (H) 06/23/2019   PLT 186 06/23/2019      Chemistry      Component Value Date/Time   NA 141 06/23/2019 1134   NA 135 (L) 01/08/2017 1055   K 3.7 06/23/2019 1134   K 3.2 (L) 01/08/2017 1055   CL 104 06/23/2019 1134   CO2 26 06/23/2019 1134   CO2 25 01/08/2017 1055   BUN 12 06/23/2019 1134   BUN 14.6 01/08/2017 1055   CREATININE 0.91 06/23/2019 1134   CREATININE 1.3 (H) 01/08/2017 1055      Component Value Date/Time   CALCIUM 9.6 06/23/2019 1134   CALCIUM 9.5 01/08/2017 1055   ALKPHOS 75 06/23/2019 1134   ALKPHOS 82 01/08/2017 1055   AST 27 06/23/2019 1134   AST 31 01/08/2017 1055   ALT 18 06/23/2019 1134   ALT 34 01/08/2017 1055   BILITOT 0.3 06/23/2019 1134   BILITOT 1.74 (H) 01/08/2017 1055       RADIOGRAPHIC  STUDIES: CT Chest W Contrast  Result Date: 06/21/2019 CLINICAL DATA:  Lung cancer restaging. EXAM: CT CHEST, ABDOMEN, AND PELVIS WITH CONTRAST TECHNIQUE: Multidetector CT imaging of the chest, abdomen and pelvis was performed following the standard protocol during bolus administration of intravenous contrast. CONTRAST:  155m OMNIPAQUE IOHEXOL 300 MG/ML  SOLN COMPARISON:  04/12/2019 FINDINGS: CT CHEST FINDINGS Cardiovascular: Normal heart size. Unchanged small pericardial effusion. Aortic atherosclerosis. Mediastinum/Nodes: Normal appearance of the thyroid gland. The trachea appears patent and is midline. Normal appearance of the esophagus. 9 mm low left paratracheal lymph node  is stable, image 19/2. No hilar adenopathy. Lungs/Pleura: Stable appearance of small partially loculated left pleural effusion and diffuse pleural thickening. Right pleural effusion has resolved. Diffuse miliary nodularity throughout both lungs with interstitial thickening and Peri lymphatic nodularity is again noted compatible with diffuse lymphangitic spread of tumor throughout both lungs. More confluent index nodular lesion within the superior segment of right lower lobe measures 1.7 x 0.9 cm, image 33/4. On the previous examination this measured 2.1 x 1.0 cm. Index confluent nodular density within the superior segment of left lower lobe measures 1.5 x 0.9 cm, image 55/4. Previously 1.5 x 1.0 cm. Index nodular density within the posterior left lower lobe measures 1.1 by 0.8 cm, image 79/4. Previously 1.2 x 0.9 cm. Musculoskeletal: No chest wall mass or suspicious bone lesions identified. CT ABDOMEN PELVIS FINDINGS Hepatobiliary: No focal liver abnormality is seen. No gallstones, gallbladder wall thickening, or biliary dilatation. Pancreas: Unremarkable. No pancreatic ductal dilatation or surrounding inflammatory changes. Spleen: Normal in size without focal abnormality. Adrenals/Urinary Tract: Normal adrenal glands. Normal appearance of  the kidneys. No mass or hydronephrosis. Urinary bladder is unremarkable. Stomach/Bowel: Stomach is within normal limits. Appendix appears normal. No evidence of bowel wall thickening, distention, or inflammatory changes. Vascular/Lymphatic: Aortic atherosclerosis without aneurysm. No abdominal or pelvic adenopathy. Reproductive: Status post hysterectomy. No adnexal masses. Other: Fat containing umbilical hernia. No abdominopelvic ascites. Musculoskeletal: No acute or significant osseous findings. IMPRESSION: 1. Similar appearance of diffuse miliary nodularity throughout both lungs with interstitial thickening and peri lymphatic nodularity compatible with diffuse lymphangitic spread of tumor. Index lesions are not significantly changed in the interval. 2. Stable appearance of small partially loculated left pleural effusion and diffuse pleural thickening. 3. Resolution of right pleural effusion. 4. No evidence for abdominopelvic metastasis. 5. Aortic atherosclerosis. Aortic Atherosclerosis (ICD10-I70.0). Electronically Signed   By: Kerby Moors M.D.   On: 06/21/2019 13:11   MR Brain W Wo Contrast  Result Date: 06/23/2019 CLINICAL DATA:  Restaging of left lung adenocarcinoma. History of brain metastases. EXAM: MRI HEAD WITHOUT AND WITH CONTRAST TECHNIQUE: Multiplanar, multiecho pulse sequences of the brain and surrounding structures were obtained without and with intravenous contrast. CONTRAST:  27m GADAVIST GADOBUTROL 1 MMOL/ML IV SOLN COMPARISON:  Brain MRI 04/14/2019 FINDINGS: Brain: No acute infarct, acute hemorrhage or extra-axial collection. Normal white matter signal. Normal volume of CSF spaces. Numerous foci of chronic hemosiderin deposition throughout both hemispheres, corresponding to previously treated metastases and unchanged from the prior study. Normal midline structures. Vascular: Normal flow voids. Skull and upper cervical spine: Normal marrow signal. Sinuses/Orbits: Negative. Other: None.  IMPRESSION: 1. No intracranial metastatic disease. 2. Numerous foci of chronic hemosiderin deposition throughout both hemispheres, corresponding to previously treated metastases. 3. No acute abnormality. Electronically Signed   By: KUlyses JarredM.D.   On: 06/23/2019 00:06   CT Abdomen Pelvis W Contrast  Result Date: 06/21/2019 CLINICAL DATA:  Lung cancer restaging. EXAM: CT CHEST, ABDOMEN, AND PELVIS WITH CONTRAST TECHNIQUE: Multidetector CT imaging of the chest, abdomen and pelvis was performed following the standard protocol during bolus administration of intravenous contrast. CONTRAST:  1036mOMNIPAQUE IOHEXOL 300 MG/ML  SOLN COMPARISON:  04/12/2019 FINDINGS: CT CHEST FINDINGS Cardiovascular: Normal heart size. Unchanged small pericardial effusion. Aortic atherosclerosis. Mediastinum/Nodes: Normal appearance of the thyroid gland. The trachea appears patent and is midline. Normal appearance of the esophagus. 9 mm low left paratracheal lymph node is stable, image 19/2. No hilar adenopathy. Lungs/Pleura: Stable appearance of small partially loculated left pleural effusion and  diffuse pleural thickening. Right pleural effusion has resolved. Diffuse miliary nodularity throughout both lungs with interstitial thickening and Peri lymphatic nodularity is again noted compatible with diffuse lymphangitic spread of tumor throughout both lungs. More confluent index nodular lesion within the superior segment of right lower lobe measures 1.7 x 0.9 cm, image 33/4. On the previous examination this measured 2.1 x 1.0 cm. Index confluent nodular density within the superior segment of left lower lobe measures 1.5 x 0.9 cm, image 55/4. Previously 1.5 x 1.0 cm. Index nodular density within the posterior left lower lobe measures 1.1 by 0.8 cm, image 79/4. Previously 1.2 x 0.9 cm. Musculoskeletal: No chest wall mass or suspicious bone lesions identified. CT ABDOMEN PELVIS FINDINGS Hepatobiliary: No focal liver abnormality is seen.  No gallstones, gallbladder wall thickening, or biliary dilatation. Pancreas: Unremarkable. No pancreatic ductal dilatation or surrounding inflammatory changes. Spleen: Normal in size without focal abnormality. Adrenals/Urinary Tract: Normal adrenal glands. Normal appearance of the kidneys. No mass or hydronephrosis. Urinary bladder is unremarkable. Stomach/Bowel: Stomach is within normal limits. Appendix appears normal. No evidence of bowel wall thickening, distention, or inflammatory changes. Vascular/Lymphatic: Aortic atherosclerosis without aneurysm. No abdominal or pelvic adenopathy. Reproductive: Status post hysterectomy. No adnexal masses. Other: Fat containing umbilical hernia. No abdominopelvic ascites. Musculoskeletal: No acute or significant osseous findings. IMPRESSION: 1. Similar appearance of diffuse miliary nodularity throughout both lungs with interstitial thickening and peri lymphatic nodularity compatible with diffuse lymphangitic spread of tumor. Index lesions are not significantly changed in the interval. 2. Stable appearance of small partially loculated left pleural effusion and diffuse pleural thickening. 3. Resolution of right pleural effusion. 4. No evidence for abdominopelvic metastasis. 5. Aortic atherosclerosis. Aortic Atherosclerosis (ICD10-I70.0). Electronically Signed   By: Kerby Moors M.D.   On: 06/21/2019 13:11    ASSESSMENT AND PLAN: This is a very pleasant 54 years old African-American female with metastatic non-small cell lung cancer, adenocarcinoma and positive for EGFR mutation with deletion in exon 19.  The patient is currently on treatment with GILOTRIF 40 mg p.o. daily status post 13 months.  The patient is tolerating this treatment well except for few episodes of diarrhea and mild skin rash. She is complaining of dizzy spells and vertigo recently. Repeat CT scan of the chest, abdomen and pelvis performed recently showed some evidence for disease progression with  increase in size and number of pulmonary nodules. Her treatment was switched to Tagrisso 80 mg p.o. daily started January 01, 2018.  She is status post 12 months of treatment The patient has been tolerating this treatment well with no concerning adverse effects. She had repeat MRI of the brain as well as CT scan of the chest, abdomen pelvis performed recently. Her MRI of the brain showed no concerning findings for disease progression in the brain but CT scan of the chest showed some evidence for progression with lymphangitic spread of the disease. She was referred to Dr. Durenda Hurt at [facility] and repeat molecular studies showed the emergence of BRAF mutation V600E. I recommended for the patient to consider systemic chemotherapy and she is here today for evaluation and discussion of this option.  I had a lengthy discussion with the patient today regarding her condition.  She understands that a combination of BRAF inhibitor and Tagrisso has not been well studied at this point. She is currently undergoing systemic chemotherapy with carboplatin for AUC of 5, Alimta 500 mg/M2 and Avastin 15 mg/KG every 3 weeks, status post 6 cycles.  She also continues her  current treatment with Tagrisso 80 mg p.o. daily. The patient has been tolerating her treatment well with no concerning adverse effects. She had repeat CT scan of the chest, abdomen pelvis as well as MRI of the brain.  I personally and independently reviewed the scan images and discussed the results with the patient today. Her scan showed no concerning findings for disease progression in the brain or systemically. I recommended for her to continue her current treatment and starting from cycle #7 she will be on maintenance treatment with Alimta and Avastin in addition to her current treatment with Tagrisso. The patient will be referred to interventional audiology for consideration of Port-A-Cath placement in around 10 days from now because of the  Avastin treatment. She will come back for follow-up visit in 3 weeks for evaluation before the next cycle of her treatment. She was advised to call immediately if she has any concerning symptoms in the interval. The patient voices understanding of current disease status and treatment options and is in agreement with the current care plan. All questions were answered. The patient knows to call the clinic with any problems, questions or concerns. We can certainly see the patient much sooner if necessary.  Disclaimer: This note was dictated with voice recognition software. Similar sounding words can inadvertently be transcribed and may not be corrected upon review.

## 2019-06-23 NOTE — Progress Notes (Signed)
Per Dr Julien Nordmann it is okay to treat pt today with avastin ( [facility]) and urine protein of 30 .

## 2019-06-24 ENCOUNTER — Telehealth: Payer: Self-pay | Admitting: Medical Oncology

## 2019-06-24 ENCOUNTER — Other Ambulatory Visit: Payer: Self-pay | Admitting: Physician Assistant

## 2019-06-24 ENCOUNTER — Other Ambulatory Visit: Payer: Self-pay | Admitting: Medical Oncology

## 2019-06-24 DIAGNOSIS — C3492 Malignant neoplasm of unspecified part of left bronchus or lung: Secondary | ICD-10-CM

## 2019-06-24 MED ORDER — OSIMERTINIB MESYLATE 80 MG PO TABS: 80.0000 mg | ORAL_TABLET | Freq: Every day | ORAL | 2 refills | Status: DC

## 2019-06-24 MED FILL — POTASSIUM CHLORIDE CRYS ER: 20 | 30 days supply | Qty: 60 | Fill #9

## 2019-06-24 MED FILL — LINZESS 145 MCG CAPSULE: 145 | 30 days supply | Qty: 30 | Fill #1

## 2019-06-24 MED FILL — FUROSEMIDE 40 MG TAB: 40 | 30 days supply | Qty: 30 | Fill #5

## 2019-06-24 MED FILL — ONDANSETRON HCL 8 MG TABLET: 8 | 10 days supply | Qty: 30 | Fill #1

## 2019-06-24 NOTE — Telephone Encounter (Signed)
Pt uses [facility] now.  tagrisso faxed to [facility] .

## 2019-06-24 NOTE — Telephone Encounter (Signed)
LM for Lillymae that Tagrisso plus refills was sent to [facility] today.

## 2019-06-28 ENCOUNTER — Telehealth: Payer: Self-pay | Admitting: *Deleted

## 2019-06-28 ENCOUNTER — Telehealth: Payer: Self-pay

## 2019-06-28 NOTE — Telephone Encounter (Signed)
I received a call from Nancy Moore.  She has some concerns about her oral biologic coverage.  I notified oral pharmacy tech and asked her to reach out to [facility].  Nancy Moore was thankful for the help.

## 2019-06-28 NOTE — Telephone Encounter (Signed)
Patient is switching from commercial insurance to a [employer] plan. I informed patient that she would need to sign up for a Medicare D plan before I could obtain assistance from grant funding or manufacturer.  A grant opened on 5/6, so I was able to get that by using her Medicare B ID number, as she had not signed up for Medicare D yet. Nancy Moore can not be used until patient signs up for Medicare D plan because the pharmacy has to bill that first.  I called patient today and she informed me that she did sign up for a Medicare D plan last night but it would take up to 3 days to load in the system. I will follow up in 3 days.  Nancy Moore Phone [phone] Fax [phone] 06/28/2019 3:06 PM

## 2019-06-30 ENCOUNTER — Other Ambulatory Visit: Payer: Self-pay | Admitting: Radiology

## 2019-06-30 ENCOUNTER — Telehealth: Payer: Self-pay | Admitting: Pharmacist

## 2019-06-30 ENCOUNTER — Inpatient Hospital Stay: Payer: Medicare Other

## 2019-06-30 DIAGNOSIS — C3492 Malignant neoplasm of unspecified part of left bronchus or lung: Secondary | ICD-10-CM

## 2019-06-30 MED ORDER — OSIMERTINIB MESYLATE 80 MG PO TABS: 80.0000 mg | ORAL_TABLET | Freq: Every day | ORAL | 2 refills | Status: DC

## 2019-06-30 NOTE — Telephone Encounter (Signed)
Oral Chemotherapy Pharmacist Encounter   Patient now had Medicare Part D. Patient advocate, Benjamine Mola, obtained a copay grant for Nancy Moore. Prescription was sent to [facility].  Darl Pikes, PharmD, BCPS, BCOP, CPP Hematology/Oncology Clinical Pharmacist [facility]/AP [facility] [phone]  06/30/2019 10:07 AM

## 2019-07-01 ENCOUNTER — Other Ambulatory Visit: Payer: Self-pay | Admitting: Radiology

## 2019-07-01 ENCOUNTER — Other Ambulatory Visit: Payer: Self-pay

## 2019-07-01 ENCOUNTER — Telehealth: Payer: Self-pay

## 2019-07-01 ENCOUNTER — Inpatient Hospital Stay: Payer: Medicare Other | Attending: Oncology

## 2019-07-01 DIAGNOSIS — C3492 Malignant neoplasm of unspecified part of left bronchus or lung: Secondary | ICD-10-CM | POA: Diagnosis not present

## 2019-07-01 DIAGNOSIS — Z5112 Encounter for antineoplastic immunotherapy: Secondary | ICD-10-CM | POA: Diagnosis not present

## 2019-07-01 DIAGNOSIS — Z5111 Encounter for antineoplastic chemotherapy: Secondary | ICD-10-CM | POA: Diagnosis not present

## 2019-07-01 LAB — CBC WITH DIFFERENTIAL (CANCER CENTER ONLY)
Abs Immature Granulocytes: 0.03 10*3/uL (ref 0.00–0.07)
Basophils Absolute: 0 10*3/uL (ref 0.0–0.1)
Basophils Relative: 0 %
Eosinophils Absolute: 0 10*3/uL (ref 0.0–0.5)
Eosinophils Relative: 1 %
HCT: 30.8 % — ABNORMAL LOW (ref 36.0–46.0)
Hemoglobin: 9.4 g/dL — ABNORMAL LOW (ref 12.0–15.0)
Immature Granulocytes: 1 %
Lymphocytes Relative: 43 %
Lymphs Abs: 1.2 10*3/uL (ref 0.7–4.0)
MCH: 32.2 pg (ref 26.0–34.0)
MCHC: 30.5 g/dL (ref 30.0–36.0)
MCV: 105.5 fL — ABNORMAL HIGH (ref 80.0–100.0)
Monocytes Absolute: 0.7 10*3/uL (ref 0.1–1.0)
Monocytes Relative: 23 %
Neutro Abs: 0.9 10*3/uL — ABNORMAL LOW (ref 1.7–7.7)
Neutrophils Relative %: 32 %
Platelet Count: 183 10*3/uL (ref 150–400)
RBC: 2.92 MIL/uL — ABNORMAL LOW (ref 3.87–5.11)
RDW: 20.3 % — ABNORMAL HIGH (ref 11.5–15.5)
WBC Count: 2.9 10*3/uL — ABNORMAL LOW (ref 4.0–10.5)
nRBC: 0 % (ref 0.0–0.2)

## 2019-07-01 LAB — CMP (CANCER CENTER ONLY)
ALT: 19 U/L (ref 0–44)
AST: 24 U/L (ref 15–41)
Albumin: 3.4 g/dL — ABNORMAL LOW (ref 3.5–5.0)
Alkaline Phosphatase: 78 U/L (ref 38–126)
Anion gap: 10 (ref 5–15)
BUN: 11 mg/dL (ref 6–20)
CO2: 27 mmol/L (ref 22–32)
Calcium: 9.4 mg/dL (ref 8.9–10.3)
Chloride: 103 mmol/L (ref 98–111)
Creatinine: 0.97 mg/dL (ref 0.44–1.00)
GFR, Est AFR Am: >60 mL/min (ref >60)
GFR, Estimated: >60 mL/min (ref >60)
Glucose, Bld: 117 mg/dL — ABNORMAL HIGH (ref 70–99)
Potassium: 4.2 mmol/L (ref 3.5–5.1)
Sodium: 140 mmol/L (ref 135–145)
Total Bilirubin: 0.4 mg/dL (ref 0.3–1.2)
Total Protein: 6.6 g/dL (ref 6.5–8.1)

## 2019-07-01 NOTE — Telephone Encounter (Signed)
Oral Oncology Patient Advocate Encounter  Met patient in [place] to complete application for [place] Program in an effort to reduce the patient's out of pocket expense for Tagrisso to $0.    Application completed and faxed to [phone].   [place] phone number for follow up is [phone].   This encounter will be updated until final determination.  Patient has a Counselling psychologist we can use at [employer] first  [facility] Phone [phone] Fax [phone] 07/01/2019 2:31 PM

## 2019-07-04 ENCOUNTER — Encounter (HOSPITAL_COMMUNITY): Payer: Self-pay

## 2019-07-04 ENCOUNTER — Ambulatory Visit (HOSPITAL_COMMUNITY)
Admission: RE | Admit: 2019-07-04 | Discharge: 2019-07-04 | Disposition: A | Discharge status: Home / Self Care | Payer: Medicare Other | Source: Ambulatory Visit | Attending: Internal Medicine | Admitting: Internal Medicine

## 2019-07-04 ENCOUNTER — Other Ambulatory Visit: Payer: Self-pay

## 2019-07-04 ENCOUNTER — Other Ambulatory Visit: Payer: Self-pay | Admitting: Internal Medicine

## 2019-07-04 DIAGNOSIS — I10 Essential (primary) hypertension: Secondary | ICD-10-CM | POA: Diagnosis not present

## 2019-07-04 DIAGNOSIS — Z79899 Other long term (current) drug therapy: Secondary | ICD-10-CM | POA: Diagnosis not present

## 2019-07-04 DIAGNOSIS — K219 Gastro-esophageal reflux disease without esophagitis: Secondary | ICD-10-CM | POA: Insufficient documentation

## 2019-07-04 DIAGNOSIS — C3492 Malignant neoplasm of unspecified part of left bronchus or lung: Secondary | ICD-10-CM | POA: Diagnosis not present

## 2019-07-04 DIAGNOSIS — C3490 Malignant neoplasm of unspecified part of unspecified bronchus or lung: Secondary | ICD-10-CM | POA: Diagnosis not present

## 2019-07-04 DIAGNOSIS — Z452 Encounter for adjustment and management of vascular access device: Secondary | ICD-10-CM | POA: Diagnosis not present

## 2019-07-04 HISTORY — PX: IR IMAGING GUIDED PORT INSERTION: IMG5740

## 2019-07-04 LAB — CBC WITH DIFFERENTIAL/PLATELET
Abs Immature Granulocytes: 0.03 10*3/uL (ref 0.00–0.07)
Basophils Absolute: 0 10*3/uL (ref 0.0–0.1)
Basophils Relative: 0 %
Eosinophils Absolute: 0 10*3/uL (ref 0.0–0.5)
Eosinophils Relative: 1 %
HCT: 32.9 % — ABNORMAL LOW (ref 36.0–46.0)
Hemoglobin: 10 g/dL — ABNORMAL LOW (ref 12.0–15.0)
Immature Granulocytes: 1 %
Lymphocytes Relative: 31 %
Lymphs Abs: 1.7 10*3/uL (ref 0.7–4.0)
MCH: 33.1 pg (ref 26.0–34.0)
MCHC: 30.4 g/dL (ref 30.0–36.0)
MCV: 108.9 fL — ABNORMAL HIGH (ref 80.0–100.0)
Monocytes Absolute: 1.6 10*3/uL — ABNORMAL HIGH (ref 0.1–1.0)
Monocytes Relative: 29 %
Neutro Abs: 2.2 10*3/uL (ref 1.7–7.7)
Neutrophils Relative %: 38 %
Platelets: 196 10*3/uL (ref 150–400)
RBC: 3.02 MIL/uL — ABNORMAL LOW (ref 3.87–5.11)
RDW: 20.2 % — ABNORMAL HIGH (ref 11.5–15.5)
WBC: 5.6 10*3/uL (ref 4.0–10.5)
nRBC: 0 % (ref 0.0–0.2)

## 2019-07-04 LAB — PROTIME-INR
INR: 1.1 (ref 0.8–1.2)
Prothrombin Time: 13.4 seconds (ref 11.4–15.2)

## 2019-07-04 MED ORDER — LIDOCAINE HCL (PF) 1 % IJ SOLN
INTRAMUSCULAR | Status: AC | PRN
  Administered 2019-07-04: 10 mL

## 2019-07-04 MED ORDER — MIDAZOLAM HCL 2 MG/2ML IJ SOLN
INTRAMUSCULAR | Status: AC | PRN
  Administered 2019-07-04: 1 mg via INTRAVENOUS
  Administered 2019-07-04: 2 mg via INTRAVENOUS
  Administered 2019-07-04 (×3): 1 mg via INTRAVENOUS

## 2019-07-04 MED ORDER — LIDOCAINE-EPINEPHRINE 1 %-1:100000 IJ SOLN
INTRAMUSCULAR | Status: AC
  Filled 2019-07-04: qty 1

## 2019-07-04 MED ORDER — HEPARIN SOD (PORK) LOCK FLUSH 100 UNIT/ML IV SOLN
INTRAVENOUS | Status: AC | PRN
  Administered 2019-07-04: 500 [IU] via INTRAVENOUS

## 2019-07-04 MED ORDER — CEFAZOLIN SODIUM-DEXTROSE 2-4 GM/100ML-% IV SOLN
INTRAVENOUS | Status: AC
  Administered 2019-07-04: 2 g via INTRAVENOUS
  Filled 2019-07-04: qty 100

## 2019-07-04 MED ORDER — MIDAZOLAM HCL 2 MG/2ML IJ SOLN
INTRAMUSCULAR | Status: AC
  Filled 2019-07-04: qty 4

## 2019-07-04 MED ORDER — FENTANYL CITRATE (PF) 100 MCG/2ML IJ SOLN
INTRAMUSCULAR | Status: AC
  Filled 2019-07-04: qty 2

## 2019-07-04 MED ORDER — CEFAZOLIN SODIUM-DEXTROSE 2-4 GM/100ML-% IV SOLN: 2.0000 g | INTRAVENOUS | Status: AC

## 2019-07-04 MED ORDER — HEPARIN SOD (PORK) LOCK FLUSH 100 UNIT/ML IV SOLN
INTRAVENOUS | Status: AC
  Filled 2019-07-04: qty 5

## 2019-07-04 MED ORDER — FENTANYL CITRATE (PF) 100 MCG/2ML IJ SOLN
INTRAMUSCULAR | Status: AC | PRN
  Administered 2019-07-04 (×4): 50 ug via INTRAVENOUS

## 2019-07-04 MED ORDER — MIDAZOLAM HCL 2 MG/2ML IJ SOLN
INTRAMUSCULAR | Status: AC
  Filled 2019-07-04: qty 2

## 2019-07-04 NOTE — Consult Note (Signed)
Chief Complaint: Chemotherapy access  Referring Physician(s): Dr. Curt Bears  Supervising Physician: Jacqulynn Cadet  Patient Status: [name] - Out-pt  History of Present Illness: Nancy Moore is a 54 y.o. female History of NSCL adenocarcinoma bilaterally originally diagnosed in  2018  found to have disease progression. Team is requesting portacath placement for chemotherapy access. Patient has a previous port placed by surgery on 1.25.21 removed on 4.13.21. Patient states that the port was placed on the left side due to occlusion    Past Medical History:  Diagnosis Date  . Adenocarcinoma of left lung, stage 4 ([place]) 10/28/2016  . Anemia   . Arthritis   . Constipation   . Crohn's disease ([place])    in remission 35 years +  . Dyspnea    gets "winded"  . GERD (gastroesophageal reflux disease)   . Goals of care, counseling/discussion 10/28/2016  . History of hiatal hernia    noted on CT 12/20  . Hypertension   . Pneumonia    walking pneumonia in the late 80's  . Recurrent pleural effusion on left     Past Surgical History:  Procedure Laterality Date  . ABDOMINAL HYSTERECTOMY     partial hysterectomy  . BREAST EXCISIONAL BIOPSY Left 20+ yrs ago   benign  . CHEST TUBE INSERTION Left 10/26/2018   Procedure: INSERTION PLEURAL DRAINAGE CATHETER;  Surgeon: Ivin Poot, MD;  Location: [place];  Service: Thoracic;  Laterality: Left;  . COLONOSCOPY    . PERICARDIAL WINDOW N/A 01/10/2017   Procedure: PERICARDIAL WINDOW- SUB XYPHOID, RIGHT CHEST TUBE;  Surgeon: Ivin Poot, MD;  Location: [place];  Service: Thoracic;  Laterality: N/A;  . PORT-A-CATH REMOVAL Left 05/10/2019   Procedure: REMOVAL PORT-A-CATH;  Surgeon: Ivin Poot, MD;  Location: [place];  Service: Thoracic;  Laterality: Left;  . PORTACATH PLACEMENT Left 02/21/2019   Procedure: INSERTION PORT-A-CATH;  Surgeon: Ivin Poot, MD;  Location: [place];  Service: Thoracic;  Laterality: Left;  . REMOVAL OF  PLEURAL DRAINAGE CATHETER Left 02/21/2019   Procedure: REMOVAL OF PLEURAL DRAINAGE CATHETER;  Surgeon: Ivin Poot, MD;  Location: [place];  Service: Thoracic;  Laterality: Left;  Marland Kitchen VIDEO BRONCHOSCOPY Bilateral 10/21/2016   Procedure: VIDEO BRONCHOSCOPY WITH FLUORO;  Surgeon: Tanda Rockers, MD;  Location: [facility];  Service: Cardiopulmonary;  Laterality: Bilateral;    Allergies: Percocet [oxycodone-acetaminophen], Lisinopril, and Losartan  Medications: Prior to Admission medications   Medication Sig Start Date End Date Taking? Authorizing Provider  clindamycin (CLINDAGEL) 1 % gel Apply 1 application topically 2 (two) times daily. 12/30/18  Yes Copland, Gay Filler, MD  fluticasone (FLONASE) 50 MCG/ACT nasal spray Place 2 sprays into both nostrils daily as needed for allergies. 05/24/19  Yes Copland, Gay Filler, MD  folic acid (FOLVITE) 1 MG tablet Take 1 tablet (1 mg total) by mouth daily. 02/03/19  Yes Curt Bears, MD  furosemide (LASIX) 40 MG tablet TAKE 1 TABLET BY MOUTH ONCE DAILY Patient taking differently: Take 40 mg by mouth daily.  12/02/18  Yes Copland, Gay Filler, MD  guaiFENesin (MUCINEX) 600 MG 12 hr tablet Take 600 mg by mouth 2 (two) times daily as needed for to loosen phlegm.    Yes [provider]  linaclotide Rolan Lipa) 145 MCG CAPS capsule Take 1 capsule (145 mcg total) by mouth daily before breakfast. 05/04/19  Yes Copland, Gay Filler, MD  loratadine (CLARITIN) 10 MG tablet Take 10 mg by mouth daily.   Yes [provider]  magnesium oxide (MAG-OX) 400 (241.3 Mg) MG tablet Take 1 tablet (400 mg total) by mouth daily. 01/23/17  Yes Rama, Venetia Maxon, MD  nystatin cream (MYCOSTATIN) APPLY 1 APPLICATION TOPICALLY 2 (TWO) TIMES DAILY. Patient taking differently: Apply 1 application topically daily as needed (skin irritaion).  05/05/19  Yes Copland, Gay Filler, MD  ondansetron (ZOFRAN) 8 MG tablet TAKE 1 TABLET BY MOUTH EVERY 8 HOURS AS NEEDED FOR NAUSEA OR  VOMITING Patient taking differently: Take 8 mg by mouth every 8 (eight) hours as needed for nausea or vomiting.  04/15/19  Yes Heilingoetter, Cassandra L, PA-C  osimertinib mesylate (TAGRISSO) 80 MG tablet Take 1 tablet (80 mg total) by mouth daily. 06/30/19  Yes Curt Bears, MD  Oxycodone HCl 10 MG TABS Take 1 tablet (10 mg total) by mouth 3 (three) times daily as needed. Patient taking differently: Take 10 mg by mouth 2 (two) times daily as needed (Pain).  04/15/19  Yes Heilingoetter, Cassandra L, PA-C  pantoprazole (PROTONIX) 40 MG tablet TAKE 1 TABLET (40 MG TOTAL) BY MOUTH DAILY AT 12 NOON. Patient taking differently: Take 40 mg by mouth daily.  08/25/18  Yes Copland, Gay Filler, MD  potassium chloride SA (K-DUR) 20 MEQ tablet TAKE 1 TABLET (20 MEQ TOTAL) BY MOUTH 2 TIMES DAILY. Patient taking differently: Take 20 mEq by mouth 2 (two) times daily.  08/25/18  Yes Copland, Gay Filler, MD  prochlorperazine (COMPAZINE) 10 MG tablet Take 1 tablet (10 mg total) by mouth every 6 (six) hours as needed for nausea or vomiting. 04/15/19  Yes Heilingoetter, Cassandra L, PA-C  senna-docusate (SENOKOT-S) 8.6-50 MG tablet Take 1 tablet by mouth at bedtime. 01/22/17  Yes Rama, Venetia Maxon, MD  traMADol (ULTRAM) 50 MG tablet Take 1 tablet (50 mg total) by mouth every 6 (six) hours as needed. 05/10/19  Yes Ivin Poot, MD  albuterol (VENTOLIN HFA) 108 (90 Base) MCG/ACT inhaler Inhale 2 puffs into the lungs every 6 (six) hours as needed for wheezing or shortness of breath. 02/03/19   Curt Bears, MD  dexamethasone (DECADRON) 4 MG tablet 1 tablet p.o. twice daily the day before, day of and day after chemotherapy every 3 weeks Patient taking differently: Take 4 mg by mouth See admin instructions. 4 mg  twice daily the day before, day of and day after chemotherapy every 3 weeks 02/24/19   Curt Bears, MD  HYDROcodone-homatropine [facility]) 5-1.5 MG/5ML syrup Take 5 mLs by mouth every 6 (six) hours as needed for  cough. 01/13/19   Curt Bears, MD  lidocaine-prilocaine (EMLA) cream APPLY TO THE AFFECTED AREA(S) AS NEEDED Patient taking differently: Apply 1 application topically as needed [facility]).  05/05/19   Curt Bears, MD  loperamide (IMODIUM) 1 MG/5ML solution Take 10 mLs (2 mg total) by mouth as needed for diarrhea or loose stools. Patient taking differently: Take 2 mg by mouth daily as needed for diarrhea or loose stools.  03/24/19   Curt Bears, MD  methocarbamol (ROBAXIN) 500 MG tablet TAKE 1 TABLET (500 MG TOTAL) BY MOUTH EVERY 8 (EIGHT) HOURS AS NEEDED FOR MUSCLE SPASMS. 12/02/18   Copland, Gay Filler, MD  TAGRISSO 80 MG tablet TAKE 1 TABLET BY MOUTH ONCE DAILY 10/15/18   Curt Bears, MD     Family History  Problem Relation Age of Onset  . Asthma Mother   . Stroke Mother   . Hypertension Mother   . Heart attack Father   . Hypertension Father   . Hyperlipidemia Father   .  Dementia Father   . Emphysema Maternal Grandmother   . Hypertension Maternal Grandmother   . Colon cancer Paternal Grandmother   . Brain cancer Maternal Uncle     Social History   Socioeconomic History  . Marital status: Married    Spouse name: Not on file  . Number of children: Not on file  . Years of education: Not on file  . Highest education level: Not on file  Occupational History  . Not on file  Tobacco Use  . Smoking status: Never Smoker  . Smokeless tobacco: Never Used  Substance and Sexual Activity  . Alcohol use: Not Currently    Comment: socially  . Drug use: No  . Sexual activity: Never  Other Topics Concern  . Not on file  Social History Narrative  . Not on file   Social Determinants of Health   Financial Resource Strain:   . Difficulty of Paying Living Expenses:   Food Insecurity:   . Worried About Charity fundraiser in the Last Year:   . Arboriculturist in the Last Year:   Transportation Needs:   . Film/video editor (Medical):   Marland Kitchen Lack of Transportation  (Non-Medical):   Physical Activity:   . Days of Exercise per Week:   . Minutes of Exercise per Session:   Stress:   . Feeling of Stress :   Social Connections:   . Frequency of Communication with Friends and Family:   . Frequency of Social Gatherings with Friends and Family:   . Attends Religious Services:   . Active Member of Clubs or Organizations:   . Attends Archivist Meetings:   Marland Kitchen Marital Status:      Review of Systems: A 12 point ROS discussed and pertinent positives are indicated in the HPI above.  All other systems are negative.  Review of Systems  Constitutional: Negative for fatigue and fever.  HENT: Negative for congestion.   Respiratory: Negative for cough and shortness of breath.   Gastrointestinal: Positive for nausea ( self resolved). Negative for abdominal pain, diarrhea and vomiting.    Vital Signs: BP 127/83 (BP Location: Right Arm)   Pulse 96   Temp 98.2 F (36.8 C) (Oral)   Resp 18   LMP 03/15/2010   SpO2 100%   Physical Exam Vitals and nursing note reviewed.  Constitutional:      Appearance: She is well-developed.  HENT:     Head: Normocephalic and atraumatic.  Eyes:     Conjunctiva/sclera: Conjunctivae normal.  Cardiovascular:     Rate and Rhythm: Normal rate and regular rhythm.     Heart sounds: Normal heart sounds.  Pulmonary:     Effort: Pulmonary effort is normal.     Breath sounds: Normal breath sounds.  Musculoskeletal:     Cervical back: Normal range of motion.  Neurological:     Mental Status: She is alert and oriented to person, place, and time.     Imaging: CT Chest W Contrast  Result Date: 06/21/2019 CLINICAL DATA:  Lung cancer restaging. EXAM: CT CHEST, ABDOMEN, AND PELVIS WITH CONTRAST TECHNIQUE: Multidetector CT imaging of the chest, abdomen and pelvis was performed following the standard protocol during bolus administration of intravenous contrast. CONTRAST:  179m OMNIPAQUE IOHEXOL 300 MG/ML  SOLN COMPARISON:   04/12/2019 FINDINGS: CT CHEST FINDINGS Cardiovascular: Normal heart size. Unchanged small pericardial effusion. Aortic atherosclerosis. Mediastinum/Nodes: Normal appearance of the thyroid gland. The trachea appears patent and is midline. Normal appearance  of the esophagus. 9 mm low left paratracheal lymph node is stable, image 19/2. No hilar adenopathy. Lungs/Pleura: Stable appearance of small partially loculated left pleural effusion and diffuse pleural thickening. Right pleural effusion has resolved. Diffuse miliary nodularity throughout both lungs with interstitial thickening and Peri lymphatic nodularity is again noted compatible with diffuse lymphangitic spread of tumor throughout both lungs. More confluent index nodular lesion within the superior segment of right lower lobe measures 1.7 x 0.9 cm, image 33/4. On the previous examination this measured 2.1 x 1.0 cm. Index confluent nodular density within the superior segment of left lower lobe measures 1.5 x 0.9 cm, image 55/4. Previously 1.5 x 1.0 cm. Index nodular density within the posterior left lower lobe measures 1.1 by 0.8 cm, image 79/4. Previously 1.2 x 0.9 cm. Musculoskeletal: No chest wall mass or suspicious bone lesions identified. CT ABDOMEN PELVIS FINDINGS Hepatobiliary: No focal liver abnormality is seen. No gallstones, gallbladder wall thickening, or biliary dilatation. Pancreas: Unremarkable. No pancreatic ductal dilatation or surrounding inflammatory changes. Spleen: Normal in size without focal abnormality. Adrenals/Urinary Tract: Normal adrenal glands. Normal appearance of the kidneys. No mass or hydronephrosis. Urinary bladder is unremarkable. Stomach/Bowel: Stomach is within normal limits. Appendix appears normal. No evidence of bowel wall thickening, distention, or inflammatory changes. Vascular/Lymphatic: Aortic atherosclerosis without aneurysm. No abdominal or pelvic adenopathy. Reproductive: Status post hysterectomy. No adnexal masses.  Other: Fat containing umbilical hernia. No abdominopelvic ascites. Musculoskeletal: No acute or significant osseous findings. IMPRESSION: 1. Similar appearance of diffuse miliary nodularity throughout both lungs with interstitial thickening and peri lymphatic nodularity compatible with diffuse lymphangitic spread of tumor. Index lesions are not significantly changed in the interval. 2. Stable appearance of small partially loculated left pleural effusion and diffuse pleural thickening. 3. Resolution of right pleural effusion. 4. No evidence for abdominopelvic metastasis. 5. Aortic atherosclerosis. Aortic Atherosclerosis (ICD10-I70.0). Electronically Signed   By: Kerby Moors M.D.   On: 06/21/2019 13:11   MR Brain W Wo Contrast  Result Date: 06/23/2019 CLINICAL DATA:  Restaging of left lung adenocarcinoma. History of brain metastases. EXAM: MRI HEAD WITHOUT AND WITH CONTRAST TECHNIQUE: Multiplanar, multiecho pulse sequences of the brain and surrounding structures were obtained without and with intravenous contrast. CONTRAST:  57m GADAVIST GADOBUTROL 1 MMOL/ML IV SOLN COMPARISON:  Brain MRI 04/14/2019 FINDINGS: Brain: No acute infarct, acute hemorrhage or extra-axial collection. Normal white matter signal. Normal volume of CSF spaces. Numerous foci of chronic hemosiderin deposition throughout both hemispheres, corresponding to previously treated metastases and unchanged from the prior study. Normal midline structures. Vascular: Normal flow voids. Skull and upper cervical spine: Normal marrow signal. Sinuses/Orbits: Negative. Other: None. IMPRESSION: 1. No intracranial metastatic disease. 2. Numerous foci of chronic hemosiderin deposition throughout both hemispheres, corresponding to previously treated metastases. 3. No acute abnormality. Electronically Signed   By: KUlyses JarredM.D.   On: 06/23/2019 00:06   CT Abdomen Pelvis W Contrast  Result Date: 06/21/2019 CLINICAL DATA:  Lung cancer restaging. EXAM: CT  CHEST, ABDOMEN, AND PELVIS WITH CONTRAST TECHNIQUE: Multidetector CT imaging of the chest, abdomen and pelvis was performed following the standard protocol during bolus administration of intravenous contrast. CONTRAST:  1064mOMNIPAQUE IOHEXOL 300 MG/ML  SOLN COMPARISON:  04/12/2019 FINDINGS: CT CHEST FINDINGS Cardiovascular: Normal heart size. Unchanged small pericardial effusion. Aortic atherosclerosis. Mediastinum/Nodes: Normal appearance of the thyroid gland. The trachea appears patent and is midline. Normal appearance of the esophagus. 9 mm low left paratracheal lymph node is stable, image 19/2. No hilar adenopathy. Lungs/Pleura:  Stable appearance of small partially loculated left pleural effusion and diffuse pleural thickening. Right pleural effusion has resolved. Diffuse miliary nodularity throughout both lungs with interstitial thickening and Peri lymphatic nodularity is again noted compatible with diffuse lymphangitic spread of tumor throughout both lungs. More confluent index nodular lesion within the superior segment of right lower lobe measures 1.7 x 0.9 cm, image 33/4. On the previous examination this measured 2.1 x 1.0 cm. Index confluent nodular density within the superior segment of left lower lobe measures 1.5 x 0.9 cm, image 55/4. Previously 1.5 x 1.0 cm. Index nodular density within the posterior left lower lobe measures 1.1 by 0.8 cm, image 79/4. Previously 1.2 x 0.9 cm. Musculoskeletal: No chest wall mass or suspicious bone lesions identified. CT ABDOMEN PELVIS FINDINGS Hepatobiliary: No focal liver abnormality is seen. No gallstones, gallbladder wall thickening, or biliary dilatation. Pancreas: Unremarkable. No pancreatic ductal dilatation or surrounding inflammatory changes. Spleen: Normal in size without focal abnormality. Adrenals/Urinary Tract: Normal adrenal glands. Normal appearance of the kidneys. No mass or hydronephrosis. Urinary bladder is unremarkable. Stomach/Bowel: Stomach is  within normal limits. Appendix appears normal. No evidence of bowel wall thickening, distention, or inflammatory changes. Vascular/Lymphatic: Aortic atherosclerosis without aneurysm. No abdominal or pelvic adenopathy. Reproductive: Status post hysterectomy. No adnexal masses. Other: Fat containing umbilical hernia. No abdominopelvic ascites. Musculoskeletal: No acute or significant osseous findings. IMPRESSION: 1. Similar appearance of diffuse miliary nodularity throughout both lungs with interstitial thickening and peri lymphatic nodularity compatible with diffuse lymphangitic spread of tumor. Index lesions are not significantly changed in the interval. 2. Stable appearance of small partially loculated left pleural effusion and diffuse pleural thickening. 3. Resolution of right pleural effusion. 4. No evidence for abdominopelvic metastasis. 5. Aortic atherosclerosis. Aortic Atherosclerosis (ICD10-I70.0). Electronically Signed   By: Kerby Moors M.D.   On: 06/21/2019 13:11    Labs:  CBC: Recent Labs    06/16/19 1118 06/23/19 1134 07/01/19 1246 07/04/19 1324  WBC 4.6 5.8 2.9* 5.6  HGB 8.4* 9.6* 9.4* 10.0*  HCT 27.7* 32.1* 30.8* 32.9*  PLT 56* 186 183 196    COAGS: Recent Labs    10/26/18 1133 02/21/19 0547 05/10/19 1038 07/04/19 1324  INR 1.2 1.2 1.1 1.1  APTT 29 24 28   --     BMP: Recent Labs    06/09/19 1136 06/16/19 1118 06/23/19 1134 07/01/19 1246  NA 138 143 141 140  K 4.1 3.7 3.7 4.2  CL 102 105 104 103  CO2 25 27 26 27   GLUCOSE 111* 87 121* 117*  BUN 10 9 12 11   CALCIUM 9.4 9.4 9.6 9.4  CREATININE 0.85 0.81 0.91 0.97  GFRNONAA >60 >60 >60 >60  GFRAA >60 >60 >60 >60    LIVER FUNCTION TESTS: Recent Labs    06/09/19 1136 06/16/19 1118 06/23/19 1134 07/01/19 1246  BILITOT 0.5 <0.2* 0.3 0.4  AST 25 29 27 24   ALT 18 28 18 19   ALKPHOS 80 77 75 78  PROT 6.9 6.8 7.4 6.6  ALBUMIN 3.6 3.6 4.2 3.4*     Assessment and Plan:  54 y.o, female outpatient.  History of NSCL adenocarcinoma bilaterally orginalaly diagnosed in  2018  found to have disease progression. Team is requesting portacath placement for chemotherapy access. Patient has a previous port placed by surgery on 1.25.21 removed on 4.13.21. Patient states that the port was placed on the left side due to occlusion   Pertinent Imaging 5.25.21 -CT Chest  Pertinent IR History 9.17.20 -Thoracentesis left 9.3.20 -  Thoracentesis left  Pertinent Allergies percocet   All labs and medications are within acceptable parameters.  Patient is afebrile.   Risks and benefits of image guided port-a-catheter placement was discussed with the patient including, but not limited to bleeding, infection, pneumothorax, or fibrin sheath development and need for additional procedures.  All of the patient's questions were answered, patient is agreeable to proceed. Consent signed and in chart.   Thank you for this interesting consult.  I greatly enjoyed meeting SHANIYA TASHIRO and look forward to participating in their care.  A copy of this report was sent to the requesting provider on this date.  Electronically Signed: Avel Peace, NP 07/04/2019, 2:20 PM   I spent a total of  40 Minutes   in face to face in clinical consultation, greater than 50% of which was counseling/coordinating care for portacath placement

## 2019-07-04 NOTE — Discharge Instructions (Signed)
Please call Interventional Radiology clinic [phone] with any questions or concerns.  You may remove your dressing and shower tomorrow.  DO NOT use EMLA cream for 2 weeks after port placement as this cream will remove surgical glue on your incision.   Implanted Port Insertion, Care After This sheet gives you information about how to care for yourself after your procedure. Your health care provider may also give you more specific instructions. If you have problems or questions, contact your health care provider. What can I expect after the procedure? After the procedure, it is common to have:  Discomfort at the port insertion site.  Bruising on the skin over the port. This should improve over 3-4 days. Follow these instructions at home: [facility] care  After your port is placed, you will get a manufacturer's information card. The card has information about your port. Keep this card with you at all times.  Take care of the port as told by your health care provider. Ask your health care provider if you or a family member can get training for taking care of the port at home. A home health care nurse may also take care of the port.  Make sure to remember what type of port you have. Incision care      Follow instructions from your health care provider about how to take care of your port insertion site. Make sure you: ? Wash your hands with soap and water before and after you change your bandage (dressing). If soap and water are not available, use hand sanitizer. ? Change your dressing as told by your health care provider. ? Leave stitches (sutures), skin glue, or adhesive strips in place. These skin closures may need to stay in place for 2 weeks or longer. If adhesive strip edges start to loosen and curl up, you may trim the loose edges. Do not remove adhesive strips completely unless your health care provider tells you to do that.  Check your port insertion site every day for signs of  infection. Check for: ? Redness, swelling, or pain. ? Fluid or blood. ? Warmth. ? Pus or a bad smell. Activity  Return to your normal activities as told by your health care provider. Ask your health care provider what activities are safe for you.  Do not lift anything that is heavier than 10 lb (4.5 kg), or the limit that you are told, until your health care provider says that it is safe. General instructions  Take over-the-counter and prescription medicines only as told by your health care provider.  Do not take baths, swim, or use a hot tub until your health care provider approves. Ask your health care provider if you may take showers. You may only be allowed to take sponge baths.  Do not drive for 24 hours if you were given a sedative during your procedure.  Wear a medical alert bracelet in case of an emergency. This will tell any health care providers that you have a port.  Keep all follow-up visits as told by your health care provider. This is important. Contact a health care provider if:  You cannot flush your port with saline as directed, or you cannot draw blood from the port.  You have a fever or chills.  You have redness, swelling, or pain around your port insertion site.  You have fluid or blood coming from your port insertion site.  Your port insertion site feels warm to the touch.  You have pus or a bad  smell coming from the port insertion site. Get help right away if:  You have chest pain or shortness of breath.  You have bleeding from your port that you cannot control. Summary  Take care of the port as told by your health care provider. Keep the manufacturer's information card with you at all times.  Change your dressing as told by your health care provider.  Contact a health care provider if you have a fever or chills or if you have redness, swelling, or pain around your port insertion site.  Keep all follow-up visits as told by your health care  provider. This information is not intended to replace advice given to you by your health care provider. Make sure you discuss any questions you have with your health care provider. Document Revised: 08/11/2017 Document Reviewed: 08/11/2017 Elsevier Patient Education  [place].   Moderate Conscious Sedation, Adult, Care After These instructions provide you with information about caring for yourself after your procedure. Your health care provider may also give you more specific instructions. Your treatment has been planned according to current medical practices, but problems sometimes occur. Call your health care provider if you have any problems or questions after your procedure. What can I expect after the procedure? After your procedure, it is common:  To feel sleepy for several hours.  To feel clumsy and have poor balance for several hours.  To have poor judgment for several hours.  To vomit if you eat too soon. Follow these instructions at home: For at least 24 hours after the procedure:   Do not: ? Participate in activities where you could fall or become injured. ? Drive. ? Use heavy machinery. ? Drink alcohol. ? Take sleeping pills or medicines that cause drowsiness. ? Make important decisions or sign legal documents. ? Take care of children on your own.  Rest. Eating and drinking  Follow the diet recommended by your health care provider.  If you vomit: ? Drink water, juice, or soup when you can drink without vomiting. ? Make sure you have little or no nausea before eating solid foods. General instructions  Have a responsible adult stay with you until you are awake and alert.  Take over-the-counter and prescription medicines only as told by your health care provider.  If you smoke, do not smoke without supervision.  Keep all follow-up visits as told by your health care provider. This is important. Contact a health care provider if:  You keep feeling  nauseous or you keep vomiting.  You feel light-headed.  You develop a rash.  You have a fever. Get help right away if:  You have trouble breathing. This information is not intended to replace advice given to you by your health care provider. Make sure you discuss any questions you have with your health care provider. Document Revised: 12/26/2016 Document Reviewed: 05/05/2015 Elsevier Patient Education  2020 [employer].

## 2019-07-04 NOTE — Procedures (Signed)
Interventional Radiology Procedure Note  Procedure: Placement of a right IJ approach single lumen PowerPort.  Tip is positioned at the superior cavoatrial junction and catheter is ready for immediate use.  Complications: No immediate Recommendations:  - Ok to shower tomorrow - Do not submerge for 7 days - Routine line care   Signed,  Zaley Talley K. Miski Feldpausch, MD   

## 2019-07-05 ENCOUNTER — Telehealth: Payer: Self-pay | Admitting: Medical Oncology

## 2019-07-05 NOTE — Telephone Encounter (Signed)
New PORT and using Emla cream- Pt said she was told in radiology not to use EMLA cream over the port site  for 2 weeks because " it breaks down the glue".  Her next treatment is in 11 days . I told her we can use ice to numb it and I will also verify with Dr Laurence Ferrari re: using EMLA.

## 2019-07-05 NOTE — Telephone Encounter (Signed)
Pt had weekly CBC yesterday for port placement. CMP was done 2 weeks ago  -only abnormal was Glucose of 117 and Albumin 3.4   Asking if she can skip weekly CMP  on Thursday .   I told pt she can skip CMP  on thursday .

## 2019-07-07 ENCOUNTER — Inpatient Hospital Stay: Payer: Medicare Other

## 2019-07-08 NOTE — Telephone Encounter (Signed)
Patient is approved for Tagrisso at no charge from [employer] 07/08/19-01/27/20  [place] [facility] [place] Phone [phone] Fax [phone] 07/08/2019 9:47 AM

## 2019-07-11 ENCOUNTER — Other Ambulatory Visit: Payer: Self-pay | Admitting: Internal Medicine

## 2019-07-11 MED FILL — PANTOPRAZOLE SOD DR 40 MG T: 40 | 30 days supply | Qty: 30 | Fill #9

## 2019-07-11 MED FILL — DEXAMETHASONE 4 MG TABLET: 4 | 21 days supply | Qty: 6 | Fill #6

## 2019-07-11 MED FILL — FOLIC ACID 1 MG TABS: 1 | 30 days supply | Qty: 30 | Fill #0

## 2019-07-13 ENCOUNTER — Telehealth: Payer: Self-pay | Admitting: Medical Oncology

## 2019-07-13 ENCOUNTER — Other Ambulatory Visit: Payer: Self-pay | Admitting: Internal Medicine

## 2019-07-13 DIAGNOSIS — C3492 Malignant neoplasm of unspecified part of left bronchus or lung: Secondary | ICD-10-CM

## 2019-07-13 NOTE — Telephone Encounter (Signed)
Emla cream and new port Per Dr Vernard Gambles, pt should not apply EMLA cream over dermabond. The dermabond will come off or fall off naturally.  The pt can apply EMLA cream over the port skin that is over the port but below dermabond. Pt instructed on this technique.

## 2019-07-14 ENCOUNTER — Inpatient Hospital Stay: Payer: Medicare Other

## 2019-07-14 ENCOUNTER — Encounter: Payer: Self-pay | Admitting: Internal Medicine

## 2019-07-14 ENCOUNTER — Inpatient Hospital Stay: Payer: Medicare Other | Admitting: Nutrition

## 2019-07-14 ENCOUNTER — Other Ambulatory Visit: Payer: Self-pay

## 2019-07-14 ENCOUNTER — Inpatient Hospital Stay (HOSPITAL_BASED_OUTPATIENT_CLINIC_OR_DEPARTMENT_OTHER): Payer: Medicare Other | Admitting: Internal Medicine

## 2019-07-14 VITALS — BP 123/67 | HR 92 | Temp 97.3°F | Resp 18 | Ht 66.0 in | Wt 196.7 lb

## 2019-07-14 VITALS — BP 129/79 | HR 93 | Temp 98.3°F | Resp 18

## 2019-07-14 DIAGNOSIS — Z5111 Encounter for antineoplastic chemotherapy: Secondary | ICD-10-CM | POA: Diagnosis not present

## 2019-07-14 DIAGNOSIS — C3492 Malignant neoplasm of unspecified part of left bronchus or lung: Secondary | ICD-10-CM

## 2019-07-14 DIAGNOSIS — Z5112 Encounter for antineoplastic immunotherapy: Secondary | ICD-10-CM | POA: Diagnosis not present

## 2019-07-14 DIAGNOSIS — C7931 Secondary malignant neoplasm of brain: Secondary | ICD-10-CM

## 2019-07-14 DIAGNOSIS — I10 Essential (primary) hypertension: Secondary | ICD-10-CM | POA: Diagnosis not present

## 2019-07-14 DIAGNOSIS — C3490 Malignant neoplasm of unspecified part of unspecified bronchus or lung: Secondary | ICD-10-CM

## 2019-07-14 DIAGNOSIS — Z95828 Presence of other vascular implants and grafts: Secondary | ICD-10-CM

## 2019-07-14 LAB — CBC WITH DIFFERENTIAL (CANCER CENTER ONLY)
Abs Immature Granulocytes: 0.06 10*3/uL (ref 0.00–0.07)
Basophils Absolute: 0 10*3/uL (ref 0.0–0.1)
Basophils Relative: 0 %
Eosinophils Absolute: 0 10*3/uL (ref 0.0–0.5)
Eosinophils Relative: 0 %
HCT: 32 % — ABNORMAL LOW (ref 36.0–46.0)
Hemoglobin: 9.7 g/dL — ABNORMAL LOW (ref 12.0–15.0)
Immature Granulocytes: 1 %
Lymphocytes Relative: 13 %
Lymphs Abs: 1.5 10*3/uL (ref 0.7–4.0)
MCH: 31.8 pg (ref 26.0–34.0)
MCHC: 30.3 g/dL (ref 30.0–36.0)
MCV: 104.9 fL — ABNORMAL HIGH (ref 80.0–100.0)
Monocytes Absolute: 1.6 10*3/uL — ABNORMAL HIGH (ref 0.1–1.0)
Monocytes Relative: 14 %
Neutro Abs: 7.9 10*3/uL — ABNORMAL HIGH (ref 1.7–7.7)
Neutrophils Relative %: 72 %
Platelet Count: 298 10*3/uL (ref 150–400)
RBC: 3.05 MIL/uL — ABNORMAL LOW (ref 3.87–5.11)
RDW: 19.2 % — ABNORMAL HIGH (ref 11.5–15.5)
WBC Count: 11.1 10*3/uL — ABNORMAL HIGH (ref 4.0–10.5)
nRBC: 0 % (ref 0.0–0.2)

## 2019-07-14 LAB — CMP (CANCER CENTER ONLY)
ALT: 26 U/L (ref 0–44)
AST: 31 U/L (ref 15–41)
Albumin: 3.3 g/dL — ABNORMAL LOW (ref 3.5–5.0)
Alkaline Phosphatase: 92 U/L (ref 38–126)
Anion gap: 12 (ref 5–15)
BUN: 12 mg/dL (ref 6–20)
CO2: 24 mmol/L (ref 22–32)
Calcium: 9.6 mg/dL (ref 8.9–10.3)
Chloride: 106 mmol/L (ref 98–111)
Creatinine: 0.92 mg/dL (ref 0.44–1.00)
GFR, Est AFR Am: >60 mL/min (ref >60)
GFR, Estimated: >60 mL/min (ref >60)
Glucose, Bld: 122 mg/dL — ABNORMAL HIGH (ref 70–99)
Potassium: 4.1 mmol/L (ref 3.5–5.1)
Sodium: 142 mmol/L (ref 135–145)
Total Bilirubin: 0.2 mg/dL — ABNORMAL LOW (ref 0.3–1.2)
Total Protein: 7 g/dL (ref 6.5–8.1)

## 2019-07-14 LAB — TOTAL PROTEIN, URINE DIPSTICK: Protein, ur: 30 mg/dL — AB

## 2019-07-14 MED ORDER — SODIUM CHLORIDE 0.9 % IV SOLN
15.0000 mg/kg | Freq: Once | INTRAVENOUS | Status: AC
  Administered 2019-07-14: 1300 mg via INTRAVENOUS
  Filled 2019-07-14: qty 48

## 2019-07-14 MED ORDER — SODIUM CHLORIDE 0.9% FLUSH
10.0000 mL | INTRAVENOUS | Status: DC | PRN
  Administered 2019-07-14: 10 mL
  Filled 2019-07-14: qty 10

## 2019-07-14 MED ORDER — SODIUM CHLORIDE 0.9 % IV SOLN
400.0000 mg/m2 | Freq: Once | INTRAVENOUS | Status: AC
  Administered 2019-07-14: 800 mg via INTRAVENOUS
  Filled 2019-07-14: qty 12

## 2019-07-14 MED ORDER — HEPARIN SOD (PORK) LOCK FLUSH 100 UNIT/ML IV SOLN
500.0000 [IU] | Freq: Once | INTRAVENOUS | Status: AC | PRN
  Administered 2019-07-14: 500 [IU]
  Filled 2019-07-14: qty 5

## 2019-07-14 MED ORDER — PROCHLORPERAZINE MALEATE 10 MG PO TABS
ORAL_TABLET | ORAL | Status: AC
  Filled 2019-07-14: qty 1

## 2019-07-14 MED ORDER — PROCHLORPERAZINE MALEATE 10 MG PO TABS
10.0000 mg | ORAL_TABLET | Freq: Once | ORAL | Status: AC
  Administered 2019-07-14: 10 mg via ORAL

## 2019-07-14 MED ORDER — SODIUM CHLORIDE 0.9 % IV SOLN
Freq: Once | INTRAVENOUS | Status: AC
  Filled 2019-07-14: qty 250

## 2019-07-14 NOTE — Patient Instructions (Signed)

## 2019-07-14 NOTE — Progress Notes (Signed)
[place] [phone]   [phone]  OFFICE PROGRESS NOTE  Copland, Gay Filler, MD [address] [place] [address]  DIAGNOSIS: DIAGNOSIS: stage IV (T3, N2, M1 a) non-small cell lung cancer, poorly differentiated adenocarcinoma with extensive miliary distribution in the lungs bilaterally diagnosed in September 2018. 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 NEGATIVE for the Exon 20 T790M mutation. Repeat molecular studies by guardant 360 at time of progression showed ATM [id number] [facility], V600E  PRIOR THERAPY:  1) Gilotrif 40 mg daily started 11/08/2016.Status post 13 months of treatment. 2) Tagrisso 80 mg p.o. daily.  First dose started January 01, 2018.  Status post 18 months of treatment.   CURRENT THERAPY: 1) systemic chemotherapy with carboplatin for AUC 5, Alimta 500 mg/M2 and Avastin 15 mg/KG every 3 weeks.  First dose February 10, 2018.  Status post 7 cycles.  Starting from cycle #7 she will be on maintenance treatment with Alimta and Avastin in addition to her treatment with Tagrisso.  INTERVAL HISTORY: [name] 54y.o. female returns to the clinic today for follow-up visit.  The patient is feeling fine today with no concerning complaints.  She had a new Port-A-Cath placed on the right side of her chest.  She denied having any chest pain, shortness of breath, cough or hemoptysis.  She denied having any fever or chills.  She has no nausea, vomiting, diarrhea or constipation.  She denied having any headache or visual changes.  She is here today for evaluation before starting cycle #8 of her treatment.   MEDICAL HISTORY: Past Medical History:  Diagnosis Date  . Adenocarcinoma of left lung, stage 4 ([facility] 10/28/2016  . Anemia   . Arthritis   . Constipation   . [facility]    in [facility] 35 years  +  . Dyspnea    gets "winded"  . GERD (gastroesophageal reflux disease)   . Goals of care, counseling/discussion 10/28/2016  . History of hiatal hernia    noted on CT 12/20  . Hypertension   . Pneumonia    walking pneumonia in the late 80's  . Recurrent pleural effusion on left     ALLERGIES:  is allergic to percocet [oxycodone-acetaminophen], lisinopril, and losartan.  MEDICATIONS:  Current Outpatient Medications  Medication Sig Dispense Refill  . albuterol (VENTOLIN HFA) 108 (90 Base) MCG/ACT inhaler Inhale 2 puffs into the lungs every 6 (six) hours as needed for wheezing or shortness of breath. 8 g 1  . clindamycin (CLINDAGEL) 1 % gel Apply 1 application topically 2 (two) times daily. 30 g 1  . dexamethasone (DECADRON) 4 MG tablet 1 tablet p.o. twice daily the day before, day of and day after chemotherapy every 3 weeks (Patient taking differently: Take 4 mg by mouth See admin instructions. 4 mg  twice daily the day before, day of and day after chemotherapy every 3 weeks) 20 tablet 2  . fluticasone (FLONASE) 50 MCG/ACT nasal spray Place 2 sprays into both nostrils daily as needed for allergies. 16 g 3  . folic acid (FOLVITE) 1 MG tablet TAKE 1 TABLET BY MOUTH ONCE A DAY 30 tablet 4  . furosemide (LASIX) 40 MG tablet TAKE 1 TABLET BY MOUTH ONCE DAILY (Patient taking differently: Take 40 mg by mouth daily. ) 90 tablet 1  . guaiFENesin (MUCINEX) 600 MG 12  hr tablet Take 600 mg by mouth 2 (two) times daily as needed for to loosen phlegm.     Marland Kitchen HYDROcodone-homatropine (HYCODAN) 5-1.5 MG/5ML syrup Take 5 mLs by mouth every 6 (six) hours as needed for cough. 240 mL 0  . lidocaine-prilocaine (EMLA) cream APPLY TO THE AFFECTED AREA(S) AS NEEDED (Patient taking differently: Apply 1 application topically as needed [facility]). ) 30 g 0  . linaclotide (LINZESS) 145 MCG CAPS capsule Take 1 capsule (145 mcg total) by mouth daily before breakfast. 30 capsule 6  . loperamide (IMODIUM) 1 MG/5ML solution Take  10 mLs (2 mg total) by mouth as needed for diarrhea or loose stools. (Patient taking differently: Take 2 mg by mouth daily as needed for diarrhea or loose stools. ) 120 mL 0  . loratadine (CLARITIN) 10 MG tablet Take 10 mg by mouth daily.    . magnesium oxide (MAG-OX) 400 (241.3 Mg) MG tablet Take 1 tablet (400 mg total) by mouth daily.    . methocarbamol (ROBAXIN) 500 MG tablet TAKE 1 TABLET (500 MG TOTAL) BY MOUTH EVERY 8 (EIGHT) HOURS AS NEEDED FOR MUSCLE SPASMS. 30 tablet 5  . nystatin cream (MYCOSTATIN) APPLY 1 APPLICATION TOPICALLY 2 (TWO) TIMES DAILY. (Patient taking differently: Apply 1 application topically daily as needed (skin irritaion). ) 30 g 0  . ondansetron (ZOFRAN) 8 MG tablet TAKE 1 TABLET BY MOUTH EVERY 8 HOURS AS NEEDED FOR NAUSEA OR VOMITING (Patient taking differently: Take 8 mg by mouth every 8 (eight) hours as needed for nausea or vomiting. ) 30 tablet 2  . osimertinib mesylate (TAGRISSO) 80 MG tablet Take 1 tablet (80 mg total) by mouth daily. 30 tablet 2  . Oxycodone HCl 10 MG TABS Take 1 tablet (10 mg total) by mouth 3 (three) times daily as needed. (Patient taking differently: Take 10 mg by mouth 2 (two) times daily as needed (Pain). ) 30 tablet 0  . pantoprazole (PROTONIX) 40 MG tablet TAKE 1 TABLET (40 MG TOTAL) BY MOUTH DAILY AT 12 NOON. (Patient taking differently: Take 40 mg by mouth daily. ) 90 tablet 3  . potassium chloride SA (K-DUR) 20 MEQ tablet TAKE 1 TABLET (20 MEQ TOTAL) BY MOUTH 2 TIMES DAILY. (Patient taking differently: Take 20 mEq by mouth 2 (two) times daily. ) 180 tablet 3  . prochlorperazine (COMPAZINE) 10 MG tablet Take 1 tablet (10 mg total) by mouth every 6 (six) hours as needed for nausea or vomiting. 30 tablet 2  . senna-docusate (SENOKOT-S) 8.6-50 MG tablet Take 1 tablet by mouth at bedtime.    . traMADol (ULTRAM) 50 MG tablet Take 1 tablet (50 mg total) by mouth every 6 (six) hours as needed. 40 tablet 0   No current facility-administered  medications for this visit.    SURGICAL HISTORY:  Past Surgical History:  Procedure Laterality Date  . ABDOMINAL HYSTERECTOMY     partial hysterectomy  . BREAST EXCISIONAL BIOPSY Left 20+ yrs ago   benign  . CHEST TUBE INSERTION Left 10/26/2018   Procedure: INSERTION PLEURAL DRAINAGE CATHETER;  Surgeon: Ivin Poot, MD;  Location: [facility];  Service: Thoracic;  Laterality: Left;  . COLONOSCOPY    . IR IMAGING GUIDED PORT INSERTION  07/04/2019  . PERICARDIAL WINDOW N/A 01/10/2017   Procedure: PERICARDIAL WINDOW- SUB XYPHOID, RIGHT CHEST TUBE;  Surgeon: Ivin Poot, MD;  Location: [facility];  Service: Thoracic;  Laterality: N/A;  . PORT-A-CATH REMOVAL Left 05/10/2019   Procedure: REMOVAL PORT-A-CATH;  Surgeon: Lucianne Lei  Donney Rankins, MD;  Location: [facility];  Service: Thoracic;  Laterality: Left;  . PORTACATH PLACEMENT Left 02/21/2019   Procedure: INSERTION PORT-A-CATH;  Surgeon: Ivin Poot, MD;  Location: [facility];  Service: Thoracic;  Laterality: Left;  . REMOVAL OF PLEURAL DRAINAGE CATHETER Left 02/21/2019   Procedure: REMOVAL OF PLEURAL DRAINAGE CATHETER;  Surgeon: Ivin Poot, MD;  Location: [facility];  Service: Thoracic;  Laterality: Left;  Marland Kitchen VIDEO BRONCHOSCOPY Bilateral 10/21/2016   Procedure: VIDEO BRONCHOSCOPY WITH FLUORO;  Surgeon: Tanda Rockers, MD;  Location: [facility];  Service: Cardiopulmonary;  Laterality: Bilateral;    REVIEW OF SYSTEMS:  A comprehensive review of systems was negative except for: Constitutional: positive for fatigue   PHYSICAL EXAMINATION: General appearance: alert, cooperative, fatigued and no distress Head: Normocephalic, without obvious abnormality, atraumatic Neck: no adenopathy, no JVD, supple, symmetrical, trachea midline and thyroid not enlarged, symmetric, no tenderness/mass/nodules Lymph nodes: Cervical, supraclavicular, and axillary nodes normal. Resp: clear to auscultation bilaterally Back: symmetric, no curvature. ROM normal. No CVA  tenderness. Cardio: regular rate and rhythm, S1, S2 normal, no murmur, click, rub or gallop GI: soft, non-tender; bowel sounds normal; no masses,  no organomegaly Extremities: extremities normal, atraumatic, no cyanosis or edema  ECOG PERFORMANCE STATUS: 1 - Symptomatic but completely ambulatory  Blood pressure 123/67, pulse 92, temperature (!) 97.3 F (36.3 C), temperature source Temporal, resp. rate 18, height 5' 6"  (1.676 m), weight 196 lb 11.2 oz (89.2 kg), last menstrual period 03/15/2010, SpO2 98 %.  LABORATORY DATA: Lab Results  Component Value Date   WBC 11.1 (H) 07/14/2019   HGB 9.7 (L) 07/14/2019   HCT 32.0 (L) 07/14/2019   MCV 104.9 (H) 07/14/2019   PLT 298 07/14/2019      Chemistry      Component Value Date/Time   NA 140 07/01/2019 1246   NA 135 (L) 01/08/2017 1055   K 4.2 07/01/2019 1246   K 3.2 (L) 01/08/2017 1055   CL 103 07/01/2019 1246   CO2 27 07/01/2019 1246   CO2 25 01/08/2017 1055   BUN 11 07/01/2019 1246   BUN 14.6 01/08/2017 1055   CREATININE 0.97 07/01/2019 1246   CREATININE 1.3 (H) 01/08/2017 1055      Component Value Date/Time   CALCIUM 9.4 07/01/2019 1246   CALCIUM 9.5 01/08/2017 1055   ALKPHOS 78 07/01/2019 1246   ALKPHOS 82 01/08/2017 1055   AST 24 07/01/2019 1246   AST 31 01/08/2017 1055   ALT 19 07/01/2019 1246   ALT 34 01/08/2017 1055   BILITOT 0.4 07/01/2019 1246   BILITOT 1.74 (H) 01/08/2017 1055       RADIOGRAPHIC STUDIES: CT Chest W Contrast  Result Date: 06/21/2019 CLINICAL DATA:  Lung cancer restaging. EXAM: CT CHEST, ABDOMEN, AND PELVIS WITH CONTRAST TECHNIQUE: Multidetector CT imaging of the chest, abdomen and pelvis was performed following the standard protocol during bolus administration of intravenous contrast. CONTRAST:  130m OMNIPAQUE IOHEXOL 300 MG/ML  SOLN COMPARISON:  04/12/2019 FINDINGS: CT CHEST FINDINGS Cardiovascular: Normal heart size. Unchanged small pericardial effusion. Aortic atherosclerosis.  Mediastinum/Nodes: Normal appearance of the thyroid gland. The trachea appears patent and is midline. Normal appearance of the esophagus. 9 mm low left paratracheal lymph node is stable, image 19/2. No hilar adenopathy. Lungs/Pleura: Stable appearance of small partially loculated left pleural effusion and diffuse pleural thickening. Right pleural effusion has resolved. Diffuse miliary nodularity throughout both lungs with interstitial thickening and Peri lymphatic nodularity is again noted compatible with diffuse lymphangitic  spread of tumor throughout both lungs. More confluent index nodular lesion within the superior segment of right lower lobe measures 1.7 x 0.9 cm, image 33/4. On the previous examination this measured 2.1 x 1.0 cm. Index confluent nodular density within the superior segment of left lower lobe measures 1.5 x 0.9 cm, image 55/4. Previously 1.5 x 1.0 cm. Index nodular density within the posterior left lower lobe measures 1.1 by 0.8 cm, image 79/4. Previously 1.2 x 0.9 cm. Musculoskeletal: No chest wall mass or suspicious bone lesions identified. CT ABDOMEN PELVIS FINDINGS Hepatobiliary: No focal liver abnormality is seen. No gallstones, gallbladder wall thickening, or biliary dilatation. Pancreas: Unremarkable. No pancreatic ductal dilatation or surrounding inflammatory changes. Spleen: Normal in size without focal abnormality. Adrenals/Urinary Tract: Normal adrenal glands. Normal appearance of the kidneys. No mass or hydronephrosis. Urinary bladder is unremarkable. Stomach/Bowel: Stomach is within normal limits. Appendix appears normal. No evidence of bowel wall thickening, distention, or inflammatory changes. Vascular/Lymphatic: Aortic atherosclerosis without aneurysm. No abdominal or pelvic adenopathy. Reproductive: Status post hysterectomy. No adnexal masses. Other: Fat containing umbilical hernia. No abdominopelvic ascites. Musculoskeletal: No acute or significant osseous findings. IMPRESSION:  1. Similar appearance of diffuse miliary nodularity throughout both lungs with interstitial thickening and peri lymphatic nodularity compatible with diffuse lymphangitic spread of tumor. Index lesions are not significantly changed in the interval. 2. Stable appearance of small partially loculated left pleural effusion and diffuse pleural thickening. 3. Resolution of right pleural effusion. 4. No evidence for abdominopelvic metastasis. 5. Aortic atherosclerosis. Aortic Atherosclerosis (ICD10-I70.0). Electronically Signed   By: Kerby Moors M.D.   On: 06/21/2019 13:11   MR Brain W Wo Contrast  Result Date: 06/23/2019 CLINICAL DATA:  Restaging of left lung adenocarcinoma. History of brain metastases. EXAM: MRI HEAD WITHOUT AND WITH CONTRAST TECHNIQUE: Multiplanar, multiecho pulse sequences of the brain and surrounding structures were obtained without and with intravenous contrast. CONTRAST:  62m GADAVIST GADOBUTROL 1 MMOL/ML IV SOLN COMPARISON:  Brain MRI 04/14/2019 FINDINGS: Brain: No acute infarct, acute hemorrhage or extra-axial collection. Normal white matter signal. Normal volume of CSF spaces. Numerous foci of chronic hemosiderin deposition throughout both hemispheres, corresponding to previously treated metastases and unchanged from the prior study. Normal midline structures. Vascular: Normal flow voids. Skull and upper cervical spine: Normal marrow signal. Sinuses/Orbits: Negative. Other: None. IMPRESSION: 1. No intracranial metastatic disease. 2. Numerous foci of chronic hemosiderin deposition throughout both hemispheres, corresponding to previously treated metastases. 3. No acute abnormality. Electronically Signed   By: KUlyses JarredM.D.   On: 06/23/2019 00:06   CT Abdomen Pelvis W Contrast  Result Date: 06/21/2019 CLINICAL DATA:  Lung cancer restaging. EXAM: CT CHEST, ABDOMEN, AND PELVIS WITH CONTRAST TECHNIQUE: Multidetector CT imaging of the chest, abdomen and pelvis was performed following the  standard protocol during bolus administration of intravenous contrast. CONTRAST:  1052mOMNIPAQUE IOHEXOL 300 MG/ML  SOLN COMPARISON:  04/12/2019 FINDINGS: CT CHEST FINDINGS Cardiovascular: Normal heart size. Unchanged small pericardial effusion. Aortic atherosclerosis. Mediastinum/Nodes: Normal appearance of the thyroid gland. The trachea appears patent and is midline. Normal appearance of the esophagus. 9 mm low left paratracheal lymph node is stable, image 19/2. No hilar adenopathy. Lungs/Pleura: Stable appearance of small partially loculated left pleural effusion and diffuse pleural thickening. Right pleural effusion has resolved. Diffuse miliary nodularity throughout both lungs with interstitial thickening and Peri lymphatic nodularity is again noted compatible with diffuse lymphangitic spread of tumor throughout both lungs. More confluent index nodular lesion within the superior segment of right lower  lobe measures 1.7 x 0.9 cm, image 33/4. On the previous examination this measured 2.1 x 1.0 cm. Index confluent nodular density within the superior segment of left lower lobe measures 1.5 x 0.9 cm, image 55/4. Previously 1.5 x 1.0 cm. Index nodular density within the posterior left lower lobe measures 1.1 by 0.8 cm, image 79/4. Previously 1.2 x 0.9 cm. Musculoskeletal: No chest wall mass or suspicious bone lesions identified. CT ABDOMEN PELVIS FINDINGS Hepatobiliary: No focal liver abnormality is seen. No gallstones, gallbladder wall thickening, or biliary dilatation. Pancreas: Unremarkable. No pancreatic ductal dilatation or surrounding inflammatory changes. Spleen: Normal in size without focal abnormality. Adrenals/Urinary Tract: Normal adrenal glands. Normal appearance of the kidneys. No mass or hydronephrosis. Urinary bladder is unremarkable. Stomach/Bowel: Stomach is within normal limits. Appendix appears normal. No evidence of bowel wall thickening, distention, or inflammatory changes. Vascular/Lymphatic:  Aortic atherosclerosis without aneurysm. No abdominal or pelvic adenopathy. Reproductive: Status post hysterectomy. No adnexal masses. Other: Fat containing umbilical hernia. No abdominopelvic ascites. Musculoskeletal: No acute or significant osseous findings. IMPRESSION: 1. Similar appearance of diffuse miliary nodularity throughout both lungs with interstitial thickening and peri lymphatic nodularity compatible with diffuse lymphangitic spread of tumor. Index lesions are not significantly changed in the interval. 2. Stable appearance of small partially loculated left pleural effusion and diffuse pleural thickening. 3. Resolution of right pleural effusion. 4. No evidence for abdominopelvic metastasis. 5. Aortic atherosclerosis. Aortic Atherosclerosis (ICD10-I70.0). Electronically Signed   By: Kerby Moors M.D.   On: 06/21/2019 13:11   IR IMAGING GUIDED PORT INSERTION  Result Date: 07/05/2019 INDICATION: 54 year old female with stage IV adenocarcinoma of the left lung. She presents for port catheter placement for durable venous access. EXAM: IMPLANTED PORT A CATH PLACEMENT WITH ULTRASOUND AND FLUOROSCOPIC GUIDANCE MEDICATIONS: 2 g Ancef; The antibiotic was administered within an appropriate time interval prior to skin puncture. ANESTHESIA/SEDATION: Versed 6 mg IV; Fentanyl 200 mcg IV; Moderate Sedation Time:  32 minutes The patient was continuously monitored during the procedure by the interventional radiology nurse under my direct supervision. FLUOROSCOPY TIME:  0 minutes, 24 seconds (3 mGy) COMPLICATIONS: None immediate. PROCEDURE: The right neck and chest was prepped with chlorhexidine, and draped in the usual sterile fashion using maximum barrier technique (cap and mask, sterile gown, sterile gloves, large sterile sheet, hand hygiene and cutaneous antiseptic). Local anesthesia was attained by infiltration with 1% lidocaine with epinephrine. Ultrasound demonstrated patency of the right internal jugular vein,  and this was documented with an image. Under real-time ultrasound guidance, this vein was accessed with a 21 gauge micropuncture needle and image documentation was performed. A small dermatotomy was made at the access site with an 11 scalpel. A 0.018" wire was advanced into the SVC and the access needle exchanged for a 23F micropuncture vascular sheath. The 0.018" wire was then removed and a 0.035" wire advanced into the IVC. An appropriate location for the subcutaneous reservoir was selected below the clavicle and an incision was made through the skin and underlying soft tissues. The subcutaneous tissues were then dissected using a combination of blunt and sharp surgical technique and a pocket was formed. A single lumen power injectable portacatheter was then tunneled through the subcutaneous tissues from the pocket to the dermatotomy and the port reservoir placed within the subcutaneous pocket. The venous access site was then serially dilated and a peel away vascular sheath placed over the wire. The wire was removed and the port catheter advanced into position under fluoroscopic guidance. The catheter tip is  positioned in the superior cavoatrial junction. This was documented with a spot image. The portacatheter was then tested and found to flush and aspirate well. The port was flushed with saline followed by 100 units/mL heparinized saline. The pocket was then closed in two layers using first subdermal inverted interrupted absorbable sutures followed by a running subcuticular suture. The epidermis was then sealed with Dermabond. The dermatotomy at the venous access site was also closed with Dermabond. IMPRESSION: Successful placement of a right IJ approach Power Port with ultrasound and fluoroscopic guidance. The catheter is ready for use. Electronically Signed   By: Jacqulynn Cadet M.D.   On: 07/05/2019 11:00    ASSESSMENT AND PLAN: This is a very pleasant 54 years old African-American female with metastatic  non-small cell lung cancer, adenocarcinoma and positive for EGFR mutation with deletion in exon 19.  The patient is currently on treatment with GILOTRIF 40 mg p.o. daily status post 13 months.  The patient is tolerating this treatment well except for few episodes of diarrhea and mild skin rash. She is complaining of dizzy spells and vertigo recently. Repeat CT scan of the chest, abdomen and pelvis performed recently showed some evidence for disease progression with increase in size and number of pulmonary nodules. Her treatment was switched to Tagrisso 80 mg p.o. daily started January 01, 2018.  She is status post 12 months of treatment The patient has been tolerating this treatment well with no concerning adverse effects. She had repeat MRI of the brain as well as CT scan of the chest, abdomen pelvis performed recently. Her MRI of the brain showed no concerning findings for disease progression in the brain but CT scan of the chest showed some evidence for progression with lymphangitic spread of the disease. She was referred to Dr. Durenda Hurt at [facility] and repeat molecular studies showed the emergence of BRAF mutation V600E. I recommended for the patient to consider systemic chemotherapy and she is here today for evaluation and discussion of this option.  I had a lengthy discussion with the patient today regarding her condition.  She understands that a combination of BRAF inhibitor and Tagrisso has not been well studied at this point. She is currently undergoing systemic chemotherapy with carboplatin for AUC of 5, Alimta 500 mg/M2 and Avastin 15 mg/KG every 3 weeks, status post 7 cycles.  She also continues her current treatment with Tagrisso 80 mg p.o. daily. She continues to tolerate her treatment well with no concerning adverse effects. I recommended for the patient to proceed with cycle #8 of her treatment today as planned. I will see her back for follow-up visit in 3 weeks for evaluation  before starting the next cycle of her treatment. The patient was advised to call immediately if she has any concerning symptoms in the interval. The patient voices understanding of current disease status and treatment options and is in agreement with the current care plan. All questions were answered. The patient knows to call the clinic with any problems, questions or concerns. We can certainly see the patient much sooner if necessary.  Disclaimer: This note was dictated with voice recognition software. Similar sounding words can inadvertently be transcribed and may not be corrected upon review.

## 2019-07-14 NOTE — Patient Instructions (Signed)
[facility] Discharge Instructions for Patients Receiving Chemotherapy  Today you received the following immunotherapy agent: Bevacizumab and chemotherapy agent: Pemetrexed  To help prevent nausea and vomiting after your treatment, we encourage you to take your nausea medication as directed by your MD.   If you develop nausea and vomiting that is not controlled by your nausea medication, call the clinic.   BELOW ARE SYMPTOMS THAT SHOULD BE REPORTED IMMEDIATELY:  *FEVER GREATER THAN 100.5 F  *CHILLS WITH OR WITHOUT FEVER  NAUSEA AND VOMITING THAT IS NOT CONTROLLED WITH YOUR NAUSEA MEDICATION  *UNUSUAL SHORTNESS OF BREATH  *UNUSUAL BRUISING OR BLEEDING  TENDERNESS IN MOUTH AND THROAT WITH OR WITHOUT PRESENCE OF ULCERS  *URINARY PROBLEMS  *BOWEL PROBLEMS  UNUSUAL RASH Items with * indicate a potential emergency and should be followed up as soon as possible.  Feel free to call the clinic should you have any questions or concerns. The clinic phone number is [phone].  Please show the [facility] at check-in to the Emergency Department and triage nurse.

## 2019-07-14 NOTE — Progress Notes (Signed)
Nutrition follow-up completed with patient receiving infusion for metastatic non-small cell lung cancer. Weight has improved and was documented as 196.7 pounds on June 17. Patient has nausea occasionally and tries to take nausea medicine. She is drinking oral nutrition supplements 1-2 bottles daily.  Nutrition diagnosis: Unintended weight loss improved.  Intervention: Patient to continue increased calories and protein in small amounts throughout the day. Continue oral nutrition supplements 1-2 bottles daily. Patient has contact information.  Monitoring, evaluation, goals: Patient will tolerate increased calories and protein to minimize weight loss.  Next visit: To be scheduled as needed.  Please reconsult RD if nutrition needs are identified.  **Disclaimer: This note was dictated with voice recognition software. Similar sounding words can inadvertently be transcribed and this note may contain transcription errors which may not have been corrected upon publication of note.**

## 2019-07-19 ENCOUNTER — Other Ambulatory Visit: Payer: Self-pay | Admitting: Physician Assistant

## 2019-07-19 DIAGNOSIS — C3492 Malignant neoplasm of unspecified part of left bronchus or lung: Secondary | ICD-10-CM

## 2019-07-19 DIAGNOSIS — R52 Pain, unspecified: Secondary | ICD-10-CM

## 2019-07-19 MED ORDER — OXYCODONE HCL 5 MG PO TABS: 5.0000 mg | ORAL_TABLET | Freq: Four times a day (QID) | ORAL | 0 refills | Status: DC | PRN

## 2019-07-19 MED FILL — oxyCODONE HCL 5 MG TABS: 5 | 7 days supply | Qty: 30 | Fill #0

## 2019-07-21 ENCOUNTER — Telehealth: Payer: Self-pay | Admitting: *Deleted

## 2019-07-21 NOTE — Telephone Encounter (Signed)
Received vm call from pt asking about appts & if OK to have a glass of wine.  Informed occ glass of wine should be OK but maybe not on treatment day & not to mix with other sedating meds.  Next appt 7/28 but looks like she needs to be here before then.  Orders for every 3 wk treatments. Message to Dr Julien Nordmann to verify.  She states she usually comes in for labs more often.

## 2019-07-25 MED FILL — TAGRISSO 80 MG TABLET: 80 | 30 days supply | Qty: 30 | Fill #0

## 2019-07-27 MED FILL — DEXAMETHASONE 4 MG TABLET: 4 | 21 days supply | Qty: 6 | Fill #7

## 2019-08-03 NOTE — Progress Notes (Signed)
[facility] OFFICE PROGRESS NOTE  Copland, Gay Filler, MD [address] [place] [address]  DIAGNOSIS:  Stage IV (T3, N2, M1 a) non-small cell lung cancer, poorly differentiated adenocarcinoma with extensive miliary distribution in the lungs bilaterally diagnosed in September 2018. POSITIVE for an Exon 19 deletion mutation.NEGATIVE for the Exon 20 T790M mutation. Repeat molecular studies byguardant 360at time of progression showed ATM [id number] [facility] B[facility] mutation, V600E  PRIOR THERAPY:  1) Gilotrif 40 mg daily started 11/08/2016.Status post168monthof treatment. 2) Tagrisso 80 mg p.o. daily. First dose started January 01, 2018. Status post 19 months of treatment  CURRENT THERAPY: Systemic chemotherapy with carboplatin for AUC 5, Alimta 500 mg/M2 and Avastin 15 mg/KG every 3 weeks. First dose February 10, 2018. Status post 8cycles.She started maintenance Alimta and Avastin starting from cycle #6.   INTERVAL HISTORY: Nancy [name] [age].o. female returns to the clinic for a follow up visit. The patient is feeling well today without any concerning complaints. She states for the last 3 months or so she has some twitching of the muscles in her forehead. She also notes this sensation sometimes in her hands. Denies any headaches. She notes some visual blurring which improves with her glasses. The patient continues to tolerate treatment with Alimta and Avastin well without any adverse effects except for mild controlled nausea. Denies any fever, chills, night sweats, or weight loss. Denies any chest pain or hemoptysis. She reports her baseline dyspnea on exertion and cough. Denies any nausea, vomiting, diarrhea, or constipation at this time. She uses linzess. Denies any abnormal bleeding or bruising. The patient is here today for evaluation prior to starting cycle #  9   MEDICAL HISTORY: Past Medical History:  Diagnosis Date  . Adenocarcinoma of left lung, stage 4 ([facility]10/02/2016  . Anemia   . Arthritis   . Constipation   . [facility]'s disease ([facility] [place]   in remission 35 years +  . Dyspnea    gets "winded"  . GERD (gastroesophageal reflux disease)   . Goals of care, counseling/discussion 10/28/2016  . History of hiatal hernia    noted on CT 12/20  . Hypertension   . Pneumonia    walking pneumonia in the late 80's  . Recurrent pleural effusion on left     ALLERGIES:  is allergic to percocet [oxycodone-acetaminophen], lisinopril, and losartan.  MEDICATIONS:  Current Outpatient Medications  Medication Sig Dispense Refill  . albuterol (VENTOLIN HFA) 108 (90 Base) MCG/ACT inhaler Inhale 2 puffs into the lungs every 6 (six) hours as needed for wheezing or shortness of breath. 8 g 1  . clindamycin (CLINDAGEL) 1 % gel Apply 1 application topically 2 (two) times daily. 30 g 1  . dexamethasone (DECADRON) 4 MG tablet 1 tablet p.o. twice daily the day before, day of and day after chemotherapy every 3 weeks (Patient taking differently: Take 4 mg by mouth See admin instructions. 4 mg  twice daily the day before, day of and day after chemotherapy every 3 weeks) 20 tablet 2  . fluticasone (FLONASE) 50 MCG/ACT nasal spray Place 2 sprays into both nostrils daily as needed for allergies. 16 g 3  . folic acid (FOLVITE) 1 MG tablet TAKE 1 TABLET BY MOUTH ONCE A DAY 30 tablet 4  . furosemide (LASIX) 40 MG tablet TAKE 1 TABLET BY MOUTH ONCE DAILY (Patient taking differently: Take 40  mg by mouth daily. ) 90 tablet 1  . guaiFENesin (MUCINEX) 600 MG 12 hr tablet Take 600 mg by mouth 2 (two) times daily as needed for to loosen phlegm.     Marland Kitchen HYDROcodone-homatropine (HYCODAN) 5-1.5 MG/5ML syrup Take 5 mLs by mouth every 6 (six) hours as needed for cough. 240 mL 0  . lidocaine-prilocaine (EMLA) cream APPLY TO THE AFFECTED AREA(S) AS NEEDED (Patient taking differently: Apply 1  application topically as needed [facility]). ) 30 g 0  . linaclotide (LINZESS) 145 MCG CAPS capsule Take 1 capsule (145 mcg total) by mouth daily before breakfast. 30 capsule 6  . loperamide (IMODIUM) 1 MG/5ML solution Take 10 mLs (2 mg total) by mouth as needed for diarrhea or loose stools. (Patient taking differently: Take 2 mg by mouth daily as needed for diarrhea or loose stools. ) 120 mL 0  . loratadine (CLARITIN) 10 MG tablet Take 10 mg by mouth daily.    . magnesium oxide (MAG-OX) 400 (241.3 Mg) MG tablet Take 1 tablet (400 mg total) by mouth daily.    . methocarbamol (ROBAXIN) 500 MG tablet TAKE 1 TABLET (500 MG TOTAL) BY MOUTH EVERY 8 (EIGHT) HOURS AS NEEDED FOR MUSCLE SPASMS. 30 tablet 5  . nystatin cream (MYCOSTATIN) APPLY 1 APPLICATION TOPICALLY 2 (TWO) TIMES DAILY. (Patient taking differently: Apply 1 application topically daily as needed (skin irritaion). ) 30 g 0  . ondansetron (ZOFRAN) 8 MG tablet TAKE 1 TABLET BY MOUTH EVERY 8 HOURS AS NEEDED FOR NAUSEA OR VOMITING (Patient taking differently: Take 8 mg by mouth every 8 (eight) hours as needed for nausea or vomiting. ) 30 tablet 2  . osimertinib mesylate (TAGRISSO) 80 MG tablet Take 1 tablet (80 mg total) by mouth daily. 30 tablet 2  . oxyCODONE (OXY IR/ROXICODONE) 5 MG immediate release tablet Take 1 tablet (5 mg total) by mouth every 6 (six) hours as needed for severe pain. 30 tablet 0  . pantoprazole (PROTONIX) 40 MG tablet TAKE 1 TABLET (40 MG TOTAL) BY MOUTH DAILY AT 12 NOON. (Patient taking differently: Take 40 mg by mouth daily. ) 90 tablet 3  . potassium chloride SA (K-DUR) 20 MEQ tablet TAKE 1 TABLET (20 MEQ TOTAL) BY MOUTH 2 TIMES DAILY. (Patient taking differently: Take 20 mEq by mouth 2 (two) times daily. ) 180 tablet 3  . prochlorperazine (COMPAZINE) 10 MG tablet Take 1 tablet (10 mg total) by mouth every 6 (six) hours as needed for nausea or vomiting. 30 tablet 2  . senna-docusate (SENOKOT-S) 8.6-50 MG tablet Take 1 tablet by  mouth at bedtime.    . traMADol (ULTRAM) 50 MG tablet Take 1 tablet (50 mg total) by mouth every 6 (six) hours as needed. 40 tablet 0   No current facility-administered medications for this visit.    SURGICAL HISTORY:  Past Surgical History:  Procedure Laterality Date  . ABDOMINAL HYSTERECTOMY     partial hysterectomy  . BREAST EXCISIONAL BIOPSY Left 20+ yrs ago   benign  . CHEST TUBE INSERTION Left 10/26/2018   Procedure: INSERTION PLEURAL DRAINAGE CATHETER;  Surgeon: Ivin Poot, MD;  Location: [facility];  Service: Thoracic;  Laterality: Left;  . COLONOSCOPY    . IR IMAGING GUIDED PORT INSERTION  07/04/2019  . PERICARDIAL WINDOW N/A 01/10/2017   Procedure: PERICARDIAL WINDOW- SUB XYPHOID, RIGHT CHEST TUBE;  Surgeon: Ivin Poot, MD;  Location: [facility];  Service: Thoracic;  Laterality: N/A;  . PORT-A-CATH REMOVAL Left 05/10/2019   Procedure:  REMOVAL PORT-A-CATH;  Surgeon: Ivin Poot, MD;  Location: [facility];  Service: Thoracic;  Laterality: Left;  . PORTACATH PLACEMENT Left 02/21/2019   Procedure: INSERTION PORT-A-CATH;  Surgeon: Ivin Poot, MD;  Location: [facility];  Service: Thoracic;  Laterality: Left;  . REMOVAL OF PLEURAL DRAINAGE CATHETER Left 02/21/2019   Procedure: REMOVAL OF PLEURAL DRAINAGE CATHETER;  Surgeon: Ivin Poot, MD;  Location: [facility];  Service: Thoracic;  Laterality: Left;  Marland Kitchen VIDEO BRONCHOSCOPY Bilateral 10/21/2016   Procedure: VIDEO BRONCHOSCOPY WITH FLUORO;  Surgeon: Tanda Rockers, MD;  Location: [facility];  Service: Cardiopulmonary;  Laterality: Bilateral;    REVIEW OF SYSTEMS:   Review of Systems  Constitutional: Negative for appetite change, chills, fatigue, fever and unexpected weight change.  HENT: Negative for mouth sores, nosebleeds, sore throat and trouble swallowing.   Eyes: Negative for eye problems and icterus.  Respiratory: Positive for baseline cough and shortness of breath with exertion. Negative for hemoptysis and wheezing.    Cardiovascular: Negative for chest pain and leg swelling.  Gastrointestinal: Negative for abdominal pain, constipation (improved), diarrhea, nausea and vomiting (controlled).  Genitourinary: Negative for bladder incontinence, difficulty urinating, dysuria, frequency and hematuria.   Musculoskeletal: Negative for back pain, gait problem, neck pain and neck stiffness.  Skin: Negative for itching and rash.  Neurological: Positive for muscle twitching. Negative for dizziness, extremity weakness, gait problem, headaches, light-headedness and seizures.  Hematological: Negative for adenopathy. Does not bruise/bleed easily.  Psychiatric/Behavioral: Negative for confusion, depression and sleep disturbance. The patient is not nervous/anxious.     PHYSICAL EXAMINATION:  Blood pressure 135/81, pulse 90, temperature (!) 97.3 F (36.3 C), temperature source Temporal, resp. rate 18, height 5' 6"  (1.676 m), weight 195 lb 14.4 oz (88.9 kg), last menstrual period 03/15/2010, SpO2 99 %.  ECOG PERFORMANCE STATUS: 1 - Symptomatic but completely ambulatory  Physical Exam  Constitutional: Oriented to person, place, and time and well-developed, well-nourished, and in no distress.  HENT:  Head: Normocephalic and atraumatic.  Mouth/Throat: Oropharynx is clear and moist. No oropharyngeal exudate.  Eyes: Conjunctivae are normal. Right eye exhibits no discharge. Left eye exhibits no discharge. No scleral icterus.  Neck: Normal range of motion. Neck supple.  Cardiovascular: Normal rate, regular rhythm, normal heart sounds and intact distal pulses.   Pulmonary/Chest: Effort normal. Decreased breath sounds in left lower lung field. No respiratory distress. No wheezes. No rales.  Abdominal: Soft. Bowel sounds are normal. Exhibits no distension and no mass. There is no tenderness.  Musculoskeletal: Normal range of motion. Exhibits no edema.  Lymphadenopathy:    No cervical adenopathy.  Neurological: Alert and oriented  to person, place, and time. Exhibits normal muscle tone. Gait normal. Coordination normal.  Skin: Skin is warm and dry. No rash noted. Not diaphoretic. No erythema. No pallor.  Psychiatric: Mood, memory and judgment normal.  Vitals reviewed.  LABORATORY DATA: Lab Results  Component Value Date   WBC 9.3 08/04/2019   HGB 10.4 (L) 08/04/2019   HCT 34.0 (L) 08/04/2019   MCV 104.3 (H) 08/04/2019   PLT 287 08/04/2019      Chemistry      Component Value Date/Time   NA 142 08/04/2019 1328   NA 135 (L) 01/08/2017 1055   K 3.9 08/04/2019 1328   K 3.2 (L) 01/08/2017 1055   CL 106 08/04/2019 1328   CO2 22 08/04/2019 1328   CO2 25 01/08/2017 1055   BUN 12 08/04/2019 1328   BUN 14.6 01/08/2017 1055  CREATININE 1.05 (H) 08/04/2019 1328   CREATININE 1.3 (H) 01/08/2017 1055      Component Value Date/Time   CALCIUM 9.7 08/04/2019 1328   CALCIUM 9.5 01/08/2017 1055   ALKPHOS 95 08/04/2019 1328   ALKPHOS 82 01/08/2017 1055   AST 21 08/04/2019 1328   AST 31 01/08/2017 1055   ALT 13 08/04/2019 1328   ALT 34 01/08/2017 1055   BILITOT 0.3 08/04/2019 1328   BILITOT 1.74 (H) 01/08/2017 1055       RADIOGRAPHIC STUDIES:  No results found.   ASSESSMENT/PLAN:  This is a very pleasant 54 year old African American female diagnosed with metastatic non-small cell lung cancer, adenocarcinoma. She presented withextensive miliary distribution in the lungs bilaterally diagnosed in September 2018.She is Positive for EGFR mutation in exon 19. She was diagnosed in September 2018.   She is status post 13 months of treatment with Gilotrif 40 mg p.o. daily. This was discontinued due to evidence of disease progression.   She then was started on Tagrisso 80 mg p.o. daily. She is status post 19 months of treatment.   She was referred to [place] and repeat molecular studies was performed which showed BRAF mutation V600E.  Dr. Julien Nordmann recommended that she consider  palliative systemic chemotherapy withcarboplatin for AUC of 5, Alimta 500 mg/M2 and Avastin 15 mg/KG every 3 weeks. She is currently undergoing this and is status post 8 cycles in addition to 80 mg p.o. daily of Tagrisso. She started maintenance Alimta and Avastin starting from cycle #6   Labs were reviewed.  Recommend that she proceed with cycle #9 today as scheduled.    I will arrange for a restaging CT scan of the chest, abdomen, and pelvis prior to starting her next cycle of treatment. We will also arrange for a restaging brain MRI due to her history of metastatic disease to the brain. Regarding her occasional twitching, we will assess for any neurological etiology on her imaging studies, in the meantime, no significant electrolyte derangements seen on labs. She was advised to stay hydrated.   The patient was advised to call immediately if she has any concerning symptoms in the interval. The patient voices understanding of current disease status and treatment options and is in agreement with the current care plan. All questions were answered. The patient knows to call the clinic with any problems, questions or concerns. We can certainly see the patient much sooner if necessary       Orders Placed This Encounter  Procedures  . CT Chest W Contrast    Standing Status:   Future    Standing Expiration Date:   08/03/2020    Order Specific Question:   ** REASON FOR EXAM (FREE TEXT)    Answer:   Restaging Lung Cancer    Order Specific Question:   If indicated for the ordered procedure, I authorize the administration of contrast media per Radiology protocol    Answer:   Yes    Order Specific Question:   Is patient pregnant?    Answer:   No    Order Specific Question:   Preferred imaging location?    Answer:   [facility]    Order Specific Question:   Radiology Contrast Protocol - do NOT remove file path    Answer:   \\[email or web address].pdf  . CT Abdomen Pelvis W  Contrast    Standing Status:   Future    Standing Expiration Date:   08/03/2020    Order  Specific Question:   ** REASON FOR EXAM (FREE TEXT)    Answer:   Restaging Lung Cancer    Order Specific Question:   If indicated for the ordered procedure, I authorize the administration of contrast media per Radiology protocol    Answer:   Yes    Order Specific Question:   Is patient pregnant?    Answer:   No    Order Specific Question:   Preferred imaging location?    Answer:   [facility]    Order Specific Question:   Is Oral Contrast requested for this exam?    Answer:   Yes, Per Radiology protocol    Order Specific Question:   Radiology Contrast Protocol - do NOT remove file path    Answer:   \\[email or web address].pdf  . MR Brain W Wo Contrast    Standing Status:   Future    Standing Expiration Date:   08/03/2020    Order Specific Question:   ** REASON FOR EXAM (FREE TEXT)    Answer:   Restaging Lung cancer with history of metastatic disease to the brain    Order Specific Question:   If indicated for the ordered procedure, I authorize the administration of contrast media per Radiology protocol    Answer:   Yes    Order Specific Question:   What is the patient's sedation requirement?    Answer:   No Sedation    Order Specific Question:   Does the patient have a pacemaker or implanted devices?    Answer:   No    Order Specific Question:   Use SRS Protocol?    Answer:   No    Order Specific Question:   Radiology Contrast Protocol - do NOT remove file path    Answer:   \\[email or web address]    Order Specific Question:   Preferred imaging location?    Answer:   [facility] (table limit - 550 lbs)     Nancy Moore L Nadyne Gariepy, PA-C 08/04/19

## 2019-08-04 ENCOUNTER — Inpatient Hospital Stay (HOSPITAL_BASED_OUTPATIENT_CLINIC_OR_DEPARTMENT_OTHER): Payer: Medicare Other | Admitting: Physician Assistant

## 2019-08-04 ENCOUNTER — Other Ambulatory Visit: Payer: Self-pay

## 2019-08-04 ENCOUNTER — Inpatient Hospital Stay: Payer: Medicare Other

## 2019-08-04 ENCOUNTER — Inpatient Hospital Stay: Payer: Medicare Other | Attending: Oncology

## 2019-08-04 VITALS — BP 135/81 | HR 90 | Temp 97.3°F | Resp 18 | Ht 66.0 in | Wt 195.9 lb

## 2019-08-04 DIAGNOSIS — Z452 Encounter for adjustment and management of vascular access device: Secondary | ICD-10-CM | POA: Diagnosis not present

## 2019-08-04 DIAGNOSIS — C3490 Malignant neoplasm of unspecified part of unspecified bronchus or lung: Secondary | ICD-10-CM

## 2019-08-04 DIAGNOSIS — R21 Rash and other nonspecific skin eruption: Secondary | ICD-10-CM | POA: Insufficient documentation

## 2019-08-04 DIAGNOSIS — Z5111 Encounter for antineoplastic chemotherapy: Secondary | ICD-10-CM | POA: Insufficient documentation

## 2019-08-04 DIAGNOSIS — R253 Fasciculation: Secondary | ICD-10-CM | POA: Insufficient documentation

## 2019-08-04 DIAGNOSIS — K5090 Crohn's disease, unspecified, without complications: Secondary | ICD-10-CM | POA: Diagnosis not present

## 2019-08-04 DIAGNOSIS — C3492 Malignant neoplasm of unspecified part of left bronchus or lung: Secondary | ICD-10-CM

## 2019-08-04 DIAGNOSIS — Z95828 Presence of other vascular implants and grafts: Secondary | ICD-10-CM

## 2019-08-04 DIAGNOSIS — Z5112 Encounter for antineoplastic immunotherapy: Secondary | ICD-10-CM | POA: Insufficient documentation

## 2019-08-04 DIAGNOSIS — C7931 Secondary malignant neoplasm of brain: Secondary | ICD-10-CM

## 2019-08-04 LAB — CBC WITH DIFFERENTIAL (CANCER CENTER ONLY)
Abs Immature Granulocytes: 0.04 10*3/uL (ref 0.00–0.07)
Basophils Absolute: 0 10*3/uL (ref 0.0–0.1)
Basophils Relative: 0 %
Eosinophils Absolute: 0 10*3/uL (ref 0.0–0.5)
Eosinophils Relative: 0 %
HCT: 34 % — ABNORMAL LOW (ref 36.0–46.0)
Hemoglobin: 10.4 g/dL — ABNORMAL LOW (ref 12.0–15.0)
Immature Granulocytes: 0 %
Lymphocytes Relative: 11 %
Lymphs Abs: 1 10*3/uL (ref 0.7–4.0)
MCH: 31.9 pg (ref 26.0–34.0)
MCHC: 30.6 g/dL (ref 30.0–36.0)
MCV: 104.3 fL — ABNORMAL HIGH (ref 80.0–100.0)
Monocytes Absolute: 1 10*3/uL (ref 0.1–1.0)
Monocytes Relative: 11 %
Neutro Abs: 7.2 10*3/uL (ref 1.7–7.7)
Neutrophils Relative %: 78 %
Platelet Count: 287 10*3/uL (ref 150–400)
RBC: 3.26 MIL/uL — ABNORMAL LOW (ref 3.87–5.11)
RDW: 16.5 % — ABNORMAL HIGH (ref 11.5–15.5)
WBC Count: 9.3 10*3/uL (ref 4.0–10.5)
nRBC: 0 % (ref 0.0–0.2)

## 2019-08-04 LAB — CMP (CANCER CENTER ONLY)
ALT: 13 U/L (ref 0–44)
AST: 21 U/L (ref 15–41)
Albumin: 3.4 g/dL — ABNORMAL LOW (ref 3.5–5.0)
Alkaline Phosphatase: 95 U/L (ref 38–126)
Anion gap: 14 (ref 5–15)
BUN: 12 mg/dL (ref 6–20)
CO2: 22 mmol/L (ref 22–32)
Calcium: 9.7 mg/dL (ref 8.9–10.3)
Chloride: 106 mmol/L (ref 98–111)
Creatinine: 1.05 mg/dL — ABNORMAL HIGH (ref 0.44–1.00)
GFR, Est AFR Am: >60 mL/min (ref >60)
GFR, Estimated: >60 mL/min (ref >60)
Glucose, Bld: 144 mg/dL — ABNORMAL HIGH (ref 70–99)
Potassium: 3.9 mmol/L (ref 3.5–5.1)
Sodium: 142 mmol/L (ref 135–145)
Total Bilirubin: 0.3 mg/dL (ref 0.3–1.2)
Total Protein: 7.2 g/dL (ref 6.5–8.1)

## 2019-08-04 LAB — TOTAL PROTEIN, URINE DIPSTICK: Protein, ur: <30 mg/dL — AB

## 2019-08-04 MED ORDER — SODIUM CHLORIDE 0.9% FLUSH
10.0000 mL | INTRAVENOUS | Status: DC | PRN
  Administered 2019-08-04: 10 mL
  Filled 2019-08-04: qty 10

## 2019-08-04 MED ORDER — SODIUM CHLORIDE 0.9 % IV SOLN
15.0000 mg/kg | Freq: Once | INTRAVENOUS | Status: AC
  Administered 2019-08-04: 1300 mg via INTRAVENOUS
  Filled 2019-08-04: qty 4

## 2019-08-04 MED ORDER — SODIUM CHLORIDE 0.9 % IV SOLN
Freq: Once | INTRAVENOUS | Status: AC
  Filled 2019-08-04: qty 250

## 2019-08-04 MED ORDER — HEPARIN SOD (PORK) LOCK FLUSH 100 UNIT/ML IV SOLN
500.0000 [IU] | Freq: Once | INTRAVENOUS | Status: AC | PRN
  Administered 2019-08-04: 500 [IU]
  Filled 2019-08-04: qty 5

## 2019-08-04 MED ORDER — SODIUM CHLORIDE 0.9 % IV SOLN
400.0000 mg/m2 | Freq: Once | INTRAVENOUS | Status: AC
  Administered 2019-08-04: 800 mg via INTRAVENOUS
  Filled 2019-08-04: qty 12

## 2019-08-04 MED ORDER — PROCHLORPERAZINE MALEATE 10 MG PO TABS
10.0000 mg | ORAL_TABLET | Freq: Once | ORAL | Status: AC
  Administered 2019-08-04: 10 mg via ORAL

## 2019-08-04 MED ORDER — CYANOCOBALAMIN 1000 MCG/ML IJ SOLN
1000.0000 ug | Freq: Once | INTRAMUSCULAR | Status: AC
  Administered 2019-08-04: 1000 ug via INTRAMUSCULAR

## 2019-08-04 MED ORDER — PROCHLORPERAZINE MALEATE 10 MG PO TABS
ORAL_TABLET | ORAL | Status: AC
  Filled 2019-08-04: qty 1

## 2019-08-04 MED ORDER — CYANOCOBALAMIN 1000 MCG/ML IJ SOLN
INTRAMUSCULAR | Status: AC
  Filled 2019-08-04: qty 1

## 2019-08-04 NOTE — Patient Instructions (Signed)
[facility] Discharge Instructions for Patients Receiving Chemotherapy  Today you received the following chemotherapy agents: avastin, alimta   To help prevent nausea and vomiting after your treatment, we encourage you to take your nausea medication as directed.    If you develop nausea and vomiting that is not controlled by your nausea medication, call the clinic.   BELOW ARE SYMPTOMS THAT SHOULD BE REPORTED IMMEDIATELY:  *FEVER GREATER THAN 100.5 F  *CHILLS WITH OR WITHOUT FEVER  NAUSEA AND VOMITING THAT IS NOT CONTROLLED WITH YOUR NAUSEA MEDICATION  *UNUSUAL SHORTNESS OF BREATH  *UNUSUAL BRUISING OR BLEEDING  TENDERNESS IN MOUTH AND THROAT WITH OR WITHOUT PRESENCE OF ULCERS  *URINARY PROBLEMS  *BOWEL PROBLEMS  UNUSUAL RASH Items with * indicate a potential emergency and should be followed up as soon as possible.  Feel free to call the clinic should you have any questions or concerns. The clinic phone number is [phone].  Please show the [facility] at check-in to the Emergency Department and triage nurse.

## 2019-08-08 ENCOUNTER — Telehealth: Payer: Self-pay | Admitting: Medical Oncology

## 2019-08-08 NOTE — Telephone Encounter (Signed)
Schedule changes .

## 2019-08-09 ENCOUNTER — Other Ambulatory Visit: Payer: Self-pay | Admitting: Family Medicine

## 2019-08-09 DIAGNOSIS — R609 Edema, unspecified: Secondary | ICD-10-CM

## 2019-08-09 MED FILL — POTASSIUM CHLORIDE CRYS ER: 20 | 30 days supply | Qty: 60 | Fill #10

## 2019-08-09 MED FILL — LINZESS 145 MCG CAPSULE: 145 | 30 days supply | Qty: 30 | Fill #2

## 2019-08-09 MED FILL — FUROSEMIDE 40 MG TAB: 40 | 30 days supply | Qty: 30 | Fill #0

## 2019-08-09 MED FILL — FOLIC ACID 1 MG TABS: 1 | 30 days supply | Qty: 30 | Fill #1

## 2019-08-09 MED FILL — FLUTICASONE PROP 50 MCG SPR: 50 | 30 days supply | Qty: 16 | Fill #1

## 2019-08-09 MED FILL — PANTOPRAZOLE SOD DR 40 MG T: 40 | 30 days supply | Qty: 30 | Fill #10

## 2019-08-12 ENCOUNTER — Telehealth: Payer: Self-pay | Admitting: Internal Medicine

## 2019-08-12 NOTE — Telephone Encounter (Signed)
Scheduled appt per 7/12 sch msg - pt is aware of appts.

## 2019-08-16 ENCOUNTER — Telehealth: Payer: Self-pay | Admitting: Medical Oncology

## 2019-08-16 NOTE — Telephone Encounter (Signed)
Asking if scans authorized

## 2019-08-17 ENCOUNTER — Inpatient Hospital Stay: Payer: Medicare Other

## 2019-08-17 ENCOUNTER — Ambulatory Visit (HOSPITAL_COMMUNITY): Payer: 59

## 2019-08-18 MED FILL — DEXAMETHASONE 4 MG TABLET: 4 | 21 days supply | Qty: 6 | Fill #8

## 2019-08-18 MED FILL — TAGRISSO 80 MG TABLET: 80 | 30 days supply | Qty: 30 | Fill #1

## 2019-08-19 ENCOUNTER — Ambulatory Visit (HOSPITAL_COMMUNITY): Payer: 59

## 2019-08-19 ENCOUNTER — Other Ambulatory Visit: Payer: Self-pay | Admitting: Physician Assistant

## 2019-08-19 DIAGNOSIS — C3492 Malignant neoplasm of unspecified part of left bronchus or lung: Secondary | ICD-10-CM

## 2019-08-22 ENCOUNTER — Other Ambulatory Visit: Payer: 59

## 2019-08-22 ENCOUNTER — Ambulatory Visit (HOSPITAL_COMMUNITY)
Admission: RE | Admit: 2019-08-22 | Discharge: 2019-08-22 | Disposition: A | Discharge status: Home / Self Care | Payer: Medicare Other | Source: Ambulatory Visit | Attending: Physician Assistant | Admitting: Physician Assistant

## 2019-08-22 ENCOUNTER — Inpatient Hospital Stay: Payer: Medicare Other

## 2019-08-22 ENCOUNTER — Other Ambulatory Visit: Payer: Self-pay

## 2019-08-22 DIAGNOSIS — C3490 Malignant neoplasm of unspecified part of unspecified bronchus or lung: Secondary | ICD-10-CM | POA: Diagnosis not present

## 2019-08-22 DIAGNOSIS — C3492 Malignant neoplasm of unspecified part of left bronchus or lung: Secondary | ICD-10-CM

## 2019-08-22 DIAGNOSIS — R253 Fasciculation: Secondary | ICD-10-CM | POA: Diagnosis not present

## 2019-08-22 DIAGNOSIS — K5090 Crohn's disease, unspecified, without complications: Secondary | ICD-10-CM | POA: Diagnosis not present

## 2019-08-22 DIAGNOSIS — Z95828 Presence of other vascular implants and grafts: Secondary | ICD-10-CM

## 2019-08-22 DIAGNOSIS — Z452 Encounter for adjustment and management of vascular access device: Secondary | ICD-10-CM | POA: Diagnosis not present

## 2019-08-22 DIAGNOSIS — Z5112 Encounter for antineoplastic immunotherapy: Secondary | ICD-10-CM | POA: Diagnosis not present

## 2019-08-22 DIAGNOSIS — R21 Rash and other nonspecific skin eruption: Secondary | ICD-10-CM | POA: Diagnosis not present

## 2019-08-22 DIAGNOSIS — Z5111 Encounter for antineoplastic chemotherapy: Secondary | ICD-10-CM | POA: Diagnosis not present

## 2019-08-22 MED ORDER — HEPARIN SOD (PORK) LOCK FLUSH 100 UNIT/ML IV SOLN
500.0000 [IU] | Freq: Once | INTRAVENOUS | Status: AC
  Administered 2019-08-22: 500 [IU] via INTRAVENOUS

## 2019-08-22 MED ORDER — IOHEXOL 300 MG/ML  SOLN
100.0000 mL | Freq: Once | INTRAMUSCULAR | Status: AC | PRN
  Administered 2019-08-22: 100 mL via INTRAVENOUS

## 2019-08-22 MED ORDER — HEPARIN SOD (PORK) LOCK FLUSH 100 UNIT/ML IV SOLN
INTRAVENOUS | Status: AC
  Filled 2019-08-22: qty 5

## 2019-08-22 MED ORDER — SODIUM CHLORIDE (PF) 0.9 % IJ SOLN
INTRAMUSCULAR | Status: AC
  Filled 2019-08-22: qty 50

## 2019-08-22 MED ORDER — HEPARIN SOD (PORK) LOCK FLUSH 100 UNIT/ML IV SOLN
500.0000 [IU] | Freq: Once | INTRAVENOUS | Status: DC | PRN
  Filled 2019-08-22: qty 5

## 2019-08-22 MED ORDER — SODIUM CHLORIDE 0.9% FLUSH
10.0000 mL | INTRAVENOUS | Status: DC | PRN
  Administered 2019-08-22: 10 mL
  Filled 2019-08-22: qty 10

## 2019-08-24 ENCOUNTER — Encounter: Payer: Self-pay | Admitting: Internal Medicine

## 2019-08-24 ENCOUNTER — Other Ambulatory Visit: Payer: Self-pay | Admitting: Physician Assistant

## 2019-08-24 ENCOUNTER — Other Ambulatory Visit: Payer: Self-pay

## 2019-08-24 ENCOUNTER — Other Ambulatory Visit: Payer: Self-pay | Admitting: Internal Medicine

## 2019-08-24 ENCOUNTER — Inpatient Hospital Stay: Payer: Medicare Other

## 2019-08-24 ENCOUNTER — Other Ambulatory Visit: Payer: 59

## 2019-08-24 ENCOUNTER — Other Ambulatory Visit: Payer: Self-pay | Admitting: *Deleted

## 2019-08-24 ENCOUNTER — Inpatient Hospital Stay (HOSPITAL_BASED_OUTPATIENT_CLINIC_OR_DEPARTMENT_OTHER): Payer: Medicare Other | Admitting: Internal Medicine

## 2019-08-24 VITALS — BP 125/76 | HR 75

## 2019-08-24 VITALS — BP 128/72 | HR 88 | Temp 97.9°F | Resp 18 | Ht 66.0 in | Wt 193.0 lb

## 2019-08-24 DIAGNOSIS — C3492 Malignant neoplasm of unspecified part of left bronchus or lung: Secondary | ICD-10-CM

## 2019-08-24 DIAGNOSIS — Z5111 Encounter for antineoplastic chemotherapy: Secondary | ICD-10-CM | POA: Diagnosis not present

## 2019-08-24 DIAGNOSIS — C3490 Malignant neoplasm of unspecified part of unspecified bronchus or lung: Secondary | ICD-10-CM

## 2019-08-24 DIAGNOSIS — C7931 Secondary malignant neoplasm of brain: Secondary | ICD-10-CM | POA: Diagnosis not present

## 2019-08-24 DIAGNOSIS — Z95828 Presence of other vascular implants and grafts: Secondary | ICD-10-CM

## 2019-08-24 DIAGNOSIS — R253 Fasciculation: Secondary | ICD-10-CM | POA: Diagnosis not present

## 2019-08-24 DIAGNOSIS — Z5112 Encounter for antineoplastic immunotherapy: Secondary | ICD-10-CM | POA: Diagnosis not present

## 2019-08-24 DIAGNOSIS — I10 Essential (primary) hypertension: Secondary | ICD-10-CM

## 2019-08-24 DIAGNOSIS — Z452 Encounter for adjustment and management of vascular access device: Secondary | ICD-10-CM | POA: Diagnosis not present

## 2019-08-24 DIAGNOSIS — K5090 Crohn's disease, unspecified, without complications: Secondary | ICD-10-CM | POA: Diagnosis not present

## 2019-08-24 DIAGNOSIS — R52 Pain, unspecified: Secondary | ICD-10-CM

## 2019-08-24 DIAGNOSIS — R21 Rash and other nonspecific skin eruption: Secondary | ICD-10-CM | POA: Diagnosis not present

## 2019-08-24 LAB — CBC WITH DIFFERENTIAL (CANCER CENTER ONLY)
Abs Immature Granulocytes: 0.03 10*3/uL (ref 0.00–0.07)
Basophils Absolute: 0 10*3/uL (ref 0.0–0.1)
Basophils Relative: 0 %
Eosinophils Absolute: 0 10*3/uL (ref 0.0–0.5)
Eosinophils Relative: 0 %
HCT: 35 % — ABNORMAL LOW (ref 36.0–46.0)
Hemoglobin: 10.8 g/dL — ABNORMAL LOW (ref 12.0–15.0)
Immature Granulocytes: 0 %
Lymphocytes Relative: 12 %
Lymphs Abs: 1.1 10*3/uL (ref 0.7–4.0)
MCH: 32.2 pg (ref 26.0–34.0)
MCHC: 30.9 g/dL (ref 30.0–36.0)
MCV: 104.5 fL — ABNORMAL HIGH (ref 80.0–100.0)
Monocytes Absolute: 1 10*3/uL (ref 0.1–1.0)
Monocytes Relative: 11 %
Neutro Abs: 7.6 10*3/uL (ref 1.7–7.7)
Neutrophils Relative %: 77 %
Platelet Count: 287 10*3/uL (ref 150–400)
RBC: 3.35 MIL/uL — ABNORMAL LOW (ref 3.87–5.11)
RDW: 15.4 % (ref 11.5–15.5)
WBC Count: 9.8 10*3/uL (ref 4.0–10.5)
nRBC: 0 % (ref 0.0–0.2)

## 2019-08-24 LAB — CMP (CANCER CENTER ONLY)
ALT: 11 U/L (ref 0–44)
AST: 23 U/L (ref 15–41)
Albumin: 3.5 g/dL (ref 3.5–5.0)
Alkaline Phosphatase: 78 U/L (ref 38–126)
Anion gap: 11 (ref 5–15)
BUN: 15 mg/dL (ref 6–20)
CO2: 28 mmol/L (ref 22–32)
Calcium: 10.3 mg/dL (ref 8.9–10.3)
Chloride: 103 mmol/L (ref 98–111)
Creatinine: 1.21 mg/dL — ABNORMAL HIGH (ref 0.44–1.00)
GFR, Est AFR Am: 59 mL/min — ABNORMAL LOW (ref >60)
GFR, Estimated: 51 mL/min — ABNORMAL LOW (ref >60)
Glucose, Bld: 120 mg/dL — ABNORMAL HIGH (ref 70–99)
Potassium: 3.6 mmol/L (ref 3.5–5.1)
Sodium: 142 mmol/L (ref 135–145)
Total Bilirubin: 0.3 mg/dL (ref 0.3–1.2)
Total Protein: 7.4 g/dL (ref 6.5–8.1)

## 2019-08-24 LAB — TOTAL PROTEIN, URINE DIPSTICK

## 2019-08-24 MED ORDER — PROCHLORPERAZINE MALEATE 10 MG PO TABS
ORAL_TABLET | ORAL | Status: AC
  Filled 2019-08-24: qty 1

## 2019-08-24 MED ORDER — SODIUM CHLORIDE 0.9 % IV SOLN
400.0000 mg/m2 | Freq: Once | INTRAVENOUS | Status: AC
  Administered 2019-08-24: 800 mg via INTRAVENOUS
  Filled 2019-08-24: qty 20

## 2019-08-24 MED ORDER — HEPARIN SOD (PORK) LOCK FLUSH 100 UNIT/ML IV SOLN
500.0000 [IU] | Freq: Once | INTRAVENOUS | Status: AC | PRN
  Administered 2019-08-24: 500 [IU]
  Filled 2019-08-24: qty 5

## 2019-08-24 MED ORDER — SODIUM CHLORIDE 0.9% FLUSH
10.0000 mL | INTRAVENOUS | Status: DC | PRN
  Administered 2019-08-24: 10 mL
  Filled 2019-08-24: qty 10

## 2019-08-24 MED ORDER — SODIUM CHLORIDE 0.9 % IV SOLN
15.0000 mg/kg | Freq: Once | INTRAVENOUS | Status: AC
  Administered 2019-08-24: 1300 mg via INTRAVENOUS
  Filled 2019-08-24: qty 48

## 2019-08-24 MED ORDER — PROCHLORPERAZINE MALEATE 10 MG PO TABS
10.0000 mg | ORAL_TABLET | Freq: Once | ORAL | Status: AC
  Administered 2019-08-24: 10 mg via ORAL

## 2019-08-24 MED ORDER — SODIUM CHLORIDE 0.9 % IV SOLN
Freq: Once | INTRAVENOUS | Status: AC
  Filled 2019-08-24: qty 250

## 2019-08-24 MED FILL — oxyCODONE HCL 5 MG TABS: 5 | 7 days supply | Qty: 30 | Fill #0

## 2019-08-24 MED FILL — LIDOCAINE-PRILOCAINE 2.5-2.: 2.5-2.5 | 30 days supply | Qty: 30 | Fill #0

## 2019-08-24 MED FILL — PROCHLORPERAZINE 10 MG TAB: 10 | 8 days supply | Qty: 30 | Fill #1

## 2019-08-24 NOTE — Progress Notes (Signed)
[facility] [phone]   [phone]  OFFICE PROGRESS NOTE  Cop[place], Gay Filler, MD [address] [place] [address]  DIAGNOSIS: DIAGNOSIS: stage IV (T3, N2, M1 a) non-small cell lung cancer, poorly differentiated adenocarcinoma with extensive miliary distribution in the lungs bilaterally diagnosed in September 2018. 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 NEGATIVE for the Exon 20 T790M mutation. Repeat molecular studies by guardant 360 at time of progression showed ATM [id number] [facility] B[facility], V600E  PRIOR THERAPY:  1) Gilotrif 40 mg daily started 11/08/2016.Status post 13 months of treatment. 2) Tagrisso 80 mg p.o. daily.  First dose started January 01, 2018.  Status post 18 months of treatment.   CURRENT THERAPY: 1) systemic chemotherapy with carboplatin for AUC 5, Alimta 500 mg/M2 and Avastin 15 mg/KG every 3 weeks.  First dose February 10, 2018.  Status post 9 cycles.  Starting from cycle #7 she will be on maintenance treatment with Alimta and Avastin in addition to her treatment with Tagrisso.  INTERVAL HISTORY: [name] 54y.o. female returns to the clinic today for follow-up visit.  The patient is feeling fine today with no concerning complaints.  She has been tolerating her treatment with maintenance Alimta and Avastin fairly well.  She denied having any chest pain, shortness of breath, cough or hemoptysis.  She denied having any fever or chills.  She has no nausea, vomiting, diarrhea or constipation.  She denied having any headache or visual changes.  She has no recent weight loss or night sweats.  The patient had repeat CT scan of the chest, abdomen pelvis performed recently and she is here for evaluation and discussion of her scan results.  She is also scheduled to have repeat MRI of the brain tomorrow  morning.    MEDICAL HISTORY: Past Medical History:  Diagnosis Date  . Adenocarcinoma of left lung, stage 4 ([facility] 10/28/2016  . Anemia   . Arthritis   . Constipation   . Crohn's disease ([facility]    in remission 35 years +  . Dyspnea    gets "winded"  . GERD (gastroesophageal reflux disease)   . Goals of care, counseling/discussion 10/28/2016  . History of hiatal hernia    noted on CT 12/20  . Hypertension   . Pneumonia    walking pneumonia in the late 80's  . Recurrent pleural effusion on left     ALLERGIES:  is allergic to percocet [oxycodone-acetaminophen], lisinopril, and losartan.  MEDICATIONS:  Current Outpatient Medications  Medication Sig Dispense Refill  . albuterol (VENTOLIN HFA) 108 (90 Base) MCG/ACT inhaler Inhale 2 puffs into the lungs every 6 (six) hours as needed for wheezing or shortness of breath. 8 g 1  . clindamycin (CLINDAGEL) 1 % gel Apply 1 application topically 2 (two) times daily. 30 g 1  . dexamethasone (DECADRON) 4 MG tablet 1 tablet p.o. twice daily the day before, day of and day after chemotherapy every 3 weeks (Patient taking differently: Take 4 mg by mouth See admin instructions. 4 mg  twice daily the day before, day of and day after chemotherapy every 3 weeks) 20 tablet 2  . fluticasone (FLONASE) 50 MCG/ACT nasal spray Place 2 sprays into both nostrils daily as needed for allergies. 16 g 3  . folic acid (FOLVITE) 1 MG tablet TAKE 1 TABLET BY MOUTH ONCE A DAY  30 tablet 4  . furosemide (LASIX) 40 MG tablet Take 1 tablet (40 mg total) by mouth daily. 30 tablet 3  . guaiFENesin (MUCINEX) 600 MG 12 hr tablet Take 600 mg by mouth 2 (two) times daily as needed for to loosen phlegm.     Marland Kitchen HYDROcodone-homatropine (HYCODAN) 5-1.5 MG/5ML syrup Take 5 mLs by mouth every 6 (six) hours as needed for cough. 240 mL 0  . lidocaine-prilocaine (EMLA) cream APPLY TO THE AFFECTED AREA(S) AS NEEDED (Patient taking differently: Apply 1 application topically as needed [facility]). )  30 g 0  . linaclotide (LINZESS) 145 MCG CAPS capsule Take 1 capsule (145 mcg total) by mouth daily before breakfast. 30 capsule 6  . loperamide (IMODIUM) 1 MG/5ML solution Take 10 mLs (2 mg total) by mouth as needed for diarrhea or loose stools. (Patient taking differently: Take 2 mg by mouth daily as needed for diarrhea or loose stools. ) 120 mL 0  . loratadine (CLARITIN) 10 MG tablet Take 10 mg by mouth daily.    . magnesium oxide (MAG-OX) 400 (241.3 Mg) MG tablet Take 1 tablet (400 mg total) by mouth daily.    . methocarbamol (ROBAXIN) 500 MG tablet TAKE 1 TABLET (500 MG TOTAL) BY MOUTH EVERY 8 (EIGHT) HOURS AS NEEDED FOR MUSCLE SPASMS. 30 tablet 5  . nystatin cream (MYCOSTATIN) APPLY 1 APPLICATION TOPICALLY 2 (TWO) TIMES DAILY. (Patient taking differently: Apply 1 application topically daily as needed (skin irritaion). ) 30 g 0  . ondansetron (ZOFRAN) 8 MG tablet TAKE 1 TABLET BY MOUTH EVERY 8 HOURS AS NEEDED FOR NAUSEA OR VOMITING (Patient taking differently: Take 8 mg by mouth every 8 (eight) hours as needed for nausea or vomiting. ) 30 tablet 2  . osimertinib mesylate (TAGRISSO) 80 MG tablet Take 1 tablet (80 mg total) by mouth daily. 30 tablet 2  . oxyCODONE (OXY IR/ROXICODONE) 5 MG immediate release tablet Take 1 tablet (5 mg total) by mouth every 6 (six) hours as needed for severe pain. 30 tablet 0  . pantoprazole (PROTONIX) 40 MG tablet TAKE 1 TABLET (40 MG TOTAL) BY MOUTH DAILY AT 12 NOON. (Patient taking differently: Take 40 mg by mouth daily. ) 90 tablet 3  . potassium chloride SA (K-DUR) 20 MEQ tablet TAKE 1 TABLET (20 MEQ TOTAL) BY MOUTH 2 TIMES DAILY. (Patient taking differently: Take 20 mEq by mouth 2 (two) times daily. ) 180 tablet 3  . prochlorperazine (COMPAZINE) 10 MG tablet Take 1 tablet (10 mg total) by mouth every 6 (six) hours as needed for nausea or vomiting. 30 tablet 2  . senna-docusate (SENOKOT-S) 8.6-50 MG tablet Take 1 tablet by mouth at bedtime.    . traMADol (ULTRAM)  50 MG tablet Take 1 tablet (50 mg total) by mouth every 6 (six) hours as needed. 40 tablet 0   No current facility-administered medications for this visit.   Facility-Administered Medications Ordered in Other Visits  Medication Dose Route Frequency Provider Last Rate Last Admin  . sodium chloride flush (NS) 0.9 % injection 10 mL  10 mL Intracatheter PRN Curt Bears, MD   10 mL at 08/24/19 1235    SURGICAL HISTORY:  Past Surgical History:  Procedure Laterality Date  . ABDOMINAL HYSTERECTOMY     partial hysterectomy  . BREAST EXCISIONAL BIOPSY Left 20+ yrs ago   benign  . CHEST TUBE INSERTION Left 10/26/2018   Procedure: INSERTION PLEURAL DRAINAGE CATHETER;  Surgeon: Ivin Poot, MD;  Location: [place];  Service: Thoracic;  Laterality: Left;  . COLONOSCOPY    . IR IMAGING GUIDED PORT INSERTION  07/04/2019  . PERICARDIAL WINDOW N/A 01/10/2017   Procedure: PERICARDIAL WINDOW- SUB XYPHOID, RIGHT CHEST TUBE;  Surgeon: Ivin Poot, MD;  Location: [facility];  Service: Thoracic;  Laterality: N/A;  . PORT-A-CATH REMOVAL Left 05/10/2019   Procedure: REMOVAL PORT-A-CATH;  Surgeon: Ivin Poot, MD;  Location: [facility];  Service: Thoracic;  Laterality: Left;  . PORTACATH PLACEMENT Left 02/21/2019   Procedure: INSERTION PORT-A-CATH;  Surgeon: Ivin Poot, MD;  Location: [place];  Service: Thoracic;  Laterality: Left;  . REMOVAL OF PLEURAL DRAINAGE CATHETER Left 02/21/2019   Procedure: REMOVAL OF PLEURAL DRAINAGE CATHETER;  Surgeon: Ivin Poot, MD;  Location: [place];  Service: Thoracic;  Laterality: Left;  Marland Kitchen VIDEO BRONCHOSCOPY Bilateral 10/21/2016   Procedure: VIDEO BRONCHOSCOPY WITH FLUORO;  Surgeon: Tanda Rockers, MD;  Location: [facility];  Service: Cardiopulmonary;  Laterality: Bilateral;    REVIEW OF SYSTEMS:  Constitutional: positive for fatigue Eyes: negative Ears, nose, mouth, throat, and face: negative Respiratory: negative Cardiovascular: negative Gastrointestinal:  negative Genitourinary:negative Integument/breast: negative Hematologic/lymphatic: negative Musculoskeletal:negative Neurological: negative Behavioral/Psych: negative Endocrine: negative Allergic/Immunologic: negative   PHYSICAL EXAMINATION: General appearance: alert, cooperative, fatigued and no distress Head: Normocephalic, without obvious abnormality, atraumatic Neck: no adenopathy, no JVD, supple, symmetrical, trachea midline and thyroid not enlarged, symmetric, no tenderness/mass/nodules Lymph nodes: Cervical, supraclavicular, and axillary nodes normal. Resp: clear to auscultation bilaterally Back: symmetric, no curvature. ROM normal. No CVA tenderness. Cardio: regular rate and rhythm, S1, S2 normal, no murmur, click, rub or gallop GI: soft, non-tender; bowel sounds normal; no masses,  no organomegaly Extremities: extremities normal, atraumatic, no cyanosis or edema Neurologic: Alert and oriented X 3, normal strength and tone. Normal symmetric reflexes. Normal coordination and gait  ECOG PERFORMANCE STATUS: 1 - Symptomatic but completely ambulatory  Blood pressure 128/72, pulse 88, temperature 97.9 F (36.6 C), temperature source Temporal, resp. rate 18, height 5' 6"  (1.676 m), weight 193 lb (87.5 kg), last menstrual period 03/15/2010, SpO2 100 %.  LABORATORY DATA: Lab Results  Component Value Date   WBC 9.3 08/04/2019   HGB 10.4 (L) 08/04/2019   HCT 34.0 (L) 08/04/2019   MCV 104.3 (H) 08/04/2019   PLT 287 08/04/2019      Chemistry      Component Value Date/Time   NA 142 08/04/2019 1328   NA 135 (L) 01/08/2017 1055   K 3.9 08/04/2019 1328   K 3.2 (L) 01/08/2017 1055   CL 106 08/04/2019 1328   CO2 22 08/04/2019 1328   CO2 25 01/08/2017 1055   BUN 12 08/04/2019 1328   BUN 14.6 01/08/2017 1055   CREATININE 1.05 (H) 08/04/2019 1328   CREATININE 1.3 (H) 01/08/2017 1055      Component Value Date/Time   CALCIUM 9.7 08/04/2019 1328   CALCIUM 9.5 01/08/2017 1055    ALKPHOS 95 08/04/2019 1328   ALKPHOS 82 01/08/2017 1055   AST 21 08/04/2019 1328   AST 31 01/08/2017 1055   ALT 13 08/04/2019 1328   ALT 34 01/08/2017 1055   BILITOT 0.3 08/04/2019 1328   BILITOT 1.74 (H) 01/08/2017 1055       RADIOGRAPHIC STUDIES: CT Chest W Contrast  Result Date: 08/22/2019 CLINICAL DATA:  Stage IV lung cancer.  Restaging. EXAM: CT CHEST AND ABDOMEN WITH CONTRAST TECHNIQUE: Multidetector CT imaging of the chest, abdomen was performed following the standard protocol during bolus administration of intravenous contrast. CONTRAST:  135m OMNIPAQUE IOHEXOL 300 MG/ML  SOLN COMPARISON:  06/21/2019 FINDINGS: CT CHEST FINDINGS Cardiovascular: The heart size is normal. No substantial pericardial effusion. Atherosclerotic calcification is noted in the wall of the thoracic aorta. Right Port-A-Cath tip is positioned in the distal SVC. Mediastinum/Nodes: 9 mm short axis AP window lymph node measured previously is 10 mm short axis today. 8 mm short axis prevascular node seen on 20/2 today was 7 mm previously. There is no hilar lymphadenopathy. The esophagus has normal imaging features. Small axillary nodes, left greater than right, are similar to prior. Lungs/Pleura: The diffuse miliary nodularity in both lungs is similar to prior. Prominent nodule superior segment right lower lobe measured previously at 1.7 x 0.9 cm is 1.5 x 1.0 cm on image 42/4. Left lower lobe pulmonary nodule measured previously at 1.5 x 0.9 cm now measures 1.3 x 0.7 cm when measured in a similar fashion. 1.1 x 0.8 cm posterior left lower lobe nodule measured previously is 1.1 x 0.8 cm today (78/4). Similar small left pleural effusion. Musculoskeletal: No worrisome lytic or sclerotic osseous abnormality. CT ABDOMEN FINDINGS Hepatobiliary: No suspicious focal abnormality within the liver parenchyma. There is no evidence for gallstones, gallbladder wall thickening, or pericholecystic fluid. No intrahepatic or extrahepatic  biliary dilation. Pancreas: No focal mass lesion. No dilatation of the main duct. No intraparenchymal cyst. No peripancreatic edema. Spleen: No splenomegaly. No focal mass lesion. Adrenals/Urinary Tract: No adrenal nodule or mass. Kidneys unremarkable. Stomach/Bowel: Stomach is unremarkable. No gastric wall thickening. No evidence of outlet obstruction. Duodenum is normally positioned as is the ligament of Treitz. No small bowel or colonic dilatation within the visualized abdomen. Vascular/Lymphatic: There is abdominal aortic atherosclerosis without aneurysm. There is no gastrohepatic or hepatoduodenal ligament lymphadenopathy. No retroperitoneal or mesenteric lymphadenopathy. Other: No intraperitoneal free fluid. Musculoskeletal: No worrisome lytic or sclerotic osseous abnormality. IMPRESSION: 1. No substantial interval change in the diffuse miliary nodularity of both lungs. The dominant confluent nodules in each lung are similar in the interval. No new or progressive pulmonary nodularity. 2. Similar appearance of mild mediastinal lymphadenopathy. 3. Similar small left pleural effusion. 4. No evidence for metastatic disease in the abdomen. 5. Aortic Atherosclerosis (ICD10-I70.0). Electronically Signed   By: EMisty StanleyM.D.   On: 08/22/2019 16:27   CT Abdomen W Contrast  Result Date: 08/22/2019 CLINICAL DATA:  Stage IV lung cancer.  Restaging. EXAM: CT CHEST AND ABDOMEN WITH CONTRAST TECHNIQUE: Multidetector CT imaging of the chest, abdomen was performed following the standard protocol during bolus administration of intravenous contrast. CONTRAST:  1056mOMNIPAQUE IOHEXOL 300 MG/ML  SOLN COMPARISON:  06/21/2019 FINDINGS: CT CHEST FINDINGS Cardiovascular: The heart size is normal. No substantial pericardial effusion. Atherosclerotic calcification is noted in the wall of the thoracic aorta. Right Port-A-Cath tip is positioned in the distal SVC. Mediastinum/Nodes: 9 mm short axis AP window lymph node measured  previously is 10 mm short axis today. 8 mm short axis prevascular node seen on 20/2 today was 7 mm previously. There is no hilar lymphadenopathy. The esophagus has normal imaging features. Small axillary nodes, left greater than right, are similar to prior. Lungs/Pleura: The diffuse miliary nodularity in both lungs is similar to prior. Prominent nodule superior segment right lower lobe measured previously at 1.7 x 0.9 cm is 1.5 x 1.0 cm on image 42/4. Left lower lobe pulmonary nodule measured previously at 1.5 x 0.9 cm now measures 1.3 x 0.7 cm when measured in a similar fashion. 1.1 x 0.8 cm posterior left lower  lobe nodule measured previously is 1.1 x 0.8 cm today (78/4). Similar small left pleural effusion. Musculoskeletal: No worrisome lytic or sclerotic osseous abnormality. CT ABDOMEN FINDINGS Hepatobiliary: No suspicious focal abnormality within the liver parenchyma. There is no evidence for gallstones, gallbladder wall thickening, or pericholecystic fluid. No intrahepatic or extrahepatic biliary dilation. Pancreas: No focal mass lesion. No dilatation of the main duct. No intraparenchymal cyst. No peripancreatic edema. Spleen: No splenomegaly. No focal mass lesion. Adrenals/Urinary Tract: No adrenal nodule or mass. Kidneys unremarkable. Stomach/Bowel: Stomach is unremarkable. No gastric wall thickening. No evidence of outlet obstruction. Duodenum is normally positioned as is the ligament of Treitz. No small bowel or colonic dilatation within the visualized abdomen. Vascular/Lymphatic: There is abdominal aortic atherosclerosis without aneurysm. There is no gastrohepatic or hepatoduodenal ligament lymphadenopathy. No retroperitoneal or mesenteric lymphadenopathy. Other: No intraperitoneal free fluid. Musculoskeletal: No worrisome lytic or sclerotic osseous abnormality. IMPRESSION: 1. No substantial interval change in the diffuse miliary nodularity of both lungs. The dominant confluent nodules in each lung are  similar in the interval. No new or progressive pulmonary nodularity. 2. Similar appearance of mild mediastinal lymphadenopathy. 3. Similar small left pleural effusion. 4. No evidence for metastatic disease in the abdomen. 5. Aortic Atherosclerosis (ICD10-I70.0). Electronically Signed   By: Misty Stanley M.D.   On: 08/22/2019 16:27    ASSESSMENT AND PLAN: This is a very pleasant 54 years old African-American female with metastatic non-small cell lung cancer, adenocarcinoma and positive for EGFR mutation with deletion in exon 19.  The patient is currently on treatment with GILOTRIF 40 mg p.o. daily status post 13 months.  The patient is tolerating this treatment well except for few episodes of diarrhea and mild skin rash. She is complaining of dizzy spells and vertigo recently. Repeat CT scan of the chest, abdomen and pelvis performed recently showed some evidence for disease progression with increase in size and number of pulmonary nodules. Her treatment was switched to Tagrisso 80 mg p.o. daily started January 01, 2018.  She is status post 12 months of treatment She had repeat MRI of the brain as well as CT scan of the chest, abdomen pelvis performed recently. Her MRI of the brain showed no concerning findings for disease progression in the brain but CT scan of the chest showed some evidence for progression with lymphangitic spread of the disease. She was referred to Dr. Durenda Hurt at [facility] and repeat molecular studies showed the emergence of BRAF mutation V600E. I recommended for the patient to consider systemic chemotherapy and she is here today for evaluation and discussion of this option.  I had a lengthy discussion with the patient today regarding her condition.  She understands that a combination of BRAF inhibitor and Tagrisso has not been well studied at this point. She is currently undergoing systemic chemotherapy with carboplatin for AUC of 5, Alimta 500 mg/M2 and Avastin 15 mg/KG  every 3 weeks, status post 9 cycles.  Starting from cycle #7 she is on maintenance treatment with Alimta and Avastin every 3 weeks. She also continues her current treatment with Tagrisso 80 mg p.o. daily. She had repeat CT scan of the chest, abdomen pelvis performed recently.  I personally and independently reviewed the scans and discussed the results with the patient today. Her scan showed no evidence for disease progression. I recommended for the patient to continue her current maintenance treatment with Alimta and Avastin and she will proceed with cycle #10 today. For the history of metastatic disease to the  brain.  She is scheduled to have MRI of the brain tomorrow for further evaluation of her condition. The patient will come back for follow-up visit in 3 weeks for evaluation before starting cycle #11. She was advised to call immediately if she has any concerning symptoms in the interval. The patient voices understanding of current disease status and treatment options and is in agreement with the current care plan. All questions were answered. The patient knows to call the clinic with any problems, questions or concerns. We can certainly see the patient much sooner if necessary.  Disclaimer: This note was dictated with voice recognition software. Similar sounding words can inadvertently be transcribed and may not be corrected upon review.

## 2019-08-24 NOTE — Patient Instructions (Signed)

## 2019-08-25 ENCOUNTER — Ambulatory Visit (HOSPITAL_COMMUNITY)
Admission: RE | Admit: 2019-08-25 | Discharge: 2019-08-25 | Disposition: A | Discharge status: Home / Self Care | Payer: Medicare Other | Source: Ambulatory Visit | Attending: Physician Assistant | Admitting: Physician Assistant

## 2019-08-25 ENCOUNTER — Other Ambulatory Visit: Payer: Self-pay

## 2019-08-25 ENCOUNTER — Inpatient Hospital Stay: Payer: Medicare Other

## 2019-08-25 DIAGNOSIS — R21 Rash and other nonspecific skin eruption: Secondary | ICD-10-CM | POA: Diagnosis not present

## 2019-08-25 DIAGNOSIS — R253 Fasciculation: Secondary | ICD-10-CM | POA: Diagnosis not present

## 2019-08-25 DIAGNOSIS — C3492 Malignant neoplasm of unspecified part of left bronchus or lung: Secondary | ICD-10-CM

## 2019-08-25 DIAGNOSIS — C3490 Malignant neoplasm of unspecified part of unspecified bronchus or lung: Secondary | ICD-10-CM | POA: Diagnosis not present

## 2019-08-25 DIAGNOSIS — Z5112 Encounter for antineoplastic immunotherapy: Secondary | ICD-10-CM | POA: Diagnosis not present

## 2019-08-25 DIAGNOSIS — Z452 Encounter for adjustment and management of vascular access device: Secondary | ICD-10-CM | POA: Diagnosis not present

## 2019-08-25 DIAGNOSIS — Z95828 Presence of other vascular implants and grafts: Secondary | ICD-10-CM

## 2019-08-25 DIAGNOSIS — Z5111 Encounter for antineoplastic chemotherapy: Secondary | ICD-10-CM | POA: Diagnosis not present

## 2019-08-25 DIAGNOSIS — K5090 Crohn's disease, unspecified, without complications: Secondary | ICD-10-CM | POA: Diagnosis not present

## 2019-08-25 MED ORDER — SODIUM CHLORIDE 0.9% FLUSH
10.0000 mL | INTRAVENOUS | Status: DC | PRN
  Administered 2019-08-25: 10 mL
  Filled 2019-08-25: qty 10

## 2019-08-25 MED ORDER — GADOBUTROL 1 MMOL/ML IV SOLN
9.0000 mL | Freq: Once | INTRAVENOUS | Status: AC | PRN
  Administered 2019-08-25: 9 mL via INTRAVENOUS

## 2019-08-25 MED ORDER — HEPARIN SOD (PORK) LOCK FLUSH 100 UNIT/ML IV SOLN
500.0000 [IU] | Freq: Once | INTRAVENOUS | Status: AC | PRN
  Administered 2019-08-25: 500 [IU]
  Filled 2019-08-25: qty 5

## 2019-08-29 ENCOUNTER — Other Ambulatory Visit: Payer: Self-pay | Admitting: Internal Medicine

## 2019-08-29 ENCOUNTER — Telehealth: Payer: Self-pay | Admitting: Medical Oncology

## 2019-08-29 MED ORDER — FENTANYL 25 MCG/HR TD PT72: 1.0000 | MEDICATED_PATCH | TRANSDERMAL | 0 refills | Status: DC

## 2019-08-29 MED FILL — fentaNYL 25 MCG/HR PT72: 25 | 15 days supply | Qty: 5 | Fill #0

## 2019-08-29 NOTE — Telephone Encounter (Signed)
uncontrolled pain across upper back and  both shoulder blades and feels like a belt going  around to ribs in front.  Oxycodone is taking longer to work.  She took oxycodone 5 at 1p.She is still having pain and cannot get relief.  Per Julien Nordmann I instructed pt to take an Oxycodone now and then go back to q 6 hours prn. Dr Julien Nordmann sent fentanyl patch to [facility].

## 2019-08-29 NOTE — Telephone Encounter (Signed)
Hoarseness/Cough ( Sprayed pesticide over the weekend and used fan to circulate it.) Pain increasing since Saturday-across the top of the back. Oxycodone ATC q 6 hours and helps and takes a little longer to kick it.   She said she thinks she just needs to monitor symptoms for now. I told her to monitor for now and call if symptoms worsen.

## 2019-09-09 MED FILL — DEXAMETHASONE 4 MG TABLET: 4 | 21 days supply | Qty: 6 | Fill #9

## 2019-09-14 ENCOUNTER — Inpatient Hospital Stay: Payer: Medicare Other

## 2019-09-14 ENCOUNTER — Other Ambulatory Visit: Payer: Self-pay | Admitting: Family Medicine

## 2019-09-14 ENCOUNTER — Inpatient Hospital Stay: Payer: Medicare Other | Attending: Oncology

## 2019-09-14 ENCOUNTER — Other Ambulatory Visit: Payer: Self-pay

## 2019-09-14 ENCOUNTER — Encounter: Payer: Self-pay | Admitting: Internal Medicine

## 2019-09-14 ENCOUNTER — Other Ambulatory Visit (HOSPITAL_COMMUNITY): Payer: Self-pay | Admitting: Family Medicine

## 2019-09-14 ENCOUNTER — Inpatient Hospital Stay (HOSPITAL_BASED_OUTPATIENT_CLINIC_OR_DEPARTMENT_OTHER): Payer: Medicare Other | Admitting: Internal Medicine

## 2019-09-14 VITALS — BP 118/88 | HR 84 | Temp 97.6°F | Resp 17 | Ht 66.0 in | Wt 191.0 lb

## 2019-09-14 VITALS — BP 122/71

## 2019-09-14 DIAGNOSIS — C3490 Malignant neoplasm of unspecified part of unspecified bronchus or lung: Secondary | ICD-10-CM

## 2019-09-14 DIAGNOSIS — C7931 Secondary malignant neoplasm of brain: Secondary | ICD-10-CM

## 2019-09-14 DIAGNOSIS — C3492 Malignant neoplasm of unspecified part of left bronchus or lung: Secondary | ICD-10-CM | POA: Insufficient documentation

## 2019-09-14 DIAGNOSIS — R490 Dysphonia: Secondary | ICD-10-CM | POA: Insufficient documentation

## 2019-09-14 DIAGNOSIS — R21 Rash and other nonspecific skin eruption: Secondary | ICD-10-CM | POA: Diagnosis not present

## 2019-09-14 DIAGNOSIS — Z5111 Encounter for antineoplastic chemotherapy: Secondary | ICD-10-CM | POA: Insufficient documentation

## 2019-09-14 DIAGNOSIS — Z5112 Encounter for antineoplastic immunotherapy: Secondary | ICD-10-CM | POA: Insufficient documentation

## 2019-09-14 DIAGNOSIS — I10 Essential (primary) hypertension: Secondary | ICD-10-CM

## 2019-09-14 DIAGNOSIS — K5090 Crohn's disease, unspecified, without complications: Secondary | ICD-10-CM | POA: Diagnosis not present

## 2019-09-14 DIAGNOSIS — R609 Edema, unspecified: Secondary | ICD-10-CM

## 2019-09-14 DIAGNOSIS — K219 Gastro-esophageal reflux disease without esophagitis: Secondary | ICD-10-CM

## 2019-09-14 LAB — CBC WITH DIFFERENTIAL (CANCER CENTER ONLY)
Abs Immature Granulocytes: 0.01 10*3/uL (ref 0.00–0.07)
Basophils Absolute: 0 10*3/uL (ref 0.0–0.1)
Basophils Relative: 0 %
Eosinophils Absolute: 0 10*3/uL (ref 0.0–0.5)
Eosinophils Relative: 0 %
HCT: 34.7 % — ABNORMAL LOW (ref 36.0–46.0)
Hemoglobin: 10.7 g/dL — ABNORMAL LOW (ref 12.0–15.0)
Immature Granulocytes: 0 %
Lymphocytes Relative: 19 %
Lymphs Abs: 1.6 10*3/uL (ref 0.7–4.0)
MCH: 31.4 pg (ref 26.0–34.0)
MCHC: 30.8 g/dL (ref 30.0–36.0)
MCV: 101.8 fL — ABNORMAL HIGH (ref 80.0–100.0)
Monocytes Absolute: 1 10*3/uL (ref 0.1–1.0)
Monocytes Relative: 12 %
Neutro Abs: 5.7 10*3/uL (ref 1.7–7.7)
Neutrophils Relative %: 69 %
Platelet Count: 307 10*3/uL (ref 150–400)
RBC: 3.41 MIL/uL — ABNORMAL LOW (ref 3.87–5.11)
RDW: 15.2 % (ref 11.5–15.5)
WBC Count: 8.4 10*3/uL (ref 4.0–10.5)
nRBC: 0 % (ref 0.0–0.2)

## 2019-09-14 LAB — CMP (CANCER CENTER ONLY)
ALT: 7 U/L (ref 0–44)
AST: 19 U/L (ref 15–41)
Albumin: 3.4 g/dL — ABNORMAL LOW (ref 3.5–5.0)
Alkaline Phosphatase: 79 U/L (ref 38–126)
Anion gap: 11 (ref 5–15)
BUN: 16 mg/dL (ref 6–20)
CO2: 24 mmol/L (ref 22–32)
Calcium: 10.3 mg/dL (ref 8.9–10.3)
Chloride: 104 mmol/L (ref 98–111)
Creatinine: 1.24 mg/dL — ABNORMAL HIGH (ref 0.44–1.00)
GFR, Est AFR Am: 57 mL/min — ABNORMAL LOW
GFR, Estimated: 50 mL/min — ABNORMAL LOW
Glucose, Bld: 129 mg/dL — ABNORMAL HIGH (ref 70–99)
Potassium: 4 mmol/L (ref 3.5–5.1)
Sodium: 139 mmol/L (ref 135–145)
Total Bilirubin: 0.3 mg/dL (ref 0.3–1.2)
Total Protein: 7.3 g/dL (ref 6.5–8.1)

## 2019-09-14 LAB — TOTAL PROTEIN, URINE DIPSTICK: Protein, ur: 30 mg/dL — AB

## 2019-09-14 MED ORDER — PROCHLORPERAZINE MALEATE 10 MG PO TABS
10.0000 mg | ORAL_TABLET | Freq: Once | ORAL | Status: AC
  Administered 2019-09-14: 10 mg via ORAL

## 2019-09-14 MED ORDER — HEPARIN SOD (PORK) LOCK FLUSH 100 UNIT/ML IV SOLN
500.0000 [IU] | Freq: Once | INTRAVENOUS | Status: AC | PRN
  Administered 2019-09-14: 500 [IU]
  Filled 2019-09-14: qty 5

## 2019-09-14 MED ORDER — PROCHLORPERAZINE MALEATE 10 MG PO TABS
ORAL_TABLET | ORAL | Status: AC
  Filled 2019-09-14: qty 1

## 2019-09-14 MED ORDER — SODIUM CHLORIDE 0.9 % IV SOLN
Freq: Once | INTRAVENOUS | Status: AC
  Filled 2019-09-14: qty 250

## 2019-09-14 MED ORDER — SODIUM CHLORIDE 0.9 % IV SOLN
15.0000 mg/kg | Freq: Once | INTRAVENOUS | Status: AC
  Administered 2019-09-14: 1300 mg via INTRAVENOUS
  Filled 2019-09-14: qty 48

## 2019-09-14 MED ORDER — SODIUM CHLORIDE 0.9% FLUSH
10.0000 mL | INTRAVENOUS | Status: DC | PRN
  Administered 2019-09-14: 10 mL
  Filled 2019-09-14: qty 10

## 2019-09-14 MED ORDER — SODIUM CHLORIDE 0.9 % IV SOLN
400.0000 mg/m2 | Freq: Once | INTRAVENOUS | Status: AC
  Administered 2019-09-14: 800 mg via INTRAVENOUS
  Filled 2019-09-14: qty 20

## 2019-09-14 MED FILL — LINZESS 145 MCG CAPSULE: 145 | 30 days supply | Qty: 30 | Fill #3

## 2019-09-14 MED FILL — POTASSIUM CHLORIDE CRYS ER: 20 | 30 days supply | Qty: 60 | Fill #0

## 2019-09-14 MED FILL — PANTOPRAZOLE SOD DR 40 MG T: 40 | 30 days supply | Qty: 30 | Fill #0

## 2019-09-14 MED FILL — FUROSEMIDE 40 MG TAB: 40 | 30 days supply | Qty: 30 | Fill #1

## 2019-09-14 MED FILL — FLUTICASONE PROP 50 MCG SPR: 50 | 30 days supply | Qty: 16 | Fill #2

## 2019-09-14 MED FILL — TAGRISSO 80 MG TABLET: 80 | 30 days supply | Qty: 30 | Fill #2

## 2019-09-14 MED FILL — FOLIC ACID 1 MG TABS: 1 | 30 days supply | Qty: 30 | Fill #2

## 2019-09-14 NOTE — Progress Notes (Signed)
[facility] [phone]   [phone]  OFFICE PROGRESS NOTE  Nancy Moore, Nancy Filler, MD [address] [place] [address]  DIAGNOSIS: DIAGNOSIS: stage IV (T3, N2, M1 a) non-small cell lung cancer, poorly differentiated adenocarcinoma with extensive miliary distribution in the lungs bilaterally diagnosed in September 2018. 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 NEGATIVE for the Exon 20 T790M mutation. Repeat molecular studies by guardant 360 at time of progression showed ATM [id number] [facility] of BRAF mutation, V600E  PRIOR THERAPY:  1) Gilotrif 40 mg daily started 11/08/2016.Status post 13 months of treatment. 2) Tagrisso 80 mg p.o. daily.  First dose started January 01, 2018.  Status post 18 months of treatment.   CURRENT THERAPY: 1) systemic chemotherapy with carboplatin for AUC 5, Alimta 500 mg/M2 and Avastin 15 mg/KG every 3 weeks.  First dose February 10, 2018.  Status post 10 cycles.  Starting from cycle #7 she will be on maintenance treatment with Alimta and Avastin in addition to her treatment with Tagrisso.  INTERVAL HISTORY: Nancy EvesBest 54y.o. female returns to the clinic today for follow-up visit.  The patient is feeling fine today with no concerning complaints except for mild hoarseness of her voice that started 3 weeks ago after spraying.  She denied having any current chest pain, shortness of breath, cough or hemoptysis.  She denied having any fever or chills.  She has no nausea, vomiting, diarrhea or constipation.  She continues to tolerate her maintenance treatment with Alimta and Avastin fairly well except for fatigue for few days after the treatment.  The patient is here today for evaluation before starting cycle #11.  MEDICAL HISTORY: Past Medical History:  Diagnosis Date  . Adenocarcinoma of left lung, stage 4 ([facility]  10/28/2016  . Anemia   . Arthritis   . Constipation   . Crohn's disease ([facility]    in remission 35 years +  . Dyspnea    gets "winded"  . GERD (gastroesophageal reflux disease)   . Goals of care, counseling/discussion 10/28/2016  . History of hiatal hernia    noted on CT 12/20  . Hypertension   . Pneumonia    walking pneumonia in the late 80's  . Recurrent pleural effusion on left     ALLERGIES:  is allergic to percocet [oxycodone-acetaminophen], lisinopril, and losartan.  MEDICATIONS:  Current Outpatient Medications  Medication Sig Dispense Refill  . albuterol (VENTOLIN HFA) 108 (90 Base) MCG/ACT inhaler Inhale 2 puffs into the lungs every 6 (six) hours as needed for wheezing or shortness of breath. 8 g 1  . clindamycin (CLINDAGEL) 1 % gel Apply 1 application topically 2 (two) times daily. 30 g 1  . dexamethasone (DECADRON) 4 MG tablet 1 tablet p.o. twice daily the day before, day of and day after chemotherapy every 3 weeks (Patient taking differently: Take 4 mg by mouth See admin instructions. 4 mg  twice daily the day before, day of and day after chemotherapy every 3 weeks) 20 tablet 2  . fentaNYL (DURAGESIC) 25 MCG/HR Place 1 patch onto the skin every 3 (three) days. 5 patch 0  . fluticasone (FLONASE) 50 MCG/ACT nasal spray Place 2 sprays into both nostrils daily as needed for allergies. 16 g 3  . folic acid (FOLVITE) 1 MG tablet TAKE 1 TABLET BY MOUTH ONCE A DAY 30 tablet 4  . furosemide (  LASIX) 40 MG tablet Take 1 tablet (40 mg total) by mouth daily. 30 tablet 3  . guaiFENesin (MUCINEX) 600 MG 12 hr tablet Take 600 mg by mouth 2 (two) times daily as needed for to loosen phlegm.     Marland Kitchen HYDROcodone-homatropine (HYCODAN) 5-1.5 MG/5ML syrup Take 5 mLs by mouth every 6 (six) hours as needed for cough. 240 mL 0  . lidocaine-prilocaine (EMLA) cream APPLY TO THE AFFECTED AREA(S) AS NEEDED 30 g 0  . linaclotide (LINZESS) 145 MCG CAPS capsule Take 1 capsule (145 mcg total) by mouth daily  before breakfast. 30 capsule 6  . loperamide (IMODIUM) 1 MG/5ML solution Take 10 mLs (2 mg total) by mouth as needed for diarrhea or loose stools. (Patient taking differently: Take 2 mg by mouth daily as needed for diarrhea or loose stools. ) 120 mL 0  . loratadine (CLARITIN) 10 MG tablet Take 10 mg by mouth daily.    . magnesium oxide (MAG-OX) 400 (241.3 Mg) MG tablet Take 1 tablet (400 mg total) by mouth daily.    . methocarbamol (ROBAXIN) 500 MG tablet TAKE 1 TABLET (500 MG TOTAL) BY MOUTH EVERY 8 (EIGHT) HOURS AS NEEDED FOR MUSCLE SPASMS. 30 tablet 5  . nystatin cream (MYCOSTATIN) APPLY 1 APPLICATION TOPICALLY 2 (TWO) TIMES DAILY. (Patient taking differently: Apply 1 application topically daily as needed (skin irritaion). ) 30 g 0  . ondansetron (ZOFRAN) 8 MG tablet TAKE 1 TABLET BY MOUTH EVERY 8 HOURS AS NEEDED FOR NAUSEA OR VOMITING (Patient taking differently: Take 8 mg by mouth every 8 (eight) hours as needed for nausea or vomiting. ) 30 tablet 2  . osimertinib mesylate (TAGRISSO) 80 MG tablet Take 1 tablet (80 mg total) by mouth daily. 30 tablet 2  . oxyCODONE (OXY IR/ROXICODONE) 5 MG immediate release tablet TAKE 1 TABLET BY MOUTH EVERY 6 HOURS AS NEEDED FOR PAIN 30 tablet 0  . pantoprazole (PROTONIX) 40 MG tablet Take 1 tablet (40 mg total) by mouth daily. 30 tablet 5  . potassium chloride SA (KLOR-CON) 20 MEQ tablet Take 1 tablet (20 mEq total) by mouth 2 (two) times daily. 60 tablet 5  . prochlorperazine (COMPAZINE) 10 MG tablet Take 1 tablet (10 mg total) by mouth every 6 (six) hours as needed for nausea or vomiting. 30 tablet 2  . senna-docusate (SENOKOT-S) 8.6-50 MG tablet Take 1 tablet by mouth at bedtime.    . traMADol (ULTRAM) 50 MG tablet Take 1 tablet (50 mg total) by mouth every 6 (six) hours as needed. 40 tablet 0   No current facility-administered medications for this visit.    SURGICAL HISTORY:  Past Surgical History:  Procedure Laterality Date  . ABDOMINAL HYSTERECTOMY      partial hysterectomy  . BREAST EXCISIONAL BIOPSY Left 20+ yrs ago   benign  . CHEST TUBE INSERTION Left 10/26/2018   Procedure: INSERTION PLEURAL DRAINAGE CATHETER;  Surgeon: Ivin Poot, MD;  Location: [facility];  Service: Thoracic;  Laterality: Left;  . COLONOSCOPY    . IR IMAGING GUIDED PORT INSERTION  07/04/2019  . PERICARDIAL WINDOW N/A 01/10/2017   Procedure: PERICARDIAL WINDOW- SUB XYPHOID, RIGHT CHEST TUBE;  Surgeon: Ivin Poot, MD;  Location: [facility];  Service: Thoracic;  Laterality: N/A;  . PORT-A-CATH REMOVAL Left 05/10/2019   Procedure: REMOVAL PORT-A-CATH;  Surgeon: Ivin Poot, MD;  Location: [facility];  Service: Thoracic;  Laterality: Left;  . PORTACATH PLACEMENT Left 02/21/2019   Procedure: INSERTION PORT-A-CATH;  Surgeon: Prescott Gum,  Collier Salina, MD;  Location: [facility];  Service: Thoracic;  Laterality: Left;  . REMOVAL OF PLEURAL DRAINAGE CATHETER Left 02/21/2019   Procedure: REMOVAL OF PLEURAL DRAINAGE CATHETER;  Surgeon: Ivin Poot, MD;  Location: [facility];  Service: Thoracic;  Laterality: Left;  Marland Kitchen VIDEO BRONCHOSCOPY Bilateral 10/21/2016   Procedure: VIDEO BRONCHOSCOPY WITH FLUORO;  Surgeon: Tanda Rockers, MD;  Location: [facility];  Service: Cardiopulmonary;  Laterality: Bilateral;    REVIEW OF SYSTEMS:  A comprehensive review of systems was negative except for: Constitutional: positive for fatigue Ears, nose, mouth, throat, and face: positive for hoarseness   PHYSICAL EXAMINATION: General appearance: alert, cooperative, fatigued and no distress Head: Normocephalic, without obvious abnormality, atraumatic Neck: no adenopathy, no JVD, supple, symmetrical, trachea midline and thyroid not enlarged, symmetric, no tenderness/mass/nodules Lymph nodes: Cervical, supraclavicular, and axillary nodes normal. Resp: clear to auscultation bilaterally Back: symmetric, no curvature. ROM normal. No CVA tenderness. Cardio: regular rate and rhythm, S1, S2 normal, no murmur, click, rub  or gallop GI: soft, non-tender; bowel sounds normal; no masses,  no organomegaly Extremities: extremities normal, atraumatic, no cyanosis or edema  ECOG PERFORMANCE STATUS: 1 - Symptomatic but completely ambulatory  Blood pressure 118/88, pulse 84, temperature 97.6 F (36.4 C), temperature source Tympanic, resp. rate 17, height 5' 6"  (1.676 m), weight 191 lb (86.6 kg), last menstrual period 03/15/2010, SpO2 100 %.  LABORATORY DATA: Lab Results  Component Value Date   WBC 9.8 08/24/2019   HGB 10.8 (L) 08/24/2019   HCT 35.0 (L) 08/24/2019   MCV 104.5 (H) 08/24/2019   PLT 287 08/24/2019      Chemistry      Component Value Date/Time   NA 142 08/24/2019 1233   NA 135 (L) 01/08/2017 1055   K 3.6 08/24/2019 1233   K 3.2 (L) 01/08/2017 1055   CL 103 08/24/2019 1233   CO2 28 08/24/2019 1233   CO2 25 01/08/2017 1055   BUN 15 08/24/2019 1233   BUN 14.6 01/08/2017 1055   CREATININE 1.21 (H) 08/24/2019 1233   CREATININE 1.3 (H) 01/08/2017 1055      Component Value Date/Time   CALCIUM 10.3 08/24/2019 1233   CALCIUM 9.5 01/08/2017 1055   ALKPHOS 78 08/24/2019 1233   ALKPHOS 82 01/08/2017 1055   AST 23 08/24/2019 1233   AST 31 01/08/2017 1055   ALT 11 08/24/2019 1233   ALT 34 01/08/2017 1055   BILITOT 0.3 08/24/2019 1233   BILITOT 1.74 (H) 01/08/2017 1055       RADIOGRAPHIC STUDIES: CT Chest W Contrast  Result Date: 08/22/2019 CLINICAL DATA:  Stage IV lung cancer.  Restaging. EXAM: CT CHEST AND ABDOMEN WITH CONTRAST TECHNIQUE: Multidetector CT imaging of the chest, abdomen was performed following the standard protocol during bolus administration of intravenous contrast. CONTRAST:  142m OMNIPAQUE IOHEXOL 300 MG/ML  SOLN COMPARISON:  06/21/2019 FINDINGS: CT CHEST FINDINGS Cardiovascular: The heart size is normal. No substantial pericardial effusion. Atherosclerotic calcification is noted in the wall of the thoracic aorta. Right Port-A-Cath tip is positioned in the distal SVC.  Mediastinum/Nodes: 9 mm short axis AP window lymph node measured previously is 10 mm short axis today. 8 mm short axis prevascular node seen on 20/2 today was 7 mm previously. There is no hilar lymphadenopathy. The esophagus has normal imaging features. Small axillary nodes, left greater than right, are similar to prior. Lungs/Pleura: The diffuse miliary nodularity in both lungs is similar to prior. Prominent nodule superior segment right lower lobe measured previously  at 1.7 x 0.9 cm is 1.5 x 1.0 cm on image 42/4. Left lower lobe pulmonary nodule measured previously at 1.5 x 0.9 cm now measures 1.3 x 0.7 cm when measured in a similar fashion. 1.1 x 0.8 cm posterior left lower lobe nodule measured previously is 1.1 x 0.8 cm today (78/4). Similar small left pleural effusion. Musculoskeletal: No worrisome lytic or sclerotic osseous abnormality. CT ABDOMEN FINDINGS Hepatobiliary: No suspicious focal abnormality within the liver parenchyma. There is no evidence for gallstones, gallbladder wall thickening, or pericholecystic fluid. No intrahepatic or extrahepatic biliary dilation. Pancreas: No focal mass lesion. No dilatation of the main duct. No intraparenchymal cyst. No peripancreatic edema. Spleen: No splenomegaly. No focal mass lesion. Adrenals/Urinary Tract: No adrenal nodule or mass. Kidneys unremarkable. Stomach/Bowel: Stomach is unremarkable. No gastric wall thickening. No evidence of outlet obstruction. Duodenum is normally positioned as is the ligament of Treitz. No small bowel or colonic dilatation within the visualized abdomen. Vascular/Lymphatic: There is abdominal aortic atherosclerosis without aneurysm. There is no gastrohepatic or hepatoduodenal ligament lymphadenopathy. No retroperitoneal or mesenteric lymphadenopathy. Other: No intraperitoneal free fluid. Musculoskeletal: No worrisome lytic or sclerotic osseous abnormality. IMPRESSION: 1. No substantial interval change in the diffuse miliary nodularity  of both lungs. The dominant confluent nodules in each lung are similar in the interval. No new or progressive pulmonary nodularity. 2. Similar appearance of mild mediastinal lymphadenopathy. 3. Similar small left pleural effusion. 4. No evidence for metastatic disease in the abdomen. 5. Aortic Atherosclerosis (ICD10-I70.0). Electronically Signed   By: Misty Stanley M.D.   On: 08/22/2019 16:27   CT Abdomen W Contrast  Result Date: 08/22/2019 CLINICAL DATA:  Stage IV lung cancer.  Restaging. EXAM: CT CHEST AND ABDOMEN WITH CONTRAST TECHNIQUE: Multidetector CT imaging of the chest, abdomen was performed following the standard protocol during bolus administration of intravenous contrast. CONTRAST:  165m OMNIPAQUE IOHEXOL 300 MG/ML  SOLN COMPARISON:  06/21/2019 FINDINGS: CT CHEST FINDINGS Cardiovascular: The heart size is normal. No substantial pericardial effusion. Atherosclerotic calcification is noted in the wall of the thoracic aorta. Right Port-A-Cath tip is positioned in the distal SVC. Mediastinum/Nodes: 9 mm short axis AP window lymph node measured previously is 10 mm short axis today. 8 mm short axis prevascular node seen on 20/2 today was 7 mm previously. There is no hilar lymphadenopathy. The esophagus has normal imaging features. Small axillary nodes, left greater than right, are similar to prior. Lungs/Pleura: The diffuse miliary nodularity in both lungs is similar to prior. Prominent nodule superior segment right lower lobe measured previously at 1.7 x 0.9 cm is 1.5 x 1.0 cm on image 42/4. Left lower lobe pulmonary nodule measured previously at 1.5 x 0.9 cm now measures 1.3 x 0.7 cm when measured in a similar fashion. 1.1 x 0.8 cm posterior left lower lobe nodule measured previously is 1.1 x 0.8 cm today (78/4). Similar small left pleural effusion. Musculoskeletal: No worrisome lytic or sclerotic osseous abnormality. CT ABDOMEN FINDINGS Hepatobiliary: No suspicious focal abnormality within the liver  parenchyma. There is no evidence for gallstones, gallbladder wall thickening, or pericholecystic fluid. No intrahepatic or extrahepatic biliary dilation. Pancreas: No focal mass lesion. No dilatation of the main duct. No intraparenchymal cyst. No peripancreatic edema. Spleen: No splenomegaly. No focal mass lesion. Adrenals/Urinary Tract: No adrenal nodule or mass. Kidneys unremarkable. Stomach/Bowel: Stomach is unremarkable. No gastric wall thickening. No evidence of outlet obstruction. Duodenum is normally positioned as is the ligament of Treitz. No small bowel or colonic dilatation within the  visualized abdomen. Vascular/Lymphatic: There is abdominal aortic atherosclerosis without aneurysm. There is no gastrohepatic or hepatoduodenal ligament lymphadenopathy. No retroperitoneal or mesenteric lymphadenopathy. Other: No intraperitoneal free fluid. Musculoskeletal: No worrisome lytic or sclerotic osseous abnormality. IMPRESSION: 1. No substantial interval change in the diffuse miliary nodularity of both lungs. The dominant confluent nodules in each lung are similar in the interval. No new or progressive pulmonary nodularity. 2. Similar appearance of mild mediastinal lymphadenopathy. 3. Similar small left pleural effusion. 4. No evidence for metastatic disease in the abdomen. 5. Aortic Atherosclerosis (ICD10-I70.0). Electronically Signed   By: Misty Stanley M.D.   On: 08/22/2019 16:27   MR Brain W Wo Contrast  Result Date: 08/26/2019 CLINICAL DATA:  Lung cancer staging EXAM: MRI HEAD WITHOUT AND WITH CONTRAST TECHNIQUE: Multiplanar, multiecho pulse sequences of the brain and surrounding structures were obtained without and with intravenous contrast. CONTRAST:  96m GADAVIST GADOBUTROL 1 MMOL/ML IV SOLN COMPARISON:  06/22/2019 FINDINGS: Brain: No abnormal intracranial enhancement or swelling. Numerous tiny foci of remote microhemorrhage correlating with widespread intracranial metastatic disease on a 2019 brain MRI.  No hemorrhage, hydrocephalus, or infarct. Brain volume is normal Vascular: Normal flow voids and vascular enhancements Skull and upper cervical spine: Normal marrow signal Sinuses/Orbits: Negative IMPRESSION: Stable exam.  No evidence of active disease. Electronically Signed   By: JMonte FantasiaM.D.   On: 08/26/2019 08:24    ASSESSMENT AND PLAN: This is a very pleasant 54years old African-American female with metastatic non-small cell lung cancer, adenocarcinoma and positive for EGFR mutation with deletion in exon 19.  The patient is currently on treatment with GILOTRIF 40 mg p.o. daily status post 13 months.  The patient is tolerating this treatment well except for few episodes of diarrhea and mild skin rash. She is complaining of dizzy spells and vertigo recently. Repeat CT scan of the chest, abdomen and pelvis performed recently showed some evidence for disease progression with increase in size and number of pulmonary nodules. Her treatment was switched to Tagrisso 80 mg p.o. daily started January 01, 2018.  She is status post 12 months of treatment She had repeat MRI of the brain as well as CT scan of the chest, abdomen pelvis performed recently. Her MRI of the brain showed no concerning findings for disease progression in the brain but CT scan of the chest showed some evidence for progression with lymphangitic spread of the disease. [facility] showed the emergence of BRAF mutation V600E. I recommended for the patient to consider systemic chemotherapy and she is here today for evaluation and discussion of this option.  I had a lengthy discussion with the patient today regarding her condition.  She understands that a combination of BRAF inhibitor and Tagrisso has not been well studied at this point. She is currently undergoing systemic chemotherapy with carboplatin for AUC of 5, Alimta 500 mg/M2 and Avastin 15 mg/KG every 3  weeks, status post 10 cycles.  Starting from cycle #7 she is on maintenance treatment with Alimta and Avastin every 3 weeks. She also continues her current treatment with Tagrisso 80 mg p.o. daily. The patient continues to tolerate her treatment well with no concerning adverse effects. She had MRI of the brain performed recently that showed no evidence for disease progression in the brain. I recommended for her to proceed with cycle #11 today as planned. I will see the patient back for follow-up visit in 3 weeks for evaluation  before starting cycle #12. For the hoarseness of her voice, I would consider referring the patient to ENT if she has no improvement by the next visit. She was advised to call immediately if she has any concerning symptoms in the interval. The patient voices understanding of current disease status and treatment options and is in agreement with the current care plan. All questions were answered. The patient knows to call the clinic with any problems, questions or concerns. We can certainly see the patient much sooner if necessary.  Disclaimer: This note was dictated with voice recognition software. Similar sounding words can inadvertently be transcribed and may not be corrected upon review.

## 2019-09-14 NOTE — Patient Instructions (Signed)
[facility] Discharge Instructions for Patients Receiving Chemotherapy  Today you received the following chemotherapy agents: avastin, alimta   To help prevent nausea and vomiting after your treatment, we encourage you to take your nausea medication as directed.    If you develop nausea and vomiting that is not controlled by your nausea medication, call the clinic.   BELOW ARE SYMPTOMS THAT SHOULD BE REPORTED IMMEDIATELY:  *FEVER GREATER THAN 100.5 F  *CHILLS WITH OR WITHOUT FEVER  NAUSEA AND VOMITING THAT IS NOT CONTROLLED WITH YOUR NAUSEA MEDICATION  *UNUSUAL SHORTNESS OF BREATH  *UNUSUAL BRUISING OR BLEEDING  TENDERNESS IN MOUTH AND THROAT WITH OR WITHOUT PRESENCE OF ULCERS  *URINARY PROBLEMS  *BOWEL PROBLEMS  UNUSUAL RASH Items with * indicate a potential emergency and should be followed up as soon as possible.  Feel free to call the clinic should you have any questions or concerns. The clinic phone number is [phone].  Please show the [facility] at check-in to the Emergency Department and triage nurse.

## 2019-09-15 ENCOUNTER — Other Ambulatory Visit: Payer: 59

## 2019-09-15 ENCOUNTER — Ambulatory Visit: Payer: 59 | Admitting: Internal Medicine

## 2019-09-15 ENCOUNTER — Ambulatory Visit: Payer: 59

## 2019-09-22 ENCOUNTER — Other Ambulatory Visit: Payer: Self-pay | Admitting: Internal Medicine

## 2019-09-23 MED FILL — fentaNYL 25 MCG/HR PT72: 25 | 15 days supply | Qty: 5 | Fill #0

## 2019-09-23 NOTE — Telephone Encounter (Signed)
See refill

## 2019-10-04 ENCOUNTER — Inpatient Hospital Stay: Payer: Medicare Other

## 2019-10-04 ENCOUNTER — Telehealth: Payer: Self-pay | Admitting: Internal Medicine

## 2019-10-04 ENCOUNTER — Inpatient Hospital Stay: Payer: Medicare Other | Attending: Oncology | Admitting: Internal Medicine

## 2019-10-04 ENCOUNTER — Encounter: Payer: Self-pay | Admitting: Internal Medicine

## 2019-10-04 ENCOUNTER — Other Ambulatory Visit: Payer: Self-pay

## 2019-10-04 VITALS — BP 132/77 | HR 95 | Temp 97.7°F | Resp 18 | Ht 66.0 in | Wt 188.2 lb

## 2019-10-04 DIAGNOSIS — C7931 Secondary malignant neoplasm of brain: Secondary | ICD-10-CM

## 2019-10-04 DIAGNOSIS — R918 Other nonspecific abnormal finding of lung field: Secondary | ICD-10-CM | POA: Insufficient documentation

## 2019-10-04 DIAGNOSIS — C3492 Malignant neoplasm of unspecified part of left bronchus or lung: Secondary | ICD-10-CM | POA: Diagnosis not present

## 2019-10-04 DIAGNOSIS — Z5112 Encounter for antineoplastic immunotherapy: Secondary | ICD-10-CM | POA: Diagnosis not present

## 2019-10-04 DIAGNOSIS — Z5111 Encounter for antineoplastic chemotherapy: Secondary | ICD-10-CM | POA: Insufficient documentation

## 2019-10-04 DIAGNOSIS — K5090 Crohn's disease, unspecified, without complications: Secondary | ICD-10-CM | POA: Diagnosis not present

## 2019-10-04 DIAGNOSIS — C3490 Malignant neoplasm of unspecified part of unspecified bronchus or lung: Secondary | ICD-10-CM | POA: Diagnosis not present

## 2019-10-04 DIAGNOSIS — R490 Dysphonia: Secondary | ICD-10-CM | POA: Insufficient documentation

## 2019-10-04 DIAGNOSIS — I10 Essential (primary) hypertension: Secondary | ICD-10-CM

## 2019-10-04 DIAGNOSIS — Z95828 Presence of other vascular implants and grafts: Secondary | ICD-10-CM

## 2019-10-04 DIAGNOSIS — R21 Rash and other nonspecific skin eruption: Secondary | ICD-10-CM | POA: Diagnosis not present

## 2019-10-04 LAB — CBC WITH DIFFERENTIAL (CANCER CENTER ONLY)
Abs Immature Granulocytes: 0.03 10*3/uL (ref 0.00–0.07)
Basophils Absolute: 0 10*3/uL (ref 0.0–0.1)
Basophils Relative: 1 %
Eosinophils Absolute: 0.2 10*3/uL (ref 0.0–0.5)
Eosinophils Relative: 2 %
HCT: 35.2 % — ABNORMAL LOW (ref 36.0–46.0)
Hemoglobin: 10.8 g/dL — ABNORMAL LOW (ref 12.0–15.0)
Immature Granulocytes: 0 %
Lymphocytes Relative: 22 %
Lymphs Abs: 1.7 10*3/uL (ref 0.7–4.0)
MCH: 31.3 pg (ref 26.0–34.0)
MCHC: 30.7 g/dL (ref 30.0–36.0)
MCV: 102 fL — ABNORMAL HIGH (ref 80.0–100.0)
Monocytes Absolute: 1.4 10*3/uL — ABNORMAL HIGH (ref 0.1–1.0)
Monocytes Relative: 18 %
Neutro Abs: 4.3 10*3/uL (ref 1.7–7.7)
Neutrophils Relative %: 57 %
Platelet Count: 328 10*3/uL (ref 150–400)
RBC: 3.45 MIL/uL — ABNORMAL LOW (ref 3.87–5.11)
RDW: 15.2 % (ref 11.5–15.5)
WBC Count: 7.6 10*3/uL (ref 4.0–10.5)
nRBC: 0 % (ref 0.0–0.2)

## 2019-10-04 LAB — CMP (CANCER CENTER ONLY)
ALT: 10 U/L (ref 0–44)
AST: 21 U/L (ref 15–41)
Albumin: 3.5 g/dL (ref 3.5–5.0)
Alkaline Phosphatase: 77 U/L (ref 38–126)
Anion gap: 8 (ref 5–15)
BUN: 13 mg/dL (ref 6–20)
CO2: 29 mmol/L (ref 22–32)
Calcium: 9.9 mg/dL (ref 8.9–10.3)
Chloride: 103 mmol/L (ref 98–111)
Creatinine: 1.29 mg/dL — ABNORMAL HIGH (ref 0.44–1.00)
GFR, Est AFR Am: 55 mL/min — ABNORMAL LOW (ref >60)
GFR, Estimated: 47 mL/min — ABNORMAL LOW (ref >60)
Glucose, Bld: 90 mg/dL (ref 70–99)
Potassium: 3.8 mmol/L (ref 3.5–5.1)
Sodium: 140 mmol/L (ref 135–145)
Total Bilirubin: 0.3 mg/dL (ref 0.3–1.2)
Total Protein: 7.6 g/dL (ref 6.5–8.1)

## 2019-10-04 LAB — TOTAL PROTEIN, URINE DIPSTICK: Protein, ur: NEGATIVE mg/dL

## 2019-10-04 MED ORDER — DEXAMETHASONE 4 MG PO TABS: ORAL_TABLET | ORAL | 2 refills | Status: DC

## 2019-10-04 MED ORDER — SODIUM CHLORIDE 0.9% FLUSH
10.0000 mL | INTRAVENOUS | Status: DC | PRN
  Administered 2019-10-04: 10 mL
  Filled 2019-10-04: qty 10

## 2019-10-04 MED FILL — DEXAMETHASONE 4 MG TABLET: 4 | 21 days supply | Qty: 6 | Fill #1

## 2019-10-04 NOTE — Progress Notes (Signed)
Patient wanted to stay accessed for 9/8 treatment.

## 2019-10-04 NOTE — Progress Notes (Signed)
[place] [phone]   [phone]) [phone]  OFFICE PROGRESS NOTE  Copland, Gay Filler, MD [address] [place] [address]  DIAGNOSIS: DIAGNOSIS: stage IV (T3, N2, M1 a) non-small cell lung cancer, poorly differentiated adenocarcinoma with extensive miliary distribution in the lungs bilaterally diagnosed in September 2018. 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 NEGATIVE for the Exon 20 T790M mutation. Repeat molecular studies by guardant 360 at time of progression showed ATM [id number] [facility] B[facility] mutation, V600E  PRIOR THERAPY:  1) Gilotrif 40 mg daily started 11/08/2016.Status post 13 months of treatment. 2) Tagrisso 80 mg p.o. daily.  First dose started January 01, 2018.  Status post 18 months of treatment.   CURRENT THERAPY: 1) systemic chemotherapy with carboplatin for AUC 5, Alimta 500 mg/M2 and Avastin 15 mg/KG every 3 weeks.  First dose February 10, 2018.  Status post 11 cycles.  Starting from cycle #7 she will be on maintenance treatment with Alimta and Avastin in addition to her treatment with Tagrisso.  INTERVAL HISTORY: Nancy EvesBest 54y.o. female returns to the clinic today for follow-up visit.  The patient is feeling fine today with no concerning complaints.  Her pain is much better after starting the fentanyl patch but she has 1 episode of feeling sleepy recently when she was driving.  She is now trying to avoid driving while on the fentanyl patch.  She denied having any current chest pain, shortness of breath, cough or hemoptysis.  She denied having any fever or chills.  She has no nausea, vomiting, diarrhea or constipation.  She has no headache or visual changes.  She continues to tolerate her treatment with maintenance Alimta and Avastin as well as Tagrisso fairly well.  The patient is here today for evaluation before  starting cycle #12 of her treatment tomorrow.  MEDICAL HISTORY: Past Medical History:  Diagnosis Date  . Adenocarcinoma of left lung, stage 4 ([facility] 10/28/2016  . Anemia   . Arthritis   . Constipation   . Crohn's disease ([facility]    in remission 35 years +  . Dyspnea    gets "winded"  . GERD (gastroesophageal reflux disease)   . Goals of care, counseling/discussion 10/28/2016  . History of hiatal hernia    noted on CT 12/20  . Hypertension   . Pneumonia    walking pneumonia in the late 80's  . Recurrent pleural effusion on left     ALLERGIES:  is allergic to percocet [oxycodone-acetaminophen], lisinopril, and losartan.  MEDICATIONS:  Current Outpatient Medications  Medication Sig Dispense Refill  . albuterol (VENTOLIN HFA) 108 (90 Base) MCG/ACT inhaler Inhale 2 puffs into the lungs every 6 (six) hours as needed for wheezing or shortness of breath. 8 g 1  . clindamycin (CLINDAGEL) 1 % gel Apply 1 application topically 2 (two) times daily. 30 g 1  . dexamethasone (DECADRON) 4 MG tablet 1 tablet p.o. twice daily the day before, day of and day after chemotherapy every 3 weeks (Patient taking differently: Take 4 mg by mouth See admin instructions. 4 mg  twice daily the day before, day of and day after chemotherapy every 3 weeks) 20 tablet 2  . fentaNYL (DURAGESIC) 25 MCG/HR PLACE 1 PATCH ONTO THE SKIN EVERY 3 DAYS 5 patch 0  . fluticasone (FLONASE) 50 MCG/ACT nasal spray Place 2 sprays into both nostrils daily  as needed for allergies. 16 g 3  . folic acid (FOLVITE) 1 MG tablet TAKE 1 TABLET BY MOUTH ONCE A DAY 30 tablet 4  . furosemide (LASIX) 40 MG tablet Take 1 tablet (40 mg total) by mouth daily. 30 tablet 3  . guaiFENesin (MUCINEX) 600 MG 12 hr tablet Take 600 mg by mouth 2 (two) times daily as needed for to loosen phlegm.     Marland Kitchen HYDROcodone-homatropine (HYCODAN) 5-1.5 MG/5ML syrup Take 5 mLs by mouth every 6 (six) hours as needed for cough. 240 mL 0  . lidocaine-prilocaine (EMLA) cream  APPLY TO THE AFFECTED AREA(S) AS NEEDED 30 g 0  . linaclotide (LINZESS) 145 MCG CAPS capsule Take 1 capsule (145 mcg total) by mouth daily before breakfast. 30 capsule 6  . loperamide (IMODIUM) 1 MG/5ML solution Take 10 mLs (2 mg total) by mouth as needed for diarrhea or loose stools. (Patient taking differently: Take 2 mg by mouth daily as needed for diarrhea or loose stools. ) 120 mL 0  . loratadine (CLARITIN) 10 MG tablet Take 10 mg by mouth daily.    . magnesium oxide (MAG-OX) 400 (241.3 Mg) MG tablet Take 1 tablet (400 mg total) by mouth daily.    . methocarbamol (ROBAXIN) 500 MG tablet TAKE 1 TABLET (500 MG TOTAL) BY MOUTH EVERY 8 (EIGHT) HOURS AS NEEDED FOR MUSCLE SPASMS. 30 tablet 5  . nystatin cream (MYCOSTATIN) APPLY 1 APPLICATION TOPICALLY 2 (TWO) TIMES DAILY. (Patient taking differently: Apply 1 application topically daily as needed (skin irritaion). ) 30 g 0  . ondansetron (ZOFRAN) 8 MG tablet TAKE 1 TABLET BY MOUTH EVERY 8 HOURS AS NEEDED FOR NAUSEA OR VOMITING (Patient taking differently: Take 8 mg by mouth every 8 (eight) hours as needed for nausea or vomiting. ) 30 tablet 2  . oxyCODONE (OXY IR/ROXICODONE) 5 MG immediate release tablet TAKE 1 TABLET BY MOUTH EVERY 6 HOURS AS NEEDED FOR PAIN 30 tablet 0  . pantoprazole (PROTONIX) 40 MG tablet Take 1 tablet (40 mg total) by mouth daily. 30 tablet 5  . potassium chloride SA (KLOR-CON) 20 MEQ tablet Take 1 tablet (20 mEq total) by mouth 2 (two) times daily. 60 tablet 5  . prochlorperazine (COMPAZINE) 10 MG tablet Take 1 tablet (10 mg total) by mouth every 6 (six) hours as needed for nausea or vomiting. 30 tablet 2  . senna-docusate (SENOKOT-S) 8.6-50 MG tablet Take 1 tablet by mouth at bedtime.    . traMADol (ULTRAM) 50 MG tablet Take 1 tablet (50 mg total) by mouth every 6 (six) hours as needed. 40 tablet 0   No current facility-administered medications for this visit.   Facility-Administered Medications Ordered in Other Visits    Medication Dose Route Frequency Provider Last Rate Last Admin  . sodium chloride flush (NS) 0.9 % injection 10 mL  10 mL Intracatheter PRN Curt Bears, MD   10 mL at 10/04/19 9628    SURGICAL HISTORY:  Past Surgical History:  Procedure Laterality Date  . ABDOMINAL HYSTERECTOMY     partial hysterectomy  . BREAST EXCISIONAL BIOPSY Left 20+ yrs ago   benign  . CHEST TUBE INSERTION Left 10/26/2018   Procedure: INSERTION PLEURAL DRAINAGE CATHETER;  Surgeon: Ivin Poot, MD;  Location: [facility];  Service: Thoracic;  Laterality: Left;  . COLONOSCOPY    . IR IMAGING GUIDED PORT INSERTION  07/04/2019  . PERICARDIAL WINDOW N/A 01/10/2017   Procedure: PERICARDIAL WINDOW- SUB XYPHOID, RIGHT CHEST TUBE;  Surgeon: Prescott Gum,  Collier Salina, MD;  Location: [facility];  Service: Thoracic;  Laterality: N/A;  . PORT-A-CATH REMOVAL Left 05/10/2019   Procedure: REMOVAL PORT-A-CATH;  Surgeon: Ivin Poot, MD;  Location: [facility];  Service: Thoracic;  Laterality: Left;  . PORTACATH PLACEMENT Left 02/21/2019   Procedure: INSERTION PORT-A-CATH;  Surgeon: Ivin Poot, MD;  Location: [facility];  Service: Thoracic;  Laterality: Left;  . REMOVAL OF PLEURAL DRAINAGE CATHETER Left 02/21/2019   Procedure: REMOVAL OF PLEURAL DRAINAGE CATHETER;  Surgeon: Ivin Poot, MD;  Location: [facility];  Service: Thoracic;  Laterality: Left;  Marland Kitchen VIDEO BRONCHOSCOPY Bilateral 10/21/2016   Procedure: VIDEO BRONCHOSCOPY WITH FLUORO;  Surgeon: Tanda Rockers, MD;  Location: [facility];  Service: Cardiopulmonary;  Laterality: Bilateral;    REVIEW OF SYSTEMS:  A comprehensive review of systems was negative except for: Constitutional: positive for fatigue   PHYSICAL EXAMINATION: General appearance: alert, cooperative, fatigued and no distress Head: Normocephalic, without obvious abnormality, atraumatic Neck: no adenopathy, no JVD, supple, symmetrical, trachea midline and thyroid not enlarged, symmetric, no tenderness/mass/nodules Lymph  nodes: Cervical, supraclavicular, and axillary nodes normal. Resp: clear to auscultation bilaterally Back: symmetric, no curvature. ROM normal. No CVA tenderness. Cardio: regular rate and rhythm, S1, S2 normal, no murmur, click, rub or gallop GI: soft, non-tender; bowel sounds normal; no masses,  no organomegaly Extremities: extremities normal, atraumatic, no cyanosis or edema  ECOG PERFORMANCE STATUS: 1 - Symptomatic but completely ambulatory  Blood pressure 132/77, pulse 95, temperature 97.7 F (36.5 C), temperature source Tympanic, resp. rate 18, height _0  (1.676 m), weight 188 lb 3.2 oz (85.4 kg), last menstrual period 03/15/2010, SpO2 96 %.  LABORATORY DATA: Lab Results  Component Value Date   WBC 7.6 10/04/2019   HGB 10.8 (L) 10/04/2019   HCT 35.2 (L) 10/04/2019   MCV 102.0 (H) 10/04/2019   PLT 328 10/04/2019      Chemistry      Component Value Date/Time   NA 139 09/14/2019 0832   NA 135 (L) 01/08/2017 1055   K 4.0 09/14/2019 0832   K 3.2 (L) 01/08/2017 1055   CL 104 09/14/2019 0832   CO2 24 09/14/2019 0832   CO2 25 01/08/2017 1055   BUN 16 09/14/2019 0832   BUN 14.6 01/08/2017 1055   CREATININE 1.24 (H) 09/14/2019 0832   CREATININE 1.3 (H) 01/08/2017 1055      Component Value Date/Time   CALCIUM 10.3 09/14/2019 0832   CALCIUM 9.5 01/08/2017 1055   ALKPHOS 79 09/14/2019 0832   ALKPHOS 82 01/08/2017 1055   AST 19 09/14/2019 0832   AST 31 01/08/2017 1055   ALT 7 09/14/2019 0832   ALT 34 01/08/2017 1055   BILITOT 0.3 09/14/2019 0832   BILITOT 1.74 (H) 01/08/2017 1055       RADIOGRAPHIC STUDIES: No results found.  ASSESSMENT AND PLAN: This is a very pleasant 54 years old African-American female with metastatic non-small cell lung cancer, adenocarcinoma and positive for EGFR mutation with deletion in exon 19.  The patient is currently on treatment with GILOTRIF 40 mg p.o. daily status post 13 months.  The patient is tolerating this treatment well except for  few episodes of diarrhea and mild skin rash. She is complaining of dizzy spells and vertigo recently. Repeat CT scan of the chest, abdomen and pelvis performed recently showed some evidence for disease progression with increase in size and number of pulmonary nodules. Her treatment was switched to Tagrisso 80 mg p.o. daily started January 01, 2018.  She is status post 12 months of treatment She had repeat MRI of the brain as well as CT scan of the chest, abdomen pelvis performed recently. Her MRI of the brain showed no concerning findings for disease progression in the brain but CT scan of the chest showed some evidence for progression with lymphangitic spread of the disease. She was referred to Dr. Durenda Hurt at [facility] and repeat molecular studies showed the emergence of BRAF mutation V600E. I recommended for the patient to consider systemic chemotherapy and she is here today for evaluation and discussion of this option.  I had a lengthy discussion with the patient today regarding her condition.  She understands that a combination of BRAF inhibitor and Tagrisso has not been well studied at this point. She is currently undergoing systemic chemotherapy with carboplatin for AUC of 5, Alimta 500 mg/M2 and Avastin 15 mg/KG every 3 weeks, status post 11 cycles.  Starting from cycle #7 she is on maintenance treatment with Alimta and Avastin every 3 weeks.  The patient continues to tolerate this treatment fairly well. I recommended for her to proceed with cycle #12 tomorrow as planned. She also continues her current treatment with Tagrisso 80 mg p.o. daily. For the hoarseness of her voice, we will continue to monitor for now and consider the patient for referral to ENT if she has worsening of her symptoms. She will come back for follow-up visit in 3 weeks for evaluation before the next cycle of her treatment. She was advised to call immediately if she has any concerning symptoms in the interval. The  patient voices understanding of current disease status and treatment options and is in agreement with the current care plan. All questions were answered. The patient knows to call the clinic with any problems, questions or concerns. We can certainly see the patient much sooner if necessary.  Disclaimer: This note was dictated with voice recognition software. Similar sounding words can inadvertently be transcribed and may not be corrected upon review.

## 2019-10-04 NOTE — Telephone Encounter (Signed)
Scheduled per los. Gave avs and calendar  

## 2019-10-04 NOTE — Patient Instructions (Signed)

## 2019-10-05 ENCOUNTER — Inpatient Hospital Stay: Payer: Medicare Other

## 2019-10-05 ENCOUNTER — Other Ambulatory Visit: Payer: Self-pay

## 2019-10-05 VITALS — BP 133/77 | HR 84 | Temp 98.1°F | Resp 18

## 2019-10-05 DIAGNOSIS — R21 Rash and other nonspecific skin eruption: Secondary | ICD-10-CM | POA: Diagnosis not present

## 2019-10-05 DIAGNOSIS — K5090 Crohn's disease, unspecified, without complications: Secondary | ICD-10-CM | POA: Diagnosis not present

## 2019-10-05 DIAGNOSIS — Z5111 Encounter for antineoplastic chemotherapy: Secondary | ICD-10-CM | POA: Diagnosis not present

## 2019-10-05 DIAGNOSIS — Z5112 Encounter for antineoplastic immunotherapy: Secondary | ICD-10-CM | POA: Diagnosis not present

## 2019-10-05 DIAGNOSIS — R490 Dysphonia: Secondary | ICD-10-CM | POA: Diagnosis not present

## 2019-10-05 DIAGNOSIS — C3490 Malignant neoplasm of unspecified part of unspecified bronchus or lung: Secondary | ICD-10-CM

## 2019-10-05 DIAGNOSIS — R918 Other nonspecific abnormal finding of lung field: Secondary | ICD-10-CM | POA: Diagnosis not present

## 2019-10-05 DIAGNOSIS — C3492 Malignant neoplasm of unspecified part of left bronchus or lung: Secondary | ICD-10-CM | POA: Diagnosis not present

## 2019-10-05 MED ORDER — HEPARIN SOD (PORK) LOCK FLUSH 100 UNIT/ML IV SOLN
500.0000 [IU] | Freq: Once | INTRAVENOUS | Status: AC | PRN
  Administered 2019-10-05: 500 [IU]
  Filled 2019-10-05: qty 5

## 2019-10-05 MED ORDER — PROCHLORPERAZINE MALEATE 10 MG PO TABS
10.0000 mg | ORAL_TABLET | Freq: Once | ORAL | Status: AC
  Administered 2019-10-05: 10 mg via ORAL

## 2019-10-05 MED ORDER — SODIUM CHLORIDE 0.9 % IV SOLN
400.0000 mg/m2 | Freq: Once | INTRAVENOUS | Status: AC
  Administered 2019-10-05: 800 mg via INTRAVENOUS
  Filled 2019-10-05: qty 20

## 2019-10-05 MED ORDER — CYANOCOBALAMIN 1000 MCG/ML IJ SOLN
1000.0000 ug | Freq: Once | INTRAMUSCULAR | Status: AC
  Administered 2019-10-05: 1000 ug via INTRAMUSCULAR

## 2019-10-05 MED ORDER — SODIUM CHLORIDE 0.9 % IV SOLN
15.0000 mg/kg | Freq: Once | INTRAVENOUS | Status: AC
  Administered 2019-10-05: 1300 mg via INTRAVENOUS
  Filled 2019-10-05: qty 48

## 2019-10-05 MED ORDER — PROCHLORPERAZINE MALEATE 10 MG PO TABS
ORAL_TABLET | ORAL | Status: AC
  Filled 2019-10-05: qty 1

## 2019-10-05 MED ORDER — SODIUM CHLORIDE 0.9% FLUSH
10.0000 mL | INTRAVENOUS | Status: DC | PRN
  Administered 2019-10-05: 10 mL
  Filled 2019-10-05: qty 10

## 2019-10-05 MED ORDER — SODIUM CHLORIDE 0.9 % IV SOLN
Freq: Once | INTRAVENOUS | Status: AC
  Filled 2019-10-05: qty 250

## 2019-10-05 MED ORDER — CYANOCOBALAMIN 1000 MCG/ML IJ SOLN
INTRAMUSCULAR | Status: AC
  Filled 2019-10-05: qty 1

## 2019-10-05 NOTE — Patient Instructions (Addendum)
[facility] Discharge Instructions for Patients Receiving Chemotherapy  Today you received the following chemotherapy agents: avastin, alimta   To help prevent nausea and vomiting after your treatment, we encourage you to take your nausea medication as directed.    If you develop nausea and vomiting that is not controlled by your nausea medication, call the clinic.   BELOW ARE SYMPTOMS THAT SHOULD BE REPORTED IMMEDIATELY:  *FEVER GREATER THAN 100.5 F  *CHILLS WITH OR WITHOUT FEVER  NAUSEA AND VOMITING THAT IS NOT CONTROLLED WITH YOUR NAUSEA MEDICATION  *UNUSUAL SHORTNESS OF BREATH  *UNUSUAL BRUISING OR BLEEDING  TENDERNESS IN MOUTH AND THROAT WITH OR WITHOUT PRESENCE OF ULCERS  *URINARY PROBLEMS  *BOWEL PROBLEMS  UNUSUAL RASH Items with * indicate a potential emergency and should be followed up as soon as possible.  Feel free to call the clinic should you have any questions or concerns. The clinic phone number is [phone].  Please show the [facility] at check-in to the Emergency Department and triage nurse.  Cyanocobalamin, Vitamin B12 injection What is this medicine? CYANOCOBALAMIN (sye an oh koe BAL a min) is a man made form of vitamin B12. Vitamin B12 is used in the growth of healthy blood cells, nerve cells, and proteins in the body. It also helps with the metabolism of fats and carbohydrates. This medicine is used to treat people who can not absorb vitamin B12. This medicine may be used for other purposes; ask your health care provider or pharmacist if you have questions. COMMON BRAND NAME(S): B-12 Compliance Kit, B-12 Injection Kit, Cyomin, LA-12, Nutri-Twelve, Physicians EZ Use B-12, Primabalt What should I tell my health care provider before I take this medicine? They need to know if you have any of these conditions:  kidney disease  Leber's disease  megaloblastic anemia  an unusual or allergic reaction to cyanocobalamin, cobalt, other  medicines, foods, dyes, or preservatives  pregnant or trying to get pregnant  breast-feeding How should I use this medicine? This medicine is injected into a muscle or deeply under the skin. It is usually given by a health care professional in a clinic or doctor's office. However, your doctor may teach you how to inject yourself. Follow all instructions. Talk to your pediatrician regarding the use of this medicine in children. Special care may be needed. Overdosage: If you think you have taken too much of this medicine contact a poison control center or emergency room at once. NOTE: This medicine is only for you. Do not share this medicine with others. What if I miss a dose? If you are given your dose at a clinic or doctor's office, call to reschedule your appointment. If you give your own injections and you miss a dose, take it as soon as you can. If it is almost time for your next dose, take only that dose. Do not take double or extra doses. What may interact with this medicine?  colchicine  heavy alcohol intake This list may not describe all possible interactions. Give your health care provider a list of all the medicines, herbs, non-prescription drugs, or dietary supplements you use. Also tell them if you smoke, drink alcohol, or use illegal drugs. Some items may interact with your medicine. What should I watch for while using this medicine? Visit your doctor or health care professional regularly. You may need blood work done while you are taking this medicine. You may need to follow a special diet. Talk to your doctor. Limit your  alcohol intake and avoid smoking to get the Angerer benefit. What side effects may I notice from receiving this medicine? Side effects that you should report to your doctor or health care professional as soon as possible:  allergic reactions like skin rash, itching or hives, swelling of the face, lips, or tongue  blue tint to skin  chest tightness,  pain  difficulty breathing, wheezing  dizziness  red, swollen painful area on the leg Side effects that usually do not require medical attention (report to your doctor or health care professional if they continue or are bothersome):  diarrhea  headache This list may not describe all possible side effects. Call your doctor for medical advice about side effects. You may report side effects to FDA at 1-800-FDA-1088. Where should I keep my medicine? Keep out of the reach of children. Store at room temperature between 15 and 30 degrees C (59 and 85 degrees F). Protect from light. Throw away any unused medicine after the expiration date. NOTE: This sheet is a summary. It may not cover all possible information. If you have questions about this medicine, talk to your doctor, pharmacist, or health care provider.  2020 Elsevier/Gold Standard (2007-04-26 22:10:20)

## 2019-10-11 ENCOUNTER — Other Ambulatory Visit: Payer: Self-pay | Admitting: Physician Assistant

## 2019-10-11 ENCOUNTER — Telehealth: Payer: Self-pay | Admitting: Medical Oncology

## 2019-10-11 DIAGNOSIS — C3492 Malignant neoplasm of unspecified part of left bronchus or lung: Secondary | ICD-10-CM

## 2019-10-11 DIAGNOSIS — R52 Pain, unspecified: Secondary | ICD-10-CM

## 2019-10-11 MED FILL — POTASSIUM CHLORIDE CRYS ER: 20 | 30 days supply | Qty: 60 | Fill #1

## 2019-10-11 MED FILL — FUROSEMIDE 40 MG TAB: 40 | 30 days supply | Qty: 30 | Fill #2

## 2019-10-11 MED FILL — FLUTICASONE PROP 50 MCG SPR: 50 | 30 days supply | Qty: 16 | Fill #3

## 2019-10-11 MED FILL — PANTOPRAZOLE SOD DR 40 MG T: 40 | 30 days supply | Qty: 30 | Fill #1

## 2019-10-11 MED FILL — oxyCODONE HCL 5 MG TABS: 5 | 8 days supply | Qty: 30 | Fill #0

## 2019-10-11 MED FILL — FOLIC ACID 1 MG TABS: 1 | 30 days supply | Qty: 30 | Fill #3

## 2019-10-11 MED FILL — LINZESS 145 MCG CAPSULE: 145 | 30 days supply | Qty: 30 | Fill #4

## 2019-10-11 NOTE — Telephone Encounter (Addendum)
Oxycodone dose- "Please ask Dr Julien Nordmann to consider prescribing the 10 mg dose instead of taking an extra 5 mg prn". She has 3 tablets left of oxycodone 5 mg.    She uses duragesic continuously for 1 week after chemo and stays at home all week.   The week after chemo she  uses the oxycodone ,instead of Duragesic,  so she get keep her pain controlled to  go to store and for example ,  this Saturday she is planning to  walk in lung cancer walk    .

## 2019-10-11 NOTE — Telephone Encounter (Addendum)
Pain Management-Oxycodone dose- She said the oxycodone 5 mg does not relieve her pain like the 10 mg did ( another provider prescribed this several times in the past. )  She searched her records and had been on oxycodone 10 ( other providers) and wondered why her pain was not relieved with the 5 mg.  Please advise -can she take 1-2 every 6 hours prn pain?

## 2019-10-12 ENCOUNTER — Other Ambulatory Visit: Payer: Self-pay | Admitting: Internal Medicine

## 2019-10-12 DIAGNOSIS — C3492 Malignant neoplasm of unspecified part of left bronchus or lung: Secondary | ICD-10-CM

## 2019-10-12 NOTE — Telephone Encounter (Signed)
We will keep the 5 mg tablet for now but she can use an extra 1 if needed.  Thank you.

## 2019-10-12 NOTE — Telephone Encounter (Signed)
Pt.notified

## 2019-10-13 MED FILL — TAGRISSO 80 MG TABLET: 80 | 30 days supply | Qty: 30 | Fill #0

## 2019-10-18 NOTE — Progress Notes (Signed)
[place] at [facility] [address], [place], [place] [address] [phone]  Date:  10/19/2019   Name:  Nancy Moore   DOB:  07-27-1965   MRN:  [id number]  PCP:  Darreld Mclean, MD    Chief Complaint: No chief complaint on file.   History of Present Illness:  Nancy Moore is a 54 y.o. very pleasant female patient who presents with the following:  Virtual visit today for concern of illness Past medical history most significant for metastatic non-small cell lung cancer-seen most recently by oncology on September 7 She also has mild chronic renal insufficiency, most recent creatinine 1.29, mild anemia  Patient location is home, provider location is office.  Patient identity confirmed with 2 factors, she gives consent for virtual visit today The patient and myself are on the call today  Pt notes her voice has been hoarse since June She is using mucinex OTC She has noted persistent chest congestion, she is unable to cough up any material She feels generally at her baseline otherwise   She had her most recent CT scan in July: IMPRESSION: 1. No substantial interval change in the diffuse miliary nodularity of both lungs. The dominant confluent nodules in each lung are similar in the interval. No new or progressive pulmonary nodularity. 2. Similar appearance of mild mediastinal lymphadenopathy. 3. Similar small left pleural effusion. 4. No evidence for metastatic disease in the abdomen. 5. Aortic Atherosclerosis (ICD10-I70.0).  She is using fentanyl patches and also oral oxycodone as needed She has not noted any fever or other sx of acute illness right now   She had her original 2 covid shots -we talked today about her getting a booster, and also flu shot.  She plans to do both of these later     Patient Active Problem List   Diagnosis Date Noted  . Visit for wound check 06/01/2019  . Infection due to Port-A-Cath  05/06/2019  . Port-A-Cath in place 03/24/2019  . Postoperative visit 03/16/2019  . Encounter for postoperative wound check 10/28/2018  . Pleural effusion 10/13/2018  . Brain metastases ([place]) 05/13/2018  . Primary malignant neoplasm of lung with metastasis to brain ([place]) 02/05/2018  . Cardiac/pericardial tamponade   . Chest tube in place   . Pain   . Acute blood loss anemia   . Palliative care encounter   . AKI (acute kidney injury) ([place]) 01/10/2017  . Hypotension 01/09/2017  . Essential hypertension 01/09/2017  . Hypokalemia 01/09/2017  . Dyspnea 01/09/2017  . Leukocytosis 01/09/2017  . Pleural effusion on right 01/08/2017  . Encounter for antineoplastic chemotherapy 11/20/2016  . Adenocarcinoma of left lung, stage 4 ([place]) 10/28/2016  . Goals of care, counseling/discussion 10/28/2016  . Upper airway cough syndrome 10/16/2016  . Multiple pulmonary nodules 10/15/2016    Past Medical History:  Diagnosis Date  . Adenocarcinoma of left lung, stage 4 ([place]) 10/28/2016  . Anemia   . Arthritis   . Constipation   . Crohn's disease ([place])    in remission 35 years +  . Dyspnea    gets "winded"  . GERD (gastroesophageal reflux disease)   . Goals of care, counseling/discussion 10/28/2016  . History of hiatal hernia    noted on CT 12/20  . Hypertension   . Pneumonia    walking pneumonia in the late 80's  . Recurrent pleural effusion on left     Past Surgical History:  Procedure Laterality  Date  . ABDOMINAL HYSTERECTOMY     partial hysterectomy  . BREAST EXCISIONAL BIOPSY Left 20+ yrs ago   benign  . CHEST TUBE INSERTION Left 10/26/2018   Procedure: INSERTION PLEURAL DRAINAGE CATHETER;  Surgeon: Ivin Poot, MD;  Location: [place];  Service: Thoracic;  Laterality: Left;  . COLONOSCOPY    . IR IMAGING GUIDED PORT INSERTION  07/04/2019  . PERICARDIAL WINDOW N/A 01/10/2017   Procedure: PERICARDIAL WINDOW- SUB XYPHOID, RIGHT CHEST TUBE;  Surgeon: Ivin Poot, MD;  Location: [place];  Service: Thoracic;  Laterality: N/A;  . PORT-A-CATH REMOVAL Left 05/10/2019   Procedure: REMOVAL PORT-A-CATH;  Surgeon: Ivin Poot, MD;  Location: [place];  Service: Thoracic;  Laterality: Left;  . PORTACATH PLACEMENT Left 02/21/2019   Procedure: INSERTION PORT-A-CATH;  Surgeon: Ivin Poot, MD;  Location: [place];  Service: Thoracic;  Laterality: Left;  . REMOVAL OF PLEURAL DRAINAGE CATHETER Left 02/21/2019   Procedure: REMOVAL OF PLEURAL DRAINAGE CATHETER;  Surgeon: Ivin Poot, MD;  Location: [place];  Service: Thoracic;  Laterality: Left;  Marland Kitchen VIDEO BRONCHOSCOPY Bilateral 10/21/2016   Procedure: VIDEO BRONCHOSCOPY WITH FLUORO;  Surgeon: Tanda Rockers, MD;  Location: [facility];  Service: Cardiopulmonary;  Laterality: Bilateral;    Social History   Tobacco Use  . Smoking status: Never Smoker  . Smokeless tobacco: Never Used  Vaping Use  . Vaping Use: Never used  Substance Use Topics  . Alcohol use: Not Currently    Comment: socially  . Drug use: No    Family History  Problem Relation Age of Onset  . Asthma Mother   . Stroke Mother   . Hypertension Mother   . Heart attack Father   . Hypertension Father   . Hyperlipidemia Father   . Dementia Father   . Emphysema Maternal Grandmother   . Hypertension Maternal Grandmother   . Colon cancer Paternal Grandmother   . Brain cancer Maternal Uncle     Allergies  Allergen Reactions  . Percocet [Oxycodone-Acetaminophen] Other (See Comments)    "makes me dizzy" / takes TID at home  . Lisinopril Cough  . Losartan Cough    Medication list has been reviewed and updated.  Current Outpatient Medications on File Prior to Visit  Medication Sig Dispense Refill  . albuterol (VENTOLIN HFA) 108 (90 Base) MCG/ACT inhaler Inhale 2 puffs into the lungs every 6 (six) hours as needed for wheezing or shortness of breath. 8 g 1  . clindamycin (CLINDAGEL) 1 % gel Apply 1 application topically 2 (two) times daily. 30 g 1  .  dexamethasone (DECADRON) 4 MG tablet 1 tablet p.o. twice daily the day before, day of and day after chemotherapy every 3 weeks 20 tablet 2  . fentaNYL (DURAGESIC) 25 MCG/HR PLACE 1 PATCH ONTO THE SKIN EVERY 3 DAYS 5 patch 0  . fluticasone (FLONASE) 50 MCG/ACT nasal spray Place 2 sprays into both nostrils daily as needed for allergies. 16 g 3  . folic acid (FOLVITE) 1 MG tablet TAKE 1 TABLET BY MOUTH ONCE A DAY 30 tablet 4  . furosemide (LASIX) 40 MG tablet Take 1 tablet (40 mg total) by mouth daily. 30 tablet 3  . guaiFENesin (MUCINEX) 600 MG 12 hr tablet Take 600 mg by mouth 2 (two) times daily as needed for to loosen phlegm.     Marland Kitchen HYDROcodone-homatropine (HYCODAN) 5-1.5 MG/5ML syrup Take 5 mLs by mouth every 6 (six) hours as needed for cough. 240 mL  0  . lidocaine-prilocaine (EMLA) cream APPLY TO THE AFFECTED AREA(S) AS NEEDED 30 g 0  . linaclotide (LINZESS) 145 MCG CAPS capsule Take 1 capsule (145 mcg total) by mouth daily before breakfast. 30 capsule 6  . loperamide (IMODIUM) 1 MG/5ML solution Take 10 mLs (2 mg total) by mouth as needed for diarrhea or loose stools. (Patient taking differently: Take 2 mg by mouth daily as needed for diarrhea or loose stools. ) 120 mL 0  . loratadine (CLARITIN) 10 MG tablet Take 10 mg by mouth daily.    . magnesium oxide (MAG-OX) 400 (241.3 Mg) MG tablet Take 1 tablet (400 mg total) by mouth daily.    . methocarbamol (ROBAXIN) 500 MG tablet TAKE 1 TABLET (500 MG TOTAL) BY MOUTH EVERY 8 (EIGHT) HOURS AS NEEDED FOR MUSCLE SPASMS. 30 tablet 5  . nystatin cream (MYCOSTATIN) APPLY 1 APPLICATION TOPICALLY 2 (TWO) TIMES DAILY. (Patient taking differently: Apply 1 application topically daily as needed (skin irritaion). ) 30 g 0  . ondansetron (ZOFRAN) 8 MG tablet TAKE 1 TABLET BY MOUTH EVERY 8 HOURS AS NEEDED FOR NAUSEA OR VOMITING (Patient taking differently: Take 8 mg by mouth every 8 (eight) hours as needed for nausea or vomiting. ) 30 tablet 2  . oxyCODONE (OXY  IR/ROXICODONE) 5 MG immediate release tablet TAKE 1 TABLET BY MOUTH EVERY 6 HOURS AS NEEDED FOR PAIN 30 tablet 0  . pantoprazole (PROTONIX) 40 MG tablet Take 1 tablet (40 mg total) by mouth daily. 30 tablet 5  . potassium chloride SA (KLOR-CON) 20 MEQ tablet Take 1 tablet (20 mEq total) by mouth 2 (two) times daily. 60 tablet 5  . prochlorperazine (COMPAZINE) 10 MG tablet Take 1 tablet (10 mg total) by mouth every 6 (six) hours as needed for nausea or vomiting. 30 tablet 2  . senna-docusate (SENOKOT-S) 8.6-50 MG tablet Take 1 tablet by mouth at bedtime.    Marland Kitchen TAGRISSO 80 MG tablet TAKE 1 TABLET (80 MG TOTAL) BY MOUTH DAILY. 30 tablet 2  . traMADol (ULTRAM) 50 MG tablet Take 1 tablet (50 mg total) by mouth every 6 (six) hours as needed. 40 tablet 0  . [DISCONTINUED] TAGRISSO 80 MG tablet TAKE 1 TABLET BY MOUTH ONCE DAILY 30 tablet 0   No current facility-administered medications on file prior to visit.    Review of Systems:  As per HPI- otherwise negative. She notes that her energy level is about at her baseline  Physical Examination: There were no vitals filed for this visit. There were no vitals filed for this visit. There is no height or weight on file to calculate BMI. Ideal Body Weight:    Pt observed via video monitor- looks well, in bed. Her voice is hoarse Patient checked her temperature for me today -temp 97.4   Assessment and Plan: Chest congestion - Plan: doxycycline (VIBRA-TABS) 100 MG tablet  Virtual visit today to discuss hoarse voice in patients with advanced lung cancer.  She also has noticed cough and chest congestion.  Certainly we will try a course of antibiotics, prescribe doxycycline 100 twice daily for 10 days I advised her that if her symptoms continue she may well need endoscopy/bronchoscopy to look for any other cause for her symptoms.  I will send a note to her oncologist Dr. Julien Nordmann Video used for entirety of visit today  Signed Lamar Blinks, MD

## 2019-10-19 ENCOUNTER — Telehealth (INDEPENDENT_AMBULATORY_CARE_PROVIDER_SITE_OTHER): Payer: Medicare Other | Admitting: Family Medicine

## 2019-10-19 ENCOUNTER — Other Ambulatory Visit: Payer: Self-pay

## 2019-10-19 DIAGNOSIS — R0989 Other specified symptoms and signs involving the circulatory and respiratory systems: Secondary | ICD-10-CM | POA: Diagnosis not present

## 2019-10-19 MED ORDER — DOXYCYCLINE HYCLATE 100 MG PO TABS: 100.0000 mg | ORAL_TABLET | Freq: Two times a day (BID) | ORAL | 0 refills | Status: DC

## 2019-10-26 ENCOUNTER — Other Ambulatory Visit: Payer: Self-pay | Admitting: Medical Oncology

## 2019-10-26 ENCOUNTER — Inpatient Hospital Stay: Payer: Medicare Other

## 2019-10-26 ENCOUNTER — Encounter: Payer: Self-pay | Admitting: Internal Medicine

## 2019-10-26 ENCOUNTER — Inpatient Hospital Stay (HOSPITAL_BASED_OUTPATIENT_CLINIC_OR_DEPARTMENT_OTHER): Payer: Medicare Other | Admitting: Internal Medicine

## 2019-10-26 ENCOUNTER — Telehealth: Payer: Self-pay | Admitting: Internal Medicine

## 2019-10-26 ENCOUNTER — Other Ambulatory Visit: Payer: Self-pay

## 2019-10-26 VITALS — BP 144/89 | HR 90 | Temp 97.6°F | Resp 18 | Ht 66.0 in | Wt 190.4 lb

## 2019-10-26 VITALS — BP 148/69 | HR 104 | Temp 98.2°F | Resp 20

## 2019-10-26 DIAGNOSIS — K5090 Crohn's disease, unspecified, without complications: Secondary | ICD-10-CM | POA: Diagnosis not present

## 2019-10-26 DIAGNOSIS — I10 Essential (primary) hypertension: Secondary | ICD-10-CM

## 2019-10-26 DIAGNOSIS — Z5111 Encounter for antineoplastic chemotherapy: Secondary | ICD-10-CM

## 2019-10-26 DIAGNOSIS — Z95828 Presence of other vascular implants and grafts: Secondary | ICD-10-CM

## 2019-10-26 DIAGNOSIS — C7931 Secondary malignant neoplasm of brain: Secondary | ICD-10-CM | POA: Diagnosis not present

## 2019-10-26 DIAGNOSIS — R918 Other nonspecific abnormal finding of lung field: Secondary | ICD-10-CM | POA: Diagnosis not present

## 2019-10-26 DIAGNOSIS — C3492 Malignant neoplasm of unspecified part of left bronchus or lung: Secondary | ICD-10-CM

## 2019-10-26 DIAGNOSIS — R490 Dysphonia: Secondary | ICD-10-CM | POA: Diagnosis not present

## 2019-10-26 DIAGNOSIS — R21 Rash and other nonspecific skin eruption: Secondary | ICD-10-CM | POA: Diagnosis not present

## 2019-10-26 DIAGNOSIS — C3490 Malignant neoplasm of unspecified part of unspecified bronchus or lung: Secondary | ICD-10-CM

## 2019-10-26 DIAGNOSIS — Z5112 Encounter for antineoplastic immunotherapy: Secondary | ICD-10-CM | POA: Diagnosis not present

## 2019-10-26 LAB — CMP (CANCER CENTER ONLY)
ALT: 13 U/L (ref 0–44)
AST: 26 U/L (ref 15–41)
Albumin: 3.2 g/dL — ABNORMAL LOW (ref 3.5–5.0)
Alkaline Phosphatase: 80 U/L (ref 38–126)
Anion gap: 6 (ref 5–15)
BUN: 12 mg/dL (ref 6–20)
CO2: 30 mmol/L (ref 22–32)
Calcium: 9.9 mg/dL (ref 8.9–10.3)
Chloride: 105 mmol/L (ref 98–111)
Creatinine: 1.16 mg/dL — ABNORMAL HIGH (ref 0.44–1.00)
GFR, Est AFR Am: >60 mL/min (ref >60)
GFR, Estimated: 54 mL/min — ABNORMAL LOW (ref >60)
Glucose, Bld: 88 mg/dL (ref 70–99)
Potassium: 4 mmol/L (ref 3.5–5.1)
Sodium: 141 mmol/L (ref 135–145)
Total Bilirubin: 0.3 mg/dL (ref 0.3–1.2)
Total Protein: 7.2 g/dL (ref 6.5–8.1)

## 2019-10-26 LAB — CBC WITH DIFFERENTIAL (CANCER CENTER ONLY)
Abs Immature Granulocytes: 0.02 10*3/uL (ref 0.00–0.07)
Basophils Absolute: 0 10*3/uL (ref 0.0–0.1)
Basophils Relative: 0 %
Eosinophils Absolute: 0.1 10*3/uL (ref 0.0–0.5)
Eosinophils Relative: 1 %
HCT: 33.5 % — ABNORMAL LOW (ref 36.0–46.0)
Hemoglobin: 10.5 g/dL — ABNORMAL LOW (ref 12.0–15.0)
Immature Granulocytes: 0 %
Lymphocytes Relative: 15 %
Lymphs Abs: 1.5 10*3/uL (ref 0.7–4.0)
MCH: 31.3 pg (ref 26.0–34.0)
MCHC: 31.3 g/dL (ref 30.0–36.0)
MCV: 100 fL (ref 80.0–100.0)
Monocytes Absolute: 1.3 10*3/uL — ABNORMAL HIGH (ref 0.1–1.0)
Monocytes Relative: 13 %
Neutro Abs: 7.4 10*3/uL (ref 1.7–7.7)
Neutrophils Relative %: 71 %
Platelet Count: 314 10*3/uL (ref 150–400)
RBC: 3.35 MIL/uL — ABNORMAL LOW (ref 3.87–5.11)
RDW: 15.1 % (ref 11.5–15.5)
WBC Count: 10.4 10*3/uL (ref 4.0–10.5)
nRBC: 0 % (ref 0.0–0.2)

## 2019-10-26 LAB — TOTAL PROTEIN, URINE DIPSTICK

## 2019-10-26 MED ORDER — SODIUM CHLORIDE 0.9 % IV SOLN
Freq: Once | INTRAVENOUS | Status: AC
  Filled 2019-10-26: qty 250

## 2019-10-26 MED ORDER — PROCHLORPERAZINE MALEATE 10 MG PO TABS
10.0000 mg | ORAL_TABLET | Freq: Once | ORAL | Status: AC
  Administered 2019-10-26: 10 mg via ORAL

## 2019-10-26 MED ORDER — SODIUM CHLORIDE 0.9 % IV SOLN
400.0000 mg/m2 | Freq: Once | INTRAVENOUS | Status: AC
  Administered 2019-10-26: 800 mg via INTRAVENOUS
  Filled 2019-10-26: qty 12

## 2019-10-26 MED ORDER — CYANOCOBALAMIN 1000 MCG/ML IJ SOLN
INTRAMUSCULAR | Status: AC
  Filled 2019-10-26: qty 1

## 2019-10-26 MED ORDER — SODIUM CHLORIDE 0.9 % IV SOLN
15.0000 mg/kg | Freq: Once | INTRAVENOUS | Status: AC
  Administered 2019-10-26: 1300 mg via INTRAVENOUS
  Filled 2019-10-26: qty 48

## 2019-10-26 MED ORDER — FENTANYL 25 MCG/HR TD PT72
MEDICATED_PATCH | TRANSDERMAL | 0 refills | Status: DC
Start: 2019-10-26 — End: 2019-11-14

## 2019-10-26 MED ORDER — PROCHLORPERAZINE MALEATE 10 MG PO TABS
ORAL_TABLET | ORAL | Status: AC
  Filled 2019-10-26: qty 1

## 2019-10-26 MED ORDER — HEPARIN SOD (PORK) LOCK FLUSH 100 UNIT/ML IV SOLN
500.0000 [IU] | Freq: Once | INTRAVENOUS | Status: AC | PRN
  Administered 2019-10-26: 500 [IU]
  Filled 2019-10-26: qty 5

## 2019-10-26 MED ORDER — SODIUM CHLORIDE 0.9% FLUSH
10.0000 mL | INTRAVENOUS | Status: DC | PRN
  Administered 2019-10-26: 10 mL
  Filled 2019-10-26: qty 10

## 2019-10-26 MED FILL — FENTANYL 25 MCG/HR PT72: 25 | 15 days supply | Qty: 5 | Fill #0

## 2019-10-26 NOTE — Patient Instructions (Signed)
[facility] Discharge Instructions for Patients Receiving Chemotherapy  Today you received the following chemotherapy agents: Bevacizumab and Pemetrexed (Alimta)  To help prevent nausea and vomiting after your treatment, we encourage you to take your nausea medication as directed by your MD.   If you develop nausea and vomiting that is not controlled by your nausea medication, call the clinic.   BELOW ARE SYMPTOMS THAT SHOULD BE REPORTED IMMEDIATELY:  *FEVER GREATER THAN 100.5 F  *CHILLS WITH OR WITHOUT FEVER  NAUSEA AND VOMITING THAT IS NOT CONTROLLED WITH YOUR NAUSEA MEDICATION  *UNUSUAL SHORTNESS OF BREATH  *UNUSUAL BRUISING OR BLEEDING  TENDERNESS IN MOUTH AND THROAT WITH OR WITHOUT PRESENCE OF ULCERS  *URINARY PROBLEMS  *BOWEL PROBLEMS  UNUSUAL RASH Items with * indicate a potential emergency and should be followed up as soon as possible.  Feel free to call the clinic should you have any questions or concerns. The clinic phone number is [phone].  Please show the [facility] at check-in to the Emergency Department and triage nurse.

## 2019-10-26 NOTE — Progress Notes (Signed)
[facility] [phone]   [phone]  OFFICE PROGRESS NOTE  Copland, Gay Filler, MD [address] [place] [address]  DIAGNOSIS: DIAGNOSIS: stage IV (T3, N2, M1 a) non-small cell lung cancer, poorly differentiated adenocarcinoma with extensive miliary distribution in the lungs bilaterally diagnosed in September 2018. 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 NEGATIVE for the Exon 20 T790M mutation. Repeat molecular studies by guardant 360 at time of progression showed ATM [id number] [facility] of BRAF mutation, V600E  PRIOR THERAPY:  1) Gilotrif 40 mg daily started 11/08/2016.Status post 13 months of treatment. 2) Tagrisso 80 mg p.o. daily.  First dose started January 01, 2018.  Status post 18 months of treatment.   CURRENT THERAPY: Systemic chemotherapy with carboplatin for AUC 5, Alimta 500 mg/M2 and Avastin 15 mg/KG every 3 weeks.  First dose February 10, 2018.  Status post 12 cycles.  Starting from cycle #7 she will be on maintenance treatment with Alimta and Avastin in addition to her treatment with Tagrisso.  INTERVAL HISTORY: [name] 54y.o. female returns to the clinic today for follow-up visit.  The patient is feeling fine today with no concerning complaints except for occasional pain in the lower rib cage.  She is currently on fentanyl patch and tramadol as needed for pain management.  She denied having any shortness of breath except with exertion with no cough or hemoptysis.  She continues to have some hoarseness of her voice.  She was seen recently by her primary care physician and given a course of antibiotics.  She denied having any nausea, vomiting, diarrhea or constipation.  She has no headache or visual changes.  She has no recent weight loss or night sweats.  The patient continues to tolerate her treatment with maintenance  Alimta and Avastin fairly well.  She is here for evaluation before starting cycle #13 of her treatment.  MEDICAL HISTORY: Past Medical History:  Diagnosis Date  . Adenocarcinoma of left lung, stage 4 ([place] 10/28/2016  . Anemia   . Arthritis   . Constipation   . Crohn's disease ([place]    in remission 35 years +  . Dyspnea    gets "winded"  . GERD (gastroesophageal reflux disease)   . Goals of care, counseling/discussion 10/28/2016  . History of hiatal hernia    noted on CT 12/20  . Hypertension   . Pneumonia    walking pneumonia in the late 80's  . Recurrent pleural effusion on left     ALLERGIES:  is allergic to percocet [oxycodone-acetaminophen], lisinopril, and losartan.  MEDICATIONS:  Current Outpatient Medications  Medication Sig Dispense Refill  . albuterol (VENTOLIN HFA) 108 (90 Base) MCG/ACT inhaler Inhale 2 puffs into the lungs every 6 (six) hours as needed for wheezing or shortness of breath. 8 g 1  . clindamycin (CLINDAGEL) 1 % gel Apply 1 application topically 2 (two) times daily. 30 g 1  . dexamethasone (DECADRON) 4 MG tablet 1 tablet p.o. twice daily the day before, day of and day after chemotherapy every 3 weeks 20 tablet 2  . doxycycline (VIBRA-TABS) 100 MG tablet Take 1 tablet (100 mg total) by mouth 2 (two) times daily. 20 tablet 0  . fentaNYL (DURAGESIC) 25 MCG/HR PLACE 1 PATCH ONTO THE SKIN EVERY 3 DAYS 5 patch 0  . fluticasone (FLONASE) 50 MCG/ACT nasal spray Place 2 sprays  into both nostrils daily as needed for allergies. 16 g 3  . folic acid (FOLVITE) 1 MG tablet TAKE 1 TABLET BY MOUTH ONCE A DAY 30 tablet 4  . furosemide (LASIX) 40 MG tablet Take 1 tablet (40 mg total) by mouth daily. 30 tablet 3  . guaiFENesin (MUCINEX) 600 MG 12 hr tablet Take 600 mg by mouth 2 (two) times daily as needed for to loosen phlegm.     Marland Kitchen HYDROcodone-homatropine (HYCODAN) 5-1.5 MG/5ML syrup Take 5 mLs by mouth every 6 (six) hours as needed for cough. 240 mL 0  .  lidocaine-prilocaine (EMLA) cream APPLY TO THE AFFECTED AREA(S) AS NEEDED 30 g 0  . linaclotide (LINZESS) 145 MCG CAPS capsule Take 1 capsule (145 mcg total) by mouth daily before breakfast. 30 capsule 6  . loperamide (IMODIUM) 1 MG/5ML solution Take 10 mLs (2 mg total) by mouth as needed for diarrhea or loose stools. (Patient taking differently: Take 2 mg by mouth daily as needed for diarrhea or loose stools. ) 120 mL 0  . loratadine (CLARITIN) 10 MG tablet Take 10 mg by mouth daily.    . magnesium oxide (MAG-OX) 400 (241.3 Mg) MG tablet Take 1 tablet (400 mg total) by mouth daily.    . methocarbamol (ROBAXIN) 500 MG tablet TAKE 1 TABLET (500 MG TOTAL) BY MOUTH EVERY 8 (EIGHT) HOURS AS NEEDED FOR MUSCLE SPASMS. 30 tablet 5  . nystatin cream (MYCOSTATIN) APPLY 1 APPLICATION TOPICALLY 2 (TWO) TIMES DAILY. (Patient taking differently: Apply 1 application topically daily as needed (skin irritaion). ) 30 g 0  . ondansetron (ZOFRAN) 8 MG tablet TAKE 1 TABLET BY MOUTH EVERY 8 HOURS AS NEEDED FOR NAUSEA OR VOMITING (Patient taking differently: Take 8 mg by mouth every 8 (eight) hours as needed for nausea or vomiting. ) 30 tablet 2  . oxyCODONE (OXY IR/ROXICODONE) 5 MG immediate release tablet TAKE 1 TABLET BY MOUTH EVERY 6 HOURS AS NEEDED FOR PAIN 30 tablet 0  . pantoprazole (PROTONIX) 40 MG tablet Take 1 tablet (40 mg total) by mouth daily. 30 tablet 5  . potassium chloride SA (KLOR-CON) 20 MEQ tablet Take 1 tablet (20 mEq total) by mouth 2 (two) times daily. 60 tablet 5  . prochlorperazine (COMPAZINE) 10 MG tablet Take 1 tablet (10 mg total) by mouth every 6 (six) hours as needed for nausea or vomiting. 30 tablet 2  . senna-docusate (SENOKOT-S) 8.6-50 MG tablet Take 1 tablet by mouth at bedtime.    Marland Kitchen TAGRISSO 80 MG tablet TAKE 1 TABLET (80 MG TOTAL) BY MOUTH DAILY. 30 tablet 2  . traMADol (ULTRAM) 50 MG tablet Take 1 tablet (50 mg total) by mouth every 6 (six) hours as needed. 40 tablet 0   No current  facility-administered medications for this visit.    SURGICAL HISTORY:  Past Surgical History:  Procedure Laterality Date  . ABDOMINAL HYSTERECTOMY     partial hysterectomy  . BREAST EXCISIONAL BIOPSY Left 20+ yrs ago   benign  . CHEST TUBE INSERTION Left 10/26/2018   Procedure: INSERTION PLEURAL DRAINAGE CATHETER;  Surgeon: Ivin Poot, MD;  Location: [facility];  Service: Thoracic;  Laterality: Left;  . COLONOSCOPY    . IR IMAGING GUIDED PORT INSERTION  07/04/2019  . PERICARDIAL WINDOW N/A 01/10/2017   Procedure: PERICARDIAL WINDOW- SUB XYPHOID, RIGHT CHEST TUBE;  Surgeon: Ivin Poot, MD;  Location: [facility];  Service: Thoracic;  Laterality: N/A;  . PORT-A-CATH REMOVAL Left 05/10/2019   Procedure: REMOVAL PORT-A-CATH;  Surgeon: Prescott Gum, Collier Salina, MD;  Location: [facility];  Service: Thoracic;  Laterality: Left;  . PORTACATH PLACEMENT Left 02/21/2019   Procedure: INSERTION PORT-A-CATH;  Surgeon: Ivin Poot, MD;  Location: [facility];  Service: Thoracic;  Laterality: Left;  . REMOVAL OF PLEURAL DRAINAGE CATHETER Left 02/21/2019   Procedure: REMOVAL OF PLEURAL DRAINAGE CATHETER;  Surgeon: Ivin Poot, MD;  Location: [facility];  Service: Thoracic;  Laterality: Left;  Marland Kitchen VIDEO BRONCHOSCOPY Bilateral 10/21/2016   Procedure: VIDEO BRONCHOSCOPY WITH FLUORO;  Surgeon: Tanda Rockers, MD;  [facility];  Service: Cardiopulmonary;  Laterality: Bilateral;    REVIEW OF SYSTEMS:  A comprehensive review of systems was negative except for: Constitutional: positive for fatigue Respiratory: positive for pleurisy/chest pain   PHYSICAL EXAMINATION: General appearance: alert, cooperative, fatigued and no distress Head: Normocephalic, without obvious abnormality, atraumatic Neck: no adenopathy, no JVD, supple, symmetrical, trachea midline and thyroid not enlarged, symmetric, no tenderness/mass/nodules Lymph nodes: Cervical, supraclavicular, and axillary nodes normal. Resp: clear to auscultation  bilaterally Back: symmetric, no curvature. ROM normal. No CVA tenderness. Cardio: regular rate and rhythm, S1, S2 normal, no murmur, click, rub or gallop GI: soft, non-tender; bowel sounds normal; no masses,  no organomegaly Extremities: extremities normal, atraumatic, no cyanosis or edema  ECOG PERFORMANCE STATUS: 1 - Symptomatic but completely ambulatory  Blood pressure (!) 144/89, pulse 90, temperature 97.6 F (36.4 C), temperature source Tympanic, resp. rate 18, height 5' 6" (1.676 m), weight 190 lb 7 oz (86.4 kg), last menstrual period 03/15/2010, SpO2 100 %.  LABORATORY DATA: Lab Results  Component Value Date   WBC 10.4 10/26/2019   HGB 10.5 (L) 10/26/2019   HCT 33.5 (L) 10/26/2019   MCV 100.0 10/26/2019   PLT 314 10/26/2019      Chemistry      Component Value Date/Time   NA 140 10/04/2019 0831   NA 135 (L) 01/08/2017 1055   K 3.8 10/04/2019 0831   K 3.2 (L) 01/08/2017 1055   CL 103 10/04/2019 0831   CO2 29 10/04/2019 0831   CO2 25 01/08/2017 1055   BUN 13 10/04/2019 0831   BUN 14.6 01/08/2017 1055   CREATININE 1.29 (H) 10/04/2019 0831   CREATININE 1.3 (H) 01/08/2017 1055      Component Value Date/Time   CALCIUM 9.9 10/04/2019 0831   CALCIUM 9.5 01/08/2017 1055   ALKPHOS 77 10/04/2019 0831   ALKPHOS 82 01/08/2017 1055   AST 21 10/04/2019 0831   AST 31 01/08/2017 1055   ALT 10 10/04/2019 0831   ALT 34 01/08/2017 1055   BILITOT 0.3 10/04/2019 0831   BILITOT 1.74 (H) 01/08/2017 1055       RADIOGRAPHIC STUDIES: No results found.  ASSESSMENT AND PLAN: This is a very pleasant 54 years old African-American female with metastatic non-small cell lung cancer, adenocarcinoma and positive for EGFR mutation with deletion in exon 19.  The patient is currently on treatment with GILOTRIF 40 mg p.o. daily status post 13 months.  The patient is tolerating this treatment well except for few episodes of diarrhea and mild skin rash. She is complaining of dizzy spells and  vertigo recently. Repeat CT scan of the chest, abdomen and pelvis performed recently showed some evidence for disease progression with increase in size and number of pulmonary nodules. Her treatment was switched to Tagrisso 80 mg p.o. daily started January 01, 2018.  She is status post 12 months of treatment She had repeat MRI of the brain as well  as CT scan of the chest, abdomen pelvis performed. Her MRI of the brain showed no concerning findings for disease progression in the brain but CT scan of the chest showed some evidence for progression with lymphangitic spread of the disease. She was referred to Dr. Stinchcomb at [facility] and repeat molecular studies showed the emergence of BRAF mutation V600E. I recommended for the patient to consider systemic chemotherapy and she is here today for evaluation and discussion of this option.  I had a lengthy discussion with the patient today regarding her condition.  She understands that a combination of BRAF inhibitor and Tagrisso has not been well studied at this point. She is currently undergoing systemic chemotherapy with carboplatin for AUC of 5, Alimta 500 mg/M2 and Avastin 15 mg/KG every 3 weeks, status post 12 cycles.  Starting from cycle #7 she is on maintenance treatment with Alimta and Avastin every 3 weeks.   She continues to tolerate her treatment fairly well with no concerning adverse effects. I recommended for her to proceed with cycle #13 today as planned. She also continues her current treatment with Tagrisso 80 mg p.o. daily. For the hoarseness of her voice, we will continue to monitor for now and consider the patient for referral to ENT if she has worsening of her symptoms. I will see her back for follow-up visit in 3 weeks for evaluation after repeating MRI of the brain as well as CT scan of the chest, abdomen pelvis for restaging of her disease. For pain management I will give her a refill of fentanyl patch today. She was advised to  call immediately if she has any concerning symptoms in the interval. The patient voices understanding of current disease status and treatment options and is in agreement with the current care plan. All questions were answered. The patient knows to call the clinic with any problems, questions or concerns. We can certainly see the patient much sooner if necessary.  Disclaimer: This note was dictated with voice recognition software. Similar sounding words can inadvertently be transcribed and may not be corrected upon review.       

## 2019-10-26 NOTE — Patient Instructions (Signed)

## 2019-10-26 NOTE — Telephone Encounter (Signed)
Scheduled per 9/29 los. Pt requested to see only Dr. Julien Nordmann. Pt is aware of appt times and dates.

## 2019-11-14 ENCOUNTER — Inpatient Hospital Stay: Payer: Medicare Other | Attending: Oncology

## 2019-11-14 ENCOUNTER — Telehealth: Payer: Medicare Other | Admitting: Family Medicine

## 2019-11-14 ENCOUNTER — Telehealth: Payer: Self-pay | Admitting: Medical Oncology

## 2019-11-14 ENCOUNTER — Other Ambulatory Visit: Payer: Self-pay | Admitting: Internal Medicine

## 2019-11-14 ENCOUNTER — Other Ambulatory Visit: Payer: Self-pay | Admitting: Physician Assistant

## 2019-11-14 ENCOUNTER — Ambulatory Visit (HOSPITAL_COMMUNITY)
Admission: RE | Admit: 2019-11-14 | Discharge: 2019-11-14 | Disposition: A | Discharge status: Home / Self Care | Payer: Medicare Other | Source: Ambulatory Visit | Attending: Internal Medicine | Admitting: Internal Medicine

## 2019-11-14 ENCOUNTER — Other Ambulatory Visit (HOSPITAL_COMMUNITY): Payer: Self-pay | Admitting: Internal Medicine

## 2019-11-14 ENCOUNTER — Other Ambulatory Visit: Payer: Self-pay

## 2019-11-14 DIAGNOSIS — K219 Gastro-esophageal reflux disease without esophagitis: Secondary | ICD-10-CM | POA: Diagnosis not present

## 2019-11-14 DIAGNOSIS — Z7952 Long term (current) use of systemic steroids: Secondary | ICD-10-CM | POA: Insufficient documentation

## 2019-11-14 DIAGNOSIS — C7931 Secondary malignant neoplasm of brain: Secondary | ICD-10-CM | POA: Insufficient documentation

## 2019-11-14 DIAGNOSIS — I10 Essential (primary) hypertension: Secondary | ICD-10-CM | POA: Insufficient documentation

## 2019-11-14 DIAGNOSIS — C3490 Malignant neoplasm of unspecified part of unspecified bronchus or lung: Secondary | ICD-10-CM | POA: Insufficient documentation

## 2019-11-14 DIAGNOSIS — K5090 Crohn's disease, unspecified, without complications: Secondary | ICD-10-CM | POA: Insufficient documentation

## 2019-11-14 DIAGNOSIS — Z5111 Encounter for antineoplastic chemotherapy: Secondary | ICD-10-CM | POA: Diagnosis present

## 2019-11-14 DIAGNOSIS — C3492 Malignant neoplasm of unspecified part of left bronchus or lung: Secondary | ICD-10-CM

## 2019-11-14 DIAGNOSIS — Z95828 Presence of other vascular implants and grafts: Secondary | ICD-10-CM

## 2019-11-14 DIAGNOSIS — Z79899 Other long term (current) drug therapy: Secondary | ICD-10-CM | POA: Diagnosis not present

## 2019-11-14 DIAGNOSIS — Z5112 Encounter for antineoplastic immunotherapy: Secondary | ICD-10-CM | POA: Diagnosis present

## 2019-11-14 DIAGNOSIS — R52 Pain, unspecified: Secondary | ICD-10-CM

## 2019-11-14 MED ORDER — SODIUM CHLORIDE 0.9% FLUSH
10.0000 mL | INTRAVENOUS | Status: DC | PRN
  Administered 2019-11-14: 10 mL
  Filled 2019-11-14: qty 10

## 2019-11-14 MED ORDER — HEPARIN SOD (PORK) LOCK FLUSH 100 UNIT/ML IV SOLN: 10.0000 [IU] | Freq: Once | INTRAVENOUS | Status: DC

## 2019-11-14 MED ORDER — GADOBUTROL 1 MMOL/ML IV SOLN
10.0000 mL | Freq: Once | INTRAVENOUS | Status: AC | PRN
  Administered 2019-11-14: 8 mL via INTRAVENOUS

## 2019-11-14 MED FILL — FENTANYL 25 MCG/HR PT72: 25 | 15 days supply | Qty: 5 | Fill #0

## 2019-11-14 MED FILL — PANTOPRAZOLE SOD DR 40 MG T: 40 | 30 days supply | Qty: 30 | Fill #2

## 2019-11-14 MED FILL — POTASSIUM CHLORIDE CRYS ER: 20 | 30 days supply | Qty: 60 | Fill #2

## 2019-11-14 MED FILL — FUROSEMIDE 40 MG TAB: 40 | 30 days supply | Qty: 30 | Fill #3

## 2019-11-14 MED FILL — LINZESS 145 MCG CAPSULE: 145 | 30 days supply | Qty: 30 | Fill #5

## 2019-11-14 MED FILL — FOLIC ACID 1 MG TABS: 1 | 30 days supply | Qty: 30 | Fill #4

## 2019-11-14 MED FILL — oxyCODONE HCL 5 MG TABS: 5 | 7 days supply | Qty: 30 | Fill #0

## 2019-11-14 NOTE — Telephone Encounter (Signed)
MRI with new findings. CT scans on the 20th f/u 10/21 "IMPRESSION: Two new enhancing lesions, a 3 mm within the cerebellar vermis and a 4 mm subependymal in the left lateral ventricle, consistent with metastatic disease. No mass effect or edema." Sent to [facility]

## 2019-11-14 NOTE — Telephone Encounter (Signed)
I will address it with her on Thursday.  We will need to refer her to radiation oncology.  Thank you.

## 2019-11-16 ENCOUNTER — Other Ambulatory Visit: Payer: Self-pay

## 2019-11-16 ENCOUNTER — Inpatient Hospital Stay: Payer: Medicare Other

## 2019-11-16 ENCOUNTER — Encounter (HOSPITAL_COMMUNITY): Payer: Self-pay

## 2019-11-16 ENCOUNTER — Ambulatory Visit (HOSPITAL_COMMUNITY)
Admission: RE | Admit: 2019-11-16 | Discharge: 2019-11-16 | Disposition: A | Discharge status: Home / Self Care | Payer: Medicare Other | Source: Ambulatory Visit | Attending: Internal Medicine | Admitting: Internal Medicine

## 2019-11-16 DIAGNOSIS — Z95828 Presence of other vascular implants and grafts: Secondary | ICD-10-CM

## 2019-11-16 DIAGNOSIS — C3490 Malignant neoplasm of unspecified part of unspecified bronchus or lung: Secondary | ICD-10-CM | POA: Insufficient documentation

## 2019-11-16 DIAGNOSIS — J90 Pleural effusion, not elsewhere classified: Secondary | ICD-10-CM | POA: Diagnosis not present

## 2019-11-16 DIAGNOSIS — I313 Pericardial effusion (noninflammatory): Secondary | ICD-10-CM | POA: Diagnosis not present

## 2019-11-16 DIAGNOSIS — C7801 Secondary malignant neoplasm of right lung: Secondary | ICD-10-CM | POA: Diagnosis not present

## 2019-11-16 MED ORDER — HEPARIN SOD (PORK) LOCK FLUSH 100 UNIT/ML IV SOLN
500.0000 [IU] | Freq: Once | INTRAVENOUS | Status: AC
  Administered 2019-11-16: 500 [IU] via INTRAVENOUS

## 2019-11-16 MED ORDER — SODIUM CHLORIDE 0.9% FLUSH
10.0000 mL | INTRAVENOUS | Status: DC | PRN
  Administered 2019-11-16: 10 mL
  Filled 2019-11-16: qty 10

## 2019-11-16 MED ORDER — IOHEXOL 300 MG/ML  SOLN
100.0000 mL | Freq: Once | INTRAMUSCULAR | Status: AC | PRN
  Administered 2019-11-16: 100 mL via INTRAVENOUS

## 2019-11-16 MED ORDER — HEPARIN SOD (PORK) LOCK FLUSH 100 UNIT/ML IV SOLN
INTRAVENOUS | Status: AC
  Filled 2019-11-16: qty 5

## 2019-11-16 NOTE — Progress Notes (Signed)
Nancy Moore at [facility] [address] [place], [place] [address] [phone]  Date:  11/17/2019   Name:  Nancy Moore   DOB:  Apr 23, 1965   MRN:  [id number]  PCP:  Darreld Mclean, MD    Chief Complaint: No chief complaint on file.   History of Present Illness:  Nancy Moore is a 54 y.o. very pleasant female patient who presents with the following:  Virtual visit today for acute illness Patient location is home, provider location is office. Patient identity is confirmed with 2 factors, she gives consent for virtual visit today  We most recently had a virtual visit about 1 month ago, at that time she had concern of cough and chest congestion. I prescribed a course of doxycycline for 10 days-this did seem to help with her cough and chest symptoms.  However, voice hoarseness did not improve  She actually saw her oncologist earlier today- she did not get good news unfortunately.  Her disease has progressed, it seems that her current chemotherapy is no longer effective at suppressing her disease.  She has new lesions in her brain Plan for radiation to her brain.  They are waiting on some testing to determine what type of chemo she will need next  She has also consulted with a doctor at [facility]- they are not sure what direction she will go just yet.   She feels like there is congestion and phlegm in the back of her throat again - this is affecting her voice as well.  She does have some cough, and feels like there is something in her throat No persistent difficulty in swallowing She does have GERD and uses protonix She is interested in perhaps getting a CT scan of her neck- we wonder if she might have a mass of some sort   No fever or other acute symptoms   Patient Active Problem List   Diagnosis Date Noted  . Visit for wound check 06/01/2019  . Infection due to Port-A-Cath 05/06/2019  . Port-A-Cath in place 03/24/2019  .  Postoperative visit 03/16/2019  . Encounter for postoperative wound check 10/28/2018  . Pleural effusion 10/13/2018  . Brain metastases ([place]) 05/13/2018  . Primary malignant neoplasm of lung with metastasis to brain ([place]) 02/05/2018  . Cardiac/pericardial tamponade   . Chest tube in place   . Pain   . Acute blood loss anemia   . Palliative care encounter   . AKI (acute kidney injury) ([place]) 01/10/2017  . Hypotension 01/09/2017  . Essential hypertension 01/09/2017  . Hypokalemia 01/09/2017  . Dyspnea 01/09/2017  . Leukocytosis 01/09/2017  . Pleural effusion on right 01/08/2017  . Encounter for antineoplastic chemotherapy 11/20/2016  . Adenocarcinoma of left lung, stage 4 ([place]) 10/28/2016  . Goals of care, counseling/discussion 10/28/2016  . Upper airway cough syndrome 10/16/2016  . Multiple pulmonary nodules 10/15/2016    Past Medical History:  Diagnosis Date  . Adenocarcinoma of left lung, stage 4 ([place]) 10/28/2016  . Anemia   . Arthritis   . Constipation   . Crohn's disease ([place])    in remission 35 years +  . Dyspnea    gets "winded"  . GERD (gastroesophageal reflux disease)   . Goals of care, counseling/discussion 10/28/2016  . History of hiatal hernia    noted on CT 12/20  . Hypertension   . Pneumonia    walking pneumonia in the late 80's  . Recurrent pleural  effusion on left     Past Surgical History:  Procedure Laterality Date  . ABDOMINAL HYSTERECTOMY     partial hysterectomy  . BREAST EXCISIONAL BIOPSY Left 20+ yrs ago   benign  . CHEST TUBE INSERTION Left 10/26/2018   Procedure: INSERTION PLEURAL DRAINAGE CATHETER;  Surgeon: Ivin Poot, MD;  Location: [facility];  Service: Thoracic;  Laterality: Left;  . COLONOSCOPY    . IR IMAGING GUIDED PORT INSERTION  07/04/2019  . PERICARDIAL WINDOW N/A 01/10/2017   Procedure: PERICARDIAL WINDOW- SUB XYPHOID, RIGHT CHEST TUBE;  Surgeon: Ivin Poot, MD;  Location: [facility];  Service: Thoracic;  Laterality: N/A;  .  PORT-A-CATH REMOVAL Left 05/10/2019   Procedure: REMOVAL PORT-A-CATH;  Surgeon: Ivin Poot, MD;  Location: [facility];  Service: Thoracic;  Laterality: Left;  . PORTACATH PLACEMENT Left 02/21/2019   Procedure: INSERTION PORT-A-CATH;  Surgeon: Ivin Poot, MD;  Location: [facility];  Service: Thoracic;  Laterality: Left;  . REMOVAL OF PLEURAL DRAINAGE CATHETER Left 02/21/2019   Procedure: REMOVAL OF PLEURAL DRAINAGE CATHETER;  Surgeon: Ivin Poot, MD;  Location: [facility];  Service: Thoracic;  Laterality: Left;  Marland Kitchen VIDEO BRONCHOSCOPY Bilateral 10/21/2016   Procedure: VIDEO BRONCHOSCOPY WITH FLUORO;  Surgeon: Tanda Rockers, MD;  Location: [facility];  Service: Cardiopulmonary;  Laterality: Bilateral;    Social History   Tobacco Use  . Smoking status: Never Smoker  . Smokeless tobacco: Never Used  Vaping Use  . Vaping Use: Never used  Substance Use Topics  . Alcohol use: Not Currently    Comment: socially  . Drug use: No    Family History  Problem Relation Age of Onset  . Asthma Mother   . Stroke Mother   . Hypertension Mother   . Heart attack Father   . Hypertension Father   . Hyperlipidemia Father   . Dementia Father   . Emphysema Maternal Grandmother   . Hypertension Maternal Grandmother   . Colon cancer Paternal Grandmother   . Brain cancer Maternal Uncle     Allergies  Allergen Reactions  . Percocet [Oxycodone-Acetaminophen] Other (See Comments)    "makes me dizzy" / takes TID at home  . Lisinopril Cough  . Losartan Cough    Medication list has been reviewed and updated.  Current Outpatient Medications on File Prior to Visit  Medication Sig Dispense Refill  . albuterol (VENTOLIN HFA) 108 (90 Base) MCG/ACT inhaler Inhale 2 puffs into the lungs every 6 (six) hours as needed for wheezing or shortness of breath. 8 g 1  . clindamycin (CLINDAGEL) 1 % gel Apply 1 application topically 2 (two) times daily. 30 g 1  . dexamethasone (DECADRON) 4 MG tablet 1 tablet p.o.  twice daily the day before, day of and day after chemotherapy every 3 weeks 20 tablet 2  . fentaNYL (DURAGESIC) 50 MCG/HR Place 1 patch onto the skin every 3 (three) days. 10 patch 0  . fluticasone (FLONASE) 50 MCG/ACT nasal spray Place 2 sprays into both nostrils daily as needed for allergies. 16 g 3  . folic acid (FOLVITE) 1 MG tablet TAKE 1 TABLET BY MOUTH ONCE A DAY 30 tablet 4  . furosemide (LASIX) 40 MG tablet Take 1 tablet (40 mg total) by mouth daily. 30 tablet 3  . guaiFENesin (MUCINEX) 600 MG 12 hr tablet Take 600 mg by mouth 2 (two) times daily as needed for to loosen phlegm.     Marland Kitchen HYDROcodone-homatropine (HYCODAN) 5-1.5 MG/5ML syrup Take  5 mLs by mouth every 6 (six) hours as needed for cough. 240 mL 0  . lidocaine-prilocaine (EMLA) cream APPLY TO THE AFFECTED AREA(S) AS NEEDED 30 g 0  . linaclotide (LINZESS) 145 MCG CAPS capsule Take 1 capsule (145 mcg total) by mouth daily before breakfast. 30 capsule 6  . loperamide (IMODIUM) 1 MG/5ML solution Take 10 mLs (2 mg total) by mouth as needed for diarrhea or loose stools. (Patient taking differently: Take 2 mg by mouth daily as needed for diarrhea or loose stools. ) 120 mL 0  . loratadine (CLARITIN) 10 MG tablet Take 10 mg by mouth daily.    . magnesium oxide (MAG-OX) 400 (241.3 Mg) MG tablet Take 1 tablet (400 mg total) by mouth daily.    . methocarbamol (ROBAXIN) 500 MG tablet TAKE 1 TABLET (500 MG TOTAL) BY MOUTH EVERY 8 (EIGHT) HOURS AS NEEDED FOR MUSCLE SPASMS. 30 tablet 5  . nystatin cream (MYCOSTATIN) APPLY 1 APPLICATION TOPICALLY 2 (TWO) TIMES DAILY. (Patient taking differently: Apply 1 application topically daily as needed (skin irritaion). ) 30 g 0  . ondansetron (ZOFRAN) 8 MG tablet TAKE 1 TABLET BY MOUTH EVERY 8 HOURS AS NEEDED FOR NAUSEA OR VOMITING (Patient taking differently: Take 8 mg by mouth every 8 (eight) hours as needed for nausea or vomiting. ) 30 tablet 2  . oxyCODONE (OXY IR/ROXICODONE) 5 MG immediate release tablet  TAKE 1 TABLET BY MOUTH EVERY 6 HOURS AS NEEDED FOR PAIN 30 tablet 0  . pantoprazole (PROTONIX) 40 MG tablet Take 1 tablet (40 mg total) by mouth daily. 30 tablet 5  . potassium chloride SA (KLOR-CON) 20 MEQ tablet Take 1 tablet (20 mEq total) by mouth 2 (two) times daily. 60 tablet 5  . prochlorperazine (COMPAZINE) 10 MG tablet Take 1 tablet (10 mg total) by mouth every 6 (six) hours as needed for nausea or vomiting. 30 tablet 2  . senna-docusate (SENOKOT-S) 8.6-50 MG tablet Take 1 tablet by mouth at bedtime.    Marland Kitchen TAGRISSO 80 MG tablet TAKE 1 TABLET (80 MG TOTAL) BY MOUTH DAILY. 30 tablet 2  . traMADol (ULTRAM) 50 MG tablet Take 1 tablet (50 mg total) by mouth every 6 (six) hours as needed. 40 tablet 0  . [DISCONTINUED] TAGRISSO 80 MG tablet TAKE 1 TABLET BY MOUTH ONCE DAILY 30 tablet 0   No current facility-administered medications on file prior to visit.    Review of Systems:  As per HPI- otherwise negative.   Physical Examination: There were no vitals filed for this visit. There were no vitals filed for this visit. There is no height or weight on file to calculate BMI. Ideal Body Weight:      Vitals from her hematology visit earlier today  Blood pressure 136/89, pulse 99, temperature 97.8 F (36.6 C), temperature source Tympanic, resp. rate 18, height 5' 6"  (1.676 m), weight 188 lb 6.4 oz (85.5 kg), last menstrual period 03/15/2010, SpO2 97 %.  Patient observed on video monitor.  She looks well, her normal self.  She does have a somewhat hoarse voice   Assessment and Plan: Change in voice  Chest congestion - Plan: doxycycline (VIBRA-TABS) 100 MG tablet   Virtual visit today with a video to discuss concern of a feeling of something in her throat, and recurrent cough.  Patient has advanced lung cancer, she is somewhat worried that this could represent a mass in her throat or neck which is a reasonable concern.  I will touch base with her  oncologist about ordering a CT study for  her in case he has any particular recommendation Patient would like her study done at [facility] At this point she does not feel bad in particular, would like to repeat a course of doxycycline to see if it might help her cough which is reasonable given her advanced cancer.  Send in prescription for her    Signed Lamar Blinks, MD

## 2019-11-16 NOTE — Patient Instructions (Signed)

## 2019-11-17 ENCOUNTER — Encounter: Payer: Self-pay | Admitting: *Deleted

## 2019-11-17 ENCOUNTER — Inpatient Hospital Stay: Payer: Medicare Other

## 2019-11-17 ENCOUNTER — Other Ambulatory Visit: Payer: Self-pay | Admitting: Radiation Therapy

## 2019-11-17 ENCOUNTER — Other Ambulatory Visit: Payer: Self-pay

## 2019-11-17 ENCOUNTER — Encounter: Payer: Self-pay | Admitting: Internal Medicine

## 2019-11-17 ENCOUNTER — Inpatient Hospital Stay (HOSPITAL_BASED_OUTPATIENT_CLINIC_OR_DEPARTMENT_OTHER): Payer: Medicare Other | Admitting: Internal Medicine

## 2019-11-17 ENCOUNTER — Telehealth (INDEPENDENT_AMBULATORY_CARE_PROVIDER_SITE_OTHER): Payer: Medicare Other | Admitting: Family Medicine

## 2019-11-17 ENCOUNTER — Other Ambulatory Visit (HOSPITAL_COMMUNITY): Payer: Self-pay | Admitting: Family Medicine

## 2019-11-17 VITALS — BP 136/89 | HR 99 | Temp 97.8°F | Resp 18 | Ht 66.0 in | Wt 188.4 lb

## 2019-11-17 DIAGNOSIS — R0989 Other specified symptoms and signs involving the circulatory and respiratory systems: Secondary | ICD-10-CM

## 2019-11-17 DIAGNOSIS — Z5111 Encounter for antineoplastic chemotherapy: Secondary | ICD-10-CM

## 2019-11-17 DIAGNOSIS — C3490 Malignant neoplasm of unspecified part of unspecified bronchus or lung: Secondary | ICD-10-CM | POA: Diagnosis not present

## 2019-11-17 DIAGNOSIS — I10 Essential (primary) hypertension: Secondary | ICD-10-CM | POA: Diagnosis not present

## 2019-11-17 DIAGNOSIS — K219 Gastro-esophageal reflux disease without esophagitis: Secondary | ICD-10-CM | POA: Diagnosis not present

## 2019-11-17 DIAGNOSIS — C3492 Malignant neoplasm of unspecified part of left bronchus or lung: Secondary | ICD-10-CM | POA: Diagnosis not present

## 2019-11-17 DIAGNOSIS — R499 Unspecified voice and resonance disorder: Secondary | ICD-10-CM

## 2019-11-17 DIAGNOSIS — Z79899 Other long term (current) drug therapy: Secondary | ICD-10-CM | POA: Diagnosis not present

## 2019-11-17 DIAGNOSIS — Z95828 Presence of other vascular implants and grafts: Secondary | ICD-10-CM

## 2019-11-17 DIAGNOSIS — C7931 Secondary malignant neoplasm of brain: Secondary | ICD-10-CM | POA: Diagnosis not present

## 2019-11-17 DIAGNOSIS — Z7952 Long term (current) use of systemic steroids: Secondary | ICD-10-CM | POA: Diagnosis not present

## 2019-11-17 DIAGNOSIS — Z7189 Other specified counseling: Secondary | ICD-10-CM | POA: Diagnosis not present

## 2019-11-17 DIAGNOSIS — K5090 Crohn's disease, unspecified, without complications: Secondary | ICD-10-CM | POA: Diagnosis not present

## 2019-11-17 LAB — CMP (CANCER CENTER ONLY)
ALT: <6 U/L (ref 0–44)
AST: 20 U/L (ref 15–41)
Albumin: 3.2 g/dL — ABNORMAL LOW (ref 3.5–5.0)
Alkaline Phosphatase: 87 U/L (ref 38–126)
Anion gap: 11 (ref 5–15)
BUN: 13 mg/dL (ref 6–20)
CO2: 26 mmol/L (ref 22–32)
Calcium: 10.1 mg/dL (ref 8.9–10.3)
Chloride: 103 mmol/L (ref 98–111)
Creatinine: 1.21 mg/dL — ABNORMAL HIGH (ref 0.44–1.00)
GFR, Estimated: 54 mL/min — ABNORMAL LOW (ref >60)
Glucose, Bld: 141 mg/dL — ABNORMAL HIGH (ref 70–99)
Potassium: 3.8 mmol/L (ref 3.5–5.1)
Sodium: 140 mmol/L (ref 135–145)
Total Bilirubin: 0.4 mg/dL (ref 0.3–1.2)
Total Protein: 7.7 g/dL (ref 6.5–8.1)

## 2019-11-17 LAB — CBC WITH DIFFERENTIAL (CANCER CENTER ONLY)
Abs Immature Granulocytes: 0.02 10*3/uL (ref 0.00–0.07)
Basophils Absolute: 0 10*3/uL (ref 0.0–0.1)
Basophils Relative: 0 %
Eosinophils Absolute: 0 10*3/uL (ref 0.0–0.5)
Eosinophils Relative: 0 %
HCT: 34 % — ABNORMAL LOW (ref 36.0–46.0)
Hemoglobin: 10.7 g/dL — ABNORMAL LOW (ref 12.0–15.0)
Immature Granulocytes: 0 %
Lymphocytes Relative: 18 %
Lymphs Abs: 1.3 10*3/uL (ref 0.7–4.0)
MCH: 31.4 pg (ref 26.0–34.0)
MCHC: 31.5 g/dL (ref 30.0–36.0)
MCV: 99.7 fL (ref 80.0–100.0)
Monocytes Absolute: 0.7 10*3/uL (ref 0.1–1.0)
Monocytes Relative: 9 %
Neutro Abs: 5.3 10*3/uL (ref 1.7–7.7)
Neutrophils Relative %: 73 %
Platelet Count: 346 10*3/uL (ref 150–400)
RBC: 3.41 MIL/uL — ABNORMAL LOW (ref 3.87–5.11)
RDW: 15.3 % (ref 11.5–15.5)
WBC Count: 7.2 10*3/uL (ref 4.0–10.5)
nRBC: 0 % (ref 0.0–0.2)

## 2019-11-17 LAB — TOTAL PROTEIN, URINE DIPSTICK: Protein, ur: 30 mg/dL — AB

## 2019-11-17 MED ORDER — SODIUM CHLORIDE 0.9% FLUSH
10.0000 mL | INTRAVENOUS | Status: DC | PRN
  Administered 2019-11-17: 10 mL
  Filled 2019-11-17: qty 10

## 2019-11-17 MED ORDER — FENTANYL 50 MCG/HR TD PT72
1.0000 | MEDICATED_PATCH | TRANSDERMAL | 0 refills | Status: DC
Start: 2019-11-17 — End: 2020-02-13

## 2019-11-17 MED ORDER — DOXYCYCLINE HYCLATE 100 MG PO TABS: 100.0000 mg | ORAL_TABLET | Freq: Two times a day (BID) | ORAL | 0 refills | Status: DC

## 2019-11-17 MED ORDER — HEPARIN SOD (PORK) LOCK FLUSH 100 UNIT/ML IV SOLN
500.0000 [IU] | Freq: Once | INTRAVENOUS | Status: AC | PRN
  Administered 2019-11-17: 500 [IU]
  Filled 2019-11-17: qty 5

## 2019-11-17 MED FILL — DOXYCYCLINE HYCLATE 100 MG: 100 | 10 days supply | Qty: 20 | Fill #0

## 2019-11-17 NOTE — Patient Instructions (Signed)

## 2019-11-17 NOTE — Progress Notes (Signed)
I saw Nancy Moore today.  Unfortunately, she has disease progression.  I offered encouragement and sympathy.  I will reach out to CSW to follow up with her.

## 2019-11-17 NOTE — Progress Notes (Signed)
[facility] [phone]   [phone]  OFFICE PROGRESS NOTE  Copland, Gay Filler, MD [address] [place] [address]  DIAGNOSIS: DIAGNOSIS: stage IV (T3, N2, M1 a) non-small cell lung cancer, poorly differentiated adenocarcinoma with extensive miliary distribution in the lungs bilaterally diagnosed in September 2018. 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 NEGATIVE for the Exon 20 T790M mutation. Repeat molecular studies by guardant 360 at time of progression showed ATM [id number] [facility] of BRAF mutation, V600E  PRIOR THERAPY:  1) Gilotrif 40 mg daily started 11/08/2016.Status post 13 months of treatment. 2) Tagrisso 80 mg p.o. daily.  First dose started January 01, 2018.  Status post 18 months of treatment.   CURRENT THERAPY: Systemic chemotherapy with carboplatin for AUC 5, Alimta 500 mg/M2 and Avastin 15 mg/KG every 3 weeks.  First dose February 10, 2018.  Status post 13 cycles.  Starting from cycle #7 she will be on maintenance treatment with Alimta and Avastin in addition to her treatment with Tagrisso.  INTERVAL HISTORY: [name] 54y.o. female returns to the clinic today for follow-up visit accompanied by her sister.  The patient is feeling fine today with no concerning issues except for the rib pain as well as intermittent back pain.  She denied having any chest pain, shortness of breath, cough or hemoptysis.  She denied having any fever or chills.  She has no nausea, vomiting, diarrhea or constipation.  She has no headache or visual changes.  She has been tolerating her treatment with maintenance chemotherapy fairly well.  The patient had MRI of the brain as well as CT scan of the chest, abdomen pelvis performed recently and she is here for evaluation and discussion of her risk her results.  MEDICAL HISTORY: Past Medical  History:  Diagnosis Date  . Aden[place] of [place] lung, stage 4 ([place]  . Anemia   . Arthritis   . Constipation   . Crohn's disease ([place]    in remission 35 years +  . Dyspnea    gets "winded"  . GERD (gastroesophageal reflux disease)   . Goals of care, counseling/discussion 10/28/2016  . History of hiatal hernia    noted on CT 12/20  . Hypertension   . Pneumonia    walking pneumonia in the late 80's  . Recurrent pleural effusion on left     ALLERGIES:  is allergic to percocet [oxycodone-acetaminophen], lisinopril, and losartan.  MEDICATIONS:  Current Outpatient Medications  Medication Sig Dispense Refill  . albuterol (VENTOLIN HFA) 108 (90 Base) MCG/ACT inhaler Inhale 2 puffs into the lungs every 6 (six) hours as needed for wheezing or shortness of breath. 8 g 1  . clindamycin (CLINDAGEL) 1 % gel Apply 1 application topically 2 (two) times daily. 30 g 1  . dexamethasone (DECADRON) 4 MG tablet 1 tablet p.o. twice daily the day before, day of and day after chemotherapy every 3 weeks 20 tablet 2  . doxycycline (VIBRA-TABS) 100 MG tablet Take 1 tablet (100 mg total) by mouth 2 (two) times daily. 20 tablet 0  . fentaNYL (DURAGESIC) 25 MCG/HR PLACE 1 PATCH ONTO THE SKIN EVERY 3 DAYS 5 patch 0  . fluticasone (FLONASE) 50 MCG/ACT nasal spray Place 2 sprays into both nostrils daily as needed for allergies. 16 g 3  . folic acid (FOLVITE) 1 MG tablet TAKE 1 TABLET BY  MOUTH ONCE A DAY 30 tablet 4  . furosemide (LASIX) 40 MG tablet Take 1 tablet (40 mg total) by mouth daily. 30 tablet 3  . guaiFENesin (MUCINEX) 600 MG 12 hr tablet Take 600 mg by mouth 2 (two) times daily as needed for to loosen phlegm.     Marland Kitchen HYDROcodone-homatropine (HYCODAN) 5-1.5 MG/5ML syrup Take 5 mLs by mouth every 6 (six) hours as needed for cough. 240 mL 0  . lidocaine-prilocaine (EMLA) cream APPLY TO THE AFFECTED AREA(S) AS NEEDED 30 g 0  . linaclotide (LINZESS) 145 MCG CAPS capsule Take 1 capsule (145 mcg  total) by mouth daily before breakfast. 30 capsule 6  . loperamide (IMODIUM) 1 MG/5ML solution Take 10 mLs (2 mg total) by mouth as needed for diarrhea or loose stools. (Patient taking differently: Take 2 mg by mouth daily as needed for diarrhea or loose stools. ) 120 mL 0  . loratadine (CLARITIN) 10 MG tablet Take 10 mg by mouth daily.    . magnesium oxide (MAG-OX) 400 (241.3 Mg) MG tablet Take 1 tablet (400 mg total) by mouth daily.    . methocarbamol (ROBAXIN) 500 MG tablet TAKE 1 TABLET (500 MG TOTAL) BY MOUTH EVERY 8 (EIGHT) HOURS AS NEEDED FOR MUSCLE SPASMS. 30 tablet 5  . nystatin cream (MYCOSTATIN) APPLY 1 APPLICATION TOPICALLY 2 (TWO) TIMES DAILY. (Patient taking differently: Apply 1 application topically daily as needed (skin irritaion). ) 30 g 0  . ondansetron (ZOFRAN) 8 MG tablet TAKE 1 TABLET BY MOUTH EVERY 8 HOURS AS NEEDED FOR NAUSEA OR VOMITING (Patient taking differently: Take 8 mg by mouth every 8 (eight) hours as needed for nausea or vomiting. ) 30 tablet 2  . oxyCODONE (OXY IR/ROXICODONE) 5 MG immediate release tablet TAKE 1 TABLET BY MOUTH EVERY 6 HOURS AS NEEDED FOR PAIN 30 tablet 0  . pantoprazole (PROTONIX) 40 MG tablet Take 1 tablet (40 mg total) by mouth daily. 30 tablet 5  . potassium chloride SA (KLOR-CON) 20 MEQ tablet Take 1 tablet (20 mEq total) by mouth 2 (two) times daily. 60 tablet 5  . prochlorperazine (COMPAZINE) 10 MG tablet Take 1 tablet (10 mg total) by mouth every 6 (six) hours as needed for nausea or vomiting. 30 tablet 2  . senna-docusate (SENOKOT-S) 8.6-50 MG tablet Take 1 tablet by mouth at bedtime.    Marland Kitchen TAGRISSO 80 MG tablet TAKE 1 TABLET (80 MG TOTAL) BY MOUTH DAILY. 30 tablet 2  . traMADol (ULTRAM) 50 MG tablet Take 1 tablet (50 mg total) by mouth every 6 (six) hours as needed. 40 tablet 0   No current facility-administered medications for this visit.    SURGICAL HISTORY:  Past Surgical History:  Procedure Laterality Date  . ABDOMINAL HYSTERECTOMY      partial hysterectomy  . BREAST EXCISIONAL BIOPSY Left 20+ yrs ago   benign  . CHEST TUBE INSERTION Left 10/26/2018   Procedure: INSERTION PLEURAL DRAINAGE CATHETER;  Surgeon: Ivin Poot, MD;  Location: [facility];  Service: Thoracic;  Laterality: Left;  . COLONOSCOPY    . IR IMAGING GUIDED PORT INSERTION  07/04/2019  . PERICARDIAL WINDOW N/A 01/10/2017   Procedure: PERICARDIAL WINDOW- SUB XYPHOID, RIGHT CHEST TUBE;  Surgeon: Ivin Poot, MD;  Location: [facility];  Service: Thoracic;  Laterality: N/A;  . PORT-A-CATH REMOVAL Left 05/10/2019   Procedure: REMOVAL PORT-A-CATH;  Surgeon: Ivin Poot, MD;  Location: [facility];  Service: Thoracic;  Laterality: Left;  . PORTACATH PLACEMENT Left 02/21/2019  Procedure: INSERTION PORT-A-CATH;  Surgeon: Prescott Gum, Collier Salina, MD;  Location: [facility];  Service: Thoracic;  Laterality: Left;  . REMOVAL OF PLEURAL DRAINAGE CATHETER Left 02/21/2019   Procedure: REMOVAL OF PLEURAL DRAINAGE CATHETER;  Surgeon: Ivin Poot, MD;  Location: [facility];  Service: Thoracic;  Laterality: Left;  Marland Kitchen VIDEO BRONCHOSCOPY Bilateral 10/21/2016   Procedure: VIDEO BRONCHOSCOPY WITH FLUORO;  Surgeon: Tanda Rockers, MD;  Location: [facility];  Service: Cardiopulmonary;  Laterality: Bilateral;    REVIEW OF SYSTEMS:  Constitutional: positive for fatigue Eyes: negative Ears, nose, mouth, throat, and face: negative Respiratory: negative Cardiovascular: negative Gastrointestinal: positive for nausea Genitourinary:negative Integument/breast: negative Hematologic/lymphatic: negative Musculoskeletal:positive for back pain Neurological: negative Behavioral/Psych: negative Endocrine: negative Allergic/Immunologic: negative   PHYSICAL EXAMINATION: General appearance: alert, cooperative, fatigued and no distress Head: Normocephalic, without obvious abnormality, atraumatic Neck: no adenopathy, no JVD, supple, symmetrical, trachea midline and thyroid not enlarged, symmetric, no  tenderness/mass/nodules Lymph nodes: Cervical, supraclavicular, and axillary nodes normal. Resp: clear to auscultation bilaterally Back: symmetric, no curvature. ROM normal. No CVA tenderness. Cardio: regular rate and rhythm, S1, S2 normal, no murmur, click, rub or gallop GI: soft, non-tender; bowel sounds normal; no masses,  no organomegaly Extremities: extremities normal, atraumatic, no cyanosis or edema Neurologic: Alert and oriented X 3, normal strength and tone. Normal symmetric reflexes. Normal coordination and gait  ECOG PERFORMANCE STATUS: 1 - Symptomatic but completely ambulatory  Blood pressure 136/89, pulse 99, temperature 97.8 F (36.6 C), temperature source Tympanic, resp. rate 18, height _0  (1.676 m), weight 188 lb 6.4 oz (85.5 kg), last menstrual period 03/15/2010, SpO2 97 %.  LABORATORY DATA: Lab Results  Component Value Date   WBC 7.2 11/17/2019   HGB 10.7 (L) 11/17/2019   HCT 34.0 (L) 11/17/2019   MCV 99.7 11/17/2019   PLT 346 11/17/2019      Chemistry      Component Value Date/Time   NA 140 11/17/2019 1139   NA 135 (L) 01/08/2017 1055   K 3.8 11/17/2019 1139   K 3.2 (L) 01/08/2017 1055   CL 103 11/17/2019 1139   CO2 26 11/17/2019 1139   CO2 25 01/08/2017 1055   BUN 13 11/17/2019 1139   BUN 14.6 01/08/2017 1055   CREATININE 1.21 (H) 11/17/2019 1139   CREATININE 1.3 (H) 01/08/2017 1055      Component Value Date/Time   CALCIUM 10.1 11/17/2019 1139   CALCIUM 9.5 01/08/2017 1055   ALKPHOS 87 11/17/2019 1139   ALKPHOS 82 01/08/2017 1055   AST 20 11/17/2019 1139   AST 31 01/08/2017 1055   ALT PENDING 11/17/2019 1139   ALT 34 01/08/2017 1055   BILITOT 0.4 11/17/2019 1139   BILITOT 1.74 (H) 01/08/2017 1055       RADIOGRAPHIC STUDIES: CT Chest W Contrast  Result Date: 11/16/2019 CLINICAL DATA:  Primary Cancer Type: Lung Imaging Indication: Routine surveillance Interval therapy since last imaging? Yes Initial Cancer Diagnosis Date: 10/21/2016;  established by: Biopsy-proven Detailed Pathology: Stage IV non-small cell lung cancer, poorly differentiated adenocarcinoma. Primary Tumor location: Extensive miliary distribution bilaterally. Metastases to brain. Surgeries: History of pleural catheters.  Hysterectomy. Chemotherapy: Yes; Ongoing? Yes; Most recent administration: 10/26/2019 Alimta and Avastin; Tagrisso daily. Immunotherapy? No Radiation therapy? No Other Cancer Therapies: Prior Gilotrif. EXAM: CT CHEST, ABDOMEN, AND PELVIS WITH CONTRAST TECHNIQUE: Multidetector CT imaging of the chest, abdomen and pelvis was performed following the standard protocol during bolus administration of intravenous contrast. CONTRAST:  147m OMNIPAQUE IOHEXOL 300 MG/ML  SOLN  COMPARISON:  Most recent CT chest, abdomen and pelvis 08/22/2019. 10/31/2016 PET-CT. FINDINGS: CT CHEST FINDINGS Cardiovascular: Port in the anterior chest wall with tip in distal SVC. Small pericardial effusion. Mediastinum/Nodes: No axillary supraclavicular adenopathy. No clear mediastinal adenopathy. Lungs/Pleura: Multiple round pulmonary nodules of varying size are increased comparison exam. Example: Nodule in the LEFT upper lobe along the pleural surface measuring 13 mm (image 57/series 6) increased from 4 mm. RIGHT lower lobe nodule medially measuring 13 mm (image 100) increased from 4 mm. Nodule in the azygoesophageal recess measuring 7 mm (image 106) increased from 2 mm. There approximately 50 nodules within the lung. There is focus consolidation in the medial LEFT lower lobe versus atelectasis and small effusion which is similar prior. Musculoskeletal: No aggressive osseous lesion. CT ABDOMEN AND PELVIS FINDINGS Hepatobiliary: No focal hepatic lesion. No biliary ductal dilatation. Gallbladder is normal. Common bile duct is normal. Pancreas: Pancreas normal. Spleen: Normal spleen Adrenals/urinary tract: Adrenal glands and kidneys are normal. The ureters and bladder normal. Stomach/Bowel:  Stomach, small bowel, appendix, and cecum are normal. The colon and rectosigmoid colon are normal. Vascular/Lymphatic: Abdominal aorta is normal caliber. There is no retroperitoneal or periportal lymphadenopathy. No pelvic lymphadenopathy. Reproductive: Post hysterectomy.  Adnexa unremarkable Other: Ino free-fluid.  No peritoneal metastasis. Musculoskeletal: No aggressive osseous lesion. IMPRESSION: Chest Impression: 1. Multiple bilateral pulmonary metastasis increased significantly in size from comparison exam 08/22/2019. Approximately 50 nodules per lung. 2. No clear mediastinal adenopathy. 3. Small pericardial effusion. 4. Small LEFT effusion and basilar atelectasis Abdomen / Pelvis Impression: 1. No evidence of metastatic disease in the abdomen pelvis. 2. No skeletal metastasis Electronically Signed   By: Suzy Bouchard M.D.   On: 11/16/2019 12:09   MR Brain W Wo Contrast  Result Date: 11/14/2019 CLINICAL DATA:  Non-small cell lung cancer, staging. EXAM: MRI HEAD WITHOUT AND WITH CONTRAST TECHNIQUE: Multiplanar, multiecho pulse sequences of the brain and surrounding structures were obtained without and with intravenous contrast. CONTRAST:  41m GADAVIST GADOBUTROL 1 MMOL/ML IV SOLN COMPARISON:  MRI of the brain August 25, 2019. FINDINGS: Brain: No acute infarction, hemorrhage, hydrocephalus, or extra-axial collection. A 3 mm focus of faint contrast enhancement within the cerebellar vermis (series 16, image 9; series 17, image 12 and series 18, image 21) and a 4 mm subependymal focus of contrast enhancement in the left lateral ventricle (series 16, image 13; series 18, image 87). No mass effect or edema. Multiple scattered foci of susceptibility artifact are again seen, corresponding to posttreatment changes. Vascular: Normal flow voids. Skull and upper cervical spine: Normal marrow signal. Sinuses/Orbits: Mucosal thickening with mucous retention cysts within the right maxillary sinus. The orbits are  maintained Other: None. IMPRESSION: Two new enhancing lesions, a 3 mm within the cerebellar vermis and a 4 mm subependymal in the left lateral ventricle, consistent with metastatic disease. No mass effect or edema. These results will be called to the ordering clinician or representative by the Radiologist Assistant, and communication documented in the PACS or [employer] Electronically Signed   By: KPedro EarlsM.D.   On: 11/14/2019 12:46   CT Abdomen Pelvis W Contrast  Result Date: 11/16/2019 CLINICAL DATA:  Primary Cancer Type: Lung Imaging Indication: Routine surveillance Interval therapy since last imaging? Yes Initial Cancer Diagnosis Date: 10/21/2016; established by: Biopsy-proven Detailed Pathology: Stage IV non-small cell lung cancer, poorly differentiated adenocarcinoma. Primary Tumor location: Extensive miliary distribution bilaterally. Metastases to brain. Surgeries: History of pleural catheters.  Hysterectomy. Chemotherapy: Yes; Ongoing? Yes;  Most recent administration: 10/26/2019 Alimta and Avastin; Tagrisso daily. Immunotherapy? No Radiation therapy? No Other Cancer Therapies: Prior Gilotrif. EXAM: CT CHEST, ABDOMEN, AND PELVIS WITH CONTRAST TECHNIQUE: Multidetector CT imaging of the chest, abdomen and pelvis was performed following the standard protocol during bolus administration of intravenous contrast. CONTRAST:  127m OMNIPAQUE IOHEXOL 300 MG/ML  SOLN COMPARISON:  Most recent CT chest, abdomen and pelvis 08/22/2019. 10/31/2016 PET-CT. FINDINGS: CT CHEST FINDINGS Cardiovascular: Port in the anterior chest wall with tip in distal SVC. Small pericardial effusion. Mediastinum/Nodes: No axillary supraclavicular adenopathy. No clear mediastinal adenopathy. Lungs/Pleura: Multiple round pulmonary nodules of varying size are increased comparison exam. Example: Nodule in the LEFT upper lobe along the pleural surface measuring 13 mm (image 57/series 6) increased from 4 mm. RIGHT  lower lobe nodule medially measuring 13 mm (image 100) increased from 4 mm. Nodule in the azygoesophageal recess measuring 7 mm (image 106) increased from 2 mm. There approximately 50 nodules within the lung. There is focus consolidation in the medial LEFT lower lobe versus atelectasis and small effusion which is similar prior. Musculoskeletal: No aggressive osseous lesion. CT ABDOMEN AND PELVIS FINDINGS Hepatobiliary: No focal hepatic lesion. No biliary ductal dilatation. Gallbladder is normal. Common bile duct is normal. Pancreas: Pancreas normal. Spleen: Normal spleen Adrenals/urinary tract: Adrenal glands and kidneys are normal. The ureters and bladder normal. Stomach/Bowel: Stomach, small bowel, appendix, and cecum are normal. The colon and rectosigmoid colon are normal. Vascular/Lymphatic: Abdominal aorta is normal caliber. There is no retroperitoneal or periportal lymphadenopathy. No pelvic lymphadenopathy. Reproductive: Post hysterectomy.  Adnexa unremarkable Other: Ino free-fluid.  No peritoneal metastasis. Musculoskeletal: No aggressive osseous lesion. IMPRESSION: Chest Impression: 1. Multiple bilateral pulmonary metastasis increased significantly in size from comparison exam 08/22/2019. Approximately 50 nodules per lung. 2. No clear mediastinal adenopathy. 3. Small pericardial effusion. 4. Small LEFT effusion and basilar atelectasis Abdomen / Pelvis Impression: 1. No evidence of metastatic disease in the abdomen pelvis. 2. No skeletal metastasis Electronically Signed   By: SSuzy BouchardM.D.   On: 11/16/2019 12:09    ASSESSMENT AND PLAN: This is a very pleasant 54years old African-American female with metastatic non-small cell lung cancer, adenocarcinoma and positive for EGFR mutation with deletion in exon 19.  The patient is currently on treatment with GILOTRIF 40 mg p.o. daily status post 13 months.  The patient is tolerating this treatment well except for few episodes of diarrhea and mild skin  rash. She is complaining of dizzy spells and vertigo recently. Repeat CT scan of the chest, abdomen and pelvis performed recently showed some evidence for disease progression with increase in size and number of pulmonary nodules. Her treatment was switched to Tagrisso 80 mg p.o. daily started January 01, 2018.  She is status post 12 months of treatment She had repeat MRI of the brain as well as CT scan of the chest, abdomen pelvis performed. Her MRI of the brain showed no concerning findings for disease progression in the brain but CT scan of the chest showed some evidence for progression with lymphangitic spread of the disease. [facility] showed the emergence of BRAF mutation V600E.  I recommended for the patient to consider systemic chemotherapy and she is here today for evaluation and discussion of this option.  I had a lengthy discussion with the patient today regarding her condition.  She understands that a combination of BRAF inhibitor and Tagrisso has not been well studied at  this point. She is currently undergoing systemic chemotherapy with carboplatin for AUC of 5, Alimta 500 mg/M2 and Avastin 15 mg/KG every 3 weeks, status post 13 cycles.  Starting from cycle #7 she is on maintenance treatment with Alimta and Avastin every 3 weeks.   She also continues her current treatment with Tagrisso 80 mg p.o. daily. She has been tolerating the treatment well with no concerning issues except for fatigue and mild nausea. She had repeat MRI of the brain that unfortunately showed new small brain lesion. Her CT scan of the chest, abdomen pelvis showed clear evidence for disease progression with more than 50 new and enlarging pulmonary nodules. I personally and independently reviewed the scan images and discussed the results with the patient and her sister. Clearly her current treatment is not working anymore and I recommended for her to  discontinue her current systemic chemotherapy with maintenance Alimta and Avastin but she will continue with Tagrisso for now until we will start the next line of her treatment. I recommended for the patient to send blood sample to [facility] for molecular study and to see if the patient develop any new mutations. I will arrange for the patient to come back for follow-up visit in less than 2 weeks for evaluation and discussion of her treatment options based on the molecular markers. For the new brain metastasis, I will refer the patient to radiation oncology for consideration of SRS to the 2 tiny brain metastasis. For the hoarseness of her voice, she will continue her follow-up visit and evaluation by ENT. For the worsening back pain, I increase her fentanyl patch to 50 mcg/hour every 3 days. The patient was advised to call immediately if she has any other concerning symptoms in the interval. For the hoarseness of her voice, we will continue to monitor for now and consider the patient for referral to ENT if she has worsening of her symptoms. I will see her back for follow-up visit in 3 weeks for evaluation after repeating MRI of the brain as well as CT scan of the chest, abdomen pelvis for restaging of her disease. For pain management I will give her a refill of fentanyl patch today. She was advised to call immediately if she has any concerning symptoms in the interval. The patient voices understanding of current disease status and treatment options and is in agreement with the current care plan. All questions were answered. The patient knows to call the clinic with any problems, questions or concerns. We can certainly see the patient much sooner if necessary.  Disclaimer: This note was dictated with voice recognition software. Similar sounding words can inadvertently be transcribed and may not be corrected upon review.

## 2019-11-19 ENCOUNTER — Encounter: Payer: Self-pay | Admitting: Internal Medicine

## 2019-11-21 ENCOUNTER — Other Ambulatory Visit: Payer: Self-pay | Admitting: Radiation Therapy

## 2019-11-21 ENCOUNTER — Inpatient Hospital Stay: Payer: Medicare Other

## 2019-11-21 ENCOUNTER — Telehealth: Payer: Self-pay | Admitting: Family Medicine

## 2019-11-21 ENCOUNTER — Encounter: Payer: Self-pay | Admitting: Family Medicine

## 2019-11-21 DIAGNOSIS — C7949 Secondary malignant neoplasm of other parts of nervous system: Secondary | ICD-10-CM

## 2019-11-21 DIAGNOSIS — C7931 Secondary malignant neoplasm of brain: Secondary | ICD-10-CM

## 2019-11-21 DIAGNOSIS — R499 Unspecified voice and resonance disorder: Secondary | ICD-10-CM

## 2019-11-21 NOTE — Progress Notes (Signed)
Orders placed to access port the day of brain MRI at GI.   Mont Dutton R.T.(R)(T) Radiation Special Procedures Navigator

## 2019-11-21 NOTE — Progress Notes (Signed)
Histology and Location of Primary Cancer:  Stage IV (T3, N2, M1 a) non-small cell lung cancer, poorly differentiated adenocarcinoma with extensive miliary distribution in the lungs bilaterally  Chest & Abdominal/Pelvis CT with Contrast 11/16/2019 IMPRESSION: --Chest Impression: 1. Multiple bilateral pulmonary metastasis increased significantly in size from comparison exam 08/22/2019. Approximately 50 nodules per lung. 2. No clear mediastinal adenopathy. 3. Small pericardial effusion. 4. Small LEFT effusion and basilar atelectasis --Abdomen / Pelvis Impression: 1. No evidence of metastatic disease in the abdomen pelvis. 2. No skeletal metastasis  Sites of Visceral and Bony Metastatic Disease:  MRI Brain  11/14/2019 IMPRESSION: Two new enhancing lesions, a 3 mm within the cerebellar vermis and a 4 mm subependymal in the left lateral ventricle, consistent with metastatic disease. No mass effect or edema.  Past/Anticipated chemotherapy by medical oncology, if any:  Under care of Dr. Curt Bears 11/21/2019 -CURRENT THERAPY:Systemic chemotherapy with carboplatin for AUC 5, Alimta 500 mg/M2 and Avastin 15 mg/KG every 3 weeks.  First dose February 10, 2018.  Status post 13 cycles.  Starting from cycle #7 she will be on maintenance treatment with Alimta and Avastin in addition to her treatment with Tagrisso. -Clearly her current treatment is not working anymore and I recommended for her to discontinue her current systemic chemotherapy with maintenance Alimta and Avastin but she will continue with Tagrisso for now until we will start the next line of her treatment. -I recommended for the patient to send blood sample to [facility] for molecular study and to see if the patient develop any new mutations. -I will arrange for the patient to come back for follow-up visit in less than 2 weeks for evaluation and discussion of her treatment options based on the molecular markers. -For the new brain  metastasis, I will refer the patient to radiation oncology for consideration of SRS to the 2 tiny brain metastasis. -For the worsening back pain, I increase her fentanyl patch to 50 mcg/hour every 3 days. -For the hoarseness of her voice, we will continue to monitor for now and consider the patient for referral to ENT if she has worsening of her symptoms. -I will see her back for follow-up visit in 3 weeks for evaluation after repeating MRI of the brain as well as CT scan of the chest, abdomen pelvis for restaging of her disease. -She was advised to call immediately if she has any concerning symptoms in the interval.  Pain on a scale of 0-10 is: 8 out of 10 to both chest and back  Dose of Decadron, if applicable: Not currently. Was taking 37m PO the day before chemo, the day of chemo, and the day after chemo (but treatment is on hold currently)  Recent neurologic symptoms, if any:   Seizures: patient denies  Headaches: patient denies  Nausea: occasionally; "comes and goes"  Dizziness/ataxia: patient denies  Difficulty with hand coordination: patient denies  Focal numbness/weakness: patient denies  Visual deficits/changes: patient denies  Confusion/Memory deficits: patient denies  Bowel/Bladder retention or incontinence (please describe): patient denies  Ambulatory status? Walker? Wheelchair?: Patient is able to ambulate unassisted   SAFETY ISSUES:  Prior radiation? No  Pacemaker/ICD? No  Possible current pregnancy? No-partial abdominal hysterectomy  Is the patient on methotrexate? No  Current Complaints / other details:  Nothing of note

## 2019-11-21 NOTE — Telephone Encounter (Signed)
-----   Message from Curt Bears, MD sent at 11/19/2019  2:02 PM EDT ----- Yes. It may be a good idea to check her CT of the neck. Thank you. ----- Message ----- From: Darreld Mclean, MD Sent: 11/17/2019   7:13 PM EDT To: Curt Bears, MD  [place] saw Devlynn today and wanted to run something by you.  She let me know that unfortunately her cancer is progressing.  She continues to note a hoarse voice and a feeling of something in her throat, she is concerned about a mass.  I was thinking about ordering a CT soft tissue neck unless you would have a different recommendation-thank you so much! Jess

## 2019-11-22 ENCOUNTER — Encounter: Payer: Self-pay | Admitting: *Deleted

## 2019-11-22 ENCOUNTER — Ambulatory Visit
Admission: RE | Admit: 2019-11-22 | Discharge: 2019-11-22 | Disposition: A | Discharge status: Home / Self Care | Payer: Medicare Other | Source: Ambulatory Visit | Attending: Radiation Oncology | Admitting: Radiation Oncology

## 2019-11-22 ENCOUNTER — Encounter: Payer: Self-pay | Admitting: Radiation Oncology

## 2019-11-22 ENCOUNTER — Other Ambulatory Visit: Payer: Self-pay

## 2019-11-22 VITALS — BP 135/87 | HR 81 | Temp 97.2°F | Resp 18 | Ht 66.0 in | Wt 190.0 lb

## 2019-11-22 DIAGNOSIS — C7931 Secondary malignant neoplasm of brain: Secondary | ICD-10-CM | POA: Diagnosis not present

## 2019-11-22 DIAGNOSIS — J90 Pleural effusion, not elsewhere classified: Secondary | ICD-10-CM | POA: Insufficient documentation

## 2019-11-22 DIAGNOSIS — K219 Gastro-esophageal reflux disease without esophagitis: Secondary | ICD-10-CM | POA: Insufficient documentation

## 2019-11-22 DIAGNOSIS — C3490 Malignant neoplasm of unspecified part of unspecified bronchus or lung: Secondary | ICD-10-CM

## 2019-11-22 DIAGNOSIS — K449 Diaphragmatic hernia without obstruction or gangrene: Secondary | ICD-10-CM | POA: Insufficient documentation

## 2019-11-22 DIAGNOSIS — M129 Arthropathy, unspecified: Secondary | ICD-10-CM | POA: Diagnosis not present

## 2019-11-22 DIAGNOSIS — Z9221 Personal history of antineoplastic chemotherapy: Secondary | ICD-10-CM | POA: Insufficient documentation

## 2019-11-22 DIAGNOSIS — R590 Localized enlarged lymph nodes: Secondary | ICD-10-CM | POA: Diagnosis not present

## 2019-11-22 DIAGNOSIS — C3432 Malignant neoplasm of lower lobe, left bronchus or lung: Secondary | ICD-10-CM | POA: Insufficient documentation

## 2019-11-22 DIAGNOSIS — I313 Pericardial effusion (noninflammatory): Secondary | ICD-10-CM | POA: Diagnosis not present

## 2019-11-22 DIAGNOSIS — D649 Anemia, unspecified: Secondary | ICD-10-CM | POA: Insufficient documentation

## 2019-11-22 DIAGNOSIS — K5090 Crohn's disease, unspecified, without complications: Secondary | ICD-10-CM | POA: Insufficient documentation

## 2019-11-22 DIAGNOSIS — I10 Essential (primary) hypertension: Secondary | ICD-10-CM | POA: Insufficient documentation

## 2019-11-22 DIAGNOSIS — Z808 Family history of malignant neoplasm of other organs or systems: Secondary | ICD-10-CM | POA: Diagnosis not present

## 2019-11-22 MED FILL — fentaNYL 50 MCG/HR PT72: 50 | 30 days supply | Qty: 10 | Fill #0

## 2019-11-22 NOTE — Progress Notes (Signed)
Radiation Oncology         [phone] ________________________________  Initial Outpatient Consultation  Name: Nancy Moore MRN: [id number]  Date: 11/22/2019  DOB: 04/15/1965  CC:Copland, Jessica C, MD  Mohamed, Mohamed, MD   REFERRING PHYSICIAN: Mohamed, Mohamed, MD  DIAGNOSIS:    ICD-10-CM   1. Metastasis to brain (HCC)  C79.31 Ambulatory referral to Social Work  2. Primary malignant neoplasm of lung with metastasis to brain (HCC)  C34.90 Ambulatory referral to Social Work   C79.31   3. Brain metastases (HCC)  C79.31    STAGE IV LUNG CANCER NSCLC  CHIEF COMPLAINT: Here to discuss management of lung cancer with metastatic disease to the brain  HISTORY OF PRESENT ILLNESS::Nancy Moore is a 54 y.o. female who presented to Urgent Care with complaint of dry cough that had no improvement after using OTC cough medication as well as prescription Tussionex. Chest x-ray was performed on 10/08/2016 that showed a widespread opacity favoring miliary pattern. A follow-up chest CT scan was performed on the date of 10/10/2016 and revealed numerous bilateral sub-centimeter pulmonary nodules with a focal area of more prominent nodules over the superior segment of the left lower lobe with the largest two nodules measuring 1.7 x 3.0 cm and 1.9 x 2.4 cm. There was also minimal nodal prominence over the left neck base with the largest node measuring 1.1 cm by short axis. Finally, there was a 1.0 cm right peritracheal lymph node as well as a few other shotty mediastinal nodes present, but there was no hilar adenopathy nor was there any axillary lymphadenopathy.  Given the above findings, the patient underwent a bronchoscopy with bronchoalveolar lavage of the left lower lobe as well as transbronchial biopsy of the left lower lobe on 10/21/2016. Final pathology was consistent with poorly differentiated adenocarcinoma.   PET scan on the date of 10/31/2016 showed hypermetabolic miliary nodules throughout the  chest with confluent hypermetabolic airspace opacities in the superior segments of both lower lowers, left greater than right, and worsened when compared to prior chest CT scan. There was noted to be a new small left pleural effusion, pleural thickening in the paraspinal regions, and enlarging pericardial effusion. Mildly hypermetabolic left supraclavicular node and mildly enlarged, but low-grade, metabolic activity in the mediastinal lymph nodes was stable. Finally, there was a hypermetabolic lesion in the left T3 vertebral body and pedicle. Differentials included miliary infection (TB or fungal disease) versus miliary sarcoidosis versus Langerhans cell histiocytosis versus miliary metastatic disease with occult primary.   Initial brain MRI on 11/12/2016 did not show any evidence of metastatic disease.  The patient underwent 13 months of treatment with Gilotrif under the care of Dr. Mohamed. She tolerated treatment relatively well with the exception of diarrhea and a mild skin rash. Following treatment, she underwent staging CT scans of the chest, abdomen, and pelvis on 12/25/2017 that showed an interval increase in the size and number of pulmonary nodularity that was concerning for disease progression. The patient began treatment with Tagrisso on 01/01/2018.    MRI of brain on 01/05/2018 showed interval development of innumerable small brain metastases with a miliary appearance. Repeat brain MRI on 03/02/2018 showed a favorable response to therapy. Innumerable sub-centimeter brain metastases had all responded and may of them no longer showed T2 signal or contrast enhancement. Susceptibility weighted imaging showed foci of hemosiderin deposition at the location of most of the treated lesions. A few of the lesions continued to show minimal contrast enhancement, 3 mm or   less, particularly towards the vertex. There were no new or enlarging lesions, no mass effect, and no edema.  Restaging CT scan of chest,  abdomen, and pelvis on 03/08/2018 showed considerable improvement with resolution of many of the pulmonary nodules and reduction in size/conspicuity of many of the rest of the pulmonary nodules. There were continued hazy ground-glass central densities in the lungs with faint nodularity, a component of lymphangitic spread of tumor was no excluded in those areas. There were no findings of metastatic involvement in the abdomen or pelvis. Finally, there was a subtle sclerotic lesion posteriorly in the left T3 vertebral body that was likely residuum from prior metastatic lesion.  She underwent 12 months of treatment with Tagrisso and repeat imaging under the care of Dr. Mohamed. Unfortunately, chest CT scan on 12/31/2018 showed progression of metastatic disease in the chest with worsening findings of lymphangitic carcinomatosis throughout the left greater than right lungs. It also showed enlarging miliary and larger irregular pulmonary metastases and enlarging left paratracheal nodal metastasis. Brain MRI did not show any concerning findings for disease progression.  The patient was referred to Dr. Stinchcomb at [facility], who recommended systemic chemotherapy rather than a combination of BRAF inhibitor and Tagrisso. Given that, she was started on systemic chemotherapy with Carboplatin, Alimta, and Avastin on 01/15/20201. She is status post 13 cycles and has tolerated treatment relatively well with the exception of intermittent nausea, diarrhea, and fatigue.  Most recent brain MRI on 11/14/2019 showed two new enhancing lesions, a 3 mm within the cerebellar vermis and a 4 mm subependymal in the left lateral ventricle, consistent with metastatic disease. There was no mass effect or edema. Most recent CT scans of chest, abdomen, and pelvis on 11/16/2019 showed multiple bilateral pulmonary metastasis that had increased significantly from prior comparison exam in July. There were approximately 50  nodules per lung. Additionally, there was a small pericardial effusion, a small left effusion, and basilar atelectasis. There was no clear mediastinal adenopathy.  The patient has been reviewed at CNS tumor board conference.  The consensus was to proceed with 3 Tesla MRI and targeted stereotactic radiosurgery to the active lesions in her brain.   Recent neurologic symptoms, if any:   Seizures: patient denies  Headaches: patient denies  Nausea: occasionally; "comes and goes"  Dizziness/ataxia: patient denies  Difficulty with hand coordination: patient denies  Focal numbness/weakness: patient denies  Visual deficits/changes: patient denies  Confusion/Memory deficits: patient denies  Bowel/Bladder retention or incontinence (please describe): patient denies  Ambulatory status? Walker? Wheelchair?: Patient is able to ambulate unassisted   SAFETY ISSUES:  Prior radiation? No  Pacemaker/ICD? No  Possible current pregnancy? No-partial abdominal hysterectomy  Is the patient on methotrexate? No   PREVIOUS RADIATION THERAPY: No  PAST MEDICAL HISTORY:  has a past medical history of Adenocarcinoma of left lung, stage 4 (HCC) (10/28/2016), Anemia, Arthritis, Constipation, Crohn's disease (HCC), Dyspnea, GERD (gastroesophageal reflux disease), Goals of care, counseling/discussion (10/28/2016), History of hiatal hernia, Hypertension, Pneumonia, and Recurrent pleural effusion on left.    PAST SURGICAL HISTORY: Past Surgical History:  Procedure Laterality Date  . ABDOMINAL HYSTERECTOMY     partial hysterectomy  . BREAST EXCISIONAL BIOPSY Left 20+ yrs ago   benign  . CHEST TUBE INSERTION Left 10/26/2018   Procedure: INSERTION PLEURAL DRAINAGE CATHETER;  Surgeon: Van Trigt, Peter, MD;  Location: [facility] OR;  Service: Thoracic;  Laterality: Left;  . COLONOSCOPY    . IR IMAGING GUIDED PORT INSERTION  07/04/2019  .   PERICARDIAL WINDOW N/A 01/10/2017   Procedure: PERICARDIAL WINDOW- SUB XYPHOID,  RIGHT CHEST TUBE;  Surgeon: Van Trigt, Peter, MD;  Location: [facility] OR;  Service: Thoracic;  Laterality: N/A;  . PORT-A-CATH REMOVAL Left 05/10/2019   Procedure: REMOVAL PORT-A-CATH;  Surgeon: Van Trigt, Peter, MD;  Location: [facility] OR;  Service: Thoracic;  Laterality: Left;  . PORTACATH PLACEMENT Left 02/21/2019   Procedure: INSERTION PORT-A-CATH;  Surgeon: Van Trigt, Peter, MD;  Location: [facility] OR;  Service: Thoracic;  Laterality: Left;  . REMOVAL OF PLEURAL DRAINAGE CATHETER Left 02/21/2019   Procedure: REMOVAL OF PLEURAL DRAINAGE CATHETER;  Surgeon: Van Trigt, Peter, MD;  Location: [facility] OR;  Service: Thoracic;  Laterality: Left;  . VIDEO BRONCHOSCOPY Bilateral 10/21/2016   Procedure: VIDEO BRONCHOSCOPY WITH FLUORO;  Surgeon: Wert, Michael B, MD;  Location: [facility];  Service: Cardiopulmonary;  Laterality: Bilateral;    FAMILY HISTORY: family history includes Asthma in her mother; Brain cancer in her maternal uncle; Colon cancer in her paternal grandmother; Dementia in her father; Emphysema in her maternal grandmother; Heart attack in her father; Hyperlipidemia in her father; Hypertension in her father, maternal grandmother, and mother; Stroke in her mother.  SOCIAL HISTORY:  reports that she has never smoked. She has never used smokeless tobacco. She reports previous alcohol use. She reports that she does not use drugs.  ALLERGIES: Percocet [oxycodone-acetaminophen], Lisinopril, and Losartan  MEDICATIONS:  Current Outpatient Medications  Medication Sig Dispense Refill  . albuterol (VENTOLIN HFA) 108 (90 Base) MCG/ACT inhaler Inhale 2 puffs into the lungs every 6 (six) hours as needed for wheezing or shortness of breath. 8 g 1  . clindamycin (CLINDAGEL) 1 % gel Apply 1 application topically 2 (two) times daily. 30 g 1  . dexamethasone (DECADRON) 4 MG tablet 1 tablet p.o. twice daily the day before, day of and day after chemotherapy every 3 weeks 20 tablet 2  . doxycycline (VIBRA-TABS) 100 MG tablet Take  1 tablet (100 mg total) by mouth 2 (two) times daily. 20 tablet 0  . fentaNYL (DURAGESIC) 50 MCG/HR Place 1 patch onto the skin every 3 (three) days. 10 patch 0  . fluticasone (FLONASE) 50 MCG/ACT nasal spray Place 2 sprays into both nostrils daily as needed for allergies. 16 g 3  . folic acid (FOLVITE) 1 MG tablet TAKE 1 TABLET BY MOUTH ONCE A DAY 30 tablet 4  . furosemide (LASIX) 40 MG tablet Take 1 tablet (40 mg total) by mouth daily. 30 tablet 3  . guaiFENesin (MUCINEX) 600 MG 12 hr tablet Take 600 mg by mouth 2 (two) times daily as needed for to loosen phlegm.     . HYDROcodone-homatropine (HYCODAN) 5-1.5 MG/5ML syrup Take 5 mLs by mouth every 6 (six) hours as needed for cough. 240 mL 0  . lidocaine-prilocaine (EMLA) cream APPLY TO THE AFFECTED AREA(S) AS NEEDED 30 g 0  . linaclotide (LINZESS) 145 MCG CAPS capsule Take 1 capsule (145 mcg total) by mouth daily before breakfast. 30 capsule 6  . loperamide (IMODIUM) 1 MG/5ML solution Take 10 mLs (2 mg total) by mouth as needed for diarrhea or loose stools. (Patient taking differently: Take 2 mg by mouth daily as needed for diarrhea or loose stools. ) 120 mL 0  . loratadine (CLARITIN) 10 MG tablet Take 10 mg by mouth daily.    . magnesium oxide (MAG-OX) 400 (241.3 Mg) MG tablet Take 1 tablet (400 mg total) by mouth daily.    . methocarbamol (ROBAXIN) 500 MG tablet TAKE 1   TABLET (500 MG TOTAL) BY MOUTH EVERY 8 (EIGHT) HOURS AS NEEDED FOR MUSCLE SPASMS. 30 tablet 5  . nystatin cream (MYCOSTATIN) APPLY 1 APPLICATION TOPICALLY 2 (TWO) TIMES DAILY. (Patient taking differently: Apply 1 application topically daily as needed (skin irritaion). ) 30 g 0  . ondansetron (ZOFRAN) 8 MG tablet TAKE 1 TABLET BY MOUTH EVERY 8 HOURS AS NEEDED FOR NAUSEA OR VOMITING (Patient taking differently: Take 8 mg by mouth every 8 (eight) hours as needed for nausea or vomiting. ) 30 tablet 2  . oxyCODONE (OXY IR/ROXICODONE) 5 MG immediate release tablet TAKE 1 TABLET BY MOUTH  EVERY 6 HOURS AS NEEDED FOR PAIN 30 tablet 0  . pantoprazole (PROTONIX) 40 MG tablet Take 1 tablet (40 mg total) by mouth daily. 30 tablet 5  . potassium chloride SA (KLOR-CON) 20 MEQ tablet Take 1 tablet (20 mEq total) by mouth 2 (two) times daily. 60 tablet 5  . prochlorperazine (COMPAZINE) 10 MG tablet Take 1 tablet (10 mg total) by mouth every 6 (six) hours as needed for nausea or vomiting. 30 tablet 2  . senna-docusate (SENOKOT-S) 8.6-50 MG tablet Take 1 tablet by mouth at bedtime.    . TAGRISSO 80 MG tablet TAKE 1 TABLET (80 MG TOTAL) BY MOUTH DAILY. 30 tablet 2  . traMADol (ULTRAM) 50 MG tablet Take 1 tablet (50 mg total) by mouth every 6 (six) hours as needed. 40 tablet 0   No current facility-administered medications for this encounter.    REVIEW OF SYSTEMS:  Notable for that above.   PHYSICAL EXAM:  height is 5' 6" (1.676 m) and weight is 190 lb (86.2 kg). Her temporal temperature is 97.2 F (36.2 C) (abnormal). Her blood pressure is 135/87 and her pulse is 81. Her respiration is 18 and oxygen saturation is 99%.   General: Alert and oriented, in no acute distress  HEENT: Head is normocephalic. Extraocular movements are intact. Oropharynx is clear -no thrush Heart: Regular in rate and rhythm with no murmurs, rubs, or gallops. Chest: Clear to auscultation bilaterally, with no rhonchi, wheezes, or rales. Musculoskeletal: symmetric strength and muscle tone throughout. Neurologic: Cranial nerves II through XII are grossly intact. No obvious focalities. Speech is fluent. Coordination is intact. Psychiatric: Judgment and insight are intact. Affect is appropriate.   KPS = 80  100 - Normal; no complaints; no evidence of disease. 90   - Able to carry on normal activity; minor signs or symptoms of disease. 80   - Normal activity with effort; some signs or symptoms of disease. 70   - Cares for self; unable to carry on normal activity or to do active work. 60   - Requires occasional  assistance, but is able to care for most of his personal needs. 50   - Requires considerable assistance and frequent medical care. 40   - Disabled; requires special care and assistance. 30   - Severely disabled; hospital admission is indicated although death not imminent. 20   - Very sick; hospital admission necessary; active supportive treatment necessary. 10   - Moribund; fatal processes progressing rapidly. 0     - Dead  Karnofsky DA, Abelmann WH, Craver LS and Burchenal JH (1948) The use of the nitrogen mustards in the palliative treatment of carcinoma: with particular reference to bronchogenic carcinoma Cancer 1 634-56    LABORATORY DATA:  Lab Results  Component Value Date   WBC 7.2 11/17/2019   HGB 10.7 (L) 11/17/2019   HCT 34.0 (L) 11/17/2019     MCV 99.7 11/17/2019   PLT 346 11/17/2019   CMP     Component Value Date/Time   NA 140 11/17/2019 1139   NA 135 (L) 01/08/2017 1055   K 3.8 11/17/2019 1139   K 3.2 (L) 01/08/2017 1055   CL 103 11/17/2019 1139   CO2 26 11/17/2019 1139   CO2 25 01/08/2017 1055   GLUCOSE 141 (H) 11/17/2019 1139   GLUCOSE 109 01/08/2017 1055   BUN 13 11/17/2019 1139   BUN 14.6 01/08/2017 1055   CREATININE 1.21 (H) 11/17/2019 1139   CREATININE 1.3 (H) 01/08/2017 1055   CALCIUM 10.1 11/17/2019 1139   CALCIUM 9.5 01/08/2017 1055   PROT 7.7 11/17/2019 1139   PROT 7.1 01/08/2017 1055   ALBUMIN 3.2 (L) 11/17/2019 1139   ALBUMIN 3.6 01/08/2017 1055   AST 20 11/17/2019 1139   AST 31 01/08/2017 1055   ALT <6 11/17/2019 1139   ALT 34 01/08/2017 1055   ALKPHOS 87 11/17/2019 1139   ALKPHOS 82 01/08/2017 1055   BILITOT 0.4 11/17/2019 1139   BILITOT 1.74 (H) 01/08/2017 1055   GFRNONAA 54 (L) 11/17/2019 1139   GFRAA >60 10/26/2019 1200         RADIOGRAPHY: CT Chest W Contrast  Result Date: 11/16/2019 CLINICAL DATA:  Primary Cancer Type: Lung Imaging Indication: Routine surveillance Interval therapy since last imaging? Yes Initial Cancer Diagnosis  Date: 10/21/2016; established by: Biopsy-proven Detailed Pathology: Stage IV non-small cell lung cancer, poorly differentiated adenocarcinoma. Primary Tumor location: Extensive miliary distribution bilaterally. Metastases to brain. Surgeries: History of pleural catheters.  Hysterectomy. Chemotherapy: Yes; Ongoing? Yes; Most recent administration: 10/26/2019 Alimta and Avastin; Tagrisso daily. Immunotherapy? No Radiation therapy? No Other Cancer Therapies: Prior Gilotrif. EXAM: CT CHEST, ABDOMEN, AND PELVIS WITH CONTRAST TECHNIQUE: Multidetector CT imaging of the chest, abdomen and pelvis was performed following the standard protocol during bolus administration of intravenous contrast. CONTRAST:  100mL OMNIPAQUE IOHEXOL 300 MG/ML  SOLN COMPARISON:  Most recent CT chest, abdomen and pelvis 08/22/2019. 10/31/2016 PET-CT. FINDINGS: CT CHEST FINDINGS Cardiovascular: Port in the anterior chest wall with tip in distal SVC. Small pericardial effusion. Mediastinum/Nodes: No axillary supraclavicular adenopathy. No clear mediastinal adenopathy. Lungs/Pleura: Multiple round pulmonary nodules of varying size are increased comparison exam. Example: Nodule in the LEFT upper lobe along the pleural surface measuring 13 mm (image 57/series 6) increased from 4 mm. RIGHT lower lobe nodule medially measuring 13 mm (image 100) increased from 4 mm. Nodule in the azygoesophageal recess measuring 7 mm (image 106) increased from 2 mm. There approximately 50 nodules within the lung. There is focus consolidation in the medial LEFT lower lobe versus atelectasis and small effusion which is similar prior. Musculoskeletal: No aggressive osseous lesion. CT ABDOMEN AND PELVIS FINDINGS Hepatobiliary: No focal hepatic lesion. No biliary ductal dilatation. Gallbladder is normal. Common bile duct is normal. Pancreas: Pancreas normal. Spleen: Normal spleen Adrenals/urinary tract: Adrenal glands and kidneys are normal. The ureters and bladder normal.  Stomach/Bowel: Stomach, small bowel, appendix, and cecum are normal. The colon and rectosigmoid colon are normal. Vascular/Lymphatic: Abdominal aorta is normal caliber. There is no retroperitoneal or periportal lymphadenopathy. No pelvic lymphadenopathy. Reproductive: Post hysterectomy.  Adnexa unremarkable Other: Ino free-fluid.  No peritoneal metastasis. Musculoskeletal: No aggressive osseous lesion. IMPRESSION: Chest Impression: 1. Multiple bilateral pulmonary metastasis increased significantly in size from comparison exam 08/22/2019. Approximately 50 nodules per lung. 2. No clear mediastinal adenopathy. 3. Small pericardial effusion. 4. Small LEFT effusion and basilar atelectasis Abdomen / Pelvis Impression: 1. No   evidence of metastatic disease in the abdomen pelvis. 2. No skeletal metastasis Electronically Signed   By: Stewart  Edmunds M.D.   On: 11/16/2019 12:09   MR Brain W Wo Contrast  Result Date: 11/14/2019 CLINICAL DATA:  Non-small cell lung cancer, staging. EXAM: MRI HEAD WITHOUT AND WITH CONTRAST TECHNIQUE: Multiplanar, multiecho pulse sequences of the brain and surrounding structures were obtained without and with intravenous contrast. CONTRAST:  8mL GADAVIST GADOBUTROL 1 MMOL/ML IV SOLN COMPARISON:  MRI of the brain August 25, 2019. FINDINGS: Brain: No acute infarction, hemorrhage, hydrocephalus, or extra-axial collection. A 3 mm focus of faint contrast enhancement within the cerebellar vermis (series 16, image 9; series 17, image 12 and series 18, image 21) and a 4 mm subependymal focus of contrast enhancement in the left lateral ventricle (series 16, image 13; series 18, image 87). No mass effect or edema. Multiple scattered foci of susceptibility artifact are again seen, corresponding to posttreatment changes. Vascular: Normal flow voids. Skull and upper cervical spine: Normal marrow signal. Sinuses/Orbits: Mucosal thickening with mucous retention cysts within the right maxillary sinus. The  orbits are maintained Other: None. IMPRESSION: Two new enhancing lesions, a 3 mm within the cerebellar vermis and a 4 mm subependymal in the left lateral ventricle, consistent with metastatic disease. No mass effect or edema. These results will be called to the ordering clinician or representative by the Radiologist Assistant, and communication documented in the PACS or [facility] Dashboard. Electronically Signed   By: Katyucia  De Macedo Rodrigues M.D.   On: 11/14/2019 12:46   CT Abdomen Pelvis W Contrast  Result Date: 11/16/2019 CLINICAL DATA:  Primary Cancer Type: Lung Imaging Indication: Routine surveillance Interval therapy since last imaging? Yes Initial Cancer Diagnosis Date: 10/21/2016; established by: Biopsy-proven Detailed Pathology: Stage IV non-small cell lung cancer, poorly differentiated adenocarcinoma. Primary Tumor location: Extensive miliary distribution bilaterally. Metastases to brain. Surgeries: History of pleural catheters.  Hysterectomy. Chemotherapy: Yes; Ongoing? Yes; Most recent administration: 10/26/2019 Alimta and Avastin; Tagrisso daily. Immunotherapy? No Radiation therapy? No Other Cancer Therapies: Prior Gilotrif. EXAM: CT CHEST, ABDOMEN, AND PELVIS WITH CONTRAST TECHNIQUE: Multidetector CT imaging of the chest, abdomen and pelvis was performed following the standard protocol during bolus administration of intravenous contrast. CONTRAST:  100mL OMNIPAQUE IOHEXOL 300 MG/ML  SOLN COMPARISON:  Most recent CT chest, abdomen and pelvis 08/22/2019. 10/31/2016 PET-CT. FINDINGS: CT CHEST FINDINGS Cardiovascular: Port in the anterior chest wall with tip in distal SVC. Small pericardial effusion. Mediastinum/Nodes: No axillary supraclavicular adenopathy. No clear mediastinal adenopathy. Lungs/Pleura: Multiple round pulmonary nodules of varying size are increased comparison exam. Example: Nodule in the LEFT upper lobe along the pleural surface measuring 13 mm (image 57/series 6) increased from 4  mm. RIGHT lower lobe nodule medially measuring 13 mm (image 100) increased from 4 mm. Nodule in the azygoesophageal recess measuring 7 mm (image 106) increased from 2 mm. There approximately 50 nodules within the lung. There is focus consolidation in the medial LEFT lower lobe versus atelectasis and small effusion which is similar prior. Musculoskeletal: No aggressive osseous lesion. CT ABDOMEN AND PELVIS FINDINGS Hepatobiliary: No focal hepatic lesion. No biliary ductal dilatation. Gallbladder is normal. Common bile duct is normal. Pancreas: Pancreas normal. Spleen: Normal spleen Adrenals/urinary tract: Adrenal glands and kidneys are normal. The ureters and bladder normal. Stomach/Bowel: Stomach, small bowel, appendix, and cecum are normal. The colon and rectosigmoid colon are normal. Vascular/Lymphatic: Abdominal aorta is normal caliber. There is no retroperitoneal or periportal lymphadenopathy. No pelvic lymphadenopathy. Reproductive: Post hysterectomy.    Adnexa unremarkable Other: Ino free-fluid.  No peritoneal metastasis. Musculoskeletal: No aggressive osseous lesion. IMPRESSION: Chest Impression: 1. Multiple bilateral pulmonary metastasis increased significantly in size from comparison exam 08/22/2019. Approximately 50 nodules per lung. 2. No clear mediastinal adenopathy. 3. Small pericardial effusion. 4. Small LEFT effusion and basilar atelectasis Abdomen / Pelvis Impression: 1. No evidence of metastatic disease in the abdomen pelvis. 2. No skeletal metastasis Electronically Signed   By: Stewart  Edmunds M.D.   On: 11/16/2019 12:09      IMPRESSION/PLAN: Lung cancer with metastatic disease to the brain  I personally reviewed her imaging.  We discussed it in detail. We discussed the history of her brain metastases and acknowledged that initially, her metastatic disease in the brain was controlled by systemic therapy.  She had a remarkable resolution of her disease initially with systemic therapy but now she  has at least 2 nodules that have progressed.  We discussed the roles of whole brain radiation therapy versus stereotactic radiosurgery in addressing metastatic disease to the brain.  We discussed the varying side effects of each treatment technique.  My recommendation is that she undergo 3 Tesla MRI to restage her brain and then, as long as she has a limited number of brain metastases, pursue stereotactic radiosurgery to the known disease and keep a close eye on her brain with serial MRIs.  She understands a whole brain radiation care therapy can be considered in the future if she shows diffuse progression.  We discussed the risks benefits and side effects of stereotactic radiosurgery in detail.  She understands that sometimes patients have a low-grade headache after treatment and fatigue.  We discussed the rare risk of brain injury from treatment.  She is enthusiastic to proceed.  Consent form has been signed.  3 Tesla MRI has been scheduled and she will see us soon thereafter for treatment planning.  We discussed measures to reduce the risk of infection during the COVID-19 pandemic.  She has been vaccinated.  She will talk to Dr. Mohamed about the ideal timing for her booster shot.  On date of service, in total, I spent 50 minutes on this encounter. Patient was seen in person.   __________________________________________    , MD  This document serves as a record of services personally performed by  , MD. It was created on his behalf by Maleeha Khan, a trained medical scribe. The creation of this record is based on the scribe's personal observations and the provider's statements to them. This document has been checked and approved by the attending provider.  

## 2019-11-22 NOTE — Progress Notes (Signed)
[facility] Psychosocial Distress Screening Clinical Social Work  Clinical Social Work was referred by Lung navigator and distress screening protocol.  The patient scored a 9 on the Psychosocial Distress Thermometer which indicates severe distress. Clinical Social Worker attempted to contact patient by phone.  CSW left a message offering support and encouraging patient to return call.  Patient has worked with [facility] team in the past and is familiar with how to contact and access services.      ONCBCN DISTRESS SCREENING 11/22/2019  Screening Type Initial Screening  Distress experienced in past week (1-10) 9  Practical problem type Housing;Transportation  Emotional problem type Nervousness/Anxiety;Adjusting to illness  Physical Problem type Pain;Getting around;Breathing;Talking  Physician notified of physical symptoms Yes  Referral to clinical psychology No  Referral to clinical social work Yes  Referral to dietition No  Referral to financial advocate Yes  Referral to support programs Yes  Referral to palliative care No   Johnnye Lana, MSW, LCSW, OSW-C Clinical Social Worker [place] [phone]

## 2019-11-23 ENCOUNTER — Encounter: Payer: Self-pay | Admitting: Radiation Oncology

## 2019-11-23 DIAGNOSIS — C3492 Malignant neoplasm of unspecified part of left bronchus or lung: Secondary | ICD-10-CM | POA: Diagnosis not present

## 2019-11-28 ENCOUNTER — Telehealth: Payer: Self-pay | Admitting: Pharmacist

## 2019-11-28 ENCOUNTER — Telehealth: Payer: Self-pay

## 2019-11-28 NOTE — Telephone Encounter (Signed)
Oral Oncology Pharmacist Encounter   Prior Authorization for Nancy Moore has been denied.     Appeal letter sent to Part D Appeals and Grievance Department with supporting documentation. Appeal faxed to: [phone]    Oral Oncology Clinic will continue to follow.    Leron Croak, PharmD, BCPS Hematology/Oncology Clinical Pharmacist [name] [phone] 11/28/2019 3:13 PM

## 2019-11-28 NOTE — Telephone Encounter (Signed)
Oral Oncology Patient Advocate Encounter  Received notification from [place] that prior authorization for Nancy Moore is required.  PA submitted on CoverMyMeds Key [id number] Status is pending  Oral Oncology Clinic will continue to follow.  [place] Patient [place] Phone [phone] Fax [phone] 11/28/2019 9:29 AM

## 2019-11-30 ENCOUNTER — Ambulatory Visit
Admission: RE | Admit: 2019-11-30 | Discharge: 2019-11-30 | Disposition: A | Discharge status: Home / Self Care | Payer: Medicare Other | Source: Ambulatory Visit | Attending: Radiation Oncology | Admitting: Radiation Oncology

## 2019-11-30 ENCOUNTER — Telehealth: Payer: Self-pay | Admitting: Medical Oncology

## 2019-11-30 ENCOUNTER — Other Ambulatory Visit: Payer: Self-pay

## 2019-11-30 DIAGNOSIS — C3490 Malignant neoplasm of unspecified part of unspecified bronchus or lung: Secondary | ICD-10-CM | POA: Diagnosis not present

## 2019-11-30 DIAGNOSIS — C7949 Secondary malignant neoplasm of other parts of nervous system: Secondary | ICD-10-CM

## 2019-11-30 DIAGNOSIS — C7931 Secondary malignant neoplasm of brain: Secondary | ICD-10-CM | POA: Diagnosis not present

## 2019-11-30 MED ORDER — GADOBENATE DIMEGLUMINE 529 MG/ML IV SOLN
20.0000 mL | Freq: Once | INTRAVENOUS | Status: AC | PRN
  Administered 2019-11-30: 20 mL via INTRAVENOUS

## 2019-11-30 MED ORDER — SODIUM CHLORIDE 0.9% FLUSH: 10.0000 mL | INTRAVENOUS | Status: DC | PRN

## 2019-11-30 MED ORDER — HEPARIN SOD (PORK) LOCK FLUSH 100 UNIT/ML IV SOLN
500.0000 [IU] | Freq: Once | INTRAVENOUS | Status: AC
  Administered 2019-11-30: 500 [IU] via INTRAVENOUS

## 2019-11-30 NOTE — Telephone Encounter (Signed)
Leron Croak working on pt assistance for [employer]

## 2019-11-30 NOTE — Telephone Encounter (Signed)
Oral Oncology Pharmacist Encounter   Appeal for PA denial Lonie Peak was upheld by [employer].     Will proceed with applying for manufacturer assistance for Lynparza at this time.     Oral Oncology Clinic will continue to follow.    Leron Croak, PharmD, BCPS Hematology/Oncology Clinical Pharmacist [facility] [phone] 11/30/2019 8:13 AM

## 2019-12-01 ENCOUNTER — Inpatient Hospital Stay: Payer: Medicare Other | Attending: Oncology

## 2019-12-01 ENCOUNTER — Other Ambulatory Visit: Payer: Self-pay

## 2019-12-01 ENCOUNTER — Inpatient Hospital Stay: Payer: Medicare Other | Admitting: Internal Medicine

## 2019-12-01 ENCOUNTER — Inpatient Hospital Stay: Payer: Medicare Other

## 2019-12-01 ENCOUNTER — Telehealth: Payer: Self-pay

## 2019-12-01 ENCOUNTER — Encounter: Payer: Self-pay | Admitting: Internal Medicine

## 2019-12-01 VITALS — BP 131/78 | HR 79 | Temp 98.0°F | Resp 18 | Ht 66.0 in | Wt 184.8 lb

## 2019-12-01 DIAGNOSIS — K219 Gastro-esophageal reflux disease without esophagitis: Secondary | ICD-10-CM | POA: Diagnosis not present

## 2019-12-01 DIAGNOSIS — Z7189 Other specified counseling: Secondary | ICD-10-CM

## 2019-12-01 DIAGNOSIS — I10 Essential (primary) hypertension: Secondary | ICD-10-CM

## 2019-12-01 DIAGNOSIS — C3492 Malignant neoplasm of unspecified part of left bronchus or lung: Secondary | ICD-10-CM | POA: Diagnosis not present

## 2019-12-01 DIAGNOSIS — Z79899 Other long term (current) drug therapy: Secondary | ICD-10-CM | POA: Diagnosis not present

## 2019-12-01 DIAGNOSIS — R918 Other nonspecific abnormal finding of lung field: Secondary | ICD-10-CM | POA: Diagnosis not present

## 2019-12-01 DIAGNOSIS — Z5111 Encounter for antineoplastic chemotherapy: Secondary | ICD-10-CM

## 2019-12-01 DIAGNOSIS — C7931 Secondary malignant neoplasm of brain: Secondary | ICD-10-CM

## 2019-12-01 DIAGNOSIS — K5090 Crohn's disease, unspecified, without complications: Secondary | ICD-10-CM | POA: Diagnosis not present

## 2019-12-01 DIAGNOSIS — Z9221 Personal history of antineoplastic chemotherapy: Secondary | ICD-10-CM | POA: Insufficient documentation

## 2019-12-01 DIAGNOSIS — Z95828 Presence of other vascular implants and grafts: Secondary | ICD-10-CM

## 2019-12-01 DIAGNOSIS — C3490 Malignant neoplasm of unspecified part of unspecified bronchus or lung: Secondary | ICD-10-CM | POA: Diagnosis not present

## 2019-12-01 DIAGNOSIS — Z923 Personal history of irradiation: Secondary | ICD-10-CM | POA: Insufficient documentation

## 2019-12-01 LAB — CBC WITH DIFFERENTIAL (CANCER CENTER ONLY)
Abs Immature Granulocytes: 0.02 10*3/uL (ref 0.00–0.07)
Basophils Absolute: 0 10*3/uL (ref 0.0–0.1)
Basophils Relative: 0 %
Eosinophils Absolute: 0.2 10*3/uL (ref 0.0–0.5)
Eosinophils Relative: 2 %
HCT: 33.9 % — ABNORMAL LOW (ref 36.0–46.0)
Hemoglobin: 10.5 g/dL — ABNORMAL LOW (ref 12.0–15.0)
Immature Granulocytes: 0 %
Lymphocytes Relative: 18 %
Lymphs Abs: 1.6 10*3/uL (ref 0.7–4.0)
MCH: 30 pg (ref 26.0–34.0)
MCHC: 31 g/dL (ref 30.0–36.0)
MCV: 96.9 fL (ref 80.0–100.0)
Monocytes Absolute: 1.3 10*3/uL — ABNORMAL HIGH (ref 0.1–1.0)
Monocytes Relative: 14 %
Neutro Abs: 6 10*3/uL (ref 1.7–7.7)
Neutrophils Relative %: 66 %
Platelet Count: 206 10*3/uL (ref 150–400)
RBC: 3.5 MIL/uL — ABNORMAL LOW (ref 3.87–5.11)
RDW: 14.9 % (ref 11.5–15.5)
WBC Count: 9.1 10*3/uL (ref 4.0–10.5)
nRBC: 0 % (ref 0.0–0.2)

## 2019-12-01 LAB — CMP (CANCER CENTER ONLY)
ALT: 8 U/L (ref 0–44)
AST: 20 U/L (ref 15–41)
Albumin: 3.2 g/dL — ABNORMAL LOW (ref 3.5–5.0)
Alkaline Phosphatase: 77 U/L (ref 38–126)
Anion gap: 9 (ref 5–15)
BUN: 13 mg/dL (ref 6–20)
CO2: 29 mmol/L (ref 22–32)
Calcium: 9.7 mg/dL (ref 8.9–10.3)
Chloride: 104 mmol/L (ref 98–111)
Creatinine: 1.17 mg/dL — ABNORMAL HIGH (ref 0.44–1.00)
GFR, Estimated: 56 mL/min — ABNORMAL LOW (ref >60)
Glucose, Bld: 92 mg/dL (ref 70–99)
Potassium: 3.7 mmol/L (ref 3.5–5.1)
Sodium: 142 mmol/L (ref 135–145)
Total Bilirubin: 0.5 mg/dL (ref 0.3–1.2)
Total Protein: 7.3 g/dL (ref 6.5–8.1)

## 2019-12-01 LAB — TOTAL PROTEIN, URINE DIPSTICK: Protein, ur: NEGATIVE mg/dL

## 2019-12-01 MED ORDER — OLAPARIB 150 MG PO TABS: 300.0000 mg | ORAL_TABLET | Freq: Two times a day (BID) | ORAL | 2 refills | Status: DC

## 2019-12-01 MED ORDER — SODIUM CHLORIDE 0.9% FLUSH
10.0000 mL | INTRAVENOUS | Status: DC | PRN
  Administered 2019-12-01: 10 mL
  Filled 2019-12-01: qty 10

## 2019-12-01 MED ORDER — HEPARIN SOD (PORK) LOCK FLUSH 100 UNIT/ML IV SOLN
500.0000 [IU] | Freq: Once | INTRAVENOUS | Status: AC | PRN
  Administered 2019-12-01: 500 [IU]
  Filled 2019-12-01: qty 5

## 2019-12-01 MED ORDER — OLAPARIB 150 MG PO TABS
300.0000 mg | ORAL_TABLET | Freq: Two times a day (BID) | ORAL | 2 refills | Status: DC
Start: 2019-12-01 — End: 2020-02-18

## 2019-12-01 NOTE — Telephone Encounter (Signed)
Oral Oncology Patient Advocate Encounter  Met patient in [place] to complete application for [place] in an effort to reduce the patient's out of pocket expense for Lynparza to $0.    Application completed and faxed to [phone].   [place] patient assistance phone number for follow up is [phone].   This encounter will be updated until final determination.  [place] Patient [place] Phone [phone] Fax [phone] 12/01/2019 10:51 AM

## 2019-12-01 NOTE — Telephone Encounter (Signed)
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  Prescription dose and frequency assessed for appropriateness. Appropriate for therapy initiation.   CBC w/ Diff and CMP from 12/01/19 assessed, noted patient with SCr of 1.17 mg/dL (CrCl ~56 mL/min). Current dose OK for based on current CrCl, if patient's CrCl decreases to < 50 mL/min then patient's dose of Lynparza will need to be dose reduced at that time.   Current medication list in Epic reviewed, no relevant/significant [place].  Evaluated chart and no patient barriers to medication adherence noted.   Pres[facility] has been [facility] the [facility] denied for LLonie Peak and appeal for overturning LLonie Peakdenial was also upheld. Patient's signature obtained for applying for manufacturer assistance through [employer]and[employer].   Oral Oncology Clinic will continue to follow for manufacturer assistance approval and start date.  RLeron Croak PharmD, BCPS Hematology/Oncology Clinical Pharmacist [phone] 249911/04/2019 10:03 AM

## 2019-12-01 NOTE — Telephone Encounter (Signed)
Oral Chemotherapy Pharmacist Encounter  I spoke with patient today for overview of: Lynparza (olaparib) for the treatment of stage IV non-small cell lung cancer, positive for ATM Q2279f mutation and EGFR mutation (exon 19 deletion), planned duration until disease progression or unacceptable toxicity.   Counseled patient on administration, dosing, side effects, monitoring, drug-food interactions, safe handling, storage, and disposal.  Patient will take Lynparza 1582mtablets, 2 tablets (30089mby mouth 2 times daily without regard to food.  Patient counseled that administering with food may increase tolerability.  Patient knows to avoid grapefruit or grapefruit juice while on therapy with Lynparza.  LynLonie Peakart date: pending based on manufacturer approval through [place] - encounter will be updated once patient has started medication     Adverse effects include but are not limited to: nausea, vomiting, diarrhea, taste changes, mouth sores, fatigue, constipation, decreased blood counts, and joint pain. Patient informed that pneumonitis (including some fatalities) has occurred rarely. Myelodysplastic syndrome/acute myeloid leukemia (MDS/AML) have been reported (rarely) in clinical trials.  Patient has anti-emetic on hand and knows to take it if nausea develops.   Patient will obtain anti diarrheal and alert the office of 4 or more loose stools above baseline.  Reviewed with patient importance of keeping a medication schedule and plan for any missed doses. No barriers to medication adherence identified.  Medication reconciliation performed and medication/allergy list updated.  Insurance authorization for LynLonie Peaks been denied, and appeal for denial was upheld. Manufacturer assistance for LynM[employer] Holdingsplied for through AZ&Me, approval is currently pending.   All questions answered.  Ms. BesHolloniced understanding and appreciation.   Medication education handout given to patient in clinic.  Patient knows to call the office with questions or concerns. Oral Chemotherapy Clinic phone number provided to patient.   RebLeron CroakharmD, BCPS Hematology/Oncology Clinical Pharmacist [phone]/04/2019 10:13 AM

## 2019-12-01 NOTE — Patient Instructions (Signed)

## 2019-12-01 NOTE — Progress Notes (Signed)
[place] Telephone:([phone]) [phone]   Fax:(336) [phone]  OFFICE PROGRESS NOTE  Copland, Gay Filler, MD [address] [place] [address]  DIAGNOSIS: DIAGNOSIS: stage IV (T3, N2, M1 a) non-small cell lung cancer, poorly differentiated adenocarcinoma with extensive miliary distribution in the lungs bilaterally diagnosed in September 2018. 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 NEGATIVE for the Exon 20 T790M mutation. Repeat molecular studies by guardant 360 at time of progression showed ATM [id number] [facility], V600E  PRIOR THERAPY:  1) Gilotrif 40 mg daily started 11/08/2016.Status post 13 months of treatment. 2) Tagrisso 80 mg p.o. daily.  First dose started January 01, 2018.  Status post 18 months of treatment. 3) Systemic chemotherapy with carboplatin for AUC 5, Alimta 500 mg/M2 and Avastin 15 mg/KG every 3 weeks.  First dose February 10, 2018.  Status post 13 cycles.  Starting from cycle #7 she will be on maintenance treatment with Alimta and Avastin in addition to her treatment with Tagrisso.  This was discontinued for second disease progression.  CURRENT THERAPY:  Olaparib 300 mg p.o. twice daily.  Expected to start in the next few days.  INTERVAL HISTORY: [name] 54y.o. female returns to the clinic today for follow-up visit accompanied by her sister.  The patient is feeling fine today with no concerning complaints except for fatigue and shortness of breath with exertion.  She was seen by Dr. SIsidore Moosand expected to have SAlbert Leato the metastatic brain lesion soon.  She denied having any chest pain, cough or hemoptysis.  She denied having any fever or chills.  She has no nausea, vomiting, diarrhea or constipation.  She denied having any headache or visual changes.  Her molecular studies by [employer] 360 showed persistent of the EGFR  mutation with exon 19 deletion but there was 15.4% ATM [id number] mutation which can be targetable with olaparib.  The patient is here today for evaluation and discussion of her treatment options.  MEDICAL HISTORY: Past Medical History:  Diagnosis Date  . Adenocarcinoma of left lung, stage 4 ([facility] 10/28/2016  . Anemia   . Arthritis   . Constipation   . Crohn's disease ([facility]    in remission 35 years +  . Dyspnea    gets "winded"  . GERD (gastroesophageal reflux disease)   . Goals of care, counseling/discussion 10/28/2016  . History of hiatal hernia    noted on CT 12/20  . Hypertension   . Pneumonia    walking pneumonia in the late 80's  . Recurrent pleural effusion on left     ALLERGIES:  is allergic to percocet [oxycodone-acetaminophen], lisinopril, and losartan.  MEDICATIONS:  Current Outpatient Medications  Medication Sig Dispense Refill  . albuterol (VENTOLIN HFA) 108 (90 Base) MCG/ACT inhaler Inhale 2 puffs into the lungs every 6 (six) hours as needed for wheezing or shortness of breath. 8 g 1  . clindamycin (CLINDAGEL) 1 % gel Apply 1 application topically 2 (two) times daily. 30 g 1  . dexamethasone (DECADRON) 4 MG tablet 1 tablet p.o. twice daily the day before, day of and day after chemotherapy every 3 weeks 20 tablet 2  . doxycycline (VIBRA-TABS) 100 MG tablet Take 1 tablet (100 mg total) by mouth 2 (two) times daily. 20 tablet 0  . fentaNYL (DURAGESIC) 50 MCG/HR Place 1 patch onto the skin every 3 (three)  days. 10 patch 0  . fluticasone (FLONASE) 50 MCG/ACT nasal spray Place 2 sprays into both nostrils daily as needed for allergies. 16 g 3  . folic acid (FOLVITE) 1 MG tablet TAKE 1 TABLET BY MOUTH ONCE A DAY 30 tablet 4  . furosemide (LASIX) 40 MG tablet Take 1 tablet (40 mg total) by mouth daily. 30 tablet 3  . guaiFENesin (MUCINEX) 600 MG 12 hr tablet Take 600 mg by mouth 2 (two) times daily as needed for to loosen phlegm.     Marland Kitchen HYDROcodone-homatropine (HYCODAN) 5-1.5  MG/5ML syrup Take 5 mLs by mouth every 6 (six) hours as needed for cough. 240 mL 0  . lidocaine-prilocaine (EMLA) cream APPLY TO THE AFFECTED AREA(S) AS NEEDED 30 g 0  . linaclotide (LINZESS) 145 MCG CAPS capsule Take 1 capsule (145 mcg total) by mouth daily before breakfast. 30 capsule 6  . loperamide (IMODIUM) 1 MG/5ML solution Take 10 mLs (2 mg total) by mouth as needed for diarrhea or loose stools. (Patient taking differently: Take 2 mg by mouth daily as needed for diarrhea or loose stools. ) 120 mL 0  . loratadine (CLARITIN) 10 MG tablet Take 10 mg by mouth daily.    . magnesium oxide (MAG-OX) 400 (241.3 Mg) MG tablet Take 1 tablet (400 mg total) by mouth daily.    . methocarbamol (ROBAXIN) 500 MG tablet TAKE 1 TABLET (500 MG TOTAL) BY MOUTH EVERY 8 (EIGHT) HOURS AS NEEDED FOR MUSCLE SPASMS. 30 tablet 5  . nystatin cream (MYCOSTATIN) APPLY 1 APPLICATION TOPICALLY 2 (TWO) TIMES DAILY. (Patient taking differently: Apply 1 application topically daily as needed (skin irritaion). ) 30 g 0  . ondansetron (ZOFRAN) 8 MG tablet TAKE 1 TABLET BY MOUTH EVERY 8 HOURS AS NEEDED FOR NAUSEA OR VOMITING (Patient taking differently: Take 8 mg by mouth every 8 (eight) hours as needed for nausea or vomiting. ) 30 tablet 2  . oxyCODONE (OXY IR/ROXICODONE) 5 MG immediate release tablet TAKE 1 TABLET BY MOUTH EVERY 6 HOURS AS NEEDED FOR PAIN 30 tablet 0  . pantoprazole (PROTONIX) 40 MG tablet Take 1 tablet (40 mg total) by mouth daily. 30 tablet 5  . potassium chloride SA (KLOR-CON) 20 MEQ tablet Take 1 tablet (20 mEq total) by mouth 2 (two) times daily. 60 tablet 5  . prochlorperazine (COMPAZINE) 10 MG tablet Take 1 tablet (10 mg total) by mouth every 6 (six) hours as needed for nausea or vomiting. 30 tablet 2  . senna-docusate (SENOKOT-S) 8.6-50 MG tablet Take 1 tablet by mouth at bedtime.    Marland Kitchen TAGRISSO 80 MG tablet TAKE 1 TABLET (80 MG TOTAL) BY MOUTH DAILY. 30 tablet 2  . traMADol (ULTRAM) 50 MG tablet Take 1  tablet (50 mg total) by mouth every 6 (six) hours as needed. 40 tablet 0   No current facility-administered medications for this visit.   Facility-Administered Medications Ordered in Other Visits  Medication Dose Route Frequency Provider Last Rate Last Admin  . sodium chloride flush (NS) 0.9 % injection 10 mL  10 mL Intracatheter PRN Curt Bears, MD   10 mL at 12/01/19 6389    SURGICAL HISTORY:  Past Surgical History:  Procedure Laterality Date  . ABDOMINAL HYSTERECTOMY     partial hysterectomy  . BREAST EXCISIONAL BIOPSY Left 20+ yrs ago   benign  . CHEST TUBE INSERTION Left 10/26/2018   Procedure: INSERTION PLEURAL DRAINAGE CATHETER;  Surgeon: Ivin Poot, MD;  Location: [facility];  Service: Thoracic;  Laterality: Left;  . COLONOSCOPY    . IR IMAGING GUIDED PORT INSERTION  07/04/2019  . PERICARDIAL WINDOW N/A 01/10/2017   Procedure: PERICARDIAL WINDOW- SUB XYPHOID, RIGHT CHEST TUBE;  Surgeon: Ivin Poot, MD;  Location: [facility];  Service: Thoracic;  Laterality: N/A;  . PORT-A-CATH REMOVAL Left 05/10/2019   Procedure: REMOVAL PORT-A-CATH;  Surgeon: Ivin Poot, MD;  Location: [facility];  Service: Thoracic;  Laterality: Left;  . PORTACATH PLACEMENT Left 02/21/2019   Procedure: INSERTION PORT-A-CATH;  Surgeon: Ivin Poot, MD;  Location: [facility];  Service: Thoracic;  Laterality: Left;  . REMOVAL OF PLEURAL DRAINAGE CATHETER Left 02/21/2019   Procedure: REMOVAL OF PLEURAL DRAINAGE CATHETER;  Surgeon: Ivin Poot, MD;  Location: [facility];  Service: Thoracic;  Laterality: Left;  Marland Kitchen VIDEO BRONCHOSCOPY Bilateral 10/21/2016   Procedure: VIDEO BRONCHOSCOPY WITH FLUORO;  Surgeon: Tanda Rockers, MD;  Location: [facility];  Service: Cardiopulmonary;  Laterality: Bilateral;    REVIEW OF SYSTEMS:  Constitutional: positive for fatigue Eyes: negative Ears, nose, mouth, throat, and face: negative Respiratory: positive for cough and dyspnea on exertion Cardiovascular:  negative Gastrointestinal: negative Genitourinary:negative Integument/breast: negative Hematologic/lymphatic: negative Musculoskeletal:positive for back pain Neurological: negative Behavioral/Psych: negative Endocrine: negative Allergic/Immunologic: negative   PHYSICAL EXAMINATION: General appearance: alert, cooperative, fatigued and no distress Head: Normocephalic, without obvious abnormality, atraumatic Neck: no adenopathy, no JVD, supple, symmetrical, trachea midline and thyroid not enlarged, symmetric, no tenderness/mass/nodules Lymph nodes: Cervical, supraclavicular, and axillary nodes normal. Resp: clear to auscultation bilaterally Back: symmetric, no curvature. ROM normal. No CVA tenderness. Cardio: regular rate and rhythm, S1, S2 normal, no murmur, click, rub or gallop GI: soft, non-tender; bowel sounds normal; no masses,  no organomegaly Extremities: extremities normal, atraumatic, no cyanosis or edema Neurologic: Alert and oriented X 3, normal strength and tone. Normal symmetric reflexes. Normal coordination and gait  ECOG PERFORMANCE STATUS: 1 - Symptomatic but completely ambulatory  Blood pressure 131/78, pulse 79, temperature 98 F (36.7 C), temperature source Tympanic, resp. rate 18, height 5' 6"  (1.676 m), weight 184 lb 12.8 oz (83.8 kg), last menstrual period 03/15/2010, SpO2 99 %.  LABORATORY DATA: Lab Results  Component Value Date   WBC 9.1 12/01/2019   HGB 10.5 (L) 12/01/2019   HCT 33.9 (L) 12/01/2019   MCV 96.9 12/01/2019   PLT 206 12/01/2019      Chemistry      Component Value Date/Time   NA 140 11/17/2019 1139   NA 135 (L) 01/08/2017 1055   K 3.8 11/17/2019 1139   K 3.2 (L) 01/08/2017 1055   CL 103 11/17/2019 1139   CO2 26 11/17/2019 1139   CO2 25 01/08/2017 1055   BUN 13 11/17/2019 1139   BUN 14.6 01/08/2017 1055   CREATININE 1.21 (H) 11/17/2019 1139   CREATININE 1.3 (H) 01/08/2017 1055      Component Value Date/Time   CALCIUM 10.1  11/17/2019 1139   CALCIUM 9.5 01/08/2017 1055   ALKPHOS 87 11/17/2019 1139   ALKPHOS 82 01/08/2017 1055   AST 20 11/17/2019 1139   AST 31 01/08/2017 1055   ALT <6 11/17/2019 1139   ALT 34 01/08/2017 1055   BILITOT 0.4 11/17/2019 1139   BILITOT 1.74 (H) 01/08/2017 1055       RADIOGRAPHIC STUDIES: CT Chest W Contrast  Result Date: 11/16/2019 CLINICAL DATA:  Primary Cancer Type: Lung Imaging Indication: Routine surveillance Interval therapy since last imaging? Yes Initial Cancer Diagnosis Date: 10/21/2016; established by: Biopsy-proven Detailed Pathology: Stage  IV non-small cell lung cancer, poorly differentiated adenocarcinoma. Primary Tumor location: Extensive miliary distribution bilaterally. Metastases to brain. Surgeries: History of pleural catheters.  Hysterectomy. Chemotherapy: Yes; Ongoing? Yes; Most recent administration: 10/26/2019 Alimta and Avastin; Tagrisso daily. Immunotherapy? No Radiation therapy? No Other Cancer Therapies: Prior Gilotrif. EXAM: CT CHEST, ABDOMEN, AND PELVIS WITH CONTRAST TECHNIQUE: Multidetector CT imaging of the chest, abdomen and pelvis was performed following the standard protocol during bolus administration of intravenous contrast. CONTRAST:  167m OMNIPAQUE IOHEXOL 300 MG/ML  SOLN COMPARISON:  Most recent CT chest, abdomen and pelvis 08/22/2019. 10/31/2016 PET-CT. FINDINGS: CT CHEST FINDINGS Cardiovascular: Port in the anterior chest wall with tip in distal SVC. Small pericardial effusion. Mediastinum/Nodes: No axillary supraclavicular adenopathy. No clear mediastinal adenopathy. Lungs/Pleura: Multiple round pulmonary nodules of varying size are increased comparison exam. Example: Nodule in the LEFT upper lobe along the pleural surface measuring 13 mm (image 57/series 6) increased from 4 mm. RIGHT lower lobe nodule medially measuring 13 mm (image 100) increased from 4 mm. Nodule in the azygoesophageal recess measuring 7 mm (image 106) increased from 2 mm. There  approximately 50 nodules within the lung. There is focus consolidation in the medial LEFT lower lobe versus atelectasis and small effusion which is similar prior. Musculoskeletal: No aggressive osseous lesion. CT ABDOMEN AND PELVIS FINDINGS Hepatobiliary: No focal hepatic lesion. No biliary ductal dilatation. Gallbladder is normal. Common bile duct is normal. Pancreas: Pancreas normal. Spleen: Normal spleen Adrenals/urinary tract: Adrenal glands and kidneys are normal. The ureters and bladder normal. Stomach/Bowel: Stomach, small bowel, appendix, and cecum are normal. The colon and rectosigmoid colon are normal. Vascular/Lymphatic: Abdominal aorta is normal caliber. There is no retroperitoneal or periportal lymphadenopathy. No pelvic lymphadenopathy. Reproductive: Post hysterectomy.  Adnexa unremarkable Other: Ino free-fluid.  No peritoneal metastasis. Musculoskeletal: No aggressive osseous lesion. IMPRESSION: Chest Impression: 1. Multiple bilateral pulmonary metastasis increased significantly in size from comparison exam 08/22/2019. Approximately 50 nodules per lung. 2. No clear mediastinal adenopathy. 3. Small pericardial effusion. 4. Small LEFT effusion and basilar atelectasis Abdomen / Pelvis Impression: 1. No evidence of metastatic disease in the abdomen pelvis. 2. No skeletal metastasis Electronically Signed   By: SSuzy BouchardM.D.   On: 11/16/2019 12:09   MR Brain W Wo Contrast  Addendum Date: 11/30/2019   ADDENDUM REPORT: 11/30/2019 15:15 ADDENDUM: For this exam the patient's Port-A-Cath was accessed by the on-site RN. Electronically Signed   By: HGenevie AnnM.D.   On: 11/30/2019 15:15   Result Date: 11/30/2019 CLINICAL DATA:  54year old female with non-small cell lung cancer history of innumerable brain metastases in late 2019 which responded to systemic therapy. Two small new enhancing brain lesions on MRI last month compatible with new metastases, and recent progression of systemic disease. SRS  treatment planning. EXAM: MRI HEAD WITHOUT AND WITH CONTRAST TECHNIQUE: Multiplanar, multiecho pulse sequences of the brain and surrounding structures were obtained without and with intravenous contrast. CONTRAST:  220mMULTIHANCE GADOBENATE DIMEGLUMINE 529 MG/ML IV SOLN COMPARISON:  Brain MRI 11/14/2019 and earlier. FINDINGS: Brain: On postcontrast images today 7 vs 8 small enhancing lesions of the cerebellum are identified (series 11 images 18 through 48 - with that in the right lateral cerebellum on series 11, image 24 possibly artifact.). These range from punctate to 5-6 mm in size (vermis and right superior cerebellum). Each of these lesions appears to be new from prior studies in July this year and earlier. The vermis lesion was most apparent last month with MPRAGE T1 post technique.  No significant edema or mass effect. No definite brainstem lesion. Supratentorially 2 small enhancing lesions are identified, including the 5-6 mm left lateral ventricle subependymal lesion seen last month (series 11, image 87) and a punctate lesion of the inferior right temporal lobe (image 52). Minimal edema with the former. No mass effect. Questionable additional punctate left inferior frontal gyrus lesion on series 11, image 70. No superimposed restricted diffusion suggestive of acute infarction. No midline shift, mass effect, ventriculomegaly, extra-axial collection or acute intracranial hemorrhage. Cervicomedullary junction and pituitary are within normal limits. No dural thickening identified. Vascular: Major intracranial vascular flow voids are stable. Skull and upper cervical spine: Negative visible cervical spine. Visualized bone marrow signal is within normal limits. Sinuses/Orbits: Stable, negative; small chronic right maxillary retention cysts. Other: Mastoids remain clear. Visible internal auditory structures appear normal. Scalp and face appear negative. IMPRESSION: At least nine and up to eleven small enhancing  brain metastases identified today, with up to eight of those in the cerebellum. They range from punctate to 6 mm diameter. The two questionable lesions are in the right lateral cerebellum (series 11, image 24) and the left inferior frontal gyrus (image 70). Minimal edema with no mass effect. Electronically Signed: By: Genevie Ann M.D. On: 11/30/2019 11:53   MR Brain W Wo Contrast  Result Date: 11/14/2019 CLINICAL DATA:  Non-small cell lung cancer, staging. EXAM: MRI HEAD WITHOUT AND WITH CONTRAST TECHNIQUE: Multiplanar, multiecho pulse sequences of the brain and surrounding structures were obtained without and with intravenous contrast. CONTRAST:  94m GADAVIST GADOBUTROL 1 MMOL/ML IV SOLN COMPARISON:  MRI of the brain August 25, 2019. FINDINGS: Brain: No acute infarction, hemorrhage, hydrocephalus, or extra-axial collection. A 3 mm focus of faint contrast enhancement within the cerebellar vermis (series 16, image 9; series 17, image 12 and series 18, image 21) and a 4 mm subependymal focus of contrast enhancement in the left lateral ventricle (series 16, image 13; series 18, image 87). No mass effect or edema. Multiple scattered foci of susceptibility artifact are again seen, corresponding to posttreatment changes. Vascular: Normal flow voids. Skull and upper cervical spine: Normal marrow signal. Sinuses/Orbits: Mucosal thickening with mucous retention cysts within the right maxillary sinus. The orbits are maintained Other: None. IMPRESSION: Two new enhancing lesions, a 3 mm within the cerebellar vermis and a 4 mm subependymal in the left lateral ventricle, consistent with metastatic disease. No mass effect or edema. These results will be called to the ordering clinician or representative by the Radiologist Assistant, and communication documented in the PACS or [employer] Electronically Signed   By: KPedro EarlsM.D.   On: 11/14/2019 12:46   CT Abdomen Pelvis W Contrast  Result Date:  11/16/2019 CLINICAL DATA:  Primary Cancer Type: Lung Imaging Indication: Routine surveillance Interval therapy since last imaging? Yes Initial Cancer Diagnosis Date: 10/21/2016; established by: Biopsy-proven Detailed Pathology: Stage IV non-small cell lung cancer, poorly differentiated adenocarcinoma. Primary Tumor location: Extensive miliary distribution bilaterally. Metastases to brain. Surgeries: History of pleural catheters.  Hysterectomy. Chemotherapy: Yes; Ongoing? Yes; Most recent administration: 10/26/2019 Alimta and Avastin; Tagrisso daily. Immunotherapy? No Radiation therapy? No Other Cancer Therapies: Prior Gilotrif. EXAM: CT CHEST, ABDOMEN, AND PELVIS WITH CONTRAST TECHNIQUE: Multidetector CT imaging of the chest, abdomen and pelvis was performed following the standard protocol during bolus administration of intravenous contrast. CONTRAST:  1042mOMNIPAQUE IOHEXOL 300 MG/ML  SOLN COMPARISON:  Most recent CT chest, abdomen and pelvis 08/22/2019. 10/31/2016 PET-CT. FINDINGS: CT CHEST FINDINGS [facility]  [facility] the [facility] chest wall with tip in distal SVC. Small pericardial effusion. Mediastinum/Nodes: No axillary supraclavicular adenopathy. No clear mediastinal adenopathy. Lungs/Pleura: Multiple round pulmonary nodules of varying size are increased comparison exam. Example: Nodule in the LEFT upper lobe along the pleural surface measuring 13 mm (image 57/series 6) increased from 4 mm. RIGHT lower lobe nodule medially measuring 13 mm (image 100) increased from 4 mm. Nodule in the azygoesophageal recess measuring 7 mm (image 106) increased from 2 mm. There approximately 50 nodules within the lung. There is focus consolidation in the medial LEFT lower lobe versus atelectasis and small effusion which is similar prior. Musculoskeletal: No aggressive osseous lesion. CT ABDOMEN AND PELVIS FINDINGS Hepatobiliary: No focal hepatic lesion. No biliary ductal dilatation. Gallbladder is normal. Common bile  duct is normal. Pancreas: Pancreas normal. Spleen: Normal spleen Adrenals/urinary tract: Adrenal glands and kidneys are normal. The ureters and bladder normal. Stomach/Bowel: Stomach, small bowel, appendix, and cecum are normal. The colon and rectosigmoid colon are normal. Vascular/Lymphatic: Abdominal aorta is normal caliber. There is no retroperitoneal or periportal lymphadenopathy. No pelvic lymphadenopathy. Reproductive: Post hysterectomy.  Adnexa unremarkable Other: Ino free-fluid.  No peritoneal metastasis. Musculoskeletal: No aggressive osseous lesion. IMPRESSION: Chest Impression: 1. Multiple bilateral pulmonary metastasis increased significantly in size from comparison exam 08/22/2019. Approximately 50 nodules per lung. 2. No clear mediastinal adenopathy. 3. Small pericardial effusion. 4. Small LEFT effusion and basilar atelectasis Abdomen / Pelvis Impression: 1. No evidence of metastatic disease in the abdomen pelvis. 2. No skeletal metastasis Electronically Signed   By: Suzy Bouchard M.D.   On: 11/16/2019 12:09    ASSESSMENT AND PLAN: This is a very pleasant 54 years old African-American female with metastatic non-small cell lung cancer, adenocarcinoma and positive for EGFR mutation with deletion in exon 19.  The patient is currently on treatment with GILOTRIF 40 mg p.o. daily status post 13 months.  The patient is tolerating this treatment well except for few episodes of diarrhea and mild skin rash. She is complaining of dizzy spells and vertigo recently. Repeat CT scan of the chest, abdomen and pelvis performed recently showed some evidence for disease progression with increase in size and number of pulmonary nodules. Her treatment was switched to Tagrisso 80 mg p.o. daily started January 01, 2018.  She is status post 12 months of treatment She had repeat MRI of the brain as well as CT scan of the chest, abdomen pelvis performed. Her MRI of the brain showed no concerning findings for disease  progression in the brain but CT scan of the chest showed some evidence for progression with lymphangitic spread of the disease. She was referred to Dr. Durenda Hurt at [facility] and repeat molecular studies showed the emergence of BRAF mutation V600E.  I recommended for the patient to consider systemic chemotherapy and she is here today for evaluation and discussion of this option.  I had a lengthy discussion with the patient today regarding her condition.  She understands that a combination of BRAF inhibitor and Tagrisso has not been well studied at this point. She is currently undergoing systemic chemotherapy with carboplatin for AUC of 5, Alimta 500 mg/M2 and Avastin 15 mg/KG every 3 weeks, status post 13 cycles.  Starting from cycle #7 she is on maintenance treatment with Alimta and Avastin every 3 weeks.   She also continues her current treatment with Tagrisso 80 mg p.o. daily. She has been tolerating the treatment well with no concerning issues except for fatigue and mild nausea.  She had repeat MRI of the brain that unfortunately showed new small brain lesion. Her CT scan of the chest, abdomen pelvis showed clear evidence for disease progression with more than 50 new and enlarging pulmonary nodules. Clearly her current treatment is not working anymore and I recommended for her to discontinue her current systemic chemotherapy with maintenance Alimta and Avastin but she will continue with Tagrisso for now until we will start the next line of her treatment. 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 I discussed this treatment option with the patient and recommended for treatment with olaparib 300 mg p.o. twice daily.  We will monitor her closely and repeat imaging studies after 2 months of the treatment to evaluate her response to this treatment. If  the patient is not responding to this treatment, we will consider her for second line systemic chemotherapy with docetaxel and Cyramza versus palliative care and hospice referral. For the new brain metastasis, I will refer the patient to radiation oncology for consideration of SRS to the 2 tiny brain metastasis. For the hoarseness of her voice, she will continue her follow-up visit and evaluation by ENT. For the worsening back pain, I increased her fentanyl patch to 50 mcg/hour every 3 days. The patient will come back for follow-up visit in 3 weeks for evaluation and repeat blood work. I will also refer the patient to genetic counseling because of the ATM mutation. For the hoarseness of her voice, we will continue to monitor for now and consider the patient for referral to ENT if she has worsening of her symptoms. She was advised to call immediately if she has any concerning symptoms in the interval. The patient voices understanding of current disease status and treatment options and is in agreement with the current care plan. All questions were answered. The patient knows to call the clinic with any problems, questions or concerns. We can certainly see the patient much sooner if necessary.  Disclaimer: This note was dictated with voice recognition software. Similar sounding words can inadvertently be transcribed and may not be corrected upon review.

## 2019-12-02 ENCOUNTER — Ambulatory Visit
Admission: RE | Admit: 2019-12-02 | Discharge: 2019-12-02 | Disposition: A | Discharge status: Home / Self Care | Payer: Medicare Other | Source: Ambulatory Visit | Attending: Radiation Oncology | Admitting: Radiation Oncology

## 2019-12-02 VITALS — BP 131/74 | HR 90 | Temp 97.2°F | Resp 18 | Ht 66.0 in | Wt 185.0 lb

## 2019-12-02 DIAGNOSIS — Z95828 Presence of other vascular implants and grafts: Secondary | ICD-10-CM

## 2019-12-02 DIAGNOSIS — C7931 Secondary malignant neoplasm of brain: Secondary | ICD-10-CM | POA: Insufficient documentation

## 2019-12-02 DIAGNOSIS — Z510 Encounter for antineoplastic radiation therapy: Secondary | ICD-10-CM | POA: Insufficient documentation

## 2019-12-02 DIAGNOSIS — C3490 Malignant neoplasm of unspecified part of unspecified bronchus or lung: Secondary | ICD-10-CM | POA: Insufficient documentation

## 2019-12-02 MED ORDER — HEPARIN SOD (PORK) LOCK FLUSH 100 UNIT/ML IV SOLN
500.0000 [IU] | Freq: Once | INTRAVENOUS | Status: AC
  Administered 2019-12-02: 500 [IU] via INTRAVENOUS

## 2019-12-02 MED ORDER — SODIUM CHLORIDE 0.9% FLUSH
10.0000 mL | Freq: Once | INTRAVENOUS | Status: AC
  Administered 2019-12-02: 10 mL via INTRAVENOUS

## 2019-12-02 NOTE — Progress Notes (Signed)
Has armband been applied?  Yes.    Does patient have an allergy to IV contrast dye?: No.   Has patient ever received premedication for IV contrast dye?: No.   Does patient take metformin?: No.  Date of lab work: December 01, 2019 BUN: 13 CR: 1.17  IV site: subclavian right, condition patent and no redness  Has IV site been added to flowsheet?  Yes.     Vitals:   12/02/19 1030  BP: 131/74  Pulse: 90  Resp: 18  Temp: (!) 97.2 F (36.2 C)  SpO2: 99%

## 2019-12-05 ENCOUNTER — Other Ambulatory Visit (HOSPITAL_COMMUNITY): Payer: Self-pay | Admitting: Radiation Oncology

## 2019-12-05 ENCOUNTER — Telehealth: Payer: Self-pay | Admitting: Radiation Therapy

## 2019-12-05 ENCOUNTER — Other Ambulatory Visit: Payer: Self-pay | Admitting: Radiation Oncology

## 2019-12-05 DIAGNOSIS — C7931 Secondary malignant neoplasm of brain: Secondary | ICD-10-CM

## 2019-12-05 MED ORDER — LORAZEPAM 1 MG PO TABS: ORAL_TABLET | ORAL | 0 refills | Status: DC

## 2019-12-05 MED FILL — LORazepam 1 MG TABS: 1 | 3 days supply | Qty: 3 | Fill #0

## 2019-12-05 NOTE — Telephone Encounter (Signed)
Called Ms. Goggins to inform her that the Rx for Ativan has been sent to her preferred pharmacy, [place] as requested. The pharmacy representative said that the medication will be mailed out to Ms. Gough today or tomorrow, so it should arrive before the scheduled treatment time on Friday 11/12. The patient knows to have a driver the day of treatment and to take the medication 30 minutes prior to her scheduled appointment.    Mont Dutton R.T.(R)(T) Radiation Special Procedures Navigator

## 2019-12-06 ENCOUNTER — Telehealth: Payer: Self-pay | Admitting: *Deleted

## 2019-12-06 ENCOUNTER — Telehealth: Payer: Self-pay | Admitting: Internal Medicine

## 2019-12-06 DIAGNOSIS — C7931 Secondary malignant neoplasm of brain: Secondary | ICD-10-CM | POA: Diagnosis not present

## 2019-12-06 DIAGNOSIS — I10 Essential (primary) hypertension: Secondary | ICD-10-CM | POA: Diagnosis not present

## 2019-12-06 NOTE — Telephone Encounter (Signed)
Scheduled per los. Called and left msg. Mailed printout  °

## 2019-12-06 NOTE — Telephone Encounter (Signed)
Patient is approved for Lynparza at no cost with [employer] through 01/27/20.  Patient needs to call to setup shipment.

## 2019-12-06 NOTE — Telephone Encounter (Signed)
I called Nancy Moore to check on her. She states she is feeling good today and is set up for SRS this week.  I offered her encouragement.

## 2019-12-08 ENCOUNTER — Other Ambulatory Visit: Payer: Medicare Other

## 2019-12-08 ENCOUNTER — Ambulatory Visit: Payer: Medicare Other

## 2019-12-08 ENCOUNTER — Ambulatory Visit: Payer: Medicare Other | Admitting: Internal Medicine

## 2019-12-08 ENCOUNTER — Other Ambulatory Visit: Payer: Self-pay | Admitting: Physician Assistant

## 2019-12-08 ENCOUNTER — Other Ambulatory Visit (HOSPITAL_COMMUNITY): Payer: Self-pay | Admitting: Internal Medicine

## 2019-12-08 ENCOUNTER — Telehealth: Payer: Self-pay | Admitting: Medical Oncology

## 2019-12-08 DIAGNOSIS — C3492 Malignant neoplasm of unspecified part of left bronchus or lung: Secondary | ICD-10-CM

## 2019-12-08 DIAGNOSIS — R52 Pain, unspecified: Secondary | ICD-10-CM

## 2019-12-08 DIAGNOSIS — C3490 Malignant neoplasm of unspecified part of unspecified bronchus or lung: Secondary | ICD-10-CM | POA: Diagnosis not present

## 2019-12-08 DIAGNOSIS — Z510 Encounter for antineoplastic radiation therapy: Secondary | ICD-10-CM | POA: Diagnosis not present

## 2019-12-08 DIAGNOSIS — C7931 Secondary malignant neoplasm of brain: Secondary | ICD-10-CM | POA: Diagnosis not present

## 2019-12-08 MED ORDER — PROCHLORPERAZINE MALEATE 10 MG PO TABS: 10.0000 mg | ORAL_TABLET | Freq: Four times a day (QID) | ORAL | 2 refills | Status: DC | PRN

## 2019-12-08 MED ORDER — OXYCODONE HCL 5 MG PO TABS: 5.0000 mg | ORAL_TABLET | Freq: Four times a day (QID) | ORAL | 0 refills | Status: DC | PRN

## 2019-12-08 MED FILL — PROCHLORPERAZINE 10 MG TAB: 10 | 8 days supply | Qty: 30 | Fill #2

## 2019-12-08 MED FILL — oxyCODONE HCL 5 MG TABS: 5 | 7 days supply | Qty: 30 | Fill #0

## 2019-12-08 NOTE — Telephone Encounter (Signed)
Oral Chemotherapy Pharmacist Encounter  Patient approved for medication assistance for Lynparza (olaparib) through [employer]. Medication will be delivered to patient's home on 12/08/19.  [facility] [place] start date: 12/09/19  Patient knows to call the office with questions or concerns. Oral Chemotherapy Clinic phone number provided to patient.   Leron Croak, PharmD, BCPS Hematology/Oncology Clinical Pharmacist [facility] [phone] 12/08/2019 12:17 PM

## 2019-12-08 NOTE — Telephone Encounter (Signed)
Requests refill for oxycodone.

## 2019-12-09 ENCOUNTER — Other Ambulatory Visit: Payer: Self-pay | Admitting: Internal Medicine

## 2019-12-09 ENCOUNTER — Encounter: Payer: Self-pay | Admitting: Radiation Oncology

## 2019-12-09 ENCOUNTER — Ambulatory Visit
Admission: RE | Admit: 2019-12-09 | Discharge: 2019-12-09 | Disposition: A | Discharge status: Home / Self Care | Payer: Medicare Other | Source: Ambulatory Visit | Attending: Radiation Oncology | Admitting: Radiation Oncology

## 2019-12-09 VITALS — BP 131/77 | HR 69 | Temp 98.7°F | Resp 20

## 2019-12-09 DIAGNOSIS — C7931 Secondary malignant neoplasm of brain: Secondary | ICD-10-CM

## 2019-12-09 DIAGNOSIS — Z510 Encounter for antineoplastic radiation therapy: Secondary | ICD-10-CM | POA: Diagnosis not present

## 2019-12-09 DIAGNOSIS — C3490 Malignant neoplasm of unspecified part of unspecified bronchus or lung: Secondary | ICD-10-CM | POA: Diagnosis not present

## 2019-12-09 NOTE — Op Note (Signed)
Name: Nancy Moore    MRN: [id number]   Date: 12/09/2019    DOB: 1965-09-15   STEREOTACTIC RADIOSURGERY OPERATIVE NOTE  PRE-OPERATIVE DIAGNOSIS:  Metastatic brain lesions  POST-OPERATIVE DIAGNOSIS:  Metastatic brain lesions  PROCEDURE:   1. Stereotactic Radiosurgery using TrueBeam Linac device to brain metastasis 2. Stereotactic Radiosurgery using TrueBeam Linac device to brain metastasis, add'l lesion x 13  SURGEON:  Duffy Rhody, MD  RADIATION ONCOLOGIST: Eppie Gibson, MD  TECHNIQUE:  The patient underwent a radiation treatment planning session in the radiation oncology simulation suite under the care of the radiation oncology physician and physicist.  I participated closely in the radiation treatment planning afterwards. The patient underwent planning CT which was fused to 3T high resolution MRI with 1 mm axial slices.  These images were fused on the planning system.  We contoured the 14 gross target volumes and subsequently expanded this by 1 mm to yield the Planning Target Volumes. I actively participated in the planning process.  I helped to define and review the target contours and also the contours of the optic pathway, eyes, cochlea, brainstem and selected nearby organs at risk.  All the dose constraints for critical structures were reviewed and compared to AAPM Task Group 101.  The prescription dose conformity was reviewed.  I approved the plan electronically.    Accordingly, [name]  was brought to the TrueBeam stereotactic radiation treatment linac and placed in the custom immobilization mask.  The patient was aligned according to the IR fiducial markers with BrainLab Exactrac, then orthogonal x-rays were used in ExacTrac with the 6DOF robotic table and the shifts were made to align the patient  [name] received stereotactic radiosurgery to a prescription dose of 20 Gy to each of the 14 planned lesions uneventfully.  The detailed description of the procedure is  recorded in the radiation oncology procedure note.  I was present for the duration of the procedure.  DISPOSITION:   Following delivery, the patient was transported to nursing in stable condition and monitored for possible acute effects to be discharged to home in stable condition with follow-up in one month.  Duffy Rhody, MD [facility]

## 2019-12-09 NOTE — Progress Notes (Signed)
30 minute nurse monitoring complete status post 1 of 1 SRS treatments. Patient without complaints. Patient denies new or worsening neurologic symptoms (denies headache, dizziness, nausea, diplopia or ringing in the ears). Vital signs stable. Instructed patient to avoid strenuous activity for the next 24 hours. Instructed patient to call [phone] with needs related to treatment after hours or over the weekend. Patient verbalized understanding. Ambulated out of clinic unassisted and was taken home by family member  Vitals:   12/09/19 1344  BP: 131/77  Pulse: 69  Resp: 20  Temp: 98.7 F (37.1 C)  SpO2: 100%

## 2019-12-14 LAB — GUARDANT 360

## 2019-12-19 NOTE — Progress Notes (Signed)
  Radiation Oncology         [phone] ________________________________  Name: SHAQUILLE MURDY MRN: [id number]  Date: 12/09/2019  DOB: March 29, 1965  Stereotactic Treatment Procedure Note  SPECIAL TREATMENT PROCEDURE  Outpatient    ICD-10-CM   1. Brain metastases ([place])  C79.31     3D TREATMENT PLANNING AND DOSIMETRY:  The patient's radiation plan was reviewed and approved by neurosurgery and radiation oncology prior to treatment.  It showed 3-dimensional radiation distributions overlaid onto the planning CT/MRI image set.  The [facility] for the target structures as well as the organs at risk were reviewed. The documentation of the 3D plan and dosimetry are filed in the radiation oncology EMR.  NARRATIVE:  Olar Santini Irby was brought to the TrueBeam stereotactic radiation treatment machine and placed supine on the CT couch. The head frame was applied, and the patient was set up for stereotactic radiosurgery.  Neurosurgery was present for the set-up and delivery  SIMULATION VERIFICATION:  In the couch zero-angle position, the patient underwent Exactrac imaging using the Brainlab system with orthogonal KV images.  These were carefully aligned and repeated to confirm treatment position for each of the isocenters.  The Exactrac snap film verification was repeated at each couch angle.  SPECIAL TREATMENT PROCEDURE: [name] Gindlesperger received stereotactic radiosurgery to the following targets:   ExacTrac, Max dose=129.3% 7 VMAT Beams PTV1LtCereb14m PTV2LtCereb369mPTV3LtCereb4m64mTV4RtCereb3mm11mV5MidCereb6mm 50m6LtCereb5mm P76mRtCereb4mm PT63mtCereb6mm PTV52mCereb3mm PTV159mPar6mm PTV1177mront3mm PTV12R45mmp3mm PTV13Lt12mnt3mm PTV14LtF25mt3mm  6X-FFF p9mons used;  ExacTrac Snap verification was performed for each couch angle.  This constitutes a special treatment procedure due to the ablative dose delivered and the technical nature of treatment.  This highly technical modality of treatment  ensures that the ablative dose is centered on the patient's tumor while sparing normal tissues from excessive dose and risk of detrimental effects.  STEREOTACTIC TREATMENT MANAGEMENT:  Following delivery, the patient was transported to nursing in stable condition and monitored for possible acute effects.  Vital signs were recorded BP 131/77 (BP Location: Left Arm, Patient Position: Sitting, Cuff Size: Normal)   Pulse 69   Temp 98.7 F (37.1 C)   Resp 20   LMP 03/15/2010   SpO2 100% . The patient tolerated treatment without significant acute effects, and was discharged to home in stable condition.    PLAN: Follow-up in one month.  ________________________________   Johnie Makki, Eppie Gibson

## 2019-12-21 ENCOUNTER — Inpatient Hospital Stay: Payer: Medicare Other

## 2019-12-21 ENCOUNTER — Inpatient Hospital Stay: Payer: Medicare Other | Admitting: Internal Medicine

## 2019-12-21 ENCOUNTER — Other Ambulatory Visit: Payer: Self-pay | Admitting: Medical Oncology

## 2019-12-21 ENCOUNTER — Encounter: Payer: Self-pay | Admitting: Internal Medicine

## 2019-12-21 ENCOUNTER — Other Ambulatory Visit (HOSPITAL_COMMUNITY): Payer: Self-pay | Admitting: Internal Medicine

## 2019-12-21 ENCOUNTER — Other Ambulatory Visit: Payer: Self-pay

## 2019-12-21 VITALS — BP 137/68 | HR 82 | Temp 97.8°F | Resp 18 | Ht 66.0 in | Wt 184.0 lb

## 2019-12-21 DIAGNOSIS — C3492 Malignant neoplasm of unspecified part of left bronchus or lung: Secondary | ICD-10-CM

## 2019-12-21 DIAGNOSIS — K5090 Crohn's disease, unspecified, without complications: Secondary | ICD-10-CM | POA: Diagnosis not present

## 2019-12-21 DIAGNOSIS — C7931 Secondary malignant neoplasm of brain: Secondary | ICD-10-CM

## 2019-12-21 DIAGNOSIS — I10 Essential (primary) hypertension: Secondary | ICD-10-CM | POA: Diagnosis not present

## 2019-12-21 DIAGNOSIS — Z79899 Other long term (current) drug therapy: Secondary | ICD-10-CM | POA: Diagnosis not present

## 2019-12-21 DIAGNOSIS — Z923 Personal history of irradiation: Secondary | ICD-10-CM | POA: Diagnosis not present

## 2019-12-21 DIAGNOSIS — C3490 Malignant neoplasm of unspecified part of unspecified bronchus or lung: Secondary | ICD-10-CM

## 2019-12-21 DIAGNOSIS — Z95828 Presence of other vascular implants and grafts: Secondary | ICD-10-CM

## 2019-12-21 DIAGNOSIS — Z5111 Encounter for antineoplastic chemotherapy: Secondary | ICD-10-CM

## 2019-12-21 DIAGNOSIS — Z9221 Personal history of antineoplastic chemotherapy: Secondary | ICD-10-CM | POA: Diagnosis not present

## 2019-12-21 DIAGNOSIS — K219 Gastro-esophageal reflux disease without esophagitis: Secondary | ICD-10-CM | POA: Diagnosis not present

## 2019-12-21 LAB — CBC WITH DIFFERENTIAL (CANCER CENTER ONLY)
Abs Immature Granulocytes: 0.01 10*3/uL (ref 0.00–0.07)
Basophils Absolute: 0 10*3/uL (ref 0.0–0.1)
Basophils Relative: 0 %
Eosinophils Absolute: 0.2 10*3/uL (ref 0.0–0.5)
Eosinophils Relative: 2 %
HCT: 31 % — ABNORMAL LOW (ref 36.0–46.0)
Hemoglobin: 9.7 g/dL — ABNORMAL LOW (ref 12.0–15.0)
Immature Granulocytes: 0 %
Lymphocytes Relative: 21 %
Lymphs Abs: 1.4 10*3/uL (ref 0.7–4.0)
MCH: 30.8 pg (ref 26.0–34.0)
MCHC: 31.3 g/dL (ref 30.0–36.0)
MCV: 98.4 fL (ref 80.0–100.0)
Monocytes Absolute: 0.8 10*3/uL (ref 0.1–1.0)
Monocytes Relative: 12 %
Neutro Abs: 4.3 10*3/uL (ref 1.7–7.7)
Neutrophils Relative %: 65 %
Platelet Count: 208 10*3/uL (ref 150–400)
RBC: 3.15 MIL/uL — ABNORMAL LOW (ref 3.87–5.11)
RDW: 14.5 % (ref 11.5–15.5)
WBC Count: 6.7 10*3/uL (ref 4.0–10.5)
nRBC: 0.3 % — ABNORMAL HIGH (ref 0.0–0.2)

## 2019-12-21 LAB — CMP (CANCER CENTER ONLY)
ALT: <6 U/L (ref 0–44)
AST: 18 U/L (ref 15–41)
Albumin: 3.2 g/dL — ABNORMAL LOW (ref 3.5–5.0)
Alkaline Phosphatase: 86 U/L (ref 38–126)
Anion gap: 9 (ref 5–15)
BUN: 12 mg/dL (ref 6–20)
CO2: 26 mmol/L (ref 22–32)
Calcium: 9.6 mg/dL (ref 8.9–10.3)
Chloride: 104 mmol/L (ref 98–111)
Creatinine: 1.41 mg/dL — ABNORMAL HIGH (ref 0.44–1.00)
GFR, Estimated: 45 mL/min — ABNORMAL LOW (ref >60)
Glucose, Bld: 92 mg/dL (ref 70–99)
Potassium: 4.2 mmol/L (ref 3.5–5.1)
Sodium: 139 mmol/L (ref 135–145)
Total Bilirubin: 0.3 mg/dL (ref 0.3–1.2)
Total Protein: 7.4 g/dL (ref 6.5–8.1)

## 2019-12-21 LAB — TOTAL PROTEIN, URINE DIPSTICK: Protein, ur: NEGATIVE mg/dL

## 2019-12-21 MED ORDER — PROCHLORPERAZINE MALEATE 10 MG PO TABS: 10.0000 mg | ORAL_TABLET | Freq: Four times a day (QID) | ORAL | 2 refills | Status: AC | PRN

## 2019-12-21 MED ORDER — HEPARIN SOD (PORK) LOCK FLUSH 100 UNIT/ML IV SOLN
500.0000 [IU] | Freq: Once | INTRAVENOUS | Status: AC | PRN
  Administered 2019-12-21: 500 [IU]
  Filled 2019-12-21: qty 5

## 2019-12-21 MED ORDER — SODIUM CHLORIDE 0.9% FLUSH
10.0000 mL | INTRAVENOUS | Status: DC | PRN
  Administered 2019-12-21: 10 mL
  Filled 2019-12-21: qty 10

## 2019-12-21 MED FILL — PROCHLORPERAZINE 10 MG TAB: 10 | 30 days supply | Qty: 30 | Fill #0

## 2019-12-21 NOTE — Progress Notes (Signed)
[place] [phone]   [phone]  OFFICE PROGRESS NOTE  Copland, Gay Filler, MD [address] [place] [address]  DIAGNOSIS: DIAGNOSIS: stage IV (T3, N2, M1 a) non-small cell lung cancer, poorly differentiated adenocarcinoma with extensive miliary distribution in the lungs bilaterally diagnosed in September 2018. 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 NEGATIVE for the Exon 20 T790M mutation. Repeat molecular studies by guardant 360 at time of progression showed ATM [id number] [facility] BRAF mutation, V600E  PRIOR THERAPY:  1) Gilotrif 40 mg daily started 11/08/2016.Status post 13 months of treatment. 2) Tagrisso 80 mg p.o. daily.  First dose started January 01, 2018.  Status post 18 months of treatment. 3) Systemic chemotherapy with carboplatin for AUC 5, Alimta 500 mg/M2 and Avastin 15 mg/KG every 3 weeks.  First dose February 10, 2018.  Status post 13 cycles.  Starting from cycle #7 she will be on maintenance treatment with Alimta and Avastin in addition to her treatment with Tagrisso.  This was discontinued for second disease progression. 4) SBRT to metastatic brain lesions under the care of Dr. SIsidore Moos  CURRENT THERAPY:  Olaparib 300 mg p.o. twice daily.  She started the first dose on December 08, 2019  INTERVAL HISTORY: Nancy Moore 54y.o. female returns to the clinic today for follow-up visit accompanied by her sister.  The patient is feeling fine today with no concerning complaints save for fatigue.  She has been tolerating her treatment with olaparib fairly well except for mild nausea and she take Compazine and occasional Zofran before her treatment.  She denied having any current chest pain, shortness of breath except with exertion with no cough or hemoptysis.  She denied having any fever or chills.  She has no headache or  visual changes.  She is here today for evaluation and repeat blood work.  MEDICAL HISTORY: Past Medical History:  Diagnosis Date  . Adenocarcinoma of left lung, stage 4 ([place] 10/28/2016  . Anemia   . Arthritis   . Constipation   . Crohn's disease ([facility]    in remission 35 years +  . Dyspnea    gets "winded"  . GERD (gastroesophageal reflux disease)   . Goals of care, counseling/discussion 10/28/2016  . History of hiatal hernia    noted on CT 12/20  . Hypertension   . Pneumonia    walking pneumonia in the late 80's  . Recurrent pleural effusion on left     ALLERGIES:  is allergic to percocet [oxycodone-acetaminophen], lisinopril, and losartan.  MEDICATIONS:  Current Outpatient Medications  Medication Sig Dispense Refill  . albuterol (VENTOLIN HFA) 108 (90 Base) MCG/ACT inhaler Inhale 2 puffs into the lungs every 6 (six) hours as needed for wheezing or shortness of breath. 8 g 1  . clindamycin (CLINDAGEL) 1 % gel Apply 1 application topically 2 (two) times daily. 30 g 1  . dexamethasone (DECADRON) 4 MG tablet 1 tablet p.o. twice daily the day before, day of and day after chemotherapy every 3 weeks 20 tablet 2  . doxycycline (VIBRA-TABS) 100 MG tablet Take 1 tablet (100 mg total) by mouth 2 (two) times daily. 20 tablet 0  . fentaNYL (DURAGESIC) 50 MCG/HR Place 1 patch onto the skin every 3 (three) days. 10 patch 0  . fluticasone (FLONASE) 50 MCG/ACT nasal spray Place 2 sprays into both nostrils daily as  needed for allergies. 16 g 3  . folic acid (FOLVITE) 1 MG tablet TAKE 1 TABLET BY MOUTH ONCE A DAY 30 tablet 4  . furosemide (LASIX) 40 MG tablet Take 1 tablet (40 mg total) by mouth daily. 30 tablet 3  . guaiFENesin (MUCINEX) 600 MG 12 hr tablet Take 600 mg by mouth 2 (two) times daily as needed for to loosen phlegm.     Marland Kitchen HYDROcodone-homatropine (HYCODAN) 5-1.5 MG/5ML syrup Take 5 mLs by mouth every 6 (six) hours as needed for cough. 240 mL 0  . lidocaine-prilocaine (EMLA) cream  APPLY TO THE AFFECTED AREA(S) AS NEEDED 30 g 0  . linaclotide (LINZESS) 145 MCG CAPS capsule Take 1 capsule (145 mcg total) by mouth daily before breakfast. 30 capsule 6  . loperamide (IMODIUM) 1 MG/5ML solution Take 10 mLs (2 mg total) by mouth as needed for diarrhea or loose stools. (Patient taking differently: Take 2 mg by mouth daily as needed for diarrhea or loose stools. ) 120 mL 0  . loratadine (CLARITIN) 10 MG tablet Take 10 mg by mouth daily.    Marland Kitchen LORazepam (ATIVAN) 1 MG tablet Take 1 tablet PO 30 min before radiation therapy and/or MRIs. 3 tablet 0  . magnesium oxide (MAG-OX) 400 (241.3 Mg) MG tablet Take 1 tablet (400 mg total) by mouth daily.    . methocarbamol (ROBAXIN) 500 MG tablet TAKE 1 TABLET (500 MG TOTAL) BY MOUTH EVERY 8 (EIGHT) HOURS AS NEEDED FOR MUSCLE SPASMS. 30 tablet 5  . nystatin cream (MYCOSTATIN) APPLY 1 APPLICATION TOPICALLY 2 (TWO) TIMES DAILY. (Patient taking differently: Apply 1 application topically daily as needed (skin irritaion). ) 30 g 0  . olaparib (LYNPARZA) 150 MG tablet Take 2 tablets (300 mg total) by mouth 2 (two) times daily. Swallow whole. May take with food to decrease nausea and vomiting. 120 tablet 2  . ondansetron (ZOFRAN) 8 MG tablet TAKE 1 TABLET BY MOUTH EVERY 8 HOURS AS NEEDED FOR NAUSEA OR VOMITING (Patient taking differently: Take 8 mg by mouth every 8 (eight) hours as needed for nausea or vomiting. ) 30 tablet 2  . oxyCODONE (OXY IR/ROXICODONE) 5 MG immediate release tablet Take 1 tablet (5 mg total) by mouth every 6 (six) hours as needed. for pain 30 tablet 0  . pantoprazole (PROTONIX) 40 MG tablet Take 1 tablet (40 mg total) by mouth daily. 30 tablet 5  . potassium chloride SA (KLOR-CON) 20 MEQ tablet Take 1 tablet (20 mEq total) by mouth 2 (two) times daily. 60 tablet 5  . prochlorperazine (COMPAZINE) 10 MG tablet Take 1 tablet (10 mg total) by mouth every 6 (six) hours as needed for nausea or vomiting. 30 tablet 2  . senna-docusate  (SENOKOT-S) 8.6-50 MG tablet Take 1 tablet by mouth at bedtime.    Marland Kitchen TAGRISSO 80 MG tablet TAKE 1 TABLET (80 MG TOTAL) BY MOUTH DAILY. 30 tablet 2  . traMADol (ULTRAM) 50 MG tablet Take 1 tablet (50 mg total) by mouth every 6 (six) hours as needed. 40 tablet 0   No current facility-administered medications for this visit.    SURGICAL HISTORY:  Past Surgical History:  Procedure Laterality Date  . ABDOMINAL HYSTERECTOMY     partial hysterectomy  . BREAST EXCISIONAL BIOPSY Left 20+ yrs ago   benign  . CHEST TUBE INSERTION Left 10/26/2018   Procedure: INSERTION PLEURAL DRAINAGE CATHETER;  Surgeon: Ivin Poot, MD;  Location: [facility];  Service: Thoracic;  Laterality: Left;  . COLONOSCOPY    .  IR IMAGING GUIDED PORT INSERTION  07/04/2019  . PERICARDIAL WINDOW N/A 01/10/2017   Procedure: PERICARDIAL WINDOW- SUB XYPHOID, RIGHT CHEST TUBE;  Surgeon: Ivin Poot, MD;  Location: [facility];  Service: Thoracic;  Laterality: N/A;  . PORT-A-CATH REMOVAL Left 05/10/2019   Procedure: REMOVAL PORT-A-CATH;  Surgeon: Ivin Poot, MD;  Location: [place];  Service: Thoracic;  Laterality: Left;  . PORTACATH PLACEMENT Left 02/21/2019   Procedure: INSERTION PORT-A-CATH;  Surgeon: Ivin Poot, MD;  Location: [facility];  Service: Thoracic;  Laterality: Left;  . REMOVAL OF PLEURAL DRAINAGE CATHETER Left 02/21/2019   Procedure: REMOVAL OF PLEURAL DRAINAGE CATHETER;  Surgeon: Ivin Poot, MD;  Location: [place];  Service: Thoracic;  Laterality: Left;  Marland Kitchen VIDEO BRONCHOSCOPY Bilateral 10/21/2016   Procedure: VIDEO BRONCHOSCOPY WITH FLUORO;  Surgeon: Tanda Rockers, MD;  Location: [facility];  Service: Cardiopulmonary;  Laterality: Bilateral;    REVIEW OF SYSTEMS:  Constitutional: positive for fatigue Eyes: negative Ears, nose, mouth, throat, and face: negative Respiratory: positive for dyspnea on exertion Cardiovascular: negative Gastrointestinal: negative Genitourinary:negative Integument/breast:  negative Hematologic/lymphatic: negative Musculoskeletal:positive for back pain Neurological: negative Behavioral/Psych: negative Endocrine: negative Allergic/Immunologic: negative   PHYSICAL EXAMINATION: General appearance: alert, cooperative, fatigued and no distress Head: Normocephalic, without obvious abnormality, atraumatic Neck: no adenopathy, no JVD, supple, symmetrical, trachea midline and thyroid not enlarged, symmetric, no tenderness/mass/nodules Lymph nodes: Cervical, supraclavicular, and axillary nodes normal. Resp: clear to auscultation bilaterally Back: symmetric, no curvature. ROM normal. No CVA tenderness. Cardio: regular rate and rhythm, S1, S2 normal, no murmur, click, rub or gallop GI: soft, non-tender; bowel sounds normal; no masses,  no organomegaly Extremities: extremities normal, atraumatic, no cyanosis or edema Neurologic: Alert and oriented X 3, normal strength and tone. Normal symmetric reflexes. Normal coordination and gait  ECOG PERFORMANCE STATUS: 1 - Symptomatic but completely ambulatory  Blood pressure 137/68, pulse 82, temperature 97.8 F (36.6 C), temperature source Tympanic, resp. rate 18, height 5' 6"  (1.676 m), weight 184 lb (83.5 kg), last menstrual period 03/15/2010, SpO2 98 %.  LABORATORY DATA: Lab Results  Component Value Date   WBC 6.7 12/21/2019   HGB 9.7 (L) 12/21/2019   HCT 31.0 (L) 12/21/2019   MCV 98.4 12/21/2019   PLT 208 12/21/2019      Chemistry      Component Value Date/Time   NA 142 12/01/2019 0850   NA 135 (L) 01/08/2017 1055   K 3.7 12/01/2019 0850   K 3.2 (L) 01/08/2017 1055   CL 104 12/01/2019 0850   CO2 29 12/01/2019 0850   CO2 25 01/08/2017 1055   BUN 13 12/01/2019 0850   BUN 14.6 01/08/2017 1055   CREATININE 1.17 (H) 12/01/2019 0850   CREATININE 1.3 (H) 01/08/2017 1055      Component Value Date/Time   CALCIUM 9.7 12/01/2019 0850   CALCIUM 9.5 01/08/2017 1055   ALKPHOS 77 12/01/2019 0850   ALKPHOS 82  01/08/2017 1055   AST 20 12/01/2019 0850   AST 31 01/08/2017 1055   ALT 8 12/01/2019 0850   ALT 34 01/08/2017 1055   BILITOT 0.5 12/01/2019 0850   BILITOT 1.74 (H) 01/08/2017 1055       RADIOGRAPHIC STUDIES: MR Brain W Wo Contrast  Addendum Date: 11/30/2019   ADDENDUM REPORT: 11/30/2019 15:15 ADDENDUM: For this exam the patient's Port-A-Cath was accessed by the on-site RN. Electronically Signed   By: Genevie Ann M.D.   On: 11/30/2019 15:15   Result Date: 11/30/2019 CLINICAL DATA:  54 year old female with non-small cell lung cancer history of innumerable brain metastases in late 2019 which responded to systemic therapy. Two small new enhancing brain lesions on MRI last month compatible with new metastases, and recent progression of systemic disease. SRS treatment planning. EXAM: MRI HEAD WITHOUT AND WITH CONTRAST TECHNIQUE: Multiplanar, multiecho pulse sequences of the brain and surrounding structures were obtained without and with intravenous contrast. CONTRAST:  7m MULTIHANCE GADOBENATE DIMEGLUMINE 529 MG/ML IV SOLN COMPARISON:  Brain MRI 11/14/2019 and earlier. FINDINGS: Brain: On postcontrast images today 7 vs 8 small enhancing lesions of the cerebellum are identified (series 11 images 18 through 48 - with that in the right lateral cerebellum on series 11, image 24 possibly artifact.). These range from punctate to 5-6 mm in size (vermis and right superior cerebellum). Each of these lesions appears to be new from prior studies in July this year and earlier. The vermis lesion was most apparent last month with MPRAGE T1 post technique. No significant edema or mass effect. No definite brainstem lesion. Supratentorially 2 small enhancing lesions are identified, including the 5-6 mm left lateral ventricle subependymal lesion seen last month (series 11, image 87) and a punctate lesion of the inferior right temporal lobe (image 52). Minimal edema with the former. No mass effect. Questionable additional  punctate left inferior frontal gyrus lesion on series 11, image 70. No superimposed restricted diffusion suggestive of acute infarction. No midline shift, mass effect, ventriculomegaly, extra-axial collection or acute intracranial hemorrhage. Cervicomedullary junction and pituitary are within normal limits. No dural thickening identified. Vascular: Major intracranial vascular flow voids are stable. Skull and upper cervical spine: Negative visible cervical spine. Visualized bone marrow signal is within normal limits. Sinuses/Orbits: Stable, negative; small chronic right maxillary retention cysts. Other: Mastoids remain clear. Visible internal auditory structures appear normal. Scalp and face appear negative. IMPRESSION: At least nine and up to eleven small enhancing brain metastases identified today, with up to eight of those in the cerebellum. They range from punctate to 6 mm diameter. The two questionable lesions are in the right lateral cerebellum (series 11, image 24) and the left inferior frontal gyrus (image 70). Minimal edema with no mass effect. Electronically Signed: By: HGenevie AnnM.D. On: 11/30/2019 11:53    ASSESSMENT AND PLAN: This is a very pleasant 54years old African-American female with metastatic non-small cell lung cancer, adenocarcinoma and positive for EGFR mutation with deletion in exon 19.  The patient is currently on treatment with GILOTRIF 40 mg p.o. daily status post 13 months.  The patient is tolerating this treatment well except for few episodes of diarrhea and mild skin rash. She is complaining of dizzy spells and vertigo recently. Repeat CT scan of the chest, abdomen and pelvis performed recently showed some evidence for disease progression with increase in size and number of pulmonary nodules. Her treatment was switched to Tagrisso 80 mg p.o. daily started January 01, 2018.  She is status post 12 months of treatment She had repeat MRI of the brain as well as CT scan of the chest,  abdomen pelvis performed. Her MRI of the brain showed no concerning findings for disease progression in the brain but CT scan of the chest showed some evidence for progression with lymphangitic spread of the disease. [facility] showed the emergence of BRAF mutation V600E.  I recommended for the patient to consider systemic chemotherapy and she is here today for evaluation and discussion  of this option.  I had a lengthy discussion with the patient today regarding her condition.  She understands that a combination of BRAF inhibitor and Tagrisso has not been well studied at this point. She is currently undergoing systemic chemotherapy with carboplatin for AUC of 5, Alimta 500 mg/M2 and Avastin 15 mg/KG every 3 weeks, status post 13 cycles.  Starting from cycle #7 she is on maintenance treatment with Alimta and Avastin every 3 weeks.   She also continues her current treatment with Tagrisso 80 mg p.o. daily. She has been tolerating the treatment well with no concerning issues except for fatigue and mild nausea. She had repeat MRI of the brain that unfortunately showed new small brain lesion. Her CT scan of the chest, abdomen pelvis showed clear evidence for disease progression with more than 50 new and enlarging pulmonary nodules. Clearly her current treatment is not working anymore and I recommended for her to discontinue her current systemic chemotherapy with maintenance Alimta and Avastin but she will continue with Tagrisso for now until we will start the next line of her treatment. 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 The patient is currently on treatment with olaparib 300 mg p.o. twice daily started December 08, 2019.  She has been tolerating this treatment well with no concerning  adverse effect except for mild nausea improved with antiemetics. For the brain metastasis she underwent SRS to the brain lesions. For the back pain, she will continue her current treatment with fentanyl patch as well as the breakthrough medication. She will come back for follow-up visit in 2 weeks for evaluation with repeat blood work for close monitoring of her condition. The patient was advised to call immediately if she has any other concerning symptoms in the interval. The patient voices understanding of current disease status and treatment options and is in agreement with the current care plan. All questions were answered. The patient knows to call the clinic with any problems, questions or concerns. We can certainly see the patient much sooner if necessary.  Disclaimer: This note was dictated with voice recognition software. Similar sounding words can inadvertently be transcribed and may not be corrected upon review.

## 2019-12-23 ENCOUNTER — Telehealth: Payer: Self-pay

## 2019-12-26 ENCOUNTER — Encounter: Payer: Self-pay | Admitting: Family Medicine

## 2019-12-27 ENCOUNTER — Other Ambulatory Visit: Payer: Self-pay | Admitting: Family Medicine

## 2019-12-27 ENCOUNTER — Other Ambulatory Visit: Payer: Self-pay | Admitting: Internal Medicine

## 2019-12-27 ENCOUNTER — Other Ambulatory Visit (HOSPITAL_COMMUNITY): Payer: Self-pay | Admitting: Internal Medicine

## 2019-12-27 DIAGNOSIS — R609 Edema, unspecified: Secondary | ICD-10-CM

## 2019-12-27 MED FILL — PANTOPRAZOLE SOD DR 40 MG T: 40 | 30 days supply | Qty: 30 | Fill #3

## 2019-12-27 MED FILL — FUROSEMIDE 40 MG TAB: 40 | 30 days supply | Qty: 30 | Fill #0

## 2019-12-27 MED FILL — POTASSIUM CHLORIDE CRYS ER: 20 | 30 days supply | Qty: 60 | Fill #3

## 2019-12-28 ENCOUNTER — Ambulatory Visit: Payer: Medicare Other

## 2019-12-28 ENCOUNTER — Ambulatory Visit: Payer: Medicare Other | Admitting: Internal Medicine

## 2019-12-28 ENCOUNTER — Other Ambulatory Visit: Payer: Medicare Other

## 2019-12-28 MED FILL — FOLIC ACID 1 MG TABS: 1 | 30 days supply | Qty: 30 | Fill #0

## 2020-01-03 NOTE — Telephone Encounter (Signed)
Oral Oncology Patient Advocate Encounter  Met patient in [place] to complete re-enrollment application for [place] Patient [place] Program in an effort to reduce the patient's out of pocket expense for Lynparza to $0.    Application completed and faxed to [phone].   [place] patient [place] phone number for follow up is [phone].   This encounter will be updated until final determination.   [place] Patient [place] Phone [phone] Fax [phone] 01/03/2020 2:13 PM

## 2020-01-04 ENCOUNTER — Other Ambulatory Visit: Payer: Self-pay | Admitting: Internal Medicine

## 2020-01-04 ENCOUNTER — Inpatient Hospital Stay: Payer: Medicare Other | Attending: Oncology

## 2020-01-04 ENCOUNTER — Inpatient Hospital Stay: Payer: Medicare Other | Admitting: Internal Medicine

## 2020-01-04 ENCOUNTER — Other Ambulatory Visit: Payer: Medicare Other

## 2020-01-04 ENCOUNTER — Telehealth: Payer: Self-pay

## 2020-01-04 DIAGNOSIS — C3492 Malignant neoplasm of unspecified part of left bronchus or lung: Secondary | ICD-10-CM | POA: Insufficient documentation

## 2020-01-04 DIAGNOSIS — Z23 Encounter for immunization: Secondary | ICD-10-CM | POA: Insufficient documentation

## 2020-01-04 DIAGNOSIS — R630 Anorexia: Secondary | ICD-10-CM | POA: Insufficient documentation

## 2020-01-04 DIAGNOSIS — R110 Nausea: Secondary | ICD-10-CM | POA: Insufficient documentation

## 2020-01-04 DIAGNOSIS — C7931 Secondary malignant neoplasm of brain: Secondary | ICD-10-CM | POA: Insufficient documentation

## 2020-01-04 DIAGNOSIS — I10 Essential (primary) hypertension: Secondary | ICD-10-CM | POA: Insufficient documentation

## 2020-01-04 DIAGNOSIS — R42 Dizziness and giddiness: Secondary | ICD-10-CM | POA: Insufficient documentation

## 2020-01-04 DIAGNOSIS — M549 Dorsalgia, unspecified: Secondary | ICD-10-CM | POA: Insufficient documentation

## 2020-01-04 DIAGNOSIS — Z79899 Other long term (current) drug therapy: Secondary | ICD-10-CM | POA: Insufficient documentation

## 2020-01-04 MED ORDER — HYDROCODONE-HOMATROPINE 5-1.5 MG/5ML PO SYRP
5.0000 mL | ORAL_SOLUTION | Freq: Four times a day (QID) | ORAL | 0 refills | Status: DC | PRN
Start: 2020-01-04 — End: 2020-01-24

## 2020-01-04 NOTE — Telephone Encounter (Signed)
Pt called indicating she has a cough and it causes her to vomit.  Discussed with Dr. Julien Nordmann who advise he will send a refill of Hycodan. Pt has been advised of this and expressed understanding of this information.

## 2020-01-05 MED FILL — PROCHLORPERAZINE 10 MG TAB: 10 | 8 days supply | Qty: 30 | Fill #0

## 2020-01-10 ENCOUNTER — Telehealth: Payer: Self-pay | Admitting: Internal Medicine

## 2020-01-10 ENCOUNTER — Inpatient Hospital Stay: Payer: Medicare Other

## 2020-01-10 ENCOUNTER — Ambulatory Visit
Admission: RE | Admit: 2020-01-10 | Discharge: 2020-01-10 | Disposition: A | Discharge status: Home / Self Care | Payer: Medicare Other | Source: Ambulatory Visit | Attending: Radiation Oncology | Admitting: Radiation Oncology

## 2020-01-10 ENCOUNTER — Inpatient Hospital Stay (HOSPITAL_BASED_OUTPATIENT_CLINIC_OR_DEPARTMENT_OTHER): Payer: Medicare Other | Admitting: Internal Medicine

## 2020-01-10 ENCOUNTER — Encounter: Payer: Self-pay | Admitting: Internal Medicine

## 2020-01-10 ENCOUNTER — Other Ambulatory Visit: Payer: Self-pay

## 2020-01-10 ENCOUNTER — Other Ambulatory Visit (HOSPITAL_COMMUNITY): Payer: Self-pay | Admitting: Internal Medicine

## 2020-01-10 VITALS — BP 137/81 | HR 88 | Temp 97.7°F | Resp 20 | Ht 66.0 in | Wt 177.6 lb

## 2020-01-10 VITALS — BP 143/85 | HR 93 | Temp 99.1°F | Resp 18 | Ht 66.0 in | Wt 177.5 lb

## 2020-01-10 DIAGNOSIS — C7931 Secondary malignant neoplasm of brain: Secondary | ICD-10-CM

## 2020-01-10 DIAGNOSIS — C3492 Malignant neoplasm of unspecified part of left bronchus or lung: Secondary | ICD-10-CM

## 2020-01-10 DIAGNOSIS — M549 Dorsalgia, unspecified: Secondary | ICD-10-CM | POA: Diagnosis not present

## 2020-01-10 DIAGNOSIS — R630 Anorexia: Secondary | ICD-10-CM | POA: Diagnosis not present

## 2020-01-10 DIAGNOSIS — Z5111 Encounter for antineoplastic chemotherapy: Secondary | ICD-10-CM | POA: Diagnosis not present

## 2020-01-10 DIAGNOSIS — C3490 Malignant neoplasm of unspecified part of unspecified bronchus or lung: Secondary | ICD-10-CM | POA: Diagnosis not present

## 2020-01-10 DIAGNOSIS — C3432 Malignant neoplasm of lower lobe, left bronchus or lung: Secondary | ICD-10-CM | POA: Diagnosis not present

## 2020-01-10 DIAGNOSIS — K5900 Constipation, unspecified: Secondary | ICD-10-CM | POA: Insufficient documentation

## 2020-01-10 DIAGNOSIS — I10 Essential (primary) hypertension: Secondary | ICD-10-CM | POA: Insufficient documentation

## 2020-01-10 DIAGNOSIS — Z79899 Other long term (current) drug therapy: Secondary | ICD-10-CM | POA: Diagnosis not present

## 2020-01-10 DIAGNOSIS — Z23 Encounter for immunization: Secondary | ICD-10-CM | POA: Diagnosis not present

## 2020-01-10 DIAGNOSIS — R251 Tremor, unspecified: Secondary | ICD-10-CM | POA: Diagnosis not present

## 2020-01-10 DIAGNOSIS — R42 Dizziness and giddiness: Secondary | ICD-10-CM | POA: Insufficient documentation

## 2020-01-10 DIAGNOSIS — R110 Nausea: Secondary | ICD-10-CM | POA: Diagnosis not present

## 2020-01-10 DIAGNOSIS — Z95828 Presence of other vascular implants and grafts: Secondary | ICD-10-CM

## 2020-01-10 LAB — CBC WITH DIFFERENTIAL (CANCER CENTER ONLY)
Abs Immature Granulocytes: 0.03 10*3/uL (ref 0.00–0.07)
Basophils Absolute: 0.1 10*3/uL (ref 0.0–0.1)
Basophils Relative: 1 %
Eosinophils Absolute: 0.1 10*3/uL (ref 0.0–0.5)
Eosinophils Relative: 1 %
HCT: 31.6 % — ABNORMAL LOW (ref 36.0–46.0)
Hemoglobin: 10.3 g/dL — ABNORMAL LOW (ref 12.0–15.0)
Immature Granulocytes: 0 %
Lymphocytes Relative: 21 %
Lymphs Abs: 1.9 10*3/uL (ref 0.7–4.0)
MCH: 31.4 pg (ref 26.0–34.0)
MCHC: 32.6 g/dL (ref 30.0–36.0)
MCV: 96.3 fL (ref 80.0–100.0)
Monocytes Absolute: 1 10*3/uL (ref 0.1–1.0)
Monocytes Relative: 11 %
Neutro Abs: 5.9 10*3/uL (ref 1.7–7.7)
Neutrophils Relative %: 66 %
Platelet Count: 326 10*3/uL (ref 150–400)
RBC: 3.28 MIL/uL — ABNORMAL LOW (ref 3.87–5.11)
RDW: 17.1 % — ABNORMAL HIGH (ref 11.5–15.5)
WBC Count: 8.9 10*3/uL (ref 4.0–10.5)
nRBC: 0.3 % — ABNORMAL HIGH (ref 0.0–0.2)

## 2020-01-10 LAB — CMP (CANCER CENTER ONLY)
ALT: 8 U/L (ref 0–44)
AST: 18 U/L (ref 15–41)
Albumin: 3.3 g/dL — ABNORMAL LOW (ref 3.5–5.0)
Alkaline Phosphatase: 93 U/L (ref 38–126)
Anion gap: 10 (ref 5–15)
BUN: 9 mg/dL (ref 6–20)
CO2: 26 mmol/L (ref 22–32)
Calcium: 10.1 mg/dL (ref 8.9–10.3)
Chloride: 103 mmol/L (ref 98–111)
Creatinine: 1.13 mg/dL — ABNORMAL HIGH (ref 0.44–1.00)
GFR, Estimated: 58 mL/min — ABNORMAL LOW (ref >60)
Glucose, Bld: 103 mg/dL — ABNORMAL HIGH (ref 70–99)
Potassium: 3.5 mmol/L (ref 3.5–5.1)
Sodium: 139 mmol/L (ref 135–145)
Total Bilirubin: 0.7 mg/dL (ref 0.3–1.2)
Total Protein: 7.9 g/dL (ref 6.5–8.1)

## 2020-01-10 LAB — TOTAL PROTEIN, URINE DIPSTICK

## 2020-01-10 MED ORDER — HEPARIN SOD (PORK) LOCK FLUSH 100 UNIT/ML IV SOLN
500.0000 [IU] | Freq: Once | INTRAVENOUS | Status: AC | PRN
  Administered 2020-01-10: 500 [IU]
  Filled 2020-01-10: qty 5

## 2020-01-10 MED ORDER — SODIUM CHLORIDE 0.9% FLUSH
10.0000 mL | INTRAVENOUS | Status: DC | PRN
  Administered 2020-01-10: 10 mL
  Filled 2020-01-10: qty 10

## 2020-01-10 MED ORDER — METHYLPREDNISOLONE 4 MG PO TBPK: ORAL_TABLET | ORAL | 0 refills | Status: DC

## 2020-01-10 MED FILL — METHYLPREDNISOLONE 4 MG TBP: 4 | 6 days supply | Qty: 21 | Fill #0

## 2020-01-10 NOTE — Progress Notes (Signed)
Ms. Bari presents today for 1 month follow-up after completing [place] treatment to her brain on 12/09/2019  Headaches: patient denies any issues Nausea: reports occasional nausea based on medications she has to take. She also reports a decrease in appetite  Wt Readings from Last 3 Encounters:  01/10/20 177 lb 9.6 oz (80.6 kg)  12/21/19 184 lb (83.5 kg)  12/02/19 185 lb (83.9 kg)   Dizziness/ataxia: patient denies Difficulty with hand coordination: patient denies Focal numbness/weakness: patient denies Visual deficits/changes: patient denies Confusion/Memory deficits: patient denies. Occasionally has difficult remembering something immediately, but states it comes to her after a few minutes Bowel/Bladder concerns: patient denies any issues or concerns. Reports occasional constipation, but feels she is able to manage effectively  Other issues of note: Reports an occasional tremor in both hands. States she must put both hands in her lap and close her eyes to calm herself, and then the trembling will resolve. Has follow-up with Dr. Curt Bears later today  Vitals:   01/10/20 1431  BP: 137/81  Pulse: 88  Resp: 20  Temp: 97.7 F (36.5 C)  SpO2: 98%

## 2020-01-10 NOTE — Telephone Encounter (Signed)
Scheduled appointments per 12/14 los. Spoke to patient who is aware of appointments dates and times.

## 2020-01-10 NOTE — Progress Notes (Signed)
[place] [phone]   [phone]  OFFICE PROGRESS NOTE  Copland, Gay Filler, MD [address] [place] [address]  DIAGNOSIS: DIAGNOSIS: stage IV (T3, N2, M1 a) non-small cell lung cancer, poorly differentiated adenocarcinoma with extensive miliary distribution in the lungs bilaterally diagnosed in September 2018. 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 NEGATIVE for the Exon 20 T790M mutation. Repeat molecular studies by guardant 360 at time of progression showed ATM [id number] [facility] the [facility] of BRAF mutation, V600E but this was not detected on the molecular studies repeated after completion of her chemotherapy.  PRIOR THERAPY:  1) Gilotrif 40 mg daily started 11/08/2016.Status post 13 months of treatment. 2) Tagrisso 80 mg p.o. daily.  First dose started January 01, 2018.  Status post 18 months of treatment. 3) Systemic chemotherapy with carboplatin for AUC 5, Alimta 500 mg/M2 and Avastin 15 mg/KG every 3 weeks.  First dose February 10, 2018.  Status post 13 cycles.  Starting from cycle #7 she will be on maintenance treatment with Alimta and Avastin in addition to her treatment with Tagrisso.  This was discontinued for second disease progression. 4) SBRT to metastatic brain lesions under the care of Dr. SIsidore Moos  CURRENT THERAPY:  Olaparib 300 mg p.o. twice daily.  She started the first dose on December 08, 2019.   INTERVAL HISTORY: [name] 54y.o. female returns to the clinic today for follow-up visit.  The patient is feeling fine except for fatigue and lack of appetite.  She lost few pounds since her last visit.  She has intermittent nausea but she has Compazine and Zofran at home.  She denied having any chest pain, shortness of breath, cough or hemoptysis.  She denied having any fever or chills.  She has no vomiting, diarrhea or  constipation.  She denied having any headache or visual changes.  She is here today for evaluation and repeat blood work.  MEDICAL HISTORY: Past Medical History:  Diagnosis Date  . Adenocarcinoma of left lung, stage 4 ([facility] 10/28/2016  . Anemia   . Arthritis   . Constipation   . Crohn's disease ([facility]    in remission 35 years +  . Dyspnea    gets "winded"  . GERD (gastroesophageal reflux disease)   . Goals of care, counseling/discussion 10/28/2016  . History of hiatal hernia    noted on CT 12/20  . Hypertension   . Pneumonia    walking pneumonia in the late 80's  . Recurrent pleural effusion on left     ALLERGIES:  is allergic to percocet [oxycodone-acetaminophen], lisinopril, and losartan.  MEDICATIONS:  Current Outpatient Medications  Medication Sig Dispense Refill  . albuterol (VENTOLIN HFA) 108 (90 Base) MCG/ACT inhaler Inhale 2 puffs into the lungs every 6 (six) hours as needed for wheezing or shortness of breath. 8 g 1  . clindamycin (CLINDAGEL) 1 % gel Apply 1 application topically 2 (two) times daily. 30 g 1  . dexamethasone (DECADRON) 4 MG tablet 1 tablet p.o. twice daily the day before, day of and day after chemotherapy every 3 weeks 20 tablet 2  . doxycycline (VIBRA-TABS) 100 MG tablet Take 1 tablet (100 mg total) by mouth 2 (two) times daily. 20 tablet 0  . fentaNYL (DURAGESIC) 50 MCG/HR Place 1 patch onto the skin every 3 (three) days. 10 patch 0  . fluticasone (FLONASE) 50  MCG/ACT nasal spray Place 2 sprays into both nostrils daily as needed for allergies. 16 g 3  . folic acid (FOLVITE) 1 MG tablet TAKE 1 TABLET BY MOUTH ONCE A DAY 30 tablet 4  . furosemide (LASIX) 40 MG tablet TAKE 1 TABLET BY MOUTH ONCE DAILY 30 tablet 3  . guaiFENesin (MUCINEX) 600 MG 12 hr tablet Take 600 mg by mouth 2 (two) times daily as needed for to loosen phlegm.     Marland Kitchen HYDROcodone-homatropine (HYCODAN) 5-1.5 MG/5ML syrup Take 5 mLs by mouth every 6 (six) hours as needed for cough. 120 mL 0  .  lidocaine-prilocaine (EMLA) cream APPLY TO THE AFFECTED AREA(S) AS NEEDED 30 g 0  . linaclotide (LINZESS) 145 MCG CAPS capsule Take 1 capsule (145 mcg total) by mouth daily before breakfast. 30 capsule 6  . loperamide (IMODIUM) 1 MG/5ML solution Take 10 mLs (2 mg total) by mouth as needed for diarrhea or loose stools. (Patient taking differently: Take 2 mg by mouth daily as needed for diarrhea or loose stools.) 120 mL 0  . loratadine (CLARITIN) 10 MG tablet Take 10 mg by mouth daily.    Marland Kitchen LORazepam (ATIVAN) 1 MG tablet Take 1 tablet PO 30 min before radiation therapy and/or MRIs. 3 tablet 0  . magnesium oxide (MAG-OX) 400 (241.3 Mg) MG tablet Take 1 tablet (400 mg total) by mouth daily.    . methocarbamol (ROBAXIN) 500 MG tablet TAKE 1 TABLET (500 MG TOTAL) BY MOUTH EVERY 8 (EIGHT) HOURS AS NEEDED FOR MUSCLE SPASMS. 30 tablet 5  . nystatin cream (MYCOSTATIN) APPLY 1 APPLICATION TOPICALLY 2 (TWO) TIMES DAILY. (Patient taking differently: Apply 1 application topically daily as needed (skin irritaion).) 30 g 0  . olaparib (LYNPARZA) 150 MG tablet Take 2 tablets (300 mg total) by mouth 2 (two) times daily. Swallow whole. May take with food to decrease nausea and vomiting. 120 tablet 2  . ondansetron (ZOFRAN) 8 MG tablet TAKE 1 TABLET BY MOUTH EVERY 8 HOURS AS NEEDED FOR NAUSEA OR VOMITING (Patient taking differently: Take 8 mg by mouth every 8 (eight) hours as needed for nausea or vomiting.) 30 tablet 2  . oxyCODONE (OXY IR/ROXICODONE) 5 MG immediate release tablet Take 1 tablet (5 mg total) by mouth every 6 (six) hours as needed. for pain 30 tablet 0  . pantoprazole (PROTONIX) 40 MG tablet Take 1 tablet (40 mg total) by mouth daily. 30 tablet 5  . potassium chloride SA (KLOR-CON) 20 MEQ tablet Take 1 tablet (20 mEq total) by mouth 2 (two) times daily. 60 tablet 5  . prochlorperazine (COMPAZINE) 10 MG tablet Take 1 tablet (10 mg total) by mouth every 6 (six) hours as needed for nausea or vomiting. 30 tablet  2  . senna-docusate (SENOKOT-S) 8.6-50 MG tablet Take 1 tablet by mouth at bedtime.    . traMADol (ULTRAM) 50 MG tablet Take 1 tablet (50 mg total) by mouth every 6 (six) hours as needed. 40 tablet 0   No current facility-administered medications for this visit.    SURGICAL HISTORY:  Past Surgical History:  Procedure Laterality Date  . ABDOMINAL HYSTERECTOMY     partial hysterectomy  . BREAST EXCISIONAL BIOPSY Left 20+ yrs ago   benign  . CHEST TUBE INSERTION Left 10/26/2018   Procedure: INSERTION PLEURAL DRAINAGE CATHETER;  Surgeon: Ivin Poot, MD;  Location: [facility];  Service: Thoracic;  Laterality: Left;  . COLONOSCOPY    . IR IMAGING GUIDED PORT INSERTION  07/04/2019  .  PERICARDIAL WINDOW N/A 01/10/2017   Procedure: PERICARDIAL WINDOW- SUB XYPHOID, RIGHT CHEST TUBE;  Surgeon: Ivin Poot, MD;  Location: [facility];  Service: Thoracic;  Laterality: N/A;  . PORT-A-CATH REMOVAL Left 05/10/2019   Procedure: REMOVAL PORT-A-CATH;  Surgeon: Ivin Poot, MD;  Location: [facility];  Service: Thoracic;  Laterality: Left;  . PORTACATH PLACEMENT Left 02/21/2019   Procedure: INSERTION PORT-A-CATH;  Surgeon: Ivin Poot, MD;  Location: [facility];  Service: Thoracic;  Laterality: Left;  . REMOVAL OF PLEURAL DRAINAGE CATHETER Left 02/21/2019   Procedure: REMOVAL OF PLEURAL DRAINAGE CATHETER;  Surgeon: Ivin Poot, MD;  Location: [facility];  Service: Thoracic;  Laterality: Left;  Marland Kitchen VIDEO BRONCHOSCOPY Bilateral 10/21/2016   Procedure: VIDEO BRONCHOSCOPY WITH FLUORO;  Surgeon: Tanda Rockers, MD;  Location: [facility];  Service: Cardiopulmonary;  Laterality: Bilateral;    REVIEW OF SYSTEMS:  A comprehensive review of systems was negative except for: Constitutional: positive for anorexia, fatigue and weight loss Gastrointestinal: positive for nausea   PHYSICAL EXAMINATION: General appearance: alert, cooperative, fatigued and no distress Head: Normocephalic, without obvious abnormality,  atraumatic Neck: no adenopathy, no JVD, supple, symmetrical, trachea midline and thyroid not enlarged, symmetric, no tenderness/mass/nodules Lymph nodes: Cervical, supraclavicular, and axillary nodes normal. Resp: clear to auscultation bilaterally Back: symmetric, no curvature. ROM normal. No CVA tenderness. Cardio: regular rate and rhythm, S1, S2 normal, no murmur, click, rub or gallop GI: soft, non-tender; bowel sounds normal; no masses,  no organomegaly Extremities: extremities normal, atraumatic, no cyanosis or edema  ECOG PERFORMANCE STATUS: 1 - Symptomatic but completely ambulatory  Blood pressure (!) 143/85, pulse 93, temperature 99.1 F (37.3 C), temperature source Tympanic, resp. rate 18, height 5' 6"  (1.676 m), weight 177 lb 8 oz (80.5 kg), last menstrual period 03/15/2010, SpO2 98 %.  LABORATORY DATA: Lab Results  Component Value Date   WBC 8.9 01/10/2020   HGB 10.3 (L) 01/10/2020   HCT 31.6 (L) 01/10/2020   MCV 96.3 01/10/2020   PLT 326 01/10/2020      Chemistry      Component Value Date/Time   NA 139 01/10/2020 1415   NA 135 (L) 01/08/2017 1055   K 3.5 01/10/2020 1415   K 3.2 (L) 01/08/2017 1055   CL 103 01/10/2020 1415   CO2 26 01/10/2020 1415   CO2 25 01/08/2017 1055   BUN 9 01/10/2020 1415   BUN 14.6 01/08/2017 1055   CREATININE 1.13 (H) 01/10/2020 1415   CREATININE 1.3 (H) 01/08/2017 1055      Component Value Date/Time   CALCIUM 10.1 01/10/2020 1415   CALCIUM 9.5 01/08/2017 1055   ALKPHOS 93 01/10/2020 1415   ALKPHOS 82 01/08/2017 1055   AST 18 01/10/2020 1415   AST 31 01/08/2017 1055   ALT 8 01/10/2020 1415   ALT 34 01/08/2017 1055   BILITOT 0.7 01/10/2020 1415   BILITOT 1.74 (H) 01/08/2017 1055       RADIOGRAPHIC STUDIES: No results found.  ASSESSMENT AND PLAN: This is a very pleasant 54 years old African-American female with metastatic non-small cell lung cancer, adenocarcinoma and positive for EGFR mutation with deletion in exon 19.  The  patient is currently on treatment with GILOTRIF 40 mg p.o. daily status post 13 months.  The patient is tolerating this treatment well except for few episodes of diarrhea and mild skin rash. She is complaining of dizzy spells and vertigo recently. Repeat CT scan of the chest, abdomen and pelvis performed recently showed some  evidence for disease progression with increase in size and number of pulmonary nodules. Her treatment was switched to Tagrisso 80 mg p.o. daily started January 01, 2018.  She is status post 12 months of treatment She had repeat MRI of the brain as well as CT scan of the chest, abdomen pelvis performed. Her MRI of the brain showed no concerning findings for disease progression in the brain but CT scan of the chest showed some evidence for progression with lymphangitic spread of the disease. She was referred to Dr. Durenda Hurt at [facility] and repeat molecular studies showed the emergence of BRAF mutation V600E.  I recommended for the patient to consider systemic chemotherapy and she is here today for evaluation and discussion of this option.  I had a lengthy discussion with the patient today regarding her condition.  She understands that a combination of BRAF inhibitor and Tagrisso has not been well studied at this point. She is currently undergoing systemic chemotherapy with carboplatin for AUC of 5, Alimta 500 mg/M2 and Avastin 15 mg/KG every 3 weeks, status post 13 cycles.  Starting from cycle #7 she is on maintenance treatment with Alimta and Avastin every 3 weeks.   She also continues her current treatment with Tagrisso 80 mg p.o. daily. She has been tolerating the treatment well with no concerning issues except for fatigue and mild nausea. She had repeat MRI of the brain that unfortunately showed new small brain lesion. Her CT scan of the chest, abdomen pelvis showed clear evidence for disease progression with more than 50 new and enlarging pulmonary nodules. Clearly her  current treatment is not working anymore and I recommended for her to discontinue her current systemic chemotherapy with maintenance Alimta and Avastin but she will continue with Tagrisso for now until we will start the next line of her treatment. 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 The patient is currently on treatment with olaparib 300 mg p.o. twice daily started December 08, 2019.  She is status post 1 months of treatment.  The patient is tolerating the treatment well except for fatigue and occasional nausea. For the brain metastasis she underwent SRS to the brain lesions. I recommended for her to continue her current treatment with olaparib with the same dose. I will see her back for follow-up visit in 1 months with repeat CT scan of the chest, abdomen pelvis as well as MRI of the brain. For the back pain, she will continue her current treatment with fentanyl patch as well as the breakthrough medication.   For the weight loss and lack of appetite, I will start the patient on Medrol Dosepak. She will receive her booster Covid shot today. She was advised to call immediately if she has any concerning symptoms in the interval. The patient voices understanding of current disease status and treatment options and is in agreement with the current care plan. All questions were answered. The patient knows to call the clinic with any problems, questions or concerns. We can certainly see the patient much sooner if necessary.  Disclaimer: This note was dictated with voice recognition software. Similar sounding words can inadvertently be transcribed and may not be corrected upon review.

## 2020-01-10 NOTE — Progress Notes (Signed)
   Covid-19 Vaccination Clinic  Name:  Nancy Moore    MRN: [id number] DOB: [birth year]  01/10/2020  [name] was observed post Covid-19 immunization for 15 minutes without incident. She was provided with Vaccine Information Sheet and instruction to access the V-Safe system.   [name] was instructed to call 911 with any severe reactions post vaccine: Marland Kitchen Difficulty breathing  . Swelling of face and throat  . A fast heartbeat  . A bad rash all over body  . Dizziness and weakness   Immunizations Administered    Name Date Dose VIS Date Route   Pfizer COVID-19 Vaccine 01/10/2020  3:50 PM 0.3 mL 11/16/2019 Intramuscular   Manufacturer: [place]   Lot: [id number]   [place]illy: [id number]

## 2020-01-11 ENCOUNTER — Encounter: Payer: Self-pay | Admitting: Radiation Oncology

## 2020-01-11 NOTE — Progress Notes (Signed)
Radiation Oncology         [phone] ________________________________  Name: Nancy Moore MRN: [id number]  Date: 01/10/2020  DOB: 11-Mar-1965  Follow-Up Visit Note  Outpatient  CC: Copland, Nancy Filler, MD  Copland, Nancy Filler, MD  Diagnosis and Prior Radiotherapy:    ICD-10-CM   1. Brain metastases ([place])  C79.31    12/09/19 SRS tx: 20Gy/ 1 fx Max dose=129.3% 7 VMAT Beams PTV1LtCereb5m PTV2LtCereb323mPTV3LtCereb4m67mTV4RtCereb3mm70mV5MidCereb6mm 63m6LtCereb5mm P62mRtCereb4mm PT69mtCereb6mm PTV61mCereb3mm PTV16mPar6mm PTV1181mront3mm PTV12R16mmp3mm PTV13Lt5mnt3mm PTV14LtF80mt3mm    CHIEF 8mPLAINT: Here for follow-up and surveillance of brain cancer  Narrative:  The patient returns today for routine follow-up.   Ms. Guillette presents Nancy Moore for 1 month follow-up after completing SRS treatment [place] brain on 12/09/2019  Headaches: patient denies any issues Nausea: reports occasional nausea based on medications she has to take. She also reports a decrease in appetite  Wt Readings from Last 3 Encounters:  01/10/20 177 lb 9.6 oz (80.6 kg)  01/10/20 177 lb 8 oz (80.5 kg)  12/21/19 184 lb (83.5 kg)   Dizziness/ataxia: patient denies Difficulty with hand coordination: patient denies Focal numbness/weakness: patient denies Visual deficits/changes: patient denies Confusion/Memory deficits: patient denies. Occasionally has difficult remembering something immediately, but states it comes to her after a few minutes Bowel/Bladder concerns: patient denies any issues or concerns. Reports occasional constipation, but feels she is able to manage effectively  Other issues of note: Reports an occasional tremor in both hands. States she must put both hands in her lap and close her eyes to calm herself, and then the trembling will resolve. Has follow-up with Dr. Mohamed MohameCurt BearsVitals:   01/10/20 1431  BP: 137/81  Pulse: 88  Resp: 20  Temp: 97.7 F (36.5 C)   SpO2: 98%                                ALLERGIES:  is allergic to percocet [oxycodone-acetaminophen], lisinopril, and losartan.  Meds: Current Outpatient Medications  Medication Sig Dispense Refill  . albuterol (VENTOLIN HFA) 108 (90 Base) MCG/ACT inhaler Inhale 2 puffs into the lungs every 6 (six) hours as needed for wheezing or shortness of breath. 8 g 1  . clindamycin (CLINDAGEL) 1 % gel Apply 1 application topically 2 (two) times daily. 30 g 1  . dexamethasone (DECADRON) 4 MG tablet 1 tablet p.o. twice daily the day before, day of and day after chemotherapy every 3 weeks 20 tablet 2  . doxycycline (VIBRA-TABS) 100 MG tablet Take 1 tablet (100 mg total) by mouth 2 (two) times daily. 20 tablet 0  . fentaNYL (DURAGESIC) 50 MCG/HR Place 1 patch onto the skin every 3 (three) days. 10 patch 0  . fluticasone (FLONASE) 50 MCG/ACT nasal spray Place 2 sprays into both nostrils daily as needed for allergies. 16 g 3  . folic acid (FOLVITE) 1 MG tablet TAKE 1 TABLET BY MOUTH ONCE A DAY 30 tablet 4  . furosemide (LASIX) 40 MG tablet TAKE 1 TABLET BY MOUTH ONCE DAILY 30 tablet 3  . guaiFENesin (MUCINEX) 600 MG 12 hr tablet Take 600 mg by mouth 2 (two) times daily as needed for to loosen phlegm.     . HYDROcodone-Marland Kitchenomatropine (HYCODAN) 5-1.5 MG/5ML syrup Take 5 mLs by mouth every 6 (six) hours as needed for cough. 120 mL 0  . lidocaine-prilocaine (EMLA) cream APPLY TO THE AFFECTED AREA(S) AS NEEDED 30 g 0  .  linaclotide (LINZESS) 145 MCG CAPS capsule Take 1 capsule (145 mcg total) by mouth daily before breakfast. 30 capsule 6  . loperamide (IMODIUM) 1 MG/5ML solution Take 10 mLs (2 mg total) by mouth as needed for diarrhea or loose stools. (Patient taking differently: Take 2 mg by mouth daily as needed for diarrhea or loose stools.) 120 mL 0  . loratadine (CLARITIN) 10 MG tablet Take 10 mg by mouth daily.    Marland Kitchen LORazepam (ATIVAN) 1 MG tablet Take 1 tablet PO 30 min before radiation therapy and/or MRIs. 3  tablet 0  . magnesium oxide (MAG-OX) 400 (241.3 Mg) MG tablet Take 1 tablet (400 mg total) by mouth daily.    . methocarbamol (ROBAXIN) 500 MG tablet TAKE 1 TABLET (500 MG TOTAL) BY MOUTH EVERY 8 (EIGHT) HOURS AS NEEDED FOR MUSCLE SPASMS. 30 tablet 5  . methylPREDNISolone (MEDROL DOSEPAK) 4 MG TBPK tablet Use as instructed.  Start Monday, January 16, 2020. 21 tablet 0  . nystatin cream (MYCOSTATIN) APPLY 1 APPLICATION TOPICALLY 2 (TWO) TIMES DAILY. (Patient taking differently: Apply 1 application topically daily as needed (skin irritaion).) 30 g 0  . olaparib (LYNPARZA) 150 MG tablet Take 2 tablets (300 mg total) by mouth 2 (two) times daily. Swallow whole. May take with food to decrease nausea and vomiting. 120 tablet 2  . ondansetron (ZOFRAN) 8 MG tablet TAKE 1 TABLET BY MOUTH EVERY 8 HOURS AS NEEDED FOR NAUSEA OR VOMITING (Patient taking differently: Take 8 mg by mouth every 8 (eight) hours as needed for nausea or vomiting.) 30 tablet 2  . oxyCODONE (OXY IR/ROXICODONE) 5 MG immediate release tablet Take 1 tablet (5 mg total) by mouth every 6 (six) hours as needed. for pain 30 tablet 0  . pantoprazole (PROTONIX) 40 MG tablet Take 1 tablet (40 mg total) by mouth daily. 30 tablet 5  . potassium chloride SA (KLOR-CON) 20 MEQ tablet Take 1 tablet (20 mEq total) by mouth 2 (two) times daily. 60 tablet 5  . prochlorperazine (COMPAZINE) 10 MG tablet Take 1 tablet (10 mg total) by mouth every 6 (six) hours as needed for nausea or vomiting. 30 tablet 2  . senna-docusate (SENOKOT-S) 8.6-50 MG tablet Take 1 tablet by mouth at bedtime.    . traMADol (ULTRAM) 50 MG tablet Take 1 tablet (50 mg total) by mouth every 6 (six) hours as needed. 40 tablet 0   No current facility-administered medications for this encounter.    Physical Findings: The patient is in no acute distress. Patient is alert and oriented.  height is 5' 6"  (1.676 m) and weight is 177 lb 9.6 oz (80.6 kg). Her temporal temperature is 97.7 F  (36.5 C). Her blood pressure is 137/81 and her pulse is 88. Her respiration is 20 and oxygen saturation is 98%. .    NAD; neurologically she has no focal deficits.  She is ambulatory independently.  She is alert and oriented and able to give a good history.  Lab Findings: Lab Results  Component Value Date   WBC 8.9 01/10/2020   HGB 10.3 (L) 01/10/2020   HCT 31.6 (L) 01/10/2020   MCV 96.3 01/10/2020   PLT 326 01/10/2020    Radiographic Findings: No results found.  Impression/Plan: She is doing well 1 month after radiosurgery to multiple lesions of the brain.  She will undergo another MRI in 2 months and follow-up with neurosurgery at that time.  She is pleased with this plan. We discussed measures to reduce the risk of  infection during the COVID-19 pandemic.  She is due for her booster shot.  She would like to receive that today.  We will arrange for her to receive it at the cancer center today.  On date of service, in total, I spent 20 minutes on this encounter. Patient was seen in person.  _____________________________________   Eppie Gibson, MD

## 2020-01-12 ENCOUNTER — Other Ambulatory Visit: Payer: Self-pay | Admitting: Physician Assistant

## 2020-01-12 DIAGNOSIS — C3492 Malignant neoplasm of unspecified part of left bronchus or lung: Secondary | ICD-10-CM

## 2020-01-12 DIAGNOSIS — R52 Pain, unspecified: Secondary | ICD-10-CM

## 2020-01-12 MED FILL — ONDANSETRON HCL 8 MG TABLET: 8 | 10 days supply | Qty: 30 | Fill #2

## 2020-01-13 ENCOUNTER — Other Ambulatory Visit (HOSPITAL_COMMUNITY): Payer: Self-pay | Admitting: Physician Assistant

## 2020-01-13 MED FILL — oxyCODONE HCL 5 MG TABS: 5 | 7 days supply | Qty: 30 | Fill #0

## 2020-01-16 ENCOUNTER — Other Ambulatory Visit: Payer: Self-pay | Admitting: *Deleted

## 2020-01-16 DIAGNOSIS — Z95828 Presence of other vascular implants and grafts: Secondary | ICD-10-CM

## 2020-01-18 ENCOUNTER — Telehealth: Payer: Self-pay | Admitting: Internal Medicine

## 2020-01-18 NOTE — Progress Notes (Signed)
  Patient Name: Nancy Moore MRN: [id number] DOB: 1965-05-30 Referring Physician: Lamar Blinks (Profile Not Attached) Date of Service: 12/09/2019 [facility],                                                         End Of Treatment Note  Diagnoses: C79.31-Secondary malignant neoplasm of brain  Cancer Staging:  Stage IV  Intent: Palliative  Radiation Treatment Dates: 12/09/2019 through 12/09/2019  Site Technique Total Dose (Gy) Dose per Fx (Gy) Completed Fx Beam Energies  Brain: Brain_SRS IMRT 20/20 20 1/1 6XFFF   ExacTrac, Max dose=129.3% 7 VMAT Beams PTV1LtCereb40m PTV2LtCereb38mPTV3LtCereb4m56mTV4RtCereb3mm68mV5MidCereb6mm 54m6LtCereb5mm P34mRtCereb4mm PT16mtCereb6mm PTV3mCereb3mm PTV142mPar6mm PTV1161mront3mm PTV12R39mmp3mm PTV13Lt79mnt3mm PTV14LtF74mt3mm  6X-FFF p17mons used;  ExacTrac Snap verification was performed for each couch angle.  Narrative: The patient tolerated radiation therapy relatively well.   Plan: The patient will follow-up with radiation oncology in 16mo.  --------73mo-----------------------  Froilan Mclean, MEppie Gibson

## 2020-01-18 NOTE — Telephone Encounter (Signed)
Rescheduled lab appointment and scheduled port flush appointments. Patient aware.

## 2020-01-19 ENCOUNTER — Inpatient Hospital Stay: Payer: Medicare Other

## 2020-01-23 ENCOUNTER — Other Ambulatory Visit: Payer: Self-pay | Admitting: *Deleted

## 2020-01-24 ENCOUNTER — Other Ambulatory Visit: Payer: Self-pay | Admitting: Radiation Therapy

## 2020-01-24 ENCOUNTER — Inpatient Hospital Stay: Payer: Medicare Other

## 2020-01-24 ENCOUNTER — Telehealth: Payer: Self-pay | Admitting: Medical Oncology

## 2020-01-24 ENCOUNTER — Other Ambulatory Visit: Payer: Self-pay | Admitting: Internal Medicine

## 2020-01-24 MED ORDER — HYDROCODONE-HOMATROPINE 5-1.5 MG/5ML PO SYRP: 5.0000 mL | ORAL_SOLUTION | Freq: Four times a day (QID) | ORAL | 0 refills | Status: DC | PRN

## 2020-01-24 NOTE — Telephone Encounter (Signed)
Requests antitussive refill.

## 2020-01-26 ENCOUNTER — Other Ambulatory Visit: Payer: Self-pay

## 2020-01-26 ENCOUNTER — Inpatient Hospital Stay: Payer: Medicare Other

## 2020-01-26 VITALS — BP 129/81 | HR 90 | Temp 98.9°F | Resp 18

## 2020-01-26 DIAGNOSIS — Z23 Encounter for immunization: Secondary | ICD-10-CM | POA: Diagnosis not present

## 2020-01-26 DIAGNOSIS — Z79899 Other long term (current) drug therapy: Secondary | ICD-10-CM | POA: Diagnosis not present

## 2020-01-26 DIAGNOSIS — R42 Dizziness and giddiness: Secondary | ICD-10-CM | POA: Diagnosis not present

## 2020-01-26 DIAGNOSIS — C7931 Secondary malignant neoplasm of brain: Secondary | ICD-10-CM | POA: Diagnosis not present

## 2020-01-26 DIAGNOSIS — C3492 Malignant neoplasm of unspecified part of left bronchus or lung: Secondary | ICD-10-CM | POA: Diagnosis not present

## 2020-01-26 DIAGNOSIS — M549 Dorsalgia, unspecified: Secondary | ICD-10-CM | POA: Diagnosis not present

## 2020-01-26 DIAGNOSIS — I10 Essential (primary) hypertension: Secondary | ICD-10-CM | POA: Diagnosis not present

## 2020-01-26 DIAGNOSIS — R110 Nausea: Secondary | ICD-10-CM | POA: Diagnosis not present

## 2020-01-26 MED ORDER — INFLUENZA VAC SPLIT QUAD 0.5 ML IM SUSY
0.5000 mL | PREFILLED_SYRINGE | Freq: Once | INTRAMUSCULAR | Status: AC
  Administered 2020-01-26: 0.5 mL via INTRAMUSCULAR

## 2020-01-26 MED ORDER — INFLUENZA VAC SPLIT QUAD 0.5 ML IM SUSY
PREFILLED_SYRINGE | INTRAMUSCULAR | Status: AC
  Filled 2020-01-26: qty 0.5

## 2020-01-26 NOTE — Patient Instructions (Signed)
Influenza (Flu) Vaccine (Inactivated or Recombinant): What You Need to Know 1. Why get vaccinated? Influenza vaccine can prevent influenza (flu). Flu is a contagious disease that spreads around the [place] every year, usually between October and May. Anyone can get the flu, but it is more dangerous for some people. Infants and young children, people 53 years of age and older, pregnant women, and people with certain health conditions or a weakened immune system are at greatest risk of flu complications. Pneumonia, bronchitis, sinus infections and ear infections are examples of flu-related complications. If you have a medical condition, such as heart disease, cancer or diabetes, flu can make it worse. Flu can cause fever and chills, sore throat, muscle aches, fatigue, cough, headache, and runny or stuffy nose. Some people may have vomiting and diarrhea, though this is more common in children than adults. Each year thousands of people in the [place] die from flu, and many more are hospitalized. Flu vaccine prevents millions of illnesses and flu-related visits to the doctor each year. 2. Influenza vaccine CDC recommends everyone 31 months of age and older get vaccinated every flu season. Children 6 months through 34 years of age may need 2 doses during a single flu season. Everyone else needs only 1 dose each flu season. It takes about 2 weeks for protection to develop after vaccination. There are many flu viruses, and they are always changing. Each year a new flu vaccine is made to protect against three or four viruses that are likely to cause disease in the upcoming flu season. Even when the vaccine doesn't exactly match these viruses, it may still provide some protection. Influenza vaccine does not cause flu. Influenza vaccine may be given at the same time as other vaccines. 3. Talk with your health care provider Tell your vaccine provider if the person getting the vaccine:  Has had an  allergic reaction after a previous dose of influenza vaccine, or has any severe, life-threatening allergies.  Has ever had Guillain-Barr Syndrome (also called GBS). In some cases, your health care provider may decide to postpone influenza vaccination to a future visit. People with minor illnesses, such as a cold, may be vaccinated. People who are moderately or severely ill should usually wait until they recover before getting influenza vaccine. Your health care provider can give you more information. 4. Risks of a vaccine reaction  Soreness, redness, and swelling where shot is given, fever, muscle aches, and headache can happen after influenza vaccine.  There may be a very small increased risk of Guillain-Barr Syndrome (GBS) after inactivated influenza vaccine (the flu shot). Young children who get the flu shot along with pneumococcal vaccine (PCV13), and/or DTaP vaccine at the same time might be slightly more likely to have a seizure caused by fever. Tell your health care provider if a child who is getting flu vaccine has ever had a seizure. People sometimes faint after medical procedures, including vaccination. Tell your provider if you feel dizzy or have vision changes or ringing in the ears. As with any medicine, there is a very remote chance of a vaccine causing a severe allergic reaction, other serious injury, or death. 5. What if there is a serious problem? An allergic reaction could occur after the vaccinated person leaves the clinic. If you see signs of a severe allergic reaction (hives, swelling of the face and throat, difficulty breathing, a fast heartbeat, dizziness, or weakness), call 9-1-1 and get the person to the nearest hospital. For other signs that  concern you, call your health care provider. Adverse reactions should be reported to the Vaccine Adverse Event Reporting System (VAERS). Your health care provider will usually file this report, or you can do it yourself. Visit the  VAERS website at www.vaers.SamedayNews.es or call 540-722-2410.VAERS is only for reporting reactions, and VAERS staff do not give medical advice. 6. The National Vaccine Injury Compensation Program The [employer] Vaccine Injury Compensation Program (VICP) is a federal program that was created to compensate people who may have been injured by certain vaccines. Visit the VICP website at GoldCloset.com.ee or call 8470469371 to learn about the program and about filing a claim. There is a time limit to file a claim for compensation. 7. How can I learn more?  Ask your healthcare provider.  Call your local or state health department.  Contact the Centers for Disease Control and Prevention (CDC): ? Call 579-678-1809 (1-800-CDC-INFO) or ? Visit CDC's https://gibson.com/ Vaccine Information Statement (Interim) Inactivated Influenza Vaccine (09/10/2017) This information is not intended to replace advice given to you by your health care provider. Make sure you discuss any questions you have with your health care provider. Document Revised: 05/04/2018 Document Reviewed: 09/14/2017 Elsevier Patient Education  [place].

## 2020-02-01 ENCOUNTER — Telehealth: Payer: Self-pay

## 2020-02-01 NOTE — Telephone Encounter (Signed)
Oral Oncology Patient Advocate Encounter  Patient has been approved for copay assistance with [employer] (TAF).  [employer] will cover all copayment expenses for Nancy Moore for the remainder of the calendar year.    The billing information is as follows and has been shared with [facility].   Member ID: [id number] Group ID: [id number] PCN: [id number] BIN: [id number] Eligibility Dates: 01/28/20 to 01/26/21  Fund: [employer] [facility] [place] Phone [phone] Fax [phone] 02/01/2020 4:32 PM

## 2020-02-03 ENCOUNTER — Other Ambulatory Visit: Payer: Medicare Other

## 2020-02-06 ENCOUNTER — Inpatient Hospital Stay: Admission: RE | Admit: 2020-02-06 | Payer: Medicare Other | Source: Ambulatory Visit

## 2020-02-07 ENCOUNTER — Ambulatory Visit (HOSPITAL_COMMUNITY)
Admission: RE | Admit: 2020-02-07 | Discharge: 2020-02-07 | Disposition: A | Discharge status: Home / Self Care | Payer: Medicare Other | Source: Ambulatory Visit | Attending: Internal Medicine | Admitting: Internal Medicine

## 2020-02-07 ENCOUNTER — Inpatient Hospital Stay: Payer: Medicare Other | Attending: Oncology

## 2020-02-07 ENCOUNTER — Encounter (HOSPITAL_COMMUNITY): Payer: Self-pay

## 2020-02-07 ENCOUNTER — Inpatient Hospital Stay: Payer: Medicare Other

## 2020-02-07 ENCOUNTER — Other Ambulatory Visit: Payer: Self-pay

## 2020-02-07 DIAGNOSIS — C3490 Malignant neoplasm of unspecified part of unspecified bronchus or lung: Secondary | ICD-10-CM

## 2020-02-07 DIAGNOSIS — G9520 Unspecified cord compression: Secondary | ICD-10-CM | POA: Diagnosis not present

## 2020-02-07 DIAGNOSIS — Z452 Encounter for adjustment and management of vascular access device: Secondary | ICD-10-CM | POA: Insufficient documentation

## 2020-02-07 DIAGNOSIS — R499 Unspecified voice and resonance disorder: Secondary | ICD-10-CM | POA: Diagnosis not present

## 2020-02-07 DIAGNOSIS — I700 Atherosclerosis of aorta: Secondary | ICD-10-CM | POA: Diagnosis not present

## 2020-02-07 DIAGNOSIS — I313 Pericardial effusion (noninflammatory): Secondary | ICD-10-CM | POA: Diagnosis not present

## 2020-02-07 DIAGNOSIS — Z95828 Presence of other vascular implants and grafts: Secondary | ICD-10-CM

## 2020-02-07 DIAGNOSIS — C7931 Secondary malignant neoplasm of brain: Secondary | ICD-10-CM | POA: Diagnosis not present

## 2020-02-07 DIAGNOSIS — C7951 Secondary malignant neoplasm of bone: Secondary | ICD-10-CM | POA: Diagnosis not present

## 2020-02-07 LAB — CBC WITH DIFFERENTIAL (CANCER CENTER ONLY)
Abs Immature Granulocytes: 0.02 10*3/uL (ref 0.00–0.07)
Basophils Absolute: 0.1 10*3/uL (ref 0.0–0.1)
Basophils Relative: 1 %
Eosinophils Absolute: 0.1 10*3/uL (ref 0.0–0.5)
Eosinophils Relative: 1 %
HCT: 29.4 % — ABNORMAL LOW (ref 36.0–46.0)
Hemoglobin: 9.5 g/dL — ABNORMAL LOW (ref 12.0–15.0)
Immature Granulocytes: 0 %
Lymphocytes Relative: 16 %
Lymphs Abs: 1.3 10*3/uL (ref 0.7–4.0)
MCH: 32.4 pg (ref 26.0–34.0)
MCHC: 32.3 g/dL (ref 30.0–36.0)
MCV: 100.3 fL — ABNORMAL HIGH (ref 80.0–100.0)
Monocytes Absolute: 1.2 10*3/uL — ABNORMAL HIGH (ref 0.1–1.0)
Monocytes Relative: 15 %
Neutro Abs: 5.3 10*3/uL (ref 1.7–7.7)
Neutrophils Relative %: 67 %
Platelet Count: 192 10*3/uL (ref 150–400)
RBC: 2.93 MIL/uL — ABNORMAL LOW (ref 3.87–5.11)
RDW: 22.2 % — ABNORMAL HIGH (ref 11.5–15.5)
WBC Count: 8 10*3/uL (ref 4.0–10.5)
nRBC: 0.5 % — ABNORMAL HIGH (ref 0.0–0.2)

## 2020-02-07 LAB — CMP (CANCER CENTER ONLY)
ALT: <6 U/L (ref 0–44)
AST: 16 U/L (ref 15–41)
Albumin: 3.1 g/dL — ABNORMAL LOW (ref 3.5–5.0)
Alkaline Phosphatase: 82 U/L (ref 38–126)
Anion gap: 12 (ref 5–15)
BUN: 8 mg/dL (ref 6–20)
CO2: 28 mmol/L (ref 22–32)
Calcium: 10.4 mg/dL — ABNORMAL HIGH (ref 8.9–10.3)
Chloride: 96 mmol/L — ABNORMAL LOW (ref 98–111)
Creatinine: 1.09 mg/dL — ABNORMAL HIGH (ref 0.44–1.00)
GFR, Estimated: >60 mL/min (ref >60)
Glucose, Bld: 97 mg/dL (ref 70–99)
Potassium: 3.7 mmol/L (ref 3.5–5.1)
Sodium: 136 mmol/L (ref 135–145)
Total Bilirubin: 0.7 mg/dL (ref 0.3–1.2)
Total Protein: 7.7 g/dL (ref 6.5–8.1)

## 2020-02-07 MED ORDER — HEPARIN SOD (PORK) LOCK FLUSH 100 UNIT/ML IV SOLN
INTRAVENOUS | Status: AC
  Filled 2020-02-07: qty 5

## 2020-02-07 MED ORDER — IOHEXOL 300 MG/ML  SOLN
100.0000 mL | Freq: Once | INTRAMUSCULAR | Status: AC | PRN
  Administered 2020-02-07: 100 mL via INTRAVENOUS

## 2020-02-07 MED ORDER — SODIUM CHLORIDE 0.9% FLUSH
10.0000 mL | INTRAVENOUS | Status: DC | PRN
  Administered 2020-02-07: 10 mL
  Filled 2020-02-07: qty 10

## 2020-02-07 MED ORDER — HEPARIN SOD (PORK) LOCK FLUSH 100 UNIT/ML IV SOLN: 500.0000 [IU] | Freq: Once | INTRAVENOUS | Status: DC

## 2020-02-07 NOTE — Progress Notes (Signed)
Patient left accessed for CT. Reminded patient to make sure that they deaccess her needle before leaving. Patient verbalized understanding.

## 2020-02-08 ENCOUNTER — Encounter: Payer: Self-pay | Admitting: Family Medicine

## 2020-02-10 ENCOUNTER — Ambulatory Visit (HOSPITAL_COMMUNITY)
Admission: RE | Admit: 2020-02-10 | Discharge: 2020-02-10 | Disposition: A | Discharge status: Home / Self Care | Payer: Medicare Other | Source: Ambulatory Visit | Attending: Internal Medicine | Admitting: Internal Medicine

## 2020-02-10 ENCOUNTER — Inpatient Hospital Stay: Payer: Medicare Other

## 2020-02-10 ENCOUNTER — Other Ambulatory Visit: Payer: Self-pay

## 2020-02-10 DIAGNOSIS — C3490 Malignant neoplasm of unspecified part of unspecified bronchus or lung: Secondary | ICD-10-CM | POA: Insufficient documentation

## 2020-02-10 DIAGNOSIS — C7931 Secondary malignant neoplasm of brain: Secondary | ICD-10-CM | POA: Diagnosis not present

## 2020-02-10 DIAGNOSIS — C7951 Secondary malignant neoplasm of bone: Secondary | ICD-10-CM | POA: Diagnosis not present

## 2020-02-10 DIAGNOSIS — Z95828 Presence of other vascular implants and grafts: Secondary | ICD-10-CM

## 2020-02-10 DIAGNOSIS — Z452 Encounter for adjustment and management of vascular access device: Secondary | ICD-10-CM | POA: Diagnosis not present

## 2020-02-10 DIAGNOSIS — I619 Nontraumatic intracerebral hemorrhage, unspecified: Secondary | ICD-10-CM | POA: Diagnosis not present

## 2020-02-10 DIAGNOSIS — G9389 Other specified disorders of brain: Secondary | ICD-10-CM | POA: Diagnosis not present

## 2020-02-10 DIAGNOSIS — J341 Cyst and mucocele of nose and nasal sinus: Secondary | ICD-10-CM | POA: Diagnosis not present

## 2020-02-10 MED ORDER — GADOBUTROL 1 MMOL/ML IV SOLN
8.0000 mL | Freq: Once | INTRAVENOUS | Status: AC | PRN
  Administered 2020-02-10: 8 mL via INTRAVENOUS

## 2020-02-10 MED ORDER — SODIUM CHLORIDE 0.9% FLUSH
10.0000 mL | INTRAVENOUS | Status: DC | PRN
  Administered 2020-02-10: 10 mL
  Filled 2020-02-10: qty 10

## 2020-02-10 NOTE — Patient Instructions (Signed)
Implanted [facility] Guide An implanted port is a device that is placed under the skin. It is usually placed in the chest. The device can be used to give IV medicine, to take blood, or for dialysis. You may have an implanted port if:  You need IV medicine that would be irritating to the small veins in your hands or arms.  You need IV medicines, such as antibiotics, for a long period of time.  You need IV nutrition for a long period of time.  You need dialysis. When you have a port, your health care provider can choose to use the port instead of veins in your arms for these procedures. You may have fewer limitations when using a port than you would if you used other types of long-term IVs, and you will likely be able to return to normal activities after your incision heals. An implanted port has two main parts:  Reservoir. The reservoir is the part where a needle is inserted to give medicines or draw blood. The reservoir is round. After it is placed, it appears as a small, raised area under your skin.  Catheter. The catheter is a thin, flexible tube that connects the reservoir to a vein. Medicine that is inserted into the reservoir goes into the catheter and then into the vein. How is my port accessed? To access your port:  A numbing cream may be placed on the skin over the port site.  Your health care provider will put on a mask and sterile gloves.  The skin over your port will be cleaned carefully with a germ-killing soap and allowed to dry.  Your health care provider will gently pinch the port and insert a needle into it.  Your health care provider will check for a blood return to make sure the port is in the vein and is not clogged.  If your port needs to remain accessed to get medicine continuously (constant infusion), your health care provider will place a clear bandage (dressing) over the needle site. The dressing and needle will need to be changed every week, or as told by your  health care provider. What is flushing? Flushing helps keep the port from getting clogged. Follow instructions from your health care provider about how and when to flush the port. Ports are usually flushed with saline solution or a medicine called heparin. The need for flushing will depend on how the port is used:  If the port is only used from time to time to give medicines or draw blood, the port may need to be flushed: ? Before and after medicines have been given. ? Before and after blood has been drawn. ? As part of routine maintenance. Flushing may be recommended every 4-6 weeks.  If a constant infusion is running, the port may not need to be flushed.  Throw away any syringes in a disposal container that is meant for sharp items (sharps container). You can buy a sharps container from a pharmacy, or you can make one by using an empty hard plastic bottle with a cover. How long will my port stay implanted? The port can stay in for as long as your health care provider thinks it is needed. When it is time for the port to come out, a surgery will be done to remove it. The surgery will be similar to the procedure that was done to put the port in. Follow these instructions at home:  Flush your port as told by your health care  provider.  If you need an infusion over several days, follow instructions from your health care provider about how to take care of your port site. Make sure you: ? Wash your hands with soap and water before you change your dressing. If soap and water are not available, use alcohol-based hand sanitizer. ? Change your dressing as told by your health care provider. ? Place any used dressings or infusion bags into a plastic bag. Throw that bag in the trash. ? Keep the dressing that covers the needle clean and dry. Do not get it wet. ? Do not use scissors or sharp objects near the tube. ? Keep the tube clamped, unless it is being used.  Check your port site every day for signs  of infection. Check for: ? Redness, swelling, or pain. ? Fluid or blood. ? Pus or a bad smell.  Protect the skin around the port site. ? Avoid wearing bra straps that rub or irritate the site. ? Protect the skin around your port from seat belts. Place a soft pad over your chest if needed.  Bathe or shower as told by your health care provider. The site may get wet as long as you are not actively receiving an infusion.  Return to your normal activities as told by your health care provider. Ask your health care provider what activities are safe for you.  Carry a medical alert card or wear a medical alert bracelet at all times. This will let health care providers know that you have an implanted port in case of an emergency.   Get help right away if:  You have redness, swelling, or pain at the port site.  You have fluid or blood coming from your port site.  You have pus or a bad smell coming from the port site.  You have a fever. Summary  Implanted ports are usually placed in the chest for long-term IV access.  Follow instructions from your health care provider about flushing the port and changing bandages (dressings).  Take care of the area around your port by avoiding clothing that puts pressure on the area, and by watching for signs of infection.  Protect the skin around your port from seat belts. Place a soft pad over your chest if needed.  Get help right away if you have a fever or you have redness, swelling, pain, drainage, or a bad smell at the port site. This information is not intended to replace advice given to you by your health care provider. Make sure you discuss any questions you have with your health care provider. Document Revised: 05/30/2019 Document Reviewed: 05/30/2019 Elsevier Patient Education  [place].

## 2020-02-10 NOTE — Progress Notes (Signed)
Patient left accessed with power port for MRI

## 2020-02-13 ENCOUNTER — Other Ambulatory Visit: Payer: Self-pay

## 2020-02-13 ENCOUNTER — Inpatient Hospital Stay: Payer: Medicare Other | Admitting: Internal Medicine

## 2020-02-13 ENCOUNTER — Inpatient Hospital Stay (HOSPITAL_BASED_OUTPATIENT_CLINIC_OR_DEPARTMENT_OTHER): Payer: Medicare Other | Admitting: Internal Medicine

## 2020-02-13 ENCOUNTER — Other Ambulatory Visit (HOSPITAL_COMMUNITY): Payer: Self-pay | Admitting: Internal Medicine

## 2020-02-13 DIAGNOSIS — I10 Essential (primary) hypertension: Secondary | ICD-10-CM | POA: Diagnosis not present

## 2020-02-13 DIAGNOSIS — C3490 Malignant neoplasm of unspecified part of unspecified bronchus or lung: Secondary | ICD-10-CM | POA: Diagnosis not present

## 2020-02-13 DIAGNOSIS — C7931 Secondary malignant neoplasm of brain: Secondary | ICD-10-CM

## 2020-02-13 DIAGNOSIS — Z5111 Encounter for antineoplastic chemotherapy: Secondary | ICD-10-CM

## 2020-02-13 DIAGNOSIS — C3492 Malignant neoplasm of unspecified part of left bronchus or lung: Secondary | ICD-10-CM | POA: Diagnosis not present

## 2020-02-13 MED ORDER — FENTANYL 50 MCG/HR TD PT72: 1.0000 | MEDICATED_PATCH | TRANSDERMAL | 0 refills | Status: AC

## 2020-02-13 MED ORDER — HYDROCODONE-HOMATROPINE 5-1.5 MG/5ML PO SYRP: 5.0000 mL | ORAL_SOLUTION | Freq: Four times a day (QID) | ORAL | 0 refills | Status: AC | PRN

## 2020-02-13 MED FILL — HYDROCODONE-HOMATROPINE SYR: 5-1.5 | 6 days supply | Qty: 120 | Fill #0

## 2020-02-13 MED FILL — fentaNYL 50 MCG/HR PT72: 50 | 30 days supply | Qty: 10 | Fill #0

## 2020-02-13 NOTE — Progress Notes (Signed)
[place] [phone]   [phone]) [phone]  PROGRESS NOTE FOR TELEMEDICINE VISITS  Copland, Gay Filler, MD [address] [address]  I connected with@ on 02/13/20 at  2:00 PM EST by video enabled telemedicine visit and verified that I am speaking with the correct person using two identifiers.   I discussed the limitations, risks, security and privacy concerns of performing an evaluation and management service by telemedicine and the availability of in-person appointments. I also discussed with the patient that there may be a patient responsible charge related to this service. The patient expressed understanding and agreed to proceed.  Other persons participating in the visit and their role in the encounter: Sister Doylene Bode  Patient's location: Hotel Provider's location: [place]   DIAGNOSIS: DIAGNOSIS:stage IV (T3, N2, M1 a) non-small cell lung cancer, poorly differentiated adenocarcinoma with extensive miliary distribution in the lungs bilaterally diagnosed in September 2018. 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 NEGATIVE for the Exon 20 T790M mutation. Repeat molecular studies by guardant 360 at time of progression showed ATM [id number] [facility], V600E but this was not detected on the molecular studies repeated after completion of her chemotherapy.  PRIOR THERAPY: 1) Gilotrif 40 mg daily started 11/08/2016.Status post54monthof treatment. 2) Tagrisso 80 mg p.o. daily.  First dose started January 01, 2018.  Status post 54 months of treatment. 3) Systemic chemotherapy with carboplatin for AUC 5, Alimta 500 mg/M2 and Avastin 15 mg/KG every 3 weeks.  First dose February 10, 2018.  Status post 54 cycles.  Starting from cycle #7 she will be on maintenance treatment with Alimta and Avastin in addition to her  treatment with Tagrisso.  This was discontinued for second disease progression. 4) SBRT to metastatic brain lesions under the care of Dr. SqIsidore Moos CURRENT THERAPY: Olaparib 300 mg p.o. twice daily.  She started the first dose on December 08, 2019.   INTERVAL HISTORY: Nancy Moore 55 female has a MyChart video virtual visit with me today for evaluation and discussion of her scan results and treatment options.  The patient is currently in a hotel because of the weather condition and she was worried about staying home with loss of power.  She continues to complain of increasing fatigue and weakness as well as left-sided chest pain.  She is currently on fentanyl patch 50 mcg/hour every 3 days as well as oxycodone for breakthrough pain.  The patient denied having any current nausea but has occasional vomiting with no diarrhea or constipation and no abdominal pain.  She denied having any headache or visual changes.  She lost few pounds since her last visit.  She has been on tolerating with olaparib and tolerating it well.  She had repeat CT scan of the chest, abdomen pelvis as well as MRI of the brain performed recently and we are having the visit for evaluation and discussion of her scan results and treatment options.  MEDICAL HISTORY: Past Medical History:  Diagnosis Date  . Aden[place] of [place] lung, stage 4 ([place]/02[place]  . Anemia   . Arthritis   . Constipation   . Crohn's disease ([facility]   in remission 35 years +  . Dyspnea    gets "winded"  . GERD (gastroesophageal reflux disease)   . Goals of care, counseling/discussion 10/28/2016  . History of hiatal hernia  noted on CT 12/20  . Hypertension   . Pneumonia    walking pneumonia in the late 80's  . Recurrent pleural effusion on left     ALLERGIES:  is allergic to percocet [oxycodone-acetaminophen], lisinopril, and losartan.  MEDICATIONS:  Current Outpatient Medications  Medication Sig Dispense Refill  . albuterol  (VENTOLIN HFA) 108 (90 Base) MCG/ACT inhaler Inhale 2 puffs into the lungs every 6 (six) hours as needed for wheezing or shortness of breath. 8 g 1  . clindamycin (CLINDAGEL) 1 % gel Apply 1 application topically 2 (two) times daily. 30 g 1  . dexamethasone (DECADRON) 4 MG tablet 1 tablet p.o. twice daily the day before, day of and day after chemotherapy every 3 weeks 20 tablet 2  . doxycycline (VIBRA-TABS) 100 MG tablet Take 1 tablet (100 mg total) by mouth 2 (two) times daily. 20 tablet 0  . fentaNYL (DURAGESIC) 50 MCG/HR Place 1 patch onto the skin every 3 (three) days. 10 patch 0  . fluticasone (FLONASE) 50 MCG/ACT nasal spray Place 2 sprays into both nostrils daily as needed for allergies. 16 g 3  . folic acid (FOLVITE) 1 MG tablet TAKE 1 TABLET BY MOUTH ONCE A DAY 30 tablet 4  . furosemide (LASIX) 40 MG tablet TAKE 1 TABLET BY MOUTH ONCE DAILY 30 tablet 3  . guaiFENesin (MUCINEX) 600 MG 12 hr tablet Take 600 mg by mouth 2 (two) times daily as needed for to loosen phlegm.     Marland Kitchen HYDROcodone-homatropine (HYCODAN) 5-1.5 MG/5ML syrup Take 5 mLs by mouth every 6 (six) hours as needed for cough. 120 mL 0  . lidocaine-prilocaine (EMLA) cream APPLY TO THE AFFECTED AREA(S) AS NEEDED 30 g 0  . linaclotide (LINZESS) 145 MCG CAPS capsule Take 1 capsule (145 mcg total) by mouth daily before breakfast. 30 capsule 6  . loperamide (IMODIUM) 1 MG/5ML solution Take 10 mLs (2 mg total) by mouth as needed for diarrhea or loose stools. (Patient taking differently: Take 2 mg by mouth daily as needed for diarrhea or loose stools.) 120 mL 0  . loratadine (CLARITIN) 10 MG tablet Take 10 mg by mouth daily.    Marland Kitchen LORazepam (ATIVAN) 1 MG tablet Take 1 tablet PO 30 min before radiation therapy and/or MRIs. 3 tablet 0  . magnesium oxide (MAG-OX) 400 (241.3 Mg) MG tablet Take 1 tablet (400 mg total) by mouth daily.    . methocarbamol (ROBAXIN) 500 MG tablet TAKE 1 TABLET (500 MG TOTAL) BY MOUTH EVERY 8 (EIGHT) HOURS AS NEEDED  FOR MUSCLE SPASMS. 30 tablet 5  . methylPREDNISolone (MEDROL DOSEPAK) 4 MG TBPK tablet Use as instructed.  Start Monday, January 16, 2020. 21 tablet 0  . nystatin cream (MYCOSTATIN) APPLY 1 APPLICATION TOPICALLY 2 (TWO) TIMES DAILY. (Patient taking differently: Apply 1 application topically daily as needed (skin irritaion).) 30 g 0  . olaparib (LYNPARZA) 150 MG tablet Take 2 tablets (300 mg total) by mouth 2 (two) times daily. Swallow whole. May take with food to decrease nausea and vomiting. 120 tablet 2  . ondansetron (ZOFRAN) 8 MG tablet TAKE 1 TABLET BY MOUTH EVERY 8 HOURS AS NEEDED FOR NAUSEA OR VOMITING (Patient taking differently: Take 8 mg by mouth every 8 (eight) hours as needed for nausea or vomiting.) 30 tablet 2  . oxyCODONE (OXY IR/ROXICODONE) 5 MG immediate release tablet TAKE 1 TABLET BY MOUTH EVERY 6 HOURS AS NEEDED FOR PAIN 30 tablet 0  . pantoprazole (PROTONIX) 40 MG tablet Take 1 tablet (  40 mg total) by mouth daily. 30 tablet 5  . potassium chloride SA (KLOR-CON) 20 MEQ tablet Take 1 tablet (20 mEq total) by mouth 2 (two) times daily. 60 tablet 5  . prochlorperazine (COMPAZINE) 10 MG tablet Take 1 tablet (10 mg total) by mouth every 6 (six) hours as needed for nausea or vomiting. 30 tablet 2  . senna-docusate (SENOKOT-S) 8.6-50 MG tablet Take 1 tablet by mouth at bedtime.    . traMADol (ULTRAM) 50 MG tablet Take 1 tablet (50 mg total) by mouth every 6 (six) hours as needed. 40 tablet 0   No current facility-administered medications for this visit.    SURGICAL HISTORY:  Past Surgical History:  Procedure Laterality Date  . ABDOMINAL HYSTERECTOMY     partial hysterectomy  . BREAST EXCISIONAL BIOPSY Left 20+ yrs ago   benign  . CHEST TUBE INSERTION Left 10/26/2018   Procedure: INSERTION PLEURAL DRAINAGE CATHETER;  Surgeon: Ivin Poot, MD;  Location: [place];  Service: Thoracic;  Laterality: Left;  . COLONOSCOPY    . IR IMAGING GUIDED PORT INSERTION  07/04/2019  .  PERICARDIAL WINDOW N/A 01/10/2017   Procedure: PERICARDIAL WINDOW- SUB XYPHOID, RIGHT CHEST TUBE;  Surgeon: Ivin Poot, MD;  Location: [place];  Service: Thoracic;  Laterality: N/A;  . PORT-A-CATH REMOVAL Left 05/10/2019   Procedure: REMOVAL PORT-A-CATH;  Surgeon: Ivin Poot, MD;  Location: [place];  Service: Thoracic;  Laterality: Left;  . PORTACATH PLACEMENT Left 02/21/2019   Procedure: INSERTION PORT-A-CATH;  Surgeon: Ivin Poot, MD;  Location: [place];  Service: Thoracic;  Laterality: Left;  . REMOVAL OF PLEURAL DRAINAGE CATHETER Left 02/21/2019   Procedure: REMOVAL OF PLEURAL DRAINAGE CATHETER;  Surgeon: Ivin Poot, MD;  Location: [place];  Service: Thoracic;  Laterality: Left;  Marland Kitchen VIDEO BRONCHOSCOPY Bilateral 10/21/2016   Procedure: VIDEO BRONCHOSCOPY WITH FLUORO;  Surgeon: Tanda Rockers, MD;  Location: [facility];  Service: Cardiopulmonary;  Laterality: Bilateral;    REVIEW OF SYSTEMS:  Constitutional: positive for anorexia, fatigue and weight loss Eyes: negative Ears, nose, mouth, throat, and face: negative Respiratory: positive for cough, dyspnea on exertion and pleurisy/chest pain Cardiovascular: negative Gastrointestinal: positive for vomiting Genitourinary:negative Integument/breast: negative Hematologic/lymphatic: negative Musculoskeletal:negative Neurological: negative Behavioral/Psych: negative Endocrine: negative Allergic/Immunologic: negative    LABORATORY DATA: Lab Results  Component Value Date   WBC 8.0 02/07/2020   HGB 9.5 (L) 02/07/2020   HCT 29.4 (L) 02/07/2020   MCV 100.3 (H) 02/07/2020   PLT 192 02/07/2020      Chemistry      Component Value Date/Time   NA 136 02/07/2020 1347   NA 135 (L) 01/08/2017 1055   K 3.7 02/07/2020 1347   K 3.2 (L) 01/08/2017 1055   CL 96 (L) 02/07/2020 1347   CO2 28 02/07/2020 1347   CO2 25 01/08/2017 1055   BUN 8 02/07/2020 1347   BUN 14.6 01/08/2017 1055   CREATININE 1.09 (H) 02/07/2020 1347    CREATININE 1.3 (H) 01/08/2017 1055      Component Value Date/Time   CALCIUM 10.4 (H) 02/07/2020 1347   CALCIUM 9.5 01/08/2017 1055   ALKPHOS 82 02/07/2020 1347   ALKPHOS 82 01/08/2017 1055   AST 16 02/07/2020 1347   AST 31 01/08/2017 1055   ALT <6 02/07/2020 1347   ALT 34 01/08/2017 1055   BILITOT 0.7 02/07/2020 1347   BILITOT 1.74 (H) 01/08/2017 1055       RADIOGRAPHIC STUDIES: CT Soft Tissue Neck W  Contrast  Result Date: 02/07/2020 CLINICAL DATA:  Metastatic lung cancer EXAM: CT NECK WITH CONTRAST TECHNIQUE: Multidetector CT imaging of the neck was performed using the standard protocol following the bolus administration of intravenous contrast. CONTRAST:  17m OMNIPAQUE IOHEXOL 300 MG/ML  SOLN COMPARISON:  Correlation made with prior CT chest and MRI brain FINDINGS: Motion and streak artifact are present on several slices. Pharynx and larynx: Unremarkable.  No mass or swelling. Salivary glands: Unremarkable. Thyroid: Normal. Lymph nodes: No enlarged lymph nodes. Vascular: Major neck vessels are patent. Limited intracranial: Punctate foci of enhancement in the left cerebellum may correspond to known metastases. Visualized orbits: Not included. Mastoids and visualized paranasal sinuses: Minor mucosal thickening. Visualized mastoid air cells are clear. Skeleton: Mild degenerative changes. No aggressive osseous lesion. An area of lucency within the T2 vertebral body is not definitely present on prior imaging (series 7, image 54). Upper chest: Dictated separately. Other: None. IMPRESSION: No neck mass or adenopathy. Small area of lucency within the T2 vertebral body appears new compared to October chest CT and may reflect a metastatic lesion. Electronically Signed   By: PMacy MisM.D.   On: 02/07/2020 16:39   CT Chest W Contrast  Result Date: 02/07/2020 CLINICAL DATA:  Restaging metastatic lung cancer (poorly differentiated adenocarcinoma). EXAM: CT CHEST, ABDOMEN, AND PELVIS WITH CONTRAST  TECHNIQUE: Multidetector CT imaging of the chest, abdomen and pelvis was performed following the standard protocol during bolus administration of intravenous contrast. CONTRAST:  1065mOMNIPAQUE IOHEXOL 300 MG/ML  SOLN COMPARISON:  Prior CTs 11/16/2019 and 08/22/2019. FINDINGS: CT CHEST FINDINGS Neck findings are dictated separately. Cardiovascular: Right IJ Port-A-Cath extends to the mid right atrial level. No acute vascular findings are seen. There is mild extrinsic compression of the pulmonary arteries centrally by the enlarging central mediastinal process. There is mild aortic atherosclerosis. The heart size is stable. There is a stable small pericardial effusion. Mediastinum/Nodes: There is progressive ill-defined confluent soft tissue in the AP window no suspicious for progressive adenopathy. This measures up to 5.0 x 3.3 cm on image 21/2. Some of this may reflect complex fluid in the superior pericardial recess, although the fluid more inferiorly around the heart appears relatively simple. The thyroid gland, trachea and esophagus demonstrate no significant findings. Lungs/Pleura: Stable trace left pleural effusion. No right pleural effusion or pneumothorax. There are innumerable pulmonary nodules bilaterally with further enlargement. Representative nodules include a right lower lobe nodule measuring 3.1 x 2.6 cm on image 86/7 (previously 1.6 cm maximally) and a 2.2 x 1.4 cm left lower lobe nodule on image 67/7 (previously 1.0 cm maximally). Chronic opacity medially at the left lung base is similar to the prior study, likely due to chronic atelectasis and/or scarring. Patchy peribronchovascular opacities in both lungs are not significantly changed, likely treatment related. Musculoskeletal/Chest wall: There is a new lytic metastasis involving the right 6th rib posteriorly with an associated pathologic fracture (image 40/7). There are new lytic lesions within the spine, most prominent within the T9 vertebral  body (image 78/6). This lesion measures up to 1.7 cm on axial image 36/2 and is associated with a small anterior epidural component, not expected to cause cord compression at this time. There are probable additional smaller lesions at T1 and T2. CT ABDOMEN AND PELVIS FINDINGS Hepatobiliary: New clustered low-density lesions posteriorly in the right hepatic lobe measuring up to 1.2 cm on image 46/2, suspicious for metastatic disease. No evidence of gallstones, gallbladder wall thickening or biliary dilatation. Pancreas: Unremarkable. No pancreatic  ductal dilatation or surrounding inflammatory changes. Spleen: Normal in size without focal abnormality. Adrenals/Urinary Tract: Both adrenal glands appear normal. The kidneys appear normal without evidence of urinary tract calculus, suspicious lesion or hydronephrosis. No bladder abnormalities are seen. Stomach/Bowel: The stomach appears unremarkable for its degree of distension. No evidence of bowel wall thickening, distention or surrounding inflammatory change. Moderate stool throughout the colon. Vascular/Lymphatic: There are no enlarged abdominal or pelvic lymph nodes. Mild aortic and branch vessel atherosclerosis without acute vascular findings. Reproductive: Hysterectomy.  No adnexal mass. Other: No ascites or peritoneal nodularity.  Intact abdominal wall. Musculoskeletal: There are few scattered new low-density osseous lesions most notably in the L5 vertebral body, upper sacrum and right iliac bone, suspicious for small metastases. No pathologic fracture. IMPRESSION: 1. Progressive pulmonary metastatic disease. 2. Progressive ill-defined confluent soft tissue in the AP window, suspicious for progressive adenopathy. 3. New lytic lesions within the spine, right 6th rib and right iliac bone, consistent with metastatic disease. There is a small anterior epidural component associated with a lesion in the T9 vertebral body, not expected to cause cord compression at this  time. 4. New clustered low-density lesions posteriorly in the right hepatic lobe, suspicious for metastatic disease. 5. Neck findings are dictated separately. 6. Aortic Atherosclerosis (ICD10-I70.0). Electronically Signed   By: Richardean Sale M.D.   On: 02/07/2020 17:08   MR Brain W Wo Contrast  Result Date: 02/10/2020 CLINICAL DATA:  Non-small cell lung cancer staging. EXAM: MRI HEAD WITHOUT AND WITH CONTRAST TECHNIQUE: Multiplanar, multiecho pulse sequences of the brain and surrounding structures were obtained without and with intravenous contrast. CONTRAST:  8 mL Gadavist COMPARISON:  11/30/2019 FINDINGS: Brain: The enhancing cerebral and cerebellar lesions on the prior study have all resolved aside from at most a punctate focus of subtle residual enhancement in the superior aspect of the right cerebellar hemisphere (series 24, image 52). New enhancing lesions measure 2 mm in the left frontal lobe (series 24, image 84), 3 mm in the left frontal lobe (series 24, image 104), 3 mm in the right frontal lobe (series 24, image 102), and 2 mm in the right frontal lobe (series 24, image 102). There is no associated edema with any of these lesions. Numerous scattered foci of chronic microhemorrhage are noted throughout the brain related to previously treated metastases. The ventricles and sulci are normal in size. No acute infarct, midline shift, or extra-axial fluid collection is identified. Vascular: Major intracranial vascular flow voids are preserved. Skull and upper cervical spine: Several new subcentimeter enhancing skull lesions with the largest measuring 1 cm in the midline parietal bone (series 24, image 130). Sinuses/Orbits: Small right maxillary sinus mucous retention cyst. Clear mastoid air cells. Unremarkable orbits. Other: None. IMPRESSION: 1. Four new enhancing cerebral lesions consistent with metastases. No edema. 2. Several new subcentimeter skull metastases. 3. Essentially complete resolution of  enhancing metastases on the prior study. Electronically Signed   By: Logan Bores M.D.   On: 02/10/2020 14:44   CT Abdomen Pelvis W Contrast  Result Date: 02/07/2020 CLINICAL DATA:  Restaging metastatic lung cancer (poorly differentiated adenocarcinoma). EXAM: CT CHEST, ABDOMEN, AND PELVIS WITH CONTRAST TECHNIQUE: Multidetector CT imaging of the chest, abdomen and pelvis was performed following the standard protocol during bolus administration of intravenous contrast. CONTRAST:  110m OMNIPAQUE IOHEXOL 300 MG/ML  SOLN COMPARISON:  Prior CTs 11/16/2019 and 08/22/2019. FINDINGS: CT CHEST FINDINGS Neck findings are dictated separately. Cardiovascular: Right IJ Port-A-Cath extends to the mid right atrial level. No  acute vascular findings are seen. There is mild extrinsic compression of the pulmonary arteries centrally by the enlarging central mediastinal process. There is mild aortic atherosclerosis. The heart size is stable. There is a stable small pericardial effusion. Mediastinum/Nodes: There is progressive ill-defined confluent soft tissue in the AP window no suspicious for progressive adenopathy. This measures up to 5.0 x 3.3 cm on image 21/2. Some of this may reflect complex fluid in the superior pericardial recess, although the fluid more inferiorly around the heart appears relatively simple. The thyroid gland, trachea and esophagus demonstrate no significant findings. Lungs/Pleura: Stable trace left pleural effusion. No right pleural effusion or pneumothorax. There are innumerable pulmonary nodules bilaterally with further enlargement. Representative nodules include a right lower lobe nodule measuring 3.1 x 2.6 cm on image 86/7 (previously 1.6 cm maximally) and a 2.2 x 1.4 cm left lower lobe nodule on image 67/7 (previously 1.0 cm maximally). Chronic opacity medially at the left lung base is similar to the prior study, likely due to chronic atelectasis and/or scarring. Patchy peribronchovascular opacities in  both lungs are not significantly changed, likely treatment related. Musculoskeletal/Chest wall: There is a new lytic metastasis involving the right 6th rib posteriorly with an associated pathologic fracture (image 40/7). There are new lytic lesions within the spine, most prominent within the T9 vertebral body (image 78/6). This lesion measures up to 1.7 cm on axial image 36/2 and is associated with a small anterior epidural component, not expected to cause cord compression at this time. There are probable additional smaller lesions at T1 and T2. CT ABDOMEN AND PELVIS FINDINGS Hepatobiliary: New clustered low-density lesions posteriorly in the right hepatic lobe measuring up to 1.2 cm on image 46/2, suspicious for metastatic disease. No evidence of gallstones, gallbladder wall thickening or biliary dilatation. Pancreas: Unremarkable. No pancreatic ductal dilatation or surrounding inflammatory changes. Spleen: Normal in size without focal abnormality. Adrenals/Urinary Tract: Both adrenal glands appear normal. The kidneys appear normal without evidence of urinary tract calculus, suspicious lesion or hydronephrosis. No bladder abnormalities are seen. Stomach/Bowel: The stomach appears unremarkable for its degree of distension. No evidence of bowel wall thickening, distention or surrounding inflammatory change. Moderate stool throughout the colon. Vascular/Lymphatic: There are no enlarged abdominal or pelvic lymph nodes. Mild aortic and branch vessel atherosclerosis without acute vascular findings. Reproductive: Hysterectomy.  No adnexal mass. Other: No ascites or peritoneal nodularity.  Intact abdominal wall. Musculoskeletal: There are few scattered new low-density osseous lesions most notably in the L5 vertebral body, upper sacrum and right iliac bone, suspicious for small metastases. No pathologic fracture. IMPRESSION: 1. Progressive pulmonary metastatic disease. 2. Progressive ill-defined confluent soft tissue in the  AP window, suspicious for progressive adenopathy. 3. New lytic lesions within the spine, right 6th rib and right iliac bone, consistent with metastatic disease. There is a small anterior epidural component associated with a lesion in the T9 vertebral body, not expected to cause cord compression at this time. 4. New clustered low-density lesions posteriorly in the right hepatic lobe, suspicious for metastatic disease. 5. Neck findings are dictated separately. 6. Aortic Atherosclerosis (ICD10-I70.0). Electronically Signed   By: Richardean Sale M.D.   On: 02/07/2020 17:08    ASSESSMENT AND PLAN: This is a very pleasant 55 years old African-American female with metastatic non-small cell lung cancer, adenocarcinoma and positive for EGFR mutation with deletion in exon 19.  The patient is currently on treatment with GILOTRIF 40 mg p.o. daily status post 13 months.  The patient is tolerating this  treatment well except for few episodes of diarrhea and mild skin rash. She is complaining of dizzy spells and vertigo recently. Repeat CT scan of the chest, abdomen and pelvis performed recently showed some evidence for disease progression with increase in size and number of pulmonary nodules. Her treatment was switched to Tagrisso 80 mg p.o. daily started January 01, 2018.  She is status post 12 months of treatment She had repeat MRI of the brain as well as CT scan of the chest, abdomen pelvis performed. Her MRI of the brain showed no concerning findings for disease progression in the brain but CT scan of the chest showed some evidence for progression with lymphangitic spread of the disease. She was referred to Dr. Durenda Hurt at [facility] and repeat molecular studies showed the emergence of BRAF mutation V600E.  I recommended for the patient to consider systemic chemotherapy and she is here today for evaluation and discussion of this option.  I had a lengthy discussion with the patient today regarding her  condition.  She understands that a combination of BRAF inhibitor and Tagrisso has not been well studied at this point. She is currently undergoing systemic chemotherapy with carboplatin for AUC of 5, Alimta 500 mg/M2 and Avastin 15 mg/KG every 3 weeks, status post 54 cycles.  Starting from cycle #7 she is on maintenance treatment with Alimta and Avastin every 3 weeks.   She also continues her current treatment with Tagrisso 80 mg p.o. daily. She has been tolerating the treatment well with no concerning issues except for fatigue and mild nausea. She had repeat MRI of the brain that unfortunately showed new small brain lesion. Her CT scan of the chest, abdomen pelvis showed clear evidence for disease progression with more than 50 new and enlarging pulmonary nodules. Clearly her current treatment is not working anymore and I recommended for her to discontinue her current systemic chemotherapy with maintenance Alimta and Avastin but she will continue with Tagrisso for now until we will start the next line of her treatment. 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 The patient is currently on treatment with olaparib 300 mg p.o. twice daily started December 08, 2019.  She is status post 2 months of treatment.  The patient has been tolerating this treatment well except for few episodes of nausea and vomiting. For the brain metastasis she underwent SRS to the brain lesions. She had repeat MRI of the brain as well as CT scan of the chest, abdomen pelvis performed recently.  I personally and independently reviewed the scan images and discussed the results with the patient and her sister today. Unfortunately the patient has significant disease progression in the chest as well as for new spots in the brain. I had a lengthy discussion with the patient today about her  current condition and treatment options. I gave the patient the option of palliative care on hospice referral versus consideration of second line systemic chemotherapy with docetaxel 75 Mg/M2 and Cyramza 10 mg/KG every 3 weeks with Neulasta support versus resuming her treatment with EGFR tyrosine kinase inhibitor again with either Tagrisso or Tarceva.  The patient would like some time to think about her options. I will also refer the patient back to Dr. Durenda Hurt to see if he has any other recommendation regarding her condition. In the meantime I recommended for the patient to  resume her treatment with Tagrisso.  She has enough supply at home until she make a decision. The patient will call with her decision in the next few days. For pain management I will give her a refill of fentanyl patch and oxycodone for breakthrough pain. The patient was advised to call immediately if she has any other concerning symptoms in the interval. I discussed the assessment and treatment plan with the patient. The patient was provided an opportunity to ask questions and all were answered. The patient agreed with the plan and demonstrated an understanding of the instructions.   The patient was advised to call back or seek an in-person evaluation if the symptoms worsen or if the condition fails to improve as anticipated.  The total time spent in the appointment was 50 minutes.   Eilleen Kempf, MD 02/13/2020 1:25 PM  Disclaimer: This note was dictated with voice recognition software. Similar sounding words can inadvertently be transcribed and may not be corrected upon review.

## 2020-02-18 ENCOUNTER — Emergency Department (HOSPITAL_COMMUNITY): Payer: Medicare Other

## 2020-02-18 ENCOUNTER — Inpatient Hospital Stay (HOSPITAL_COMMUNITY)
Admission: EM | Admit: 2020-02-18 | Discharge: 2020-02-22 | DRG: 176 | Disposition: A | Discharge status: Home Health | Payer: Medicare Other | Attending: Internal Medicine | Admitting: Internal Medicine

## 2020-02-18 ENCOUNTER — Other Ambulatory Visit: Payer: Self-pay

## 2020-02-18 ENCOUNTER — Encounter (HOSPITAL_COMMUNITY): Payer: Self-pay | Admitting: Emergency Medicine

## 2020-02-18 DIAGNOSIS — Z79899 Other long term (current) drug therapy: Secondary | ICD-10-CM

## 2020-02-18 DIAGNOSIS — K5900 Constipation, unspecified: Secondary | ICD-10-CM | POA: Diagnosis not present

## 2020-02-18 DIAGNOSIS — I10 Essential (primary) hypertension: Secondary | ICD-10-CM | POA: Diagnosis not present

## 2020-02-18 DIAGNOSIS — K5090 Crohn's disease, unspecified, without complications: Secondary | ICD-10-CM | POA: Diagnosis not present

## 2020-02-18 DIAGNOSIS — E876 Hypokalemia: Secondary | ICD-10-CM | POA: Diagnosis not present

## 2020-02-18 DIAGNOSIS — Z90710 Acquired absence of both cervix and uterus: Secondary | ICD-10-CM | POA: Diagnosis not present

## 2020-02-18 DIAGNOSIS — M549 Dorsalgia, unspecified: Secondary | ICD-10-CM | POA: Diagnosis not present

## 2020-02-18 DIAGNOSIS — I2690 Septic pulmonary embolism without acute cor pulmonale: Secondary | ICD-10-CM | POA: Diagnosis not present

## 2020-02-18 DIAGNOSIS — D649 Anemia, unspecified: Secondary | ICD-10-CM | POA: Diagnosis present

## 2020-02-18 DIAGNOSIS — Z20822 Contact with and (suspected) exposure to covid-19: Secondary | ICD-10-CM | POA: Diagnosis present

## 2020-02-18 DIAGNOSIS — Z888 Allergy status to other drugs, medicaments and biological substances status: Secondary | ICD-10-CM | POA: Diagnosis not present

## 2020-02-18 DIAGNOSIS — Z66 Do not resuscitate: Secondary | ICD-10-CM | POA: Diagnosis present

## 2020-02-18 DIAGNOSIS — C7931 Secondary malignant neoplasm of brain: Secondary | ICD-10-CM | POA: Diagnosis not present

## 2020-02-18 DIAGNOSIS — R079 Chest pain, unspecified: Secondary | ICD-10-CM | POA: Diagnosis not present

## 2020-02-18 DIAGNOSIS — C3492 Malignant neoplasm of unspecified part of left bronchus or lung: Secondary | ICD-10-CM | POA: Diagnosis present

## 2020-02-18 DIAGNOSIS — G8929 Other chronic pain: Secondary | ICD-10-CM | POA: Diagnosis not present

## 2020-02-18 DIAGNOSIS — C3490 Malignant neoplasm of unspecified part of unspecified bronchus or lung: Secondary | ICD-10-CM | POA: Diagnosis present

## 2020-02-18 DIAGNOSIS — Z743 Need for continuous supervision: Secondary | ICD-10-CM | POA: Diagnosis not present

## 2020-02-18 DIAGNOSIS — R911 Solitary pulmonary nodule: Secondary | ICD-10-CM | POA: Diagnosis not present

## 2020-02-18 DIAGNOSIS — K219 Gastro-esophageal reflux disease without esophagitis: Secondary | ICD-10-CM | POA: Diagnosis not present

## 2020-02-18 DIAGNOSIS — J90 Pleural effusion, not elsewhere classified: Secondary | ICD-10-CM | POA: Diagnosis not present

## 2020-02-18 DIAGNOSIS — R509 Fever, unspecified: Secondary | ICD-10-CM

## 2020-02-18 DIAGNOSIS — R6510 Systemic inflammatory response syndrome (SIRS) of non-infectious origin without acute organ dysfunction: Secondary | ICD-10-CM | POA: Diagnosis present

## 2020-02-18 DIAGNOSIS — I2699 Other pulmonary embolism without acute cor pulmonale: Secondary | ICD-10-CM | POA: Diagnosis not present

## 2020-02-18 DIAGNOSIS — R0902 Hypoxemia: Secondary | ICD-10-CM | POA: Diagnosis not present

## 2020-02-18 DIAGNOSIS — R0602 Shortness of breath: Secondary | ICD-10-CM | POA: Diagnosis not present

## 2020-02-18 DIAGNOSIS — D638 Anemia in other chronic diseases classified elsewhere: Secondary | ICD-10-CM | POA: Diagnosis not present

## 2020-02-18 DIAGNOSIS — C7951 Secondary malignant neoplasm of bone: Secondary | ICD-10-CM | POA: Diagnosis present

## 2020-02-18 DIAGNOSIS — A419 Sepsis, unspecified organism: Secondary | ICD-10-CM | POA: Diagnosis not present

## 2020-02-18 DIAGNOSIS — C3432 Malignant neoplasm of lower lobe, left bronchus or lung: Secondary | ICD-10-CM | POA: Diagnosis present

## 2020-02-18 DIAGNOSIS — C787 Secondary malignant neoplasm of liver and intrahepatic bile duct: Secondary | ICD-10-CM | POA: Diagnosis not present

## 2020-02-18 DIAGNOSIS — I499 Cardiac arrhythmia, unspecified: Secondary | ICD-10-CM | POA: Diagnosis not present

## 2020-02-18 DIAGNOSIS — Z885 Allergy status to narcotic agent status: Secondary | ICD-10-CM | POA: Diagnosis not present

## 2020-02-18 DIAGNOSIS — Z8249 Family history of ischemic heart disease and other diseases of the circulatory system: Secondary | ICD-10-CM | POA: Diagnosis not present

## 2020-02-18 DIAGNOSIS — R918 Other nonspecific abnormal finding of lung field: Secondary | ICD-10-CM | POA: Diagnosis not present

## 2020-02-18 DIAGNOSIS — I517 Cardiomegaly: Secondary | ICD-10-CM | POA: Diagnosis not present

## 2020-02-18 DIAGNOSIS — I313 Pericardial effusion (noninflammatory): Secondary | ICD-10-CM | POA: Diagnosis not present

## 2020-02-18 LAB — CBC WITH DIFFERENTIAL/PLATELET
Abs Immature Granulocytes: 0.23 10*3/uL — ABNORMAL HIGH (ref 0.00–0.07)
Basophils Absolute: 0.1 10*3/uL (ref 0.0–0.1)
Basophils Relative: 1 %
Eosinophils Absolute: 0 10*3/uL (ref 0.0–0.5)
Eosinophils Relative: 0 %
HCT: 24.7 % — ABNORMAL LOW (ref 36.0–46.0)
Hemoglobin: 7.9 g/dL — ABNORMAL LOW (ref 12.0–15.0)
Immature Granulocytes: 2 %
Lymphocytes Relative: 6 %
Lymphs Abs: 0.9 10*3/uL (ref 0.7–4.0)
MCH: 33.3 pg (ref 26.0–34.0)
MCHC: 32 g/dL (ref 30.0–36.0)
MCV: 104.2 fL — ABNORMAL HIGH (ref 80.0–100.0)
Monocytes Absolute: 2 10*3/uL — ABNORMAL HIGH (ref 0.1–1.0)
Monocytes Relative: 14 %
Neutro Abs: 11.5 10*3/uL — ABNORMAL HIGH (ref 1.7–7.7)
Neutrophils Relative %: 77 %
Platelets: 260 10*3/uL (ref 150–400)
RBC: 2.37 MIL/uL — ABNORMAL LOW (ref 3.87–5.11)
RDW: 23.8 % — ABNORMAL HIGH (ref 11.5–15.5)
WBC: 14.7 10*3/uL — ABNORMAL HIGH (ref 4.0–10.5)
nRBC: 0.5 % — ABNORMAL HIGH (ref 0.0–0.2)

## 2020-02-18 LAB — LACTIC ACID, PLASMA: Lactic Acid, Venous: 1.8 mmol/L (ref 0.5–1.9)

## 2020-02-18 LAB — COMPREHENSIVE METABOLIC PANEL
ALT: 20 U/L (ref 0–44)
AST: 42 U/L — ABNORMAL HIGH (ref 15–41)
Albumin: 3 g/dL — ABNORMAL LOW (ref 3.5–5.0)
Alkaline Phosphatase: 67 U/L (ref 38–126)
Anion gap: 13 (ref 5–15)
BUN: 9 mg/dL (ref 6–20)
CO2: 26 mmol/L (ref 22–32)
Calcium: 8.9 mg/dL (ref 8.9–10.3)
Chloride: 93 mmol/L — ABNORMAL LOW (ref 98–111)
Creatinine, Ser: 0.99 mg/dL (ref 0.44–1.00)
GFR, Estimated: >60 mL/min (ref >60)
Glucose, Bld: 168 mg/dL — ABNORMAL HIGH (ref 70–99)
Potassium: 2.7 mmol/L — CL (ref 3.5–5.1)
Sodium: 132 mmol/L — ABNORMAL LOW (ref 135–145)
Total Bilirubin: 0.6 mg/dL (ref 0.3–1.2)
Total Protein: 7 g/dL (ref 6.5–8.1)

## 2020-02-18 LAB — SARS CORONAVIRUS 2 BY RT PCR (HOSPITAL ORDER, PERFORMED IN ~~LOC~~ HOSPITAL LAB): SARS Coronavirus 2: NEGATIVE

## 2020-02-18 LAB — I-STAT BETA HCG BLOOD, ED (MC, WL, AP ONLY): I-stat hCG, quantitative: <5 m[IU]/mL (ref <5)

## 2020-02-18 LAB — PROTIME-INR
INR: 1.4 — ABNORMAL HIGH (ref 0.8–1.2)
Prothrombin Time: 16.7 seconds — ABNORMAL HIGH (ref 11.4–15.2)

## 2020-02-18 LAB — POC OCCULT BLOOD, ED: Fecal Occult Bld: NEGATIVE

## 2020-02-18 MED ORDER — ACETAMINOPHEN 650 MG RE SUPP: 650.0000 mg | Freq: Four times a day (QID) | RECTAL | Status: DC | PRN

## 2020-02-18 MED ORDER — RIVAROXABAN 15 MG PO TABS
15.0000 mg | ORAL_TABLET | Freq: Two times a day (BID) | ORAL | Status: DC
  Administered 2020-02-19 – 2020-02-22 (×8): 15 mg via ORAL
  Filled 2020-02-18 (×9): qty 1

## 2020-02-18 MED ORDER — RIVAROXABAN 15 MG PO TABS
15.0000 mg | ORAL_TABLET | Freq: Once | ORAL | Status: AC
  Administered 2020-02-18: 15 mg via ORAL
  Filled 2020-02-18: qty 1

## 2020-02-18 MED ORDER — LACTATED RINGERS IV BOLUS (SEPSIS)
500.0000 mL | Freq: Once | INTRAVENOUS | Status: AC
  Administered 2020-02-18: 500 mL via INTRAVENOUS

## 2020-02-18 MED ORDER — LACTATED RINGERS IV BOLUS (SEPSIS)
1000.0000 mL | Freq: Once | INTRAVENOUS | Status: AC
  Administered 2020-02-18: 1000 mL via INTRAVENOUS

## 2020-02-18 MED ORDER — SODIUM CHLORIDE 0.9 % IV SOLN
2.0000 g | INTRAVENOUS | Status: DC
  Administered 2020-02-18 – 2020-02-22 (×5): 2 g via INTRAVENOUS
  Filled 2020-02-18 (×4): qty 2
  Filled 2020-02-18: qty 20

## 2020-02-18 MED ORDER — GUAIFENESIN ER 600 MG PO TB12: 600.0000 mg | ORAL_TABLET | Freq: Two times a day (BID) | ORAL | Status: DC | PRN

## 2020-02-18 MED ORDER — METHOCARBAMOL 500 MG PO TABS: 500.0000 mg | ORAL_TABLET | Freq: Three times a day (TID) | ORAL | Status: DC | PRN

## 2020-02-18 MED ORDER — HYDROCODONE-HOMATROPINE 5-1.5 MG/5ML PO SYRP: 5.0000 mL | ORAL_SOLUTION | Freq: Four times a day (QID) | ORAL | Status: DC | PRN

## 2020-02-18 MED ORDER — FLUTICASONE PROPIONATE 50 MCG/ACT NA SUSP: 2.0000 | Freq: Every day | NASAL | Status: DC | PRN

## 2020-02-18 MED ORDER — POTASSIUM CHLORIDE 10 MEQ/100ML IV SOLN
10.0000 meq | INTRAVENOUS | Status: AC
  Administered 2020-02-18 (×2): 10 meq via INTRAVENOUS
  Filled 2020-02-18 (×2): qty 100

## 2020-02-18 MED ORDER — IOHEXOL 350 MG/ML SOLN
100.0000 mL | Freq: Once | INTRAVENOUS | Status: AC | PRN
  Administered 2020-02-18: 100 mL via INTRAVENOUS

## 2020-02-18 MED ORDER — FOLIC ACID 1 MG PO TABS
1.0000 mg | ORAL_TABLET | Freq: Every day | ORAL | Status: DC
  Administered 2020-02-18 – 2020-02-22 (×5): 1 mg via ORAL
  Filled 2020-02-18 (×5): qty 1

## 2020-02-18 MED ORDER — ONDANSETRON HCL 4 MG PO TABS
4.0000 mg | ORAL_TABLET | Freq: Four times a day (QID) | ORAL | Status: DC | PRN
  Administered 2020-02-19 (×3): 4 mg via ORAL
  Filled 2020-02-18 (×3): qty 1

## 2020-02-18 MED ORDER — ACETAMINOPHEN 325 MG PO TABS
650.0000 mg | ORAL_TABLET | Freq: Four times a day (QID) | ORAL | Status: DC | PRN
  Administered 2020-02-21: 650 mg via ORAL
  Filled 2020-02-18 (×2): qty 2

## 2020-02-18 MED ORDER — MAGNESIUM OXIDE 400 (241.3 MG) MG PO TABS
400.0000 mg | ORAL_TABLET | Freq: Every day | ORAL | Status: DC
  Administered 2020-02-19 – 2020-02-22 (×4): 400 mg via ORAL
  Filled 2020-02-18 (×4): qty 1

## 2020-02-18 MED ORDER — LACTATED RINGERS IV SOLN: INTRAVENOUS | Status: DC

## 2020-02-18 MED ORDER — SODIUM CHLORIDE 0.9 % IV SOLN
500.0000 mg | INTRAVENOUS | Status: DC
  Administered 2020-02-18 – 2020-02-19 (×2): 500 mg via INTRAVENOUS
  Filled 2020-02-18 (×3): qty 500

## 2020-02-18 MED ORDER — POTASSIUM CHLORIDE 10 MEQ/100ML IV SOLN
10.0000 meq | INTRAVENOUS | Status: AC
  Administered 2020-02-18: 10 meq via INTRAVENOUS
  Filled 2020-02-18: qty 100

## 2020-02-18 MED ORDER — OSIMERTINIB MESYLATE 80 MG PO TABS: 80.0000 mg | ORAL_TABLET | Freq: Every day | ORAL | Status: DC

## 2020-02-18 MED ORDER — PANTOPRAZOLE SODIUM 40 MG PO TBEC
40.0000 mg | DELAYED_RELEASE_TABLET | Freq: Every day | ORAL | Status: DC
Start: 2020-02-19 — End: 2020-02-22
  Administered 2020-02-19 – 2020-02-22 (×4): 40 mg via ORAL
  Filled 2020-02-18 (×4): qty 1

## 2020-02-18 MED ORDER — ONDANSETRON HCL 4 MG/2ML IJ SOLN
4.0000 mg | Freq: Once | INTRAMUSCULAR | Status: AC
  Administered 2020-02-18: 4 mg via INTRAVENOUS
  Filled 2020-02-18: qty 2

## 2020-02-18 MED ORDER — CLINDAMYCIN PHOSPHATE 1 % EX SOLN
1.0000 "application " | Freq: Two times a day (BID) | CUTANEOUS | Status: DC
  Administered 2020-02-19 – 2020-02-22 (×7): 1 via TOPICAL
  Filled 2020-02-18: qty 30

## 2020-02-18 MED ORDER — OXYCODONE HCL 5 MG PO TABS
5.0000 mg | ORAL_TABLET | Freq: Four times a day (QID) | ORAL | Status: DC | PRN
  Administered 2020-02-18 – 2020-02-22 (×7): 5 mg via ORAL
  Filled 2020-02-18 (×7): qty 1

## 2020-02-18 MED ORDER — SODIUM CHLORIDE 0.9 % IV BOLUS: 1000.0000 mL | Freq: Once | INTRAVENOUS | Status: DC

## 2020-02-18 MED ORDER — ALBUTEROL SULFATE HFA 108 (90 BASE) MCG/ACT IN AERS
2.0000 | INHALATION_SPRAY | Freq: Four times a day (QID) | RESPIRATORY_TRACT | Status: DC | PRN
  Filled 2020-02-18: qty 6.7

## 2020-02-18 MED ORDER — RIVAROXABAN 20 MG PO TABS: 20.0000 mg | ORAL_TABLET | Freq: Every day | ORAL | Status: DC

## 2020-02-18 MED ORDER — FENTANYL 50 MCG/HR TD PT72
1.0000 | MEDICATED_PATCH | TRANSDERMAL | Status: DC
  Administered 2020-02-20: 1 via TRANSDERMAL
  Filled 2020-02-18 (×2): qty 1

## 2020-02-18 MED ORDER — LOPERAMIDE HCL 1 MG/7.5ML PO SUSP
2.0000 mg | Freq: Every day | ORAL | Status: DC | PRN
  Filled 2020-02-18: qty 15

## 2020-02-18 MED ORDER — POTASSIUM CHLORIDE CRYS ER 20 MEQ PO TBCR
40.0000 meq | EXTENDED_RELEASE_TABLET | Freq: Once | ORAL | Status: AC
  Administered 2020-02-18: 40 meq via ORAL
  Filled 2020-02-18: qty 2

## 2020-02-18 MED ORDER — ONDANSETRON HCL 4 MG/2ML IJ SOLN: 4.0000 mg | Freq: Four times a day (QID) | INTRAMUSCULAR | Status: DC | PRN

## 2020-02-18 NOTE — ED Triage Notes (Signed)
Patient BIBA c/o fever and lethargy x1 day. Hx of stage 4 lung cancer patient with progression to brain. Patient also reports back pain x1 week. Given 1000 mg Tylenol w/ EMS. Patient AOx4 but seemed slightly altered at times with EMS.  P 116 SpO2 96% 2 L [place]  T 101.4 CBG 107

## 2020-02-18 NOTE — ED Notes (Signed)
Patient was put on room air and her O2 saturation remained above 90%.

## 2020-02-18 NOTE — Discharge Instructions (Signed)
Information on my medicine - XARELTO (rivaroxaban)  This medication education was reviewed with me or my healthcare representative as part of my discharge preparation.   WHY WAS XARELTO PRESCRIBED FOR YOU? Xarelto was prescribed to treat blood clots that may have been found in the veins of your legs (deep vein thrombosis) or in your lungs (pulmonary embolism) and to reduce the risk of them occurring again.  What do you need to know about Xarelto? The starting dose is one 15 mg tablet taken TWICE daily with food for the FIRST 21 DAYS then on 03/11/2020  the dose is changed to one 20 mg tablet taken ONCE A DAY with your evening meal.  DO NOT stop taking Xarelto without talking to the health care provider who prescribed the medication.  Refill your prescription for 20 mg tablets before you run out.  After discharge, you should have regular check-up appointments with your healthcare provider that is prescribing your Xarelto.  In the future your dose may need to be changed if your kidney function changes by a significant amount.  What do you do if you miss a dose? If you are taking Xarelto TWICE DAILY and you miss a dose, take it as soon as you remember. You may take two 15 mg tablets (total 30 mg) at the same time then resume your regularly scheduled 15 mg twice daily the next day.  If you are taking Xarelto ONCE DAILY and you miss a dose, take it as soon as you remember on the same day then continue your regularly scheduled once daily regimen the next day. Do not take two doses of Xarelto at the same time.   Important Safety Information Xarelto is a blood thinner medicine that can cause bleeding. You should call your healthcare provider right away if you experience any of the following: ? Bleeding from an injury or your nose that does not stop. ? Unusual colored urine (red or dark brown) or unusual colored stools (red or black). ? Unusual bruising for unknown reasons. ? A serious fall or  if you hit your head (even if there is no bleeding).  Some medicines may interact with Xarelto and might increase your risk of bleeding while on Xarelto. To help avoid this, consult your healthcare provider or pharmacist prior to using any new prescription or non-prescription medications, including herbals, vitamins, non-steroidal anti-inflammatory drugs (NSAIDs) and supplements.  This website has more information on Xarelto: [email or web address]

## 2020-02-18 NOTE — H&P (Signed)
History and Physical    Nancy Moore WLN:[id number] DOB: March 28, 1965 DOA: 02/18/2020  PCP: Darreld Mclean, MD  Patient coming from: Home  I have personally briefly reviewed patient's old medical records in [place]  Chief Complaint: Fever  HPI: Nancy Moore is a 55 y.o. female with medical history significant of stage 4 NSCLC with mets to lung and brain, recent progression of dz including lymphangitic spread in lung.  Pt presents to ED with c/o fever.  101.2 fever with EMS.  Got 1g tylenol with EMS.  Pt reports SOB for past couple of days with associated mild non-productive cough and CP.  Symptoms constant, persistent.  Does have nausea but that's baseline with chemo.  Has been vaccinated and boosted to [place].  No known sick contacts.  No vomiting, diarrhea, headache.  Does have chronic back pain.   ED Course: WBC 14.7k, Tm 100.7.  HGB 7.9 down from 9.5 x11 days ago, hemoccult neg.  Started on empiric rocephin+azithro.  CTA: has small PE (in addition to lung CA findings).   Review of Systems: As per HPI, otherwise all review of systems negative.  Past Medical History:  Diagnosis Date  . Adenocarcinoma of left lung, stage 4 ([place]) 10/28/2016  . Anemia   . Arthritis   . Constipation   . Crohn's disease ([place])    in remission 35 years +  . Dyspnea    gets "winded"  . GERD (gastroesophageal reflux disease)   . Goals of care, counseling/discussion 10/28/2016  . History of hiatal hernia    noted on CT 12/20  . Hypertension   . Pneumonia    walking pneumonia in the late 80's  . Recurrent pleural effusion on left     Past Surgical History:  Procedure Laterality Date  . ABDOMINAL HYSTERECTOMY     partial hysterectomy  . BREAST EXCISIONAL BIOPSY Left 20+ yrs ago   benign  . CHEST TUBE INSERTION Left 10/26/2018   Procedure: INSERTION PLEURAL DRAINAGE CATHETER;  Surgeon: Ivin Poot, MD;  Location: [facility];  Service: Thoracic;  Laterality: Left;  .  COLONOSCOPY    . IR IMAGING GUIDED PORT INSERTION  07/04/2019  . PERICARDIAL WINDOW N/A 01/10/2017   Procedure: PERICARDIAL WINDOW- SUB XYPHOID, RIGHT CHEST TUBE;  Surgeon: Ivin Poot, MD;  Location: [facility];  Service: Thoracic;  Laterality: N/A;  . PORT-A-CATH REMOVAL Left 05/10/2019   Procedure: REMOVAL PORT-A-CATH;  Surgeon: Ivin Poot, MD;  Location: [facility];  Service: Thoracic;  Laterality: Left;  . PORTACATH PLACEMENT Left 02/21/2019   Procedure: INSERTION PORT-A-CATH;  Surgeon: Ivin Poot, MD;  Location: [facility];  Service: Thoracic;  Laterality: Left;  . REMOVAL OF PLEURAL DRAINAGE CATHETER Left 02/21/2019   Procedure: REMOVAL OF PLEURAL DRAINAGE CATHETER;  Surgeon: Ivin Poot, MD;  Location: [facility];  Service: Thoracic;  Laterality: Left;  [facility] VIDEO BRONCHOSCOPY Bilateral 10/21/2016   Procedure: VIDEO BRONCHOSCOPY WITH FLUORO;  Surgeon: Tanda Rockers, MD;  Location: [facility];  Service: Cardiopulmonary;  Laterality: Bilateral;     reports that she has never smoked. She has never used smokeless tobacco. She reports previous alcohol use. She reports that she does not use drugs.  Allergies  Allergen Reactions  . Percocet [Oxycodone-Acetaminophen] Other (See Comments)    "makes me dizzy" / takes TID at home  . Lisinopril Cough  . Losartan Cough    Family History  Problem Relation Age of Onset  . Asthma Mother   .  Stroke Mother   . Hypertension Mother   . Heart attack Father   . Hypertension Father   . Hyperlipidemia Father   . Dementia Father   . Emphysema Maternal Grandmother   . Hypertension Maternal Grandmother   . Colon cancer Paternal Grandmother   . Brain cancer Maternal Uncle      Prior to Admission medications   Medication Sig Start Date End Date Taking? Authorizing Provider  albuterol (VENTOLIN HFA) 108 (90 Base) MCG/ACT inhaler Inhale 2 puffs into the lungs every 6 (six) hours as needed for wheezing or shortness of breath. 02/03/19  Yes Curt Bears, MD  clindamycin (CLINDAGEL) 1 % gel Apply 1 application topically 2 (two) times daily. 12/30/18  Yes Copland, Gay Filler, MD  dexamethasone (DECADRON) 4 MG tablet 1 tablet p.o. twice daily the day before, day of and day after chemotherapy every 3 weeks 10/04/19  Yes Curt Bears, MD  fentaNYL (DURAGESIC) 50 MCG/HR Place 1 patch onto the skin every 3 (three) days. 02/13/20  Yes Curt Bears, MD  fluticasone [facility]) 50 MCG/ACT nasal spray Place 2 sprays into both nostrils daily as needed for allergies. 05/24/19  Yes Copland, Gay Filler, MD  folic acid (FOLVITE) 1 MG tablet TAKE 1 TABLET BY MOUTH ONCE A DAY Patient taking differently: Take 1 mg by mouth daily. 12/27/19  Yes Curt Bears, MD  furosemide (LASIX) 40 MG tablet TAKE 1 TABLET BY MOUTH ONCE DAILY Patient taking differently: Take 40 mg by mouth daily. 12/27/19  Yes Copland, Gay Filler, MD  guaiFENesin (MUCINEX) 600 MG 12 hr tablet Take 600 mg by mouth 2 (two) times daily as needed for to loosen phlegm.    Yes [provider]  HYDROcodone-homatropine (HYCODAN) 5-1.5 MG/5ML syrup Take 5 mLs by mouth every 6 (six) hours as needed for cough. 02/13/20  Yes Curt Bears, MD  lidocaine-prilocaine (EMLA) cream APPLY TO THE AFFECTED AREA(S) AS NEEDED Patient taking differently: Apply 1 application topically daily as needed (chemo therapy port). 08/24/19  Yes Curt Bears, MD  loperamide (IMODIUM) 1 MG/5ML solution Take 10 mLs (2 mg total) by mouth as needed for diarrhea or loose stools. Patient taking differently: Take 2 mg by mouth daily as needed for diarrhea or loose stools. 03/24/19  Yes Curt Bears, MD  LORazepam (ATIVAN) 1 MG tablet Take 1 tablet PO 30 min before radiation therapy and/or MRIs. 12/05/19  Yes Eppie Gibson, MD  magnesium oxide (MAG-OX) 400 (241.3 Mg) MG tablet Take 1 tablet (400 mg total) by mouth daily. 01/23/17  Yes Rama, Venetia Maxon, MD  methocarbamol (ROBAXIN) 500 MG tablet TAKE 1 TABLET (500 MG  TOTAL) BY MOUTH EVERY 8 (EIGHT) HOURS AS NEEDED FOR MUSCLE SPASMS. 12/02/18  Yes Copland, Gay Filler, MD  nystatin cream (MYCOSTATIN) APPLY 1 APPLICATION TOPICALLY 2 (TWO) TIMES DAILY. Patient taking differently: Apply 1 application topically daily as needed (skin irritaion). 05/05/19  Yes Copland, Gay Filler, MD  ondansetron (ZOFRAN) 8 MG tablet TAKE 1 TABLET BY MOUTH EVERY 8 HOURS AS NEEDED FOR NAUSEA OR VOMITING Patient taking differently: Take 8 mg by mouth every 8 (eight) hours as needed for nausea or vomiting. 04/15/19  Yes Heilingoetter, Cassandra L, PA-C  osimertinib mesylate (TAGRISSO) 80 MG tablet Take 80 mg by mouth daily.   Yes [provider]  oxyCODONE (OXY IR/ROXICODONE) 5 MG immediate release tablet TAKE 1 TABLET BY MOUTH EVERY 6 HOURS AS NEEDED FOR PAIN Patient taking differently: Take 5 mg by mouth every 6 (six) hours as needed  for moderate pain. 01/13/20  Yes Heilingoetter, Cassandra L, PA-C  pantoprazole (PROTONIX) 40 MG tablet Take 1 tablet (40 mg total) by mouth daily. 09/14/19  Yes Copland, Gay Filler, MD  potassium chloride SA (KLOR-CON) 20 MEQ tablet Take 1 tablet (20 mEq total) by mouth 2 (two) times daily. 09/14/19  Yes Copland, Gay Filler, MD  prochlorperazine (COMPAZINE) 10 MG tablet Take 1 tablet (10 mg total) by mouth every 6 (six) hours as needed for nausea or vomiting. 12/21/19  Yes Curt Bears, MD    Physical Exam: Vitals:   02/18/20 1730 02/18/20 1830 02/18/20 1900 02/18/20 2100  BP: 110/69 130/73 125/74 123/77  Pulse: 90 100 95 98  Resp: 19 15 15 14   Temp:      TempSrc:      SpO2: 92% 91% 93% 90%    Constitutional: NAD, calm, comfortable Eyes: PERRL, lids and conjunctivae normal ENMT: Mucous membranes are moist. Posterior pharynx clear of any exudate or lesions.Normal dentition.  Neck: normal, supple, no masses, no thyromegaly Respiratory: clear to auscultation bilaterally, no wheezing, no crackles. Normal respiratory effort. No accessory muscle  use.  Cardiovascular: Regular rate and rhythm, no murmurs / rubs / gallops. No extremity edema. 2+ pedal pulses. No carotid bruits.  Abdomen: no tenderness, no masses palpated. No hepatosplenomegaly. Bowel sounds positive.  Musculoskeletal: no clubbing / cyanosis. No joint deformity upper and lower extremities. Good ROM, no contractures. Normal muscle tone.  Skin: no rashes, lesions, ulcers. No induration Neurologic: CN 2-12 grossly intact. Sensation intact, DTR normal. Strength 5/5 in all 4.  Psychiatric: Normal judgment and insight. Alert and oriented x 3. Normal mood.    Labs on Admission: I have personally reviewed following labs and imaging studies  CBC: Recent Labs  Lab 02/18/20 1527  WBC 14.7*  NEUTROABS 11.5*  HGB 7.9*  HCT 24.7*  MCV 104.2*  PLT 656   Basic Metabolic Panel: Recent Labs  Lab 02/18/20 1527  NA 132*  K 2.7*  CL 93*  CO2 26  GLUCOSE 168*  BUN 9  CREATININE 0.99  CALCIUM 8.9   GFR: CrCl cannot be calculated (Unknown ideal weight.). Liver Function Tests: Recent Labs  Lab 02/18/20 1527  AST 42*  ALT 20  ALKPHOS 67  BILITOT 0.6  PROT 7.0  ALBUMIN 3.0*   No results for input(s): LIPASE, AMYLASE in the last 168 hours. No results for input(s): AMMONIA in the last 168 hours. Coagulation Profile: Recent Labs  Lab 02/18/20 1527  INR 1.4*   Cardiac Enzymes: No results for input(s): CKTOTAL, CKMB, CKMBINDEX, TROPONINI in the last 168 hours. BNP (last 3 results) No results for input(s): PROBNP in the last 8760 hours. HbA1C: No results for input(s): HGBA1C in the last 72 hours. CBG: No results for input(s): GLUCAP in the last 168 hours. Lipid Profile: No results for input(s): CHOL, HDL, LDLCALC, TRIG, CHOLHDL, LDLDIRECT in the last 72 hours. Thyroid Function Tests: No results for input(s): TSH, T4TOTAL, FREET4, T3FREE, THYROIDAB in the last 72 hours. Anemia Panel: No results for input(s): VITAMINB12, FOLATE, FERRITIN, TIBC, IRON, RETICCTPCT  in the last 72 hours. Urine analysis:    Component Value Date/Time   COLORURINE YELLOW 03/15/2019 [place] 03/15/2019 1333   LABSPEC 1.020 03/15/2019 1333   PHURINE 5.5 03/15/2019 1333   GLUCOSEU 100 (A) 03/15/2019 1333   HGBUR NEGATIVE 03/15/2019 1333   BILIRUBINUR MODERATE (A) 03/15/2019 1333   KETONESUR NEGATIVE 03/15/2019 1333   PROTEINUR TRACE (A) 01/10/2020 1400  UROBILINOGEN 1.0 07/22/2010 2017   NITRITE POSITIVE (A) 03/15/2019 1333   LEUKOCYTESUR TRACE (A) 03/15/2019 1333    Radiological Exams on Admission: CT Angio Chest PE W/Cm &/Or Wo Cm  Result Date: 02/18/2020 CLINICAL DATA:  Chest pain, short of breath, fever, lethargy, history of stage IV metastatic lung cancer EXAM: CT ANGIOGRAPHY CHEST WITH CONTRAST TECHNIQUE: Multidetector CT imaging of the chest was performed using the standard protocol during bolus administration of intravenous contrast. Multiplanar CT image reconstructions and MIPs were obtained to evaluate the vascular anatomy. CONTRAST:  192m OMNIPAQUE IOHEXOL 350 MG/ML SOLN COMPARISON:  02/07/2020 FINDINGS: Cardiovascular: This is a technically adequate evaluation of the pulmonary vasculature. There is a segmental right upper lobe pulmonary embolus. Minimal clot burden. No other filling defects. Heart is not enlarged. Small pericardial effusion not significantly changed since prior study. Stable aneurysmal dilation of the ascending thoracic aorta measuring 3.7 cm. No dissection. Minimal atherosclerosis. Mediastinum/Nodes: Soft tissue mass in the AP window now measures up to 5.3 cm in diameter, previously having measured 5.1 cm, consistent with progressive adenopathy. Increasing mass effect upon the trachea and left mainstem bronchus, though airways remain patent. Thyroid and esophagus are grossly unremarkable. Lungs/Pleura: The diffuse pulmonary metastases of not changed significantly in size or number since the prior exam performed 11 days ago. Small  bilateral pleural effusions are noted, less than 500 cc each. Diffuse interstitial prominence throughout the left lung is concerning for lymphangitic spread of disease given the progressive mediastinal adenopathy. No pneumothorax. Upper Abdomen: Metastatic lesion within the right lobe liver measures up to 2 cm on image 101/4, previously measuring 1.7 cm. Musculoskeletal: The metastatic lesion within the right posterior sixth rib with associated pathologic fracture is again noted unchanged. Mottled lucencies throughout the thoracic spine, most pronounced at T9, consistent with metastatic disease. No new fractures. Review of the MIP images confirms the above findings. IMPRESSION: 1. Single segmental right upper lobe pulmonary embolus. Minimal clot burden. No right heart strain. 2. Diffuse metastatic disease, with innumerable bilateral pulmonary nodules and masses not appreciably changed since recent exam. 3. Slight enlargement of the AP window adenopathy, with mass effect upon the trachea and left mainstem bronchus as above. Diffuse interstitial prominence throughout the left lung concerning for lymphangitic spread of disease. 4. Increased size of right lobe liver metastasis seen previously. 5. Stable bony metastatic disease as above. 6. Stable pericardial effusion. Critical Value/emergent results were called by telephone at the time of interpretation on 02/18/2020 at 6:43 pm to provider PA JCephus Slater who verbally acknowledged these results. Electronically Signed   By: MRanda NgoM.D.   On: 02/18/2020 18:44   DG Chest Port 1 View  Result Date: 02/18/2020 CLINICAL DATA:  Stage IV lung cancer EXAM: PORTABLE CHEST 1 VIEW COMPARISON:  CT chest dated 02/07/2020 FINDINGS: Patchy/masslike opacity in the left lower lobe with numerous bilateral pulmonary nodules, better evaluated on prior CT. Suspected small left pleural effusion. No pneumothorax. Cardiomegaly. Right chest power port terminates at the cavoatrial  junction. IMPRESSION: Patchy/masslike opacity in the left lower lobe, better evaluated on prior CT. Numerous bilateral pulmonary metastases. Suspected small left pleural effusion. Electronically Signed   By: SJulian HyM.D.   On: 02/18/2020 16:15    EKG: Independently reviewed.  Assessment/Plan Principal Problem:   Acute pulmonary embolism (HCC) Active Problems:   Adenocarcinoma of left lung, stage 4 (HCC)   Primary malignant neoplasm of lung with metastasis to brain (HCC)   Anemia   SIRS (systemic inflammatory response  syndrome) ([place])    1. Acute PE - in setting of metastatic CA 1. EDP spoke with Dr. Lindi Adie: 1. Start Xarelto 2. 2d echo and [place] BLE 3. No RHS on CT scan 2. SIRS - 1. Possibly just due to PE 2. Procalcitonin pending 3. UA pending 4. BCx pending 5. Will leave her on rocephin+azithro for tonight, pending above studies. 6. Got sepsis fluid bolus and holding lasix 3. NSCLC - 1. Cont Tagrisso 2. Message sent to oncology for AM consult 4. Hypokalemia - 1. Replace 2. Holding lasix 3. Repeat BMP in AM 5. Anemia - 1. Hemoccult neg, no obvious bleeding source 2. Repeat CBC in AM 3. Type and screen  DVT prophylaxis: Xarelto Code Status: DNR - confirmed with pt and sister Family Communication: Sister at bedside Disposition Plan: Home after sepsis r/o and PE w/u Consults called: EDP spoke with Dr. Lindi Adie Admission status: Place in obs    Inaki Vantine, [facility] [place] [facility]  How to contact the [facility] Attending or Consulting provider [place] or covering provider during after hours [place], for this patient?  1. Check the care team in [facility] and look for a) attending/consulting [facility] provider listed and b) the [facility] team listed 2. Log into [email or web address]  Amion Physician Scheduling and messaging for groups and whole hospitals  On call and physician scheduling software for group practices, residents, hospitalists and other medical providers for call, clinic, rotation  and shift schedules. On[employer] is a hospital-wide system for scheduling doctors and paging doctors on call. EasyPlot is for scientific plotting and data analysis.  [email or web address]  and use [place]'s universal password to access. If you do not have the password, please contact the hospital operator.  3. Locate the [facility] provider you are looking for under [facility] and page to a number that you can be directly reached. 4. If you still have difficulty reaching the provider, please page the [facility] (Director on Call) for the Hospitalists listed on amion for assistance.  02/18/2020, 9:15 PM

## 2020-02-18 NOTE — ED Provider Notes (Signed)
[facility] DEPT Provider Note   CSN: [id number] Arrival date & time: 02/18/20  1503     History Chief Complaint  Patient presents with  . Fever    Nancy Moore is a 55 y.o. female with pertinent past medical history of stage IV non-small cell lung cancer followed by Dr. Earlie Server on oral chemotherapy, hypertension, arthritis, anemia that presents to the emergency department today via EMS for fever.  Patient states that she had a 101.2 degree fever with EMS, states that she noted she had a fever today.  EMS did give 1000 g of Tylenol, temperature here today 99.5.  Patient states that she has been feeling short of breath for the past couple of days with associated mild nonproductive cough.  Also felt that she feels nauseous, does get this with chemotherapy.  No other symptoms.  Denies any vomiting, diarrhea, headache, myalgias besides her back.  States that this is chronic.  Denies any urinary symptoms.  Has been vaccinated and boosted against COVID.  Denies any sick contacts.  States that she is primarily homebound, only her history comes into her house to visit.  HPI     Past Medical History:  Diagnosis Date  . Adenocarcinoma of left lung, stage 4 ([place]) 10/28/2016  . Anemia   . Arthritis   . Constipation   . Crohn's disease ([place])    in remission 35 years +  . Dyspnea    gets "winded"  . GERD (gastroesophageal reflux disease)   . Goals of care, counseling/discussion 10/28/2016  . History of hiatal hernia    noted on CT 12/20  . Hypertension   . Pneumonia    walking pneumonia in the late 80's  . Recurrent pleural effusion on left     Patient Active Problem List   Diagnosis Date Noted  . Visit for wound check 06/01/2019  . Infection due to Port-A-Cath 05/06/2019  . Port-A-Cath in place 03/24/2019  . Postoperative visit 03/16/2019  . Encounter for postoperative wound check 10/28/2018  . Pleural effusion 10/13/2018  . Brain metastases ([place])  05/13/2018  . Primary malignant neoplasm of lung with metastasis to brain ([place]) 02/05/2018  . Cardiac/pericardial tamponade   . Chest tube in place   . Pain   . Acute blood loss anemia   . Palliative care encounter   . AKI (acute kidney injury) ([place]) 01/10/2017  . Hypotension 01/09/2017  . Essential hypertension 01/09/2017  . Hypokalemia 01/09/2017  . Dyspnea 01/09/2017  . Leukocytosis 01/09/2017  . Pleural effusion on right 01/08/2017  . Encounter for antineoplastic chemotherapy 11/20/2016  . Adenocarcinoma of left lung, stage 4 ([place]) 10/28/2016  . Goals of care, counseling/discussion 10/28/2016  . Upper airway cough syndrome 10/16/2016  . Multiple pulmonary nodules 10/15/2016    Past Surgical History:  Procedure Laterality Date  . ABDOMINAL HYSTERECTOMY     partial hysterectomy  . BREAST EXCISIONAL BIOPSY Left 20+ yrs ago   benign  . CHEST TUBE INSERTION Left 10/26/2018   Procedure: INSERTION PLEURAL DRAINAGE CATHETER;  Surgeon: Ivin Poot, MD;  Location: [facility];  Service: Thoracic;  Laterality: Left;  . COLONOSCOPY    . IR IMAGING GUIDED PORT INSERTION  07/04/2019  . PERICARDIAL WINDOW N/A 01/10/2017   Procedure: PERICARDIAL WINDOW- SUB XYPHOID, RIGHT CHEST TUBE;  Surgeon: Ivin Poot, MD;  Location: [facility];  Service: Thoracic;  Laterality: N/A;  . PORT-A-CATH REMOVAL Left 05/10/2019   Procedure: REMOVAL PORT-A-CATH;  Surgeon: Ivin Poot, MD;  Location: [facility];  Service: Thoracic;  Laterality: Left;  . PORTACATH PLACEMENT Left 02/21/2019   Procedure: INSERTION PORT-A-CATH;  Surgeon: Ivin Poot, MD;  Location: [facility];  Service: Thoracic;  Laterality: Left;  . REMOVAL OF PLEURAL DRAINAGE CATHETER Left 02/21/2019   Procedure: REMOVAL OF PLEURAL DRAINAGE CATHETER;  Surgeon: Ivin Poot, MD;  Location: [facility];  Service: Thoracic;  Laterality: Left;  Marland Kitchen VIDEO BRONCHOSCOPY Bilateral 10/21/2016   Procedure: VIDEO BRONCHOSCOPY WITH FLUORO;  Surgeon: Tanda Rockers,  MD;  [facility];  Service: Cardiopulmonary;  Laterality: Bilateral;     OB History   No obstetric history on file.     Family History  Problem Relation Age of Onset  . Asthma Mother   . Stroke Mother   . Hypertension Mother   . Heart attack Father   . Hypertension Father   . Hyperlipidemia Father   . Dementia Father   . Emphysema Maternal Grandmother   . Hypertension Maternal Grandmother   . Colon cancer Paternal Grandmother   . Brain cancer Maternal Uncle     Social History   Tobacco Use  . Smoking status: Never Smoker  . Smokeless tobacco: Never Used  Vaping Use  . Vaping Use: Never used  Substance Use Topics  . Alcohol use: Not Currently    Comment: socially  . Drug use: No    Home Medications Prior to Admission medications   Medication Sig Start Date End Date Taking? Authorizing Provider  albuterol (VENTOLIN HFA) 108 (90 Base) MCG/ACT inhaler Inhale 2 puffs into the lungs every 6 (six) hours as needed for wheezing or shortness of breath. 02/03/19  Yes Curt Bears, MD  clindamycin (CLINDAGEL) 1 % gel Apply 1 application topically 2 (two) times daily. 12/30/18  Yes Copland, Gay Filler, MD  dexamethasone (DECADRON) 4 MG tablet 1 tablet p.o. twice daily the day before, day of and day after chemotherapy every 3 weeks 10/04/19  Yes Curt Bears, MD  fentaNYL (DURAGESIC) 50 MCG/HR Place 1 patch onto the skin every 3 (three) days. 02/13/20  Yes Curt Bears, MD  fluticasone [facility]) 50 MCG/ACT nasal spray Place 2 sprays into both nostrils daily as needed for allergies. 05/24/19  Yes Copland, Gay Filler, MD  folic acid (FOLVITE) 1 MG tablet TAKE 1 TABLET BY MOUTH ONCE A DAY Patient taking differently: Take 1 mg by mouth daily. 12/27/19  Yes Curt Bears, MD  furosemide (LASIX) 40 MG tablet TAKE 1 TABLET BY MOUTH ONCE DAILY Patient taking differently: Take 40 mg by mouth daily. 12/27/19  Yes Copland, Gay Filler, MD  guaiFENesin (MUCINEX) 600 MG 12 hr  tablet Take 600 mg by mouth 2 (two) times daily as needed for to loosen phlegm.    Yes [provider]  HYDROcodone-homatropine (HYCODAN) 5-1.5 MG/5ML syrup Take 5 mLs by mouth every 6 (six) hours as needed for cough. 02/13/20  Yes Curt Bears, MD  lidocaine-prilocaine (EMLA) cream APPLY TO THE AFFECTED AREA(S) AS NEEDED Patient taking differently: Apply 1 application topically daily as needed (chemo therapy port). 08/24/19  Yes Curt Bears, MD  linaclotide [facility]) 145 MCG CAPS capsule Take 1 capsule (145 mcg total) by mouth daily before breakfast. 05/04/19  Yes Copland, Gay Filler, MD  loperamide (IMODIUM) 1 MG/5ML solution Take 10 mLs (2 mg total) by mouth as needed for diarrhea or loose stools. Patient taking differently: Take 2 mg by mouth daily as needed for diarrhea or loose stools. 03/24/19  Yes Curt Bears, MD  LORazepam (  ATIVAN) 1 MG tablet Take 1 tablet PO 30 min before radiation therapy and/or MRIs. 12/05/19  Yes Eppie Gibson, MD  magnesium oxide (MAG-OX) 400 (241.3 Mg) MG tablet Take 1 tablet (400 mg total) by mouth daily. 01/23/17  Yes Rama, Venetia Maxon, MD  methocarbamol (ROBAXIN) 500 MG tablet TAKE 1 TABLET (500 MG TOTAL) BY MOUTH EVERY 8 (EIGHT) HOURS AS NEEDED FOR MUSCLE SPASMS. 12/02/18  Yes Copland, Gay Filler, MD  nystatin cream (MYCOSTATIN) APPLY 1 APPLICATION TOPICALLY 2 (TWO) TIMES DAILY. Patient taking differently: Apply 1 application topically daily as needed (skin irritaion). 05/05/19  Yes Copland, Gay Filler, MD  ondansetron (ZOFRAN) 8 MG tablet TAKE 1 TABLET BY MOUTH EVERY 8 HOURS AS NEEDED FOR NAUSEA OR VOMITING Patient taking differently: Take 8 mg by mouth every 8 (eight) hours as needed for nausea or vomiting. 04/15/19  Yes Heilingoetter, Cassandra L, PA-C  osimertinib mesylate (TAGRISSO) 80 MG tablet Take 80 mg by mouth daily.   Yes [provider]  oxyCODONE (OXY IR/ROXICODONE) 5 MG immediate release tablet TAKE 1 TABLET BY MOUTH EVERY 6 HOURS  AS NEEDED FOR PAIN Patient taking differently: Take 5 mg by mouth every 6 (six) hours as needed for moderate pain. 01/13/20  Yes Heilingoetter, Cassandra L, PA-C  pantoprazole (PROTONIX) 40 MG tablet Take 1 tablet (40 mg total) by mouth daily. 09/14/19  Yes Copland, Gay Filler, MD  potassium chloride SA (KLOR-CON) 20 MEQ tablet Take 1 tablet (20 mEq total) by mouth 2 (two) times daily. 09/14/19  Yes Copland, Gay Filler, MD  prochlorperazine (COMPAZINE) 10 MG tablet Take 1 tablet (10 mg total) by mouth every 6 (six) hours as needed for nausea or vomiting. 12/21/19  Yes Curt Bears, MD  doxycycline (VIBRA-TABS) 100 MG tablet Take 1 tablet (100 mg total) by mouth 2 (two) times daily. Patient not taking: Reported on 02/18/2020 11/17/19   Copland, Gay Filler, MD  methylPREDNISolone (MEDROL DOSEPAK) 4 MG TBPK tablet Use as instructed.  Start Monday, January 16, 2020. Patient not taking: Reported on 02/18/2020 01/10/20   Curt Bears, MD  olaparib [facility]) 150 MG tablet Take 2 tablets (300 mg total) by mouth 2 (two) times daily. Swallow whole. May take with food to decrease nausea and vomiting. Patient not taking: No sig reported 12/01/19   Curt Bears, MD  senna-docusate (SENOKOT-S) 8.6-50 MG tablet Take 1 tablet by mouth at bedtime. Patient not taking: Reported on 02/18/2020 01/22/17   Rama, Venetia Maxon, MD  traMADol (ULTRAM) 50 MG tablet Take 1 tablet (50 mg total) by mouth every 6 (six) hours as needed. Patient not taking: No sig reported 05/10/19   Ivin Poot, MD    Allergies    Percocet [oxycodone-acetaminophen], Lisinopril, and Losartan  Review of Systems   Review of Systems  Constitutional: Positive for fever. Negative for chills, diaphoresis and fatigue.  HENT: Negative for congestion, sore throat and trouble swallowing.   Eyes: Negative for pain and visual disturbance.  Respiratory: Positive for cough and shortness of breath. Negative for wheezing.   Cardiovascular: Negative  for chest pain, palpitations and leg swelling.  Gastrointestinal: Negative for abdominal distention, abdominal pain, diarrhea, nausea and vomiting.  Genitourinary: Negative for difficulty urinating.  Musculoskeletal: Negative for back pain, neck pain and neck stiffness.  Skin: Negative for pallor.  Neurological: Negative for dizziness, speech difficulty, weakness and headaches.  Psychiatric/Behavioral: Negative for confusion.    Physical Exam Updated Vital Signs BP 129/77 (BP Location: Left Arm)   Pulse 93  Temp (!) 100.7 F (38.2 C) (Rectal)   Resp 20   LMP 03/15/2010   SpO2 96%   Physical Exam Constitutional:      General: She is not in acute distress.    Appearance: Normal appearance. She is ill-appearing. She is not toxic-appearing or diaphoretic.     Comments: Chronically ill-appearing 56 year old, appears older than stated age.  Is requiring 2 L of oxygen at this time.  Is able to speak to me in full sentences  HENT:     Head: Normocephalic and atraumatic.     Mouth/Throat:     Mouth: Mucous membranes are moist.     Pharynx: Oropharynx is clear.  Eyes:     General: No scleral icterus.    Extraocular Movements: Extraocular movements intact.     Pupils: Pupils are equal, round, and reactive to light.  Cardiovascular:     Rate and Rhythm: Regular rhythm. Tachycardia present.     Pulses: Normal pulses.     Heart sounds: Normal heart sounds.  Pulmonary:     Effort: No respiratory distress.     Breath sounds: No stridor. Rales present. No wheezing or rhonchi.  Chest:     Chest wall: No tenderness.  Abdominal:     General: Abdomen is flat. There is no distension.     Palpations: Abdomen is soft.     Tenderness: There is no abdominal tenderness. There is no right CVA tenderness, left CVA tenderness, guarding or rebound.  Genitourinary:    Comments: Chaperone present. Digital Rectal exam reveals sphincter with good tone. No external hemorrhoids, masses, or fissures. Stool  color is brown with no overt blood. No gross melena.   Musculoskeletal:        General: No swelling or tenderness. Normal range of motion.     Cervical back: Normal range of motion and neck supple. No rigidity.     Right lower leg: No edema.     Left lower leg: No edema.  Skin:    General: Skin is warm and dry.     Capillary Refill: Capillary refill takes less than 2 seconds.     Coloration: Skin is not pale.  Neurological:     General: No focal deficit present.     Mental Status: She is alert and oriented to person, place, and time.     Cranial Nerves: No cranial nerve deficit.     Sensory: No sensory deficit.     Motor: No weakness.     Coordination: Coordination normal.  Psychiatric:        Mood and Affect: Mood normal.        Behavior: Behavior normal.     ED Results / Procedures / Treatments   Labs (all labs ordered are listed, but only abnormal results are displayed) Labs Reviewed  COMPREHENSIVE METABOLIC PANEL - Abnormal; Notable for the following components:      Result Value   Sodium 132 (*)    Potassium 2.7 (*)    Chloride 93 (*)    Glucose, Bld 168 (*)    Albumin 3.0 (*)    AST 42 (*)    All other components within normal limits  CBC WITH DIFFERENTIAL/PLATELET - Abnormal; Notable for the following components:   WBC 14.7 (*)    RBC 2.37 (*)    Hemoglobin 7.9 (*)    HCT 24.7 (*)    MCV 104.2 (*)    RDW 23.8 (*)    nRBC 0.5 (*)  Neutro Abs 11.5 (*)    Monocytes Absolute 2.0 (*)    Abs Immature Granulocytes 0.23 (*)    All other components within normal limits  PROTIME-INR - Abnormal; Notable for the following components:   Prothrombin Time 16.7 (*)    INR 1.4 (*)    All other components within normal limits  SARS CORONAVIRUS 2 BY RT PCR (HOSPITAL ORDER, [facility])  CULTURE, BLOOD (ROUTINE X 2)  CULTURE, BLOOD (ROUTINE X 2)  LACTIC ACID, PLASMA  LACTIC ACID, PLASMA  URINALYSIS, ROUTINE W REFLEX MICROSCOPIC  I-STAT BETA  HCG BLOOD, ED ([facility], [facility], [facility] ONLY)  POC OCCULT BLOOD, ED    EKG None  Radiology DG Chest Port 1 View  Result Date: 02/18/2020 CLINICAL DATA:  Stage IV lung cancer EXAM: PORTABLE CHEST 1 VIEW COMPARISON:  CT chest dated 02/07/2020 FINDINGS: Patchy/masslike opacity in the left lower lobe with numerous bilateral pulmonary nodules, better evaluated on prior CT. Suspected small left pleural effusion. No pneumothorax. Cardiomegaly. Right chest power port terminates at the cavoatrial junction. IMPRESSION: Patchy/masslike opacity in the left lower lobe, better evaluated on prior CT. Numerous bilateral pulmonary metastases. Suspected small left pleural effusion. Electronically Signed   By: Julian Hy M.D.   On: 02/18/2020 16:15    Procedures .Critical Care Performed by: Alfredia Client, PA-C Authorized by: Alfredia Client, PA-C   Critical care provider statement:    Critical care time (minutes):  45   Critical care was necessary to treat or prevent imminent or life-threatening deterioration of the following conditions:  Sepsis   Critical care was time spent personally by me on the following activities:  Discussions with consultants, evaluation of patient's response to treatment, examination of patient, ordering and performing treatments and interventions, ordering and review of laboratory studies, ordering and review of radiographic studies, pulse oximetry, re-evaluation of patient's condition, obtaining history from patient or surrogate and review of old charts   (including critical care time)  Medications Ordered in ED Medications  lactated ringers infusion ( Intravenous New Bag/Given 02/18/20 1657)  cefTRIAXone (ROCEPHIN) 2 g in sodium chloride 0.9 % 100 mL IVPB (0 g Intravenous Stopped 02/18/20 1648)  azithromycin (ZITHROMAX) 500 mg in sodium chloride 0.9 % 250 mL IVPB (500 mg Intravenous New Bag/Given 02/18/20 1656)  potassium chloride 10 mEq in 100 mL IVPB (10 mEq Intravenous New Bag/Given  02/18/20 1655)  lactated ringers bolus 1,000 mL (0 mLs Intravenous Stopped 02/18/20 1643)    And  lactated ringers bolus 1,000 mL (0 mLs Intravenous Stopped 02/18/20 1643)    And  lactated ringers bolus 500 mL (0 mLs Intravenous Stopped 02/18/20 1643)    ED Course  I have reviewed the triage vital signs and the nursing notes.  Pertinent labs & imaging results that were available during my care of the patient were reviewed by me and considered in my medical decision making (see chart for details).    MDM Rules/Calculators/A&P                         Nancy Moore is a 55 y.o. female with pertinent past medical history of stage IV non-small cell lung cancer followed by Dr. Earlie Server on oral chemotherapy, hypertension, arthritis, anemia that presents to the emergency department today via EMS for fever. Code sepsis initiated.  Patient is tachycardic to 118, patient is on 2 L of oxygen.  When taken off she desats to 89%.  Patient is not  normally on oxygen at home.  Patient appears chronically ill, concern for lung etiology at this time.  Will initiate Antibiotics and obtain basic work-up including chest x-ray and sepsis order set.  Patient will need to be admitted.   Work-up today shows CBC with white count of 14.7, hemoglobin of 7.9.  This is slightly lower than patient's baseline, fecal occult negative.  CMP does show potassium of 2.7, did replete this here in the ED.  Lactic acid 1.8.  COVID-negative.  Chest x-ray does show patchy opacity in the left lower lobe, does appear as if this has been present in prior CT.  Will need CT to further evaluate this.  Upon reevaluation, patient was taken off oxygen, remains above 94%.  Appears better, tachycardia has resolved. I still think patient will need to be admitted.  Patient agreeable.  Pt care was handed off to Dr. Roderic Palau Complete history and physical and current plan have been communicated.  Please refer to their note for the remainder of ED care and  ultimate disposition. Awaiting CT.   I discussed this case with my attending physician who cosigned this note including patient's presenting symptoms, physical exam, and planned diagnostics and interventions. Attending physician stated agreement with plan or made changes to plan which were implemented.   Attending physician assessed patient at bedside.  Final Clinical Impression(s) / ED Diagnoses Final diagnoses:  Sepsis without acute organ dysfunction, due to unspecified organism [facility])    Rx / [place] Orders ED Discharge Orders    None       Alfredia Client, PA-C 02/18/20 1734    Milton Ferguson, MD 02/18/20 2220

## 2020-02-18 NOTE — Progress Notes (Signed)
ANTICOAGULATION CONSULT NOTE - Initial Consult  Pharmacy Consult for Xarelto Indication: pulmonary embolus  Allergies  Allergen Reactions  . Percocet [Oxycodone-Acetaminophen] Other (See Comments)    "makes me dizzy" / takes TID at home  . Lisinopril Cough  . Losartan Cough    Patient Measurements:   Heparin Dosing Weight:   Vital Signs: Temp: 100.7 F (38.2 C) (01/22 1616) Temp Source: Rectal (01/22 1616) BP: 125/74 (01/22 1900) Pulse Rate: 95 (01/22 1900)  Labs: Recent Labs    02/18/20 1527  HGB 7.9*  HCT 24.7*  PLT 260  LABPROT 16.7*  INR 1.4*  CREATININE 0.99    CrCl cannot be calculated (Unknown ideal weight.).   Medical History: Past Medical History:  Diagnosis Date  . Adenocarcinoma of left lung, stage 4 ([place]) 10/28/2016  . Anemia   . Arthritis   . Constipation   . Crohn's disease ([place])    in remission 35 years +  . Dyspnea    gets "winded"  . GERD (gastroesophageal reflux disease)   . Goals of care, counseling/discussion 10/28/2016  . History of hiatal hernia    noted on CT 12/20  . Hypertension   . Pneumonia    walking pneumonia in the late 80's  . Recurrent pleural effusion on left      Assessment: 55 yo F with NSCLC and new PE.  CTA: single segment RUL PE, no R heart strain.  Baseline INR 1.4, Hg 7.6, PLT 260, SCr 0.99  Goal of Therapy:  Monitor platelets by anticoagulation protocol: Yes   Plan:  Xarelto 15 mg po bid with meals x 21 days followed by xarelto 20 mg qsupper to start Sunday 03/11/2020 Will provide education & 30 day free card prior to discharge  Eudelia Bunch, Pharm.D 02/18/2020 7:34 PM

## 2020-02-19 ENCOUNTER — Observation Stay (HOSPITAL_COMMUNITY): Payer: Medicare Other

## 2020-02-19 DIAGNOSIS — G8929 Other chronic pain: Secondary | ICD-10-CM | POA: Diagnosis present

## 2020-02-19 DIAGNOSIS — K5090 Crohn's disease, unspecified, without complications: Secondary | ICD-10-CM | POA: Diagnosis present

## 2020-02-19 DIAGNOSIS — I2699 Other pulmonary embolism without acute cor pulmonale: Secondary | ICD-10-CM | POA: Diagnosis present

## 2020-02-19 DIAGNOSIS — I10 Essential (primary) hypertension: Secondary | ICD-10-CM | POA: Diagnosis present

## 2020-02-19 DIAGNOSIS — R0602 Shortness of breath: Secondary | ICD-10-CM

## 2020-02-19 DIAGNOSIS — K5900 Constipation, unspecified: Secondary | ICD-10-CM | POA: Diagnosis not present

## 2020-02-19 DIAGNOSIS — C787 Secondary malignant neoplasm of liver and intrahepatic bile duct: Secondary | ICD-10-CM | POA: Diagnosis present

## 2020-02-19 DIAGNOSIS — E876 Hypokalemia: Secondary | ICD-10-CM | POA: Diagnosis present

## 2020-02-19 DIAGNOSIS — C7931 Secondary malignant neoplasm of brain: Secondary | ICD-10-CM | POA: Diagnosis present

## 2020-02-19 DIAGNOSIS — Z66 Do not resuscitate: Secondary | ICD-10-CM | POA: Diagnosis present

## 2020-02-19 DIAGNOSIS — Z90710 Acquired absence of both cervix and uterus: Secondary | ICD-10-CM | POA: Diagnosis not present

## 2020-02-19 DIAGNOSIS — R6510 Systemic inflammatory response syndrome (SIRS) of non-infectious origin without acute organ dysfunction: Secondary | ICD-10-CM | POA: Diagnosis not present

## 2020-02-19 DIAGNOSIS — Z888 Allergy status to other drugs, medicaments and biological substances status: Secondary | ICD-10-CM | POA: Diagnosis not present

## 2020-02-19 DIAGNOSIS — D638 Anemia in other chronic diseases classified elsewhere: Secondary | ICD-10-CM | POA: Diagnosis present

## 2020-02-19 DIAGNOSIS — C3492 Malignant neoplasm of unspecified part of left bronchus or lung: Secondary | ICD-10-CM | POA: Diagnosis not present

## 2020-02-19 DIAGNOSIS — C7951 Secondary malignant neoplasm of bone: Secondary | ICD-10-CM | POA: Diagnosis present

## 2020-02-19 DIAGNOSIS — K219 Gastro-esophageal reflux disease without esophagitis: Secondary | ICD-10-CM | POA: Diagnosis present

## 2020-02-19 DIAGNOSIS — Z8249 Family history of ischemic heart disease and other diseases of the circulatory system: Secondary | ICD-10-CM | POA: Diagnosis not present

## 2020-02-19 DIAGNOSIS — Z20822 Contact with and (suspected) exposure to covid-19: Secondary | ICD-10-CM | POA: Diagnosis present

## 2020-02-19 DIAGNOSIS — Z79899 Other long term (current) drug therapy: Secondary | ICD-10-CM | POA: Diagnosis not present

## 2020-02-19 DIAGNOSIS — Z885 Allergy status to narcotic agent status: Secondary | ICD-10-CM | POA: Diagnosis not present

## 2020-02-19 DIAGNOSIS — I2690 Septic pulmonary embolism without acute cor pulmonale: Secondary | ICD-10-CM | POA: Diagnosis not present

## 2020-02-19 DIAGNOSIS — C3432 Malignant neoplasm of lower lobe, left bronchus or lung: Secondary | ICD-10-CM | POA: Diagnosis present

## 2020-02-19 DIAGNOSIS — M549 Dorsalgia, unspecified: Secondary | ICD-10-CM | POA: Diagnosis present

## 2020-02-19 LAB — BASIC METABOLIC PANEL
Anion gap: 8 (ref 5–15)
BUN: 6 mg/dL (ref 6–20)
CO2: 27 mmol/L (ref 22–32)
Calcium: 8.9 mg/dL (ref 8.9–10.3)
Chloride: 99 mmol/L (ref 98–111)
Creatinine, Ser: 0.84 mg/dL (ref 0.44–1.00)
GFR, Estimated: >60 mL/min (ref >60)
Glucose, Bld: 111 mg/dL — ABNORMAL HIGH (ref 70–99)
Potassium: 3.5 mmol/L (ref 3.5–5.1)
Sodium: 134 mmol/L — ABNORMAL LOW (ref 135–145)

## 2020-02-19 LAB — ECHOCARDIOGRAM COMPLETE
Area-P 1/2: 4.36 cm2
Height: 66 in
S' Lateral: 3.3 cm
Weight: 2677.27 oz

## 2020-02-19 LAB — HIV ANTIBODY (ROUTINE TESTING W REFLEX): HIV Screen 4th Generation wRfx: NONREACTIVE

## 2020-02-19 LAB — CBC
HCT: 22.3 % — ABNORMAL LOW (ref 36.0–46.0)
Hemoglobin: 7 g/dL — ABNORMAL LOW (ref 12.0–15.0)
MCH: 33.3 pg (ref 26.0–34.0)
MCHC: 31.4 g/dL (ref 30.0–36.0)
MCV: 106.2 fL — ABNORMAL HIGH (ref 80.0–100.0)
Platelets: 235 10*3/uL (ref 150–400)
RBC: 2.1 MIL/uL — ABNORMAL LOW (ref 3.87–5.11)
RDW: 23.9 % — ABNORMAL HIGH (ref 11.5–15.5)
WBC: 13.3 10*3/uL — ABNORMAL HIGH (ref 4.0–10.5)
nRBC: 0.3 % — ABNORMAL HIGH (ref 0.0–0.2)

## 2020-02-19 LAB — MAGNESIUM: Magnesium: 1.6 mg/dL — ABNORMAL LOW (ref 1.7–2.4)

## 2020-02-19 LAB — PROCALCITONIN: Procalcitonin: 1.02 ng/mL

## 2020-02-19 MED ORDER — CHLORHEXIDINE GLUCONATE CLOTH 2 % EX PADS
6.0000 | MEDICATED_PAD | Freq: Every day | CUTANEOUS | Status: DC
  Administered 2020-02-19 – 2020-02-22 (×4): 6 via TOPICAL

## 2020-02-19 MED ORDER — POTASSIUM CHLORIDE 10 MEQ/100ML IV SOLN
INTRAVENOUS | Status: AC
  Filled 2020-02-19: qty 100

## 2020-02-19 MED ORDER — SODIUM CHLORIDE 0.9% FLUSH: 10.0000 mL | Freq: Two times a day (BID) | INTRAVENOUS | Status: DC

## 2020-02-19 MED ORDER — BOOST / RESOURCE BREEZE PO LIQD CUSTOM
1.0000 | Freq: Three times a day (TID) | ORAL | Status: DC
  Administered 2020-02-19 – 2020-02-22 (×8): 1 via ORAL
  Filled 2020-02-19: qty 1

## 2020-02-19 MED ORDER — SODIUM CHLORIDE 0.9% FLUSH
10.0000 mL | INTRAVENOUS | Status: DC | PRN
Start: 2020-02-19 — End: 2020-02-22

## 2020-02-19 NOTE — Progress Notes (Signed)
  Echocardiogram 2D Echocardiogram has been performed.  Jennette Dubin 02/19/2020, 11:28 AM

## 2020-02-19 NOTE — Progress Notes (Signed)
Bilateral lower extremity venous study completed.   Results given to RN   Please see CV Proc for preliminary results.   Vonzell Schlatter, RVT

## 2020-02-19 NOTE — Progress Notes (Signed)
HEMATOLOGY-ONCOLOGY PROGRESS NOTE  SUBJECTIVE: Patient with a diagnosis of metastatic non-small cell lung cancer poorly differentiated adenocarcinoma with extensive lymphangitic spread in the lungs who was previously treated with Tagrisso and systemic chemotherapy with carboplatin Alimta and Avastin along with Tagrisso and most recently started on olaparib December 08, 2019 under the care of Dr. Julien Nordmann.  When there was evidence of progression this was changed to Nancy Moore.  She was admitted to the hospital yesterday with complaints of fever with some shortness of breath.  She underwent a CT chest PE protocol and was detected to have a small pulmonary embolism.  She was admitted for concern for infection and PE.  She was started on Xarelto.  Oncology History  Primary malignant neoplasm of lung with metastasis to brain ([place])  02/05/2018 Initial Diagnosis   Primary malignant neoplasm of lung with metastasis to brain ([place])   02/11/2019 -  Chemotherapy   Carboplatin, Alimta and Avastin along with Tagrisso  SBRT to brain mets  OBJECTIVE: REVIEW OF SYSTEMS:   Shortness of breath at rest  PHYSICAL EXAMINATION: ECOG PERFORMANCE STATUS: 2 - Symptomatic, <50% confined to bed  Vitals:   02/19/20 0328 02/19/20 0746  BP: 118/74 109/69  Pulse: (!) 102 (!) 102  Resp: 19 16  Temp: 98.7 F (37.1 C) 99.5 F (37.5 C)  SpO2: 98% 90%   Filed Weights   02/19/20 0404  Weight: 167 lb 5.3 oz (75.9 kg)    GENERAL:alert, no distress and comfortable SKIN: skin color, texture, turgor are normal, no rashes or significant lesions EYES: normal, Conjunctiva are pink and non-injected, sclera clear OROPHARYNX:no exudate, no erythema and lips, buccal mucosa, and tongue normal  NECK: supple, thyroid normal size, non-tender, without nodularity LYMPH:  no palpable lymphadenopathy in the cervical, axillary or inguinal LUNGS: Decreased breath sounds bilaterally HEART: regular rate & rhythm and no murmurs and no  lower extremity edema ABDOMEN:abdomen soft, non-tender and normal bowel sounds Musculoskeletal:no cyanosis of digits and no clubbing  NEURO: alert & oriented x 3 with fluent speech, no focal motor/sensory deficits  LABORATORY DATA:  I have reviewed the data as listed CMP Latest Ref Rng & Units 02/19/2020 02/18/2020 02/07/2020  Glucose 70 - 99 mg/dL 111(H) 168(H) 97  BUN 6 - 20 mg/dL 6 9 8   Creatinine 0.44 - 1.00 mg/dL 0.84 0.99 1.09(H)  Sodium 135 - 145 mmol/L 134(L) 132(L) 136  Potassium 3.5 - 5.1 mmol/L 3.5 2.7(LL) 3.7  Chloride 98 - 111 mmol/L 99 93(L) 96(L)  CO2 22 - 32 mmol/L 27 26 28   Calcium 8.9 - 10.3 mg/dL 8.9 8.9 10.4(H)  Total Protein 6.5 - 8.1 g/dL - 7.0 7.7  Total Bilirubin 0.3 - 1.2 mg/dL - 0.6 0.7  Alkaline Phos 38 - 126 U/L - 67 82  AST 15 - 41 U/L - 42(H) 16  ALT 0 - 44 U/L - 20 <6    Lab Results  Component Value Date   WBC 13.3 (H) 02/19/2020   HGB 7.0 (L) 02/19/2020   HCT 22.3 (L) 02/19/2020   MCV 106.2 (H) 02/19/2020   PLT 235 02/19/2020   NEUTROABS 11.5 (H) 02/18/2020    ASSESSMENT AND PLAN: 1.  Metastatic lung cancer with lymphangitic spread along with liver metastases: Currently on treatment with Tagrisso.  She is under the care of Dr. Julien Nordmann. CT angiogram 02/18/2020: Single segmental right upper lobe pulmonary embolism.  Diffuse metastatic disease bilateral lung nodules unchanged from before.  Slight enlargement of AP window lymph node with mass-effect on  trachea.  Slight increase in the right lower lobe liver metastases from 1.7 to 2 cm.  Stable bone metastatic disease.  Dr. Julien Nordmann would be able to see the patient tomorrow to direct further plan of care regarding metastatic lung cancer 2. pulmonary embolism: On Xarelto 3. currently on antibiotics with suspicion of infection. 4. severe anemia hemoglobin of 7: Could use blood transfusion.  She wants to see what the labs would be tomorrow before agreeing to it.

## 2020-02-19 NOTE — Progress Notes (Signed)
PHARMACY - TAGRISSO (Osimertinib)  Per [employer] Policy, Tagrisso has been held due to noted fever.  Medication will be held until physician review occurs per policy for appropriateness in resuming.  Leone Haven, PharmD

## 2020-02-19 NOTE — ED Notes (Signed)
Report attempted to 4E. RN to call back when she is able to take report

## 2020-02-19 NOTE — Evaluation (Signed)
Physical Therapy Evaluation Patient Details Name: Nancy Moore MRN: [id number] DOB: 10-05-1965 Today's Date: 02/19/2020   History of Present Illness  55 y.o. female with pertinent past medical history of stage IV non-small cell lung cancer  on oral chemotherapy, hypertension, arthritis, anemia that presents to the emergency department today via EMS for fever.  Clinical Impression  Pt admitted with above diagnosis.  Pt very sleepy, however pleasant and agreeable to begin mobilizing. Limited to transfers only d/t fatigue. Min to min/guard  assist overall. Recommend HHPT, pt has supportive family that can provide 24hr assist if needed. Will continue to follow in acute setting.  Pt currently with functional limitations due to the deficits listed below (see PT Problem List). Pt will benefit from skilled PT to increase their independence and safety with mobility to allow discharge to the venue listed below.       Follow Up Recommendations Home health PT    Equipment Recommendations  Rolling walker with 5" wheels (may borrow RW -?)    Recommendations for Other Services       Precautions / Restrictions Precautions Precautions: Fall Restrictions Weight Bearing Restrictions: No      Mobility  Bed Mobility Overal bed mobility: Needs Assistance Bed Mobility: Supine to Sit     Supine to sit: HOB elevated;Min guard     General bed mobility comments: incr time, min/guard for safety    Transfers Overall transfer level: Needs assistance Equipment used: Rolling walker (2 wheeled) Transfers: Sit to/from [facility] Sit to Stand: Min assist         General transfer comment: assist to rise and stabilize, to steady during pivot. repetitious multi-modal cues for hand placement and overall safety. fatigues quickly  Ambulation/Gait             General Gait Details: unable d/t fatique/dizziness  Stairs            Wheelchair Mobility    Modified Rankin  (Stroke Patients Only)       Balance Overall balance assessment: Needs assistance   Sitting balance-Leahy Scale: Fair       Standing balance-Leahy Scale: Poor Standing balance comment: reliant on UEs                             Pertinent Vitals/Pain Pain Assessment: No/denies pain    Home Living Family/patient expects to be discharged to:: Private residence Living Arrangements: Alone Available Help at Discharge: Family;Available 24 hours/day Type of Home: Apartment (handicap accesible) Home Access: Ramped entrance   Entrance Stairs-Number of Steps: threshold Home Layout: One level Home Equipment: None Additional Comments: sister states family is working on arranging 24/7 assist, plannning to put an extra bed in pt's one bedroom apt.    Prior Function Level of Independence: Independent         Comments: sister/family can provide 24/7 assist.     Hand Dominance        Extremity/Trunk Assessment   Upper Extremity Assessment Upper Extremity Assessment: Generalized weakness    Lower Extremity Assessment Lower Extremity Assessment: Generalized weakness       Communication   Communication: No difficulties  Cognition Arousal/Alertness: Awake/alert (sleepy, falls asleep repeatedly but arouses to voice) Behavior During Therapy: WFL for tasks assessed/performed Overall Cognitive Status: Within Functional Limits for tasks assessed  General Comments      Exercises General Exercises - Lower Extremity Ankle Circles/Pumps: AROM;Both;5 reps   Assessment/Plan    PT Assessment Patient needs continued PT services  PT Problem List Decreased strength;Decreased range of motion;Decreased activity tolerance;Decreased mobility;Decreased knowledge of use of DME;Decreased safety awareness;Cardiopulmonary status limiting activity       PT Treatment Interventions DME instruction;Therapeutic activities;Gait  training;Therapeutic exercise;Patient/family education;Balance training;Functional mobility training    PT Goals (Current goals can be found in the Care Plan section)  Acute Rehab PT Goals Patient Stated Goal: stronger PT Goal Formulation: With patient Time For Goal Achievement: 03/04/20 Potential to Achieve Goals: Good    Frequency Min 3X/week   Barriers to discharge        Co-evaluation               AM-PAC PT "6 Clicks" Mobility  Outcome Measure Help needed turning from your back to your side while in a flat bed without using bedrails?: A Little Help needed moving from lying on your back to sitting on the side of a flat bed without using bedrails?: A Little Help needed moving to and from a bed to a chair (including a wheelchair)?: A Little Help needed standing up from a chair using your arms (e.g., wheelchair or bedside chair)?: A Little Help needed to walk in hospital room?: A Lot Help needed climbing 3-5 steps with a railing? : A Lot 6 Click Score: 16    End of Session Equipment Utilized During Treatment: Gait belt Activity Tolerance: Patient limited by fatigue Patient left: in chair;with call bell/phone within reach;with family/visitor present;with chair alarm set Nurse Communication: Mobility status PT Visit Diagnosis: Other abnormalities of gait and mobility (R26.89)    Time: 1020-1045 PT Time Calculation (min) (ACUTE ONLY): 25 min   Charges:   PT Evaluation $PT Eval Low Complexity: 1 Low PT Treatments $Therapeutic Activity: 8-22 mins        Baxter Flattery, PT  Acute Rehab Dept ([place]) [phone] Pager [phone]  02/19/2020   [facility] 02/19/2020, 1:45 PM

## 2020-02-19 NOTE — Progress Notes (Signed)
PROGRESS NOTE    Nancy Moore  SFK:[id number] DOB: May 24, 1965 DOA: 02/18/2020 PCP: Darreld Mclean, MD   Brief Narrative: Nancy Moore is a 55 y.o. female with a history of stage 4 NSCLC with metastasis to brain, lung and liver. Patient presented secondary to fever but while in the ED, was found to have an acute PE. Patient started on anticoagulation.   Assessment & Plan:   Principal Problem:   Acute pulmonary embolism (HCC) Active Problems:   Adenocarcinoma of left lung, stage 4 (HCC)   Primary malignant neoplasm of lung with metastasis to brain (HCC)   Anemia   SIRS (systemic inflammatory response syndrome) (HCC)  Acute pulmonary embolism Acute Left LE DVT In setting of underlying metastatic lung cancer. Started on Xarelto per hematology recommendations. No heart strain on CT scan. Transthoracic Echocardiogram significant for normal RV function. -Continue Xarelto  SIRS This appears to be at least in part secondary to acute PE. Patient with fever but no infectious source. Blood and urine cultures obtained. -Follow up cultures  Non-small cell lung cancer Metastasis to brain, liver, lung Previously on chemotherapy, now on directed therapy on Tagrisso. Follows with Dr. Julien Nordmann as an outpatient. Oncology with no recommendations with regard to cancer today; recommendations to await for patient's primary oncologist tomorrow  Hypokalemia Potassium of 2.7 on admission. Resolved with repletion.  Acute on chronic anemia anemia Unsure of why she has acute anemia. She does have many metastatic lesions. No overt evidence of bleeding. Discussed blood transfusion but patient would like to wait prior to agreeing. -CBC this afternoon and repeat tomorrow morning  Chronic pain -Continue Fentanyl patch, Oxycodone prn   DVT prophylaxis: Xarelto Code Status:   Code Status: DNR Family Communication: Sister on telephone (10 minutes) Disposition Plan: Discharge home likely in 1-2 days  pending stable hemoglobin, oncology recommendations   Consultants:   Hematology/Oncology  Procedures:   TRANSTHORACIC ECHOCARDIOGRAM (02/19/2020) IMPRESSIONS    1. There is a false tendon in the mid LV cavity of no clinical  significance. . Left ventricular ejection fraction, by estimation, is 60  to 65%. The left ventricle has normal function. The left ventricle has no  regional wall motion abnormalities. Left  ventricular diastolic parameters were normal.  2. Right ventricular systolic function is normal. The right ventricular  size is normal. Tricuspid regurgitation signal is inadequate for assessing  PA pressure.  3. A small pericardial effusion is present. The pericardial effusion is  localized mainly near the right atrium but also circumferential.  4. The mitral valve is normal in structure. Mild mitral valve  regurgitation. No evidence of mitral stenosis.  5. The aortic valve is normal in structure. Aortic valve regurgitation is  trivial. No aortic stenosis is present.  6. The inferior vena cava is normal in size with greater than 50%  respiratory variability, suggesting right atrial pressure of 3 mmHg.  Antimicrobials:  None    Subjective: Some dyspnea when sitting up. No other concerns. States she will think about consenting to blood transfusions.  Objective: Vitals:   02/19/20 0009 02/19/20 0328 02/19/20 0404 02/19/20 0746  BP: 112/75 118/74  109/69  Pulse: (!) 103 (!) 102  (!) 102  Resp: (!) 23 19  16   Temp: 98.8 F (37.1 C) 98.7 F (37.1 C)  99.5 F (37.5 C)  TempSrc: Oral Oral  Oral  SpO2: 98% 98%  90%  Weight:   75.9 kg   Height:   5' 6"  (1.676 m)  Intake/Output Summary (Last 24 hours) at 02/19/2020 0830 Last data filed at 02/19/2020 0400 Gross per 24 hour  Intake 331.86 ml  Output -  Net 331.86 ml   Filed Weights   02/19/20 0404  Weight: 75.9 kg    Examination:  General exam: Appears calm and comfortable Respiratory system:  Diffuse rales. Diminished in RLL. Respiratory effort normal. Cardiovascular system: S1 & S2 heard, RRR. No murmurs, rubs, gallops or clicks. Gastrointestinal system: Abdomen is mildly distended, soft and nontender. No organomegaly or masses felt. Normal bowel sounds heard. Central nervous system: Alert and oriented. No focal neurological deficits. Musculoskeletal: No edema. No calf tenderness Skin: No cyanosis. No rashes Psychiatry: Judgement and insight appear normal. Mood & affect appropriate.     Data Reviewed: I have personally reviewed following labs and imaging studies  CBC Lab Results  Component Value Date   WBC 13.3 (H) 02/19/2020   RBC 2.10 (L) 02/19/2020   HGB 7.0 (L) 02/19/2020   HCT 22.3 (L) 02/19/2020   MCV 106.2 (H) 02/19/2020   MCH 33.3 02/19/2020   PLT 235 02/19/2020   MCHC 31.4 02/19/2020   RDW 23.9 (H) 02/19/2020   LYMPHSABS 0.9 02/18/2020   MONOABS 2.0 (H) 02/18/2020   EOSABS 0.0 02/18/2020   BASOSABS 0.1 02/72/5366     Last metabolic panel Lab Results  Component Value Date   NA 134 (L) 02/19/2020   K 3.5 02/19/2020   CL 99 02/19/2020   CO2 27 02/19/2020   BUN 6 02/19/2020   CREATININE 0.84 02/19/2020   GLUCOSE 111 (H) 02/19/2020   GFRNONAA >60 02/19/2020   GFRAA >60 10/26/2019   CALCIUM 8.9 02/19/2020   PHOS 4.1 10/14/2018   PROT 7.0 02/18/2020   ALBUMIN 3.0 (L) 02/18/2020   BILITOT 0.6 02/18/2020   ALKPHOS 67 02/18/2020   AST 42 (H) 02/18/2020   ALT 20 02/18/2020   ANIONGAP 8 02/19/2020    CBG (last 3)  No results for input(s): GLUCAP in the last 72 hours.   GFR: Estimated Creatinine Clearance: 80.6 mL/min (by C-G formula based on SCr of 0.84 mg/dL).  Coagulation Profile: Recent Labs  Lab 02/18/20 1527  INR 1.4*    Recent Results (from the past 240 hour(s))  SARS Coronavirus 2 by RT PCR (hospital order, performed in [facility] hospital lab) Nasopharyngeal Nasopharyngeal Swab     Status: None   Collection Time: 02/18/20  3:41  PM   Specimen: Nasopharyngeal Swab  Result Value Ref Range Status   SARS Coronavirus 2 NEGATIVE NEGATIVE Final    Comment: (NOTE) SARS-CoV-2 target nucleic acids are NOT DETECTED.  The SARS-CoV-2 RNA is generally detectable in upper and lower respiratory specimens during the acute phase of infection. The lowest concentration of SARS-CoV-2 viral copies this assay can detect is 250 copies / mL. A negative result does not preclude SARS-CoV-2 infection and should not be used as the sole basis for treatment or other patient management decisions.  A negative result may occur with improper specimen collection / handling, submission of specimen other than nasopharyngeal swab, presence of viral mutation(s) within the areas targeted by this assay, and inadequate number of viral copies (<250 copies / mL). A negative result must be combined with clinical observations, patient history, and epidemiological information.  Fact Sheet for Patients:   StrictlyIdeas.no  Fact Sheet for Healthcare Providers: BankingDealers.co.za  This test is not yet approved or  cleared by the [place] FDA and has been authorized for detection and/or diagnosis of SARS-CoV-2  by FDA under an Emergency Use Authorization (EUA).  This EUA will remain in effect (meaning this test can be used) for the duration of the COVID-19 declaration under Section 564(b)(1) of the Act, 21 U.S.C. section 360bbb-3(b)(1), unless the authorization is terminated or revoked sooner.  Performed at [facility], [address], [place],  [address]         Radiology Studies: CT Angio Chest PE W/Cm &/Or Wo Cm  Result Date: 02/18/2020 CLINICAL DATA:  Chest pain, short of breath, fever, lethargy, history of stage IV metastatic lung cancer EXAM: CT ANGIOGRAPHY CHEST WITH CONTRAST TECHNIQUE: Multidetector CT imaging of the chest was performed using the standard  protocol during bolus administration of intravenous contrast. Multiplanar CT image reconstructions and MIPs were obtained to evaluate the vascular anatomy. CONTRAST:  115m OMNIPAQUE IOHEXOL 350 MG/ML SOLN COMPARISON:  02/07/2020 FINDINGS: Cardiovascular: This is a technically adequate evaluation of the pulmonary vasculature. There is a segmental right upper lobe pulmonary embolus. Minimal clot burden. No other filling defects. Heart is not enlarged. Small pericardial effusion not significantly changed since prior study. Stable aneurysmal dilation of the ascending thoracic aorta measuring 3.7 cm. No dissection. Minimal atherosclerosis. Mediastinum/Nodes: Soft tissue mass in the AP window now measures up to 5.3 cm in diameter, previously having measured 5.1 cm, consistent with progressive adenopathy. Increasing mass effect upon the trachea and left mainstem bronchus, though airways remain patent. Thyroid and esophagus are grossly unremarkable. Lungs/Pleura: The diffuse pulmonary metastases of not changed significantly in size or number since the prior exam performed 11 days ago. Small bilateral pleural effusions are noted, less than 500 cc each. Diffuse interstitial prominence throughout the left lung is concerning for lymphangitic spread of disease given the progressive mediastinal adenopathy. No pneumothorax. Upper Abdomen: Metastatic lesion within the right lobe liver measures up to 2 cm on image 101/4, previously measuring 1.7 cm. Musculoskeletal: The metastatic lesion within the right posterior sixth rib with associated pathologic fracture is again noted unchanged. Mottled lucencies throughout the thoracic spine, most pronounced at T9, consistent with metastatic disease. No new fractures. Review of the MIP images confirms the above findings. IMPRESSION: 1. Single segmental right upper lobe pulmonary embolus. Minimal clot burden. No right heart strain. 2. Diffuse metastatic disease, with innumerable bilateral  pulmonary nodules and masses not appreciably changed since recent exam. 3. Slight enlargement of the AP window adenopathy, with mass effect upon the trachea and left mainstem bronchus as above. Diffuse interstitial prominence throughout the left lung concerning for lymphangitic spread of disease. 4. Increased size of right lobe liver metastasis seen previously. 5. Stable bony metastatic disease as above. 6. Stable pericardial effusion. Critical Value/emergent results were called by telephone at the time of interpretation on 02/18/2020 at 6:43 pm to provider PA JCephus Slater who verbally acknowledged these results. Electronically Signed   By: MRanda NgoM.D.   On: 02/18/2020 18:44   DG Chest Port 1 View  Result Date: 02/18/2020 CLINICAL DATA:  Stage IV lung cancer EXAM: PORTABLE CHEST 1 VIEW COMPARISON:  CT chest dated 02/07/2020 FINDINGS: Patchy/masslike opacity in the left lower lobe with numerous bilateral pulmonary nodules, better evaluated on prior CT. Suspected small left pleural effusion. No pneumothorax. Cardiomegaly. Right chest power port terminates at the cavoatrial junction. IMPRESSION: Patchy/masslike opacity in the left lower lobe, better evaluated on prior CT. Numerous bilateral pulmonary metastases. Suspected small left pleural effusion. Electronically Signed   By: SJulian HyM.D.   On: 02/18/2020 16:15  Scheduled Meds: . Chlorhexidine Gluconate Cloth  6 each Topical Daily  . clindamycin  1 application Topical BID  . feeding supplement  1 Container Oral TID BM  . [START ON 02/20/2020] fentaNYL  1 patch Transdermal Q72H  . folic acid  1 mg Oral Daily  . magnesium oxide  400 mg Oral Daily  . pantoprazole  40 mg Oral Daily  . Rivaroxaban  15 mg Oral BID WC   Followed by  . [START ON 03/11/2020] rivaroxaban  20 mg Oral Q supper  . sodium chloride flush  10-40 mL Intracatheter Q12H   Continuous Infusions: . azithromycin 500 mg (02/18/20 1656)  . cefTRIAXone (ROCEPHIN)   IV Stopped (02/18/20 1648)     LOS: 0 days     Cordelia Poche, MD [facility] 02/19/2020, 8:30 AM  If 7PM-7AM, please contact night-coverage [email or web address]

## 2020-02-20 ENCOUNTER — Inpatient Hospital Stay: Payer: Medicare Other

## 2020-02-20 DIAGNOSIS — I2690 Septic pulmonary embolism without acute cor pulmonale: Secondary | ICD-10-CM

## 2020-02-20 DIAGNOSIS — C3492 Malignant neoplasm of unspecified part of left bronchus or lung: Secondary | ICD-10-CM

## 2020-02-20 LAB — CBC
HCT: 21.6 % — ABNORMAL LOW (ref 36.0–46.0)
Hemoglobin: 6.9 g/dL — CL (ref 12.0–15.0)
MCH: 33.8 pg (ref 26.0–34.0)
MCHC: 31.9 g/dL (ref 30.0–36.0)
MCV: 105.9 fL — ABNORMAL HIGH (ref 80.0–100.0)
Platelets: 238 10*3/uL (ref 150–400)
RBC: 2.04 MIL/uL — ABNORMAL LOW (ref 3.87–5.11)
RDW: 24.2 % — ABNORMAL HIGH (ref 11.5–15.5)
WBC: 11.9 10*3/uL — ABNORMAL HIGH (ref 4.0–10.5)
nRBC: 0.3 % — ABNORMAL HIGH (ref 0.0–0.2)

## 2020-02-20 LAB — PREPARE RBC (CROSSMATCH)

## 2020-02-20 LAB — HEMOGLOBIN AND HEMATOCRIT, BLOOD
HCT: 26.2 % — ABNORMAL LOW (ref 36.0–46.0)
Hemoglobin: 8.5 g/dL — ABNORMAL LOW (ref 12.0–15.0)

## 2020-02-20 MED ORDER — LINACLOTIDE 145 MCG PO CAPS
145.0000 ug | ORAL_CAPSULE | Freq: Every day | ORAL | Status: DC
  Administered 2020-02-20 – 2020-02-22 (×3): 145 ug via ORAL
  Filled 2020-02-20 (×3): qty 1

## 2020-02-20 MED ORDER — SODIUM CHLORIDE 0.9% IV SOLUTION: Freq: Once | INTRAVENOUS | Status: AC

## 2020-02-20 MED ORDER — POLYETHYLENE GLYCOL 3350 17 G PO PACK
17.0000 g | PACK | Freq: Two times a day (BID) | ORAL | Status: DC
  Administered 2020-02-20 – 2020-02-22 (×5): 17 g via ORAL
  Filled 2020-02-20 (×5): qty 1

## 2020-02-20 MED ORDER — AZITHROMYCIN 250 MG PO TABS
500.0000 mg | ORAL_TABLET | Freq: Every day | ORAL | Status: AC
  Administered 2020-02-20 – 2020-02-22 (×3): 500 mg via ORAL
  Filled 2020-02-20 (×3): qty 2

## 2020-02-20 NOTE — TOC Initial Note (Signed)
Transition of Care [facility]) - Initial/Assessment Note    Patient Details  Name: Nancy Moore MRN: [id number] Date of Birth: 1965/07/06  Transition of Care [facility]) CM/SW Contact:    Dessa Phi, RN Phone Number: 02/20/2020, 2:41 PM  Clinical Narrative:  Spoke to patient about d/c plans agree to [facility] services-[facility] able to provide HHPT,aide.   Has a rw. Has own transport home.               Expected Discharge Plan: [place] Barriers to Discharge: Continued Medical Work up   Patient Goals and CMS Choice Patient states their goals for this hospitalization and ongoing recovery are:: go home CMS Medicare.gov Compare Post Acute Care list provided to:: Patient Choice offered to / list presented to : Patient  Expected Discharge Plan and Services Expected Discharge Plan: [place]   Discharge Planning Services: CM Consult   Living arrangements for the past 2 months: Single Family Home                           HH Arranged: PT,Nurse's Aide [facility] Agency: [facility] Date [place]: 02/20/20 Time [place]: 1439 Representative spoke with at [place]: Amy  Prior Living Arrangements/Services Living arrangements for the past 2 months: [place] with:: Self Patient language and need for interpreter reviewed:: Yes Do you feel safe going back to the place where you live?: Yes      Need for Family Participation in Patient Care: No (Comment) Care giver support system in place?: Yes (comment)   Criminal Activity/Legal Involvement Pertinent to Current Situation/Hospitalization: No - Comment as needed  Activities of Daily Living Home Assistive Devices/Equipment: Built-in shower seat,Blood pressure cuff,Grab bars in shower ADL Screening (condition at time of admission) Patient's cognitive ability adequate to safely complete daily activities?: No Is the patient deaf or have difficulty hearing?: No Does the  patient have difficulty seeing, even when wearing glasses/contacts?: No Does the patient have difficulty concentrating, remembering, or making decisions?: Yes Patient able to express need for assistance with ADLs?: Yes Does the patient have difficulty dressing or bathing?: Yes Independently performs ADLs?: No Communication: Independent Dressing (OT): Needs assistance Is this a change from baseline?: Pre-admission baseline Grooming: Needs assistance Is this a change from baseline?: Pre-admission baseline Feeding: Independent Bathing: Needs assistance Is this a change from baseline?: Pre-admission baseline Toileting: Needs assistance Is this a change from baseline?: Pre-admission baseline In/Out Bed: Needs assistance Is this a change from baseline?: Pre-admission baseline Walks in Home: Needs assistance Is this a change from baseline?: Pre-admission baseline Does the patient have difficulty walking or climbing stairs?: Yes Weakness of Legs: None (pt denies) Weakness of Arms/Hands: None  Permission Sought/Granted Permission sought to share information with : Case Manager Permission granted to share information with : Yes, Verbal Permission Granted  Share Information with NAME: Case Manager     Permission granted to share info w Relationship: Shante Leak sister [phone]     Emotional Assessment Appearance:: Appears stated age Attitude/Demeanor/Rapport: Gracious Affect (typically observed): Accepting Orientation: : Oriented to Self,Oriented to Place,Oriented to  Time,Oriented to Situation Alcohol / Substance Use: Not Applicable Psych Involvement: No (comment)  Admission diagnosis:  Acute pulmonary embolism ([place]) [I26.99] Febrile illness [R50.9] Patient Active Problem List   Diagnosis Date Noted  . Acute pulmonary embolism ([place]) 02/18/2020  . Anemia 02/18/2020  . SIRS (systemic inflammatory response  syndrome) ([place]) 02/18/2020  . Visit for wound check 06/01/2019  .  Infection due to Port-A-Cath 05/06/2019  . Port-A-Cath in place 03/24/2019  . Postoperative visit 03/16/2019  . Encounter for postoperative wound check 10/28/2018  . Pleural effusion 10/13/2018  . Brain metastases ([place]) 05/13/2018  . Primary malignant neoplasm of lung with metastasis to brain ([place]) 02/05/2018  . Cardiac/pericardial tamponade   . Chest tube in place   . Pain   . Acute blood loss anemia   . Palliative care encounter   . AKI (acute kidney injury) ([place]) 01/10/2017  . Hypotension 01/09/2017  . Essential hypertension 01/09/2017  . Hypokalemia 01/09/2017  . Dyspnea 01/09/2017  . Leukocytosis 01/09/2017  . Pleural effusion on right 01/08/2017  . Encounter for antineoplastic chemotherapy 11/20/2016  . Adenocarcinoma of left lung, stage 4 ([place]) 10/28/2016  . Goals of care, counseling/discussion 10/28/2016  . Upper airway cough syndrome 10/16/2016  . Multiple pulmonary nodules 10/15/2016   PCP:  Copland, Gay Filler, MD Pharmacy:   [facility], [address] [place] [address] Phone: [phone] Fax: [phone]  [facility], [address], [address] [place], [place] [address] Phone: [phone] Fax: [phone]  [facility] [place], [place] [address] Phone: [phone] Fax: [phone]     Social Determinants of Health (SDOH) Interventions    Readmission Risk Interventions Readmission Risk Prevention Plan 02/20/2020  Transportation Screening Complete  PCP or Specialist Appt within 3-5 Days Complete  HRI or Home Care Consult Complete  Social Work Consult for [place] Planning/Counseling Complete  Palliative Care Screening Complete  Medication Review Press photographer) Complete  Some recent data might be hidden

## 2020-02-20 NOTE — Progress Notes (Signed)
OT Cancellation Note  Patient Details Name: Nancy Moore MRN: [id number] DOB: 06-25-1965   Cancelled Treatment:    Reason Eval/Treat Not Completed: Other (comment). Lunch arrived just as I arrived to evaluate patient and she prefers to eat lunch first since she did not eat much breakfast. Will re-attempt eval at later date.  [place], OTR/L Acute Rehab Services Pager [phone] Office [phone]     Almon Register 02/20/2020, 2:21 PM

## 2020-02-20 NOTE — Progress Notes (Signed)
CRITICAL VALUE ALERT  Critical Value: HGB 6.9 Date & Time Notied:  02/20/20 0447  Provider Notified: Kennon Holter NP  Orders Received/Actions taken: 0450

## 2020-02-20 NOTE — Progress Notes (Signed)
PROGRESS NOTE    Nancy Moore  TDD:[id number] DOB: 04-Dec-1965 DOA: 02/18/2020 PCP: Darreld Mclean, MD   Brief Narrative: Nancy Moore is a 55 y.o. female with a history of stage 4 NSCLC with metastasis to brain, lung and liver. Patient presented secondary to fever but while in the ED, was found to have an acute PE. Patient started on anticoagulation.   Assessment & Plan:   Principal Problem:   Acute pulmonary embolism (HCC) Active Problems:   Adenocarcinoma of left lung, stage 4 (HCC)   Primary malignant neoplasm of lung with metastasis to brain (HCC)   Anemia   SIRS (systemic inflammatory response syndrome) (HCC)  Acute pulmonary embolism Acute Left LE DVT In setting of underlying metastatic lung cancer. Started on Xarelto per hematology recommendations. No heart strain on CT scan. Transthoracic Echocardiogram significant for normal RV function. -Continue Xarelto  SIRS This appears to be at least in part secondary to acute PE. Patient with fever but no infectious source. Blood and urine cultures obtained. -Follow up cultures  Non-small cell lung cancer Metastasis to brain, liver, lung Previously on chemotherapy, now on directed therapy on Tagrisso. Follows with Dr. Julien Nordmann as an outpatient. Oncology with no recommendations with regard to cancer today; recommendations to await for patient's primary oncologist -Oncology recommendations: pending today  Hypokalemia Potassium of 2.7 on admission. Resolved with repletion.  Acute on chronic anemia anemia Unsure of why she has acute anemia. She does have many metastatic lesions. No overt evidence of bleeding. Discussed blood transfusion but patient would like to wait prior to agreeing. Hemoglobin down to 6.9 today -Transfuse 1 unit of PRBC -Serial CBC  Chronic pain -Continue Fentanyl patch, Oxycodone prn  Constipation -Continue Linzess -Miralax BID   DVT prophylaxis: Xarelto Code Status:   Code Status: DNR Family  Communication: None at bedside Disposition Plan: Discharge home likely in 1-2 days pending stable hemoglobin, oncology recommendations   Consultants:   Hematology/Oncology  Procedures:   TRANSTHORACIC ECHOCARDIOGRAM (02/19/2020) IMPRESSIONS    1. There is a false tendon in the mid LV cavity of no clinical  significance. . Left ventricular ejection fraction, by estimation, is 60  to 65%. The left ventricle has normal function. The left ventricle has no  regional wall motion abnormalities. Left  ventricular diastolic parameters were normal.  2. Right ventricular systolic function is normal. The right ventricular  size is normal. Tricuspid regurgitation signal is inadequate for assessing  PA pressure.  3. A small pericardial effusion is present. The pericardial effusion is  localized mainly near the right atrium but also circumferential.  4. The mitral valve is normal in structure. Mild mitral valve  regurgitation. No evidence of mitral stenosis.  5. The aortic valve is normal in structure. Aortic valve regurgitation is  trivial. No aortic stenosis is present.  6. The inferior vena cava is normal in size with greater than 50%  respiratory variability, suggesting right atrial pressure of 3 mmHg.  Antimicrobials:  None    Subjective: No issues this morning. Feels like she was confused yesterday but feels better today.  Objective: Vitals:   02/19/20 2056 02/20/20 0039 02/20/20 0248 02/20/20 0440  BP: 100/66 97/78 111/72 102/65  Pulse: (!) 104 (!) 102 100 95  Resp:   20 14  Temp: 99.2 F (37.3 C) 98.9 F (37.2 C) 99 F (37.2 C) 98.9 F (37.2 C)  TempSrc: Oral Oral  Oral  SpO2: 98% 95% 94% 95%  Weight:  Height:        Intake/Output Summary (Last 24 hours) at 02/20/2020 0856 Last data filed at 02/20/2020 0440 Gross per 24 hour  Intake 160 ml  Output 700 ml  Net -540 ml   Filed Weights   02/19/20 0404  Weight: 75.9 kg    Examination:  General exam:  Appears calm and comfortable Respiratory system: Clear to auscultation. Respiratory effort normal. Cardiovascular system: S1 & S2 heard, RRR. 1/6 systolic murmur Gastrointestinal system: Abdomen is mildly distended, soft and nontender. No organomegaly or masses felt. Normal bowel sounds heard. Central nervous system: Alert and oriented. No focal neurological deficits. Musculoskeletal: No edema. No calf tenderness Skin: No cyanosis. No rashes Psychiatry: Judgement and insight appear normal. Mood & affect appropriate.     Data Reviewed: I have personally reviewed following labs and imaging studies  CBC Lab Results  Component Value Date   WBC 11.9 (H) 02/20/2020   RBC 2.04 (L) 02/20/2020   HGB 6.9 (LL) 02/20/2020   HCT 21.6 (L) 02/20/2020   MCV 105.9 (H) 02/20/2020   MCH 33.8 02/20/2020   PLT 238 02/20/2020   MCHC 31.9 02/20/2020   RDW 24.2 (H) 02/20/2020   LYMPHSABS 0.9 02/18/2020   MONOABS 2.0 (H) 02/18/2020   EOSABS 0.0 02/18/2020   BASOSABS 0.1 86/76/7209     Last metabolic panel Lab Results  Component Value Date   NA 134 (L) 02/19/2020   K 3.5 02/19/2020   CL 99 02/19/2020   CO2 27 02/19/2020   BUN 6 02/19/2020   CREATININE 0.84 02/19/2020   GLUCOSE 111 (H) 02/19/2020   GFRNONAA >60 02/19/2020   GFRAA >60 10/26/2019   CALCIUM 8.9 02/19/2020   PHOS 4.1 10/14/2018   PROT 7.0 02/18/2020   ALBUMIN 3.0 (L) 02/18/2020   BILITOT 0.6 02/18/2020   ALKPHOS 67 02/18/2020   AST 42 (H) 02/18/2020   ALT 20 02/18/2020   ANIONGAP 8 02/19/2020    CBG (last 3)  No results for input(s): GLUCAP in the last 72 hours.   GFR: Estimated Creatinine Clearance: 80.6 mL/min (by C-G formula based on SCr of 0.84 mg/dL).  Coagulation Profile: Recent Labs  Lab 02/18/20 1527  INR 1.4*    Recent Results (from the past 240 hour(s))  Culture, blood (Routine x 2)     Status: None (Preliminary result)   Collection Time: 02/18/20  3:27 PM   Specimen: BLOOD  Result Value Ref Range  Status   Specimen Description   Final    BLOOD PORTA CATH Performed at [facility] [address], [place], [place] [address]    Special Requests   Final    BOTTLES DRAWN AEROBIC AND ANAEROBIC Blood Culture adequate volume Performed at [facility] [address] [place], [place] [address]    Culture   Final    NO GROWTH < 24 HOURS Performed at [facility] [address], [place], [place] [address]    Report Status PENDING  Incomplete  Culture, blood (Routine x 2)     Status: None (Preliminary result)   Collection Time: 02/18/20  3:32 PM   Specimen: BLOOD  Result Value Ref Range Status   Specimen Description   Final    BLOOD LEFT ANTECUBITAL Performed at [facility] [address], [place], [place] [address]    Special Requests   Final    BOTTLES DRAWN AEROBIC AND ANAEROBIC Blood Culture results may not be optimal due to an excessive  volume of blood received in culture bottles Performed at [facility] [address] [place], [place] [address]    Culture   Final    NO GROWTH < 24 HOURS Performed at [facility] [address] [place], [place] [address]    Report Status PENDING  Incomplete  SARS Coronavirus 2 by RT PCR (hospital order, performed in [facility] hospital lab) Nasopharyngeal Nasopharyngeal Swab     Status: None   Collection Time: 02/18/20  3:41 PM   Specimen: Nasopharyngeal Swab  Result Value Ref Range Status   SARS Coronavirus 2 NEGATIVE NEGATIVE Final    Comment: (NOTE) SARS-CoV-2 target nucleic acids are NOT DETECTED.  The SARS-CoV-2 RNA is generally detectable in upper and lower respiratory specimens during the acute phase of infection. The lowest concentration of SARS-CoV-2 viral copies this assay can detect is 250 copies / mL. A negative result does not preclude SARS-CoV-2 infection and should not be used as the sole basis for treatment or other patient  management decisions.  A negative result may occur with improper specimen collection / handling, submission of specimen other than nasopharyngeal swab, presence of viral mutation(s) within the areas targeted by this assay, and inadequate number of viral copies (<250 copies / mL). A negative result must be combined with clinical observations, patient history, and epidemiological information.  Fact Sheet for Patients:   StrictlyIdeas.no  Fact Sheet for Healthcare Providers: BankingDealers.co.za  This test is not yet approved or  cleared by the [place] FDA and has been authorized for detection and/or diagnosis of SARS-CoV-2 by FDA under an Emergency Use Authorization (EUA).  This EUA will remain in effect (meaning this test can be used) for the duration of the COVID-19 declaration under Section 564(b)(1) of the Act, 21 U.S.C. section 360bbb-3(b)(1), unless the authorization is terminated or revoked sooner.  Performed at [facility], [place] [address] [place], [place] [address]         Radiology Studies: CT Angio Chest PE W/Cm &/Or Wo Cm  Result Date: 02/18/2020 CLINICAL DATA:  Chest pain, short of breath, fever, lethargy, history of stage IV metastatic lung cancer EXAM: CT ANGIOGRAPHY CHEST WITH CONTRAST TECHNIQUE: Multidetector CT imaging of the chest was performed using the standard protocol during bolus administration of intravenous contrast. Multiplanar CT image reconstructions and MIPs were obtained to evaluate the vascular anatomy. CONTRAST:  149m OMNIPAQUE IOHEXOL 350 MG/ML SOLN COMPARISON:  02/07/2020 FINDINGS: Cardiovascular: This is a technically adequate evaluation of the pulmonary vasculature. There is a segmental right upper lobe pulmonary embolus. Minimal clot burden. No other filling defects. Heart is not enlarged. Small pericardial effusion not significantly changed since prior study. Stable aneurysmal  dilation of the ascending thoracic aorta measuring 3.7 cm. No dissection. Minimal atherosclerosis. Mediastinum/Nodes: Soft tissue mass in the AP window now measures up to 5.3 cm in diameter, previously having measured 5.1 cm, consistent with progressive adenopathy. Increasing mass effect upon the trachea and left mainstem bronchus, though airways remain patent. Thyroid and esophagus are grossly unremarkable. Lungs/Pleura: The diffuse pulmonary metastases of not changed significantly in size or number since the prior exam performed 11 days ago. Small bilateral pleural effusions are noted, less than 500 cc each. Diffuse interstitial prominence throughout the left lung is concerning for lymphangitic spread of disease given the progressive mediastinal adenopathy. No pneumothorax. Upper Abdomen: Metastatic lesion within the right lobe liver measures up to 2 cm on image 101/4, previously measuring 1.7 cm. Musculoskeletal: The metastatic  lesion within the right posterior sixth rib with associated pathologic fracture is again noted unchanged. Mottled lucencies throughout the thoracic spine, most pronounced at T9, consistent with metastatic disease. No new fractures. Review of the MIP images confirms the above findings. IMPRESSION: 1. Single segmental right upper lobe pulmonary embolus. Minimal clot burden. No right heart strain. 2. Diffuse metastatic disease, with innumerable bilateral pulmonary nodules and masses not appreciably changed since recent exam. 3. Slight enlargement of the AP window adenopathy, with mass effect upon the trachea and left mainstem bronchus as above. Diffuse interstitial prominence throughout the left lung concerning for lymphangitic spread of disease. 4. Increased size of right lobe liver metastasis seen previously. 5. Stable bony metastatic disease as above. 6. Stable pericardial effusion. Critical Value/emergent results were called by telephone at the time of interpretation on 02/18/2020 at 6:43  pm to provider PA Cephus Slater, who verbally acknowledged these results. Electronically Signed   By: Randa Ngo M.D.   On: 02/18/2020 18:44   DG Chest Port 1 View  Result Date: 02/18/2020 CLINICAL DATA:  Stage IV lung cancer EXAM: PORTABLE CHEST 1 VIEW COMPARISON:  CT chest dated 02/07/2020 FINDINGS: Patchy/masslike opacity in the left lower lobe with numerous bilateral pulmonary nodules, better evaluated on prior CT. Suspected small left pleural effusion. No pneumothorax. Cardiomegaly. Right chest power port terminates at the cavoatrial junction. IMPRESSION: Patchy/masslike opacity in the left lower lobe, better evaluated on prior CT. Numerous bilateral pulmonary metastases. Suspected small left pleural effusion. Electronically Signed   By: Julian Hy M.D.   On: 02/18/2020 16:15   ECHOCARDIOGRAM COMPLETE  Result Date: 02/19/2020    ECHOCARDIOGRAM REPORT   Patient Name:   NAUREEN BENTON Locascio Date of Exam: 02/19/2020 Medical Rec #:  [id number]     Height:       66.0 in Accession #:    [id number]    Weight:       167.3 lb Date of Birth:  1965/03/03      BSA:          1.854 m Patient Age:    37 years      BP:           109/69 mmHg Patient Gender: F             HR:           102 bpm. Exam Location:  Inpatient Procedure: 2D Echo Indications:    Pulmonary Embolus I26.99  History:        Patient has prior history of Echocardiogram examinations, most                 recent 01/10/2017. Lung cancer, Signs/Symptoms:Dyspnea; Risk                 Factors:Hypertension.  Sonographer:    Mikki Santee RDCS (AE) Referring Phys: [place]  1. There is a false tendon in the mid LV cavity of no clinical significance. . Left ventricular ejection fraction, by estimation, is 60 to 65%. The left ventricle has normal function. The left ventricle has no regional wall motion abnormalities. Left ventricular diastolic parameters were normal.  2. Right ventricular systolic function is normal. The right  ventricular size is normal. Tricuspid regurgitation signal is inadequate for assessing PA pressure.  3. A small pericardial effusion is present. The pericardial effusion is localized mainly near the right atrium but also circumferential.  4. The mitral valve is normal in structure. Mild mitral valve regurgitation. No  evidence of mitral stenosis.  5. The aortic valve is normal in structure. Aortic valve regurgitation is trivial. No aortic stenosis is present.  6. The inferior vena cava is normal in size with greater than 50% respiratory variability, suggesting right atrial pressure of 3 mmHg. FINDINGS  Left Ventricle: There is a false tendon in the mid LV cavity of no clinical significance. Left ventricular ejection fraction, by estimation, is 60 to 65%. The left ventricle has normal function. The left ventricle has no regional wall motion abnormalities. The left ventricular internal cavity size was normal in size. There is no left ventricular hypertrophy. Left ventricular diastolic parameters were normal. Normal left ventricular filling pressure. Right Ventricle: The right ventricular size is normal. No increase in right ventricular wall thickness. Right ventricular systolic function is normal. Tricuspid regurgitation signal is inadequate for assessing PA pressure. Left Atrium: Left atrial size was normal in size. Right Atrium: Right atrial size was normal in size. Pericardium: A small pericardial effusion is present. The pericardial effusion is localized near the right atrium and circumferential. Mitral Valve: The mitral valve is normal in structure. Mild mitral valve regurgitation. No evidence of mitral valve stenosis. Tricuspid Valve: The tricuspid valve is normal in structure. Tricuspid valve regurgitation is trivial. No evidence of tricuspid stenosis. Aortic Valve: The aortic valve is normal in structure. Aortic valve regurgitation is trivial. No aortic stenosis is present. Pulmonic Valve: The pulmonic valve  was normal in structure. Pulmonic valve regurgitation is trivial. No evidence of pulmonic stenosis. Aorta: The aortic root is normal in size and structure. Venous: The inferior vena cava is normal in size with greater than 50% respiratory variability, suggesting right atrial pressure of 3 mmHg. IAS/Shunts: No atrial level shunt detected by color flow Doppler.  LEFT VENTRICLE PLAX 2D LVIDd:         5.00 cm  Diastology LVIDs:         3.30 cm  LV e' medial:    8.38 cm/s LV PW:         0.80 cm  LV E/e' medial:  9.8 LV IVS:        0.70 cm  LV e' lateral:   8.05 cm/s LVOT diam:     2.00 cm  LV E/e' lateral: 10.2 LV SV:         43 LV SV Index:   23 LVOT Area:     3.14 cm  RIGHT VENTRICLE RV S prime:     11.20 cm/s TAPSE (M-mode): 1.4 cm LEFT ATRIUM           Index       RIGHT ATRIUM           Index LA diam:      3.40 cm 1.83 cm/m  RA Area:     12.40 cm LA Vol (A2C): 20.4 ml 11.00 ml/m RA Volume:   25.80 ml  13.92 ml/m LA Vol (A4C): 42.9 ml 23.14 ml/m  AORTIC VALVE LVOT Vmax:   96.00 cm/s LVOT Vmean:  68.000 cm/s LVOT VTI:    0.137 m  AORTA Ao Root diam: 3.10 cm MITRAL VALVE MV Area (PHT): 4.36 cm    SHUNTS MV Decel Time: 174 msec    Systemic VTI:  0.14 m MV E velocity: 82.30 cm/s  Systemic Diam: 2.00 cm MV A velocity: 56.10 cm/s MV E/A ratio:  1.47 Fransico Him MD Electronically signed by Fransico Him MD Signature Date/Time: 02/19/2020/11:52:09 AM    Final    VAS [place] LOWER  EXTREMITY VENOUS (DVT)  Result Date: 02/19/2020  Lower Venous DVT Study Indications: Pulmonary embolism, and SOB.  Risk Factors: Cancer Brains Mets. Anticoagulation: Xarelto. Comparison Study: No previous lower ext. Performing Technologist: Vonzell Schlatter RVT  Examination Guidelines: A complete evaluation includes B-mode imaging, spectral Doppler, color Doppler, and power Doppler as needed of all accessible portions of each vessel. Bilateral testing is considered an integral part of a complete examination. Limited examinations for reoccurring  indications may be performed as noted. The reflux portion of the exam is performed with the patient in reverse Trendelenburg.  +---------+---------------+---------+-----------+----------+--------------+ RIGHT    CompressibilityPhasicitySpontaneityPropertiesThrombus Aging +---------+---------------+---------+-----------+----------+--------------+ CFV      Full           Yes      Yes                                 +---------+---------------+---------+-----------+----------+--------------+ SFJ      Full                                                        +---------+---------------+---------+-----------+----------+--------------+ FV Prox  Full                                                        +---------+---------------+---------+-----------+----------+--------------+ FV Mid   Full                                                        +---------+---------------+---------+-----------+----------+--------------+ FV DistalFull                                                        +---------+---------------+---------+-----------+----------+--------------+ PFV      Full                                                        +---------+---------------+---------+-----------+----------+--------------+ POP      Full           Yes      Yes                                 +---------+---------------+---------+-----------+----------+--------------+ PTV      Full                                                        +---------+---------------+---------+-----------+----------+--------------+ PERO     Full                                                        +---------+---------------+---------+-----------+----------+--------------+   +---------+---------------+---------+-----------+----------+--------------+  LEFT     CompressibilityPhasicitySpontaneityPropertiesThrombus Aging  +---------+---------------+---------+-----------+----------+--------------+ CFV      Full           Yes      Yes                                 +---------+---------------+---------+-----------+----------+--------------+ SFJ      Full                                                        +---------+---------------+---------+-----------+----------+--------------+ FV Prox  Full                                                        +---------+---------------+---------+-----------+----------+--------------+ FV Mid   Full                                                        +---------+---------------+---------+-----------+----------+--------------+ FV DistalFull                                                        +---------+---------------+---------+-----------+----------+--------------+ PFV      Full                                                        +---------+---------------+---------+-----------+----------+--------------+ POP      Full           Yes      Yes                                 +---------+---------------+---------+-----------+----------+--------------+ PTV      None                                         Acute          +---------+---------------+---------+-----------+----------+--------------+ PERO     None                                         Acute          +---------+---------------+---------+-----------+----------+--------------+  Summary: RIGHT: - There is no evidence of deep vein thrombosis in the lower extremity.  - No cystic structure found in the popliteal fossa.  LEFT: - Findings consistent with acute deep vein thrombosis involving the left posterior tibial veins, and left peroneal veins. - No cystic structure found in the popliteal fossa.  *See  table(s) above for measurements and observations. Electronically signed by Deitra Mayo MD on 02/19/2020 at 4:28:15 PM.    Final         Scheduled Meds: . sodium  chloride   Intravenous Once  . azithromycin  500 mg Oral Daily  . Chlorhexidine Gluconate Cloth  6 each Topical Daily  . clindamycin  1 application Topical BID  . feeding supplement  1 Container Oral TID BM  . fentaNYL  1 patch Transdermal Q72H  . folic acid  1 mg Oral Daily  . magnesium oxide  400 mg Oral Daily  . pantoprazole  40 mg Oral Daily  . Rivaroxaban  15 mg Oral BID WC   Followed by  . [START ON 03/11/2020] rivaroxaban  20 mg Oral Q supper  . sodium chloride flush  10-40 mL Intracatheter Q12H   Continuous Infusions: . cefTRIAXone (ROCEPHIN)  IV 2 g (02/20/20 0843)     LOS: 1 day     Cordelia Poche, MD [facility] 02/20/2020, 8:56 AM  If 7PM-7AM, please contact night-coverage [email or web address]

## 2020-02-20 NOTE — Progress Notes (Signed)
Initial Nutrition Assessment  DOCUMENTATION CODES:   Not applicable  INTERVENTION:   - continue with Boost Breeze TID (250 kcal, 9 g protein each) - avoid strong odors and have cool temperature foods when feeling nauseated - MVI with minerals   NUTRITION DIAGNOSIS:   Inadequate oral intake related to cancer and cancer related treatments,poor appetite as evidenced by estimated needs,percent weight loss.  GOAL:   Patient will meet greater than or equal to 90% of their needs  MONITOR:   PO intake,Supplement acceptance,Weight trends,Labs  REASON FOR ASSESSMENT:   Malnutrition Screening Tool    ASSESSMENT:   51 YOF admitted for fever secondary to stage 4 NSCLC with mets to lung, brain and liver and recent progression of dz including lymphangitic spread in lung. PMH of HTN, arthritis, constipation, GERD. Pt is currently receiving oral chemo (Tragrisso).   Pt indicated that her appetite has been suppressed due to nausea and vomiting from cancer treatment. Through pt's hospital stay, notes indicated she has eaten 25% of meal on 02/20/20.  Intern explained potential interventions to combat nausea symptoms such as avoiding strong smelling foods as well as having cool foods.   Pt indicated that she is currently taking Boost Breeze supplement TID. Intern questioned if there was a reason as to why she was taking [employer] rather than a creamier supplement such as Ensure Enlive that has higher protein and kcals and pt explained creamy supplements have caused GI distress in the past. Intern encouraged continuation of Boost Breeze supplement and discussed benefits of the extra kcals and protein it provides.   Pt explained she has not had a bowel movement while in the hospital and has been experiencing constipation due to her pain medications. Pt is currently on Miralax (17 g, BID).   Pt explained she felt she had lost weight due to her cancer. Weight trends indicate a 9% weight loss in the  past 3 months, which is significant.   Pt explained that her mobility was fine PTA, but it has decreased while being in the hospital. Pt has experienced more fatigue, but did explain she has a walker at home.   Pt explained her sister and her niece will be staying with her when she returns home to help with care.   Meds Reviewed: Boost Breeze TID. Folic Acid (1 mg daily), Magnesium (400 mg daily), Miralax (17 g, BID).  Labs Reviewed.   NUTRITION - FOCUSED PHYSICAL EXAM:  Flowsheet Row Most Recent Value  Orbital Region No depletion  Upper Arm Region No depletion  Thoracic and Lumbar Region No depletion  Buccal Region No depletion  Temple Region Mild depletion  Clavicle Bone Region No depletion  Clavicle and Acromion Bone Region No depletion  Scapular Bone Region No depletion  Dorsal Hand No depletion  Patellar Region No depletion  Anterior Thigh Region No depletion  Posterior Calf Region No depletion  Edema (RD Assessment) Mild  Hair Reviewed  Eyes Reviewed  Mouth Unable to assess  Skin Reviewed  Nails Reviewed       Diet Order:   Diet Order            Diet regular Room service appropriate? Yes; Fluid consistency: Thin  Diet effective now                 EDUCATION NEEDS:   No education needs have been identified at this time  Skin:     Last BM:  Unknown/PTA  Height:   Ht Readings from Last  1 Encounters:  02/19/20 5\' 6"  (9.144 m)    Weight:   Wt Readings from Last 1 Encounters:  02/19/20 75.9 kg    Ideal Body Weight:  59 kg  BMI:  Body mass index is 27.01 kg/m.  Estimated Nutritional Needs:   Kcal:  2100-2300  Protein:  90-105  Fluid:  >2.1 L    Salvadore Oxford, Dietetic Intern 02/20/2020 4:39 PM

## 2020-02-20 NOTE — Progress Notes (Signed)
   02/19/20 2056  Assess: MEWS Score  Temp 99.2 F (37.3 C)  BP 100/66  Pulse Rate (!) 104  Level of Consciousness Alert  SpO2 98 %  O2 Device Nasal Cannula  O2 Flow Rate (L/min) 2 L/min  Assess: MEWS Score  MEWS Temp 0  MEWS Systolic 1  MEWS Pulse 1  MEWS RR 0  MEWS LOC 0  MEWS Score 2  MEWS Score Color Yellow  Assess: if the MEWS score is Yellow or Red  Were vital signs taken at a resting state? Yes  Focused Assessment Change from prior assessment (see assessment flowsheet)  Early Detection of Sepsis Score *See Row Information* Low  MEWS guidelines implemented *See Row Information* Yes  Treat  MEWS Interventions Administered scheduled meds/treatments  Pain Scale 0-10  Pain Score 4  Pain Type Acute pain  Pain Location Back  Pain Orientation Lower  Pain Descriptors / Indicators Aching  Pain Frequency Intermittent  Pain Onset Gradual  Patients Stated Pain Goal 2  Pain Intervention(s) Emotional support  Take Vital Signs  Increase Vital Sign Frequency  Yellow: Q 2hr X 2 then Q 4hr X 2, if remains yellow, continue Q 4hrs  Escalate  MEWS: Escalate Yellow: discuss with charge nurse/RN and consider discussing with provider and RRT  Notify: Charge Nurse/RN  Name of Charge Nurse/RN Notified Chole  Date Charge Nurse/RN Notified 02/19/20  Time Charge Nurse/RN Notified 2100  Document  Patient Outcome Stabilized after interventions  Patient is asymptotic and not in distress.  Offered pain med but refused, will recheck vital in 2 hours

## 2020-02-20 NOTE — Progress Notes (Signed)
Telephone Note I received a telephone call from Naimah's sister, Doylene Bode with Hudsyn's permission to talk to me about her current condition and prognosis. I reviewed the most recent imaging studies again with [facility] as well as the treatment options that were discussed in the last MyChart video visit with Ms. Rinn and her sister. Unfortunately with the significant disease progression, her prognosis is very poor and I strongly recommend for her to consider palliative care on hospice at this point but if the patient is interested in further treatment, we could consider her for second line systemic chemotherapy with docetaxel and Cyramza. She will continue her current treatment with Tagrisso for now until she finished the doses available with her at home. I will wait for Ms. Brundage and her sister to make a decision regarding her options and I will be happy to assist her in any way. The sister was appreciative for the telephone call and for the care Ms. Villarruel received so far.

## 2020-02-20 NOTE — Progress Notes (Signed)
PHARMACIST - PHYSICIAN COMMUNICATION   CONCERNING: Antibiotic IV to Oral Route Change Policy   RECOMMENDATION: This patient is receiving azithromycin by the intravenous route.  Based on criteria approved by the Pharmacy and Therapeutics Committee, the antibiotic(s) is/are being converted to the equivalent oral dose form(s).     DESCRIPTION:  These criteria include:  Patient being treated for a respiratory tract infection, urinary tract infection, cellulitis or clostridium difficile associated diarrhea if on metronidazole  The patient is not neutropenic and does not exhibit a GI malabsorption state  The patient is eating (either orally or via tube) and/or has been taking other orally administered medications for a least 24 hours  The patient is improving clinically and has a Tmax < 100.5   If you have questions about this conversion, please contact the Pharmacy Department  []   [phone] )  [facility] []   [phone] )  [facility] []   [phone] )  [facility] []   [phone] )  [facility] [x]   [phone] )  [place], PharmD, [place], [place] Infectious Diseases Clinical Pharmacist Phone: [phone] 02/20/2020 8:56 AM

## 2020-02-20 NOTE — Progress Notes (Signed)
HEMATOLOGY-ONCOLOGY PROGRESS NOTE  SUBJECTIVE: Nancy Moore is followed by our practice for stage IV non-small cell lung cancer, poorly differentiated adenocarcinoma.  Her most recent visit with [place] was on 02/13/2020.  At that visit, scans were reviewed which show disease progression.  The patient was given the option of palliative care and hospice versus consideration of second line systemic chemotherapy with docetaxel 75 mg per metered squared and Cyramza 10 mg/kg every 3 weeks versus resuming her treatment with EGFR tyrosine kinase inhibitor again with either Tagrisso or Tarceva.  She was also referred back to Dr. Durenda Hurt at [facility] to see if he has any additional recommendations regarding her condition.  In the short-term, she was advised to resume treatment with Tagrisso until she decide how she wished to proceed.  The patient came to the hospital secondary to fever.  She was also having shortness of breath.  CTA chest showed right upper lobe pulmonary embolus with no right heart strain.  Her bilateral pulmonary nodules were essentially unchanged, there was slight enlargement of the AP window adenopathy, increase in size of the right lobe with liver metastasis, and stable bony metastatic disease.  She was started on Xarelto for the PE.  Cultures were obtained and she was started on IV antibiotics.  Tagrisso is on hold due to SIRS criteria.   I met with the patient in her hospital room today.  No family was at the bedside.  She is not having any recurrent fevers at this time.  Reports a slight cough and mild shortness of breath.  Otherwise, she offers no other complaints today.  Oncology History  Primary malignant neoplasm of lung with metastasis to brain ([place])  02/05/2018 Initial Diagnosis   Primary malignant neoplasm of lung with metastasis to brain ([place])   02/11/2019 -  Chemotherapy   The patient had palonosetron (ALOXI) injection 0.25 mg, 0.25 mg, Intravenous,  Once, 7 of 7 cycles Administration: 0.25  mg (02/11/2019), 0.25 mg (03/24/2019), 0.25 mg (04/15/2019), 0.25 mg (06/02/2019), 0.25 mg (03/03/2019), 0.25 mg (05/05/2019), 0.25 mg (05/12/2019) PEMEtrexed (ALIMTA) 1,100 mg in sodium chloride 0.9 % 100 mL chemo infusion, 500 mg/m2 = 1,100 mg, Intravenous,  Once, 13 of 18 cycles Dose modification: 400 mg/m2 (original dose 500 mg/m2, Cycle 6, Reason: Provider Judgment), 400 mg/m2 (original dose 500 mg/m2, Cycle 5, Reason: Provider Judgment) Administration: 1,100 mg (02/11/2019), 1,100 mg (03/24/2019), 1,100 mg (04/15/2019), 800 mg (06/02/2019), 1,100 mg (03/03/2019), 800 mg (06/23/2019), 800 mg (07/14/2019), 800 mg (08/04/2019), 800 mg (05/12/2019), 800 mg (08/24/2019), 800 mg (09/14/2019), 800 mg (10/05/2019), 800 mg (10/26/2019) CARBOplatin (PARAPLATIN) 580 mg in sodium chloride 0.9 % 250 mL chemo infusion, 580 mg (100 % of original dose 580 mg), Intravenous,  Once, 6 of 6 cycles Dose modification: 580 mg (original dose 580 mg, Cycle 1), 580 mg (original dose 580 mg, Cycle 3), 605.5 mg (original dose 605.5 mg, Cycle 4), 750 mg (original dose 750 mg, Cycle 6), 600 mg (original dose 750 mg, Cycle 6, Reason: Provider Judgment), 679 mg (original dose 679 mg, Cycle 2), 477.6 mg (original dose 477.6 mg, Cycle 5), 563.2 mg (original dose 477.6 mg, Cycle 5, Reason: Provider Judgment) Administration: 580 mg (02/11/2019), 580 mg (03/24/2019), 610 mg (04/15/2019), 600 mg (06/02/2019), 560 mg (05/12/2019) fosaprepitant (EMEND) 150 mg in sodium chloride 0.9 % 145 mL IVPB, 150 mg, Intravenous,  Once, 7 of 7 cycles Administration: 150 mg (02/11/2019), 150 mg (03/24/2019), 150 mg (04/15/2019), 150 mg (06/02/2019), 150 mg (03/03/2019), 150 mg (05/05/2019), 150 mg (  05/12/2019) bevacizumab-awwb (MVASI) 1,500 mg in sodium chloride 0.9 % 100 mL chemo infusion, 15.2 mg/kg = 1,475 mg (100 % of original dose 15 mg/kg), Intravenous,  Once, 12 of 17 cycles Dose modification: 15 mg/kg (original dose 15 mg/kg, Cycle 2) Administration: 1,500 mg (03/03/2019), 1,500 mg  (03/24/2019), 1,500 mg (04/15/2019), 1,300 mg (06/02/2019), 1,300 mg (06/23/2019), 1,300 mg (07/14/2019), 1,300 mg (08/04/2019), 1,300 mg (08/24/2019), 1,300 mg (09/14/2019), 1,300 mg (10/05/2019), 1,300 mg (10/26/2019) bevacizumab-bvzr (ZIRABEV) 1,500 mg in sodium chloride 0.9 % 100 mL chemo infusion, 15 mg/kg = 1,500 mg, Intravenous,  Once, 1 of 1 cycle Administration: 1,500 mg (02/11/2019)  for chemotherapy treatment.       REVIEW OF SYSTEMS:   Constitutional: No fevers or chills since admission Eyes: Denies blurriness of vision Ears, nose, mouth, throat, and face: Denies mucositis or sore throat Respiratory: Reports mild cough and dyspnea Cardiovascular: Denies palpitation, chest discomfort Gastrointestinal:  Denies nausea, heartburn or change in bowel habits Skin: Denies abnormal skin rashes Lymphatics: Denies new lymphadenopathy or easy bruising Neurological:Denies numbness, tingling or new weaknesses Behavioral/Psych: Mood is stable, no new changes  Extremities: No lower extremity edema All other systems were reviewed with the patient and are negative.  I have reviewed the past medical history, past surgical history, social history and family history with the patient and they are unchanged from previous note.   PHYSICAL EXAMINATION: ECOG PERFORMANCE STATUS: 2 - Symptomatic, <50% confined to bed  Vitals:   02/20/20 1023 02/20/20 1047  BP: 112/70 111/67  Pulse: 95 90  Resp:  17  Temp: 98.1 F (36.7 C) 98.8 F (37.1 C)  SpO2: 100%    Filed Weights   02/19/20 0404  Weight: 75.9 kg    Intake/Output from previous day: 01/23 0701 - 01/24 0700 In: 160 [P.O.:160] Out: 700 [Urine:700]  GENERAL:alert, no distress and comfortable SKIN: skin color, texture, turgor are normal, no rashes or significant lesions EYES: normal, Conjunctiva are pink and non-injected, sclera clear OROPHARYNX:no exudate, no erythema and lips, buccal mucosa, and tongue normal  LUNGS: clear to auscultation and  percussion with normal breathing effort HEART: regular rate & rhythm, no lower extremity edema ABDOMEN:abdomen soft, non-tender and normal bowel sounds NEURO: alert & oriented x 3 with fluent speech, no focal motor/sensory deficits  LABORATORY DATA:  I have reviewed the data as listed CMP Latest Ref Rng & Units 02/19/2020 02/18/2020 02/07/2020  Glucose 70 - 99 mg/dL 111(H) 168(H) 97  BUN 6 - 20 mg/dL 6 9 8   Creatinine 0.44 - 1.00 mg/dL 0.84 0.99 1.09(H)  Sodium 135 - 145 mmol/L 134(L) 132(L) 136  Potassium 3.5 - 5.1 mmol/L 3.5 2.7(LL) 3.7  Chloride 98 - 111 mmol/L 99 93(L) 96(L)  CO2 22 - 32 mmol/L 27 26 28   Calcium 8.9 - 10.3 mg/dL 8.9 8.9 10.4(H)  Total Protein 6.5 - 8.1 g/dL - 7.0 7.7  Total Bilirubin 0.3 - 1.2 mg/dL - 0.6 0.7  Alkaline Phos 38 - 126 U/L - 67 82  AST 15 - 41 U/L - 42(H) 16  ALT 0 - 44 U/L - 20 <6    Lab Results  Component Value Date   WBC 11.9 (H) 02/20/2020   HGB 6.9 (LL) 02/20/2020   HCT 21.6 (L) 02/20/2020   MCV 105.9 (H) 02/20/2020   PLT 238 02/20/2020   NEUTROABS 11.5 (H) 02/18/2020    CT Soft Tissue Neck W Contrast  Result Date: 02/07/2020 CLINICAL DATA:  Metastatic lung cancer EXAM: CT NECK WITH CONTRAST TECHNIQUE:  Multidetector CT imaging of the neck was performed using the standard protocol following the bolus administration of intravenous contrast. CONTRAST:  157m OMNIPAQUE IOHEXOL 300 MG/ML  SOLN COMPARISON:  Correlation made with prior CT chest and MRI brain FINDINGS: Motion and streak artifact are present on several slices. Pharynx and larynx: Unremarkable.  No mass or swelling. Salivary glands: Unremarkable. Thyroid: Normal. Lymph nodes: No enlarged lymph nodes. Vascular: Major neck vessels are patent. Limited intracranial: Punctate foci of enhancement in the left cerebellum may correspond to known metastases. Visualized orbits: Not included. Mastoids and visualized paranasal sinuses: Minor mucosal thickening. Visualized mastoid air cells are clear.  Skeleton: Mild degenerative changes. No aggressive osseous lesion. An area of lucency within the T2 vertebral body is not definitely present on prior imaging (series 7, image 54). Upper chest: Dictated separately. Other: None. IMPRESSION: No neck mass or adenopathy. Small area of lucency within the T2 vertebral body appears new compared to October chest CT and may reflect a metastatic lesion. Electronically Signed   By: PMacy MisM.D.   On: 02/07/2020 16:39   CT Chest W Contrast  Result Date: 02/07/2020 CLINICAL DATA:  Restaging metastatic lung cancer (poorly differentiated adenocarcinoma). EXAM: CT CHEST, ABDOMEN, AND PELVIS WITH CONTRAST TECHNIQUE: Multidetector CT imaging of the chest, abdomen and pelvis was performed following the standard protocol during bolus administration of intravenous contrast. CONTRAST:  1050mOMNIPAQUE IOHEXOL 300 MG/ML  SOLN COMPARISON:  Prior CTs 11/16/2019 and 08/22/2019. FINDINGS: CT CHEST FINDINGS Neck findings are dictated separately. Cardiovascular: Right IJ Port-A-Cath extends to the mid right atrial level. No acute vascular findings are seen. There is mild extrinsic compression of the pulmonary arteries centrally by the enlarging central mediastinal process. There is mild aortic atherosclerosis. The heart size is stable. There is a stable small pericardial effusion. Mediastinum/Nodes: There is progressive ill-defined confluent soft tissue in the AP window no suspicious for progressive adenopathy. This measures up to 5.0 x 3.3 cm on image 21/2. Some of this may reflect complex fluid in the superior pericardial recess, although the fluid more inferiorly around the heart appears relatively simple. The thyroid gland, trachea and esophagus demonstrate no significant findings. Lungs/Pleura: Stable trace left pleural effusion. No right pleural effusion or pneumothorax. There are innumerable pulmonary nodules bilaterally with further enlargement. Representative nodules include  a right lower lobe nodule measuring 3.1 x 2.6 cm on image 86/7 (previously 1.6 cm maximally) and a 2.2 x 1.4 cm left lower lobe nodule on image 67/7 (previously 1.0 cm maximally). Chronic opacity medially at the left lung base is similar to the prior study, likely due to chronic atelectasis and/or scarring. Patchy peribronchovascular opacities in both lungs are not significantly changed, likely treatment related. Musculoskeletal/Chest wall: There is a new lytic metastasis involving the right 6th rib posteriorly with an associated pathologic fracture (image 40/7). There are new lytic lesions within the spine, most prominent within the T9 vertebral body (image 78/6). This lesion measures up to 1.7 cm on axial image 36/2 and is associated with a small anterior epidural component, not expected to cause cord compression at this time. There are probable additional smaller lesions at T1 and T2. CT ABDOMEN AND PELVIS FINDINGS Hepatobiliary: New clustered low-density lesions posteriorly in the right hepatic lobe measuring up to 1.2 cm on image 46/2, suspicious for metastatic disease. No evidence of gallstones, gallbladder wall thickening or biliary dilatation. Pancreas: Unremarkable. No pancreatic ductal dilatation or surrounding inflammatory changes. Spleen: Normal in size without focal abnormality. Adrenals/Urinary Tract: Both adrenal  glands appear normal. The kidneys appear normal without evidence of urinary tract calculus, suspicious lesion or hydronephrosis. No bladder abnormalities are seen. Stomach/Bowel: The stomach appears unremarkable for its degree of distension. No evidence of bowel wall thickening, distention or surrounding inflammatory change. Moderate stool throughout the colon. Vascular/Lymphatic: There are no enlarged abdominal or pelvic lymph nodes. Mild aortic and branch vessel atherosclerosis without acute vascular findings. Reproductive: Hysterectomy.  No adnexal mass. Other: No ascites or peritoneal  nodularity.  Intact abdominal wall. Musculoskeletal: There are few scattered new low-density osseous lesions most notably in the L5 vertebral body, upper sacrum and right iliac bone, suspicious for small metastases. No pathologic fracture. IMPRESSION: 1. Progressive pulmonary metastatic disease. 2. Progressive ill-defined confluent soft tissue in the AP window, suspicious for progressive adenopathy. 3. New lytic lesions within the spine, right 6th rib and right iliac bone, consistent with metastatic disease. There is a small anterior epidural component associated with a lesion in the T9 vertebral body, not expected to cause cord compression at this time. 4. New clustered low-density lesions posteriorly in the right hepatic lobe, suspicious for metastatic disease. 5. Neck findings are dictated separately. 6. Aortic Atherosclerosis (ICD10-I70.0). Electronically Signed   By: Nancy Sale M.D.   On: 02/07/2020 17:08   CT Angio Chest PE W/Cm &/Or Wo Cm  Result Date: 02/18/2020 CLINICAL DATA:  Chest pain, short of breath, fever, lethargy, history of stage IV metastatic lung cancer EXAM: CT ANGIOGRAPHY CHEST WITH CONTRAST TECHNIQUE: Multidetector CT imaging of the chest was performed using the standard protocol during bolus administration of intravenous contrast. Multiplanar CT image reconstructions and MIPs were obtained to evaluate the vascular anatomy. CONTRAST:  181m OMNIPAQUE IOHEXOL 350 MG/ML SOLN COMPARISON:  02/07/2020 FINDINGS: Cardiovascular: This is a technically adequate evaluation of the pulmonary vasculature. There is a segmental right upper lobe pulmonary embolus. Minimal clot burden. No other filling defects. Heart is not enlarged. Small pericardial effusion not significantly changed since prior study. Stable aneurysmal dilation of the ascending thoracic aorta measuring 3.7 cm. No dissection. Minimal atherosclerosis. Mediastinum/Nodes: Soft tissue mass in the AP window now measures up to 5.3 cm in  diameter, previously having measured 5.1 cm, consistent with progressive adenopathy. Increasing mass effect upon the trachea and left mainstem bronchus, though airways remain patent. Thyroid and esophagus are grossly unremarkable. Lungs/Pleura: The diffuse pulmonary metastases of not changed significantly in size or number since the prior exam performed 11 days ago. Small bilateral pleural effusions are noted, less than 500 cc each. Diffuse interstitial prominence throughout the left lung is concerning for lymphangitic spread of disease given the progressive mediastinal adenopathy. No pneumothorax. Upper Abdomen: Metastatic lesion within the right lobe liver measures up to 2 cm on image 101/4, previously measuring 1.7 cm. Musculoskeletal: The metastatic lesion within the right posterior sixth rib with associated pathologic fracture is again noted unchanged. Mottled lucencies throughout the thoracic spine, most pronounced at T9, consistent with metastatic disease. No new fractures. Review of the MIP images confirms the above findings. IMPRESSION: 1. Single segmental right upper lobe pulmonary embolus. Minimal clot burden. No right heart strain. 2. Diffuse metastatic disease, with innumerable bilateral pulmonary nodules and masses not appreciably changed since recent exam. 3. Slight enlargement of the AP window adenopathy, with mass effect upon the trachea and left mainstem bronchus as above. Diffuse interstitial prominence throughout the left lung concerning for lymphangitic spread of disease. 4. Increased size of right lobe liver metastasis seen previously. 5. Stable bony metastatic disease as above. 6.  Stable pericardial effusion. Critical Value/emergent results were called by telephone at the time of interpretation on 02/18/2020 at 6:43 pm to provider PA Nancy Moore, who verbally acknowledged these results. Electronically Signed   By: Nancy Ngo M.D.   On: 02/18/2020 18:44   MR Brain W Wo Contrast  Result  Date: 02/10/2020 CLINICAL DATA:  Non-small cell lung cancer staging. EXAM: MRI HEAD WITHOUT AND WITH CONTRAST TECHNIQUE: Multiplanar, multiecho pulse sequences of the brain and surrounding structures were obtained without and with intravenous contrast. CONTRAST:  8 mL Gadavist COMPARISON:  11/30/2019 FINDINGS: Brain: The enhancing cerebral and cerebellar lesions on the prior study have all resolved aside from at most a punctate focus of subtle residual enhancement in the superior aspect of the right cerebellar hemisphere (series 24, image 52). New enhancing lesions measure 2 mm in the left frontal lobe (series 24, image 84), 3 mm in the left frontal lobe (series 24, image 104), 3 mm in the right frontal lobe (series 24, image 102), and 2 mm in the right frontal lobe (series 24, image 102). There is no associated edema with any of these lesions. Numerous scattered foci of chronic microhemorrhage are noted throughout the brain related to previously treated metastases. The ventricles and sulci are normal in size. No acute infarct, midline shift, or extra-axial fluid collection is identified. Vascular: Major intracranial vascular flow voids are preserved. Skull and upper cervical spine: Several new subcentimeter enhancing skull lesions with the largest measuring 1 cm in the midline parietal bone (series 24, image 130). Sinuses/Orbits: Small right maxillary sinus mucous retention cyst. Clear mastoid air cells. Unremarkable orbits. Other: None. IMPRESSION: 1. Four new enhancing cerebral lesions consistent with metastases. No edema. 2. Several new subcentimeter skull metastases. 3. Essentially complete resolution of enhancing metastases on the prior study. Electronically Signed   By: Nancy Bores M.D.   On: 02/10/2020 14:44   CT Abdomen Pelvis W Contrast  Result Date: 02/07/2020 CLINICAL DATA:  Restaging metastatic lung cancer (poorly differentiated adenocarcinoma). EXAM: CT CHEST, ABDOMEN, AND PELVIS WITH CONTRAST  TECHNIQUE: Multidetector CT imaging of the chest, abdomen and pelvis was performed following the standard protocol during bolus administration of intravenous contrast. CONTRAST:  123m OMNIPAQUE IOHEXOL 300 MG/ML  SOLN COMPARISON:  Prior CTs 11/16/2019 and 08/22/2019. FINDINGS: CT CHEST FINDINGS Neck findings are dictated separately. Cardiovascular: Right IJ Port-A-Cath extends to the mid right atrial level. No acute vascular findings are seen. There is mild extrinsic compression of the pulmonary arteries centrally by the enlarging central mediastinal process. There is mild aortic atherosclerosis. The heart size is stable. There is a stable small pericardial effusion. Mediastinum/Nodes: There is progressive ill-defined confluent soft tissue in the AP window no suspicious for progressive adenopathy. This measures up to 5.0 x 3.3 cm on image 21/2. Some of this may reflect complex fluid in the superior pericardial recess, although the fluid more inferiorly around the heart appears relatively simple. The thyroid gland, trachea and esophagus demonstrate no significant findings. Lungs/Pleura: Stable trace left pleural effusion. No right pleural effusion or pneumothorax. There are innumerable pulmonary nodules bilaterally with further enlargement. Representative nodules include a right lower lobe nodule measuring 3.1 x 2.6 cm on image 86/7 (previously 1.6 cm maximally) and a 2.2 x 1.4 cm left lower lobe nodule on image 67/7 (previously 1.0 cm maximally). Chronic opacity medially at the left lung base is similar to the prior study, likely due to chronic atelectasis and/or scarring. Patchy peribronchovascular opacities in both lungs are not significantly changed,  likely treatment related. Musculoskeletal/Chest wall: There is a new lytic metastasis involving the right 6th rib posteriorly with an associated pathologic fracture (image 40/7). There are new lytic lesions within the spine, most prominent within the T9 vertebral  body (image 78/6). This lesion measures up to 1.7 cm on axial image 36/2 and is associated with a small anterior epidural component, not expected to cause cord compression at this time. There are probable additional smaller lesions at T1 and T2. CT ABDOMEN AND PELVIS FINDINGS Hepatobiliary: New clustered low-density lesions posteriorly in the right hepatic lobe measuring up to 1.2 cm on image 46/2, suspicious for metastatic disease. No evidence of gallstones, gallbladder wall thickening or biliary dilatation. Pancreas: Unremarkable. No pancreatic ductal dilatation or surrounding inflammatory changes. Spleen: Normal in size without focal abnormality. Adrenals/Urinary Tract: Both adrenal glands appear normal. The kidneys appear normal without evidence of urinary tract calculus, suspicious lesion or hydronephrosis. No bladder abnormalities are seen. Stomach/Bowel: The stomach appears unremarkable for its degree of distension. No evidence of bowel wall thickening, distention or surrounding inflammatory change. Moderate stool throughout the colon. Vascular/Lymphatic: There are no enlarged abdominal or pelvic lymph nodes. Mild aortic and branch vessel atherosclerosis without acute vascular findings. Reproductive: Hysterectomy.  No adnexal mass. Other: No ascites or peritoneal nodularity.  Intact abdominal wall. Musculoskeletal: There are few scattered new low-density osseous lesions most notably in the L5 vertebral body, upper sacrum and right iliac bone, suspicious for small metastases. No pathologic fracture. IMPRESSION: 1. Progressive pulmonary metastatic disease. 2. Progressive ill-defined confluent soft tissue in the AP window, suspicious for progressive adenopathy. 3. New lytic lesions within the spine, right 6th rib and right iliac bone, consistent with metastatic disease. There is a small anterior epidural component associated with a lesion in the T9 vertebral body, not expected to cause cord compression at this  time. 4. New clustered low-density lesions posteriorly in the right hepatic lobe, suspicious for metastatic disease. 5. Neck findings are dictated separately. 6. Aortic Atherosclerosis (ICD10-I70.0). Electronically Signed   By: Nancy Sale M.D.   On: 02/07/2020 17:08   DG Chest Port 1 View  Result Date: 02/18/2020 CLINICAL DATA:  Stage IV lung cancer EXAM: PORTABLE CHEST 1 VIEW COMPARISON:  CT chest dated 02/07/2020 FINDINGS: Patchy/masslike opacity in the left lower lobe with numerous bilateral pulmonary nodules, better evaluated on prior CT. Suspected small left pleural effusion. No pneumothorax. Cardiomegaly. Right chest power port terminates at the cavoatrial junction. IMPRESSION: Patchy/masslike opacity in the left lower lobe, better evaluated on prior CT. Numerous bilateral pulmonary metastases. Suspected small left pleural effusion. Electronically Signed   By: Nancy Hy M.D.   On: 02/18/2020 16:15   ECHOCARDIOGRAM COMPLETE  Result Date: 02/19/2020    ECHOCARDIOGRAM REPORT   Patient Name:   Nancy Moore Date of Exam: 02/19/2020 Medical Rec #:  [id number]     Height:       66.0 in Accession #:    [id number]    Weight:       167.3 lb Date of Birth:  December 21, 1965      BSA:          1.854 m Patient Age:    32 years      BP:           109/69 mmHg Patient Gender: F             HR:           102 bpm. Exam Location:  Inpatient Procedure: 2D Echo  Indications:    Pulmonary Embolus I26.99  History:        Patient has prior history of Echocardiogram examinations, most                 recent 01/10/2017. Lung cancer, Signs/Symptoms:Dyspnea; Risk                 Factors:Hypertension.  Sonographer:    Mikki Santee RDCS (AE) Referring Phys: [place]  1. There is a false tendon in the mid LV cavity of no clinical significance. . Left ventricular ejection fraction, by estimation, is 60 to 65%. The left ventricle has normal function. The left ventricle has no regional wall motion  abnormalities. Left ventricular diastolic parameters were normal.  2. Right ventricular systolic function is normal. The right ventricular size is normal. Tricuspid regurgitation signal is inadequate for assessing PA pressure.  3. A small pericardial effusion is present. The pericardial effusion is localized mainly near the right atrium but also circumferential.  4. The mitral valve is normal in structure. Mild mitral valve regurgitation. No evidence of mitral stenosis.  5. The aortic valve is normal in structure. Aortic valve regurgitation is trivial. No aortic stenosis is present.  6. The inferior vena cava is normal in size with greater than 50% respiratory variability, suggesting right atrial pressure of 3 mmHg. FINDINGS  Left Ventricle: There is a false tendon in the mid LV cavity of no clinical significance. Left ventricular ejection fraction, by estimation, is 60 to 65%. The left ventricle has normal function. The left ventricle has no regional wall motion abnormalities. The left ventricular internal cavity size was normal in size. There is no left ventricular hypertrophy. Left ventricular diastolic parameters were normal. Normal left ventricular filling pressure. Right Ventricle: The right ventricular size is normal. No increase in right ventricular wall thickness. Right ventricular systolic function is normal. Tricuspid regurgitation signal is inadequate for assessing PA pressure. Left Atrium: Left atrial size was normal in size. Right Atrium: Right atrial size was normal in size. Pericardium: A small pericardial effusion is present. The pericardial effusion is localized near the right atrium and circumferential. Mitral Valve: The mitral valve is normal in structure. Mild mitral valve regurgitation. No evidence of mitral valve stenosis. Tricuspid Valve: The tricuspid valve is normal in structure. Tricuspid valve regurgitation is trivial. No evidence of tricuspid stenosis. Aortic Valve: The aortic valve is  normal in structure. Aortic valve regurgitation is trivial. No aortic stenosis is present. Pulmonic Valve: The pulmonic valve was normal in structure. Pulmonic valve regurgitation is trivial. No evidence of pulmonic stenosis. Aorta: The aortic root is normal in size and structure. Venous: The inferior vena cava is normal in size with greater than 50% respiratory variability, suggesting right atrial pressure of 3 mmHg. IAS/Shunts: No atrial level shunt detected by color flow Doppler.  LEFT VENTRICLE PLAX 2D LVIDd:         5.00 cm  Diastology LVIDs:         3.30 cm  LV e' medial:    8.38 cm/s LV PW:         0.80 cm  LV E/e' medial:  9.8 LV IVS:        0.70 cm  LV e' lateral:   8.05 cm/s LVOT diam:     2.00 cm  LV E/e' lateral: 10.2 LV SV:         43 LV SV Index:   23 LVOT Area:     3.14 cm  RIGHT VENTRICLE RV S prime:     11.20 cm/s TAPSE (M-mode): 1.4 cm LEFT ATRIUM           Index       RIGHT ATRIUM           Index LA diam:      3.40 cm 1.83 cm/m  RA Area:     12.40 cm LA Vol (A2C): 20.4 ml 11.00 ml/m RA Volume:   25.80 ml  13.92 ml/m LA Vol (A4C): 42.9 ml 23.14 ml/m  AORTIC VALVE LVOT Vmax:   96.00 cm/s LVOT Vmean:  68.000 cm/s LVOT VTI:    0.137 m  AORTA Ao Root diam: 3.10 cm MITRAL VALVE MV Area (PHT): 4.36 cm    SHUNTS MV Decel Time: 174 msec    Systemic VTI:  0.14 m MV E velocity: 82.30 cm/s  Systemic Diam: 2.00 cm MV A velocity: 56.10 cm/s MV E/A ratio:  1.47 Nancy Moore Electronically signed by Nancy Moore Signature Date/Time: 02/19/2020/11:52:09 AM    Final    VAS [place] LOWER EXTREMITY VENOUS (DVT)  Result Date: 02/19/2020  Lower Venous DVT Study Indications: Pulmonary embolism, and SOB.  Risk Factors: Cancer Brains Mets. Anticoagulation: Xarelto. Comparison Study: No previous lower ext. Performing Technologist: Vonzell Schlatter RVT  Examination Guidelines: A complete evaluation includes B-mode imaging, spectral Doppler, color Doppler, and power Doppler as needed of all accessible portions of each  vessel. Bilateral testing is considered an integral part of a complete examination. Limited examinations for reoccurring indications may be performed as noted. The reflux portion of the exam is performed with the patient in reverse Trendelenburg.  +---------+---------------+---------+-----------+----------+--------------+ RIGHT    CompressibilityPhasicitySpontaneityPropertiesThrombus Aging +---------+---------------+---------+-----------+----------+--------------+ CFV      Full           Yes      Yes                                 +---------+---------------+---------+-----------+----------+--------------+ SFJ      Full                                                        +---------+---------------+---------+-----------+----------+--------------+ FV Prox  Full                                                        +---------+---------------+---------+-----------+----------+--------------+ FV Mid   Full                                                        +---------+---------------+---------+-----------+----------+--------------+ FV DistalFull                                                        +---------+---------------+---------+-----------+----------+--------------+ PFV      Full                                                        +---------+---------------+---------+-----------+----------+--------------+  POP      Full           Yes      Yes                                 +---------+---------------+---------+-----------+----------+--------------+ PTV      Full                                                        +---------+---------------+---------+-----------+----------+--------------+ PERO     Full                                                        +---------+---------------+---------+-----------+----------+--------------+   +---------+---------------+---------+-----------+----------+--------------+ LEFT      CompressibilityPhasicitySpontaneityPropertiesThrombus Aging +---------+---------------+---------+-----------+----------+--------------+ CFV      Full           Yes      Yes                                 +---------+---------------+---------+-----------+----------+--------------+ SFJ      Full                                                        +---------+---------------+---------+-----------+----------+--------------+ FV Prox  Full                                                        +---------+---------------+---------+-----------+----------+--------------+ FV Mid   Full                                                        +---------+---------------+---------+-----------+----------+--------------+ FV DistalFull                                                        +---------+---------------+---------+-----------+----------+--------------+ PFV      Full                                                        +---------+---------------+---------+-----------+----------+--------------+ POP      Full           Yes      Yes                                 +---------+---------------+---------+-----------+----------+--------------+  PTV      None                                         Acute          +---------+---------------+---------+-----------+----------+--------------+ PERO     None                                         Acute          +---------+---------------+---------+-----------+----------+--------------+  Summary: RIGHT: - There is no evidence of deep vein thrombosis in the lower extremity.  - No cystic structure found in the popliteal fossa.  LEFT: - Findings consistent with acute deep vein thrombosis involving the left posterior tibial veins, and left peroneal veins. - No cystic structure found in the popliteal fossa.  *See table(s) above for measurements and observations. Electronically signed by Nancy Moore on 02/19/2020  at 4:28:15 PM.    Final     ASSESSMENT AND PLAN: This is a very pleasant 55 years old African-American female with metastatic non-small cell lung cancer, adenocarcinoma and positive for EGFR mutation with deletion in exon 19.  She initially received treatment with Gilotrif 40 mg daily and completed 13 months of treatment.  She developed disease progression and was switched to Tagrisso 80 mg daily and received about 12 months of treatment.  Imaging showed concern for disease progression with lymphangitic spread of the disease.  She was then referred to Dr. Durenda Hurt at [facility] and repeat molecular studies showed emergence of the BRAF mutation V600E.  She was started on systemic chemotherapy with carboplatin for an AUC of 5, Alimta 500 mg meter squared, and Avastin 15 mg/kg every 3 weeks.  Starting for cycle #7, she received maintenance treatment with Alimta and Avastin every 3 weeks.  Status post 13 cycles.  She also continued on treatment with Tagrisso 80 mg daily.  She again had disease progression and molecular studies by guardant 360 showed persistence of the EGFR mutation with deletion in the exon 19 but there was also ATM [id number] mutation which may be targetable with olaparib.  She was started on olaparib 300 mg twice daily on 12/08/2019.  Recent restaging scans showed difficult disease progression.  Treatment options were reviewed with the patient and her sister at her last visit with [place] including referral to palliative care and hospice versus consideration of second line systemic chemotherapy with docetaxel 75 mg/meter squared and Cyramza 10 mg/kg every 3 weeks versus resuming treatment with EGFR tyrosine kinase inhibitor again with either Tagrisso or Tarceva.  The patient wanted to think about her options.  She also want to reach back out to Dr. Durenda Hurt to see if he any additional recommendations.  At this point, the patient is awaiting follow-up with Dr. Durenda Hurt.  She was restarted on her  [place] for the time being until she made a final decision.  She is now admitted with fever and pulmonary embolism.  She met SIRS criteria on admission.  Clinically, the patient is improving.  We will continue to hold Tagrisso until cultures return is negative.  I again reviewed recommendations per Dr. Julien Nordmann regarding her treatment options.  She has questions regarding the chemotherapy regimen and also about hospice services.  All this was discussed in detail with the patient.  At  this point, she would like to touch base with Dr. Durenda Hurt to hear his recommendations regarding treatment of her metastatic lung cancer.  This appointment is being arranged.  Recommend continued treatment of her acute medical issues.  Continue Xarelto for PE.  The patient is aware to call our office once she has had a chance to meet with Dr. Durenda Hurt and has made a final decision about how she would like to proceed.   LOS: 1 day   Mikey Bussing, DNP, AGPCNP-BC, AOCNP 02/20/20

## 2020-02-20 NOTE — Progress Notes (Signed)
OT Cancellation Note  Patient Details Name: Nancy Moore MRN: [id number] DOB: 17-Dec-1965   Cancelled Treatment:    Reason Eval/Treat Not Completed: Other (comment). Pt to receive blood and has not yet gotten it, will await until after blood given since yesterday she was fatigued and dizzy during PT eval.   [place], OTR/L Acute Rehab Services Pager [phone] Office [phone]     Almon Register 02/20/2020, 8:27 AM

## 2020-02-21 ENCOUNTER — Other Ambulatory Visit: Payer: Self-pay | Admitting: Radiation Therapy

## 2020-02-21 ENCOUNTER — Telehealth: Payer: Self-pay | Admitting: Radiation Therapy

## 2020-02-21 DIAGNOSIS — C7931 Secondary malignant neoplasm of brain: Secondary | ICD-10-CM

## 2020-02-21 DIAGNOSIS — C7949 Secondary malignant neoplasm of other parts of nervous system: Secondary | ICD-10-CM

## 2020-02-21 LAB — CBC
HCT: 28.2 % — ABNORMAL LOW (ref 36.0–46.0)
Hemoglobin: 8.8 g/dL — ABNORMAL LOW (ref 12.0–15.0)
MCH: 32.4 pg (ref 26.0–34.0)
MCHC: 31.2 g/dL (ref 30.0–36.0)
MCV: 103.7 fL — ABNORMAL HIGH (ref 80.0–100.0)
Platelets: 302 10*3/uL (ref 150–400)
RBC: 2.72 MIL/uL — ABNORMAL LOW (ref 3.87–5.11)
RDW: 23.4 % — ABNORMAL HIGH (ref 11.5–15.5)
WBC: 12.7 10*3/uL — ABNORMAL HIGH (ref 4.0–10.5)
nRBC: 0.3 % — ABNORMAL HIGH (ref 0.0–0.2)

## 2020-02-21 LAB — BASIC METABOLIC PANEL
Anion gap: 12 (ref 5–15)
BUN: 7 mg/dL (ref 6–20)
CO2: 27 mmol/L (ref 22–32)
Calcium: 8.9 mg/dL (ref 8.9–10.3)
Chloride: 98 mmol/L (ref 98–111)
Creatinine, Ser: 0.86 mg/dL (ref 0.44–1.00)
GFR, Estimated: >60 mL/min (ref >60)
Glucose, Bld: 96 mg/dL (ref 70–99)
Potassium: 3.4 mmol/L — ABNORMAL LOW (ref 3.5–5.1)
Sodium: 137 mmol/L (ref 135–145)

## 2020-02-21 LAB — TYPE AND SCREEN
ABO/RH(D): O NEG
Antibody Screen: NEGATIVE
Unit division: 0

## 2020-02-21 LAB — BPAM RBC
Blood Product Expiration Date: 202202182359
ISSUE DATE / TIME: 202201241026
Unit Type and Rh: 9500

## 2020-02-21 NOTE — Progress Notes (Signed)
Physical Therapy Treatment Patient Details Name: LAQUESHIA CIHLAR MRN: [id number] DOB: December 22, 1965 Today's Date: 02/21/2020    History of Present Illness 55 y.o. female with pertinent past medical history of stage IV non-small cell lung cancer  on oral chemotherapy, hypertension, arthritis, anemia that presents to the emergency department today via EMS for fever.    PT Comments    Pt slow to answer questions however able to ambulate in hallway today.  Sister present and observed.  Follow Up Recommendations  Home health PT     Equipment Recommendations  Rolling walker with 5" wheels    Recommendations for Other Services       Precautions / Restrictions Precautions Precautions: Fall    Mobility  Bed Mobility               General bed mobility comments: pt in recliner; nasal cannula out of nose upon arrival to room and SPO2 93% on room air however pt reapplied for ambulation  Transfers Overall transfer level: Needs assistance Equipment used:  Transfers: Sit to/from Stand;Stand Pivot Transfers Sit to Stand: Min guard         General transfer comment: min/guard for safety, cues for hand placement  Ambulation/Gait Ambulation/Gait assistance: Min guard Gait Distance (Feet): 400 Feet Assistive device: None;IV Pole Gait Pattern/deviations: Step-through pattern;Decreased stride length     General Gait Details: slow but steady, pt utilized IV pole at first however feet of pole more in the way for pt so therapist pushed pole and tank and pt able to ambulate without UE support, SPO2 95% on 1L O2 [place] upon return to room, so left pt on 1L and RN aware   Stairs             Wheelchair Mobility    Modified Rankin (Stroke Patients Only)       Balance                                            Cognition Arousal/Alertness: Awake/alert Behavior During Therapy: WFL for tasks assessed/performed Overall Cognitive Status: Within Functional Limits for  tasks assessed                                        Exercises      General Comments        Pertinent Vitals/Pain Pain Assessment: No/denies pain    Home Living                      Prior Function            PT Goals (current goals can now be found in the care plan section) Progress towards PT goals: Progressing toward goals    Frequency    Min 3X/week      PT Plan Current plan remains appropriate    Co-evaluation              AM-PAC PT "6 Clicks" Mobility   Outcome Measure  Help needed turning from your back to your side while in a flat bed without using bedrails?: A Little Help needed moving from lying on your back to sitting on the side of a flat bed without using bedrails?: A Little Help needed moving to and from a bed to a chair (including  a wheelchair)?: A Little Help needed standing up from a chair using your arms (e.g., wheelchair or bedside chair)?: A Little Help needed to walk in hospital room?: A Little Help needed climbing 3-5 steps with a railing? : A Lot 6 Click Score: 17    End of Session   Activity Tolerance: Patient tolerated treatment well Patient left: in chair;with call bell/phone within reach;with chair alarm set;with family/visitor present Nurse Communication: Mobility status PT Visit Diagnosis: Other abnormalities of gait and mobility (R26.89)     Time: 8721-5872 PT Time Calculation (min) (ACUTE ONLY): 28 min  Charges:  $Gait Training: 23-37 mins                    Jannette Spanner PT, DPT Acute Rehabilitation Services Pager: [phone] Office: [phone]  Trena Platt 02/21/2020, 12:58 PM

## 2020-02-21 NOTE — Progress Notes (Signed)
Orders entered to access port the day of brain MRI at [place].   Mont Dutton R.T.(R)(T) Radiation Special Procedures Navigator

## 2020-02-21 NOTE — Plan of Care (Signed)
  Problem: Education: Goal: Knowledge of General Education information will improve Description: Including pain rating scale, medication(s)/side effects and non-pharmacologic comfort measures Outcome: Progressing   Problem: Nutrition: Goal: Adequate nutrition will be maintained Outcome: Progressing   Problem: Elimination: Goal: Will not experience complications related to urinary retention Outcome: Progressing   Problem: Pain Managment: Goal: General experience of comfort will improve Outcome: Progressing   Problem: Safety: Goal: Ability to remain free from injury will improve Outcome: Progressing   Problem: Skin Integrity: Goal: Risk for impaired skin integrity will decrease Outcome: Progressing

## 2020-02-21 NOTE — Evaluation (Signed)
Occupational Therapy Evaluation Patient Details Name: Nancy Moore MRN: [id number] DOB: 01/09/1966 Today's Date: 02/21/2020    History of Present Illness 55 y.o. female with pertinent past medical history of stage IV non-small cell lung cancer  on oral chemotherapy, hypertension, arthritis, anemia that presents to the emergency department today via EMS for fever.   Clinical Impression   Pt is typically independent in home setting for ADL/IADL. Today she is overall min guard. Able to complete LB ADL at min guard from seated position, toilet transfer pushing IV pole, 1 standing grooming task. Pt will benefit from skilled OT in the acute setting to maximize safety and independence in ADL and functional transfers. Next session to focus on energy conservation education (please take handout)    Follow Up Recommendations  Supervision/Assistance - 24 hour (initially)    Equipment Recommendations  None recommended by OT    Recommendations for Other Services       Precautions / Restrictions Precautions Precautions: Fall Restrictions Weight Bearing Restrictions: No      Mobility Bed Mobility               General bed mobility comments: Sister present throughout session    Transfers Overall transfer level: Needs assistance Equipment used: Rolling walker (2 wheeled) Transfers: Sit to/from [facility] Sit to Stand: Min guard         General transfer comment: min/guard for safety, cues for hand placement    Balance Overall balance assessment: Needs assistance Sitting-balance support: Feet supported;No upper extremity supported Sitting balance-Leahy Scale: Fair Sitting balance - Comments: unchallenged     Standing balance-Leahy Scale: Poor Standing balance comment: reliant on UEs                           ADL either performed or assessed with clinical judgement   ADL Overall ADL's : Needs assistance/impaired Eating/Feeding: Independent    Grooming: Min guard;Standing   Upper Body Bathing: Set up;Sitting   Lower Body Bathing: Min guard;Sitting/lateral leans;Sit to/from stand Lower Body Bathing Details (indicate cue type and reason): increased time required Upper Body Dressing : Set up;Sitting   Lower Body Dressing: Min guard;Sit to/from stand   Toilet Transfer: Min guard;Ambulation   Toileting- Clothing Manipulation and Hygiene: Supervision/safety;Sitting/lateral lean       Functional mobility during ADLs: Min guard General ADL Comments: increased time, education on energy conservation     Vision Baseline Vision/History: Wears glasses Patient Visual Report: No change from baseline       Perception     Praxis      Pertinent Vitals/Pain Pain Assessment: No/denies pain     Hand Dominance     Extremity/Trunk Assessment Upper Extremity Assessment Upper Extremity Assessment: Generalized weakness   Lower Extremity Assessment Lower Extremity Assessment: Generalized weakness       Communication Communication Communication: No difficulties   Cognition Arousal/Alertness: Awake/alert Behavior During Therapy: WFL for tasks assessed/performed Overall Cognitive Status: Within Functional Limits for tasks assessed                                     General Comments       Exercises     Shoulder Instructions      Home Living Family/patient expects to be discharged to:: Private residence Living Arrangements: Alone Available Help at Discharge: Family;Available 24 hours/day Type of Home: Apartment (handicapped accessible)  Home Access: Ramped entrance Entrance Stairs-Number of Steps: threshold   Home Layout: One level     Bathroom Shower/Tub: Tub/shower unit (does a lot of sponge baths)   Bathroom Toilet: Standard     Home Equipment: Tub bench   Additional Comments: sister states family is working on arranging 24/7 assist, plannning to put an extra bed in pt's one bedroom apt.       Prior Functioning/Environment Level of Independence: Independent        Comments: sister/family can provide 24/7 assist.        OT Problem List: Decreased activity tolerance;Decreased strength      OT Treatment/Interventions: Self-care/ADL training;Energy conservation;DME and/or AE instruction;Therapeutic activities;Patient/family education;Balance training    OT Goals(Current goals can be found in the care plan section) Acute Rehab OT Goals Patient Stated Goal: to get stronger OT Goal Formulation: With patient Time For Goal Achievement: 03/06/20 Potential to Achieve Goals: Good ADL Goals Pt Will Perform Grooming: with modified independence;standing Pt Will Perform Upper Body Dressing: with modified independence;sitting Pt Will Perform Lower Body Dressing: with modified independence;sit to/from stand Pt Will Transfer to Toilet: with modified independence;ambulating Pt Will Perform Toileting - Clothing Manipulation and hygiene: with modified independence;sit to/from stand Additional ADL Goal #1: Pt will verbalize 3 strategies for conserving energy during ADL routine  OT Frequency: Min 2X/week   Barriers to D/C:            Co-evaluation              AM-PAC OT "6 Clicks" Daily Activity     Outcome Measure Help from another person eating meals?: None Help from another person taking care of personal grooming?: A Little Help from another person toileting, which includes using toliet, bedpan, or urinal?: A Little Help from another person bathing (including washing, rinsing, drying)?: A Little Help from another person to put on and taking off regular upper body clothing?: A Little Help from another person to put on and taking off regular lower body clothing?: A Little 6 Click Score: 19   End of Session Equipment Utilized During Treatment: Gait belt Nurse Communication: Mobility status;Precautions  Activity Tolerance: Patient tolerated treatment well Patient left:  in chair;with call bell/phone within reach  OT Visit Diagnosis: Muscle weakness (generalized) (M62.81)                Time: 6073-7106 OT Time Calculation (min): 42 min Charges:  OT General Charges $OT Visit: 1 Visit OT Evaluation $OT Eval Moderate Complexity: 1 Mod OT Treatments $Self Care/Home Management : 23-37 mins  Jesse Sans OTR/L Acute Rehabilitation Services Pager: [phone] Office: [place] 02/21/2020, 3:26 PM

## 2020-02-21 NOTE — Progress Notes (Addendum)
PROGRESS NOTE    Nancy Moore  TGG:[id number] DOB: [birth year] DOA: 02/18/2020 PCP: Darreld Mclean, MD   Brief Narrative: Nancy Moore is a 55 y.o. female with a history of stage 4 NSCLC with metastasis to brain, lung and liver. Patient presented secondary to fever but while in the ED, was found to have an acute PE. Patient started on anticoagulation.   Assessment & Plan:   Principal Problem:   Acute pulmonary embolism (HCC) Active Problems:   Adenocarcinoma of left lung, stage 4 (HCC)   Primary malignant neoplasm of lung with metastasis to brain (HCC)   Anemia   SIRS (systemic inflammatory response syndrome) (HCC)  Acute pulmonary embolism Acute Left LE DVT In setting of underlying metastatic lung cancer. Started on Xarelto per hematology recommendations. No heart strain on CT scan. Transthoracic Echocardiogram significant for normal RV function. -Continue Xarelto  SIRS This appears to be at least in part secondary to acute PE. Patient with fever but no infectious source. Empirically started on Ceftriaxone and azithromycin for possible CAP. Blood cultures obtained with no growth to date. Elevated temperature with Tmax of 100.4 F overnight. No symptoms. Possibly related to tumor burden vs clot. WBC trending down on antibiotics -Follow up cultures -Continue Ceftriaxone/Azithromycin  Non-small cell lung cancer Metastasis to brain, liver, lung Previously on chemotherapy, now on directed therapy on Tagrisso. Follows with Dr. Julien Nordmann as an outpatient. Per oncology note, poor prognosis. Per patient, plan to follow-up with oncologist at [facility] for next steps. -Oncology recommendations: pending today. -Outpatient follow-up with Oncologist at [facility], Dr. Aniceto Boss  Hypokalemia Potassium of 2.7 on admission. Resolved with repletion.  Acute on chronic anemia anemia Unsure of why she has acute anemia. She does have many metastatic lesions. No overt evidence of bleeding. Received 1  unit of PRBC on 1/24 for a hemoglobin of 6.9 with repeat H&H of 8.5/26.2. CBC pending today -Follow-up CBC  Chronic pain -Continue Fentanyl patch, Oxycodone prn  Constipation -Continue Linzess -Miralax BID   DVT prophylaxis: Xarelto Code Status:   Code Status: DNR Family Communication: None at bedside Disposition Plan: Discharge home likely in 1-2 days pending stable hemoglobin, oncology recommendations.   Consultants:   Hematology/Oncology  Procedures:   TRANSTHORACIC ECHOCARDIOGRAM (02/19/2020) IMPRESSIONS    1. There is a false tendon in the mid LV cavity of no clinical  significance. . Left ventricular ejection fraction, by estimation, is 60  to 65%. The left ventricle has normal function. The left ventricle has no  regional wall motion abnormalities. Left  ventricular diastolic parameters were normal.  2. Right ventricular systolic function is normal. The right ventricular  size is normal. Tricuspid regurgitation signal is inadequate for assessing  PA pressure.  3. A small pericardial effusion is present. The pericardial effusion is  localized mainly near the right atrium but also circumferential.  4. The mitral valve is normal in structure. Mild mitral valve  regurgitation. No evidence of mitral stenosis.  5. The aortic valve is normal in structure. Aortic valve regurgitation is  trivial. No aortic stenosis is present.  6. The inferior vena cava is normal in size with greater than 50%  respiratory variability, suggesting right atrial pressure of 3 mmHg.  Antimicrobials:  Ceftriaxone  Azithromycin   Subjective: Some foot pain overnight which has improved a little this morning. No other issues.  Objective: Vitals:   02/20/20 2017 02/20/20 2051 02/21/20 0018 02/21/20 0349  BP: 124/72  106/71 114/71  Pulse: (!) 101 (!) 102 94  92  Resp: 17  17 16   Temp: (!) 100.4 F (38 C) 99.9 F (37.7 C) 99.9 F (37.7 C) 99.6 F (37.6 C)  TempSrc: Oral Oral  Oral Oral  SpO2: (!) 85% 94% 93% 95%  Weight:      Height:        Intake/Output Summary (Last 24 hours) at 02/21/2020 0943 Last data filed at 02/21/2020 0630 Gross per 24 hour  Intake 1883.83 ml  Output 850 ml  Net 1033.83 ml   Filed Weights   02/19/20 0404  Weight: 75.9 kg    Examination:  General exam: Appears calm and comfortable Respiratory system: Faint rales. Respiratory effort normal. Cardiovascular system: S1 & S2 heard, RRR. No murmurs, rubs, gallops or clicks. Gastrointestinal system: Abdomen is nondistended, soft and nontender. No organomegaly or masses felt. Normal bowel sounds heard. Central nervous system: Alert and oriented. No focal neurological deficits. Musculoskeletal: No edema. No calf tenderness Skin: No cyanosis. No rashes Psychiatry: Judgement and insight appear normal. Mood & affect appropriate.    Data Reviewed: I have personally reviewed following labs and imaging studies  CBC Lab Results  Component Value Date   WBC 11.9 (H) 02/20/2020   RBC 2.04 (L) 02/20/2020   HGB 8.5 (L) 02/20/2020   HCT 26.2 (L) 02/20/2020   MCV 105.9 (H) 02/20/2020   MCH 33.8 02/20/2020   PLT 238 02/20/2020   MCHC 31.9 02/20/2020   RDW 24.2 (H) 02/20/2020   LYMPHSABS 0.9 02/18/2020   MONOABS 2.0 (H) 02/18/2020   EOSABS 0.0 02/18/2020   BASOSABS 0.1 52/84/1324     Last metabolic panel Lab Results  Component Value Date   NA 137 02/21/2020   K 3.4 (L) 02/21/2020   CL 98 02/21/2020   CO2 27 02/21/2020   BUN 7 02/21/2020   CREATININE 0.86 02/21/2020   GLUCOSE 96 02/21/2020   GFRNONAA >60 02/21/2020   GFRAA >60 10/26/2019   CALCIUM 8.9 02/21/2020   PHOS 4.1 10/14/2018   PROT 7.0 02/18/2020   ALBUMIN 3.0 (L) 02/18/2020   BILITOT 0.6 02/18/2020   ALKPHOS 67 02/18/2020   AST 42 (H) 02/18/2020   ALT 20 02/18/2020   ANIONGAP 12 02/21/2020    CBG (last 3)  No results for input(s): GLUCAP in the last 72 hours.   GFR: Estimated Creatinine Clearance: 78.7  mL/min (by C-G formula based on SCr of 0.86 mg/dL).  Coagulation Profile: Recent Labs  Lab 02/18/20 1527  INR 1.4*    Recent Results (from the past 240 hour(s))  Culture, blood (Routine x 2)     Status: None (Preliminary result)   Collection Time: 02/18/20  3:27 PM   Specimen: BLOOD  Result Value Ref Range Status   Specimen Description   Final    BLOOD PORTA CATH Performed at [facility] [address], [place], [place] [address]    Special Requests   Final    BOTTLES DRAWN AEROBIC AND ANAEROBIC Blood Culture adequate volume Performed at [facility] [address], [place], [place] [address]    Culture   Final    NO GROWTH 2 DAYS Performed at [facility] [address], [place], [place] [address]    Report Status PENDING  Incomplete  Culture, blood (Routine x 2)     Status: None (Preliminary result)   Collection Time: 02/18/20  3:32 PM   Specimen: BLOOD  Result Value Ref Range Status   Specimen Description   Final  BLOOD LEFT ANTECUBITAL Performed at [facility] [address], [place], [place] [address]    Special Requests   Final    BOTTLES DRAWN AEROBIC AND ANAEROBIC Blood Culture results may not be optimal due to an excessive volume of blood received in culture bottles Performed at [facility] [address] [place], [place] [address]    Culture   Final    NO GROWTH 2 DAYS Performed at [facility] [address], [place], [place] [address]    Report Status PENDING  Incomplete  SARS Coronavirus 2 by RT PCR (hospital order, performed in [facility] hospital lab) Nasopharyngeal Nasopharyngeal Swab     Status: None   Collection Time: 02/18/20  3:41 PM   Specimen: Nasopharyngeal Swab  Result Value Ref Range Status   SARS Coronavirus 2 NEGATIVE NEGATIVE Final    Comment: (NOTE) SARS-CoV-2 target nucleic acids are NOT DETECTED.  The SARS-CoV-2 RNA is generally  detectable in upper and lower respiratory specimens during the acute phase of infection. The lowest concentration of SARS-CoV-2 viral copies this assay can detect is 250 copies / mL. A negative result does not preclude SARS-CoV-2 infection and should not be used as the sole basis for treatment or other patient management decisions.  A negative result may occur with improper specimen collection / handling, submission of specimen other than nasopharyngeal swab, presence of viral mutation(s) within the areas targeted by this assay, and inadequate number of viral copies (<250 copies / mL). A negative result must be combined with clinical observations, patient history, and epidemiological information.  Fact Sheet for Patients:   StrictlyIdeas.no  Fact Sheet for Healthcare Providers: BankingDealers.co.za  This test is not yet approved or  cleared by the [place] FDA and has been authorized for detection and/or diagnosis of SARS-CoV-2 by FDA under an Emergency Use Authorization (EUA).  This EUA will remain in effect (meaning this test can be used) for the duration of the COVID-19 declaration under Section 564(b)(1) of the Act, 21 U.S.C. section 360bbb-3(b)(1), unless the authorization is terminated or revoked sooner.  Performed at [facility], [place] [address] [place], [place] [address]         Radiology Studies: ECHOCARDIOGRAM COMPLETE  Result Date: 02/19/2020    ECHOCARDIOGRAM REPORT   Patient Name:   ANAROSA KUBISIAK Wickstrom Date of Exam: 02/19/2020 Medical Rec #:  [id number]     Height:       66.0 in Accession #:    [id number]    Weight:       167.3 lb Date of Birth:  1965/05/28      BSA:          1.854 m Patient Age:    34 years      BP:           109/69 mmHg Patient Gender: F             HR:           102 bpm. Exam Location:  Inpatient Procedure: 2D Echo Indications:    Pulmonary Embolus I26.99  History:        Patient has  prior history of Echocardiogram examinations, most                 recent 01/10/2017. Lung cancer, Signs/Symptoms:Dyspnea; Risk                 Factors:Hypertension.  Sonographer:    Mikki Santee RDCS (AE)  Referring Phys: [place]  1. There is a false tendon in the mid LV cavity of no clinical significance. . Left ventricular ejection fraction, by estimation, is 60 to 65%. The left ventricle has normal function. The left ventricle has no regional wall motion abnormalities. Left ventricular diastolic parameters were normal.  2. Right ventricular systolic function is normal. The right ventricular size is normal. Tricuspid regurgitation signal is inadequate for assessing PA pressure.  3. A small pericardial effusion is present. The pericardial effusion is localized mainly near the right atrium but also circumferential.  4. The mitral valve is normal in structure. Mild mitral valve regurgitation. No evidence of mitral stenosis.  5. The aortic valve is normal in structure. Aortic valve regurgitation is trivial. No aortic stenosis is present.  6. The inferior vena cava is normal in size with greater than 50% respiratory variability, suggesting right atrial pressure of 3 mmHg. FINDINGS  Left Ventricle: There is a false tendon in the mid LV cavity of no clinical significance. Left ventricular ejection fraction, by estimation, is 60 to 65%. The left ventricle has normal function. The left ventricle has no regional wall motion abnormalities. The left ventricular internal cavity size was normal in size. There is no left ventricular hypertrophy. Left ventricular diastolic parameters were normal. Normal left ventricular filling pressure. Right Ventricle: The right ventricular size is normal. No increase in right ventricular wall thickness. Right ventricular systolic function is normal. Tricuspid regurgitation signal is inadequate for assessing PA pressure. Left Atrium: Left atrial size was normal in  size. Right Atrium: Right atrial size was normal in size. Pericardium: A small pericardial effusion is present. The pericardial effusion is localized near the right atrium and circumferential. Mitral Valve: The mitral valve is normal in structure. Mild mitral valve regurgitation. No evidence of mitral valve stenosis. Tricuspid Valve: The tricuspid valve is normal in structure. Tricuspid valve regurgitation is trivial. No evidence of tricuspid stenosis. Aortic Valve: The aortic valve is normal in structure. Aortic valve regurgitation is trivial. No aortic stenosis is present. Pulmonic Valve: The pulmonic valve was normal in structure. Pulmonic valve regurgitation is trivial. No evidence of pulmonic stenosis. Aorta: The aortic root is normal in size and structure. Venous: The inferior vena cava is normal in size with greater than 50% respiratory variability, suggesting right atrial pressure of 3 mmHg. IAS/Shunts: No atrial level shunt detected by color flow Doppler.  LEFT VENTRICLE PLAX 2D LVIDd:         5.00 cm  Diastology LVIDs:         3.30 cm  LV e' medial:    8.38 cm/s LV PW:         0.80 cm  LV E/e' medial:  9.8 LV IVS:        0.70 cm  LV e' lateral:   8.05 cm/s LVOT diam:     2.00 cm  LV E/e' lateral: 10.2 LV SV:         43 LV SV Index:   23 LVOT Area:     3.14 cm  RIGHT VENTRICLE RV S prime:     11.20 cm/s TAPSE (M-mode): 1.4 cm LEFT ATRIUM           Index       RIGHT ATRIUM           Index LA diam:      3.40 cm 1.83 cm/m  RA Area:     12.40 cm LA Vol (A2C): 20.4 ml  11.00 ml/m RA Volume:   25.80 ml  13.92 ml/m LA Vol (A4C): 42.9 ml 23.14 ml/m  AORTIC VALVE LVOT Vmax:   96.00 cm/s LVOT Vmean:  68.000 cm/s LVOT VTI:    0.137 m  AORTA Ao Root diam: 3.10 cm MITRAL VALVE MV Area (PHT): 4.36 cm    SHUNTS MV Decel Time: 174 msec    Systemic VTI:  0.14 m MV E velocity: 82.30 cm/s  Systemic Diam: 2.00 cm MV A velocity: 56.10 cm/s MV E/A ratio:  1.47 Fransico Him MD Electronically signed by Fransico Him MD  Signature Date/Time: 02/19/2020/11:52:09 AM    Final    VAS [place] LOWER EXTREMITY VENOUS (DVT)  Result Date: 02/19/2020  Lower Venous DVT Study Indications: Pulmonary embolism, and SOB.  Risk Factors: Cancer Brains Mets. Anticoagulation: Xarelto. Comparison Study: No previous lower ext. Performing Technologist: Vonzell Schlatter RVT  Examination Guidelines: A complete evaluation includes B-mode imaging, spectral Doppler, color Doppler, and power Doppler as needed of all accessible portions of each vessel. Bilateral testing is considered an integral part of a complete examination. Limited examinations for reoccurring indications may be performed as noted. The reflux portion of the exam is performed with the patient in reverse Trendelenburg.  +---------+---------------+---------+-----------+----------+--------------+ RIGHT    CompressibilityPhasicitySpontaneityPropertiesThrombus Aging +---------+---------------+---------+-----------+----------+--------------+ CFV      Full           Yes      Yes                                 +---------+---------------+---------+-----------+----------+--------------+ SFJ      Full                                                        +---------+---------------+---------+-----------+----------+--------------+ FV Prox  Full                                                        +---------+---------------+---------+-----------+----------+--------------+ FV Mid   Full                                                        +---------+---------------+---------+-----------+----------+--------------+ FV DistalFull                                                        +---------+---------------+---------+-----------+----------+--------------+ PFV      Full                                                        +---------+---------------+---------+-----------+----------+--------------+ POP      Full           Yes  Yes                                  +---------+---------------+---------+-----------+----------+--------------+ PTV      Full                                                        +---------+---------------+---------+-----------+----------+--------------+ PERO     Full                                                        +---------+---------------+---------+-----------+----------+--------------+   +---------+---------------+---------+-----------+----------+--------------+ LEFT     CompressibilityPhasicitySpontaneityPropertiesThrombus Aging +---------+---------------+---------+-----------+----------+--------------+ CFV      Full           Yes      Yes                                 +---------+---------------+---------+-----------+----------+--------------+ SFJ      Full                                                        +---------+---------------+---------+-----------+----------+--------------+ FV Prox  Full                                                        +---------+---------------+---------+-----------+----------+--------------+ FV Mid   Full                                                        +---------+---------------+---------+-----------+----------+--------------+ FV DistalFull                                                        +---------+---------------+---------+-----------+----------+--------------+ PFV      Full                                                        +---------+---------------+---------+-----------+----------+--------------+ POP      Full           Yes      Yes                                 +---------+---------------+---------+-----------+----------+--------------+ PTV      None  Acute          +---------+---------------+---------+-----------+----------+--------------+ PERO     None                                         Acute           +---------+---------------+---------+-----------+----------+--------------+  Summary: RIGHT: - There is no evidence of deep vein thrombosis in the lower extremity.  - No cystic structure found in the popliteal fossa.  LEFT: - Findings consistent with acute deep vein thrombosis involving the left posterior tibial veins, and left peroneal veins. - No cystic structure found in the popliteal fossa.  *See table(s) above for measurements and observations. Electronically signed by Deitra Mayo MD on 02/19/2020 at 4:28:15 PM.    Final         Scheduled Meds: . azithromycin  500 mg Oral Daily  . Chlorhexidine Gluconate Cloth  6 each Topical Daily  . clindamycin  1 application Topical BID  . feeding supplement  1 Container Oral TID BM  . fentaNYL  1 patch Transdermal Q72H  . folic acid  1 mg Oral Daily  . linaclotide  145 mcg Oral QAC breakfast  . magnesium oxide  400 mg Oral Daily  . pantoprazole  40 mg Oral Daily  . polyethylene glycol  17 g Oral BID  . Rivaroxaban  15 mg Oral BID WC   Followed by  . [START ON 03/11/2020] rivaroxaban  20 mg Oral Q supper  . sodium chloride flush  10-40 mL Intracatheter Q12H   Continuous Infusions: . cefTRIAXone (ROCEPHIN)  IV 2 g (02/21/20 0902)     LOS: 2 days     Cordelia Poche, MD [facility] 02/21/2020, 9:43 AM  If 7PM-7AM, please contact night-coverage [email or web address]

## 2020-02-21 NOTE — Telephone Encounter (Signed)
Left a detailed voicemail about Nancy Moore's upcoming brain MRI and follow-up visit with Dr. Isidore Moos in February. She was instructed to arrive at [facility] at 11:00 for her port to be accessed and that our office will reach out closer to her appointment to let her know if the visit with Dr. Isidore Moos will be in person or by telephone.   Mont Dutton R.T.(R)(T) Radiation Special Procedures Navigator

## 2020-02-21 NOTE — Progress Notes (Addendum)
Yell for Xarelto Indication: pulmonary embolus  Allergies  Allergen Reactions  . Percocet [Oxycodone-Acetaminophen] Other (See Comments)    "makes me dizzy" / takes TID at home  . Lisinopril Cough  . Losartan Cough    Patient Measurements: Height: 5\' 6"  (167.6 cm) Weight: 75.9 kg (167 lb 5.3 oz) IBW/kg (Calculated) : 59.3 Heparin Dosing Weight:   Vital Signs: Temp: 99.6 F (37.6 C) (01/25 0349) Temp Source: Oral (01/25 0349) BP: 114/71 (01/25 0349) Pulse Rate: 92 (01/25 0349)  Labs: Recent Labs    02/18/20 1527 02/19/20 0500 02/20/20 0330 02/20/20 1530 02/21/20 0318 02/21/20 1101  HGB 7.9* 7.0* 6.9* 8.5*  --  8.8*  HCT 24.7* 22.3* 21.6* 26.2*  --  28.2*  PLT 260 235 238  --   --  302  LABPROT 16.7*  --   --   --   --   --   INR 1.4*  --   --   --   --   --   CREATININE 0.99 0.84  --   --  0.86  --     Estimated Creatinine Clearance: 78.7 mL/min (by C-G formula based on SCr of 0.86 mg/dL).   Medical History: Past Medical History:  Diagnosis Date  . Adenocarcinoma of left lung, stage 4 ([place]) 10/28/2016  . Anemia   . Arthritis   . Constipation   . Crohn's disease ([place])    in remission 35 years +  . Dyspnea    gets "winded"  . GERD (gastroesophageal reflux disease)   . Goals of care, counseling/discussion 10/28/2016  . History of hiatal hernia    noted on CT 12/20  . Hypertension   . Pneumonia    walking pneumonia in the late 80's  . Recurrent pleural effusion on left      Assessment: 55 yo F with NSCLC and new PE.  CTA: single segment RUL PE, no R heart strain.  Baseline INR 1.4, Hg 7.6, PLT 260, SCr 0.99  02/21/20 1:19 PM  - CBC stable s/p transfusion - no overt bleeding reported - SCr stable  Goal of Therapy:  Monitor platelets by anticoagulation protocol: Yes   Plan:  Continue with Xarelto 15 mg po bid with meals x 21 days followed by xarelto 20 mg qsupper to start Sunday 03/11/2020 Patient  educated Will sign off and follow remotely  Thank you for the consult  [place]apoleon Form  02/21/2020 1:18 PM

## 2020-02-22 ENCOUNTER — Other Ambulatory Visit (HOSPITAL_COMMUNITY): Payer: Self-pay | Admitting: Internal Medicine

## 2020-02-22 LAB — BASIC METABOLIC PANEL
Anion gap: 10 (ref 5–15)
BUN: 8 mg/dL (ref 6–20)
CO2: 30 mmol/L (ref 22–32)
Calcium: 9 mg/dL (ref 8.9–10.3)
Chloride: 96 mmol/L — ABNORMAL LOW (ref 98–111)
Creatinine, Ser: 0.91 mg/dL (ref 0.44–1.00)
GFR, Estimated: >60 mL/min (ref >60)
Glucose, Bld: 122 mg/dL — ABNORMAL HIGH (ref 70–99)
Potassium: 3.2 mmol/L — ABNORMAL LOW (ref 3.5–5.1)
Sodium: 136 mmol/L (ref 135–145)

## 2020-02-22 LAB — CBC
HCT: 24.5 % — ABNORMAL LOW (ref 36.0–46.0)
Hemoglobin: 7.8 g/dL — ABNORMAL LOW (ref 12.0–15.0)
MCH: 32.9 pg (ref 26.0–34.0)
MCHC: 31.8 g/dL (ref 30.0–36.0)
MCV: 103.4 fL — ABNORMAL HIGH (ref 80.0–100.0)
Platelets: 301 10*3/uL (ref 150–400)
RBC: 2.37 MIL/uL — ABNORMAL LOW (ref 3.87–5.11)
RDW: 22.8 % — ABNORMAL HIGH (ref 11.5–15.5)
WBC: 11.2 10*3/uL — ABNORMAL HIGH (ref 4.0–10.5)
nRBC: 0.4 % — ABNORMAL HIGH (ref 0.0–0.2)

## 2020-02-22 MED ORDER — POTASSIUM CHLORIDE CRYS ER 20 MEQ PO TBCR
40.0000 meq | EXTENDED_RELEASE_TABLET | ORAL | Status: AC
  Administered 2020-02-22 (×2): 40 meq via ORAL
  Filled 2020-02-22 (×2): qty 2

## 2020-02-22 MED ORDER — RIVAROXABAN (XARELTO) VTE STARTER PACK (15 & 20 MG): ORAL_TABLET | ORAL | 0 refills | Status: AC

## 2020-02-22 MED ORDER — HEPARIN SOD (PORK) LOCK FLUSH 100 UNIT/ML IV SOLN
500.0000 [IU] | INTRAVENOUS | Status: AC | PRN
  Administered 2020-02-22: 500 [IU]
  Filled 2020-02-22: qty 5

## 2020-02-22 MED FILL — XARELTO STARTER PACK: 15 & 20 | 28 days supply | Qty: 51 | Fill #0

## 2020-02-22 NOTE — TOC Transition Note (Signed)
Transition of Care [facility]) - CM/SW Discharge Note   Patient Details  Name: NEFERTARI REBMAN MRN: [id number] Date of Birth: Mar 30, 1965  Transition of Care [facility]) CM/SW Contact:  Dessa Phi, RN Phone Number: 02/22/2020, 12:31 PM   Clinical Narrative: d/c home w/[facility],aide rep Amy aware. No further CM needs.      Final next level of care: [place] Barriers to Discharge: No Barriers Identified   Patient Goals and CMS Choice Patient states their goals for this hospitalization and ongoing recovery are:: go home CMS Medicare.gov Compare Post Acute Care list provided to:: Patient Choice offered to / list presented to : Patient  Discharge Placement                       Discharge Plan and Services   Discharge Planning Services: CM Consult                      HH Arranged: PT,Nurse's Aide [facility] Agency: [facility] Date [place]: 02/20/20 Time [place]: [place] Representative spoke with at [place]: [place] ([place]) Interventions     Readmission Risk Interventions Readmission Risk Prevention Plan 02/20/2020  Transportation Screening Complete  PCP or Specialist Appt within 3-5 Days Complete  HRI or [place] Complete  Social Work Consult for [place] Planning/Counseling Complete  Palliative Care Screening Complete  Medication Review Press photographer) Complete  Some recent data might be hidden

## 2020-02-22 NOTE — Progress Notes (Signed)
Occupational Therapy Treatment Patient Details Name: DELANY STEURY MRN: [id number] DOB: 1965/03/20 Today's Date: 02/22/2020    History of present illness 55 y.o. female with pertinent past medical history of stage IV non-small cell lung cancer  on oral chemotherapy, hypertension, arthritis, anemia that presents to the emergency department today via EMS for fever.   OT comments  Treatment focused on educating patient in regards to energy conservation strategies at home including use of DME and AE if needed. Patient verbalized understanding and provided with hand out to maximize retention of education. Patient found on [facility] when therapist entered the room. Patient min guard for toileting, toilet transfer and return to bed.    Follow Up Recommendations  Supervision/Assistance - 24 hour    Equipment Recommendations  None recommended by OT    Recommendations for Other Services      Precautions / Restrictions Precautions Precautions: Fall Restrictions Weight Bearing Restrictions: No       Mobility Bed Mobility   Bed Mobility: Sit to Supine     Supine to sit: Min guard;HOB elevated        Transfers Overall transfer level: Needs assistance Equipment used: None Transfers: Sit to/from [facility] Sit to Stand: Min guard Stand pivot transfers: Min guard            Balance Overall balance assessment: Mild deficits observed, not formally tested                                         ADL either performed or assessed with clinical judgement   ADL Overall ADL's : Needs assistance/impaired                         Toilet Transfer: Min guard;Ambulation;BSC Toilet Transfer Details (indicate cue type and reason): Patient min guard to transfer from [facility] back to bed. Toileting- Water quality scientist and Hygiene: Min guard;Sit to/from stand Toileting - Clothing Manipulation Details (indicate cue type and reason): Patient seated on [facility]  when therapist entered the room. Patient min guard for toileting.             Vision Patient Visual Report: No change from baseline     Perception     Praxis      Cognition Arousal/Alertness: Awake/alert Behavior During Therapy: WFL for tasks assessed/performed Overall Cognitive Status: Within Functional Limits for tasks assessed                                          Exercises     Shoulder Instructions       General Comments      Pertinent Vitals/ Pain       Pain Assessment: No/denies pain  Home Living                                          Prior Functioning/Environment              Frequency  Min 2X/week        Progress Toward Goals  OT Goals(current goals can now be found in the care plan section)  Progress towards OT goals: Progressing toward goals  Acute Rehab OT  Goals Patient Stated Goal: to get stronger OT Goal Formulation: With patient Time For Goal Achievement: 03/06/20 Potential to Achieve Goals: Good  Plan Discharge plan remains appropriate    Co-evaluation                 AM-PAC OT "6 Clicks" Daily Activity     Outcome Measure   Help from another person eating meals?: None Help from another person taking care of personal grooming?: A Little Help from another person toileting, which includes using toliet, bedpan, or urinal?: A Little Help from another person bathing (including washing, rinsing, drying)?: A Little   Help from another person to put on and taking off regular lower body clothing?: A Little 6 Click Score: 16    End of Session    OT Visit Diagnosis: Muscle weakness (generalized) (M62.81)   Activity Tolerance Patient tolerated treatment well   Patient Left in bed;with call bell/phone within reach   Nurse Communication Mobility status        Time: 6859-9234 OT Time Calculation (min): 13 min  Charges: OT General Charges $OT Visit: 1 Visit OT Treatments $Self  Care/Home Management : 8-22 mins  Derl Barrow, OTR/L [place]  Office [phone] Pager: [place] 02/22/2020, 3:08 PM

## 2020-02-22 NOTE — Progress Notes (Signed)
Patient given discharge, follow up, and medication instructions, verbalized understanding, telemetry monitor removed, IV Team RN de-accessed port, personal belongings with patient, family to transport home

## 2020-02-22 NOTE — Discharge Summary (Signed)
Physician Discharge Summary  Nancy Moore DOB: [birth year] DOA: 02/18/2020  PCP: Darreld Mclean, MD  Admit date: 02/18/2020 Discharge date: 02/22/2020  Admitted From: Home Disposition:  Home with home health services   Recommendations for Outpatient Follow-up:  1. Follow up with PCP in 1 week 2. Please obtain BMP in 1 week to check potassium levels. Hypokalemia was replaced prior to discharge. Please obtain CBC to check leukocytosis in 1 week. Improving and stable on discharge.   Discharge Condition: Stable CODE STATUS: DNR Diet recommendation:  Diet Orders (From admission, onward)    Start     Ordered   02/18/20 1947  Diet regular Room service appropriate? Yes; Fluid consistency: Thin  Diet effective now       Question Answer Comment  Room service appropriate? Yes   Fluid consistency: Thin      02/18/20 1947         Brief/Interim Summary: Nancy Moore is a 55 year old female with past medical history significant for stage IV non-small cell lung cancer with metastasis with recent progression of disease.  She presented to the hospital with a fever of 101.2.  She admitted to shortness of breath, nonproductive cough and chest pain.  She was empirically started on Rocephin, azithromycin for concern for pneumonia.  CTA revealed acute PE.  Patient was started on Xarelto.  Discharge Diagnoses:  Principal Problem:   Acute pulmonary embolism (HCC) Active Problems:   Adenocarcinoma of left lung, stage 4 (HCC)   Primary malignant neoplasm of lung with metastasis to brain (HCC)   Anemia   SIRS (systemic inflammatory response syndrome) (HCC)   Acute PE, left lower extremity DVT in setting of metastatic lung cancer -No evidence of heart strain, normal RV function -Started on Xarelto  SIRS -Thought to be secondary to acute PE.  Patient had a fever but no infectious source.  She was empirically started on Rocephin, azithromycin for possible CAP.  Patient has remained  afebrile.  Fever could be secondary to tumor burden.  Sepsis ruled out  Non-small cell lung cancer with mets -Follow-up with Dr. Earlie Server, Dr. Aniceto Boss  Hypokalemia -Replace  Acute on chronic anemia -Anemia of chronic disease, status post 1 unit packed red blood cell 1/24.  No source of bleeding at this time  Chronic pain, constipation -Continue fentanyl patch, oxycodone, MiraLAX   Discharge Instructions  Discharge Instructions    Call MD for:  difficulty breathing, headache or visual disturbances   Complete by: As directed    Call MD for:  extreme fatigue   Complete by: As directed    Call MD for:  persistant dizziness or light-headedness   Complete by: As directed    Call MD for:  persistant nausea and vomiting   Complete by: As directed    Call MD for:  severe uncontrolled pain   Complete by: As directed    Call MD for:  temperature >100.4   Complete by: As directed    Discharge instructions   Complete by: As directed    You were cared for by a hospitalist during your hospital stay. If you have any questions about your discharge medications or the care you received while you were in the hospital after you are discharged, you can call the unit and ask to speak with the hospitalist on call if the hospitalist that took care of you is not available. Once you are discharged, your primary care physician will handle any further medical issues. Please note that NO  REFILLS for any discharge medications will be authorized once you are discharged, as it is imperative that you return to your primary care physician (or establish a relationship with a primary care physician if you do not have one) for your aftercare needs so that they can reassess your need for medications and monitor your lab values.   Increase activity slowly   Complete by: As directed      Allergies as of 02/22/2020      Reactions   Percocet [oxycodone-acetaminophen] Other (See Comments)   "makes me dizzy" / takes  TID at home   Lisinopril Cough   Losartan Cough      Medication List    TAKE these medications   albuterol 108 (90 Base) MCG/ACT inhaler Commonly known as: VENTOLIN HFA Inhale 2 puffs into the lungs every 6 (six) hours as needed for wheezing or shortness of breath.   clindamycin 1 % gel Commonly known as: CLINDAGEL Apply 1 application topically 2 (two) times daily.   dexamethasone 4 MG tablet Commonly known as: DECADRON 1 tablet p.o. twice daily the day before, day of and day after chemotherapy every 3 weeks   fentaNYL 50 MCG/HR Commonly known as: [name] 1 patch onto the skin every 3 (three) days.   fluticasone 50 MCG/ACT nasal spray Commonly known as: FLONASE Place 2 sprays into both nostrils daily as needed for allergies.   folic acid 1 MG tablet Commonly known as: FOLVITE TAKE 1 TABLET BY MOUTH ONCE A DAY   furosemide 40 MG tablet Commonly known as: LASIX TAKE 1 TABLET BY MOUTH ONCE DAILY   guaiFENesin 600 MG 12 hr tablet Commonly known as: MUCINEX Take 600 mg by mouth 2 (two) times daily as needed for to loosen phlegm.   HYDROcodone-homatropine 5-1.5 MG/5ML syrup Commonly known as: Hycodan Take 5 mLs by mouth every 6 (six) hours as needed for cough.   lidocaine-prilocaine cream Commonly known as: EMLA APPLY TO THE AFFECTED AREA(S) AS NEEDED What changed: See the new instructions.   loperamide 1 MG/5ML solution Commonly known as: IMODIUM Take 10 mLs (2 mg total) by mouth as needed for diarrhea or loose stools. What changed: when to take this   LORazepam 1 MG tablet Commonly known as: ATIVAN Take 1 tablet PO 30 min before radiation therapy and/or MRIs.   magnesium oxide 400 (241.3 Mg) MG tablet Commonly known as: MAG-OX Take 1 tablet (400 mg total) by mouth daily.   methocarbamol 500 MG tablet Commonly known as: ROBAXIN TAKE 1 TABLET (500 MG TOTAL) BY MOUTH EVERY 8 (EIGHT) HOURS AS NEEDED FOR MUSCLE SPASMS.   nystatin cream Commonly known  as: MYCOSTATIN APPLY 1 APPLICATION TOPICALLY 2 (TWO) TIMES DAILY. What changed:   when to take this  reasons to take this   ondansetron 8 MG tablet Commonly known as: ZOFRAN TAKE 1 TABLET BY MOUTH EVERY 8 HOURS AS NEEDED FOR NAUSEA OR VOMITING What changed:   how much to take  how to take this  when to take this  reasons to take this  additional instructions   osimertinib mesylate 80 MG tablet Commonly known as: TAGRISSO Take 80 mg by mouth daily.   oxyCODONE 5 MG immediate release tablet Commonly known as: Oxy IR/ROXICODONE TAKE 1 TABLET BY MOUTH EVERY 6 HOURS AS NEEDED FOR PAIN What changed:   reasons to take this  additional instructions   pantoprazole 40 MG tablet Commonly known as: PROTONIX Take 1 tablet (40 mg total) by mouth daily.  potassium chloride SA 20 MEQ tablet Commonly known as: KLOR-CON Take 1 tablet (20 mEq total) by mouth 2 (two) times daily.   prochlorperazine 10 MG tablet Commonly known as: COMPAZINE Take 1 tablet (10 mg total) by mouth every 6 (six) hours as needed for nausea or vomiting.   Rivaroxaban Stater Pack (15 mg and 20 mg) Commonly known as: XARELTO STARTER PACK Follow package directions: Take one 73m tablet by mouth twice a day. On day 22, switch to one 22mtablet once a day. Take with food.       Follow-up Information    [facility] Follow up.   Specialty: [facility] Contact information: [address] [phone]      [place], JeGay FillerMD. Schedule an appointment as soon as possible for a visit in 1 week(s).   Specialty: Family Medicine Contact information: [phone]      MoCurt BearsMD Follow up.   Specialty: Oncology Contact information: [address] [place]  [phone]            Allergies  Allergen Reactions  .  Percocet [Oxycodone-Acetaminophen] Other (See Comments)    "makes me dizzy" / takes TID at home  . Lisinopril Cough  . Losartan Cough      Procedures/Studies: CT Soft Tissue Neck W Contrast  Result Date: 02/07/2020 CLINICAL DATA:  Metastatic lung cancer EXAM: CT NECK WITH CONTRAST TECHNIQUE: Multidetector CT imaging of the neck was performed using the standard protocol following the bolus administration of intravenous contrast. CONTRAST:  10028mMNIPAQUE IOHEXOL 300 MG/ML  SOLN COMPARISON:  Correlation made with prior CT chest and MRI brain FINDINGS: Motion and streak artifact are present on several slices. Pharynx and larynx: Unremarkable.  No mass or swelling. Salivary glands: Unremarkable. Thyroid: Normal. Lymph nodes: No enlarged lymph nodes. Vascular: Major neck vessels are patent. Limited intracranial: Punctate foci of enhancement in the left cerebellum may correspond to known metastases. Visualized orbits: Not included. Mastoids and visualized paranasal sinuses: Minor mucosal thickening. Visualized mastoid air cells are clear. Skeleton: Mild degenerative changes. No aggressive osseous lesion. An area of lucency within the T2 vertebral body is not definitely present on prior imaging (series 7, image 54). Upper chest: Dictated separately. Other: None. IMPRESSION: No neck mass or adenopathy. Small area of lucency within the T2 vertebral body appears new compared to October chest CT and may reflect a metastatic lesion. Electronically Signed   By: PraMacy MisD.   On: 02/07/2020 16:39   CT Chest W Contrast  Result Date: 02/07/2020 CLINICAL DATA:  Restaging metastatic lung cancer (poorly differentiated adenocarcinoma). EXAM: CT CHEST, ABDOMEN, AND PELVIS WITH CONTRAST TECHNIQUE: Multidetector CT imaging of the chest, abdomen and pelvis was performed following the standard protocol during bolus administration of intravenous contrast. CONTRAST:  100m33mNIPAQUE IOHEXOL 300 MG/ML  SOLN COMPARISON:   Prior CTs 11/16/2019 and 08/22/2019. FINDINGS: CT CHEST FINDINGS Neck findings are dictated separately. Cardiovascular: Right IJ Port-A-Cath extends to the mid right atrial level. No acute vascular findings are seen. There is mild extrinsic compression of the pulmonary arteries centrally by the enlarging central mediastinal process. There is mild aortic atherosclerosis. The heart size is stable. There is a stable small pericardial effusion. Mediastinum/Nodes: There is progressive ill-defined confluent soft tissue in the AP window no suspicious for progressive adenopathy. This measures up to 5.0  x 3.3 cm on image 21/2. Some of this may reflect complex fluid in the superior pericardial recess, although the fluid more inferiorly around the heart appears relatively simple. The thyroid gland, trachea and esophagus demonstrate no significant findings. Lungs/Pleura: Stable trace left pleural effusion. No right pleural effusion or pneumothorax. There are innumerable pulmonary nodules bilaterally with further enlargement. Representative nodules include a right lower lobe nodule measuring 3.1 x 2.6 cm on image 86/7 (previously 1.6 cm maximally) and a 2.2 x 1.4 cm left lower lobe nodule on image 67/7 (previously 1.0 cm maximally). Chronic opacity medially at the left lung base is similar to the prior study, likely due to chronic atelectasis and/or scarring. Patchy peribronchovascular opacities in both lungs are not significantly changed, likely treatment related. Musculoskeletal/Chest wall: There is a new lytic metastasis involving the right 6th rib posteriorly with an associated pathologic fracture (image 40/7). There are new lytic lesions within the spine, most prominent within the T9 vertebral body (image 78/6). This lesion measures up to 1.7 cm on axial image 36/2 and is associated with a small anterior epidural component, not expected to cause cord compression at this time. There are probable additional smaller lesions at  T1 and T2. CT ABDOMEN AND PELVIS FINDINGS Hepatobiliary: New clustered low-density lesions posteriorly in the right hepatic lobe measuring up to 1.2 cm on image 46/2, suspicious for metastatic disease. No evidence of gallstones, gallbladder wall thickening or biliary dilatation. Pancreas: Unremarkable. No pancreatic ductal dilatation or surrounding inflammatory changes. Spleen: Normal in size without focal abnormality. Adrenals/Urinary Tract: Both adrenal glands appear normal. The kidneys appear normal without evidence of urinary tract calculus, suspicious lesion or hydronephrosis. No bladder abnormalities are seen. Stomach/Bowel: The stomach appears unremarkable for its degree of distension. No evidence of bowel wall thickening, distention or surrounding inflammatory change. Moderate stool throughout the colon. Vascular/Lymphatic: There are no enlarged abdominal or pelvic lymph nodes. Mild aortic and branch vessel atherosclerosis without acute vascular findings. Reproductive: Hysterectomy.  No adnexal mass. Other: No ascites or peritoneal nodularity.  Intact abdominal wall. Musculoskeletal: There are few scattered new low-density osseous lesions most notably in the L5 vertebral body, upper sacrum and right iliac bone, suspicious for small metastases. No pathologic fracture. IMPRESSION: 1. Progressive pulmonary metastatic disease. 2. Progressive ill-defined confluent soft tissue in the AP window, suspicious for progressive adenopathy. 3. New lytic lesions within the spine, right 6th rib and right iliac bone, consistent with metastatic disease. There is a small anterior epidural component associated with a lesion in the T9 vertebral body, not expected to cause cord compression at this time. 4. New clustered low-density lesions posteriorly in the right hepatic lobe, suspicious for metastatic disease. 5. Neck findings are dictated separately. 6. Aortic Atherosclerosis (ICD10-I70.0). Electronically Signed   By: Richardean Sale M.D.   On: 02/07/2020 17:08   CT Angio Chest PE W/Cm &/Or Wo Cm  Result Date: 02/18/2020 CLINICAL DATA:  Chest pain, short of breath, fever, lethargy, history of stage IV metastatic lung cancer EXAM: CT ANGIOGRAPHY CHEST WITH CONTRAST TECHNIQUE: Multidetector CT imaging of the chest was performed using the standard protocol during bolus administration of intravenous contrast. Multiplanar CT image reconstructions and MIPs were obtained to evaluate the vascular anatomy. CONTRAST:  122m OMNIPAQUE IOHEXOL 350 MG/ML SOLN COMPARISON:  02/07/2020 FINDINGS: Cardiovascular: This is a technically adequate evaluation of the pulmonary vasculature. There is a segmental right upper lobe pulmonary embolus. Minimal clot burden. No other filling defects. Heart is not enlarged. Small pericardial effusion not  significantly changed since prior study. Stable aneurysmal dilation of the ascending thoracic aorta measuring 3.7 cm. No dissection. Minimal atherosclerosis. Mediastinum/Nodes: Soft tissue mass in the AP window now measures up to 5.3 cm in diameter, previously having measured 5.1 cm, consistent with progressive adenopathy. Increasing mass effect upon the trachea and left mainstem bronchus, though airways remain patent. Thyroid and esophagus are grossly unremarkable. Lungs/Pleura: The diffuse pulmonary metastases of not changed significantly in size or number since the prior exam performed 11 days ago. Small bilateral pleural effusions are noted, less than 500 cc each. Diffuse interstitial prominence throughout the left lung is concerning for lymphangitic spread of disease given the progressive mediastinal adenopathy. No pneumothorax. Upper Abdomen: Metastatic lesion within the right lobe liver measures up to 2 cm on image 101/4, previously measuring 1.7 cm. Musculoskeletal: The metastatic lesion within the right posterior sixth rib with associated pathologic fracture is again noted unchanged. Mottled lucencies  throughout the thoracic spine, most pronounced at T9, consistent with metastatic disease. No new fractures. Review of the MIP images confirms the above findings. IMPRESSION: 1. Single segmental right upper lobe pulmonary embolus. Minimal clot burden. No right heart strain. 2. Diffuse metastatic disease, with innumerable bilateral pulmonary nodules and masses not appreciably changed since recent exam. 3. Slight enlargement of the AP window adenopathy, with mass effect upon the trachea and left mainstem bronchus as above. Diffuse interstitial prominence throughout the left lung concerning for lymphangitic spread of disease. 4. Increased size of right lobe liver metastasis seen previously. 5. Stable bony metastatic disease as above. 6. Stable pericardial effusion. Critical Value/emergent results were called by telephone at the time of interpretation on 02/18/2020 at 6:43 pm to provider PA Cephus Slater, who verbally acknowledged these results. Electronically Signed   By: Randa Ngo M.D.   On: 02/18/2020 18:44   MR Brain W Wo Contrast  Result Date: 02/10/2020 CLINICAL DATA:  Non-small cell lung cancer staging. EXAM: MRI HEAD WITHOUT AND WITH CONTRAST TECHNIQUE: Multiplanar, multiecho pulse sequences of the brain and surrounding structures were obtained without and with intravenous contrast. CONTRAST:  8 mL Gadavist COMPARISON:  11/30/2019 FINDINGS: Brain: The enhancing cerebral and cerebellar lesions on the prior study have all resolved aside from at most a punctate focus of subtle residual enhancement in the superior aspect of the right cerebellar hemisphere (series 24, image 52). New enhancing lesions measure 2 mm in the left frontal lobe (series 24, image 84), 3 mm in the left frontal lobe (series 24, image 104), 3 mm in the right frontal lobe (series 24, image 102), and 2 mm in the right frontal lobe (series 24, image 102). There is no associated edema with any of these lesions. Numerous scattered foci of  chronic microhemorrhage are noted throughout the brain related to previously treated metastases. The ventricles and sulci are normal in size. No acute infarct, midline shift, or extra-axial fluid collection is identified. Vascular: Major intracranial vascular flow voids are preserved. Skull and upper cervical spine: Several new subcentimeter enhancing skull lesions with the largest measuring 1 cm in the midline parietal bone (series 24, image 130). Sinuses/Orbits: Small right maxillary sinus mucous retention cyst. Clear mastoid air cells. Unremarkable orbits. Other: None. IMPRESSION: 1. Four new enhancing cerebral lesions consistent with metastases. No edema. 2. Several new subcentimeter skull metastases. 3. Essentially complete resolution of enhancing metastases on the prior study. Electronically Signed   By: Logan Bores M.D.   On: 02/10/2020 14:44   CT Abdomen Pelvis W Contrast  Result  Date: 02/07/2020 CLINICAL DATA:  Restaging metastatic lung cancer (poorly differentiated adenocarcinoma). EXAM: CT CHEST, ABDOMEN, AND PELVIS WITH CONTRAST TECHNIQUE: Multidetector CT imaging of the chest, abdomen and pelvis was performed following the standard protocol during bolus administration of intravenous contrast. CONTRAST:  153m OMNIPAQUE IOHEXOL 300 MG/ML  SOLN COMPARISON:  Prior CTs 11/16/2019 and 08/22/2019. FINDINGS: CT CHEST FINDINGS Neck findings are dictated separately. Cardiovascular: Right IJ Port-A-Cath extends to the mid right atrial level. No acute vascular findings are seen. There is mild extrinsic compression of the pulmonary arteries centrally by the enlarging central mediastinal process. There is mild aortic atherosclerosis. The heart size is stable. There is a stable small pericardial effusion. Mediastinum/Nodes: There is progressive ill-defined confluent soft tissue in the AP window no suspicious for progressive adenopathy. This measures up to 5.0 x 3.3 cm on image 21/2. Some of this may reflect  complex fluid in the superior pericardial recess, although the fluid more inferiorly around the heart appears relatively simple. The thyroid gland, trachea and esophagus demonstrate no significant findings. Lungs/Pleura: Stable trace left pleural effusion. No right pleural effusion or pneumothorax. There are innumerable pulmonary nodules bilaterally with further enlargement. Representative nodules include a right lower lobe nodule measuring 3.1 x 2.6 cm on image 86/7 (previously 1.6 cm maximally) and a 2.2 x 1.4 cm left lower lobe nodule on image 67/7 (previously 1.0 cm maximally). Chronic opacity medially at the left lung base is similar to the prior study, likely due to chronic atelectasis and/or scarring. Patchy peribronchovascular opacities in both lungs are not significantly changed, likely treatment related. Musculoskeletal/Chest wall: There is a new lytic metastasis involving the right 6th rib posteriorly with an associated pathologic fracture (image 40/7). There are new lytic lesions within the spine, most prominent within the T9 vertebral body (image 78/6). This lesion measures up to 1.7 cm on axial image 36/2 and is associated with a small anterior epidural component, not expected to cause cord compression at this time. There are probable additional smaller lesions at T1 and T2. CT ABDOMEN AND PELVIS FINDINGS Hepatobiliary: New clustered low-density lesions posteriorly in the right hepatic lobe measuring up to 1.2 cm on image 46/2, suspicious for metastatic disease. No evidence of gallstones, gallbladder wall thickening or biliary dilatation. Pancreas: Unremarkable. No pancreatic ductal dilatation or surrounding inflammatory changes. Spleen: Normal in size without focal abnormality. Adrenals/Urinary Tract: Both adrenal glands appear normal. The kidneys appear normal without evidence of urinary tract calculus, suspicious lesion or hydronephrosis. No bladder abnormalities are seen. Stomach/Bowel: The stomach  appears unremarkable for its degree of distension. No evidence of bowel wall thickening, distention or surrounding inflammatory change. Moderate stool throughout the colon. Vascular/Lymphatic: There are no enlarged abdominal or pelvic lymph nodes. Mild aortic and branch vessel atherosclerosis without acute vascular findings. Reproductive: Hysterectomy.  No adnexal mass. Other: No ascites or peritoneal nodularity.  Intact abdominal wall. Musculoskeletal: There are few scattered new low-density osseous lesions most notably in the L5 vertebral body, upper sacrum and right iliac bone, suspicious for small metastases. No pathologic fracture. IMPRESSION: 1. Progressive pulmonary metastatic disease. 2. Progressive ill-defined confluent soft tissue in the AP window, suspicious for progressive adenopathy. 3. New lytic lesions within the spine, right 6th rib and right iliac bone, consistent with metastatic disease. There is a small anterior epidural component associated with a lesion in the T9 vertebral body, not expected to cause cord compression at this time. 4. New clustered low-density lesions posteriorly in the right hepatic lobe, suspicious for metastatic disease. 5.  Neck findings are dictated separately. 6. Aortic Atherosclerosis (ICD10-I70.0). Electronically Signed   By: Richardean Sale M.D.   On: 02/07/2020 17:08   DG Chest Port 1 View  Result Date: 02/18/2020 CLINICAL DATA:  Stage IV lung cancer EXAM: PORTABLE CHEST 1 VIEW COMPARISON:  CT chest dated 02/07/2020 FINDINGS: Patchy/masslike opacity in the left lower lobe with numerous bilateral pulmonary nodules, better evaluated on prior CT. Suspected small left pleural effusion. No pneumothorax. Cardiomegaly. Right chest power port terminates at the cavoatrial junction. IMPRESSION: Patchy/masslike opacity in the left lower lobe, better evaluated on prior CT. Numerous bilateral pulmonary metastases. Suspected small left pleural effusion. Electronically Signed   By:  Julian Hy M.D.   On: 02/18/2020 16:15   ECHOCARDIOGRAM COMPLETE  Result Date: 02/19/2020    ECHOCARDIOGRAM REPORT   Patient Name:   ERYNNE KEALEY Beranek Date of Exam: 02/19/2020 Medical Rec #:  [id number]     Height:       66.0 in Accession #:    [id number]    Weight:       167.3 lb Date of Birth:  11/17/1965      BSA:          1.854 m Patient Age:    67 years      BP:           109/69 mmHg Patient Gender: F             HR:           102 bpm. Exam Location:  Inpatient Procedure: 2D Echo Indications:    Pulmonary Embolus I26.99  History:        Patient has prior history of Echocardiogram examinations, most                 recent 01/10/2017. Lung cancer, Signs/Symptoms:Dyspnea; Risk                 Factors:Hypertension.  Sonographer:    Mikki Santee RDCS (AE) Referring Phys: [place]  1. There is a false tendon in the mid LV cavity of no clinical significance. . Left ventricular ejection fraction, by estimation, is 60 to 65%. The left ventricle has normal function. The left ventricle has no regional wall motion abnormalities. Left ventricular diastolic parameters were normal.  2. Right ventricular systolic function is normal. The right ventricular size is normal. Tricuspid regurgitation signal is inadequate for assessing PA pressure.  3. A small pericardial effusion is present. The pericardial effusion is localized mainly near the right atrium but also circumferential.  4. The mitral valve is normal in structure. Mild mitral valve regurgitation. No evidence of mitral stenosis.  5. The aortic valve is normal in structure. Aortic valve regurgitation is trivial. No aortic stenosis is present.  6. The inferior vena cava is normal in size with greater than 50% respiratory variability, suggesting right atrial pressure of 3 mmHg. FINDINGS  Left Ventricle: There is a false tendon in the mid LV cavity of no clinical significance. Left ventricular ejection fraction, by estimation, is 60 to 65%.  The left ventricle has normal function. The left ventricle has no regional wall motion abnormalities. The left ventricular internal cavity size was normal in size. There is no left ventricular hypertrophy. Left ventricular diastolic parameters were normal. Normal left ventricular filling pressure. Right Ventricle: The right ventricular size is normal. No increase in right ventricular wall thickness. Right ventricular systolic function is normal. Tricuspid regurgitation signal is inadequate for assessing PA pressure.  Left Atrium: Left atrial size was normal in size. Right Atrium: Right atrial size was normal in size. Pericardium: A small pericardial effusion is present. The pericardial effusion is localized near the right atrium and circumferential. Mitral Valve: The mitral valve is normal in structure. Mild mitral valve regurgitation. No evidence of mitral valve stenosis. Tricuspid Valve: The tricuspid valve is normal in structure. Tricuspid valve regurgitation is trivial. No evidence of tricuspid stenosis. Aortic Valve: The aortic valve is normal in structure. Aortic valve regurgitation is trivial. No aortic stenosis is present. Pulmonic Valve: The pulmonic valve was normal in structure. Pulmonic valve regurgitation is trivial. No evidence of pulmonic stenosis. Aorta: The aortic root is normal in size and structure. Venous: The inferior vena cava is normal in size with greater than 50% respiratory variability, suggesting right atrial pressure of 3 mmHg. IAS/Shunts: No atrial level shunt detected by color flow Doppler.  LEFT VENTRICLE PLAX 2D LVIDd:         5.00 cm  Diastology LVIDs:         3.30 cm  LV e' medial:    8.38 cm/s LV PW:         0.80 cm  LV E/e' medial:  9.8 LV IVS:        0.70 cm  LV e' lateral:   8.05 cm/s LVOT diam:     2.00 cm  LV E/e' lateral: 10.2 LV SV:         43 LV SV Index:   23 LVOT Area:     3.14 cm  RIGHT VENTRICLE RV S prime:     11.20 cm/s TAPSE (M-mode): 1.4 cm LEFT ATRIUM            Index       RIGHT ATRIUM           Index LA diam:      3.40 cm 1.83 cm/m  RA Area:     12.40 cm LA Vol (A2C): 20.4 ml 11.00 ml/m RA Volume:   25.80 ml  13.92 ml/m LA Vol (A4C): 42.9 ml 23.14 ml/m  AORTIC VALVE LVOT Vmax:   96.00 cm/s LVOT Vmean:  68.000 cm/s LVOT VTI:    0.137 m  AORTA Ao Root diam: 3.10 cm MITRAL VALVE MV Area (PHT): 4.36 cm    SHUNTS MV Decel Time: 174 msec    Systemic VTI:  0.14 m MV E velocity: 82.30 cm/s  Systemic Diam: 2.00 cm MV A velocity: 56.10 cm/s MV E/A ratio:  1.47 Fransico Him MD Electronically signed by Fransico Him MD Signature Date/Time: 02/19/2020/11:52:09 AM    Final    VAS [place] LOWER EXTREMITY VENOUS (DVT)  Result Date: 02/19/2020  Lower Venous DVT Study Indications: Pulmonary embolism, and SOB.  Risk Factors: Cancer Brains Mets. Anticoagulation: Xarelto. Comparison Study: No previous lower ext. Performing Technologist: Vonzell Schlatter RVT  Examination Guidelines: A complete evaluation includes B-mode imaging, spectral Doppler, color Doppler, and power Doppler as needed of all accessible portions of each vessel. Bilateral testing is considered an integral part of a complete examination. Limited examinations for reoccurring indications may be performed as noted. The reflux portion of the exam is performed with the patient in reverse Trendelenburg.  +---------+---------------+---------+-----------+----------+--------------+ RIGHT    CompressibilityPhasicitySpontaneityPropertiesThrombus Aging +---------+---------------+---------+-----------+----------+--------------+ CFV      Full           Yes      Yes                                 +---------+---------------+---------+-----------+----------+--------------+  SFJ      Full                                                        +---------+---------------+---------+-----------+----------+--------------+ FV Prox  Full                                                         +---------+---------------+---------+-----------+----------+--------------+ FV Mid   Full                                                        +---------+---------------+---------+-----------+----------+--------------+ FV DistalFull                                                        +---------+---------------+---------+-----------+----------+--------------+ PFV      Full                                                        +---------+---------------+---------+-----------+----------+--------------+ POP      Full           Yes      Yes                                 +---------+---------------+---------+-----------+----------+--------------+ PTV      Full                                                        +---------+---------------+---------+-----------+----------+--------------+ PERO     Full                                                        +---------+---------------+---------+-----------+----------+--------------+   +---------+---------------+---------+-----------+----------+--------------+ LEFT     CompressibilityPhasicitySpontaneityPropertiesThrombus Aging +---------+---------------+---------+-----------+----------+--------------+ CFV      Full           Yes      Yes                                 +---------+---------------+---------+-----------+----------+--------------+ SFJ      Full                                                        +---------+---------------+---------+-----------+----------+--------------+  FV Prox  Full                                                        +---------+---------------+---------+-----------+----------+--------------+ FV Mid   Full                                                        +---------+---------------+---------+-----------+----------+--------------+ FV DistalFull                                                         +---------+---------------+---------+-----------+----------+--------------+ PFV      Full                                                        +---------+---------------+---------+-----------+----------+--------------+ POP      Full           Yes      Yes                                 +---------+---------------+---------+-----------+----------+--------------+ PTV      None                                         Acute          +---------+---------------+---------+-----------+----------+--------------+ PERO     None                                         Acute          +---------+---------------+---------+-----------+----------+--------------+  Summary: RIGHT: - There is no evidence of deep vein thrombosis in the lower extremity.  - No cystic structure found in the popliteal fossa.  LEFT: - Findings consistent with acute deep vein thrombosis involving the left posterior tibial veins, and left peroneal veins. - No cystic structure found in the popliteal fossa.  *See table(s) above for measurements and observations. Electronically signed by Deitra Mayo MD on 02/19/2020 at 4:28:15 PM.    Final        Discharge Exam: Vitals:   02/21/20 2008 02/22/20 0439  BP: 119/72 121/77  Pulse: (!) 102 (!) 103  Resp: 18 17  Temp: (!) 97.4 F (36.3 C) 99.1 F (37.3 C)  SpO2: 100% 94%    General: Pt is alert, awake, not in acute distress Cardiovascular: RRR, S1/S2 +, no edema Respiratory: CTA bilaterally, no wheezing, no rhonchi, no respiratory distress, no conversational dyspnea, on room air  Abdominal: Soft, NT, ND, bowel sounds + Extremities: no edema, no cyanosis Psych: Normal mood and affect, stable judgement and insight     The results  of significant diagnostics from this hospitalization (including imaging, microbiology, ancillary and laboratory) are listed below for reference.     Microbiology: Recent Results (from the past 240 hour(s))  Culture, blood  (Routine x 2)     Status: None (Preliminary result)   Collection Time: 02/18/20  3:27 PM   Specimen: BLOOD  Result Value Ref Range Status   Specimen Description   Final    BLOOD PORTA CATH Performed at [facility] [address], [place], [place] [address]    Special Requests   Final    BOTTLES DRAWN AEROBIC AND ANAEROBIC Blood Culture adequate volume Performed at [facility] [address] [place], [place] [address]    Culture   Final    NO GROWTH 4 DAYS Performed at [facility], [place] [address], [place], [place] [address]    Report Status PENDING  Incomplete  Culture, blood (Routine x 2)     Status: None (Preliminary result)   Collection Time: 02/18/20  3:32 PM   Specimen: BLOOD  Result Value Ref Range Status   Specimen Description   Final    BLOOD LEFT ANTECUBITAL Performed at [facility] [address] [place], [place] [address]    Special Requests   Final    BOTTLES DRAWN AEROBIC AND ANAEROBIC Blood Culture results may not be optimal due to an excessive volume of blood received in culture bottles Performed at [facility] [address] [place] [address], [place], [place] [address]    Culture   Final    NO GROWTH 4 DAYS Performed at [facility], [place] [address], [place], [place] [address]    Report Status PENDING  Incomplete  SARS Coronavirus 2 by RT PCR (hospital order, performed in [facility] hospital lab) Nasopharyngeal Nasopharyngeal Swab     Status: None   Collection Time: 02/18/20  3:41 PM   Specimen: Nasopharyngeal Swab  Result Value Ref Range Status   SARS Coronavirus 2 NEGATIVE NEGATIVE Final    Comment: (NOTE) SARS-CoV-2 target nucleic acids are NOT DETECTED.  The SARS-CoV-2 RNA is generally detectable in upper and lower respiratory specimens during the acute phase of infection. The lowest concentration of SARS-CoV-2 viral copies this assay can detect is 250 copies / mL. A  negative result does not preclude SARS-CoV-2 infection and should not be used as the sole basis for treatment or other patient management decisions.  A negative result may occur with improper specimen collection / handling, submission of specimen other than nasopharyngeal swab, presence of viral mutation(s) within the areas targeted by this assay, and inadequate number of viral copies (<250 copies / mL). A negative result must be combined with clinical observations, patient history, and epidemiological information.  Fact Sheet for Patients:   StrictlyIdeas.no  Fact Sheet for Healthcare Providers: BankingDealers.co.za  This test is not yet approved or  cleared by the [place] FDA and has been authorized for detection and/or diagnosis of SARS-CoV-2 by FDA under an Emergency Use Authorization (EUA).  This EUA will remain in effect (meaning this test can be used) for the duration of the COVID-19 declaration under Section 564(b)(1) of the Act, 21 U.S.C. section 360bbb-3(b)(1), unless the authorization is terminated or revoked sooner.  Performed at [facility], [place] [address] [place], [place] [address]      Labs: BNP (last 3 results) No results for input(s): BNP in the last 8760 hours. Basic Metabolic Panel: Recent Labs  Lab  02/18/20 1527 02/18/20 1938 02/19/20 0500 02/21/20 0318 02/22/20 1020  NA 132*  --  134* 137 136  K 2.7*  --  3.5 3.4* 3.2*  CL 93*  --  99 98 96*  CO2 26  --  27 27 30   GLUCOSE 168*  --  111* 96 122*  BUN 9  --  6 7 8   CREATININE 0.99  --  0.84 0.86 0.91  CALCIUM 8.9  --  8.9 8.9 9.0  MG  --  1.6*  --   --   --    Liver Function Tests: Recent Labs  Lab 02/18/20 1527  AST 42*  ALT 20  ALKPHOS 67  BILITOT 0.6  PROT 7.0  ALBUMIN 3.0*   No results for input(s): LIPASE, AMYLASE in the last 168 hours. No results for input(s): AMMONIA in the last 168 hours. CBC: Recent Labs   Lab 02/18/20 1527 02/19/20 0500 02/20/20 0330 02/20/20 1530 02/21/20 1101 02/22/20 0400  WBC 14.7* 13.3* 11.9*  --  12.7* 11.2*  NEUTROABS 11.5*  --   --   --   --   --   HGB 7.9* 7.0* 6.9* 8.5* 8.8* 7.8*  HCT 24.7* 22.3* 21.6* 26.2* 28.2* 24.5*  MCV 104.2* 106.2* 105.9*  --  103.7* 103.4*  PLT 260 235 238  --  302 301   Cardiac Enzymes: No results for input(s): CKTOTAL, CKMB, CKMBINDEX, TROPONINI in the last 168 hours. BNP: Invalid input(s): POCBNP CBG: No results for input(s): GLUCAP in the last 168 hours. D-Dimer No results for input(s): DDIMER in the last 72 hours. Hgb A1c No results for input(s): HGBA1C in the last 72 hours. Lipid Profile No results for input(s): CHOL, HDL, LDLCALC, TRIG, CHOLHDL, LDLDIRECT in the last 72 hours. Thyroid function studies No results for input(s): TSH, T4TOTAL, T3FREE, THYROIDAB in the last 72 hours.  Invalid input(s): FREET3 Anemia work up No results for input(s): VITAMINB12, FOLATE, FERRITIN, TIBC, IRON, RETICCTPCT in the last 72 hours. Urinalysis    Component Value Date/Time   COLORURINE YELLOW 03/15/2019 [place] 03/15/2019 1333   LABSPEC 1.020 03/15/2019 1333   PHURINE 5.5 03/15/2019 1333   GLUCOSEU 100 (A) 03/15/2019 1333   HGBUR NEGATIVE 03/15/2019 1333   BILIRUBINUR MODERATE (A) 03/15/2019 1333   KETONESUR NEGATIVE 03/15/2019 1333   PROTEINUR TRACE (A) 01/10/2020 1400   UROBILINOGEN 1.0 07/22/2010 2017   NITRITE POSITIVE (A) 03/15/2019 1333   LEUKOCYTESUR TRACE (A) 03/15/2019 1333   Sepsis Labs Invalid input(s): PROCALCITONIN,  WBC,  LACTICIDVEN Microbiology Recent Results (from the past 240 hour(s))  Culture, blood (Routine x 2)     Status: None (Preliminary result)   Collection Time: 02/18/20  3:27 PM   Specimen: BLOOD  Result Value Ref Range Status   Specimen Description   Final    BLOOD PORTA CATH Performed at [facility], [place] [address], [place], [place] [address]     Special Requests   Final    BOTTLES DRAWN AEROBIC AND ANAEROBIC Blood Culture adequate volume Performed at [facility] [address], [place], [place] [address]    Culture   Final    NO GROWTH 4 DAYS Performed at [facility], [place] [address], [place], [place] [address]    Report Status PENDING  Incomplete  Culture, blood (Routine x 2)     Status: None (Preliminary result)   Collection Time: 02/18/20  3:32 PM   Specimen: BLOOD  Result Value  Ref Range Status   Specimen Description   Final    BLOOD LEFT ANTECUBITAL Performed at [facility] [address], [place], [place] [address]    Special Requests   Final    BOTTLES DRAWN AEROBIC AND ANAEROBIC Blood Culture results may not be optimal due to an excessive volume of blood received in culture bottles Performed at [facility] [address], [place], [place] [address]    Culture   Final    NO GROWTH 4 DAYS Performed at [facility], [place] [address], [place], [place] [address]    Report Status PENDING  Incomplete  SARS Coronavirus 2 by RT PCR (hospital order, performed in [facility] hospital lab) Nasopharyngeal Nasopharyngeal Swab     Status: None   Collection Time: 02/18/20  3:41 PM   Specimen: Nasopharyngeal Swab  Result Value Ref Range Status   SARS Coronavirus 2 NEGATIVE NEGATIVE Final    Comment: (NOTE) SARS-CoV-2 target nucleic acids are NOT DETECTED.  The SARS-CoV-2 RNA is generally detectable in upper and lower respiratory specimens during the acute phase of infection. The lowest concentration of SARS-CoV-2 viral copies this assay can detect is 250 copies / mL. A negative result does not preclude SARS-CoV-2 infection and should not be used as the sole basis for treatment or other patient management decisions.  A negative result may occur with improper specimen collection / handling, submission of specimen other than nasopharyngeal swab, presence of  viral mutation(s) within the areas targeted by this assay, and inadequate number of viral copies (<250 copies / mL). A negative result must be combined with clinical observations, patient history, and epidemiological information.  Fact Sheet for Patients:   StrictlyIdeas.no  Fact Sheet for Healthcare Providers: BankingDealers.co.za  This test is not yet approved or  cleared by the [place] FDA and has been authorized for detection and/or diagnosis of SARS-CoV-2 by FDA under an Emergency Use Authorization (EUA).  This EUA will remain in effect (meaning this test can be used) for the duration of the COVID-19 declaration under Section 564(b)(1) of the Act, 21 U.S.C. section 360bbb-3(b)(1), unless the authorization is terminated or revoked sooner.  Performed at [facility], [place] [address] [place], [place] [address]      Patient was seen and examined on the day of discharge and was found to be in stable condition. Time coordinating discharge: 35 minutes including assessment and coordination of care, as well as examination of the patient.   SIGNED:  Dessa Phi, DO [facility] 02/22/2020, 12:08 PM

## 2020-02-22 NOTE — Plan of Care (Signed)

## 2020-02-22 NOTE — Plan of Care (Signed)

## 2020-02-23 ENCOUNTER — Telehealth: Payer: Self-pay

## 2020-02-23 LAB — CULTURE, BLOOD (ROUTINE X 2)
Culture: NO GROWTH
Culture: NO GROWTH
Special Requests: ADEQUATE

## 2020-02-23 NOTE — Telephone Encounter (Signed)
Called pt- she needs a bedside commode.  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  Called [facility]   Have put an rx for commode in chart Please fax rx, demographics and discharge summary from last admission to [facility] at [phone]

## 2020-02-23 NOTE — Telephone Encounter (Signed)
During TCM call, patient requested an order for a bedside commode. She thought they mentioned it before she left the hospital but there is no mention of it in the discharge summary.

## 2020-02-23 NOTE — Telephone Encounter (Signed)
Transition Care Management Follow-up Telephone Call  Date of discharge and from where: 02/22/20-[facility]  How have you been since you were released from the hospital? [place] just feeling tired.  Any questions or concerns? Yes-Patient request an order for a bedside commode  Items Reviewed:  Did the pt receive and understand the discharge instructions provided? Yes   Medications obtained and verified? Yes   Other? Yes   Any new allergies since your discharge? No   Dietary orders reviewed? Yes  Do you have support at home? Yes   Home Care and Equipment/Supplies: Were home health services ordered? yes If so, what is the name of the agency? [facility]  Has the agency set up a time to come to the patient's home? yes Were any new equipment or medical supplies ordered?  No What is the name of the medical supply agency? n/a Were you able to get the supplies/equipment? not applicable Do you have any questions related to the use of the equipment or supplies? n/a  Functional Questionnaire: (I = Independent and D = Dependent) ADLs: I with assistance  Bathing/Dressing- I with assistance  Meal Prep- D  Eating- I  Maintaining continence- I with assitance  Transferring/Ambulation- I with walker & assistance  Managing Meds- D  Follow up appointments reviewed:   PCP Hospital f/u appt confirmed? Yes  Scheduled to see Dr. Lorelei Pont on 02/29/20 @ 10:20.  [facility] f/u appt confirmed? No  Awaiting call from Oncology.  Are transportation arrangements needed? No   If their condition worsens, is the pt aware to call PCP or go to the Emergency Dept.? Yes  Was the patient provided with contact information for the PCP's office or ED? Yes  Was to pt encouraged to call back with questions or concerns? Yes

## 2020-02-24 DIAGNOSIS — C7931 Secondary malignant neoplasm of brain: Secondary | ICD-10-CM | POA: Diagnosis not present

## 2020-02-24 DIAGNOSIS — R6510 Systemic inflammatory response syndrome (SIRS) of non-infectious origin without acute organ dysfunction: Secondary | ICD-10-CM | POA: Diagnosis not present

## 2020-02-24 DIAGNOSIS — D649 Anemia, unspecified: Secondary | ICD-10-CM | POA: Diagnosis not present

## 2020-02-24 DIAGNOSIS — M6281 Muscle weakness (generalized): Secondary | ICD-10-CM | POA: Diagnosis not present

## 2020-02-24 DIAGNOSIS — I82402 Acute embolism and thrombosis of unspecified deep veins of left lower extremity: Secondary | ICD-10-CM | POA: Diagnosis not present

## 2020-02-24 DIAGNOSIS — I2699 Other pulmonary embolism without acute cor pulmonale: Secondary | ICD-10-CM | POA: Diagnosis not present

## 2020-02-24 DIAGNOSIS — C3492 Malignant neoplasm of unspecified part of left bronchus or lung: Secondary | ICD-10-CM | POA: Diagnosis not present

## 2020-02-24 DIAGNOSIS — Z7901 Long term (current) use of anticoagulants: Secondary | ICD-10-CM | POA: Diagnosis not present

## 2020-02-24 NOTE — Telephone Encounter (Signed)
All patient info and rx has been faxed.

## 2020-02-25 ENCOUNTER — Other Ambulatory Visit: Payer: Self-pay

## 2020-02-25 ENCOUNTER — Emergency Department (HOSPITAL_COMMUNITY): Payer: Medicare Other

## 2020-02-25 ENCOUNTER — Observation Stay (HOSPITAL_COMMUNITY)
Admission: EM | Admit: 2020-02-25 | Discharge: 2020-02-26 | Disposition: A | Discharge status: Home / Self Care | Payer: Medicare Other | Attending: Internal Medicine | Admitting: Internal Medicine

## 2020-02-25 ENCOUNTER — Encounter (HOSPITAL_COMMUNITY): Payer: Self-pay | Admitting: Emergency Medicine

## 2020-02-25 DIAGNOSIS — Z79899 Other long term (current) drug therapy: Secondary | ICD-10-CM | POA: Diagnosis not present

## 2020-02-25 DIAGNOSIS — D531 Other megaloblastic anemias, not elsewhere classified: Secondary | ICD-10-CM | POA: Insufficient documentation

## 2020-02-25 DIAGNOSIS — R0902 Hypoxemia: Secondary | ICD-10-CM | POA: Diagnosis not present

## 2020-02-25 DIAGNOSIS — I499 Cardiac arrhythmia, unspecified: Secondary | ICD-10-CM | POA: Diagnosis not present

## 2020-02-25 DIAGNOSIS — Z85841 Personal history of malignant neoplasm of brain: Secondary | ICD-10-CM | POA: Insufficient documentation

## 2020-02-25 DIAGNOSIS — Z20822 Contact with and (suspected) exposure to covid-19: Secondary | ICD-10-CM | POA: Insufficient documentation

## 2020-02-25 DIAGNOSIS — I10 Essential (primary) hypertension: Secondary | ICD-10-CM | POA: Diagnosis not present

## 2020-02-25 DIAGNOSIS — C3490 Malignant neoplasm of unspecified part of unspecified bronchus or lung: Secondary | ICD-10-CM | POA: Diagnosis not present

## 2020-02-25 DIAGNOSIS — I2699 Other pulmonary embolism without acute cor pulmonale: Principal | ICD-10-CM | POA: Insufficient documentation

## 2020-02-25 DIAGNOSIS — C799 Secondary malignant neoplasm of unspecified site: Secondary | ICD-10-CM | POA: Diagnosis not present

## 2020-02-25 DIAGNOSIS — I517 Cardiomegaly: Secondary | ICD-10-CM | POA: Diagnosis not present

## 2020-02-25 DIAGNOSIS — Z743 Need for continuous supervision: Secondary | ICD-10-CM | POA: Diagnosis not present

## 2020-02-25 DIAGNOSIS — C801 Malignant (primary) neoplasm, unspecified: Secondary | ICD-10-CM | POA: Insufficient documentation

## 2020-02-25 DIAGNOSIS — R062 Wheezing: Secondary | ICD-10-CM | POA: Diagnosis present

## 2020-02-25 DIAGNOSIS — R911 Solitary pulmonary nodule: Secondary | ICD-10-CM | POA: Diagnosis not present

## 2020-02-25 DIAGNOSIS — R0602 Shortness of breath: Secondary | ICD-10-CM | POA: Diagnosis not present

## 2020-02-25 DIAGNOSIS — C7800 Secondary malignant neoplasm of unspecified lung: Secondary | ICD-10-CM | POA: Diagnosis not present

## 2020-02-25 DIAGNOSIS — Z7901 Long term (current) use of anticoagulants: Secondary | ICD-10-CM | POA: Diagnosis not present

## 2020-02-25 LAB — CBC WITH DIFFERENTIAL/PLATELET
Abs Immature Granulocytes: 0.27 10*3/uL — ABNORMAL HIGH (ref 0.00–0.07)
Basophils Absolute: 0.1 10*3/uL (ref 0.0–0.1)
Basophils Relative: 1 %
Eosinophils Absolute: 0.3 10*3/uL (ref 0.0–0.5)
Eosinophils Relative: 2 %
HCT: 26.7 % — ABNORMAL LOW (ref 36.0–46.0)
Hemoglobin: 8.5 g/dL — ABNORMAL LOW (ref 12.0–15.0)
Immature Granulocytes: 2 %
Lymphocytes Relative: 11 %
Lymphs Abs: 1.6 10*3/uL (ref 0.7–4.0)
MCH: 33.3 pg (ref 26.0–34.0)
MCHC: 31.8 g/dL (ref 30.0–36.0)
MCV: 104.7 fL — ABNORMAL HIGH (ref 80.0–100.0)
Monocytes Absolute: 2.4 10*3/uL — ABNORMAL HIGH (ref 0.1–1.0)
Monocytes Relative: 17 %
Neutro Abs: 9.3 10*3/uL — ABNORMAL HIGH (ref 1.7–7.7)
Neutrophils Relative %: 67 %
Platelets: 474 10*3/uL — ABNORMAL HIGH (ref 150–400)
RBC: 2.55 MIL/uL — ABNORMAL LOW (ref 3.87–5.11)
RDW: 22.6 % — ABNORMAL HIGH (ref 11.5–15.5)
WBC: 13.8 10*3/uL — ABNORMAL HIGH (ref 4.0–10.5)
nRBC: 0.4 % — ABNORMAL HIGH (ref 0.0–0.2)

## 2020-02-25 LAB — BASIC METABOLIC PANEL
Anion gap: 13 (ref 5–15)
BUN: 7 mg/dL (ref 6–20)
CO2: 29 mmol/L (ref 22–32)
Calcium: 9.3 mg/dL (ref 8.9–10.3)
Chloride: 95 mmol/L — ABNORMAL LOW (ref 98–111)
Creatinine, Ser: 0.84 mg/dL (ref 0.44–1.00)
GFR, Estimated: >60 mL/min (ref >60)
Glucose, Bld: 103 mg/dL — ABNORMAL HIGH (ref 70–99)
Potassium: 3.7 mmol/L (ref 3.5–5.1)
Sodium: 137 mmol/L (ref 135–145)

## 2020-02-25 MED ORDER — ONDANSETRON HCL 4 MG PO TABS: 8.0000 mg | ORAL_TABLET | Freq: Three times a day (TID) | ORAL | Status: DC | PRN

## 2020-02-25 MED ORDER — GUAIFENESIN ER 600 MG PO TB12: 600.0000 mg | ORAL_TABLET | Freq: Two times a day (BID) | ORAL | Status: DC | PRN

## 2020-02-25 MED ORDER — FOLIC ACID 1 MG PO TABS
1.0000 mg | ORAL_TABLET | Freq: Every day | ORAL | Status: DC
  Administered 2020-02-26: 1 mg via ORAL
  Filled 2020-02-25: qty 1

## 2020-02-25 MED ORDER — POTASSIUM CHLORIDE CRYS ER 20 MEQ PO TBCR
20.0000 meq | EXTENDED_RELEASE_TABLET | Freq: Two times a day (BID) | ORAL | Status: DC
  Administered 2020-02-25 – 2020-02-26 (×2): 20 meq via ORAL
  Filled 2020-02-25 (×2): qty 1

## 2020-02-25 MED ORDER — OXYCODONE HCL 5 MG PO TABS: 5.0000 mg | ORAL_TABLET | Freq: Four times a day (QID) | ORAL | Status: DC | PRN

## 2020-02-25 MED ORDER — RIVAROXABAN 15 MG PO TABS
15.0000 mg | ORAL_TABLET | Freq: Two times a day (BID) | ORAL | Status: DC
  Administered 2020-02-25 – 2020-02-26 (×2): 15 mg via ORAL
  Filled 2020-02-25 (×5): qty 1

## 2020-02-25 MED ORDER — HYDROCODONE-HOMATROPINE 5-1.5 MG/5ML PO SYRP: 5.0000 mL | ORAL_SOLUTION | Freq: Four times a day (QID) | ORAL | Status: DC | PRN

## 2020-02-25 MED ORDER — CHLORHEXIDINE GLUCONATE CLOTH 2 % EX PADS
6.0000 | MEDICATED_PAD | Freq: Every day | CUTANEOUS | Status: DC
  Administered 2020-02-26: 6 via TOPICAL

## 2020-02-25 MED ORDER — ALBUTEROL SULFATE HFA 108 (90 BASE) MCG/ACT IN AERS: 2.0000 | INHALATION_SPRAY | Freq: Four times a day (QID) | RESPIRATORY_TRACT | Status: DC | PRN

## 2020-02-25 MED ORDER — PROCHLORPERAZINE MALEATE 10 MG PO TABS
10.0000 mg | ORAL_TABLET | Freq: Four times a day (QID) | ORAL | Status: DC | PRN
  Filled 2020-02-25: qty 1

## 2020-02-25 MED ORDER — RIVAROXABAN 20 MG PO TABS: 20.0000 mg | ORAL_TABLET | Freq: Every day | ORAL | Status: DC

## 2020-02-25 MED ORDER — LOPERAMIDE HCL 1 MG/5ML PO LIQD: 2.0000 mg | Freq: Every day | ORAL | Status: DC | PRN

## 2020-02-25 MED ORDER — MAGNESIUM OXIDE 400 (241.3 MG) MG PO TABS
400.0000 mg | ORAL_TABLET | Freq: Every day | ORAL | Status: DC
  Administered 2020-02-26: 400 mg via ORAL
  Filled 2020-02-25: qty 1

## 2020-02-25 MED ORDER — LORAZEPAM 1 MG PO TABS: 1.0000 mg | ORAL_TABLET | Freq: Four times a day (QID) | ORAL | Status: DC | PRN

## 2020-02-25 MED ORDER — FUROSEMIDE 40 MG PO TABS
40.0000 mg | ORAL_TABLET | Freq: Every day | ORAL | Status: DC
  Administered 2020-02-26: 40 mg via ORAL
  Filled 2020-02-25: qty 1

## 2020-02-25 MED ORDER — FLUTICASONE PROPIONATE 50 MCG/ACT NA SUSP: 2.0000 | Freq: Every day | NASAL | Status: DC | PRN

## 2020-02-25 MED ORDER — PANTOPRAZOLE SODIUM 40 MG PO TBEC
40.0000 mg | DELAYED_RELEASE_TABLET | Freq: Every day | ORAL | Status: DC
  Administered 2020-02-26: 40 mg via ORAL
  Filled 2020-02-25: qty 1

## 2020-02-25 MED ORDER — OSIMERTINIB MESYLATE 80 MG PO TABS: 80.0000 mg | ORAL_TABLET | Freq: Every day | ORAL | Status: DC

## 2020-02-25 MED ORDER — METHOCARBAMOL 500 MG PO TABS: 500.0000 mg | ORAL_TABLET | Freq: Three times a day (TID) | ORAL | Status: DC | PRN

## 2020-02-25 MED ORDER — SODIUM CHLORIDE 0.9% FLUSH
10.0000 mL | Freq: Two times a day (BID) | INTRAVENOUS | Status: DC
  Administered 2020-02-26 (×3): 10 mL

## 2020-02-25 NOTE — Social Work (Signed)
CSW following for d/c needs

## 2020-02-25 NOTE — ED Triage Notes (Signed)
[facility] transported pt from home and reports the following:  Pt c/o hypoxia on exertion. Upon EMS arrival, pt 88% while walking to kitchen. EMS applied 3L [place] and O2 increased to 100%. Lung sounds clear.

## 2020-02-25 NOTE — ED Provider Notes (Signed)
[facility] DEPT Provider Note   CSN: [id number] Arrival date & time: 02/25/20  1225     History Complaint: Low O2  Leeona Mccardle Gioffre is a 55 y.o. female.  HPI   Patient has an unfortunate history of metastatic lung CA.  Patient is status post chemotherapy and according to her most recent oncology visit on January 17 patient has evidence of progression of her disease with new metastatic brain lesions.  Plan at that time was for possible second line systemic chemotherapy versus palliative care treatment.  Patient was most recently admitted to the hospital on January 22 and was just discharged on January 26, 3 days ago.  During that hospitalization the patient was treated for an acute PE in the setting of her age for lung cancer.  Patient states she was told she did not need to have home oxygen.  Patient states she was at home today when she measured her oxygen level and it was in the 80s.  She otherwise was feeling okay.  She does not feel like she is having more short of breath than she had.  She is not having chest pain.  She is not having fevers.  She is not coughing.  EMS was called and they noted her oxygen saturation was in the 80s.  Patient was placed on nasal cannula oxygen with improvement of saturation  Past Medical History:  Diagnosis Date  . Adenocarcinoma of left lung, stage 4 ([place]) 10/28/2016  . Anemia   . Arthritis   . Constipation   . Crohn's disease ([place])    in remission 35 years +  . Dyspnea    gets "winded"  . GERD (gastroesophageal reflux disease)   . Goals of care, counseling/discussion 10/28/2016  . History of hiatal hernia    noted on CT 12/20  . Hypertension   . Pneumonia    walking pneumonia in the late 80's  . Recurrent pleural effusion on left     Patient Active Problem List   Diagnosis Date Noted  . Acute pulmonary embolism ([place]) 02/18/2020  . Anemia 02/18/2020  . SIRS (systemic inflammatory response syndrome) ([place])  02/18/2020  . Visit for wound check 06/01/2019  . Infection due to Port-A-Cath 05/06/2019  . Port-A-Cath in place 03/24/2019  . Postoperative visit 03/16/2019  . Encounter for postoperative wound check 10/28/2018  . Pleural effusion 10/13/2018  . Brain metastases ([place]) 05/13/2018  . Primary malignant neoplasm of lung with metastasis to brain ([place]) 02/05/2018  . Cardiac/pericardial tamponade   . Chest tube in place   . Pain   . Acute blood loss anemia   . Palliative care encounter   . AKI (acute kidney injury) ([place]) 01/10/2017  . Hypotension 01/09/2017  . Essential hypertension 01/09/2017  . Hypokalemia 01/09/2017  . Dyspnea 01/09/2017  . Leukocytosis 01/09/2017  . Pleural effusion on right 01/08/2017  . Encounter for antineoplastic chemotherapy 11/20/2016  . Adenocarcinoma of left lung, stage 4 ([place]) 10/28/2016  . Goals of care, counseling/discussion 10/28/2016  . Upper airway cough syndrome 10/16/2016  . Multiple pulmonary nodules 10/15/2016    Past Surgical History:  Procedure Laterality Date  . ABDOMINAL HYSTERECTOMY     partial hysterectomy  . BREAST EXCISIONAL BIOPSY Left 20+ yrs ago   benign  . CHEST TUBE INSERTION Left 10/26/2018   Procedure: INSERTION PLEURAL DRAINAGE CATHETER;  Surgeon: Ivin Poot, MD;  Location: [facility];  Service: Thoracic;  Laterality: Left;  . COLONOSCOPY    . IR  IMAGING GUIDED PORT INSERTION  07/04/2019  . PERICARDIAL WINDOW N/A 01/10/2017   Procedure: PERICARDIAL WINDOW- SUB XYPHOID, RIGHT CHEST TUBE;  Surgeon: Ivin Poot, MD;  Location: [facility];  Service: Thoracic;  Laterality: N/A;  . PORT-A-CATH REMOVAL Left 05/10/2019   Procedure: REMOVAL PORT-A-CATH;  Surgeon: Ivin Poot, MD;  Location: [facility];  Service: Thoracic;  Laterality: Left;  . PORTACATH PLACEMENT Left 02/21/2019   Procedure: INSERTION PORT-A-CATH;  Surgeon: Ivin Poot, MD;  Location: [facility];  Service: Thoracic;  Laterality: Left;  . REMOVAL OF PLEURAL DRAINAGE  CATHETER Left 02/21/2019   Procedure: REMOVAL OF PLEURAL DRAINAGE CATHETER;  Surgeon: Ivin Poot, MD;  Location: [facility];  Service: Thoracic;  Laterality: Left;  Marland Kitchen VIDEO BRONCHOSCOPY Bilateral 10/21/2016   Procedure: VIDEO BRONCHOSCOPY WITH FLUORO;  Surgeon: Tanda Rockers, MD;  Location: [facility];  Service: Cardiopulmonary;  Laterality: Bilateral;     OB History   No obstetric history on file.     Family History  Problem Relation Age of Onset  . Asthma Mother   . Stroke Mother   . Hypertension Mother   . Heart attack Father   . Hypertension Father   . Hyperlipidemia Father   . Dementia Father   . Emphysema Maternal Grandmother   . Hypertension Maternal Grandmother   . Colon cancer Paternal Grandmother   . Brain cancer Maternal Uncle     Social History   Tobacco Use  . Smoking status: Never Smoker  . Smokeless tobacco: Never Used  Vaping Use  . Vaping Use: Never used  Substance Use Topics  . Alcohol use: Not Currently    Comment: socially  . Drug use: No    Home Medications Prior to Admission medications   Medication Sig Start Date End Date Taking? Authorizing Provider  albuterol (VENTOLIN HFA) 108 (90 Base) MCG/ACT inhaler Inhale 2 puffs into the lungs every 6 (six) hours as needed for wheezing or shortness of breath. 02/03/19   Curt Bears, MD  clindamycin (CLINDAGEL) 1 % gel Apply 1 application topically 2 (two) times daily. 12/30/18   Copland, Gay Filler, MD  dexamethasone (DECADRON) 4 MG tablet 1 tablet p.o. twice daily the day before, day of and day after chemotherapy every 3 weeks 10/04/19   Curt Bears, MD  fentaNYL (DURAGESIC) 50 MCG/HR Place 1 patch onto the skin every 3 (three) days. 02/13/20   Curt Bears, MD  fluticasone [facility]) 50 MCG/ACT nasal spray Place 2 sprays into both nostrils daily as needed for allergies. 05/24/19   Copland, Gay Filler, MD  folic acid (FOLVITE) 1 MG tablet TAKE 1 TABLET BY MOUTH ONCE A DAY Patient taking  differently: Take 1 mg by mouth daily. 12/27/19   Curt Bears, MD  furosemide (LASIX) 40 MG tablet TAKE 1 TABLET BY MOUTH ONCE DAILY Patient taking differently: Take 40 mg by mouth daily. 12/27/19   Copland, Gay Filler, MD  guaiFENesin (MUCINEX) 600 MG 12 hr tablet Take 600 mg by mouth 2 (two) times daily as needed for to loosen phlegm.     [provider]  HYDROcodone-homatropine (HYCODAN) 5-1.5 MG/5ML syrup Take 5 mLs by mouth every 6 (six) hours as needed for cough. 02/13/20   Curt Bears, MD  lidocaine-prilocaine (EMLA) cream APPLY TO THE AFFECTED AREA(S) AS NEEDED Patient taking differently: Apply 1 application topically daily as needed (chemo therapy port). 08/24/19   Curt Bears, MD  loperamide (IMODIUM) 1 MG/5ML solution Take 10 mLs (2 mg total) by mouth  as needed for diarrhea or loose stools. Patient taking differently: Take 2 mg by mouth daily as needed for diarrhea or loose stools. 03/24/19   Curt Bears, MD  LORazepam (ATIVAN) 1 MG tablet Take 1 tablet PO 30 min before radiation therapy and/or MRIs. 12/05/19   Eppie Gibson, MD  magnesium oxide (MAG-OX) 400 (241.3 Mg) MG tablet Take 1 tablet (400 mg total) by mouth daily. 01/23/17   Rama, Venetia Maxon, MD  methocarbamol (ROBAXIN) 500 MG tablet TAKE 1 TABLET (500 MG TOTAL) BY MOUTH EVERY 8 (EIGHT) HOURS AS NEEDED FOR MUSCLE SPASMS. 12/02/18   Copland, Gay Filler, MD  nystatin cream (MYCOSTATIN) APPLY 1 APPLICATION TOPICALLY 2 (TWO) TIMES DAILY. Patient taking differently: Apply 1 application topically daily as needed (skin irritaion). 05/05/19   Copland, Gay Filler, MD  ondansetron (ZOFRAN) 8 MG tablet TAKE 1 TABLET BY MOUTH EVERY 8 HOURS AS NEEDED FOR NAUSEA OR VOMITING Patient taking differently: Take 8 mg by mouth every 8 (eight) hours as needed for nausea or vomiting. 04/15/19   Heilingoetter, Cassandra L, PA-C  osimertinib mesylate (TAGRISSO) 80 MG tablet Take 80 mg by mouth daily.    [provider]   oxyCODONE (OXY IR/ROXICODONE) 5 MG immediate release tablet TAKE 1 TABLET BY MOUTH EVERY 6 HOURS AS NEEDED FOR PAIN Patient taking differently: Take 5 mg by mouth every 6 (six) hours as needed for moderate pain. 01/13/20   Heilingoetter, Cassandra L, PA-C  pantoprazole (PROTONIX) 40 MG tablet Take 1 tablet (40 mg total) by mouth daily. 09/14/19   Copland, Gay Filler, MD  potassium chloride SA (KLOR-CON) 20 MEQ tablet Take 1 tablet (20 mEq total) by mouth 2 (two) times daily. 09/14/19   Copland, Gay Filler, MD  prochlorperazine (COMPAZINE) 10 MG tablet Take 1 tablet (10 mg total) by mouth every 6 (six) hours as needed for nausea or vomiting. 12/21/19   Curt Bears, MD  RIVAROXABAN [employer]) VTE STARTER PACK (15 & 20 MG) Follow package directions: Take one 31m tablet by mouth twice a day. On day 22, switch to one 228mtablet once a day. Take with food. 02/22/20   ChDessa PhiDO    Allergies    Percocet [oxycodone-acetaminophen], Lisinopril, and Losartan  Review of Systems   Review of Systems  All other systems reviewed and are negative.   Physical Exam Updated Vital Signs BP 132/82   Pulse (!) 102   Temp 98.3 F (36.8 C) (Oral)   Resp 16   Ht 1.676 m (5' 6" )   Wt 76.7 kg   LMP 03/15/2010   SpO2 96%   BMI 27.28 kg/m   Physical Exam Vitals and nursing note reviewed.  Constitutional:      General: She is not in acute distress.    Appearance: She is well-developed and well-nourished.  HENT:     Head: Normocephalic and atraumatic.     Right Ear: External ear normal.     Left Ear: External ear normal.  Eyes:     General: No scleral icterus.       Right eye: No discharge.        Left eye: No discharge.     Conjunctiva/sclera: Conjunctivae normal.  Neck:     Trachea: No tracheal deviation.  Cardiovascular:     Rate and Rhythm: Normal rate and regular rhythm.     Pulses: Intact distal pulses.  Pulmonary:     Effort: Pulmonary effort is normal. No respiratory distress.      Breath sounds:  Normal breath sounds. No stridor. No wheezing or rales.  Abdominal:     General: Bowel sounds are normal. There is no distension.     Palpations: Abdomen is soft.     Tenderness: There is no abdominal tenderness. There is no guarding or rebound.  Musculoskeletal:        General: No tenderness or edema.     Cervical back: Neck supple.  Skin:    General: Skin is warm and dry.     Findings: No rash.  Neurological:     Mental Status: She is alert.     Cranial Nerves: No cranial nerve deficit (no facial droop, extraocular movements intact, no slurred speech).     Sensory: No sensory deficit.     Motor: No abnormal muscle tone or seizure activity.     Coordination: Coordination normal.     Deep Tendon Reflexes: Strength normal.  Psychiatric:        Mood and Affect: Mood and affect normal.     ED Results / Procedures / Treatments   Labs (all labs ordered are listed, but only abnormal results are displayed) Labs Reviewed  CBC WITH DIFFERENTIAL/PLATELET - Abnormal; Notable for the following components:      Result Value   WBC 13.8 (*)    RBC 2.55 (*)    Hemoglobin 8.5 (*)    HCT 26.7 (*)    MCV 104.7 (*)    RDW 22.6 (*)    Platelets 474 (*)    nRBC 0.4 (*)    Neutro Abs 9.3 (*)    Monocytes Absolute 2.4 (*)    Abs Immature Granulocytes 0.27 (*)    All other components within normal limits  BASIC METABOLIC PANEL - Abnormal; Notable for the following components:   Chloride 95 (*)    Glucose, Bld 103 (*)    All other components within normal limits    EKG None  Radiology DG Chest Portable 1 View  Result Date: 02/25/2020 CLINICAL DATA:  Hypoxia.  Metastatic lung cancer. EXAM: PORTABLE CHEST 1 VIEW COMPARISON:  02/18/2020 FINDINGS: Stable enlarged cardiac silhouette and right jugular porta catheter. No significant change in diffuse prominence of the interstitial markings and multiple bilateral lung nodules. High density material in the stool in the splenic  flexure. No acute bony abnormality. IMPRESSION: 1. Stable cardiomegaly and diffuse interstitial lung disease. 2. Stable multiple bilateral lung nodules compatible with known metastases. Electronically Signed   By: Claudie Revering M.D.   On: 02/25/2020 13:56    Procedures Procedures   Medications Ordered in ED Medications - No data to display  ED Course  I have reviewed the triage vital signs and the nursing notes.  Pertinent labs & imaging results that were available during my care of the patient were reviewed by me and considered in my medical decision making (see chart for details).  Clinical Course as of 02/25/20 1442  Sat Feb 25, 2020  1429 Labs reviewed.  Leukocytosis stable.  Anemia stable [JK]  1610 Metabolic panel normal [JK]  1429 Chest x-ray stable without acute findings [JK]    Clinical Course User Index [JK] Dorie Rank, MD   MDM Rules/Calculators/A&P                          Patient presented to the ED with hypoxia after recent admission for pulmonary embolism as well as metastatic CA.  Patient was ambulate at home and was noted be hypoxic.  In  the ED the patient's more comfortable.  She has minimal oxygen requirement at rest. No new findings on xray.  Pt has been compliant with regimen.  Doubt acute infection or worsening PE.   Suspect oxygen requirement is from her known PE and pulm mets.  Patient would prefer to go home.  I will consult with the medical service to see if it is possible for [place] to arrange for home oxygen but will most likely need to admit until that can be arranged as that will be difficult over the weekend. Final Clinical Impression(s) / ED Diagnoses Final diagnoses:  Metastatic malignant neoplasm, unspecified site [facility])  Pulmonary embolism, other, unspecified chronicity, unspecified whether acute cor pulmonale present [facility])      Dorie Rank, MD 02/26/20 0600

## 2020-02-25 NOTE — H&P (Signed)
History and Physical    [name] KXF:[id number] DOB: 12/09/1965 DOA: 02/25/2020  PCP: Darreld Mclean, MD (Confirm with patient/family/NH records and if not entered, this has to be entered at [facility] point of entry) Patient coming from: Home  I have personally briefly reviewed patient's old medical records in [place]  Chief Complaint: SOB  HPI: Nancy Moore is a 55 y.o. female with medical history significant of recently diagnosed PE on Xarelto, stage IV lung adenocarcinoma, presented with exertional shortness of breath.  Patient was discharged home 3 days ago and has been stable despite she felt shortness of breath when walking around for more than 5 minutes.  Family checked her pulse ox at home and reading has been mid 80s.  She denies any cough no wheezing, no chest pains.  And no fever chills  Family again checked her pulse ox this morning found it was 85% and called EMS.  EMS arrived and found patient O2 saturation 88% on room air.  ED Course: Stable on 2 L, however quickly desatted to 84% on room air when removing oxygen.  Chest x-ray showed similar findings of respirating lung nodules and no new infiltrates.  Case manager in the ED informed for home oxygen evaluation.  Review of Systems: As per HPI otherwise 14 point review of systems negative.    Past Medical History:  Diagnosis Date  . Adenocarcinoma of left lung, stage 4 ([place]) 10/28/2016  . Anemia   . Arthritis   . Constipation   . Crohn's disease ([place])    in remission 35 years +  . Dyspnea    gets "winded"  . GERD (gastroesophageal reflux disease)   . Goals of care, counseling/discussion 10/28/2016  . History of hiatal hernia    noted on CT 12/20  . Hypertension   . Pneumonia    walking pneumonia in the late 80's  . Recurrent pleural effusion on left     Past Surgical History:  Procedure Laterality Date  . ABDOMINAL HYSTERECTOMY     partial hysterectomy  . BREAST EXCISIONAL BIOPSY Left 20+ yrs ago    benign  . CHEST TUBE INSERTION Left 10/26/2018   Procedure: INSERTION PLEURAL DRAINAGE CATHETER;  Surgeon: Ivin Poot, MD;  Location: [place];  Service: Thoracic;  Laterality: Left;  . COLONOSCOPY    . IR IMAGING GUIDED PORT INSERTION  07/04/2019  . PERICARDIAL WINDOW N/A 01/10/2017   Procedure: PERICARDIAL WINDOW- SUB XYPHOID, RIGHT CHEST TUBE;  Surgeon: Ivin Poot, MD;  Location: [place];  Service: Thoracic;  Laterality: N/A;  . PORT-A-CATH REMOVAL Left 05/10/2019   Procedure: REMOVAL PORT-A-CATH;  Surgeon: Ivin Poot, MD;  Location: [place];  Service: Thoracic;  Laterality: Left;  . PORTACATH PLACEMENT Left 02/21/2019   Procedure: INSERTION PORT-A-CATH;  Surgeon: Ivin Poot, MD;  Location: [place];  Service: Thoracic;  Laterality: Left;  . REMOVAL OF PLEURAL DRAINAGE CATHETER Left 02/21/2019   Procedure: REMOVAL OF PLEURAL DRAINAGE CATHETER;  Surgeon: Ivin Poot, MD;  Location: [place];  Service: Thoracic;  Laterality: Left;  Marland Kitchen VIDEO BRONCHOSCOPY Bilateral 10/21/2016   Procedure: VIDEO BRONCHOSCOPY WITH FLUORO;  Surgeon: Tanda Rockers, MD;  Location: [facility];  Service: Cardiopulmonary;  Laterality: Bilateral;     reports that she has never smoked. She has never used smokeless tobacco. She reports previous alcohol use. She reports that she does not use drugs.  Allergies  Allergen Reactions  . Percocet [Oxycodone-Acetaminophen] Other (See Comments)    "  makes me dizzy" / takes TID at home  . Lisinopril Cough  . Losartan Cough    Family History  Problem Relation Age of Onset  . Asthma Mother   . Stroke Mother   . Hypertension Mother   . Heart attack Father   . Hypertension Father   . Hyperlipidemia Father   . Dementia Father   . Emphysema Maternal Grandmother   . Hypertension Maternal Grandmother   . Colon cancer Paternal Grandmother   . Brain cancer Maternal Uncle      Prior to Admission medications   Medication Sig Start Date End Date Taking?  Authorizing Provider  albuterol (VENTOLIN HFA) 108 (90 Base) MCG/ACT inhaler Inhale 2 puffs into the lungs every 6 (six) hours as needed for wheezing or shortness of breath. 02/03/19  Yes Curt Bears, MD  clindamycin (CLINDAGEL) 1 % gel Apply 1 application topically 2 (two) times daily. 12/30/18  Yes Copland, Gay Filler, MD  fentaNYL (DURAGESIC) 50 MCG/HR Place 1 patch onto the skin every 3 (three) days. 02/13/20  Yes Curt Bears, MD  folic acid (FOLVITE) 1 MG tablet TAKE 1 TABLET BY MOUTH ONCE A DAY Patient taking differently: Take 1 mg by mouth daily. 12/27/19  Yes Curt Bears, MD  furosemide (LASIX) 40 MG tablet TAKE 1 TABLET BY MOUTH ONCE DAILY Patient taking differently: Take 40 mg by mouth daily. 12/27/19  Yes Copland, Gay Filler, MD  HYDROcodone-homatropine (HYCODAN) 5-1.5 MG/5ML syrup Take 5 mLs by mouth every 6 (six) hours as needed for cough. 02/13/20  Yes Curt Bears, MD  lidocaine-prilocaine (EMLA) cream APPLY TO THE AFFECTED AREA(S) AS NEEDED Patient taking differently: Apply 1 application topically daily as needed (chemo therapy port). 08/24/19  Yes Curt Bears, MD  magnesium oxide (MAG-OX) 400 (241.3 Mg) MG tablet Take 1 tablet (400 mg total) by mouth daily. 01/23/17  Yes Rama, Venetia Maxon, MD  ondansetron (ZOFRAN) 8 MG tablet TAKE 1 TABLET BY MOUTH EVERY 8 HOURS AS NEEDED FOR NAUSEA OR VOMITING Patient taking differently: Take 8 mg by mouth every 8 (eight) hours as needed for nausea or vomiting. 04/15/19  Yes Heilingoetter, Cassandra L, PA-C  oxyCODONE (OXY IR/ROXICODONE) 5 MG immediate release tablet TAKE 1 TABLET BY MOUTH EVERY 6 HOURS AS NEEDED FOR PAIN Patient taking differently: Take 5 mg by mouth every 6 (six) hours as needed for moderate pain. 01/13/20  Yes Heilingoetter, Cassandra L, PA-C  pantoprazole (PROTONIX) 40 MG tablet Take 1 tablet (40 mg total) by mouth daily. 09/14/19  Yes Copland, Gay Filler, MD  potassium chloride SA (KLOR-CON) 20 MEQ tablet Take 1  tablet (20 mEq total) by mouth 2 (two) times daily. 09/14/19  Yes Copland, Gay Filler, MD  prochlorperazine (COMPAZINE) 10 MG tablet Take 1 tablet (10 mg total) by mouth every 6 (six) hours as needed for nausea or vomiting. 12/21/19  Yes Curt Bears, MD  RIVAROXABAN Alveda Reasons) VTE STARTER PACK (15 & 20 MG) Follow package directions: Take one 15mg  tablet by mouth twice a day. On day 22, switch to one 20mg  tablet once a day. Take with food. Patient taking differently: Take 15 mg by mouth 2 (two) times daily. Follow package directions: Take one 15mg  tablet by mouth twice a day. On day 22, switch to one 20mg  tablet once a day. Take with food. 02/22/20  Yes Dessa Phi, DO    Physical Exam: Vitals:   02/25/20 1345 02/25/20 1400 02/25/20 1415 02/25/20 1430  BP: 132/82 133/84 130/83 129/88  Pulse: (!) 102 Marland Kitchen)  102 (!) 115 (!) 102  Resp: 16 15 18 19   Temp:      TempSrc:      SpO2: 96% 99% (!) 84% 96%  Weight:      Height:        Constitutional: NAD, calm, comfortable Vitals:   02/25/20 1345 02/25/20 1400 02/25/20 1415 02/25/20 1430  BP: 132/82 133/84 130/83 129/88  Pulse: (!) 102 (!) 102 (!) 115 (!) 102  Resp: 16 15 18 19   Temp:      TempSrc:      SpO2: 96% 99% (!) 84% 96%  Weight:      Height:       Eyes: PERRL, lids and conjunctivae normal ENMT: Mucous membranes are moist. Posterior pharynx clear of any exudate or lesions.Normal dentition.  Neck: normal, supple, no masses, no thyromegaly Respiratory: clear to auscultation bilaterally, no wheezing, no crackles. Normal respiratory effort. No accessory muscle use.  Cardiovascular: Regular rate and rhythm, no murmurs / rubs / gallops. No extremity edema. 2+ pedal pulses. No carotid bruits.  Abdomen: no tenderness, no masses palpated. No hepatosplenomegaly. Bowel sounds positive.  Musculoskeletal: no clubbing / cyanosis. No joint deformity upper and lower extremities. Good ROM, no contractures. Normal muscle tone.  Skin: no rashes,  lesions, ulcers. No induration Neurologic: CN 2-12 grossly intact. Sensation intact, DTR normal. Strength 5/5 in all 4.  Psychiatric: Normal judgment and insight. Alert and oriented x 3. Normal mood.     Labs on Admission: I have personally reviewed following labs and imaging studies  CBC: Recent Labs  Lab 02/19/20 0500 02/20/20 0330 02/20/20 1530 02/21/20 1101 02/22/20 0400 02/25/20 1314  WBC 13.3* 11.9*  --  12.7* 11.2* 13.8*  NEUTROABS  --   --   --   --   --  9.3*  HGB 7.0* 6.9* 8.5* 8.8* 7.8* 8.5*  HCT 22.3* 21.6* 26.2* 28.2* 24.5* 26.7*  MCV 106.2* 105.9*  --  103.7* 103.4* 104.7*  PLT 235 238  --  302 301 462*   Basic Metabolic Panel: Recent Labs  Lab 02/18/20 1938 02/19/20 0500 02/21/20 0318 02/22/20 1020 02/25/20 1314  NA  --  134* 137 136 137  K  --  3.5 3.4* 3.2* 3.7  CL  --  99 98 96* 95*  CO2  --  27 27 30 29   GLUCOSE  --  111* 96 122* 103*  BUN  --  6 7 8 7   CREATININE  --  0.84 0.86 0.91 0.84  CALCIUM  --  8.9 8.9 9.0 9.3  MG 1.6*  --   --   --   --    GFR: Estimated Creatinine Clearance: 81.1 mL/min (by C-G formula based on SCr of 0.84 mg/dL). Liver Function Tests: No results for input(s): AST, ALT, ALKPHOS, BILITOT, PROT, ALBUMIN in the last 168 hours. No results for input(s): LIPASE, AMYLASE in the last 168 hours. No results for input(s): AMMONIA in the last 168 hours. Coagulation Profile: No results for input(s): INR, PROTIME in the last 168 hours. Cardiac Enzymes: No results for input(s): CKTOTAL, CKMB, CKMBINDEX, TROPONINI in the last 168 hours. BNP (last 3 results) No results for input(s): PROBNP in the last 8760 hours. HbA1C: No results for input(s): HGBA1C in the last 72 hours. CBG: No results for input(s): GLUCAP in the last 168 hours. Lipid Profile: No results for input(s): CHOL, HDL, LDLCALC, TRIG, CHOLHDL, LDLDIRECT in the last 72 hours. Thyroid Function Tests: No results for input(s): TSH, T4TOTAL, FREET4, T3FREE,  THYROIDAB in  the last 72 hours. Anemia Panel: No results for input(s): VITAMINB12, FOLATE, FERRITIN, TIBC, IRON, RETICCTPCT in the last 72 hours. Urine analysis:    Component Value Date/Time   COLORURINE YELLOW 03/15/2019 [place] 03/15/2019 1333   LABSPEC 1.020 03/15/2019 1333   PHURINE 5.5 03/15/2019 1333   GLUCOSEU 100 (A) 03/15/2019 1333   HGBUR NEGATIVE 03/15/2019 1333   BILIRUBINUR MODERATE (A) 03/15/2019 1333   KETONESUR NEGATIVE 03/15/2019 1333   PROTEINUR TRACE (A) 01/10/2020 1400   UROBILINOGEN 1.0 07/22/2010 2017   NITRITE POSITIVE (A) 03/15/2019 1333   LEUKOCYTESUR TRACE (A) 03/15/2019 1333    Radiological Exams on Admission: DG Chest Portable 1 View  Result Date: 02/25/2020 CLINICAL DATA:  Hypoxia.  Metastatic lung cancer. EXAM: PORTABLE CHEST 1 VIEW COMPARISON:  02/18/2020 FINDINGS: Stable enlarged cardiac silhouette and right jugular porta catheter. No significant change in diffuse prominence of the interstitial markings and multiple bilateral lung nodules. High density material in the stool in the splenic flexure. No acute bony abnormality. IMPRESSION: 1. Stable cardiomegaly and diffuse interstitial lung disease. 2. Stable multiple bilateral lung nodules compatible with known metastases. Electronically Signed   By: Claudie Revering M.D.   On: 02/25/2020 13:56    EKG: None at this time Assessment/Plan Active Problems:   Hypoxia  (please populate well all problems here in Problem List. (For example, if patient is on BP meds at home and you resume or decide to hold them, it is a problem that needs to be her. Same for CAD, COPD, HLD and so on)  Acute hypoxic respite failure -Likely from chronic combined effect from PE and widespread lung cancer -Discussed with case manager in the ED, who will be working on setting up home O2. -MedSurg observation, likely can be discharged home within 24 hours after setting up home O2.  PE -Continue Xarelto, still on loading  dose  Chronic macrocytic anemia -Check iron study -Outpatient EPO  Stage IV lung cancer -Continue chemotherapy, outpatient oncology follow-up  Peripheral edema -Normal echo, continue as needed Lasix  DVT prophylaxis: Xarelto  code Status: DNR Family Communication: Sister at bedside Disposition Plan: Expect less than 2 midnight hospital stay, once home O2 therapy patient can be discharged home Consults called: None Admission status: MedSurg observation   Lequita Halt MD [facility] Pager [phone]  02/25/2020, 4:01 PM

## 2020-02-26 DIAGNOSIS — I2699 Other pulmonary embolism without acute cor pulmonale: Secondary | ICD-10-CM | POA: Diagnosis not present

## 2020-02-26 DIAGNOSIS — J9601 Acute respiratory failure with hypoxia: Secondary | ICD-10-CM | POA: Diagnosis not present

## 2020-02-26 DIAGNOSIS — C799 Secondary malignant neoplasm of unspecified site: Secondary | ICD-10-CM | POA: Diagnosis not present

## 2020-02-26 LAB — FERRITIN: Ferritin: 683 ng/mL — ABNORMAL HIGH (ref 11–307)

## 2020-02-26 LAB — IRON AND TIBC
Iron: 13 ug/dL — ABNORMAL LOW (ref 28–170)
Saturation Ratios: 9 % — ABNORMAL LOW (ref 10.4–31.8)
TIBC: 140 ug/dL — ABNORMAL LOW (ref 250–450)
UIBC: 127 ug/dL

## 2020-02-26 LAB — SARS CORONAVIRUS 2 (TAT 6-24 HRS): SARS Coronavirus 2: NEGATIVE

## 2020-02-26 MED ORDER — HEPARIN SOD (PORK) LOCK FLUSH 100 UNIT/ML IV SOLN
500.0000 [IU] | INTRAVENOUS | Status: AC | PRN
  Administered 2020-02-26: 500 [IU]
  Filled 2020-02-26: qty 5

## 2020-02-26 NOTE — Discharge Summary (Signed)
Physician Discharge Summary  Nancy Moore WSF:[id number] DOB: September 12, 1965 DOA: 02/25/2020  PCP: Darreld Mclean, MD  Admit date: 02/25/2020 Discharge date: 02/26/2020  Admitted From:home Disposition: home Recommendations for Outpatient Follow-up:  1. Follow up with PCP in 1-2 weeks 2. Please obtain BMP/CBC in one week  Home Health: no Equipment/Devices:o2 at 2 L  Discharge Condition:stable CODE STATUS:dnr Diet recommendation:cardiac Brief/Interim Summary: Nancy Moore is a 55 y.o. female with medical history significant of recently diagnosed PE on Xarelto, stage IV lung adenocarcinoma, presented with exertional shortness of breath.  Patient was discharged home 3 days ago and has been stable despite she felt shortness of breath when walking around for more than 5 minutes.  Family checked her pulse ox at home and reading has been mid 80s.  She denies any cough no wheezing, no chest pains.  And no fever chills  Family again checked her pulse ox this morning found it was 85% and called EMS.  EMS arrived and found patient O2 saturation 88% on room air.  ED Course: Stable on 2 L, however quickly desatted to 84% on room air when removing oxygen.  Chest x-ray showed similar findings of respirating lung nodules and no new infiltrates.  Case manager in the ED informed for home oxygen evaluation.  Discharge Diagnoses:  Active Problems:   Pulmonary embolism (HCC)   Hypoxia   #1 acute hypoxic respiratory failure her saturation was 84% on room air on admission.  She was recently diagnosed with PE and she has widespread lung CA.  She remained stable on 2 L during the hospital stay with her sats above 92%.  She will be discharged home today on 2 L of oxygen indefinitely.  #2 acute PE continue Xarelto.  #3 stage IV lung CA follow-up with oncologist.  #4 chronic macrocytic anemia continue outpatient Epogen.  Very sleepy  Estimated body mass index is 27.28 kg/m as calculated from the  following:   Height as of this encounter: 5' 6"  (1.676 m).   Weight as of this encounter: 76.7 kg.  Discharge Instructions  Discharge Instructions    Diet - low sodium heart healthy   Complete by: As directed    Increase activity slowly   Complete by: As directed      Allergies as of 02/26/2020      Reactions   Percocet [oxycodone-acetaminophen] Other (See Comments)   "makes me dizzy" / takes TID at home   Lisinopril Cough   Losartan Cough      Medication List    TAKE these medications   albuterol 108 (90 Base) MCG/ACT inhaler Commonly known as: VENTOLIN HFA Inhale 2 puffs into the lungs every 6 (six) hours as needed for wheezing or shortness of breath.   clindamycin 1 % gel Commonly known as: CLINDAGEL Apply 1 application topically 2 (two) times daily.   fentaNYL 50 MCG/HR Commonly known as: [place] 1 patch onto the skin every 3 (three) days.   folic acid 1 MG tablet Commonly known as: FOLVITE TAKE 1 TABLET BY MOUTH ONCE A DAY   furosemide 40 MG tablet Commonly known as: LASIX TAKE 1 TABLET BY MOUTH ONCE DAILY   HYDROcodone-homatropine 5-1.5 MG/5ML syrup Commonly known as: Hycodan Take 5 mLs by mouth every 6 (six) hours as needed for cough.   lidocaine-prilocaine cream Commonly known as: EMLA APPLY TO THE AFFECTED AREA(S) AS NEEDED What changed: See the new instructions.   magnesium oxide 400 (241.3 Mg) MG tablet Commonly known as: MAG-OX  Take 1 tablet (400 mg total) by mouth daily.   ondansetron 8 MG tablet Commonly known as: ZOFRAN TAKE 1 TABLET BY MOUTH EVERY 8 HOURS AS NEEDED FOR NAUSEA OR VOMITING What changed:   how much to take  how to take this  when to take this  reasons to take this  additional instructions   oxyCODONE 5 MG immediate release tablet Commonly known as: Oxy IR/ROXICODONE TAKE 1 TABLET BY MOUTH EVERY 6 HOURS AS NEEDED FOR PAIN What changed:   reasons to take this  additional instructions   pantoprazole 40  MG tablet Commonly known as: PROTONIX Take 1 tablet (40 mg total) by mouth daily.   potassium chloride SA 20 MEQ tablet Commonly known as: KLOR-CON Take 1 tablet (20 mEq total) by mouth 2 (two) times daily.   prochlorperazine 10 MG tablet Commonly known as: COMPAZINE Take 1 tablet (10 mg total) by mouth every 6 (six) hours as needed for nausea or vomiting.   Rivaroxaban Stater Pack (15 mg and 20 mg) Commonly known as: XARELTO STARTER PACK Follow package directions: Take one 58m tablet by mouth twice a day. On day 22, switch to one 275mtablet once a day. Take with food. What changed:   how much to take  how to take this  when to take this            Durable Medical Equipment  (From admission, onward)         Start     Ordered   02/26/20 0946  DME Oxygen  Once       Question Answer Comment  Length of Need Lifetime   Mode or (Route) Nasal cannula   Liters per Minute 2   Frequency Continuous (stationary and portable oxygen unit needed)   Oxygen conserving device Yes   Oxygen delivery system Gas      02/26/20 0946          Allergies  Allergen Reactions  . Percocet [Oxycodone-Acetaminophen] Other (See Comments)    "makes me dizzy" / takes TID at home  . Lisinopril Cough  . Losartan Cough    Consultations: None  Procedures/Studies: CT Soft Tissue Neck W Contrast  Result Date: 02/07/2020 CLINICAL DATA:  Metastatic lung cancer EXAM: CT NECK WITH CONTRAST TECHNIQUE: Multidetector CT imaging of the neck was performed using the standard protocol following the bolus administration of intravenous contrast. CONTRAST:  1004mMNIPAQUE IOHEXOL 300 MG/ML  SOLN COMPARISON:  Correlation made with prior CT chest and MRI brain FINDINGS: Motion and streak artifact are present on several slices. Pharynx and larynx: Unremarkable.  No mass or swelling. Salivary glands: Unremarkable. Thyroid: Normal. Lymph nodes: No enlarged lymph nodes. Vascular: Major neck vessels are patent.  Limited intracranial: Punctate foci of enhancement in the left cerebellum may correspond to known metastases. Visualized orbits: Not included. Mastoids and visualized paranasal sinuses: Minor mucosal thickening. Visualized mastoid air cells are clear. Skeleton: Mild degenerative changes. No aggressive osseous lesion. An area of lucency within the T2 vertebral body is not definitely present on prior imaging (series 7, image 54). Upper chest: Dictated separately. Other: None. IMPRESSION: No neck mass or adenopathy. Small area of lucency within the T2 vertebral body appears new compared to October chest CT and may reflect a metastatic lesion. Electronically Signed   By: PraMacy MisD.   On: 02/07/2020 16:39   CT Chest W Contrast  Result Date: 02/07/2020 CLINICAL DATA:  Restaging metastatic lung cancer (poorly differentiated adenocarcinoma). EXAM: CT CHEST,  ABDOMEN, AND PELVIS WITH CONTRAST TECHNIQUE: Multidetector CT imaging of the chest, abdomen and pelvis was performed following the standard protocol during bolus administration of intravenous contrast. CONTRAST:  163m OMNIPAQUE IOHEXOL 300 MG/ML  SOLN COMPARISON:  Prior CTs 11/16/2019 and 08/22/2019. FINDINGS: CT CHEST FINDINGS Neck findings are dictated separately. Cardiovascular: Right IJ Port-A-Cath extends to the mid right atrial level. No acute vascular findings are seen. There is mild extrinsic compression of the pulmonary arteries centrally by the enlarging central mediastinal process. There is mild aortic atherosclerosis. The heart size is stable. There is a stable small pericardial effusion. Mediastinum/Nodes: There is progressive ill-defined confluent soft tissue in the AP window no suspicious for progressive adenopathy. This measures up to 5.0 x 3.3 cm on image 21/2. Some of this may reflect complex fluid in the superior pericardial recess, although the fluid more inferiorly around the heart appears relatively simple. The thyroid gland, trachea  and esophagus demonstrate no significant findings. Lungs/Pleura: Stable trace left pleural effusion. No right pleural effusion or pneumothorax. There are innumerable pulmonary nodules bilaterally with further enlargement. Representative nodules include a right lower lobe nodule measuring 3.1 x 2.6 cm on image 86/7 (previously 1.6 cm maximally) and a 2.2 x 1.4 cm left lower lobe nodule on image 67/7 (previously 1.0 cm maximally). Chronic opacity medially at the left lung base is similar to the prior study, likely due to chronic atelectasis and/or scarring. Patchy peribronchovascular opacities in both lungs are not significantly changed, likely treatment related. Musculoskeletal/Chest wall: There is a new lytic metastasis involving the right 6th rib posteriorly with an associated pathologic fracture (image 40/7). There are new lytic lesions within the spine, most prominent within the T9 vertebral body (image 78/6). This lesion measures up to 1.7 cm on axial image 36/2 and is associated with a small anterior epidural component, not expected to cause cord compression at this time. There are probable additional smaller lesions at T1 and T2. CT ABDOMEN AND PELVIS FINDINGS Hepatobiliary: New clustered low-density lesions posteriorly in the right hepatic lobe measuring up to 1.2 cm on image 46/2, suspicious for metastatic disease. No evidence of gallstones, gallbladder wall thickening or biliary dilatation. Pancreas: Unremarkable. No pancreatic ductal dilatation or surrounding inflammatory changes. Spleen: Normal in size without focal abnormality. Adrenals/Urinary Tract: Both adrenal glands appear normal. The kidneys appear normal without evidence of urinary tract calculus, suspicious lesion or hydronephrosis. No bladder abnormalities are seen. Stomach/Bowel: The stomach appears unremarkable for its degree of distension. No evidence of bowel wall thickening, distention or surrounding inflammatory change. Moderate stool  throughout the colon. Vascular/Lymphatic: There are no enlarged abdominal or pelvic lymph nodes. Mild aortic and branch vessel atherosclerosis without acute vascular findings. Reproductive: Hysterectomy.  No adnexal mass. Other: No ascites or peritoneal nodularity.  Intact abdominal wall. Musculoskeletal: There are few scattered new low-density osseous lesions most notably in the L5 vertebral body, upper sacrum and right iliac bone, suspicious for small metastases. No pathologic fracture. IMPRESSION: 1. Progressive pulmonary metastatic disease. 2. Progressive ill-defined confluent soft tissue in the AP window, suspicious for progressive adenopathy. 3. New lytic lesions within the spine, right 6th rib and right iliac bone, consistent with metastatic disease. There is a small anterior epidural component associated with a lesion in the T9 vertebral body, not expected to cause cord compression at this time. 4. New clustered low-density lesions posteriorly in the right hepatic lobe, suspicious for metastatic disease. 5. Neck findings are dictated separately. 6. Aortic Atherosclerosis (ICD10-I70.0). Electronically Signed   By: WGwyndolyn Saxon  Lin Landsman M.D.   On: 02/07/2020 17:08   CT Angio Chest PE W/Cm &/Or Wo Cm  Result Date: 02/18/2020 CLINICAL DATA:  Chest pain, short of breath, fever, lethargy, history of stage IV metastatic lung cancer EXAM: CT ANGIOGRAPHY CHEST WITH CONTRAST TECHNIQUE: Multidetector CT imaging of the chest was performed using the standard protocol during bolus administration of intravenous contrast. Multiplanar CT image reconstructions and MIPs were obtained to evaluate the vascular anatomy. CONTRAST:  166m OMNIPAQUE IOHEXOL 350 MG/ML SOLN COMPARISON:  02/07/2020 FINDINGS: Cardiovascular: This is a technically adequate evaluation of the pulmonary vasculature. There is a segmental right upper lobe pulmonary embolus. Minimal clot burden. No other filling defects. Heart is not enlarged. Small pericardial  effusion not significantly changed since prior study. Stable aneurysmal dilation of the ascending thoracic aorta measuring 3.7 cm. No dissection. Minimal atherosclerosis. Mediastinum/Nodes: Soft tissue mass in the AP window now measures up to 5.3 cm in diameter, previously having measured 5.1 cm, consistent with progressive adenopathy. Increasing mass effect upon the trachea and left mainstem bronchus, though airways remain patent. Thyroid and esophagus are grossly unremarkable. Lungs/Pleura: The diffuse pulmonary metastases of not changed significantly in size or number since the prior exam performed 11 days ago. Small bilateral pleural effusions are noted, less than 500 cc each. Diffuse interstitial prominence throughout the left lung is concerning for lymphangitic spread of disease given the progressive mediastinal adenopathy. No pneumothorax. Upper Abdomen: Metastatic lesion within the right lobe liver measures up to 2 cm on image 101/4, previously measuring 1.7 cm. Musculoskeletal: The metastatic lesion within the right posterior sixth rib with associated pathologic fracture is again noted unchanged. Mottled lucencies throughout the thoracic spine, most pronounced at T9, consistent with metastatic disease. No new fractures. Review of the MIP images confirms the above findings. IMPRESSION: 1. Single segmental right upper lobe pulmonary embolus. Minimal clot burden. No right heart strain. 2. Diffuse metastatic disease, with innumerable bilateral pulmonary nodules and masses not appreciably changed since recent exam. 3. Slight enlargement of the AP window adenopathy, with mass effect upon the trachea and left mainstem bronchus as above. Diffuse interstitial prominence throughout the left lung concerning for lymphangitic spread of disease. 4. Increased size of right lobe liver metastasis seen previously. 5. Stable bony metastatic disease as above. 6. Stable pericardial effusion. Critical Value/emergent results were  called by telephone at the time of interpretation on 02/18/2020 at 6:43 pm to provider PA JCephus Slater who verbally acknowledged these results. Electronically Signed   By: MRanda NgoM.D.   On: 02/18/2020 18:44   MR Brain W Wo Contrast  Result Date: 02/10/2020 CLINICAL DATA:  Non-small cell lung cancer staging. EXAM: MRI HEAD WITHOUT AND WITH CONTRAST TECHNIQUE: Multiplanar, multiecho pulse sequences of the brain and surrounding structures were obtained without and with intravenous contrast. CONTRAST:  8 mL Gadavist COMPARISON:  11/30/2019 FINDINGS: Brain: The enhancing cerebral and cerebellar lesions on the prior study have all resolved aside from at most a punctate focus of subtle residual enhancement in the superior aspect of the right cerebellar hemisphere (series 24, image 52). New enhancing lesions measure 2 mm in the left frontal lobe (series 24, image 84), 3 mm in the left frontal lobe (series 24, image 104), 3 mm in the right frontal lobe (series 24, image 102), and 2 mm in the right frontal lobe (series 24, image 102). There is no associated edema with any of these lesions. Numerous scattered foci of chronic microhemorrhage are noted throughout the brain related to previously  treated metastases. The ventricles and sulci are normal in size. No acute infarct, midline shift, or extra-axial fluid collection is identified. Vascular: Major intracranial vascular flow voids are preserved. Skull and upper cervical spine: Several new subcentimeter enhancing skull lesions with the largest measuring 1 cm in the midline parietal bone (series 24, image 130). Sinuses/Orbits: Small right maxillary sinus mucous retention cyst. Clear mastoid air cells. Unremarkable orbits. Other: None. IMPRESSION: 1. Four new enhancing cerebral lesions consistent with metastases. No edema. 2. Several new subcentimeter skull metastases. 3. Essentially complete resolution of enhancing metastases on the prior study. Electronically  Signed   By: Logan Bores M.D.   On: 02/10/2020 14:44   CT Abdomen Pelvis W Contrast  Result Date: 02/07/2020 CLINICAL DATA:  Restaging metastatic lung cancer (poorly differentiated adenocarcinoma). EXAM: CT CHEST, ABDOMEN, AND PELVIS WITH CONTRAST TECHNIQUE: Multidetector CT imaging of the chest, abdomen and pelvis was performed following the standard protocol during bolus administration of intravenous contrast. CONTRAST:  143m OMNIPAQUE IOHEXOL 300 MG/ML  SOLN COMPARISON:  Prior CTs 11/16/2019 and 08/22/2019. FINDINGS: CT CHEST FINDINGS Neck findings are dictated separately. Cardiovascular: Right IJ Port-A-Cath extends to the mid right atrial level. No acute vascular findings are seen. There is mild extrinsic compression of the pulmonary arteries centrally by the enlarging central mediastinal process. There is mild aortic atherosclerosis. The heart size is stable. There is a stable small pericardial effusion. Mediastinum/Nodes: There is progressive ill-defined confluent soft tissue in the AP window no suspicious for progressive adenopathy. This measures up to 5.0 x 3.3 cm on image 21/2. Some of this may reflect complex fluid in the superior pericardial recess, although the fluid more inferiorly around the heart appears relatively simple. The thyroid gland, trachea and esophagus demonstrate no significant findings. Lungs/Pleura: Stable trace left pleural effusion. No right pleural effusion or pneumothorax. There are innumerable pulmonary nodules bilaterally with further enlargement. Representative nodules include a right lower lobe nodule measuring 3.1 x 2.6 cm on image 86/7 (previously 1.6 cm maximally) and a 2.2 x 1.4 cm left lower lobe nodule on image 67/7 (previously 1.0 cm maximally). Chronic opacity medially at the left lung base is similar to the prior study, likely due to chronic atelectasis and/or scarring. Patchy peribronchovascular opacities in both lungs are not significantly changed, likely  treatment related. Musculoskeletal/Chest wall: There is a new lytic metastasis involving the right 6th rib posteriorly with an associated pathologic fracture (image 40/7). There are new lytic lesions within the spine, most prominent within the T9 vertebral body (image 78/6). This lesion measures up to 1.7 cm on axial image 36/2 and is associated with a small anterior epidural component, not expected to cause cord compression at this time. There are probable additional smaller lesions at T1 and T2. CT ABDOMEN AND PELVIS FINDINGS Hepatobiliary: New clustered low-density lesions posteriorly in the right hepatic lobe measuring up to 1.2 cm on image 46/2, suspicious for metastatic disease. No evidence of gallstones, gallbladder wall thickening or biliary dilatation. Pancreas: Unremarkable. No pancreatic ductal dilatation or surrounding inflammatory changes. Spleen: Normal in size without focal abnormality. Adrenals/Urinary Tract: Both adrenal glands appear normal. The kidneys appear normal without evidence of urinary tract calculus, suspicious lesion or hydronephrosis. No bladder abnormalities are seen. Stomach/Bowel: The stomach appears unremarkable for its degree of distension. No evidence of bowel wall thickening, distention or surrounding inflammatory change. Moderate stool throughout the colon. Vascular/Lymphatic: There are no enlarged abdominal or pelvic lymph nodes. Mild aortic and branch vessel atherosclerosis without acute vascular findings. Reproductive:  Hysterectomy.  No adnexal mass. Other: No ascites or peritoneal nodularity.  Intact abdominal wall. Musculoskeletal: There are few scattered new low-density osseous lesions most notably in the L5 vertebral body, upper sacrum and right iliac bone, suspicious for small metastases. No pathologic fracture. IMPRESSION: 1. Progressive pulmonary metastatic disease. 2. Progressive ill-defined confluent soft tissue in the AP window, suspicious for progressive  adenopathy. 3. New lytic lesions within the spine, right 6th rib and right iliac bone, consistent with metastatic disease. There is a small anterior epidural component associated with a lesion in the T9 vertebral body, not expected to cause cord compression at this time. 4. New clustered low-density lesions posteriorly in the right hepatic lobe, suspicious for metastatic disease. 5. Neck findings are dictated separately. 6. Aortic Atherosclerosis (ICD10-I70.0). Electronically Signed   By: Richardean Sale M.D.   On: 02/07/2020 17:08   DG Chest Portable 1 View  Result Date: 02/25/2020 CLINICAL DATA:  Hypoxia.  Metastatic lung cancer. EXAM: PORTABLE CHEST 1 VIEW COMPARISON:  02/18/2020 FINDINGS: Stable enlarged cardiac silhouette and right jugular porta catheter. No significant change in diffuse prominence of the interstitial markings and multiple bilateral lung nodules. High density material in the stool in the splenic flexure. No acute bony abnormality. IMPRESSION: 1. Stable cardiomegaly and diffuse interstitial lung disease. 2. Stable multiple bilateral lung nodules compatible with known metastases. Electronically Signed   By: Claudie Revering M.D.   On: 02/25/2020 13:56   DG Chest Port 1 View  Result Date: 02/18/2020 CLINICAL DATA:  Stage IV lung cancer EXAM: PORTABLE CHEST 1 VIEW COMPARISON:  CT chest dated 02/07/2020 FINDINGS: Patchy/masslike opacity in the left lower lobe with numerous bilateral pulmonary nodules, better evaluated on prior CT. Suspected small left pleural effusion. No pneumothorax. Cardiomegaly. Right chest power port terminates at the cavoatrial junction. IMPRESSION: Patchy/masslike opacity in the left lower lobe, better evaluated on prior CT. Numerous bilateral pulmonary metastases. Suspected small left pleural effusion. Electronically Signed   By: Julian Hy M.D.   On: 02/18/2020 16:15   ECHOCARDIOGRAM COMPLETE  Result Date: 02/19/2020    ECHOCARDIOGRAM REPORT   Patient Name:    NEA GITTENS Fishburn Date of Exam: 02/19/2020 Medical Rec #:  [id number]     Height:       66.0 in Accession #:    [id number]    Weight:       167.3 lb Date of Birth:  19-Jan-1966      BSA:          1.854 m Patient Age:    68 years      BP:           109/69 mmHg Patient Gender: F             HR:           102 bpm. Exam Location:  Inpatient Procedure: 2D Echo Indications:    Pulmonary Embolus I26.99  History:        Patient has prior history of Echocardiogram examinations, most                 recent 01/10/2017. Lung cancer, Signs/Symptoms:Dyspnea; Risk                 Factors:Hypertension.  Sonographer:    Mikki Santee RDCS (AE) Referring Phys: [place]  1. There is a false tendon in the mid LV cavity of no clinical significance. . Left ventricular ejection fraction, by estimation, is 60 to 65%. The left ventricle  has normal function. The left ventricle has no regional wall motion abnormalities. Left ventricular diastolic parameters were normal.  2. Right ventricular systolic function is normal. The right ventricular size is normal. Tricuspid regurgitation signal is inadequate for assessing PA pressure.  3. A small pericardial effusion is present. The pericardial effusion is localized mainly near the right atrium but also circumferential.  4. The mitral valve is normal in structure. Mild mitral valve regurgitation. No evidence of mitral stenosis.  5. The aortic valve is normal in structure. Aortic valve regurgitation is trivial. No aortic stenosis is present.  6. The inferior vena cava is normal in size with greater than 50% respiratory variability, suggesting right atrial pressure of 3 mmHg. FINDINGS  Left Ventricle: There is a false tendon in the mid LV cavity of no clinical significance. Left ventricular ejection fraction, by estimation, is 60 to 65%. The left ventricle has normal function. The left ventricle has no regional wall motion abnormalities. The left ventricular internal cavity size  was normal in size. There is no left ventricular hypertrophy. Left ventricular diastolic parameters were normal. Normal left ventricular filling pressure. Right Ventricle: The right ventricular size is normal. No increase in right ventricular wall thickness. Right ventricular systolic function is normal. Tricuspid regurgitation signal is inadequate for assessing PA pressure. Left Atrium: Left atrial size was normal in size. Right Atrium: Right atrial size was normal in size. Pericardium: A small pericardial effusion is present. The pericardial effusion is localized near the right atrium and circumferential. Mitral Valve: The mitral valve is normal in structure. Mild mitral valve regurgitation. No evidence of mitral valve stenosis. Tricuspid Valve: The tricuspid valve is normal in structure. Tricuspid valve regurgitation is trivial. No evidence of tricuspid stenosis. Aortic Valve: The aortic valve is normal in structure. Aortic valve regurgitation is trivial. No aortic stenosis is present. Pulmonic Valve: The pulmonic valve was normal in structure. Pulmonic valve regurgitation is trivial. No evidence of pulmonic stenosis. Aorta: The aortic root is normal in size and structure. Venous: The inferior vena cava is normal in size with greater than 50% respiratory variability, suggesting right atrial pressure of 3 mmHg. IAS/Shunts: No atrial level shunt detected by color flow Doppler.  LEFT VENTRICLE PLAX 2D LVIDd:         5.00 cm  Diastology LVIDs:         3.30 cm  LV e' medial:    8.38 cm/s LV PW:         0.80 cm  LV E/e' medial:  9.8 LV IVS:        0.70 cm  LV e' lateral:   8.05 cm/s LVOT diam:     2.00 cm  LV E/e' lateral: 10.2 LV SV:         43 LV SV Index:   23 LVOT Area:     3.14 cm  RIGHT VENTRICLE RV S prime:     11.20 cm/s TAPSE (M-mode): 1.4 cm LEFT ATRIUM           Index       RIGHT ATRIUM           Index LA diam:      3.40 cm 1.83 cm/m  RA Area:     12.40 cm LA Vol (A2C): 20.4 ml 11.00 ml/m RA Volume:    25.80 ml  13.92 ml/m LA Vol (A4C): 42.9 ml 23.14 ml/m  AORTIC VALVE LVOT Vmax:   96.00 cm/s LVOT Vmean:  68.000 cm/s LVOT VTI:  0.137 m  AORTA Ao Root diam: 3.10 cm MITRAL VALVE MV Area (PHT): 4.36 cm    SHUNTS MV Decel Time: 174 msec    Systemic VTI:  0.14 m MV E velocity: 82.30 cm/s  Systemic Diam: 2.00 cm MV A velocity: 56.10 cm/s MV E/A ratio:  1.47 Fransico Him MD Electronically signed by Fransico Him MD Signature Date/Time: 02/19/2020/11:52:09 AM    Final    VAS [place] LOWER EXTREMITY VENOUS (DVT)  Result Date: 02/19/2020  Lower Venous DVT Study Indications: Pulmonary embolism, and SOB.  Risk Factors: Cancer Brains Mets. Anticoagulation: Xarelto. Comparison Study: No previous lower ext. Performing Technologist: Vonzell Schlatter RVT  Examination Guidelines: A complete evaluation includes B-mode imaging, spectral Doppler, color Doppler, and power Doppler as needed of all accessible portions of each vessel. Bilateral testing is considered an integral part of a complete examination. Limited examinations for reoccurring indications may be performed as noted. The reflux portion of the exam is performed with the patient in reverse Trendelenburg.  +---------+---------------+---------+-----------+----------+--------------+ RIGHT    CompressibilityPhasicitySpontaneityPropertiesThrombus Aging +---------+---------------+---------+-----------+----------+--------------+ CFV      Full           Yes      Yes                                 +---------+---------------+---------+-----------+----------+--------------+ SFJ      Full                                                        +---------+---------------+---------+-----------+----------+--------------+ FV Prox  Full                                                        +---------+---------------+---------+-----------+----------+--------------+ FV Mid   Full                                                         +---------+---------------+---------+-----------+----------+--------------+ FV DistalFull                                                        +---------+---------------+---------+-----------+----------+--------------+ PFV      Full                                                        +---------+---------------+---------+-----------+----------+--------------+ POP      Full           Yes      Yes                                 +---------+---------------+---------+-----------+----------+--------------+  PTV      Full                                                        +---------+---------------+---------+-----------+----------+--------------+ PERO     Full                                                        +---------+---------------+---------+-----------+----------+--------------+   +---------+---------------+---------+-----------+----------+--------------+ LEFT     CompressibilityPhasicitySpontaneityPropertiesThrombus Aging +---------+---------------+---------+-----------+----------+--------------+ CFV      Full           Yes      Yes                                 +---------+---------------+---------+-----------+----------+--------------+ SFJ      Full                                                        +---------+---------------+---------+-----------+----------+--------------+ FV Prox  Full                                                        +---------+---------------+---------+-----------+----------+--------------+ FV Mid   Full                                                        +---------+---------------+---------+-----------+----------+--------------+ FV DistalFull                                                        +---------+---------------+---------+-----------+----------+--------------+ PFV      Full                                                         +---------+---------------+---------+-----------+----------+--------------+ POP      Full           Yes      Yes                                 +---------+---------------+---------+-----------+----------+--------------+ PTV      None  Acute          +---------+---------------+---------+-----------+----------+--------------+ PERO     None                                         Acute          +---------+---------------+---------+-----------+----------+--------------+  Summary: RIGHT: - There is no evidence of deep vein thrombosis in the lower extremity.  - No cystic structure found in the popliteal fossa.  LEFT: - Findings consistent with acute deep vein thrombosis involving the left posterior tibial veins, and left peroneal veins. - No cystic structure found in the popliteal fossa.  *See table(s) above for measurements and observations. Electronically signed by Deitra Mayo MD on 02/19/2020 at 4:28:15 PM.    Final     (Echo, Carotid, EGD, Colonoscopy, ERCP)    Subjective:  Patient resting in bed she is on 2 L of oxygen and not hypoxic since being on O2.  Denies any new complaints today.  Anxious to go home. Discharge Exam: Vitals:   02/26/20 0918 02/26/20 1355  BP: 120/72 108/76  Pulse: 96 (!) 107  Resp: 15 17  Temp: 99 F (37.2 C) 99.3 F (37.4 C)  SpO2: 96% 99%   Vitals:   02/26/20 0200 02/26/20 0658 02/26/20 0918 02/26/20 1355  BP: 119/70 126/71 120/72 108/76  Pulse:  (!) 107 96 (!) 107  Resp: 16 18 15 17   Temp: 98.9 F (37.2 C) 99.3 F (37.4 C) 99 F (37.2 C) 99.3 F (37.4 C)  TempSrc: Oral Oral Oral Oral  SpO2: 100% 99% 96% 99%  Weight:      Height:        General: Pt is alert, awake, not in acute distress Cardiovascular: RRR, S1/S2 +, no rubs, no gallops Respiratory: rhonchi bilaterally, no wheezing, no rhonchi Abdominal: Soft, NT, ND, bowel sounds + Extremities: no edema, no cyanosis    The results  of significant diagnostics from this hospitalization (including imaging, microbiology, ancillary and laboratory) are listed below for reference.     Microbiology: Recent Results (from the past 240 hour(s))  Culture, blood (Routine x 2)     Status: None   Collection Time: 02/18/20  3:27 PM   Specimen: BLOOD  Result Value Ref Range Status   Specimen Description   Final    BLOOD PORTA CATH Performed at [facility] [address], [place], [place] [address]    Special Requests   Final    BOTTLES DRAWN AEROBIC AND ANAEROBIC Blood Culture adequate volume Performed at [facility] [address], [place], [place] [address]    Culture   Final    NO GROWTH 5 DAYS Performed at [facility], [place] [address], [place], [place] [address]    Report Status 02/23/2020 FINAL  Final  Culture, blood (Routine x 2)     Status: None   Collection Time: 02/18/20  3:32 PM   Specimen: BLOOD  Result Value Ref Range Status   Specimen Description   Final    BLOOD LEFT ANTECUBITAL Performed at [facility] [address], [place], [name] [address]    Special Requests   Final    BOTTLES DRAWN AEROBIC AND ANAEROBIC Blood Culture results may not be optimal due to an excessive volume of blood received in culture bottles Performed at [facility] [address] [place],  [place] [address]    Culture   Final    NO GROWTH 5 DAYS Performed at [facility], [place] [address] [place], [place] [address]    Report Status 02/23/2020 FINAL  Final  SARS Coronavirus 2 by RT PCR (hospital order, performed in [facility] hospital lab) Nasopharyngeal Nasopharyngeal Swab     Status: None   Collection Time: 02/18/20  3:41 PM   Specimen: Nasopharyngeal Swab  Result Value Ref Range Status   SARS Coronavirus 2 NEGATIVE NEGATIVE Final    Comment: (NOTE) SARS-CoV-2 target nucleic acids are NOT DETECTED.  The SARS-CoV-2 RNA is generally  detectable in upper and lower respiratory specimens during the acute phase of infection. The lowest concentration of SARS-CoV-2 viral copies this assay can detect is 250 copies / mL. A negative result does not preclude SARS-CoV-2 infection and should not be used as the sole basis for treatment or other patient management decisions.  A negative result may occur with improper specimen collection / handling, submission of specimen other than nasopharyngeal swab, presence of viral mutation(s) within the areas targeted by this assay, and inadequate number of viral copies (<250 copies / mL). A negative result must be combined with clinical observations, patient history, and epidemiological information.  Fact Sheet for Patients:   StrictlyIdeas.no  Fact Sheet for Healthcare Providers: BankingDealers.co.za  This test is not yet approved or  cleared by the [place] FDA and has been authorized for detection and/or diagnosis of SARS-CoV-2 by FDA under an Emergency Use Authorization (EUA).  This EUA will remain in effect (meaning this test can be used) for the duration of the COVID-19 declaration under Section 564(b)(1) of the Act, 21 U.S.C. section 360bbb-3(b)(1), unless the authorization is terminated or revoked sooner.  Performed at [facility], [place] [address] [place], [place] [address]   SARS CORONAVIRUS 2 (TAT 6-24 HRS) Nasopharyngeal Nasopharyngeal Swab     Status: None   Collection Time: 02/25/20  3:09 PM   Specimen: Nasopharyngeal Swab  Result Value Ref Range Status   SARS Coronavirus 2 NEGATIVE NEGATIVE Final    Comment: (NOTE) SARS-CoV-2 target nucleic acids are NOT DETECTED.  The SARS-CoV-2 RNA is generally detectable in upper and lower respiratory specimens during the acute phase of infection. Negative results do not preclude SARS-CoV-2 infection, do not rule out co-infections with other pathogens, and  should not be used as the sole basis for treatment or other patient management decisions. Negative results must be combined with clinical observations, patient history, and epidemiological information. The expected result is Negative.  Fact Sheet for Patients: SugarRoll.be  Fact Sheet for Healthcare Providers: https://www.woods-.com/  This test is not yet approved or cleared by the [place] FDA and  has been authorized for detection and/or diagnosis of SARS-CoV-2 by FDA under an Emergency Use Authorization (EUA). This EUA will remain  in effect (meaning this test can be used) for the duration of the COVID-19 declaration under Se ction 564(b)(1) of the Act, 21 U.S.C. section 360bbb-3(b)(1), unless the authorization is terminated or revoked sooner.  Performed at [facility], [place] [address] [place], [place] [address]      Labs: BNP (last 3 results) No results for input(s): BNP in the last 8760 hours. Basic Metabolic Panel: Recent Labs  Lab 02/21/20 0318 02/22/20 1020 02/25/20 1314  NA 137 136 137  K 3.4* 3.2* 3.7  CL 98 96* 95*  CO2 27 30 29   GLUCOSE 96 122* 103*  BUN 7  8 7  CREATININE 0.86 0.91 0.84  CALCIUM 8.9 9.0 9.3   Liver Function Tests: No results for input(s): AST, ALT, ALKPHOS, BILITOT, PROT, ALBUMIN in the last 168 hours. No results for input(s): LIPASE, AMYLASE in the last 168 hours. No results for input(s): AMMONIA in the last 168 hours. CBC: Recent Labs  Lab 02/20/20 0330 02/20/20 1530 02/21/20 1101 02/22/20 0400 02/25/20 1314  WBC 11.9*  --  12.7* 11.2* 13.8*  NEUTROABS  --   --   --   --  9.3*  HGB 6.9* 8.5* 8.8* 7.8* 8.5*  HCT 21.6* 26.2* 28.2* 24.5* 26.7*  MCV 105.9*  --  103.7* 103.4* 104.7*  PLT 238  --  302 301 474*   Cardiac Enzymes: No results for input(s): CKTOTAL, CKMB, CKMBINDEX, TROPONINI in the last 168 hours. BNP: Invalid input(s): POCBNP CBG: No results for  input(s): GLUCAP in the last 168 hours. D-Dimer No results for input(s): DDIMER in the last 72 hours. Hgb A1c No results for input(s): HGBA1C in the last 72 hours. Lipid Profile No results for input(s): CHOL, HDL, LDLCALC, TRIG, CHOLHDL, LDLDIRECT in the last 72 hours. Thyroid function studies No results for input(s): TSH, T4TOTAL, T3FREE, THYROIDAB in the last 72 hours.  Invalid input(s): FREET3 Anemia work up Recent Labs    02/26/20 1150  FERRITIN 683*  TIBC 140*  IRON 13*   Urinalysis    Component Value Date/Time   COLORURINE YELLOW 03/15/2019 [place] 03/15/2019 1333   LABSPEC 1.020 03/15/2019 1333   PHURINE 5.5 03/15/2019 1333   GLUCOSEU 100 (A) 03/15/2019 1333   HGBUR NEGATIVE 03/15/2019 1333   BILIRUBINUR MODERATE (A) 03/15/2019 1333   KETONESUR NEGATIVE 03/15/2019 1333   PROTEINUR TRACE (A) 01/10/2020 1400   UROBILINOGEN 1.0 07/22/2010 2017   NITRITE POSITIVE (A) 03/15/2019 1333   LEUKOCYTESUR TRACE (A) 03/15/2019 1333   Sepsis Labs Invalid input(s): PROCALCITONIN,  WBC,  LACTICIDVEN Microbiology Recent Results (from the past 240 hour(s))  Culture, blood (Routine x 2)     Status: None   Collection Time: 02/18/20  3:27 PM   Specimen: BLOOD  Result Value Ref Range Status   Specimen Description   Final    BLOOD PORTA CATH Performed at [facility], [place] [address] [place], [place] [address]    Special Requests   Final    BOTTLES DRAWN AEROBIC AND ANAEROBIC Blood Culture adequate volume Performed at [facility] [address], [place], [place] [address]    Culture   Final    NO GROWTH 5 DAYS Performed at [facility], [place] [address] [place], [place] [address]    Report Status 02/23/2020 FINAL  Final  Culture, blood (Routine x 2)     Status: None   Collection Time: 02/18/20  3:32 PM   Specimen: BLOOD  Result Value Ref Range Status   Specimen Description   Final    BLOOD LEFT  ANTECUBITAL Performed at [facility] [address], [place], [place] [address]    Special Requests   Final    BOTTLES DRAWN AEROBIC AND ANAEROBIC Blood Culture results may not be optimal due to an excessive volume of blood received in culture bottles Performed at [facility] [address], [place], [place] [address]    Culture   Final    NO GROWTH 5 DAYS Performed at [facility], [place] [address] [place],  [address]  Report Status 02/23/2020 FINAL  Final  SARS Coronavirus 2 by RT PCR (hospital order, performed in [facility] hospital lab) Nasopharyngeal Nasopharyngeal Swab     Status: None   Collection Time: 02/18/20  3:41 PM   Specimen: Nasopharyngeal Swab  Result Value Ref Range Status   SARS Coronavirus 2 NEGATIVE NEGATIVE Final    Comment: (NOTE) SARS-CoV-2 target nucleic acids are NOT DETECTED.  The SARS-CoV-2 RNA is generally detectable in upper and lower respiratory specimens during the acute phase of infection. The lowest concentration of SARS-CoV-2 viral copies this assay can detect is 250 copies / mL. A negative result does not preclude SARS-CoV-2 infection and should not be used as the sole basis for treatment or other patient management decisions.  A negative result may occur with improper specimen collection / handling, submission of specimen other than nasopharyngeal swab, presence of viral mutation(s) within the areas targeted by this assay, and inadequate number of viral copies (<250 copies / mL). A negative result must be combined with clinical observations, patient history, and epidemiological information.  Fact Sheet for Patients:   StrictlyIdeas.no  Fact Sheet for Healthcare Providers: BankingDealers.co.za  This test is not yet approved or  cleared by the [place] FDA and has been authorized for detection and/or diagnosis of SARS-CoV-2 by FDA under  an Emergency Use Authorization (EUA).  This EUA will remain in effect (meaning this test can be used) for the duration of the COVID-19 declaration under Section 564(b)(1) of the Act, 21 U.S.C. section 360bbb-3(b)(1), unless the authorization is terminated or revoked sooner.  Performed at [facility], [place] [address], [place], [place] [address]   SARS CORONAVIRUS 2 (TAT 6-24 HRS) Nasopharyngeal Nasopharyngeal Swab     Status: None   Collection Time: 02/25/20  3:09 PM   Specimen: Nasopharyngeal Swab  Result Value Ref Range Status   SARS Coronavirus 2 NEGATIVE NEGATIVE Final    Comment: (NOTE) SARS-CoV-2 target nucleic acids are NOT DETECTED.  The SARS-CoV-2 RNA is generally detectable in upper and lower respiratory specimens during the acute phase of infection. Negative results do not preclude SARS-CoV-2 infection, do not rule out co-infections with other pathogens, and should not be used as the sole basis for treatment or other patient management decisions. Negative results must be combined with clinical observations, patient history, and epidemiological information. The expected result is Negative.  Fact Sheet for Patients: SugarRoll.be  Fact Sheet for Healthcare Providers: https://www.woods-[place].com/  This test is not yet approved or cleared by the [place] FDA and  has been authorized for detection and/or diagnosis of SARS-CoV-2 by FDA under an Emergency Use Authorization (EUA). This EUA will remain  in effect (meaning this test can be used) for the duration of the COVID-19 declaration under Se ction 564(b)(1) of the Act, 21 U.S.C. section 360bbb-3(b)(1), unless the authorization is terminated or revoked sooner.  Performed at [facility], [place] [address], [place], [place] [address]      Time coordinating discharge:  39 minutes  SIGNED:   Georgette Shell, MD  [facility] 02/26/2020, 2:45 PM

## 2020-02-26 NOTE — Progress Notes (Signed)
Patient was taken to main entrance with O2 tank via wheelchair. DC instructions were given to patient and all questions were answered.

## 2020-02-26 NOTE — TOC Progression Note (Signed)
Transition of Care [facility]) - Progression Note    Patient Details  Name: Nancy Moore MRN: [id number] Date of Birth: [birth year]  Transition of [facility] CM/SW Contact  Joaquin Courts, RN Phone Number: 02/26/2020, 12:56 PM  Clinical Narrative:    [place] given referral for home oxygen.          Expected Discharge Plan and Services           Expected Discharge Date: 02/26/20               DME Arranged: Oxygen DME Agency: Other - Comment [facility] [facility]) Date DME Agency Contacted: 02/26/20 Time DME Agency Contacted: 0929 Representative spoke with at DME Agency: [place] ([place]) Interventions    Readmission Risk Interventions Readmission Risk Prevention Plan 02/20/2020  Transportation Screening Complete  PCP or Specialist Appt within 3-5 Days Complete  HRI or [place] Complete  Social Work Consult for [place] Planning/Counseling Complete  Palliative Care Screening Complete  Medication Review Press photographer) Complete  Some recent data might be hidden

## 2020-02-26 NOTE — Progress Notes (Signed)
SATURATION QUALIFICATIONS: (This note is used to comply with regulatory documentation for home oxygen)  Patient Saturations on Room Air at Rest = 90%  Patient Saturations on Room Air while Ambulating = 77%  Patient Saturations on 2 Liters of oxygen while Ambulating = 92%  Please briefly explain why patient needs home oxygen:pt dsat when walking

## 2020-02-26 NOTE — Plan of Care (Signed)

## 2020-02-26 NOTE — Progress Notes (Signed)
[facility] [address] [place], [place] [address] [phone]  Date:  02/29/2020   Name:  Nancy Moore   DOB:  1965/10/12   MRN:  [id number]  PCP:  Darreld Mclean, MD    Chief Complaint: No chief complaint on file.   History of Present Illness:  Nancy Moore is a 55 y.o. very pleasant female patient who presents with the following:  Patient here today for hospital follow-up.  Virtual visit today due to patient illness Connected with pt via video monitor Patient location is home, provider location is office.  Patient identity confirmed with 2 factors, she gives consent for virtual visit today.  The patient and myself are present on the call today  Unfortunately Nancy Moore has advanced lung cancer and has recently been admitted to the hospital twice She was admitted from January 22 to January 26 with febrile illness-diagnosed with a pulmonary embolism and started on Xarelto at that time  She then be presented to the ER on January 29 with shortness of breath requiring oxygen She was readmitted just overnight, was set up with home oxygen  I spoke with her on 1/31, patient had a home oxygen available and was able to keep her sats over 90%.  She was doing okay, but needed a bedside commode and also hospital bed  She is having some pain and is on pain medication- she does feel that her control is ok  She does need a refill of her oxycodone- I will take care of this for her as I have her on the phone.  Will update her oncologist who has written it for her previously   She does have a consult with [facility] tomorrow.  She is thinking about further treatment vs hospice at this time.    Her breathing is "ok"-  She is using her oxygen at 2L   Patient Active Problem List   Diagnosis Date Noted  . Hypoxia 02/25/2020  . Metastatic malignant neoplasm ([place])   . Pulmonary embolism ([place]) 02/18/2020  . Anemia 02/18/2020  . SIRS  (systemic inflammatory response syndrome) ([place]) 02/18/2020  . Visit for wound check 06/01/2019  . Infection due to Port-A-Cath 05/06/2019  . Port-A-Cath in place 03/24/2019  . Postoperative visit 03/16/2019  . Encounter for postoperative wound check 10/28/2018  . Pleural effusion 10/13/2018  . Brain metastases ([place]) 05/13/2018  . Primary malignant neoplasm of lung with metastasis to brain ([place]) 02/05/2018  . Cardiac/pericardial tamponade   . Chest tube in place   . Pain   . Acute blood loss anemia   . Palliative care encounter   . AKI (acute kidney injury) ([place]) 01/10/2017  . Hypotension 01/09/2017  . Essential hypertension 01/09/2017  . Hypokalemia 01/09/2017  . Dyspnea 01/09/2017  . Leukocytosis 01/09/2017  . Pleural effusion on right 01/08/2017  . Encounter for antineoplastic chemotherapy 11/20/2016  . Adenocarcinoma of left lung, stage 4 ([facility]) 10/28/2016  . Goals of care, counseling/discussion 10/28/2016  . Upper airway cough syndrome 10/16/2016  . Multiple pulmonary nodules 10/15/2016    Past Medical History:  Diagnosis Date  . Adenocarcinoma of left lung, stage 4 ([place]) 10/28/2016  . Anemia   . Arthritis   . Constipation   . Crohn's disease ([place])    in remission 35 years +  . Dyspnea    gets "winded"  . GERD (gastroesophageal reflux disease)   . Goals of care, counseling/discussion 10/28/2016  .  History of hiatal hernia    noted on CT 12/20  . Hypertension   . Pneumonia    walking pneumonia in the late 80's  . Recurrent pleural effusion on left     Past Surgical History:  Procedure Laterality Date  . ABDOMINAL HYSTERECTOMY     partial hysterectomy  . BREAST EXCISIONAL BIOPSY Left 20+ yrs ago   benign  . CHEST TUBE INSERTION Left 10/26/2018   Procedure: INSERTION PLEURAL DRAINAGE CATHETER;  Surgeon: Ivin Poot, MD;  Location: [facility];  Service: Thoracic;  Laterality: Left;  . COLONOSCOPY    . IR IMAGING GUIDED PORT INSERTION  07/04/2019  . PERICARDIAL  WINDOW N/A 01/10/2017   Procedure: PERICARDIAL WINDOW- SUB XYPHOID, RIGHT CHEST TUBE;  Surgeon: Ivin Poot, MD;  Location: [facility];  Service: Thoracic;  Laterality: N/A;  . PORT-A-CATH REMOVAL Left 05/10/2019   Procedure: REMOVAL PORT-A-CATH;  Surgeon: Ivin Poot, MD;  Location: [facility];  Service: Thoracic;  Laterality: Left;  . PORTACATH PLACEMENT Left 02/21/2019   Procedure: INSERTION PORT-A-CATH;  Surgeon: Ivin Poot, MD;  Location: [facility];  Service: Thoracic;  Laterality: Left;  . REMOVAL OF PLEURAL DRAINAGE CATHETER Left 02/21/2019   Procedure: REMOVAL OF PLEURAL DRAINAGE CATHETER;  Surgeon: Ivin Poot, MD;  Location: [facility];  Service: Thoracic;  Laterality: Left;  Marland Kitchen VIDEO BRONCHOSCOPY Bilateral 10/21/2016   Procedure: VIDEO BRONCHOSCOPY WITH FLUORO;  Surgeon: Tanda Rockers, MD;  Location: [facility];  Service: Cardiopulmonary;  Laterality: Bilateral;    Social History   Tobacco Use  . Smoking status: Never Smoker  . Smokeless tobacco: Never Used  Vaping Use  . Vaping Use: Never used  Substance Use Topics  . Alcohol use: Not Currently    Comment: socially  . Drug use: No    Family History  Problem Relation Age of Onset  . Asthma Mother   . Stroke Mother   . Hypertension Mother   . Heart attack Father   . Hypertension Father   . Hyperlipidemia Father   . Dementia Father   . Emphysema Maternal Grandmother   . Hypertension Maternal Grandmother   . Colon cancer Paternal Grandmother   . Brain cancer Maternal Uncle     Allergies  Allergen Reactions  . Percocet [Oxycodone-Acetaminophen] Other (See Comments)    "makes me dizzy" / takes TID at home  . Lisinopril Cough  . Losartan Cough    Medication list has been reviewed and updated.  Current Facility-Administered Medications on File Prior to Visit  Medication Dose Route Frequency Provider Last Rate Last Admin  . albuterol (VENTOLIN HFA) 108 (90 Base) MCG/ACT inhaler 2 puff  2 puff Inhalation Q6H PRN  Wynetta Fines T, MD      . Chlorhexidine Gluconate Cloth 2 % PADS 6 each  6 each Topical Daily Wynetta Fines T, MD      . fluticasone (FLONASE) 50 MCG/ACT nasal spray 2 spray  2 spray Each Nare Daily PRN Wynetta Fines T, MD      . folic acid (FOLVITE) tablet 1 mg  1 mg Oral Daily Wynetta Fines T, MD      . furosemide (LASIX) tablet 40 mg  40 mg Oral Daily Wynetta Fines T, MD      . guaiFENesin (MUCINEX) 12 hr tablet 600 mg  600 mg Oral BID PRN Wynetta Fines T, MD      . HYDROcodone-homatropine [facility]) 5-1.5 MG/5ML syrup 5 mL  5 mL Oral Q6H PRN [place],  Ralene Cork, MD      . magnesium oxide (MAG-OX) tablet 400 mg  400 mg Oral Daily Wynetta Fines T, MD      . methocarbamol (ROBAXIN) tablet 500 mg  500 mg Oral Q8H PRN Wynetta Fines T, MD      . ondansetron [facility]) tablet 8 mg  8 mg Oral Q8H PRN Wynetta Fines T, MD      . osimertinib mesylate (TAGRISSO) tablet 80 mg  80 mg Oral Daily Wynetta Fines T, MD      . oxyCODONE (Oxy IR/ROXICODONE) immediate release tablet 5 mg  5 mg Oral Q6H PRN Wynetta Fines T, MD      . pantoprazole (PROTONIX) EC tablet 40 mg  40 mg Oral Daily Wynetta Fines T, MD      . potassium chloride SA (KLOR-CON) CR tablet 20 mEq  20 mEq Oral BID Wynetta Fines T, MD   20 mEq at 02/25/20 2233  . prochlorperazine (COMPAZINE) tablet 10 mg  10 mg Oral Q6H PRN Wynetta Fines T, MD      . Rivaroxaban [facility] Reasons) tablet 15 mg  15 mg Oral BID WC Lequita Halt, MD   15 mg at 02/25/20 2037   Followed by  . [START ON 03/10/2020] rivaroxaban (XARELTO) tablet 20 mg  20 mg Oral Q supper Wynetta Fines T, MD      . sodium chloride flush (NS) 0.9 % injection 10-40 mL  10-40 mL Intracatheter Q12H Lequita Halt, MD   10 mL at 02/26/20 0131   Current Outpatient Medications on File Prior to Visit  Medication Sig Dispense Refill  . albuterol (VENTOLIN HFA) 108 (90 Base) MCG/ACT inhaler Inhale 2 puffs into the lungs every 6 (six) hours as needed for wheezing or shortness of breath. 8 g 1  . clindamycin (CLINDAGEL) 1 % gel Apply 1  application topically 2 (two) times daily. 30 g 1  . fentaNYL (DURAGESIC) 50 MCG/HR Place 1 patch onto the skin every 3 (three) days. 10 patch 0  . folic acid (FOLVITE) 1 MG tablet TAKE 1 TABLET BY MOUTH ONCE A DAY (Patient taking differently: Take 1 mg by mouth daily.) 30 tablet 4  . furosemide (LASIX) 40 MG tablet TAKE 1 TABLET BY MOUTH ONCE DAILY (Patient taking differently: Take 40 mg by mouth daily.) 30 tablet 3  . HYDROcodone-homatropine (HYCODAN) 5-1.5 MG/5ML syrup Take 5 mLs by mouth every 6 (six) hours as needed for cough. 120 mL 0  . lidocaine-prilocaine (EMLA) cream APPLY TO THE AFFECTED AREA(S) AS NEEDED (Patient taking differently: Apply 1 application topically daily as needed (chemo therapy port).) 30 g 0  . magnesium oxide (MAG-OX) 400 (241.3 Mg) MG tablet Take 1 tablet (400 mg total) by mouth daily.    . ondansetron (ZOFRAN) 8 MG tablet TAKE 1 TABLET BY MOUTH EVERY 8 HOURS AS NEEDED FOR NAUSEA OR VOMITING (Patient taking differently: Take 8 mg by mouth every 8 (eight) hours as needed for nausea or vomiting.) 30 tablet 2  . oxyCODONE (OXY IR/ROXICODONE) 5 MG immediate release tablet TAKE 1 TABLET BY MOUTH EVERY 6 HOURS AS NEEDED FOR PAIN (Patient taking differently: Take 5 mg by mouth every 6 (six) hours as needed for moderate pain.) 30 tablet 0  . pantoprazole (PROTONIX) 40 MG tablet Take 1 tablet (40 mg total) by mouth daily. 30 tablet 5  . potassium chloride SA (KLOR-CON) 20 MEQ tablet Take 1 tablet (20 mEq total) by mouth 2 (two) times daily. 60 tablet 5  .  prochlorperazine (COMPAZINE) 10 MG tablet Take 1 tablet (10 mg total) by mouth every 6 (six) hours as needed for nausea or vomiting. 30 tablet 2  . RIVAROXABAN (XARELTO) VTE STARTER PACK (15 & 20 MG) Follow package directions: Take one 15mg  tablet by mouth twice a day. On day 22, switch to one 20mg  tablet once a day. Take with food. (Patient taking differently: Take 15 mg by mouth 2 (two) times daily. Follow package directions: Take  one 15mg  tablet by mouth twice a day. On day 22, switch to one 20mg  tablet once a day. Take with food.) 51 each 0    Review of Systems:  As per HPI- otherwise negative.   Physical Examination: There were no vitals filed for this visit. There were no vitals filed for this visit. There is no height or weight on file to calculate BMI. Ideal Body Weight:    Spoke with patient over video today. She appears chronically ill with cancer, but comfortable in no distress Assessment and Plan: Adenocarcinoma of left lung, stage 4 (HCC) - Plan: oxyCODONE (OXY IR/ROXICODONE) 5 MG immediate release tablet, DISCONTINUED: oxyCODONE (OXY IR/ROXICODONE) 5 MG immediate release tablet, DISCONTINUED: oxyCODONE (OXY IR/ROXICODONE) 5 MG immediate release tablet  Oxygen desaturation  Pain - Plan: oxyCODONE (OXY IR/ROXICODONE) 5 MG immediate release tablet, DISCONTINUED: oxyCODONE (OXY IR/ROXICODONE) 5 MG immediate release tablet, DISCONTINUED: oxyCODONE (OXY IR/ROXICODONE) 5 MG immediate release tablet  Patient with advanced lung cancer, she is most likely at the end of her treatment options.  She plans to discuss her care with [facility], but unless they have a good option she will enter hospice care.  I have refilled her oxycodone today which she uses for breakthrough pain.  She is also using fentanyl patches.  She does feel that her pain is controlled  Nancy Moore and I expressed appreciation for each other today, it has been a pleasure being her primary care doctor and we are thinking of her during this time  Signed Lamar Blinks, MD

## 2020-02-27 ENCOUNTER — Telehealth: Payer: Self-pay

## 2020-02-27 ENCOUNTER — Telehealth: Payer: Self-pay | Admitting: Medical Oncology

## 2020-02-27 NOTE — Telephone Encounter (Signed)
Dr  Durenda Hurt- Pt has video visit today with Dr Durenda Hurt for [facility].   Hosp f/u with [facility].  Will wait until after visit today with Dr Durenda Hurt.  Continue tagrisso- I told her Dr Julien Nordmann said to finish the Tagrisso she has left.  Hospice - Pending pt decision .

## 2020-02-27 NOTE — Telephone Encounter (Signed)
Transition Care Management Follow-up Telephone Call  Date of discharge and from where: 02/26/20-[facility]  How have you been since you were released from the hospital? Better since starting Oxygen  Any questions or concerns? No  Items Reviewed:  Did the pt receive and understand the discharge instructions provided? Yes   Medications obtained and verified? Yes   Other? Yes   Any new allergies since your discharge? No   Dietary orders reviewed? Yes  Do you have support at home? Yes   Home Care and Equipment/Supplies: Were home health services ordered? no If so, what is the name of the agency? n/a  Has the agency set up a time to come to the patient's home? not applicable Were any new equipment or medical supplies ordered?  Yes: oxygen What is the name of the medical supply agency? Unsure of name Were you able to get the supplies/equipment? yes Do you have any questions related to the use of the equipment or supplies? No  Functional Questionnaire: (I = Independent and D = Dependent) ADLs: I with assitance  Bathing/Dressing- I with assistance  Meal Prep- D  Eating- I  Maintaining continence- I with assistance  Transferring/Ambulation- I with assistance  Managing Meds- D  Follow up appointments reviewed:   PCP Hospital f/u appt confirmed? Yes  Scheduled to see Dr. Lorelei Pont on 02/29/20 @ 10:20.  [facility] f/u appt confirmed? Yes  Scheduled to see Oncology on 03/20/20 @ 11:30.  Are transportation arrangements needed? No   If their condition worsens, is the pt aware to call PCP or go to the Emergency Dept.? Yes  Was the patient provided with contact information for the PCP's office or ED? Yes  Was to pt encouraged to call back with questions or concerns? Yes

## 2020-02-27 NOTE — Telephone Encounter (Signed)
Nurse Assessment Nurse: Mancel Bale, RN, Butch Penny Date/Time Eilene Ghazi Time): 02/25/2020 10:54:15 AM Confirm and document reason for call. If symptomatic, describe symptoms. ---Caller states her sister was discharged from the hospital for a PE. She is having some shortness of breath and her O2 levels are running from 85-88%. On RA. Does the patient have any new or worsening symptoms? ---Yes Will a triage be completed? ---Yes Related visit to physician within the last 2 weeks? ---Yes Does the PT have any chronic conditions? (i.e. diabetes, asthma, this includes High risk factors for pregnancy, etc.) ---Yes List chronic conditions. ---Stage 4 Lung Cancer, Anticoagulation Is the patient pregnant or possibly pregnant? (Ask all females between the ages of 35-55) ---No Is this a behavioral health or substance abuse call? ---No Guidelines Guideline Title Affirmed Question Affirmed Notes Nurse Date/Time (Eastern Time) Oxygen Monitoring and Hypoxia [1] MODERATE difficulty breathing AND [2] oxygen level (e.g., pulse oximetry) 91 to 94 percent Mancel Bale, RN, Butch Penny 02/25/2020 10:57:01 AM Disp. Time Eilene Ghazi Time) Disposition Final User 02/25/2020 10:52:23 AM Send to Urgent Otila Back, Kendra Opitz PLEASE NOTE: All timestamps contained within this report are represented as [place] Standard Time. CONFIDENTIALTY NOTICE: This fax transmission is intended only for the addressee. It contains information that is legally privileged, confidential or otherwise protected from use or disclosure. If you are not the intended recipient, you are strictly prohibited from reviewing, disclosing, copying using or disseminating any of this information or taking any action in reliance on or regarding this information. If you have received this fax in error, please notify us immediately by telephone so that we can arrange for its return to [place]. Phone: [phone], Toll-Free: [phone], Fax: [phone] Page: 2 of 2 Call Id:  [id number] Angie. Time Eilene Ghazi Time) Disposition Final User 02/25/2020 11:08:40 AM 911 Outcome Documentation Mancel Bale, RN, Butch Penny Reason: Caller did not answer 02/25/2020 11:04:40 AM Call EMS 911 Now Yes Mancel Bale, RN, Butch Penny Disposition Overriden: Go to ED Now Override Reason: Patient's symptoms need a higher level of care Caller Disagree/Comply Comply Caller Understands Yes PreDisposition Did not know what to do Care Advice Given Per Guideline CALL EMS 911 NOW: * Immediate medical attention is needed. You need to hang up and call 911 (or an ambulance). * Triager Discretion: I'll call you back in a few minutes to be sure you were able to reach them. Comments User: Marijo Conception, RN Date/Time Eilene Ghazi Time): 02/25/2020 11:04:38 AM Call has PE, Stage 4 lung cancer, and when she stands up to move her O2 levels drop into the low 80's and she experiences worsening shortness of breath. User: Marijo Conception, RN Date/Time Eilene Ghazi Time): 02/25/2020 11:07:09 AM O2 level was between 88-91% during triage questions. User: Ave Filter, RN Date/Time Eilene Ghazi Time): 02/25/2020 11:45:43 AM Caller called back and states that her sister is not wanting to go to the ED. Her O2 sat is 94%. Caller informed that triager agrees with triage outcome. Caller verbalizes understanding and states that she will let her sister know. She is not sure if her sister will go to the ED or not

## 2020-02-27 NOTE — Telephone Encounter (Signed)
Called pt - she has oxygen at home and is using 2-4 liters.  I advised her to titrate oxygen saturation of at least 92% She has not yet received her bedside commode, and also needs a hospital bed.  It sounds like they are moving towards hospice care The patient gives me an updated delivery address:  [address] [address] [phone]  Called [facility] and will send in an rx for the hospital bed as well  [phone]= fax number  Patient cell phone [phone]

## 2020-02-28 ENCOUNTER — Encounter: Payer: Self-pay | Admitting: Family Medicine

## 2020-02-28 DIAGNOSIS — Z7901 Long term (current) use of anticoagulants: Secondary | ICD-10-CM | POA: Diagnosis not present

## 2020-02-28 DIAGNOSIS — C3492 Malignant neoplasm of unspecified part of left bronchus or lung: Secondary | ICD-10-CM | POA: Diagnosis not present

## 2020-02-28 DIAGNOSIS — M6281 Muscle weakness (generalized): Secondary | ICD-10-CM | POA: Diagnosis not present

## 2020-02-28 DIAGNOSIS — I82402 Acute embolism and thrombosis of unspecified deep veins of left lower extremity: Secondary | ICD-10-CM | POA: Diagnosis not present

## 2020-02-28 DIAGNOSIS — C7931 Secondary malignant neoplasm of brain: Secondary | ICD-10-CM | POA: Diagnosis not present

## 2020-02-28 DIAGNOSIS — I2699 Other pulmonary embolism without acute cor pulmonale: Secondary | ICD-10-CM | POA: Diagnosis not present

## 2020-02-28 DIAGNOSIS — R6510 Systemic inflammatory response syndrome (SIRS) of non-infectious origin without acute organ dysfunction: Secondary | ICD-10-CM | POA: Diagnosis not present

## 2020-02-28 DIAGNOSIS — D649 Anemia, unspecified: Secondary | ICD-10-CM | POA: Diagnosis not present

## 2020-02-28 NOTE — Telephone Encounter (Signed)
Patient is calling requesting that rx sent to [facility] indicates that  Her home address and not her PO BOX, Patient would also like to received a large bedside toilet, patient states the bedside toilets in hospital pinched her. Patient states please send a large bed as well.  Please advise

## 2020-02-28 NOTE — Telephone Encounter (Signed)
New letters sent to [facility].

## 2020-02-29 ENCOUNTER — Other Ambulatory Visit: Payer: Self-pay

## 2020-02-29 ENCOUNTER — Other Ambulatory Visit (HOSPITAL_COMMUNITY): Payer: Self-pay | Admitting: Family Medicine

## 2020-02-29 ENCOUNTER — Telehealth (INDEPENDENT_AMBULATORY_CARE_PROVIDER_SITE_OTHER): Payer: Medicare Other | Admitting: Family Medicine

## 2020-02-29 DIAGNOSIS — C3492 Malignant neoplasm of unspecified part of left bronchus or lung: Secondary | ICD-10-CM | POA: Diagnosis not present

## 2020-02-29 DIAGNOSIS — R52 Pain, unspecified: Secondary | ICD-10-CM | POA: Diagnosis not present

## 2020-02-29 DIAGNOSIS — R0902 Hypoxemia: Secondary | ICD-10-CM

## 2020-02-29 MED ORDER — OXYCODONE HCL 5 MG PO TABS: 5.0000 mg | ORAL_TABLET | Freq: Four times a day (QID) | ORAL | 0 refills | Status: DC | PRN

## 2020-02-29 MED ORDER — OXYCODONE HCL 5 MG PO TABS: 5.0000 mg | ORAL_TABLET | Freq: Four times a day (QID) | ORAL | 0 refills | Status: AC | PRN

## 2020-02-29 MED FILL — oxyCODONE HCL 5 MG TABS: 5 | 7 days supply | Qty: 30 | Fill #0

## 2020-02-29 NOTE — Telephone Encounter (Signed)
Pt would like to add a hospital bed side tray that goes along with the hospital. The tray is to aide patient in eating and writing if she needs to write something.

## 2020-03-01 DIAGNOSIS — C7951 Secondary malignant neoplasm of bone: Secondary | ICD-10-CM | POA: Diagnosis not present

## 2020-03-01 DIAGNOSIS — I700 Atherosclerosis of aorta: Secondary | ICD-10-CM | POA: Diagnosis not present

## 2020-03-01 DIAGNOSIS — Z7189 Other specified counseling: Secondary | ICD-10-CM | POA: Diagnosis not present

## 2020-03-01 DIAGNOSIS — C3490 Malignant neoplasm of unspecified part of unspecified bronchus or lung: Secondary | ICD-10-CM | POA: Diagnosis not present

## 2020-03-01 DIAGNOSIS — Z79899 Other long term (current) drug therapy: Secondary | ICD-10-CM | POA: Diagnosis not present

## 2020-03-01 DIAGNOSIS — Z515 Encounter for palliative care: Secondary | ICD-10-CM | POA: Diagnosis not present

## 2020-03-01 DIAGNOSIS — C3492 Malignant neoplasm of unspecified part of left bronchus or lung: Secondary | ICD-10-CM | POA: Diagnosis not present

## 2020-03-02 ENCOUNTER — Telehealth: Payer: Self-pay | Admitting: *Deleted

## 2020-03-02 NOTE — Telephone Encounter (Signed)
Letter faxed.

## 2020-03-02 NOTE — Telephone Encounter (Signed)
Nancy Moore and her sister called. States that she has decided not to take anymore chemo. She is strongly considering hospice. They are going to talk over weekend.    PCP was working on getting her a hospital bed.   Barb: She is paying out of pocket for [employer]- is asking if they can get a prescription or coupons to help.

## 2020-03-05 ENCOUNTER — Encounter: Payer: Self-pay | Admitting: Family Medicine

## 2020-03-06 ENCOUNTER — Telehealth: Payer: Self-pay | Admitting: Medical Oncology

## 2020-03-06 ENCOUNTER — Telehealth: Payer: Self-pay | Admitting: Nutrition

## 2020-03-06 MED FILL — FUROSEMIDE 40 MG TAB: 40 | 30 days supply | Qty: 30 | Fill #1

## 2020-03-06 MED FILL — FOLIC ACID 1 MG TABS: 1 | 30 days supply | Qty: 30 | Fill #1

## 2020-03-06 NOTE — Telephone Encounter (Signed)
Patient requesting information about peach boost.  I called patient.  Explained that peach boost was not available in the retail stores.  This product would need to be ordered through a pharmacy.  Prescriptions and coupons generally do not help.  Suggested she contact [place] as well as checking prices on [employer] to see where she could purchase this for the least amount of money.  Patient was appreciative.  She has no other questions or concerns.

## 2020-03-06 NOTE — Telephone Encounter (Signed)
Fever 101 f last night . She took tylenol and " the fever broke".  Nancy Moore said that [facility] told her she wants  Hospice services.    Pt referred to Hospice.

## 2020-03-06 NOTE — Telephone Encounter (Signed)
Pt said she does want hospice. I told her referral was mad. Marlana Salvage is able to obtain  peach boost at [place].  She expressed her thanks to all of Dr Worthy Flank team.

## 2020-03-07 ENCOUNTER — Telehealth: Payer: Self-pay | Admitting: Family Medicine

## 2020-03-07 NOTE — Telephone Encounter (Signed)
Caller: [facility]) Call back # [phone] ext [phone] Fax # [phone]  [facility] received an order for a hospital bed they need the following information  Height and weight Office notes from 02/27/20

## 2020-03-07 NOTE — Telephone Encounter (Signed)
OV note, weight and height faxed to [place].

## 2020-03-09 ENCOUNTER — Other Ambulatory Visit (HOSPITAL_COMMUNITY): Payer: Self-pay | Admitting: Internal Medicine

## 2020-03-09 MED FILL — IPRAT-ALBUT 0.5-3(2.5) MG/3: 0.5-2.5 (3) | 15 days supply | Qty: 180 | Fill #0

## 2020-03-09 MED FILL — FAMOTIDINE 20 MG TABLET: 20 | 30 days supply | Qty: 60 | Fill #0

## 2020-03-09 MED FILL — SM CHEST CONGEST RLF DM CAP: 20-400 | 8 days supply | Qty: 50 | Fill #0

## 2020-03-09 MED FILL — STOOL SOFTENER/LAXATIVE 50-: 50-8.6 | 30 days supply | Qty: 60 | Fill #0

## 2020-03-14 ENCOUNTER — Other Ambulatory Visit (HOSPITAL_COMMUNITY): Payer: Self-pay | Admitting: Internal Medicine

## 2020-03-14 MED FILL — HALOPERIDOL 5 MG TABS: 5 | 7 days supply | Qty: 45 | Fill #0

## 2020-03-16 ENCOUNTER — Inpatient Hospital Stay: Admit: 2020-03-16 | Payer: Medicare Other

## 2020-03-19 ENCOUNTER — Inpatient Hospital Stay: Payer: Medicare Other | Attending: Oncology

## 2020-03-20 ENCOUNTER — Ambulatory Visit: Payer: Medicare Other | Admitting: Radiation Oncology

## 2020-03-21 ENCOUNTER — Encounter: Payer: Self-pay | Admitting: Family Medicine

## 2020-03-26 DIAGNOSIS — J9601 Acute respiratory failure with hypoxia: Secondary | ICD-10-CM | POA: Diagnosis not present

## 2020-03-27 DEATH — deceased

## 2020-04-20 ENCOUNTER — Other Ambulatory Visit (HOSPITAL_COMMUNITY): Payer: Self-pay

## 2021-01-23 IMAGING — MR MR HEAD WO/W CM
9 of 11 series · 27 of 48 positions shown · IV contrast (gadavist)
Comparison: 01/05/2018

CLINICAL DATA: Followup brain metastases

EXAM:
MRI HEAD WITHOUT AND WITH CONTRAST
TECHNIQUE: Multiplanar, multiecho pulse sequences of the brain and surrounding
structures were obtained without and with intravenous contrast.
CONTRAST:  10 cc Gadavist

[Series 3: FLAIR · sagittal · 3.0mm · 0.47mm/px · 3 of 48 slices shown (1 of 3)]
[im 1/48]
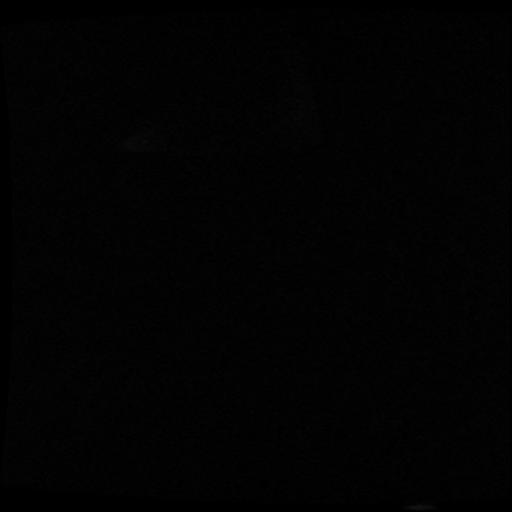
[im 24/48]
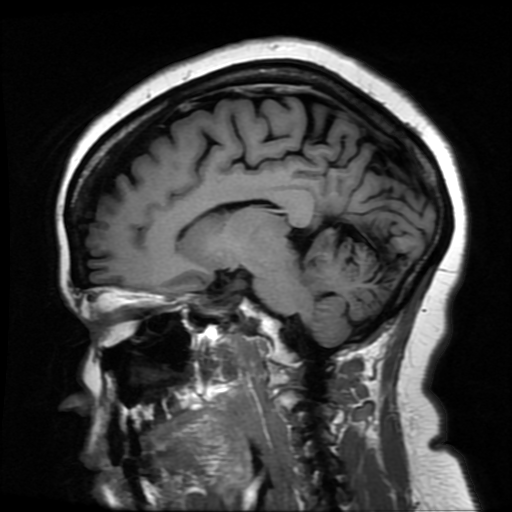
[im 48/48]
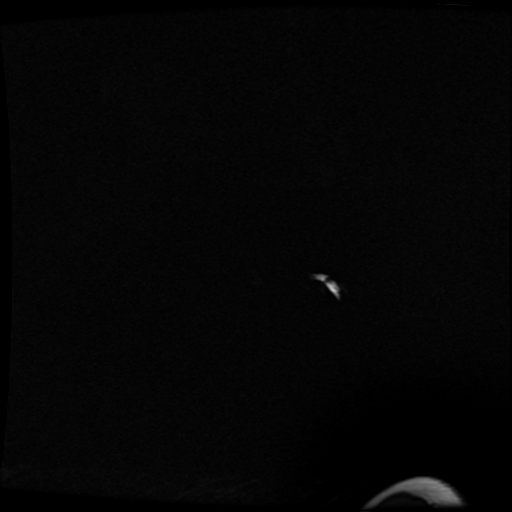

[Series 6: DWI · axial · 3.0mm · 0.94mm/px · z∈[-112,+74]mm · 7 of 125 slices shown]
[im 1/125]
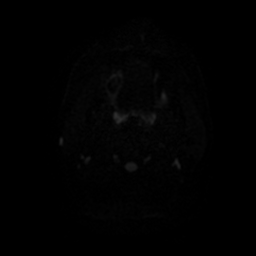
[im 21/125]
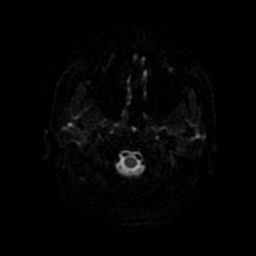
[im 42/125]
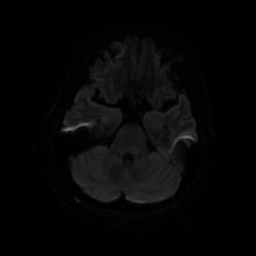
[im 63/125]
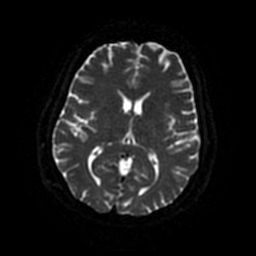
[im 83/125]
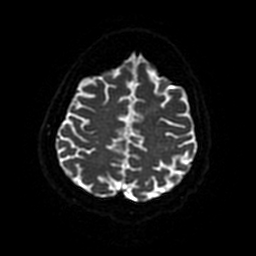
[im 104/125]
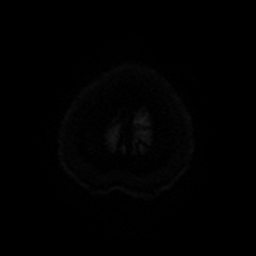
[im 125/125]
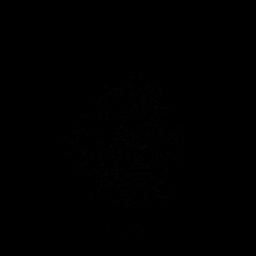

[Series 7: T2 · axial · 5.0mm · 0.47mm/px · z∈[-118,+74]mm · 2 of 33 slices shown]
[im 1/33]
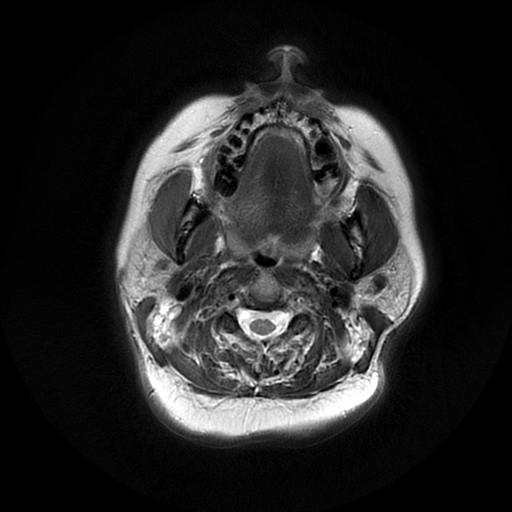
[im 33/33]
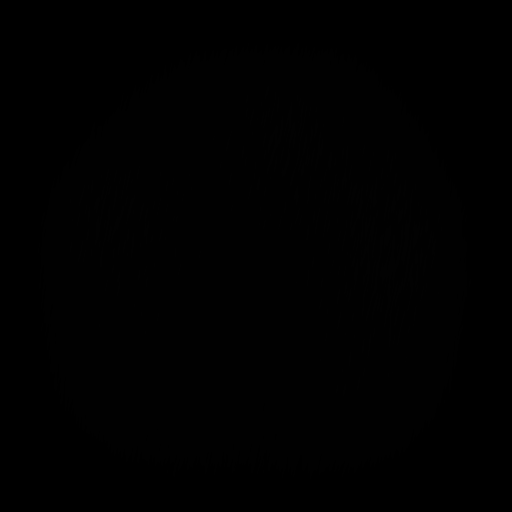

[Series 8: FLAIR · axial · 3.0mm · 0.41mm/px · z∈[-115,+74]mm · 4 of 64 slices shown (2 of 3)]
[im 1/64]
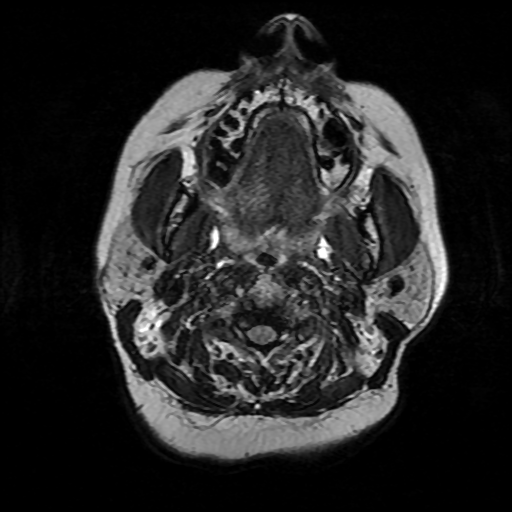
[im 22/64]
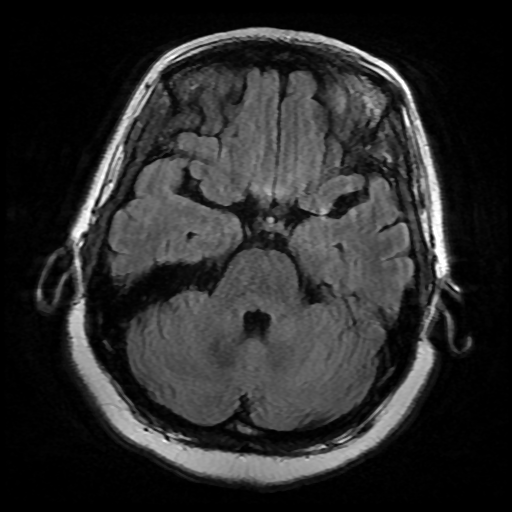
[im 43/64]
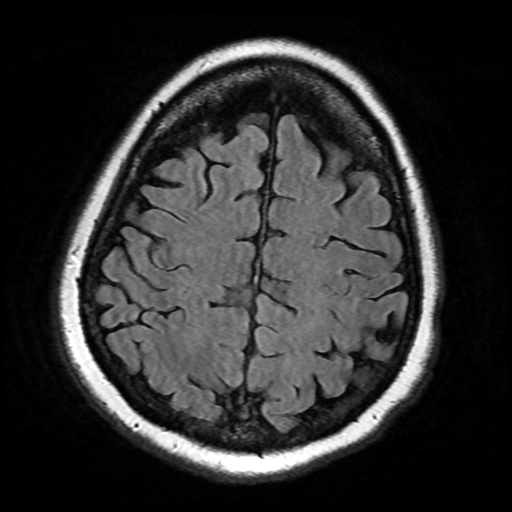
[im 64/64]
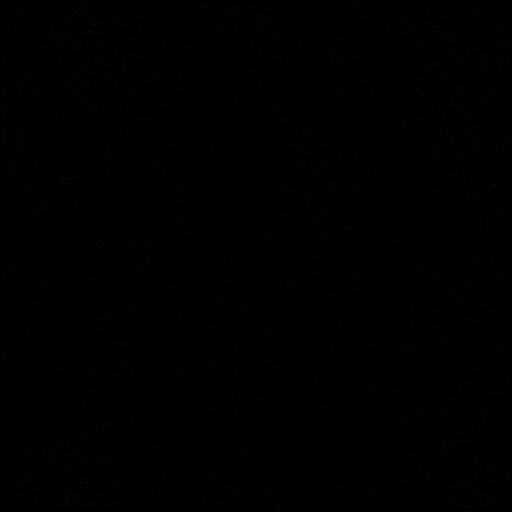

[Series 10: GRE · axial · 4.0mm · 0.43mm/px · z∈[-119,+76]mm · 2 of 40 slices shown]
[im 1/40]
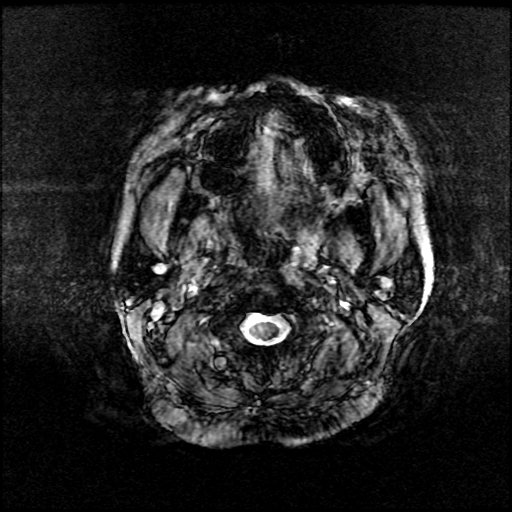
[im 40/40]
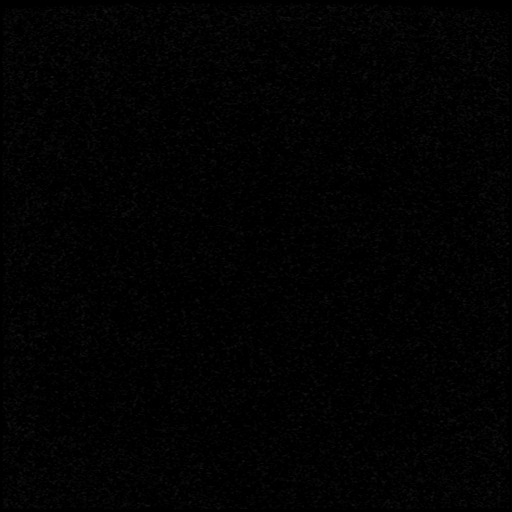

[Series 12: T2 post-contrast · coronal · 3.0mm · 0.43mm/px · 2 of 42 slices shown]
[im 1/42]
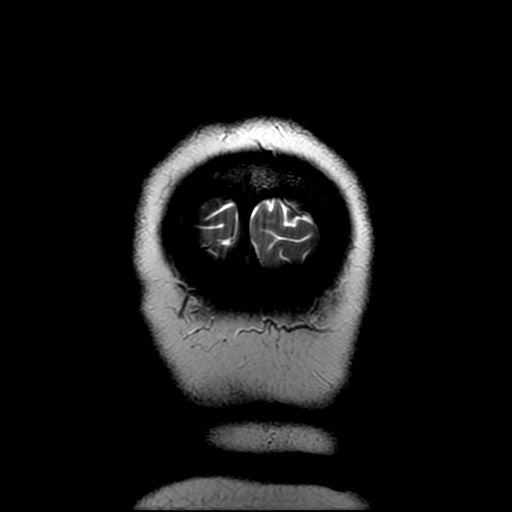
[im 42/42]
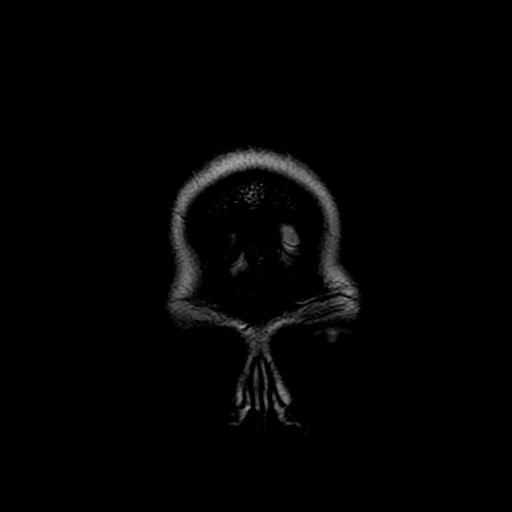

[Series 14: T1 · coronal · 3.0mm · 0.43mm/px · 1 of 42 slices shown]
[im 1/42]
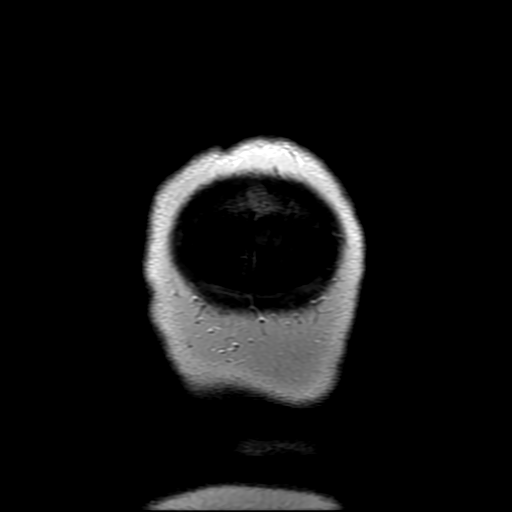

[Series 15: FLAIR · sagittal · 3.0mm · 0.47mm/px · 2 of 35 slices shown (3 of 3)]
[im 1/35]
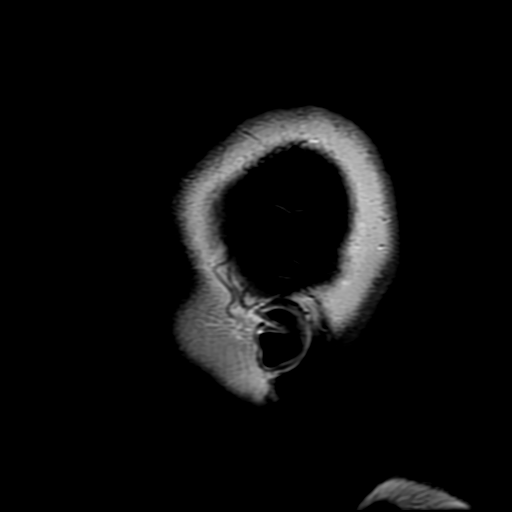
[im 35/35]
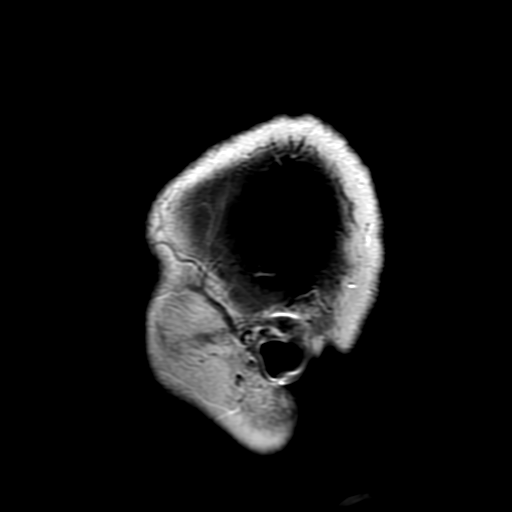

[Series 650: ADC · axial · 3.0mm · 0.94mm/px · z∈[-112,+74]mm · 4 of 63 slices shown]
[im 1/63]
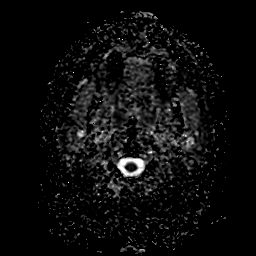
[im 21/63]
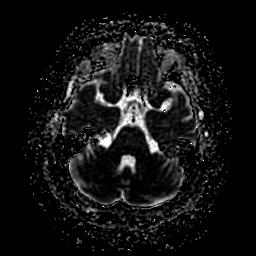
[im 42/63]
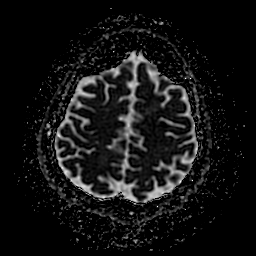
[im 63/63]
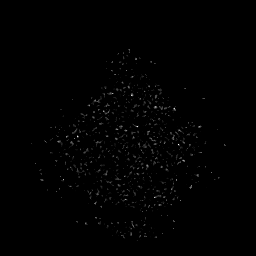

[27 of 48 positions shown; findings below may reference images not displayed]

FINDINGS: Brain: There has been a favorable response to therapy. Innumerable
subcentimeter metastatic lesions demonstrated on 01/05/2018 are
largely now invisible except for numerous foci of hemosiderin
deposition on susceptibility weighted imaging. There are several
minimal residual enhancing foci of measuring a few mm in size
towards the vertex on both sides. There is no new or enlarging
lesion. There is no vasogenic edema or mass effect. No
hydrocephalus.

Vascular: Major vessels at the base of the brain show flow.

Skull and upper cervical spine: Negative

Sinuses/Orbits: Clear/normal

Other: None
IMPRESSION: Favorable response to therapy. Innumerable subcentimeter brain
metastases have all responded. Many no longer show T2 signal or
contrast enhancement. Susceptibility weighted imaging shows foci of
hemosiderin deposition at the location of most of the treated
lesions. A few the lesions continue to show minimal contrast
enhancement, 3 mm or less, particularly towards the vertex. There
are no new or enlarging lesions. No mass effect or edema.

## 2022-12-18 NOTE — Telephone Encounter (Signed)
Telephone call
